# Patient Record
Sex: Female | Born: 1940
Health system: Southern US, Community
[De-identification: ages and names within clinical notes are randomized; demographics above are authoritative.]

## PROBLEM LIST (undated history)

## (undated) DIAGNOSIS — E785 Hyperlipidemia, unspecified: Secondary | ICD-10-CM

## (undated) DIAGNOSIS — K219 Gastro-esophageal reflux disease without esophagitis: Secondary | ICD-10-CM

## (undated) DIAGNOSIS — E669 Obesity, unspecified: Secondary | ICD-10-CM

## (undated) DIAGNOSIS — M1990 Unspecified osteoarthritis, unspecified site: Secondary | ICD-10-CM

## (undated) DIAGNOSIS — C801 Malignant (primary) neoplasm, unspecified: Secondary | ICD-10-CM

## (undated) DIAGNOSIS — I10 Essential (primary) hypertension: Secondary | ICD-10-CM

## (undated) DIAGNOSIS — H409 Unspecified glaucoma: Secondary | ICD-10-CM

## (undated) DIAGNOSIS — E039 Hypothyroidism, unspecified: Secondary | ICD-10-CM

## (undated) DIAGNOSIS — H269 Unspecified cataract: Secondary | ICD-10-CM

## (undated) HISTORY — DX: Obesity, unspecified: E66.9

## (undated) HISTORY — DX: Hyperlipidemia, unspecified: E78.5

## (undated) HISTORY — DX: Unspecified osteoarthritis, unspecified site: M19.90

## (undated) HISTORY — PX: THYROIDECTOMY, PARTIAL: SHX18

## (undated) HISTORY — DX: Gastro-esophageal reflux disease without esophagitis: K21.9

## (undated) HISTORY — DX: Unspecified cataract: H26.9

## (undated) HISTORY — DX: Hypothyroidism, unspecified: E03.9

## (undated) HISTORY — DX: Unspecified glaucoma: H40.9

## (undated) HISTORY — DX: Essential (primary) hypertension: I10

## (undated) HISTORY — PX: APPENDECTOMY: SHX54

## (undated) HISTORY — PX: CHOLECYSTECTOMY: SHX55

---

## 2001-06-16 ENCOUNTER — Ambulatory Visit (HOSPITAL_COMMUNITY): Admission: RE | Admit: 2001-06-16 | Discharge: 2001-06-16 | Payer: Self-pay | Admitting: Internal Medicine

## 2001-06-16 ENCOUNTER — Encounter: Payer: Self-pay | Admitting: Internal Medicine

## 2004-08-21 ENCOUNTER — Ambulatory Visit (HOSPITAL_COMMUNITY): Admission: RE | Admit: 2004-08-21 | Discharge: 2004-08-21 | Payer: Self-pay | Admitting: Internal Medicine

## 2007-10-10 ENCOUNTER — Ambulatory Visit (HOSPITAL_COMMUNITY): Admission: RE | Admit: 2007-10-10 | Discharge: 2007-10-10 | Payer: Self-pay | Admitting: Internal Medicine

## 2009-08-26 ENCOUNTER — Ambulatory Visit: Payer: Self-pay | Admitting: Orthopedic Surgery

## 2009-08-26 DIAGNOSIS — M7660 Achilles tendinitis, unspecified leg: Secondary | ICD-10-CM | POA: Insufficient documentation

## 2009-08-27 ENCOUNTER — Encounter: Payer: Self-pay | Admitting: Orthopedic Surgery

## 2009-09-10 ENCOUNTER — Encounter (HOSPITAL_COMMUNITY): Admission: RE | Admit: 2009-09-10 | Discharge: 2009-10-10 | Payer: Self-pay | Admitting: Orthopedic Surgery

## 2009-09-22 ENCOUNTER — Encounter: Payer: Self-pay | Admitting: Orthopedic Surgery

## 2009-11-26 ENCOUNTER — Encounter: Payer: Self-pay | Admitting: Orthopedic Surgery

## 2010-03-10 NOTE — Letter (Signed)
Summary: *Orthopedic Consult Note  [facility]  [address] [place] [address]  [place], [place] [address]   Phone: [phone]  Fax: [phone]    Re:    Kim Mills DOB:    [birth year]   Dear: DR Felecia Shelling   Thank you for requesting that we see the above patient for consultation.  A copy of the detailed office note will be sent under separate cover, for your review.  Evaluation today is consistent with:  1)  ACHILLES TENDINITIS, BILATERAL (ICD-726.71)     Our recommendation is for: Physical therapy        Thank you for this opportunity to look after your patient.  Sincerely,   Terrance Mass. MD.

## 2010-03-10 NOTE — Letter (Signed)
Summary: History form  History form   Imported By: Jacklynn Ganong 08/28/2009 10:19:06  _____________________________________________________________________  External Attachment:    Type:   Image     Comment:   External Document

## 2010-03-10 NOTE — Miscellaneous (Signed)
Summary: PT clinical evaluation  PT clinical evaluation   Imported By: Jacklynn Ganong 09/22/2009 10:16:53  _____________________________________________________________________  External Attachment:    Type:   Image     Comment:   External Document

## 2010-03-10 NOTE — Miscellaneous (Signed)
Summary: Physical therapy order  Physical therapy order   Imported By: Cammie Sickle 08/28/2009 08:43:28  _____________________________________________________________________  External Attachment:    Type:   Image     Comment:   External Document

## 2010-03-10 NOTE — Assessment & Plan Note (Signed)
Summary: EVAL/TREAT TENDINITIS RT LEG/NEEDS XRAY/REF [name]/MEDICARE,MU...   Visit Type:  new patient Referring Provider:  Dr. Felecia Shelling   History of Present Illness: I saw Kim Mills in the office today for an initial visit.  She is a 70 years old woman with the complaint of:  her swelling behind her ankles.  She says that she does not have any pain. Really she does get some tenderness in her Achilles tendon at times. She is wondering if her Achilles tendon issues contribute to her inability to stand, which is related to her lumbar spine disease. She's had difficulty for several months. She denies any trauma.    Medications: Synthroid, Metformin 500mg , Aspirin, Lipitor 10mg , Actos 30mg , Naproxen 500 mg as needed.  Allergies (verified): No Known Drug Allergies  Past History:  Past Medical History: Diabetes Arthritis Thyroid  Past Surgical History: Thyroid  Review of Systems Constitutional:  Complains of fatigue; denies weight loss, weight gain, fever, and chills. Cardiovascular:  Denies chest pain, palpitations, fainting, and murmurs. Respiratory:  Complains of snoring; denies short of breath, wheezing, couch, tightness, pain on inspiration, and snoring . Gastrointestinal:  Complains of constipation; denies heartburn, nausea, vomiting, diarrhea, and blood in your stools. Genitourinary:  Complains of frequency and urgency; denies difficulty urinating, painful urination, flank pain, and bleeding in urine. Neurologic:  Denies numbness, tingling, unsteady gait, dizziness, tremors, and seizure. Musculoskeletal:  Complains of joint pain; denies swelling, instability, stiffness, redness, heat, and muscle pain. Endocrine:  Denies excessive thirst, exessive urination, and heat or cold intolerance. Psychiatric:  Denies nervousness, depression, anxiety, and hallucinations. Skin:  Denies changes in the skin, poor healing, rash, itching, and redness. HEENT:  Denies blurred or double vision,  eye pain, redness, and watering. Immunology:  Denies seasonal allergies, sinus problems, and allergic to bee stings. Hemoatologic:  Denies easy bleeding and brusing.  Physical Exam  Msk:  Development grooming hygiene, M. Medium to large frame.  Height 53 weight 224 pounds.   Pulses:  The pulses and perfusion were normal with normal color, temperature  and no swelling  Extremities:  Bilateral ankle examination show fairly rigid pes planus. No tenderness over the posterior tibial tendon. Nodularity and swelling, but tenderness in both Achilles tendon, consistent with the watershed area with some mild insertional tenderness as well. She has decreased range of motion in both ankles as well. Both ankles are stable. Strength is normal. Neurologic:  The coordination and sensation were normal  The reflexes were normal   Skin:  intact without lesions or rashes Psych:  alert and cooperative; normal mood and affect; normal attention span and concentration   Impression & Recommendations:  Problem # 1:  ACHILLES TENDINITIS, BILATERAL (ICD-726.71) Assessment New  Orders: New Patient Level II (04540)  Patient Instructions: 1)  ACHILLES TENDONITIS  2)  ADVISE 6 WEEK S PT  3)  Please schedule a follow-up appointment as needed.

## 2010-03-10 NOTE — Miscellaneous (Signed)
Summary: Discharge from PT  Discharge from PT   Imported By: Jacklynn Ganong 11/26/2009 14:25:29  _____________________________________________________________________  External Attachment:    Type:   Image     Comment:   External Document

## 2010-09-08 ENCOUNTER — Encounter: Payer: Self-pay | Admitting: Cardiology

## 2010-09-08 ENCOUNTER — Ambulatory Visit: Payer: Self-pay | Admitting: Cardiology

## 2010-09-15 ENCOUNTER — Ambulatory Visit (INDEPENDENT_AMBULATORY_CARE_PROVIDER_SITE_OTHER): Payer: Medicare Other | Admitting: Cardiology

## 2010-09-15 ENCOUNTER — Encounter: Payer: Self-pay | Admitting: Cardiology

## 2010-09-15 DIAGNOSIS — I10 Essential (primary) hypertension: Secondary | ICD-10-CM

## 2010-09-15 DIAGNOSIS — E119 Type 2 diabetes mellitus without complications: Secondary | ICD-10-CM

## 2010-09-15 DIAGNOSIS — M1710 Unilateral primary osteoarthritis, unspecified knee: Secondary | ICD-10-CM | POA: Insufficient documentation

## 2010-09-15 DIAGNOSIS — M129 Arthropathy, unspecified: Secondary | ICD-10-CM

## 2010-09-15 DIAGNOSIS — R079 Chest pain, unspecified: Secondary | ICD-10-CM

## 2010-09-15 DIAGNOSIS — E782 Mixed hyperlipidemia: Secondary | ICD-10-CM

## 2010-09-15 DIAGNOSIS — E039 Hypothyroidism, unspecified: Secondary | ICD-10-CM

## 2010-09-15 DIAGNOSIS — E785 Hyperlipidemia, unspecified: Secondary | ICD-10-CM

## 2010-09-15 DIAGNOSIS — E669 Obesity, unspecified: Secondary | ICD-10-CM | POA: Insufficient documentation

## 2010-09-15 DIAGNOSIS — M1990 Unspecified osteoarthritis, unspecified site: Secondary | ICD-10-CM

## 2010-09-15 MED ORDER — OMEPRAZOLE 40 MG PO CPDR: 40.0000 mg | DELAYED_RELEASE_CAPSULE | Freq: Every day | ORAL | Status: DC

## 2010-09-15 MED ORDER — SIMVASTATIN 40 MG PO TABS: 40.0000 mg | ORAL_TABLET | Freq: Every day | ORAL | Status: DC

## 2010-09-15 NOTE — Patient Instructions (Signed)
**Note De-Identified  Obfuscation** Your physician has requested that you have en exercise stress myoview. For further information please visit [email or web address] Please follow instruction sheet, as given.  Your physician has recommended you make the following change in your medication: complete current bottle of Lipitor then swith to Simvastatin 40 mg at bedtime and statrt taking Omeprazole 40 mg daily  Your physician recommends that you gradually increase activity  Your physician recommends that you return for lab work in: 2 months  Your physician recommends that you schedule a follow-up appointment in: 3 weeks

## 2010-09-18 ENCOUNTER — Encounter: Payer: Self-pay | Admitting: Cardiology

## 2010-09-18 DIAGNOSIS — E785 Hyperlipidemia, unspecified: Secondary | ICD-10-CM | POA: Insufficient documentation

## 2010-09-18 NOTE — Progress Notes (Signed)
HPI:  Kim Mills is seen in consultation in the office today at the kind request of Dr. Felecia Shelling for evaluation of chest discomfort.   This nice woman has multiple cardiovascular risk factors but no known coronary disease.  She now presents with a turning sensation in the midsternal region unrelated to exertion.  Symptoms occur unpredictably with no clear relationship to body position, time of day, activity or anxiety.  There is associated dyspnea and weakness, but no diaphoresis nor nausea.  She has had no pedal edema, orthopnea, PND or substantial weight gain.  Current Outpatient Prescriptions on File Prior to Visit  Medication Sig Dispense Refill  . omeprazole (PRILOSEC) 40 MG capsule Take 1 capsule (40 mg total) by mouth daily.  30 capsule  3  . simvastatin (ZOCOR) 40 MG tablet Take 1 tablet (40 mg total) by mouth at bedtime.  30 tablet  3    No Known Allergies    Past Medical History  Diagnosis Date  . Diabetes mellitus     A1c of 7.4 in 08/2010  . Arthritis   . Hypothyroidism   . Chest pain     Associated with weakness and fatigue  . Obesity   . Hypertension     Lipid profile in 08/2010:190, 121, 48, 118; normal CBC and CMet     No past surgical history on file.   No family history on file.   History   Social History  . Marital Status: Widowed    Spouse Name: N/A    Number of Children: N/A  . Years of Education: N/A   Occupational History  . Not on file.   Social History Main Topics  . Smoking status: Never Smoker   . Smokeless tobacco: Never Used  . Alcohol Use: No  . Drug Use: No  . Sexually Active: Not on file   Other Topics Concern  . Not on file   Social History Narrative  . No narrative on file     ROS:  Occasional headaches; requires corrective lenses; has been informed of early cataract formation; modest hearing lowers; upper and lower dentures; remote history of peptic ulcer disease; current gastroesophageal reflux disease symptoms; urinary frequency;  diffuse arthritic discomfort; occasional pedal edema.   All other systems reviewed and are negative.  PHYSICAL EXAM: BP 156/90  Pulse 85  Ht 5\' 4"  (1.626 m)  Wt 97.523 kg (215 lb)  BMI 36.90 kg/m2  SpO2 97%; weight is 20 pounds decreased since last assessment.  General-Well-developed; no acute distress Body Habitus-Moderately overweight HEENT-[place]/AT; PERRL; EOM intact; conjunctiva and lids nl; nearly edentulous Neck-No JVD; no carotid bruits Endocrine-thyromegaly, more prominent on the right; surgical scar at the base of the neck Lungs-Clear lung fields; resonant percussion; normal I-to-E ratio Cardiovascular- normal PMI; normal S1 and S2; minimal systolic murmur Abdomen-BS normal; soft and non-tender without masses or organomegaly Musculoskeletal-No deformities, cyanosis or clubbing Neurologic-Nl cranial nerves; symmetric strength and tone Skin- Warm, no significant lesions Extremities-Nl distal pulses; no edema  EKG:  Normal sinus rhythm; delayed R-wave progression; borderline low voltage in the chest leads; nonspecific T-wave abnormality; no previous tracing for comparison.  ASSESSMENT AND PLAN:

## 2010-09-18 NOTE — Assessment & Plan Note (Signed)
Chest pain is atypical, but certainly could represent myocardial ischemia.  A stress nuclear study will be performed for further evaluation.

## 2010-09-18 NOTE — Assessment & Plan Note (Signed)
Control of her hyperlipidemia should be optimal in the setting of diabetes.  A lipid profile will be obtained on current therapy in 2 months.

## 2010-09-24 ENCOUNTER — Ambulatory Visit (INDEPENDENT_AMBULATORY_CARE_PROVIDER_SITE_OTHER): Payer: Medicare Other | Admitting: *Deleted

## 2010-09-24 ENCOUNTER — Encounter (HOSPITAL_COMMUNITY)
Admission: RE | Admit: 2010-09-24 | Discharge: 2010-09-24 | Disposition: A | Discharge status: Home / Self Care | Payer: Medicare Other | Source: Ambulatory Visit | Attending: Cardiology | Admitting: Cardiology

## 2010-09-24 ENCOUNTER — Encounter (HOSPITAL_COMMUNITY): Payer: Self-pay | Admitting: Cardiology

## 2010-09-24 DIAGNOSIS — I10 Essential (primary) hypertension: Secondary | ICD-10-CM | POA: Insufficient documentation

## 2010-09-24 DIAGNOSIS — E669 Obesity, unspecified: Secondary | ICD-10-CM | POA: Insufficient documentation

## 2010-09-24 DIAGNOSIS — E119 Type 2 diabetes mellitus without complications: Secondary | ICD-10-CM | POA: Insufficient documentation

## 2010-09-24 DIAGNOSIS — R079 Chest pain, unspecified: Secondary | ICD-10-CM

## 2010-09-24 DIAGNOSIS — R0789 Other chest pain: Secondary | ICD-10-CM | POA: Insufficient documentation

## 2010-09-24 MED ORDER — TECHNETIUM TC 99M TETROFOSMIN IV KIT
10.0000 | PACK | Freq: Once | INTRAVENOUS | Status: AC | PRN
  Administered 2010-09-24: 9 via INTRAVENOUS

## 2010-09-24 MED ORDER — TECHNETIUM TC 99M TETROFOSMIN IV KIT
30.0000 | PACK | Freq: Once | INTRAVENOUS | Status: AC | PRN
  Administered 2010-09-24: 31 via INTRAVENOUS

## 2010-09-24 NOTE — Progress Notes (Signed)
Stress Lab Nurses Notes - Kim Mills  Kim Mills 09/24/2010  Reason for doing test: Chest Pain  Type of test: Stress Myoview  Nurse performing test: Parke Poisson, RN  Nuclear Medicine Tech: Lou Cal  Echo Tech: Not Applicable  MD performing test: Ival Bible. MD & [name]. NP  Family MD: [name]  Test explained and consent signed: yes  IV started: 22g jelco, Saline lock flushed, No redness or edema and Saline lock started in radiology  Symptoms: SOB and Chest pain  Treatment/Intervention: None  Reason test stopped: chest pain and fatigue  After recovery IV was: Discontinued via X-ray tech and No redness or edema  Patient to return to Nuc. Med at :12:15pm Patient discharged: Home  Patient's Condition upon discharge was: stable  Comments: Recovery BP 138/68 & HR 88. Peak exercise BP 228/88 & HR 164 . Symptoms resolved in recovery.  Erskine Speed T

## 2010-10-01 ENCOUNTER — Telehealth: Payer: Self-pay | Admitting: *Deleted

## 2010-10-01 NOTE — Telephone Encounter (Signed)
Stress test and office note sent to [facility] for review Will call pt with further recommendations

## 2010-10-02 ENCOUNTER — Encounter: Payer: Self-pay | Admitting: *Deleted

## 2010-10-02 NOTE — Telephone Encounter (Signed)
Abnormal stress test No further testing per Zonia Kief Left message on pts machine

## 2010-10-08 ENCOUNTER — Encounter: Payer: Self-pay | Admitting: Cardiology

## 2010-10-09 ENCOUNTER — Ambulatory Visit: Payer: Medicare Other | Admitting: Cardiology

## 2010-10-09 ENCOUNTER — Encounter: Payer: Self-pay | Admitting: Cardiology

## 2010-10-09 ENCOUNTER — Ambulatory Visit (INDEPENDENT_AMBULATORY_CARE_PROVIDER_SITE_OTHER): Payer: Medicare Other | Admitting: Cardiology

## 2010-10-09 DIAGNOSIS — E785 Hyperlipidemia, unspecified: Secondary | ICD-10-CM

## 2010-10-09 DIAGNOSIS — I10 Essential (primary) hypertension: Secondary | ICD-10-CM

## 2010-10-09 DIAGNOSIS — R079 Chest pain, unspecified: Secondary | ICD-10-CM

## 2010-10-09 DIAGNOSIS — E119 Type 2 diabetes mellitus without complications: Secondary | ICD-10-CM

## 2010-10-09 NOTE — Patient Instructions (Addendum)
**Note De-Identified  Obfuscation** Your physician recommends that you return for lab work in: next week  Your physician encouraged you to lose weight for better health.  Your physician recommends that you continue on your current medications as directed. Please refer to the Current Medication list given to you today.   Your physician recommends that you schedule a follow-up appointment in: 6 month

## 2010-10-14 NOTE — Progress Notes (Signed)
HPI : Kim Mills returns to the office for further assessment and treatment of chest discomfort.  Since her last visit, she has done well without significant symptoms.  Current Outpatient Prescriptions on File Prior to Visit  Medication Sig Dispense Refill  . aspirin 81 MG tablet Take 81 mg by mouth daily.        Marland Kitchen glyBURIDE-metformin (GLUCOVANCE) 2.5-500 MG per tablet Take 1 tablet by mouth 2 (two) times daily with a meal.       . omeprazole (PRILOSEC) 40 MG capsule Take 1 capsule (40 mg total) by mouth daily.  30 capsule  3  . simvastatin (ZOCOR) 40 MG tablet Take 1 tablet (40 mg total) by mouth at bedtime.  30 tablet  3  . SYNTHROID 175 MCG tablet Take 175 mcg by mouth daily.          No Known Allergies    Past medical history, social history, and family history reviewed and updated.  ROS: Denies orthopnea, PND, chest discomfort, dyspnea, lightheadedness or syncope.  No cough nor sputum production.  PHYSICAL EXAM: BP 156/79  Pulse 89  Ht 5\' 3"  (1.6 m)  Wt 97.523 kg (215 lb)  BMI 38.09 kg/m2  SpO2 97%  General-Well developed; no acute distress HEENT: Multiple extractions Body habitus-obese Neck-No JVD; no carotid bruits Lungs-clear lung fields; resonant to percussion Cardiovascular-normal PMI; normal S1 and S2 Abdomen-normal bowel sounds; soft and non-tender without masses or organomegaly Musculoskeletal-No deformities, no cyanosis or clubbing Neurologic-Normal cranial nerves; symmetric strength and tone Skin-Warm, no significant lesions Extremities-distal pulses intact; no edema  ASSESSMENT AND PLAN:

## 2010-10-16 ENCOUNTER — Other Ambulatory Visit: Payer: Self-pay | Admitting: Cardiology

## 2010-10-16 ENCOUNTER — Encounter: Payer: Self-pay | Admitting: Cardiology

## 2010-10-16 NOTE — Assessment & Plan Note (Signed)
Stress nuclear study was negative in terms of perfusion imaging.  ST Segment depression was seen as well as a hypertensive blood pressure response.  This is reassuring in terms of the absence of significant myocardial ischemia.  We will continue treatment of hypertension and deferred further testing while we observe for the development of recurrent or additional symptoms.

## 2010-10-16 NOTE — Assessment & Plan Note (Signed)
Blood pressure control is elevated in the office today.  Patient will monitor at home, and adjustments in therapy will be made at her next visit if necessary.

## 2010-10-16 NOTE — Assessment & Plan Note (Signed)
Serum lipids will be reassessed and therapy adjusted as necessary.

## 2010-10-17 LAB — LIPID PANEL
Cholesterol: 184 mg/dL (ref 0–200)
HDL: 42 mg/dL (ref >39)
LDL Cholesterol: 116 mg/dL — ABNORMAL HIGH (ref 0–99)
Total CHOL/HDL Ratio: 4.4 Ratio
Triglycerides: 131 mg/dL (ref <150)
VLDL: 26 mg/dL (ref 0–40)

## 2010-10-17 LAB — TSH: TSH: 1.33 u[IU]/mL (ref 0.350–4.500)

## 2010-10-28 ENCOUNTER — Telehealth: Payer: Self-pay

## 2010-10-28 DIAGNOSIS — E785 Hyperlipidemia, unspecified: Secondary | ICD-10-CM

## 2010-10-28 NOTE — Telephone Encounter (Signed)
**Note De-Identified Sania Noy Obfuscation** Message copied by Demetrios Loll on Wed Oct 28, 2010 10:37 AM ------      Message from: Kathlen Brunswick      Created: Thu Oct 22, 2010 10:22 AM       Suboptimal control of hyperlipidemia.      DC simvastatin.      Atorvastatin 80 mg q.d.      Fasting lipid profile and CMet in one month.

## 2010-11-02 ENCOUNTER — Other Ambulatory Visit: Payer: Self-pay

## 2010-11-02 ENCOUNTER — Telehealth: Payer: Self-pay

## 2010-11-02 DIAGNOSIS — E785 Hyperlipidemia, unspecified: Secondary | ICD-10-CM

## 2010-11-02 MED ORDER — LIPITOR 80 MG PO TABS: 80.0000 mg | ORAL_TABLET | Freq: Every day | ORAL | Status: DC

## 2010-11-02 NOTE — Telephone Encounter (Signed)
Message from: Kathlen Brunswick  Created: Thu Oct 22, 2010 10:22 AM  Suboptimal control of hyperlipidemia.  DC simvastatin.  Atorvastatin 80 mg q.d.  Fasting lipid profile and CMet in one month.  Pt. advised and she states she understands instructions given. Pt. states that she can not afford to have labs repeated in 1 month due to she had them drawn earlier this month and insurance will only pay for twice yearly. Also, pt. states that she must have brand name Lipitor as she cant take generic brand. Lipitor ordered./LV

## 2010-11-02 NOTE — Telephone Encounter (Signed)
**Note De-Identified  Obfuscation** Pt. advised, she states she understands instructions given. RX faxed to [facility] pharmacy and lab orders mailed to pt's address./LV

## 2010-11-27 NOTE — Progress Notes (Signed)
**Note De-Identified  Obfuscation** Pt. states that her PCP, Dr Felecia Shelling, advised her that she did not need this lab work performed and also pt. states that Dr. Felecia Shelling will refill her cholesterol medication in the future.

## 2011-02-12 DIAGNOSIS — R7309 Other abnormal glucose: Secondary | ICD-10-CM | POA: Diagnosis not present

## 2011-02-12 DIAGNOSIS — E119 Type 2 diabetes mellitus without complications: Secondary | ICD-10-CM | POA: Diagnosis not present

## 2011-02-12 DIAGNOSIS — M1990 Unspecified osteoarthritis, unspecified site: Secondary | ICD-10-CM | POA: Diagnosis not present

## 2011-02-12 DIAGNOSIS — E039 Hypothyroidism, unspecified: Secondary | ICD-10-CM | POA: Diagnosis not present

## 2011-02-12 DIAGNOSIS — E1365 Other specified diabetes mellitus with hyperglycemia: Secondary | ICD-10-CM | POA: Diagnosis not present

## 2011-02-12 DIAGNOSIS — E7800 Pure hypercholesterolemia, unspecified: Secondary | ICD-10-CM | POA: Diagnosis not present

## 2011-02-12 DIAGNOSIS — I10 Essential (primary) hypertension: Secondary | ICD-10-CM | POA: Diagnosis not present

## 2011-02-12 DIAGNOSIS — Z0000 Encounter for general adult medical examination without abnormal findings: Secondary | ICD-10-CM | POA: Diagnosis not present

## 2011-04-09 DIAGNOSIS — H40059 Ocular hypertension, unspecified eye: Secondary | ICD-10-CM | POA: Diagnosis not present

## 2011-04-09 DIAGNOSIS — H2589 Other age-related cataract: Secondary | ICD-10-CM | POA: Diagnosis not present

## 2011-04-09 DIAGNOSIS — E119 Type 2 diabetes mellitus without complications: Secondary | ICD-10-CM | POA: Diagnosis not present

## 2011-05-14 DIAGNOSIS — J329 Chronic sinusitis, unspecified: Secondary | ICD-10-CM | POA: Diagnosis not present

## 2011-05-14 DIAGNOSIS — E7800 Pure hypercholesterolemia, unspecified: Secondary | ICD-10-CM | POA: Diagnosis not present

## 2011-05-14 DIAGNOSIS — E119 Type 2 diabetes mellitus without complications: Secondary | ICD-10-CM | POA: Diagnosis not present

## 2011-05-14 DIAGNOSIS — I10 Essential (primary) hypertension: Secondary | ICD-10-CM | POA: Diagnosis not present

## 2011-08-16 DIAGNOSIS — E119 Type 2 diabetes mellitus without complications: Secondary | ICD-10-CM | POA: Diagnosis not present

## 2011-08-16 DIAGNOSIS — R5383 Other fatigue: Secondary | ICD-10-CM | POA: Diagnosis not present

## 2011-08-16 DIAGNOSIS — E039 Hypothyroidism, unspecified: Secondary | ICD-10-CM | POA: Diagnosis not present

## 2011-08-16 DIAGNOSIS — I10 Essential (primary) hypertension: Secondary | ICD-10-CM | POA: Diagnosis not present

## 2011-08-16 DIAGNOSIS — R5381 Other malaise: Secondary | ICD-10-CM | POA: Diagnosis not present

## 2011-08-16 DIAGNOSIS — E559 Vitamin D deficiency, unspecified: Secondary | ICD-10-CM | POA: Diagnosis not present

## 2011-12-06 DIAGNOSIS — I10 Essential (primary) hypertension: Secondary | ICD-10-CM | POA: Diagnosis not present

## 2011-12-06 DIAGNOSIS — E119 Type 2 diabetes mellitus without complications: Secondary | ICD-10-CM | POA: Diagnosis not present

## 2011-12-06 DIAGNOSIS — E039 Hypothyroidism, unspecified: Secondary | ICD-10-CM | POA: Diagnosis not present

## 2011-12-06 DIAGNOSIS — E559 Vitamin D deficiency, unspecified: Secondary | ICD-10-CM | POA: Diagnosis not present

## 2011-12-09 DIAGNOSIS — E119 Type 2 diabetes mellitus without complications: Secondary | ICD-10-CM | POA: Diagnosis not present

## 2012-03-06 ENCOUNTER — Other Ambulatory Visit (HOSPITAL_COMMUNITY): Payer: Self-pay | Admitting: Internal Medicine

## 2012-03-06 ENCOUNTER — Ambulatory Visit (HOSPITAL_COMMUNITY)
Admission: RE | Admit: 2012-03-06 | Discharge: 2012-03-06 | Disposition: A | Discharge status: Home / Self Care | Payer: Medicare Other | Source: Ambulatory Visit | Attending: Internal Medicine | Admitting: Internal Medicine

## 2012-03-06 DIAGNOSIS — M25511 Pain in right shoulder: Secondary | ICD-10-CM

## 2012-03-06 DIAGNOSIS — E119 Type 2 diabetes mellitus without complications: Secondary | ICD-10-CM | POA: Diagnosis not present

## 2012-03-06 DIAGNOSIS — M25512 Pain in left shoulder: Secondary | ICD-10-CM

## 2012-03-06 DIAGNOSIS — M25519 Pain in unspecified shoulder: Secondary | ICD-10-CM | POA: Insufficient documentation

## 2012-03-06 DIAGNOSIS — M1990 Unspecified osteoarthritis, unspecified site: Secondary | ICD-10-CM | POA: Diagnosis not present

## 2012-03-06 DIAGNOSIS — I10 Essential (primary) hypertension: Secondary | ICD-10-CM | POA: Diagnosis not present

## 2012-03-06 DIAGNOSIS — M19019 Primary osteoarthritis, unspecified shoulder: Secondary | ICD-10-CM | POA: Diagnosis not present

## 2012-04-03 DIAGNOSIS — M67919 Unspecified disorder of synovium and tendon, unspecified shoulder: Secondary | ICD-10-CM | POA: Diagnosis not present

## 2012-04-03 DIAGNOSIS — R29898 Other symptoms and signs involving the musculoskeletal system: Secondary | ICD-10-CM | POA: Diagnosis not present

## 2012-04-03 DIAGNOSIS — M25519 Pain in unspecified shoulder: Secondary | ICD-10-CM | POA: Diagnosis not present

## 2012-07-24 DIAGNOSIS — I10 Essential (primary) hypertension: Secondary | ICD-10-CM | POA: Diagnosis not present

## 2012-07-24 DIAGNOSIS — E119 Type 2 diabetes mellitus without complications: Secondary | ICD-10-CM | POA: Diagnosis not present

## 2012-07-24 DIAGNOSIS — E7800 Pure hypercholesterolemia, unspecified: Secondary | ICD-10-CM | POA: Diagnosis not present

## 2012-07-24 DIAGNOSIS — J329 Chronic sinusitis, unspecified: Secondary | ICD-10-CM | POA: Diagnosis not present

## 2012-07-24 DIAGNOSIS — E039 Hypothyroidism, unspecified: Secondary | ICD-10-CM | POA: Diagnosis not present

## 2012-09-05 DIAGNOSIS — N390 Urinary tract infection, site not specified: Secondary | ICD-10-CM | POA: Diagnosis not present

## 2012-09-05 DIAGNOSIS — E119 Type 2 diabetes mellitus without complications: Secondary | ICD-10-CM | POA: Diagnosis not present

## 2013-01-01 DIAGNOSIS — IMO0001 Reserved for inherently not codable concepts without codable children: Secondary | ICD-10-CM | POA: Diagnosis not present

## 2013-01-01 DIAGNOSIS — I10 Essential (primary) hypertension: Secondary | ICD-10-CM | POA: Diagnosis not present

## 2013-01-01 DIAGNOSIS — E119 Type 2 diabetes mellitus without complications: Secondary | ICD-10-CM | POA: Diagnosis not present

## 2013-01-01 DIAGNOSIS — E559 Vitamin D deficiency, unspecified: Secondary | ICD-10-CM | POA: Diagnosis not present

## 2013-01-01 DIAGNOSIS — E039 Hypothyroidism, unspecified: Secondary | ICD-10-CM | POA: Diagnosis not present

## 2013-01-01 DIAGNOSIS — C9590 Leukemia, unspecified not having achieved remission: Secondary | ICD-10-CM | POA: Diagnosis not present

## 2013-04-19 DIAGNOSIS — E119 Type 2 diabetes mellitus without complications: Secondary | ICD-10-CM | POA: Diagnosis not present

## 2013-04-19 DIAGNOSIS — E7800 Pure hypercholesterolemia, unspecified: Secondary | ICD-10-CM | POA: Diagnosis not present

## 2013-04-19 DIAGNOSIS — I10 Essential (primary) hypertension: Secondary | ICD-10-CM | POA: Diagnosis not present

## 2013-07-10 DIAGNOSIS — E119 Type 2 diabetes mellitus without complications: Secondary | ICD-10-CM | POA: Diagnosis not present

## 2013-07-10 DIAGNOSIS — H524 Presbyopia: Secondary | ICD-10-CM | POA: Diagnosis not present

## 2013-07-10 DIAGNOSIS — H40029 Open angle with borderline findings, high risk, unspecified eye: Secondary | ICD-10-CM | POA: Diagnosis not present

## 2013-07-10 DIAGNOSIS — H35039 Hypertensive retinopathy, unspecified eye: Secondary | ICD-10-CM | POA: Diagnosis not present

## 2013-08-09 DIAGNOSIS — IMO0001 Reserved for inherently not codable concepts without codable children: Secondary | ICD-10-CM | POA: Diagnosis not present

## 2013-08-09 DIAGNOSIS — E669 Obesity, unspecified: Secondary | ICD-10-CM | POA: Diagnosis not present

## 2013-08-09 DIAGNOSIS — M1990 Unspecified osteoarthritis, unspecified site: Secondary | ICD-10-CM | POA: Diagnosis not present

## 2013-08-09 DIAGNOSIS — E65 Localized adiposity: Secondary | ICD-10-CM | POA: Diagnosis not present

## 2013-08-09 DIAGNOSIS — I10 Essential (primary) hypertension: Secondary | ICD-10-CM | POA: Diagnosis not present

## 2013-08-09 DIAGNOSIS — E039 Hypothyroidism, unspecified: Secondary | ICD-10-CM | POA: Diagnosis not present

## 2013-08-17 DIAGNOSIS — H04129 Dry eye syndrome of unspecified lacrimal gland: Secondary | ICD-10-CM | POA: Diagnosis not present

## 2013-08-17 DIAGNOSIS — H2510 Age-related nuclear cataract, unspecified eye: Secondary | ICD-10-CM | POA: Diagnosis not present

## 2013-08-17 DIAGNOSIS — E1139 Type 2 diabetes mellitus with other diabetic ophthalmic complication: Secondary | ICD-10-CM | POA: Diagnosis not present

## 2013-08-17 DIAGNOSIS — H4010X0 Unspecified open-angle glaucoma, stage unspecified: Secondary | ICD-10-CM | POA: Diagnosis not present

## 2013-08-17 DIAGNOSIS — H25019 Cortical age-related cataract, unspecified eye: Secondary | ICD-10-CM | POA: Diagnosis not present

## 2013-09-10 DIAGNOSIS — H8110 Benign paroxysmal vertigo, unspecified ear: Secondary | ICD-10-CM | POA: Diagnosis not present

## 2013-09-27 DIAGNOSIS — H8110 Benign paroxysmal vertigo, unspecified ear: Secondary | ICD-10-CM | POA: Diagnosis not present

## 2013-10-04 DIAGNOSIS — H4010X0 Unspecified open-angle glaucoma, stage unspecified: Secondary | ICD-10-CM | POA: Diagnosis not present

## 2013-11-08 ENCOUNTER — Other Ambulatory Visit (HOSPITAL_COMMUNITY): Payer: Self-pay | Admitting: Internal Medicine

## 2013-11-08 DIAGNOSIS — E119 Type 2 diabetes mellitus without complications: Secondary | ICD-10-CM | POA: Diagnosis not present

## 2013-11-08 DIAGNOSIS — Z1231 Encounter for screening mammogram for malignant neoplasm of breast: Secondary | ICD-10-CM

## 2013-11-08 DIAGNOSIS — E039 Hypothyroidism, unspecified: Secondary | ICD-10-CM | POA: Diagnosis not present

## 2013-11-15 ENCOUNTER — Ambulatory Visit (HOSPITAL_COMMUNITY)
Admission: RE | Admit: 2013-11-15 | Discharge: 2013-11-15 | Disposition: A | Discharge status: Home / Self Care | Payer: Medicare Other | Source: Ambulatory Visit | Attending: Internal Medicine | Admitting: Internal Medicine

## 2013-11-15 DIAGNOSIS — Z1231 Encounter for screening mammogram for malignant neoplasm of breast: Secondary | ICD-10-CM | POA: Diagnosis not present

## 2013-11-20 ENCOUNTER — Telehealth: Payer: Self-pay

## 2013-11-20 NOTE — Telephone Encounter (Signed)
PATIENT CALLED TO SCHEDULE COLONOSCOPY [phone] OR [phone]

## 2013-11-27 DIAGNOSIS — H4011X1 Primary open-angle glaucoma, mild stage: Secondary | ICD-10-CM | POA: Diagnosis not present

## 2013-12-19 NOTE — Telephone Encounter (Signed)
Per Eldon, pt to call back to schedule and maybe she will call in Jan.

## 2013-12-25 ENCOUNTER — Telehealth: Payer: Self-pay

## 2013-12-25 NOTE — Telephone Encounter (Signed)
Patient called today saying that she has a lot happening in her family right now and would rather wait until the first of the year before scheduling anything. She can be reached at [phone] or [phone]

## 2014-02-15 ENCOUNTER — Telehealth: Payer: Self-pay

## 2014-02-15 NOTE — Telephone Encounter (Signed)
Pt had received a letter awhile back to set up her colonoscopy and wanted to wait awhile before scheduling. She is ready to schedule it now. She can be reached at [phone] or [phone]

## 2014-02-18 NOTE — Telephone Encounter (Signed)
LMOM at [phone] and [phone] is not a working number.

## 2014-02-18 NOTE — Telephone Encounter (Signed)
Pt is scheduled for OV with Walden Field, NP on 03/13/2014 at 2:30 PM.  She said she has always had problems with constipation ( she calls it stubborn bowels).  This will be her first colonoscopy.

## 2014-03-13 ENCOUNTER — Other Ambulatory Visit: Payer: Self-pay

## 2014-03-13 ENCOUNTER — Encounter: Payer: Self-pay | Admitting: Nurse Practitioner

## 2014-03-13 ENCOUNTER — Ambulatory Visit (INDEPENDENT_AMBULATORY_CARE_PROVIDER_SITE_OTHER): Payer: Medicare Other | Admitting: Nurse Practitioner

## 2014-03-13 VITALS — BP 174/81 | HR 90 | Temp 98.0°F | Ht 64.0 in | Wt 202.0 lb

## 2014-03-13 DIAGNOSIS — K5900 Constipation, unspecified: Secondary | ICD-10-CM | POA: Diagnosis not present

## 2014-03-13 DIAGNOSIS — Z1211 Encounter for screening for malignant neoplasm of colon: Secondary | ICD-10-CM | POA: Diagnosis not present

## 2014-03-13 DIAGNOSIS — K219 Gastro-esophageal reflux disease without esophagitis: Secondary | ICD-10-CM

## 2014-03-13 MED ORDER — PEG 3350-KCL-NA BICARB-NACL 420 G PO SOLR: 4000.0000 mL | ORAL | Status: DC

## 2014-03-13 MED ORDER — OMEPRAZOLE 40 MG PO CPDR: 40.0000 mg | DELAYED_RELEASE_CAPSULE | Freq: Every day | ORAL | Status: DC

## 2014-03-13 NOTE — Assessment & Plan Note (Addendum)
Has never had a colonoscopy before, due for screening. Denies any overt GI symptoms other than chronic constipation which she's had a problem with for years. No GI bleed. Questionable weight loss, last comparision office visit is from 2012 which she was 215 lbs and today she is 202lbs. Will proceed with colonosocpy.  Proceed with TCS with Dr. Gala Romney in near future: the risks, benefits, and alternatives have been discussed with the patient in detail. The patient states understanding and desires to proceed.

## 2014-03-13 NOTE — Assessment & Plan Note (Signed)
History of GERD symptoms was well controlled on Prilosec 40 mg daily, ran out of medication and never called for refill. Now having intermittent GERD symptoms. Will refill Prilosec, follow-up in 3 months to re-evaluate.

## 2014-03-13 NOTE — Progress Notes (Signed)
Primary Care Physician:  Rosita Fire, MD Primary Gastroenterologist:  Dr. Gala Romney  Chief Complaint  Patient presents with  . set up TCS    HPI:   74 year old female presents to schedule first colonoscopy. States she also has problems with constipation. Patient was told several years ago that she has "a pocket that causes the feces to get stubborn." Does have issues with constipation, will take laxatives when it gets "too bad." Will use any of a number of agents with the last one being magnesium citrate a couple months ago. Has tried Miralax but states it made her nauseated. When she's constipated she has to strain and the stool is hard. Drinks minimal water in a day, eats minimal fruits and vegetables. Denies hematochezia and melena. Has GERD symptoms. Was on Prilosec but has not taken it in a while, her refill expired. Denies dysphagia. No other upper or lower GI symptoms.  Past Medical History  Diagnosis Date  . Diabetes mellitus     A1c of 7.4 in 08/2010  . Arthritis   . Hypothyroidism   . Chest pain     Associated with weakness and fatigue  . Obesity   . Hypertension     Lipid profile in 08/2010:190, 121, 48, 118; normal CBC and CMet  . Hyperlipidemia     Lipid profile in 08/2010:190, 121, 48, 118; normal CBC and CMet    Past Surgical History  Procedure Laterality Date  . Appendectomy    . Cholecystectomy    . Thyroidectomy, partial      Current Outpatient Prescriptions  Medication Sig Dispense Refill  . aspirin 81 MG tablet Take 81 mg by mouth daily.      Marland Kitchen glyBURIDE-metformin (GLUCOVANCE) 2.5-500 MG per tablet Take 1 tablet by mouth 2 (two) times daily with a meal.     . losartan-hydrochlorothiazide (HYZAAR) 100-12.5 MG per tablet Take 1 tablet by mouth daily.    . naproxen (NAPROSYN) 500 MG tablet Take 500 mg by mouth daily.    . Nutritional Supplements (COLON FORMULA PO) Take 1 capsule by mouth as needed.    Marland Kitchen SYNTHROID 175 MCG tablet Take 175 mcg by mouth daily.      Marland Kitchen LIPITOR 80 MG tablet Take 1 tablet (80 mg total) by mouth daily. 30 tablet 3  . omeprazole (PRILOSEC) 40 MG capsule Take 1 capsule (40 mg total) by mouth daily. 30 capsule 3   No current facility-administered medications for this visit.    Allergies as of 03/13/2014  . (No Known Allergies)    Family History  Problem Relation Age of Onset  . Heart block Mother   . Uterine cancer Sister   . Diabetes Sister     History   Social History  . Marital Status: Widowed    Spouse Name: N/A    Number of Children: N/A  . Years of Education: N/A   Occupational History  . Not on file.   Social History Main Topics  . Smoking status: Never Smoker   . Smokeless tobacco: Never Used  . Alcohol Use: No  . Drug Use: No  . Sexual Activity: Not on file   Other Topics Concern  . Not on file   Social History Narrative    Review of Systems: Gen: Denies any fever, chills, lack of appetite. Admits unintentional weight loss. CV: Denies chest pain, heart palpitations, syncope.  Resp: Denies shortness of breath at rest or with exertion. Denies wheezing.  GI: See HPI. Denies  jaundice, hematemesis, fecal incontinence. MS: Denies joint pain, muscle weakness, cramps, or limitation of movement.  Derm: Denies rash, itching, dry skin Psych: Denies depression, anxiety, memory loss, and confusion Heme: Denies bruising, bleeding, and enlarged lymph nodes.  Physical Exam: BP 174/81 mmHg  Pulse 90  Temp(Src) 98 F (36.7 C)  Ht 5\' 4"  (1.626 m)  Wt 202 lb (91.627 kg)  BMI 34.66 kg/m2 General:   Alert and oriented. Pleasant and cooperative. Well-nourished and well-developed.  Head:  Normocephalic and atraumatic. Eyes:  Without icterus, sclera clear and conjunctiva pink.  Ears:  Normal auditory acuity. Nose:  No deformity, discharge,  or lesions. Mouth:  No deformity or lesions, oral mucosa pink.  Neck:  Supple, without mass or thyromegaly. Lungs:  Clear to auscultation bilaterally. No wheezes,  rales, or rhonchi. No distress.  Heart:  S1, S2 present without murmurs appreciated.  Abdomen:  +BS, soft, non-tender and non-distended. No HSM noted. No guarding or rebound. No masses appreciated.  Rectal:  Deferred  Msk:  Symmetrical without gross deformities. Normal posture. Pulses:  Normal pulses noted. Extremities:  Without clubbing or edema. Neurologic:  Alert and  oriented x4;  grossly normal neurologically. Skin:  Intact without significant lesions or rashes. Cervical Nodes:  No significant cervical adenopathy. Psych:  Alert and cooperative. Normal mood and affect.     03/13/2014 2:52 PM

## 2014-03-13 NOTE — Patient Instructions (Addendum)
1. We will schedule your procedure for you 2. Further recommendations to follow based on the results of your procedure. 3. Try to increase the amount of water and fiber 74fruits and vegetables) in your diet to help with constipation 4. Can also try taking Benefiber to promote regularity. 5. Follow-up in 3 months.   Constipation Constipation is when a person:  Poops (has a bowel movement) less than 3 times a week.  Has a hard time pooping.  Has poop that is dry, hard, or bigger than normal. HOME CARE   Eat foods with a lot of fiber in them. This includes fruits, vegetables, beans, and whole grains such as brown rice.  Avoid fatty foods and foods with a lot of sugar. This includes french fries, hamburgers, cookies, candy, and soda.  If you are not getting enough fiber from food, take products with added fiber in them (supplements).  Drink enough fluid to keep your pee (urine) clear or pale yellow.  Exercise on a regular basis, or as told by your doctor.  Go to the restroom when you feel like you need to poop. Do not hold it.  Only take medicine as told by your doctor. Do not take medicines that help you poop (laxatives) without talking to your doctor first. GET HELP RIGHT AWAY IF:   You have bright red blood in your poop (stool).  Your constipation lasts more than 4 days or gets worse.  You have belly (abdominal) or butt (rectal) pain.  You have thin poop (as thin as a pencil).  You lose weight, and it cannot be explained. MAKE SURE YOU:   Understand these instructions.  Will watch your condition.  Will get help right away if you are not doing well or get worse. Document Released: 07/14/2007 Document Revised: 01/30/2013 Document Reviewed: 11/06/2012 [facility] Patient Information 2015 [place], [place]. This information is not intended to replace advice given to you by your health care provider. Make sure you discuss any questions you have with your health care provider.

## 2014-03-13 NOTE — Assessment & Plan Note (Signed)
Chronic constipation, will take laxatives when needed. Miralax has made her sick to her stomach previously. Minimal water and fiber intake. Recommend increase water and dietary fiber. Try benefiber for increased regularity.

## 2014-03-29 ENCOUNTER — Telehealth: Payer: Self-pay

## 2014-03-29 NOTE — Telephone Encounter (Signed)
Pt called today because her Metformin pill does not a line in it for her to cut it. She also said that she was told if it did not have a line on it she was able to cut the pill. I talked with EG and said that she could just hold her night dose of Metformin.

## 2014-03-30 NOTE — Progress Notes (Signed)
CC'ED TO PCP 

## 2014-04-01 ENCOUNTER — Ambulatory Visit (HOSPITAL_COMMUNITY)
Admission: RE | Admit: 2014-04-01 | Discharge: 2014-04-01 | Disposition: A | Discharge status: Home / Self Care | Payer: Medicare Other | Source: Ambulatory Visit | Attending: Internal Medicine | Admitting: Internal Medicine

## 2014-04-01 ENCOUNTER — Encounter (HOSPITAL_COMMUNITY)
Admission: RE | Disposition: A | Discharge status: Home / Self Care | Payer: Self-pay | Source: Ambulatory Visit | Attending: Internal Medicine

## 2014-04-01 DIAGNOSIS — E785 Hyperlipidemia, unspecified: Secondary | ICD-10-CM | POA: Insufficient documentation

## 2014-04-01 DIAGNOSIS — E669 Obesity, unspecified: Secondary | ICD-10-CM | POA: Diagnosis not present

## 2014-04-01 DIAGNOSIS — M1990 Unspecified osteoarthritis, unspecified site: Secondary | ICD-10-CM | POA: Diagnosis not present

## 2014-04-01 DIAGNOSIS — E039 Hypothyroidism, unspecified: Secondary | ICD-10-CM | POA: Diagnosis not present

## 2014-04-01 DIAGNOSIS — Z1211 Encounter for screening for malignant neoplasm of colon: Secondary | ICD-10-CM | POA: Diagnosis not present

## 2014-04-01 DIAGNOSIS — D122 Benign neoplasm of ascending colon: Secondary | ICD-10-CM | POA: Diagnosis not present

## 2014-04-01 DIAGNOSIS — Z7982 Long term (current) use of aspirin: Secondary | ICD-10-CM | POA: Insufficient documentation

## 2014-04-01 DIAGNOSIS — I10 Essential (primary) hypertension: Secondary | ICD-10-CM | POA: Insufficient documentation

## 2014-04-01 DIAGNOSIS — E119 Type 2 diabetes mellitus without complications: Secondary | ICD-10-CM | POA: Diagnosis not present

## 2014-04-01 DIAGNOSIS — Z860100 Personal history of colon polyps, unspecified: Secondary | ICD-10-CM | POA: Insufficient documentation

## 2014-04-01 DIAGNOSIS — Z79899 Other long term (current) drug therapy: Secondary | ICD-10-CM | POA: Diagnosis not present

## 2014-04-01 DIAGNOSIS — Z6834 Body mass index (BMI) 34.0-34.9, adult: Secondary | ICD-10-CM | POA: Insufficient documentation

## 2014-04-01 DIAGNOSIS — Z833 Family history of diabetes mellitus: Secondary | ICD-10-CM | POA: Insufficient documentation

## 2014-04-01 HISTORY — PX: COLONOSCOPY: SHX5424

## 2014-04-01 LAB — GLUCOSE, CAPILLARY: Glucose-Capillary: 176 mg/dL — ABNORMAL HIGH (ref 70–99)

## 2014-04-01 SURGERY — COLONOSCOPY
Anesthesia: Moderate Sedation

## 2014-04-01 MED ORDER — MIDAZOLAM HCL 5 MG/5ML IJ SOLN
INTRAMUSCULAR | Status: DC | PRN
  Administered 2014-04-01: 2 mg via INTRAVENOUS
  Administered 2014-04-01 (×2): 1 mg via INTRAVENOUS
  Administered 2014-04-01: 2 mg via INTRAVENOUS

## 2014-04-01 MED ORDER — MEPERIDINE HCL 100 MG/ML IJ SOLN
INTRAMUSCULAR | Status: DC | PRN
  Administered 2014-04-01: 50 mg via INTRAVENOUS
  Administered 2014-04-01: 25 mg via INTRAVENOUS

## 2014-04-01 MED ORDER — STERILE WATER FOR IRRIGATION IR SOLN
Status: DC | PRN
  Administered 2014-04-01: 12:00:00

## 2014-04-01 MED ORDER — MIDAZOLAM HCL 5 MG/5ML IJ SOLN
INTRAMUSCULAR | Status: AC
  Filled 2014-04-01: qty 10

## 2014-04-01 MED ORDER — SODIUM CHLORIDE 0.9 % IV SOLN
INTRAVENOUS | Status: DC
  Administered 2014-04-01: 11:00:00 via INTRAVENOUS

## 2014-04-01 MED ORDER — ONDANSETRON HCL 4 MG/2ML IJ SOLN
INTRAMUSCULAR | Status: DC | PRN
  Administered 2014-04-01: 4 mg via INTRAVENOUS

## 2014-04-01 MED ORDER — ONDANSETRON HCL 4 MG/2ML IJ SOLN
INTRAMUSCULAR | Status: AC
  Filled 2014-04-01: qty 2

## 2014-04-01 MED ORDER — MEPERIDINE HCL 100 MG/ML IJ SOLN
INTRAMUSCULAR | Status: AC
  Filled 2014-04-01: qty 2

## 2014-04-01 NOTE — Op Note (Signed)
[facility] [address] [place], [address]   COLONOSCOPY PROCEDURE REPORT  PATIENT: Kim, Mills  MR#: [id number] BIRTHDATE: 08-06-1940 , 73  yrs. old GENDER: female ENDOSCOPIST: R.  Garfield Cornea, MD FACP [facility] REFERRED BY: PROCEDURE DATE:  04/29/14 PROCEDURE: INDICATIONS: MEDICATIONS: Versed 3 mg IV Inderal 25 mg IV in divided doses. ASA CLASS:       Class II  CONSENT: The risks, benefits, alternatives and imponderables including but not limited to bleeding, perforation as well as the possibility of a missed lesion have been reviewed.  The potential for biopsy, lesion removal, etc. have also been discussed. Questions have been answered.  All parties agreeable.  Please see the history and physical in the medical record for more information.  DESCRIPTION OF PROCEDURE:   After the risks benefits and alternatives of the procedure were thoroughly explained, informed consent was obtained.  The digital rectal exam revealed no abnormalities of the rectum.   The EC-[id number] ([id number])  endoscope was introduced through the anus and advanced to the cecum, which was identified by the appendix. No adverse events experienced. The quality of the prep was adequate  The instrument was then slowly withdrawn as the colon was fully examined.      COLON FINDINGS: Diffusely pigmented rectal mucosa consistent with melanosis coli; otherwise normal mucosa.  Diffusely pigmented colonic mucosa; (2) diminutive polyps in the mid ascending segment; otherwise, the remainder of the colonic mucosa appeared normal.  The 2 above-mentioned polyps were cold biopsy/removed.  Retroflexion was performed. .  Withdrawal time=10 minutes 0 seconds.  The scope was withdrawn and the procedure completed. COMPLICATIONS: There were no immediate complications.  ENDOSCOPIC IMPRESSION: Melanosis coli. Colonic polyps?"removed as described above.  RECOMMENDATIONS: Follow up on pathology.  Begin Linzess 145  -  one capsule daily to every other day as needed. Office visit in 3 months.  eSigned:  R. Garfield Cornea, MD Rosalita Chessman [facility] 04-29-2014 12:48 PM   cc:  CPT CODES: ICD CODES:  The ICD and CPT codes recommended by this software are interpretations from the data that the clinical staff has captured with the software.  The verification of the translation of this report to the ICD and CPT codes and modifiers is the sole responsibility of the health care institution and practicing physician where this report was generated.  [employer] will not be held responsible for the validity of the ICD and CPT codes included on this report.  AMA assumes no liability for data contained or not contained herein. CPT is a Designer, television/film set of the [employer].

## 2014-04-01 NOTE — Discharge Instructions (Addendum)
Colonoscopy Discharge Instructions  Read the instructions outlined below and refer to this sheet in the next few weeks. These discharge instructions provide you with general information on caring for yourself after you leave the hospital. Your doctor may also give you specific instructions. While your treatment has been planned according to the most current medical practices available, unavoidable complications occasionally occur. If you have any problems or questions after discharge, call Dr. Gala Romney at [phone]. ACTIVITY  You may resume your regular activity, but move at a slower pace for the next 24 hours.   Take frequent rest periods for the next 24 hours.   Walking will help get rid of the air and reduce the bloated feeling in your belly (abdomen).   No driving for 24 hours (because of the medicine (anesthesia) used during the test).    Do not sign any important legal documents or operate any machinery for 24 hours (because of the anesthesia used during the test).  NUTRITION  Drink plenty of fluids.   You may resume your normal diet as instructed by your doctor.   Begin with a light meal and progress to your normal diet. Heavy or fried foods are harder to digest and may make you feel sick to your stomach (nauseated).   Avoid alcoholic beverages for 24 hours or as instructed.  MEDICATIONS  You may resume your normal medications unless your doctor tells you otherwise.  WHAT YOU CAN EXPECT TODAY  Some feelings of bloating in the abdomen.   Passage of more gas than usual.   Spotting of blood in your stool or on the toilet paper.  IF YOU HAD POLYPS REMOVED DURING THE COLONOSCOPY:  No aspirin products for 7 days or as instructed.   No alcohol for 7 days or as instructed.   Eat a soft diet for the next 24 hours.  FINDING OUT THE RESULTS OF YOUR TEST Not all test results are available during your visit. If your test results are not back during the visit, make an appointment  with your caregiver to find out the results. Do not assume everything is normal if you have not heard from your caregiver or the medical facility. It is important for you to follow up on all of your test results.  SEEK IMMEDIATE MEDICAL ATTENTION IF:  You have more than a spotting of blood in your stool.   Your belly is swollen (abdominal distention).   You are nauseated or vomiting.   You have a temperature over 101.   You have abdominal pain or discomfort that is severe or gets worse throughout the day.    Constipation polyp information provided  Further recommendations to follow pending review of pathology report  Begin Linzess 145 capsules - 1 daily every other day as needed for constipation  Office visit with [place] in 3 months.  Colon Polyps Polyps are lumps of extra tissue growing inside the body. Polyps can grow in the large intestine (colon). Most colon polyps are noncancerous (benign). However, some colon polyps can become cancerous over time. Polyps that are larger than a pea may be harmful. To be safe, caregivers remove and test all polyps. CAUSES  Polyps form when mutations in the genes cause your cells to grow and divide even though no more tissue is needed. RISK FACTORS There are a number of risk factors that can increase your chances of getting colon polyps. They include:  Being older than 50 years.  Family history of colon polyps or colon cancer.  Long-term colon diseases, such as colitis or Crohn disease.  Being overweight.  Smoking.  Being inactive.  Drinking too much alcohol. SYMPTOMS  Most small polyps do not cause symptoms. If symptoms are present, they may include:  Blood in the stool. The stool may look dark red or black.  Constipation or diarrhea that lasts longer than 1 week. DIAGNOSIS People often do not know they have polyps until their caregiver finds them during a regular checkup. Your caregiver can use 4 tests to check for polyps:  Digital  rectal exam. The caregiver wears gloves and feels inside the rectum. This test would find polyps only in the rectum.  Barium enema. The caregiver puts a liquid called barium into your rectum before taking X-rays of your colon. Barium makes your colon look white. Polyps are dark, so they are easy to see in the X-ray pictures.  Sigmoidoscopy. A thin, flexible tube (sigmoidoscope) is placed into your rectum. The sigmoidoscope has a light and tiny camera in it. The caregiver uses the sigmoidoscope to look at the last third of your colon.  Colonoscopy. This test is like sigmoidoscopy, but the caregiver looks at the entire colon. This is the most common method for finding and removing polyps. TREATMENT  Any polyps will be removed during a sigmoidoscopy or colonoscopy. The polyps are then tested for cancer. PREVENTION  To help lower your risk of getting more colon polyps:  Eat plenty of fruits and vegetables. Avoid eating fatty foods.  Do not smoke.  Avoid drinking alcohol.  Exercise every day.  Lose weight if recommended by your caregiver.  Eat plenty of calcium and folate. Foods that are rich in calcium include milk, cheese, and broccoli. Foods that are rich in folate include chickpeas, kidney beans, and spinach. HOME CARE INSTRUCTIONS Keep all follow-up appointments as directed by your caregiver. You may need periodic exams to check for polyps. SEEK MEDICAL CARE IF: You notice bleeding during a bowel movement. Document Released: 10/22/2003 Document Revised: 04/19/2011 Document Reviewed: 04/06/2011 [facility] Patient Information 2015 [place], [place]. This information is not intended to replace advice given to you by your health care provider. Make sure you discuss any questions you have with your health care provider.   Constipation Constipation is when a person has fewer than three bowel movements a week, has difficulty having a bowel movement, or has stools that are dry, hard, or larger  than normal. As people grow older, constipation is more common. If you try to fix constipation with medicines that make you have a bowel movement (laxatives), the problem may get worse. Long-term laxative use may cause the muscles of the colon to become weak. A low-fiber diet, not taking in enough fluids, and taking certain medicines may make constipation worse.  CAUSES   Certain medicines, such as antidepressants, pain medicine, iron supplements, antacids, and water pills.   Certain diseases, such as diabetes, irritable bowel syndrome (IBS), thyroid disease, or depression.   Not drinking enough water.   Not eating enough fiber-rich foods.   Stress or travel.   Lack of physical activity or exercise.   Ignoring the urge to have a bowel movement.   Using laxatives too much.  SIGNS AND SYMPTOMS   Having fewer than three bowel movements a week.   Straining to have a bowel movement.   Having stools that are hard, dry, or larger than normal.   Feeling full or bloated.   Pain in the lower abdomen.   Not feeling relief after having  a bowel movement.  DIAGNOSIS  Your health care provider will take a medical history and perform a physical exam. Further testing may be done for severe constipation. Some tests may include:  A barium enema X-ray to examine your rectum, colon, and, sometimes, your small intestine.   A sigmoidoscopy to examine your lower colon.   A colonoscopy to examine your entire colon. TREATMENT  Treatment will depend on the severity of your constipation and what is causing it. Some dietary treatments include drinking more fluids and eating more fiber-rich foods. Lifestyle treatments may include regular exercise. If these diet and lifestyle recommendations do not help, your health care provider may recommend taking over-the-counter laxative medicines to help you have bowel movements. Prescription medicines may be prescribed if over-the-counter medicines do  not work.  HOME CARE INSTRUCTIONS   Eat foods that have a lot of fiber, such as fruits, vegetables, whole grains, and beans.  Limit foods high in fat and processed sugars, such as french fries, hamburgers, cookies, candies, and soda.   A fiber supplement may be added to your diet if you cannot get enough fiber from foods.   Drink enough fluids to keep your urine clear or pale yellow.   Exercise regularly or as directed by your health care provider.   Go to the restroom when you have the urge to go. Do not hold it.   Only take over-the-counter or prescription medicines as directed by your health care provider. Do not take other medicines for constipation without talking to your health care provider first.  [place] IF:   You have bright red blood in your stool.   Your constipation lasts for more than 4 days or gets worse.   You have abdominal or rectal pain.   You have thin, pencil-like stools.   You have unexplained weight loss. MAKE SURE YOU:   Understand these instructions.  Will watch your condition.  Will get help right away if you are not doing well or get worse. Document Released: 10/24/2003 Document Revised: 01/30/2013 Document Reviewed: 11/06/2012 [facility] Patient Information 2015 [place], [place]. This information is not intended to replace advice given to you by your health care provider. Make sure you discuss any questions you have with your health care provider.

## 2014-04-01 NOTE — H&P (View-Only) (Signed)
Primary Care Physician:  Rosita Fire, MD Primary Gastroenterologist:  Dr. Gala Romney  Chief Complaint  Patient presents with  . set up TCS    HPI:   74 year old female presents to schedule first colonoscopy. States she also has problems with constipation. Patient was told several years ago that she has "a pocket that causes the feces to get stubborn." Does have issues with constipation, will take laxatives when it gets "too bad." Will use any of a number of agents with the last one being magnesium citrate a couple months ago. Has tried Miralax but states it made her nauseated. When she's constipated she has to strain and the stool is hard. Drinks minimal water in a day, eats minimal fruits and vegetables. Denies hematochezia and melena. Has GERD symptoms. Was on Prilosec but has not taken it in a while, her refill expired. Denies dysphagia. No other upper or lower GI symptoms.  Past Medical History  Diagnosis Date  . Diabetes mellitus     A1c of 7.4 in 08/2010  . Arthritis   . Hypothyroidism   . Chest pain     Associated with weakness and fatigue  . Obesity   . Hypertension     Lipid profile in 08/2010:190, 121, 48, 118; normal CBC and CMet  . Hyperlipidemia     Lipid profile in 08/2010:190, 121, 48, 118; normal CBC and CMet    Past Surgical History  Procedure Laterality Date  . Appendectomy    . Cholecystectomy    . Thyroidectomy, partial      Current Outpatient Prescriptions  Medication Sig Dispense Refill  . aspirin 81 MG tablet Take 81 mg by mouth daily.      Marland Kitchen glyBURIDE-metformin (GLUCOVANCE) 2.5-500 MG per tablet Take 1 tablet by mouth 2 (two) times daily with a meal.     . losartan-hydrochlorothiazide (HYZAAR) 100-12.5 MG per tablet Take 1 tablet by mouth daily.    . naproxen (NAPROSYN) 500 MG tablet Take 500 mg by mouth daily.    . Nutritional Supplements (COLON FORMULA PO) Take 1 capsule by mouth as needed.    Marland Kitchen SYNTHROID 175 MCG tablet Take 175 mcg by mouth daily.      Marland Kitchen LIPITOR 80 MG tablet Take 1 tablet (80 mg total) by mouth daily. 30 tablet 3  . omeprazole (PRILOSEC) 40 MG capsule Take 1 capsule (40 mg total) by mouth daily. 30 capsule 3   No current facility-administered medications for this visit.    Allergies as of 03/13/2014  . (No Known Allergies)    Family History  Problem Relation Age of Onset  . Heart block Mother   . Uterine cancer Sister   . Diabetes Sister     History   Social History  . Marital Status: Widowed    Spouse Name: N/A    Number of Children: N/A  . Years of Education: N/A   Occupational History  . Not on file.   Social History Main Topics  . Smoking status: Never Smoker   . Smokeless tobacco: Never Used  . Alcohol Use: No  . Drug Use: No  . Sexual Activity: Not on file   Other Topics Concern  . Not on file   Social History Narrative    Review of Systems: Gen: Denies any fever, chills, lack of appetite. Admits unintentional weight loss. CV: Denies chest pain, heart palpitations, syncope.  Resp: Denies shortness of breath at rest or with exertion. Denies wheezing.  GI: See HPI. Denies  jaundice, hematemesis, fecal incontinence. MS: Denies joint pain, muscle weakness, cramps, or limitation of movement.  Derm: Denies rash, itching, dry skin Psych: Denies depression, anxiety, memory loss, and confusion Heme: Denies bruising, bleeding, and enlarged lymph nodes.  Physical Exam: BP 174/81 mmHg  Pulse 90  Temp(Src) 98 F (36.7 C)  Ht 5\' 4"  (1.626 m)  Wt 202 lb (91.627 kg)  BMI 34.66 kg/m2 General:   Alert and oriented. Pleasant and cooperative. Well-nourished and well-developed.  Head:  Normocephalic and atraumatic. Eyes:  Without icterus, sclera clear and conjunctiva pink.  Ears:  Normal auditory acuity. Nose:  No deformity, discharge,  or lesions. Mouth:  No deformity or lesions, oral mucosa pink.  Neck:  Supple, without mass or thyromegaly. Lungs:  Clear to auscultation bilaterally. No wheezes,  rales, or rhonchi. No distress.  Heart:  S1, S2 present without murmurs appreciated.  Abdomen:  +BS, soft, non-tender and non-distended. No HSM noted. No guarding or rebound. No masses appreciated.  Rectal:  Deferred  Msk:  Symmetrical without gross deformities. Normal posture. Pulses:  Normal pulses noted. Extremities:  Without clubbing or edema. Neurologic:  Alert and  oriented x4;  grossly normal neurologically. Skin:  Intact without significant lesions or rashes. Cervical Nodes:  No significant cervical adenopathy. Psych:  Alert and cooperative. Normal mood and affect.     03/13/2014 2:52 PM

## 2014-04-01 NOTE — Interval H&P Note (Signed)
History and Physical Interval Note:  04/01/2014 11:47 AM  Kim Mills  has presented today for surgery, with the diagnosis of SCREENING  The various methods of treatment have been discussed with the patient and family. After consideration of risks, benefits and other options for treatment, the patient has consented to  Procedure(s) with comments: COLONOSCOPY (N/A) - 1200 as a surgical intervention .  The patient's history has been reviewed, patient examined, no change in status, stable for surgery.  I have reviewed the patient's chart and labs.  Questions were answered to the patient's satisfaction.     Kim Mills  No change. Colonoscopy per plan.The risks, benefits, limitations, alternatives and imponderables have been reviewed with the patient. Questions have been answered. All parties are agreeable.

## 2014-04-02 ENCOUNTER — Encounter (HOSPITAL_COMMUNITY): Payer: Self-pay | Admitting: Internal Medicine

## 2014-04-03 ENCOUNTER — Telehealth: Payer: Self-pay

## 2014-04-03 ENCOUNTER — Encounter: Payer: Self-pay | Admitting: Internal Medicine

## 2014-04-03 NOTE — Telephone Encounter (Signed)
Letter mailed to the pt. 

## 2014-04-03 NOTE — Telephone Encounter (Signed)
PATIENT SCHEDULED FOR APPOINTMENT  °

## 2014-04-03 NOTE — Telephone Encounter (Signed)
Per RMR- Send letter to patient.  Send copy of letter with path to referring provider and PCP.   Offer follow-up with extender in 3 months reference GERD and constipation

## 2014-04-08 NOTE — Telephone Encounter (Signed)
Spoke with Jonni Sanger at [facility]- he said linzess is not covered at all and we cannot do a prior authorization to get it covered. Pt has tried and failed miralax and mag citrate.   Do you want to try amitiza?

## 2014-04-08 NOTE — Telephone Encounter (Signed)
Pharmacy called office and states that linzess is not covered and it will cost pt 300.00.  Please advise

## 2014-04-09 DIAGNOSIS — R5383 Other fatigue: Secondary | ICD-10-CM | POA: Diagnosis not present

## 2014-04-09 DIAGNOSIS — E1165 Type 2 diabetes mellitus with hyperglycemia: Secondary | ICD-10-CM | POA: Diagnosis not present

## 2014-04-09 DIAGNOSIS — I10 Essential (primary) hypertension: Secondary | ICD-10-CM | POA: Diagnosis not present

## 2014-04-09 DIAGNOSIS — E559 Vitamin D deficiency, unspecified: Secondary | ICD-10-CM | POA: Diagnosis not present

## 2014-04-09 DIAGNOSIS — E669 Obesity, unspecified: Secondary | ICD-10-CM | POA: Diagnosis not present

## 2014-04-09 DIAGNOSIS — E039 Hypothyroidism, unspecified: Secondary | ICD-10-CM | POA: Diagnosis not present

## 2014-04-09 MED ORDER — LUBIPROSTONE 8 MCG PO CAPS: 8.0000 ug | ORAL_CAPSULE | Freq: Two times a day (BID) | ORAL | Status: DC

## 2014-04-09 NOTE — Telephone Encounter (Signed)
Tried to call pt- LMOM 

## 2014-04-09 NOTE — Telephone Encounter (Signed)
Can try Amitiza 8 mcg twice daily. Emphasize potential for nausea and importance of taking during meals

## 2014-04-11 NOTE — Telephone Encounter (Signed)
Pt is aware to take it with food and will call if she has any problems with it.

## 2014-04-11 NOTE — Telephone Encounter (Signed)
Tried to call pt- LMOM 

## 2014-06-11 ENCOUNTER — Ambulatory Visit: Payer: Medicare Other | Admitting: Nurse Practitioner

## 2014-06-20 ENCOUNTER — Encounter: Payer: Self-pay | Admitting: Nurse Practitioner

## 2014-06-20 ENCOUNTER — Ambulatory Visit (INDEPENDENT_AMBULATORY_CARE_PROVIDER_SITE_OTHER): Payer: Medicare Other | Admitting: Nurse Practitioner

## 2014-06-20 VITALS — BP 147/79 | HR 91 | Temp 98.6°F | Ht 62.0 in | Wt 202.4 lb

## 2014-06-20 DIAGNOSIS — K219 Gastro-esophageal reflux disease without esophagitis: Secondary | ICD-10-CM | POA: Diagnosis not present

## 2014-06-20 DIAGNOSIS — K5900 Constipation, unspecified: Secondary | ICD-10-CM | POA: Diagnosis not present

## 2014-06-20 NOTE — Progress Notes (Signed)
Referring Provider: Rosita Fire, MD Primary Care Physician:  Rosita Fire, MD Primary GI:  Dr. Gala Romney  Chief Complaint  Patient presents with  . Follow-up  . Constipation    HPI:   74 year old female presents for follow-up after procedure for constipation. Has previously failed MiraLAX. Colonoscopy completed to 2216 findings include melanosis coli, 2 diminutive polyps in the midascending segment, otherwise normal mucosa. Pathology showed both segments to be tubular adenoma and recommended repeat colonoscopy in 7 years (2023). After colonoscopy was recommended to begin Linzess 145 mg 1 capsule daily to every other day as needed. Phone call from patient stated that pharmacy informed her that her insurance would not cover Jackson. Subsequently was recommended to take Amitiza 8 g twice daily with meals.  The day she states her constipation symptoms are improved. Amitiza works well for her. She takes it prn and gets near instant relief. No nausea side effects. Denies hematochezia and melena. Denies abdominal pain. She has been taking her Prilosec and this is controlling her symptoms well. Denies chest pain, dyspnea, syncope, near syncope, dizziness, lightheadedness. Denies any other upper or lower GI symptoms.   Of note, patient stated that she was feeling like her blood sugar was low during the office visit. Offered a snack and patient declined this. No CG machine in office. Patient stated she wanted to wait to eat until she got home so she could check her CBG. Agreed to take a snack with her in case her symptoms became worse. No shaking or diaphoresis noted.  Past Medical History  Diagnosis Date  . Diabetes mellitus     A1c of 7.4 in 08/2010  . Arthritis   . Hypothyroidism   . Chest pain     Associated with weakness and fatigue  . Obesity   . Hypertension     Lipid profile in 08/2010:190, 121, 48, 118; normal CBC and CMet  . Hyperlipidemia     Lipid profile in 08/2010:190, 121, 48,  118; normal CBC and CMet    Past Surgical History  Procedure Laterality Date  . Appendectomy    . Cholecystectomy    . Thyroidectomy, partial    . Colonoscopy N/A 04/01/2014    RMR: Melanosis coli. colonic polyps removed as described above.     Current Outpatient Prescriptions  Medication Sig Dispense Refill  . aspirin 81 MG tablet Take 81 mg by mouth daily.      Marland Kitchen glipiZIDE (GLUCOTROL) 5 MG tablet Take 1 tablet by mouth 2 (two) times daily.    Marland Kitchen LIPITOR 80 MG tablet Take 1 tablet (80 mg total) by mouth daily. 30 tablet 3  . losartan-hydrochlorothiazide (HYZAAR) 100-12.5 MG per tablet Take 1 tablet by mouth daily.    Marland Kitchen lubiprostone (AMITIZA) 8 MCG capsule Take 1 capsule (8 mcg total) by mouth 2 (two) times daily with a meal. 60 capsule 3  . meclizine (ANTIVERT) 25 MG tablet as needed.     . metFORMIN (GLUCOPHAGE) 500 MG tablet Take 1 tablet by mouth 2 (two) times daily.    . naproxen (NAPROSYN) 500 MG tablet Take 500 mg by mouth daily as needed for mild pain.     . Nutritional Supplements (COLON FORMULA PO) Take 1 capsule by mouth as needed (constipation).     Marland Kitchen omeprazole (PRILOSEC) 40 MG capsule Take 1 capsule (40 mg total) by mouth daily. 90 capsule 3  . SYNTHROID 150 MCG tablet Take 1 tablet by mouth daily.    . TRAVATAN Z 0.004 %  SOLN ophthalmic solution Place 1 drop into both eyes at bedtime.    Marland Kitchen VITAMIN D, CHOLECALCIFEROL, PO Take 5,000 Units by mouth once a week.    . polyethylene glycol-electrolytes (TRILYTE) 420 G solution Take 4,000 mLs by mouth as directed. (Patient not taking: Reported on 06/20/2014) 4000 mL 0   No current facility-administered medications for this visit.    Allergies as of 06/20/2014  . (No Known Allergies)    Family History  Problem Relation Age of Onset  . Heart block Mother   . Uterine cancer Sister   . Diabetes Sister     History   Social History  . Marital Status: Widowed    Spouse Name: N/A  . Number of Children: N/A  . Years of  Education: N/A   Social History Main Topics  . Smoking status: Never Smoker   . Smokeless tobacco: Never Used  . Alcohol Use: No  . Drug Use: No  . Sexual Activity: Not on file   Other Topics Concern  . None   Social History Narrative    Review of Systems: General: Negative for anorexia, weight loss, fever, chills, fatigue, weakness. Eyes: Negative for vision changes.  ENT: Negative for hoarseness, difficulty swallowing , nasal congestion. CV: Negative for chest pain, angina, palpitations, dyspnea on exertion, peripheral edema.  Respiratory: Negative for dyspnea at rest, dyspnea on exertion, cough, sputum, wheezing.  GI: See history of present illness. GU:  Negative for dysuria, hematuria, urinary incontinence, urinary frequency, nocturnal urination.  MS: Negative for joint pain, low back pain.  Derm: Negative for rash or itching.  Neuro: Negative for weakness, abnormal sensation, seizure, frequent headaches, memory loss, confusion.  Psych: Negative for anxiety, depression, suicidal ideation, hallucinations.  Endo: Negative for unusual weight change.  Heme: Negative for bruising or bleeding. Allergy: Negative for rash or hives.   Physical Exam: BP 147/79 mmHg  Pulse 91  Temp(Src) 98.6 F (37 C)  Ht 5\' 2"  (1.575 m)  Wt 202 lb 6.4 oz (91.808 kg)  BMI 37.01 kg/m2 General:   Alert and oriented. No distress noted. Pleasant and cooperative.  Head:  Normocephalic and atraumatic. Eyes:  Conjuctiva clear without scleral icterus. Mouth:  Oral mucosa pink and moist. Good dentition. No lesions. Neck:  Supple, without mass or thyromegaly. Lungs:  Clear to auscultation bilaterally. No wheezes, rales, or rhonchi. No distress.  Heart:  S1, S2 present without murmurs, rubs, or gallops. Regular rate and rhythm. Abdomen:  +BS, soft, non-tender and non-distended. No rebound or guarding. No HSM or masses noted. Msk:  Symmetrical without gross deformities. Normal posture. Pulses:  2+ DP  noted bilaterally Extremities:  Without edema. Neurologic:  Alert and  oriented x4;  grossly normal neurologically. Skin:  Intact without significant lesions or rashes. Cervical Nodes:  No significant cervical adenopathy. Psych:  Alert and cooperative. Normal mood and affect.    06/20/2014 1:37 PM

## 2014-06-20 NOTE — Patient Instructions (Signed)
1. Continue taking the Prilosec and Amitiza as you have been taking it. 2. Return for reevaluation in 6 months. 3. If you have any worsening symptoms or changes feel free to call us before then and we can work you in sooner.

## 2014-06-21 NOTE — Assessment & Plan Note (Signed)
Symptoms improving current therapy. Continue Prilosec 40 mg daily and avoidance of trigger foods. Return for follow-up in 6 months to reevaluate progress.

## 2014-06-21 NOTE — Assessment & Plan Note (Signed)
Symptoms much improved on Amitiza. The patient taking it when necessary and gets quick relief. No additional complaints. Continue taking Amitiza as you have been. Return for follow-up in 6 months to reevaluate progress.

## 2014-06-26 NOTE — Progress Notes (Signed)
cc'ed to pcp °

## 2014-07-09 DIAGNOSIS — E1165 Type 2 diabetes mellitus with hyperglycemia: Secondary | ICD-10-CM | POA: Diagnosis not present

## 2014-07-09 DIAGNOSIS — E785 Hyperlipidemia, unspecified: Secondary | ICD-10-CM | POA: Diagnosis not present

## 2014-07-09 DIAGNOSIS — Z0000 Encounter for general adult medical examination without abnormal findings: Secondary | ICD-10-CM | POA: Diagnosis not present

## 2014-10-08 ENCOUNTER — Other Ambulatory Visit (HOSPITAL_COMMUNITY): Payer: Self-pay | Admitting: Internal Medicine

## 2014-10-08 DIAGNOSIS — M1990 Unspecified osteoarthritis, unspecified site: Secondary | ICD-10-CM

## 2014-10-08 DIAGNOSIS — E559 Vitamin D deficiency, unspecified: Secondary | ICD-10-CM | POA: Diagnosis not present

## 2014-10-08 DIAGNOSIS — E039 Hypothyroidism, unspecified: Secondary | ICD-10-CM | POA: Diagnosis not present

## 2014-10-08 DIAGNOSIS — E1165 Type 2 diabetes mellitus with hyperglycemia: Secondary | ICD-10-CM | POA: Diagnosis not present

## 2014-10-08 DIAGNOSIS — I10 Essential (primary) hypertension: Secondary | ICD-10-CM | POA: Diagnosis not present

## 2014-10-11 ENCOUNTER — Other Ambulatory Visit (HOSPITAL_COMMUNITY): Payer: Self-pay | Admitting: Internal Medicine

## 2014-10-11 DIAGNOSIS — Z780 Asymptomatic menopausal state: Secondary | ICD-10-CM

## 2014-10-11 DIAGNOSIS — M1990 Unspecified osteoarthritis, unspecified site: Secondary | ICD-10-CM

## 2014-10-15 ENCOUNTER — Other Ambulatory Visit (HOSPITAL_COMMUNITY): Payer: Medicare Other

## 2014-10-21 ENCOUNTER — Ambulatory Visit (HOSPITAL_COMMUNITY)
Admission: RE | Admit: 2014-10-21 | Discharge: 2014-10-21 | Disposition: A | Discharge status: Home / Self Care | Payer: Medicare Other | Source: Ambulatory Visit | Attending: Internal Medicine | Admitting: Internal Medicine

## 2014-10-21 DIAGNOSIS — M1990 Unspecified osteoarthritis, unspecified site: Secondary | ICD-10-CM | POA: Insufficient documentation

## 2014-10-21 DIAGNOSIS — M899 Disorder of bone, unspecified: Secondary | ICD-10-CM | POA: Diagnosis not present

## 2014-10-21 DIAGNOSIS — Z780 Asymptomatic menopausal state: Secondary | ICD-10-CM | POA: Diagnosis not present

## 2014-12-23 ENCOUNTER — Ambulatory Visit: Payer: Medicare Other | Admitting: Nurse Practitioner

## 2015-06-02 DIAGNOSIS — R32 Unspecified urinary incontinence: Secondary | ICD-10-CM | POA: Diagnosis not present

## 2015-06-02 DIAGNOSIS — M1990 Unspecified osteoarthritis, unspecified site: Secondary | ICD-10-CM | POA: Diagnosis not present

## 2015-06-02 DIAGNOSIS — E559 Vitamin D deficiency, unspecified: Secondary | ICD-10-CM | POA: Diagnosis not present

## 2015-06-02 DIAGNOSIS — E039 Hypothyroidism, unspecified: Secondary | ICD-10-CM | POA: Diagnosis not present

## 2015-06-02 DIAGNOSIS — E785 Hyperlipidemia, unspecified: Secondary | ICD-10-CM | POA: Diagnosis not present

## 2015-06-02 DIAGNOSIS — I10 Essential (primary) hypertension: Secondary | ICD-10-CM | POA: Diagnosis not present

## 2015-06-02 DIAGNOSIS — E1165 Type 2 diabetes mellitus with hyperglycemia: Secondary | ICD-10-CM | POA: Diagnosis not present

## 2015-09-08 DIAGNOSIS — E1142 Type 2 diabetes mellitus with diabetic polyneuropathy: Secondary | ICD-10-CM | POA: Diagnosis not present

## 2015-09-08 DIAGNOSIS — E039 Hypothyroidism, unspecified: Secondary | ICD-10-CM | POA: Diagnosis not present

## 2015-09-08 DIAGNOSIS — Z6834 Body mass index (BMI) 34.0-34.9, adult: Secondary | ICD-10-CM | POA: Diagnosis not present

## 2015-09-08 DIAGNOSIS — I10 Essential (primary) hypertension: Secondary | ICD-10-CM | POA: Diagnosis not present

## 2015-09-10 DIAGNOSIS — E1165 Type 2 diabetes mellitus with hyperglycemia: Secondary | ICD-10-CM | POA: Diagnosis not present

## 2015-09-10 DIAGNOSIS — I10 Essential (primary) hypertension: Secondary | ICD-10-CM | POA: Diagnosis not present

## 2015-10-06 ENCOUNTER — Other Ambulatory Visit: Payer: Self-pay

## 2015-10-15 DIAGNOSIS — H2513 Age-related nuclear cataract, bilateral: Secondary | ICD-10-CM | POA: Diagnosis not present

## 2015-10-15 DIAGNOSIS — E119 Type 2 diabetes mellitus without complications: Secondary | ICD-10-CM | POA: Diagnosis not present

## 2015-10-15 DIAGNOSIS — H401111 Primary open-angle glaucoma, right eye, mild stage: Secondary | ICD-10-CM | POA: Diagnosis not present

## 2015-10-15 DIAGNOSIS — H401121 Primary open-angle glaucoma, left eye, mild stage: Secondary | ICD-10-CM | POA: Diagnosis not present

## 2015-12-17 DIAGNOSIS — H401131 Primary open-angle glaucoma, bilateral, mild stage: Secondary | ICD-10-CM | POA: Diagnosis not present

## 2016-04-15 DIAGNOSIS — Z6833 Body mass index (BMI) 33.0-33.9, adult: Secondary | ICD-10-CM | POA: Diagnosis not present

## 2016-04-15 DIAGNOSIS — M1990 Unspecified osteoarthritis, unspecified site: Secondary | ICD-10-CM | POA: Diagnosis not present

## 2016-04-15 DIAGNOSIS — E785 Hyperlipidemia, unspecified: Secondary | ICD-10-CM | POA: Diagnosis not present

## 2016-04-15 DIAGNOSIS — E039 Hypothyroidism, unspecified: Secondary | ICD-10-CM | POA: Diagnosis not present

## 2016-04-15 DIAGNOSIS — E1165 Type 2 diabetes mellitus with hyperglycemia: Secondary | ICD-10-CM | POA: Diagnosis not present

## 2016-04-15 DIAGNOSIS — I10 Essential (primary) hypertension: Secondary | ICD-10-CM | POA: Diagnosis not present

## 2016-04-15 DIAGNOSIS — E559 Vitamin D deficiency, unspecified: Secondary | ICD-10-CM | POA: Diagnosis not present

## 2016-04-15 DIAGNOSIS — R32 Unspecified urinary incontinence: Secondary | ICD-10-CM | POA: Diagnosis not present

## 2016-07-15 DIAGNOSIS — I10 Essential (primary) hypertension: Secondary | ICD-10-CM | POA: Diagnosis not present

## 2016-07-15 DIAGNOSIS — E039 Hypothyroidism, unspecified: Secondary | ICD-10-CM | POA: Diagnosis not present

## 2016-07-15 DIAGNOSIS — E1165 Type 2 diabetes mellitus with hyperglycemia: Secondary | ICD-10-CM | POA: Diagnosis not present

## 2016-07-15 DIAGNOSIS — M1990 Unspecified osteoarthritis, unspecified site: Secondary | ICD-10-CM | POA: Diagnosis not present

## 2016-10-25 DIAGNOSIS — E1165 Type 2 diabetes mellitus with hyperglycemia: Secondary | ICD-10-CM | POA: Diagnosis not present

## 2016-10-25 DIAGNOSIS — I10 Essential (primary) hypertension: Secondary | ICD-10-CM | POA: Diagnosis not present

## 2016-10-25 DIAGNOSIS — E785 Hyperlipidemia, unspecified: Secondary | ICD-10-CM | POA: Diagnosis not present

## 2016-10-25 DIAGNOSIS — E039 Hypothyroidism, unspecified: Secondary | ICD-10-CM | POA: Diagnosis not present

## 2017-01-25 DIAGNOSIS — I10 Essential (primary) hypertension: Secondary | ICD-10-CM | POA: Diagnosis not present

## 2017-01-25 DIAGNOSIS — E661 Drug-induced obesity: Secondary | ICD-10-CM | POA: Diagnosis not present

## 2017-01-25 DIAGNOSIS — E559 Vitamin D deficiency, unspecified: Secondary | ICD-10-CM | POA: Diagnosis not present

## 2017-01-25 DIAGNOSIS — R32 Unspecified urinary incontinence: Secondary | ICD-10-CM | POA: Diagnosis not present

## 2017-01-25 DIAGNOSIS — Z1389 Encounter for screening for other disorder: Secondary | ICD-10-CM | POA: Diagnosis not present

## 2017-01-25 DIAGNOSIS — E669 Obesity, unspecified: Secondary | ICD-10-CM | POA: Diagnosis not present

## 2017-01-25 DIAGNOSIS — E039 Hypothyroidism, unspecified: Secondary | ICD-10-CM | POA: Diagnosis not present

## 2017-01-25 DIAGNOSIS — E785 Hyperlipidemia, unspecified: Secondary | ICD-10-CM | POA: Diagnosis not present

## 2017-01-25 DIAGNOSIS — E1165 Type 2 diabetes mellitus with hyperglycemia: Secondary | ICD-10-CM | POA: Diagnosis not present

## 2017-03-14 DIAGNOSIS — E119 Type 2 diabetes mellitus without complications: Secondary | ICD-10-CM | POA: Diagnosis not present

## 2017-03-14 DIAGNOSIS — H401131 Primary open-angle glaucoma, bilateral, mild stage: Secondary | ICD-10-CM | POA: Diagnosis not present

## 2017-03-14 DIAGNOSIS — H25013 Cortical age-related cataract, bilateral: Secondary | ICD-10-CM | POA: Diagnosis not present

## 2017-03-14 DIAGNOSIS — H2513 Age-related nuclear cataract, bilateral: Secondary | ICD-10-CM | POA: Diagnosis not present

## 2017-05-05 DIAGNOSIS — E039 Hypothyroidism, unspecified: Secondary | ICD-10-CM | POA: Diagnosis not present

## 2017-05-05 DIAGNOSIS — I10 Essential (primary) hypertension: Secondary | ICD-10-CM | POA: Diagnosis not present

## 2017-05-05 DIAGNOSIS — E785 Hyperlipidemia, unspecified: Secondary | ICD-10-CM | POA: Diagnosis not present

## 2017-06-16 ENCOUNTER — Encounter: Payer: Self-pay | Admitting: *Deleted

## 2017-09-28 DIAGNOSIS — R32 Unspecified urinary incontinence: Secondary | ICD-10-CM | POA: Diagnosis not present

## 2017-09-28 DIAGNOSIS — E785 Hyperlipidemia, unspecified: Secondary | ICD-10-CM | POA: Diagnosis not present

## 2017-09-28 DIAGNOSIS — E039 Hypothyroidism, unspecified: Secondary | ICD-10-CM | POA: Diagnosis not present

## 2017-09-28 DIAGNOSIS — E559 Vitamin D deficiency, unspecified: Secondary | ICD-10-CM | POA: Diagnosis not present

## 2017-09-28 DIAGNOSIS — E1165 Type 2 diabetes mellitus with hyperglycemia: Secondary | ICD-10-CM | POA: Diagnosis not present

## 2017-09-28 DIAGNOSIS — I10 Essential (primary) hypertension: Secondary | ICD-10-CM | POA: Diagnosis not present

## 2017-09-28 DIAGNOSIS — E669 Obesity, unspecified: Secondary | ICD-10-CM | POA: Diagnosis not present

## 2017-09-28 DIAGNOSIS — R2681 Unsteadiness on feet: Secondary | ICD-10-CM | POA: Diagnosis not present

## 2017-09-28 DIAGNOSIS — E661 Drug-induced obesity: Secondary | ICD-10-CM | POA: Diagnosis not present

## 2017-09-28 DIAGNOSIS — M1990 Unspecified osteoarthritis, unspecified site: Secondary | ICD-10-CM | POA: Diagnosis not present

## 2017-09-30 DIAGNOSIS — E785 Hyperlipidemia, unspecified: Secondary | ICD-10-CM | POA: Diagnosis not present

## 2017-09-30 DIAGNOSIS — M7661 Achilles tendinitis, right leg: Secondary | ICD-10-CM | POA: Diagnosis not present

## 2017-09-30 DIAGNOSIS — E669 Obesity, unspecified: Secondary | ICD-10-CM | POA: Diagnosis not present

## 2017-09-30 DIAGNOSIS — E1165 Type 2 diabetes mellitus with hyperglycemia: Secondary | ICD-10-CM | POA: Diagnosis not present

## 2017-09-30 DIAGNOSIS — Z6833 Body mass index (BMI) 33.0-33.9, adult: Secondary | ICD-10-CM | POA: Diagnosis not present

## 2017-09-30 DIAGNOSIS — M150 Primary generalized (osteo)arthritis: Secondary | ICD-10-CM | POA: Diagnosis not present

## 2017-09-30 DIAGNOSIS — Z7984 Long term (current) use of oral hypoglycemic drugs: Secondary | ICD-10-CM | POA: Diagnosis not present

## 2017-09-30 DIAGNOSIS — I471 Supraventricular tachycardia: Secondary | ICD-10-CM | POA: Diagnosis not present

## 2017-09-30 DIAGNOSIS — M7662 Achilles tendinitis, left leg: Secondary | ICD-10-CM | POA: Diagnosis not present

## 2017-09-30 DIAGNOSIS — E039 Hypothyroidism, unspecified: Secondary | ICD-10-CM | POA: Diagnosis not present

## 2017-09-30 DIAGNOSIS — I10 Essential (primary) hypertension: Secondary | ICD-10-CM | POA: Diagnosis not present

## 2017-10-05 ENCOUNTER — Emergency Department (HOSPITAL_COMMUNITY): Payer: Medicare Other

## 2017-10-05 ENCOUNTER — Inpatient Hospital Stay (HOSPITAL_COMMUNITY)
Admission: EM | Admit: 2017-10-05 | Discharge: 2017-10-08 | DRG: 287 | Disposition: A | Discharge status: Home Health | Payer: Medicare Other | Attending: Internal Medicine | Admitting: Internal Medicine

## 2017-10-05 ENCOUNTER — Encounter (HOSPITAL_COMMUNITY): Payer: Self-pay

## 2017-10-05 DIAGNOSIS — H409 Unspecified glaucoma: Secondary | ICD-10-CM | POA: Diagnosis present

## 2017-10-05 DIAGNOSIS — D649 Anemia, unspecified: Secondary | ICD-10-CM | POA: Diagnosis not present

## 2017-10-05 DIAGNOSIS — M1990 Unspecified osteoarthritis, unspecified site: Secondary | ICD-10-CM | POA: Diagnosis not present

## 2017-10-05 DIAGNOSIS — I471 Supraventricular tachycardia: Secondary | ICD-10-CM | POA: Diagnosis not present

## 2017-10-05 DIAGNOSIS — Z7984 Long term (current) use of oral hypoglycemic drugs: Secondary | ICD-10-CM

## 2017-10-05 DIAGNOSIS — R262 Difficulty in walking, not elsewhere classified: Secondary | ICD-10-CM | POA: Diagnosis present

## 2017-10-05 DIAGNOSIS — E785 Hyperlipidemia, unspecified: Secondary | ICD-10-CM | POA: Diagnosis present

## 2017-10-05 DIAGNOSIS — K219 Gastro-esophageal reflux disease without esophagitis: Secondary | ICD-10-CM | POA: Diagnosis present

## 2017-10-05 DIAGNOSIS — Z79899 Other long term (current) drug therapy: Secondary | ICD-10-CM | POA: Diagnosis not present

## 2017-10-05 DIAGNOSIS — Z833 Family history of diabetes mellitus: Secondary | ICD-10-CM

## 2017-10-05 DIAGNOSIS — Z79890 Hormone replacement therapy (postmenopausal): Secondary | ICD-10-CM | POA: Diagnosis not present

## 2017-10-05 DIAGNOSIS — Z86010 Personal history of colonic polyps: Secondary | ICD-10-CM | POA: Diagnosis not present

## 2017-10-05 DIAGNOSIS — K5900 Constipation, unspecified: Secondary | ICD-10-CM | POA: Diagnosis present

## 2017-10-05 DIAGNOSIS — R7989 Other specified abnormal findings of blood chemistry: Secondary | ICD-10-CM | POA: Diagnosis not present

## 2017-10-05 DIAGNOSIS — E1140 Type 2 diabetes mellitus with diabetic neuropathy, unspecified: Secondary | ICD-10-CM

## 2017-10-05 DIAGNOSIS — M7661 Achilles tendinitis, right leg: Secondary | ICD-10-CM | POA: Diagnosis not present

## 2017-10-05 DIAGNOSIS — E039 Hypothyroidism, unspecified: Secondary | ICD-10-CM | POA: Diagnosis not present

## 2017-10-05 DIAGNOSIS — E119 Type 2 diabetes mellitus without complications: Secondary | ICD-10-CM | POA: Diagnosis present

## 2017-10-05 DIAGNOSIS — E1165 Type 2 diabetes mellitus with hyperglycemia: Secondary | ICD-10-CM | POA: Diagnosis not present

## 2017-10-05 DIAGNOSIS — E669 Obesity, unspecified: Secondary | ICD-10-CM | POA: Diagnosis present

## 2017-10-05 DIAGNOSIS — R778 Other specified abnormalities of plasma proteins: Secondary | ICD-10-CM

## 2017-10-05 DIAGNOSIS — N289 Disorder of kidney and ureter, unspecified: Secondary | ICD-10-CM | POA: Diagnosis present

## 2017-10-05 DIAGNOSIS — R0602 Shortness of breath: Secondary | ICD-10-CM | POA: Diagnosis not present

## 2017-10-05 DIAGNOSIS — Z6834 Body mass index (BMI) 34.0-34.9, adult: Secondary | ICD-10-CM | POA: Diagnosis not present

## 2017-10-05 DIAGNOSIS — R531 Weakness: Secondary | ICD-10-CM | POA: Diagnosis present

## 2017-10-05 DIAGNOSIS — I2510 Atherosclerotic heart disease of native coronary artery without angina pectoris: Secondary | ICD-10-CM | POA: Diagnosis not present

## 2017-10-05 DIAGNOSIS — I10 Essential (primary) hypertension: Secondary | ICD-10-CM | POA: Diagnosis not present

## 2017-10-05 DIAGNOSIS — M150 Primary generalized (osteo)arthritis: Secondary | ICD-10-CM | POA: Diagnosis not present

## 2017-10-05 DIAGNOSIS — M7662 Achilles tendinitis, left leg: Secondary | ICD-10-CM | POA: Diagnosis not present

## 2017-10-05 LAB — COMPREHENSIVE METABOLIC PANEL
ALT: 20 U/L (ref 0–44)
AST: 26 U/L (ref 15–41)
Albumin: 4.1 g/dL (ref 3.5–5.0)
Alkaline Phosphatase: 67 U/L (ref 38–126)
Anion gap: 9 (ref 5–15)
BUN: 27 mg/dL — ABNORMAL HIGH (ref 8–23)
CO2: 24 mmol/L (ref 22–32)
Calcium: 9.2 mg/dL (ref 8.9–10.3)
Chloride: 106 mmol/L (ref 98–111)
Creatinine, Ser: 1.29 mg/dL — ABNORMAL HIGH (ref 0.44–1.00)
GFR calc Af Amer: 46 mL/min — ABNORMAL LOW (ref >60)
GFR calc non Af Amer: 40 mL/min — ABNORMAL LOW (ref >60)
Glucose, Bld: 155 mg/dL — ABNORMAL HIGH (ref 70–99)
Potassium: 4 mmol/L (ref 3.5–5.1)
Sodium: 139 mmol/L (ref 135–145)
Total Bilirubin: 0.5 mg/dL (ref 0.3–1.2)
Total Protein: 7.8 g/dL (ref 6.5–8.1)

## 2017-10-05 LAB — CBC WITH DIFFERENTIAL/PLATELET
Basophils Absolute: 0 10*3/uL (ref 0.0–0.1)
Basophils Relative: 0 %
Eosinophils Absolute: 0.4 10*3/uL (ref 0.0–0.7)
Eosinophils Relative: 3 %
HCT: 35.1 % — ABNORMAL LOW (ref 36.0–46.0)
Hemoglobin: 11.3 g/dL — ABNORMAL LOW (ref 12.0–15.0)
Lymphocytes Relative: 46 %
Lymphs Abs: 6.3 10*3/uL — ABNORMAL HIGH (ref 0.7–4.0)
MCH: 28.8 pg (ref 26.0–34.0)
MCHC: 32.2 g/dL (ref 30.0–36.0)
MCV: 89.5 fL (ref 78.0–100.0)
Monocytes Absolute: 0.9 10*3/uL (ref 0.1–1.0)
Monocytes Relative: 6 %
Neutro Abs: 6.1 10*3/uL (ref 1.7–7.7)
Neutrophils Relative %: 45 %
Platelets: 281 10*3/uL (ref 150–400)
RBC: 3.92 MIL/uL (ref 3.87–5.11)
RDW: 16.5 % — ABNORMAL HIGH (ref 11.5–15.5)
WBC: 13.6 10*3/uL — ABNORMAL HIGH (ref 4.0–10.5)

## 2017-10-05 LAB — BRAIN NATRIURETIC PEPTIDE: B Natriuretic Peptide: 138 pg/mL — ABNORMAL HIGH (ref 0.0–100.0)

## 2017-10-05 LAB — TSH: TSH: 1.479 u[IU]/mL (ref 0.350–4.500)

## 2017-10-05 LAB — MAGNESIUM: Magnesium: 1.8 mg/dL (ref 1.7–2.4)

## 2017-10-05 LAB — TROPONIN I
Troponin I: 0.77 ng/mL (ref <0.03)
Troponin I: 1.25 ng/mL (ref <0.03)

## 2017-10-05 LAB — D-DIMER, QUANTITATIVE: D-Dimer, Quant: 0.51 ug/mL-FEU — ABNORMAL HIGH (ref 0.00–0.50)

## 2017-10-05 MED ORDER — ADENOSINE 6 MG/2ML IV SOLN
INTRAVENOUS | Status: AC
  Administered 2017-10-05: 6 mg
  Filled 2017-10-05: qty 10

## 2017-10-05 MED ORDER — MAGNESIUM SULFATE 2 GM/50ML IV SOLN
2.0000 g | Freq: Once | INTRAVENOUS | Status: AC
  Administered 2017-10-05: 2 g via INTRAVENOUS
  Filled 2017-10-05: qty 50

## 2017-10-05 NOTE — ED Triage Notes (Signed)
Pt was going to be seen by therapy today ,but HR 200. Denies CP . REports SOB with ambulation

## 2017-10-05 NOTE — ED Notes (Signed)
Date and time results received: 10/05/17 2034 (use smartphrase ".now" to insert current time)  Test: troponin Critical Value: 0.77  Name of Provider Notified: Dr. Lita Mains At 2035  Orders Received? Or Actions Taken?: no/na

## 2017-10-05 NOTE — ED Notes (Signed)
EKG given to Dr. Yelverton 

## 2017-10-05 NOTE — ED Notes (Addendum)
Date and time results received: 10/05/17 10:48 PM   Test: troponin Critical Value: 1.25  Name of Provider Notified: Olevia Bowens  Orders Received? Or Actions Taken?: paging MD

## 2017-10-05 NOTE — H&P (Signed)
History and Physical    Kim Mills AOZ:[id number] DOB: 03/17/1940 DOA: 10/05/2017  PCP: Rosita Fire, MD   Patient coming from: Home.  I have personally briefly reviewed patient's old medical records in [place]  Chief Complaint: Tachycardia.  HPI: Kim Mills is a 77 y.o. female with medical history significant of osteoarthritis, cataracts, glaucoma, history of chest pain, hypothyroidism, obesity, hyperlipidemia, hypertension, type 2 diabetes who was referred by the physical therapy  after their initial evaluation showed a heart rate in the 200s.  When the patient arrived to the emergency department her heart rate was recorded at 188 bpm on her EKG. The patient stated that she has been feeling progressively more fatigued in the past 2 years, however she has been severely dyspneic in the past few weeks with minimal exertion.  She gets very fatigued and dyspneic, even when walking inside her home.  She has had a worsened beating edema of the lower extremities as well.  She denies PND, but complains of some orthopnea.  No chest pain, chest pressures or palpitations. She gets occasionally lightheaded, but thinks that this is her vertigo symptoms.  She had a normal Bruce protocol stress test in 2012.  She denies fever, chills, sore throat, rhinorrhea, productive cough, hemoptysis, abdominal pain, nausea, emesis, diarrhea, melena or hematochezia.  However she gets frequent constipation.  She denies dysuria or hematuria.  No heat or cold intolerance.  She denies polyuria, polydipsia, polyphagia or blurred vision.  ED Course: Initial vital signs temperature 98.2 F, pulse 184, respirations 20, blood pressure 117/67 mmHg and O2 sat 97% on room air.  Basal maneuvers were initially attempted, but the patient ended up receiving 6 mg of adenosine IVP which resulted on the patient converting to sinus rhythm.  Work-up analysis with moderate leukocyturia, but no other finding.  Showed white  count of 13.6 with 45% neutrophils, 46% lymphocytes and 6% monocytes.  Her d-dimer was 0.51 mcg/mL-FEU.  TSH was 1.479 mIU/mL.  Initial EKG showed SVT at 188 bpm.  BNP was 138.0 pg/mL.  Initial troponin 0 0.77 ng/mL and follow-up level was 1.25 ng/mL CMP showed a BUN of 27, creatinine 1.29 and glucose of 155 mg/dL.  All other values were normal.  A 2 view chest radiograph did not show any acute cardiopulmonary pathology.  Review of Systems: As per HPI otherwise 10 point review of systems negative.   Past Medical History:  Diagnosis Date  . Arthritis   . Cataract   . Chest pain    Associated with weakness and fatigue  . Diabetes mellitus    A1c of 7.4 in 08/2010  . Glaucoma   . Hyperlipidemia    Lipid profile in 08/2010:190, 121, 48, 118; normal CBC and CMet  . Hypertension    Lipid profile in 08/2010:190, 121, 48, 118; normal CBC and CMet  . Hypothyroidism   . Obesity     Past Surgical History:  Procedure Laterality Date  . APPENDECTOMY    . CHOLECYSTECTOMY    . COLONOSCOPY N/A 04/01/2014   RMR: Melanosis coli. colonic polyps removed as described above.   . THYROIDECTOMY, PARTIAL       reports that she has never smoked. She has never used smokeless tobacco. She reports that she does not drink alcohol or use drugs.  No Known Allergies  Family History  Problem Relation Age of Onset  . Heart block Mother   . Uterine cancer Sister   . Diabetes Sister  Prior to Admission medications   Medication Sig Start Date End Date Taking? Authorizing Provider  gabapentin (NEURONTIN) 300 MG capsule Take 300 mg by mouth at bedtime.    Yes [provider]  Garlic (GARLIQUE) 784 MG TBEC Take 1 tablet by mouth every evening.   Yes [provider]  glipiZIDE (GLUCOTROL) 5 MG tablet Take 1 tablet by mouth 2 (two) times daily. 02/09/14  Yes [provider]  meclizine (ANTIVERT) 25 MG tablet Take 25 mg by mouth 3 (three) times daily as needed for dizziness.   Yes [provider]  metFORMIN (GLUCOPHAGE) 500 MG tablet Take 1,000 mg by mouth 2 (two) times daily.  02/09/14  Yes [provider]  naproxen (NAPROSYN) 500 MG tablet Take 500 mg by mouth daily as needed for mild pain.    Yes [provider]  Omega-3 Fatty Acids (OMEGA-3 FISH OIL PO) Take 1 capsule by mouth every evening.   Yes [provider]  omeprazole (PRILOSEC) 40 MG capsule Take 1 capsule (40 mg total) by mouth daily. 03/13/14  Yes Carlis Stable, NP  SYNTHROID 150 MCG tablet Take 1 tablet by mouth daily. 02/21/14  Yes [provider]  tolterodine (DETROL LA) 4 MG 24 hr capsule Take 4 mg by mouth daily.   Yes [provider]  UNKNOWN TO PATIENT Take 1 tablet by mouth daily. Blood Pressure   Yes [provider]    Physical Exam: Vitals:   10/05/17 2200 10/05/17 2230 10/05/17 2300 10/05/17 2330  BP: (!) 128/57 128/63 (!) 135/58 (!) 119/49  Pulse: 81 76 76 76  Resp: 17 16 14 12   Temp:      TempSrc:      SpO2: 98% 99% 98% 98%  Weight:        Constitutional: NAD, calm, comfortable Eyes: PERRL, lids and conjunctivae normal ENMT: Mucous membranes are moist. Posterior pharynx clear of any exudate or lesions. Neck: normal, supple, no masses, no thyromegaly Respiratory: Decreased breath sounds on bases, otherwise clear to auscultation bilaterally, no wheezing, no crackles. Normal respiratory effort. No accessory muscle use.  Cardiovascular: Regular rate and rhythm, no murmurs / rubs / gallops.  1+ lower extremities edema.  Soft right carotid bruit.  .  Abdomen: Soft, no tenderness, no masses palpated. No hepatosplenomegaly. Bowel sounds positive.  Musculoskeletal: no clubbing / cyanosis. Good ROM, no contractures. Normal muscle tone.  Skin: no rashes, lesions, ulcers on limited dermatological examination. Neurologic: CN 2-12 grossly intact. Sensation intact, DTR normal. Strength 5/5 in all 4.  Psychiatric: Normal judgment and insight. Alert and  oriented x 4. Normal mood.   Labs on Admission: I have personally reviewed following labs and imaging studies  CBC: Recent Labs  Lab 10/05/17 1845  WBC 13.6*  NEUTROABS 6.1  HGB 11.3*  HCT 35.1*  MCV 89.5  PLT 696   Basic Metabolic Panel: Recent Labs  Lab 10/05/17 1845  NA 139  K 4.0  CL 106  CO2 24  GLUCOSE 155*  BUN 27*  CREATININE 1.29*  CALCIUM 9.2  MG 1.8   GFR: CrCl cannot be calculated (Unknown ideal weight.). Liver Function Tests: Recent Labs  Lab 10/05/17 1845  AST 26  ALT 20  ALKPHOS 67  BILITOT 0.5  PROT 7.8  ALBUMIN 4.1   No results for input(s): LIPASE, AMYLASE in the last 168 hours. No results for input(s): AMMONIA in the last 168 hours. Coagulation Profile: No results for input(s): INR, PROTIME in the last 168  hours. Cardiac Enzymes: Recent Labs  Lab 10/05/17 1845 10/05/17 2209  TROPONINI 0.77* 1.25*   BNP (last 3 results) No results for input(s): PROBNP in the last 8760 hours. HbA1C: No results for input(s): HGBA1C in the last 72 hours. CBG: No results for input(s): GLUCAP in the last 168 hours. Lipid Profile: No results for input(s): CHOL, HDL, LDLCALC, TRIG, CHOLHDL, LDLDIRECT in the last 72 hours. Thyroid Function Tests: Recent Labs    10/05/17 1845  TSH 1.479   Anemia Panel: No results for input(s): VITAMINB12, FOLATE, FERRITIN, TIBC, IRON, RETICCTPCT in the last 72 hours. Urine analysis: No results found for: COLORURINE, APPEARANCEUR, LABSPEC, PHURINE, GLUCOSEU, HGBUR, BILIRUBINUR, KETONESUR, PROTEINUR, UROBILINOGEN, NITRITE, LEUKOCYTESUR  Radiological Exams on Admission: Dg Chest 2 View  Result Date: 10/05/2017 CLINICAL DATA:  Acute onset of tachycardia with heart rate up to 200 that began earlier today. Shortness of breath with ambulation. Current history of diabetes and hypertension. EXAM: CHEST - 2 VIEW COMPARISON:  None. FINDINGS: External pacing pads are present. Cardiac silhouette normal in size. Thoracic aorta  mildly atherosclerotic. Hilar and mediastinal contours otherwise unremarkable. Lungs clear. Bronchovascular markings normal. Pulmonary vascularity normal. No visible pleural effusions. No pneumothorax. Degenerative changes and DISH involving the thoracic spine. IMPRESSION: No acute cardiopulmonary disease. Electronically Signed   By: Evangeline Dakin M.D.   On: 10/05/2017 19:53    EKG: Independently reviewed.  Vent. rate 100 BPM PR interval * ms QRS duration 69 ms QT/QTc 339/438 ms P-R-T axes 63 23 19 Sinus tachycardia Low voltage, precordial leads Abnormal R-wave progression, early transition Minimal ST depression, inferior leads  Assessment/Plan Principal Problem:   SVT (supraventricular tachycardia) (HCC) Admit to [facility]/stepdown/inpatient. Continue supplemental oxygen. Trend troponin level. Optimize electrolytes. Start low-dose metoprolol. Check echocardiogram in a.m. Cardiology to evaluate.  Active Problems:   Elevated troponin Continue level trending. Check echocardiogram. Given risk factors, suspect underlying CAD. Heparin per pharmacy protocol. Cardiology to evaluate.    Hypothyroidism TSH within normal limits. Continue levothyroxine 150 mcg p.o. daily. Follow-up TSH as needed.    Hypertension Start metoprolol 12.5 mg p.o. twice daily. Monitor blood pressure and heart rate.    Hyperlipidemia Currently not taking medication. On garlic and fish oil supplement at home.    GERD (gastroesophageal reflux disease) Protonix 40 mg p.o. daily.    Type 2 diabetes mellitus (HCC) Hold glipizide and metformin while n.p.o. CBG monitoring every 6 hours.    Constipation Start magnesium oxide 400 mg p.o. daily.   DVT prophylaxis: Lovenox SQ. Code Status: Full code. Family Communication: Also present in the ED her two sons and her daughter. Disposition Plan: Admit to [facility] for cardiology evaluation. Consults called: Cardiology on-call. Admission status:  Inpatient/stepdown.   Reubin Milan MD [facility] Pager [phone].  If 7PM-7AM, please contact night-coverage [email or web address] Password [facility]  10/05/2017, 11:46 PM

## 2017-10-05 NOTE — ED Provider Notes (Signed)
[facility] EMERGENCY DEPARTMENT Provider Note   CSN: [id number] Arrival date & time: 10/05/17  1825     History   Chief Complaint Chief Complaint  Patient presents with  . Tachycardia    HPI Kim Mills is a 77 y.o. female.  HPI Patient had physical therapist come out to her house today.  Was noted to be tachycardic.  Advised to come to the emergency department.  Patient denies any chest pain or palpitations.  No lightheadedness or dizziness.  No previous history of arrhythmia.  States she has had increasing dyspnea especially with exertion over the last few weeks.  Has not noticed any new lower extremity swelling.  No fever or chills. Past Medical History:  Diagnosis Date  . Arthritis   . Cataract   . Chest pain    Associated with weakness and fatigue  . Diabetes mellitus    A1c of 7.4 in 08/2010  . Glaucoma   . Hyperlipidemia    Lipid profile in 08/2010:190, 121, 48, 118; normal CBC and CMet  . Hypertension    Lipid profile in 08/2010:190, 121, 48, 118; normal CBC and CMet  . Hypothyroidism   . Obesity     Patient Active Problem List   Diagnosis Date Noted  . Elevated troponin   . SVT (supraventricular tachycardia) ([place]) 10/05/2017  . History of colonic polyps   . Encounter for screening colonoscopy 03/13/2014  . Constipation 03/13/2014  . GERD (gastroesophageal reflux disease) 03/13/2014  . Hyperlipidemia   . Type 2 diabetes mellitus ([place])   . Arthritis   . Hypothyroidism   . Chest pain   . Obesity   . Hypertension     Past Surgical History:  Procedure Laterality Date  . APPENDECTOMY    . CHOLECYSTECTOMY    . COLONOSCOPY N/A 04/01/2014   RMR: Melanosis coli. colonic polyps removed as described above.   . THYROIDECTOMY, PARTIAL       OB History   None      Home Medications    Prior to Admission medications   Medication Sig Start Date End Date Taking? Authorizing Provider  gabapentin (NEURONTIN) 300 MG capsule Take 300 mg by mouth at  bedtime.    Yes [provider]  Garlic (GARLIQUE) 509 MG TBEC Take 1 tablet by mouth every evening.   Yes [provider]  glipiZIDE (GLUCOTROL) 5 MG tablet Take 1 tablet by mouth 2 (two) times daily. 02/09/14  Yes [provider]  losartan-hydrochlorothiazide (HYZAAR) 100-12.5 MG tablet Take 1 tablet by mouth daily.   Yes [provider]  meclizine (ANTIVERT) 25 MG tablet Take 25 mg by mouth 3 (three) times daily as needed for dizziness.   Yes [provider]  naproxen (NAPROSYN) 500 MG tablet Take 500 mg by mouth daily as needed for mild pain.    Yes [provider]  Omega-3 Fatty Acids (OMEGA-3 FISH OIL PO) Take 1 capsule by mouth every evening.   Yes [provider]  omeprazole (PRILOSEC) 40 MG capsule Take 1 capsule (40 mg total) by mouth daily. 03/13/14  Yes Carlis Stable, NP  SYNTHROID 150 MCG tablet Take 1 tablet by mouth daily. 02/21/14  Yes [provider]  tolterodine (DETROL LA) 4 MG 24 hr capsule Take 4 mg by mouth daily.   Yes [provider]  UNKNOWN TO PATIENT Take 1 tablet by mouth daily. Blood Pressure   Yes [provider]  aspirin EC 81 MG EC tablet Take 1  tablet (81 mg total) by mouth daily. 10/09/17   Thurnell Lose, MD  atorvastatin (LIPITOR) 40 MG tablet Take 1 tablet (40 mg total) by mouth daily at 6 PM. 10/08/17   Thurnell Lose, MD  metFORMIN (GLUCOPHAGE) 500 MG tablet Take 2 tablets (1,000 mg total) by mouth 2 (two) times daily. 10/10/17   Thurnell Lose, MD  metoprolol tartrate (LOPRESSOR) 25 MG tablet Take 0.5 tablets (12.5 mg total) by mouth 2 (two) times daily. 10/08/17   Thurnell Lose, MD  vitamin B-12 (CYANOCOBALAMIN) 1000 MCG tablet Take 1 tablet (1,000 mcg total) by mouth daily. 10/08/17   Thurnell Lose, MD    Family History Family History  Problem Relation Age of Onset  . Heart block Mother   . Uterine cancer Sister   . Diabetes Sister     Social  History Social History   Tobacco Use  . Smoking status: Never Smoker  . Smokeless tobacco: Never Used  Substance Use Topics  . Alcohol use: Never    Frequency: Never  . Drug use: Never     Allergies   Patient has no known allergies.   Review of Systems Review of Systems  Constitutional: Negative for chills and fever.  HENT: Negative for sore throat and trouble swallowing.   Eyes: Negative for visual disturbance.  Respiratory: Positive for shortness of breath. Negative for cough.   Cardiovascular: Positive for leg swelling. Negative for chest pain and palpitations.  Gastrointestinal: Negative for abdominal pain, diarrhea, nausea and vomiting.  Genitourinary: Negative for dysuria, flank pain and frequency.  Musculoskeletal: Negative for back pain, myalgias and neck pain.  Skin: Negative for rash and wound.  Neurological: Negative for dizziness, weakness, light-headedness, numbness and headaches.  All other systems reviewed and are negative.    Physical Exam Updated Vital Signs BP 140/63   Pulse 61   Temp 97.8 F (36.6 C) (Oral)   Resp 14   Ht 5\' 2"  (1.575 m)   Wt 86.2 kg   SpO2 98%   BMI 34.75 kg/m   Physical Exam  Constitutional: She is oriented to person, place, and time. She appears well-developed and well-nourished. No distress.  HENT:  Head: Normocephalic and atraumatic.  Mouth/Throat: Oropharynx is clear and moist. No oropharyngeal exudate.  Eyes: Pupils are equal, round, and reactive to light. EOM are normal.  Neck: Normal range of motion. Neck supple. No JVD present.  Cardiovascular: Regular rhythm.  Tachycardia  Pulmonary/Chest: Effort normal and breath sounds normal.  Abdominal: Soft. Bowel sounds are normal. There is no tenderness. There is no rebound and no guarding.  Musculoskeletal: Normal range of motion. She exhibits edema. She exhibits no tenderness.  1+ bilateral lower extremity pitting edema.  Distal pulses intact.  Neurological: She is  alert and oriented to person, place, and time.  Moving all extremities without focal deficit.  Sensation intact.  Skin: Skin is warm and dry. Capillary refill takes less than 2 seconds. No rash noted. She is not diaphoretic. No erythema.  Psychiatric: She has a normal mood and affect. Her behavior is normal.  Nursing note and vitals reviewed.    ED Treatments / Results  Labs (all labs ordered are listed, but only abnormal results are displayed) Labs Reviewed  CBC WITH DIFFERENTIAL/PLATELET - Abnormal; Notable for the following components:      Result Value   WBC 13.6 (*)    Hemoglobin 11.3 (*)    HCT 35.1 (*)    RDW 16.5 (*)  Lymphs Abs 6.3 (*)    All other components within normal limits  COMPREHENSIVE METABOLIC PANEL - Abnormal; Notable for the following components:   Glucose, Bld 155 (*)    BUN 27 (*)    Creatinine, Ser 1.29 (*)    GFR calc non Af Amer 40 (*)    GFR calc Af Amer 46 (*)    All other components within normal limits  BRAIN NATRIURETIC PEPTIDE - Abnormal; Notable for the following components:   B Natriuretic Peptide 138.0 (*)    All other components within normal limits  TROPONIN I - Abnormal; Notable for the following components:   Troponin I 0.77 (*)    All other components within normal limits  D-DIMER, QUANTITATIVE (NOT AT [facility]) - Abnormal; Notable for the following components:   D-Dimer, Quant 0.51 (*)    All other components within normal limits  TROPONIN I - Abnormal; Notable for the following components:   Troponin I 1.25 (*)    All other components within normal limits  HEPARIN LEVEL (UNFRACTIONATED) - Abnormal; Notable for the following components:   Heparin Unfractionated 0.96 (*)    All other components within normal limits  CBC - Abnormal; Notable for the following components:   WBC 11.1 (*)    RBC 3.24 (*)    Hemoglobin 9.3 (*)    HCT 29.7 (*)    RDW 16.2 (*)    All other components within normal limits  URINALYSIS, ROUTINE W REFLEX  MICROSCOPIC - Abnormal; Notable for the following components:   APPearance HAZY (*)    Leukocytes, UA MODERATE (*)    All other components within normal limits  GLUCOSE, CAPILLARY - Abnormal; Notable for the following components:   Glucose-Capillary 158 (*)    All other components within normal limits  GLUCOSE, CAPILLARY - Abnormal; Notable for the following components:   Glucose-Capillary 144 (*)    All other components within normal limits  TROPONIN I - Abnormal; Notable for the following components:   Troponin I 0.84 (*)    All other components within normal limits  LIPID PANEL - Abnormal; Notable for the following components:   Cholesterol 264 (*)    LDL Cholesterol 192 (*)    All other components within normal limits  HEPARIN LEVEL (UNFRACTIONATED) - Abnormal; Notable for the following components:   Heparin Unfractionated 0.15 (*)    All other components within normal limits  CBC WITH DIFFERENTIAL/PLATELET - Abnormal; Notable for the following components:   RBC 3.42 (*)    Hemoglobin 9.5 (*)    HCT 30.9 (*)    RDW 16.3 (*)    Lymphs Abs 4.7 (*)    All other components within normal limits  HEPARIN LEVEL (UNFRACTIONATED) - Abnormal; Notable for the following components:   Heparin Unfractionated 0.23 (*)    All other components within normal limits  GLUCOSE, CAPILLARY - Abnormal; Notable for the following components:   Glucose-Capillary 156 (*)    All other components within normal limits  BASIC METABOLIC PANEL - Abnormal; Notable for the following components:   Glucose, Bld 150 (*)    Creatinine, Ser 1.14 (*)    GFR calc non Af Amer 46 (*)    GFR calc Af Amer 54 (*)    All other components within normal limits  GLUCOSE, CAPILLARY - Abnormal; Notable for the following components:   Glucose-Capillary 135 (*)    All other components within normal limits  GLUCOSE, CAPILLARY - Abnormal; Notable for the following components:  Glucose-Capillary 139 (*)    All other  components within normal limits  COMPREHENSIVE METABOLIC PANEL - Abnormal; Notable for the following components:   Creatinine, Ser 1.03 (*)    Total Protein 6.1 (*)    Albumin 3.1 (*)    GFR calc non Af Amer 52 (*)    All other components within normal limits  CBC - Abnormal; Notable for the following components:   RBC 3.45 (*)    Hemoglobin 10.0 (*)    HCT 31.5 (*)    RDW 16.4 (*)    All other components within normal limits  VITAMIN B12 - Abnormal; Notable for the following components:   Vitamin B-12 102 (*)    All other components within normal limits  RETICULOCYTES - Abnormal; Notable for the following components:   RBC. 3.45 (*)    All other components within normal limits  GLUCOSE, CAPILLARY - Abnormal; Notable for the following components:   Glucose-Capillary 154 (*)    All other components within normal limits  GLUCOSE, CAPILLARY - Abnormal; Notable for the following components:   Glucose-Capillary 106 (*)    All other components within normal limits  MRSA PCR SCREENING  TSH  MAGNESIUM  HEPARIN LEVEL (UNFRACTIONATED)  PROTIME-INR  FOLATE  IRON AND TIBC  FERRITIN  GLUCOSE, CAPILLARY    EKG EKG Interpretation  Date/Time:  Wednesday October 05 2017 18:53:58 EDT Ventricular Rate:  100 PR Interval:    QRS Duration: 69 QT Interval:  339 QTC Calculation: 438 R Axis:   23 Text Interpretation:  Sinus tachycardia Low voltage, precordial leads Abnormal R-wave progression, early transition Minimal ST depression, inferior leads Confirmed by Julianne Rice [phone]) on 10/05/2017 7:01:17 PM   Radiology No results found.  Procedures .Cardioversion Date/Time: 10/05/2017 7:04 PM Performed by: Julianne Rice, MD Authorized by: Julianne Rice, MD   Consent:    Consent obtained:  Emergent situation Pre-procedure details:    Cardioversion basis:  Emergent   Rhythm:  Supraventricular tachycardia   Electrode placement:  Anterior-posterior Patient sedated: No Attempt  one:    Shock outcome:  Conversion to normal sinus rhythm Post-procedure details:    Patient status:  Awake   Patient tolerance of procedure:  Tolerated well, no immediate complications Comments:     6 mg of adenosine given with conversion to normal sinus rhythm     (including critical care time)  Medications Ordered in ED Medications  ondansetron (ZOFRAN) injection 4 mg ( Intravenous MAR Unhold 10/07/17 1744)  pantoprazole (PROTONIX) EC tablet 40 mg (40 mg Oral Given 10/08/17 1004)  gabapentin (NEURONTIN) capsule 300 mg (300 mg Oral Given 10/07/17 2016)  meclizine (ANTIVERT) tablet 25 mg ( Oral MAR Unhold 10/07/17 1744)  levothyroxine (SYNTHROID, LEVOTHROID) tablet 150 mcg (150 mcg Oral Given 10/08/17 0759)  fesoterodine (TOVIAZ) tablet 8 mg (8 mg Oral Given 10/08/17 1004)  metoprolol tartrate (LOPRESSOR) tablet 12.5 mg (12.5 mg Oral Given 10/08/17 1004)  magnesium oxide (MAG-OX) tablet 400 mg (400 mg Oral Given 10/08/17 1004)  aspirin EC tablet 81 mg (81 mg Oral Given 10/08/17 1004)  atorvastatin (LIPITOR) tablet 40 mg (40 mg Oral Not Given 10/07/17 1802)  insulin aspart (novoLOG) injection 0-15 Units (0 Units Subcutaneous Not Given 10/08/17 1146)  heparin injection 5,000 Units (5,000 Units Subcutaneous Given 10/08/17 0620)  sodium chloride flush (NS) 0.9 % injection 3 mL (3 mLs Intravenous Given 10/08/17 1007)  diazepam (VALIUM) tablet 5 mg (has no administration in time range)  adenosine (ADENOCARD) 6 MG/2ML injection (6 mg  Given 10/05/17 1852)  magnesium sulfate IVPB 2 g 50 mL (0 g Intravenous Stopped 10/05/17 2335)  heparin bolus via infusion 4,000 Units (4,000 Units Intravenous Bolus from Bag 10/06/17 0259)  technetium TC 72M diethylenetriame-pentaacetic acid (DTPA) injection 30.1 millicurie (60.1 millicuries Inhalation Given 10/06/17 1100)  technetium albumin aggregated (MAA) injection solution 0.93 millicurie (2.35 millicuries Intravenous Contrast Given 10/06/17 1105)    CRITICAL  CARE Performed by: Julianne Rice Total critical care time: 35 minutes Critical care time was exclusive of separately billable procedures and treating other patients. Critical care was necessary to treat or prevent imminent or life-threatening deterioration. Critical care was time spent personally by me on the following activities: development of treatment plan with patient and/or surrogate as well as nursing, discussions with consultants, evaluation of patient's response to treatment, examination of patient, obtaining history from patient or surrogate, ordering and performing treatments and interventions, ordering and review of laboratory studies, ordering and review of radiographic studies, pulse oximetry and re-evaluation of patient's condition. Initial Impression / Assessment and Plan / ED Course  I have reviewed the triage vital signs and the nursing notes.  Pertinent labs & imaging results that were available during my care of the patient were reviewed by me and considered in my medical decision making (see chart for details).     Attempted modified Valsalva maneuver with no improvement.  Given 6 mg of adenosine with conversion back to normal sinus rhythm. Discussed with cardiology.  Recommend hospitalist admission and trending of her troponin. Final Clinical Impressions(s) / ED Diagnoses   Final diagnoses:  SVT (supraventricular tachycardia) ([facility])  Elevated troponin    ED Discharge Orders         Ordered    metFORMIN (GLUCOPHAGE) 500 MG tablet  2 times daily     10/08/17 1148    aspirin EC 81 MG EC tablet  Daily     10/08/17 1148    atorvastatin (LIPITOR) 40 MG tablet  Daily-1800     10/08/17 1148    metoprolol tartrate (LOPRESSOR) 25 MG tablet  2 times daily     10/08/17 1148    Increase activity slowly     10/08/17 1148    Discharge instructions    Comments:  Follow with Primary MD Rosita Fire, MD in 7 days , resume Metformin/Glucophage on September 2.  Get CBC, CMP,  checked  by Primary MD  in 5-7 days    Activity: As tolerated with Full fall precautions use walker/cane & assistance as needed  Disposition Home   Diet:  Heart Healthy  Low Carb  Do Accu-Cheks and monitor CBGs QA CHS, no other results to PCP in a week.  For Heart failure patients - Check your Weight same time everyday, if you gain over 2 pounds, or you develop in leg swelling, experience more shortness of breath or chest pain, call your Primary MD immediately. Follow Cardiac Low Salt Diet and 1.5 lit/day fluid restriction.  Special Instructions: If you have smoked or chewed Tobacco  in the last 2 yrs please stop smoking, stop any regular Alcohol  and or any Recreational drug use.  On your next visit with your primary care physician please Get Medicines reviewed and adjusted.  Please request your Prim.MD to go over all Hospital Tests and Procedure/Radiological results at the follow up, please get all Hospital records sent to your Prim MD by signing hospital release before you go home.  If you experience worsening of your admission symptoms, develop shortness of breath,  life threatening emergency, suicidal or homicidal thoughts you must seek medical attention immediately by calling 911 or calling your MD immediately  if symptoms less severe.  You Must read complete instructions/literature along with all the possible adverse reactions/side effects for all the Medicines you take and that have been prescribed to you. Take any new Medicines after you have completely understood and accpet all the possible adverse reactions/side effects.   10/08/17 1148    vitamin B-12 (CYANOCOBALAMIN) 1000 MCG tablet  Daily     10/08/17 1158           Julianne Rice, MD 10/08/17 1505

## 2017-10-06 ENCOUNTER — Encounter (HOSPITAL_COMMUNITY): Payer: Self-pay | Admitting: Cardiology

## 2017-10-06 ENCOUNTER — Inpatient Hospital Stay (HOSPITAL_COMMUNITY): Payer: Medicare Other

## 2017-10-06 ENCOUNTER — Other Ambulatory Visit: Payer: Self-pay

## 2017-10-06 DIAGNOSIS — I471 Supraventricular tachycardia: Principal | ICD-10-CM

## 2017-10-06 LAB — LIPID PANEL
Cholesterol: 264 mg/dL — ABNORMAL HIGH (ref 0–200)
HDL: 44 mg/dL (ref >40)
LDL Cholesterol: 192 mg/dL — ABNORMAL HIGH (ref 0–99)
Total CHOL/HDL Ratio: 6 RATIO
Triglycerides: 142 mg/dL (ref <150)
VLDL: 28 mg/dL (ref 0–40)

## 2017-10-06 LAB — URINALYSIS, ROUTINE W REFLEX MICROSCOPIC
Bacteria, UA: NONE SEEN
Bilirubin Urine: NEGATIVE
Glucose, UA: NEGATIVE mg/dL
Hgb urine dipstick: NEGATIVE
Ketones, ur: NEGATIVE mg/dL
Nitrite: NEGATIVE
Protein, ur: NEGATIVE mg/dL
Specific Gravity, Urine: 1.014 (ref 1.005–1.030)
pH: 6 (ref 5.0–8.0)

## 2017-10-06 LAB — CBC
HCT: 29.7 % — ABNORMAL LOW (ref 36.0–46.0)
Hemoglobin: 9.3 g/dL — ABNORMAL LOW (ref 12.0–15.0)
MCH: 28.7 pg (ref 26.0–34.0)
MCHC: 31.3 g/dL (ref 30.0–36.0)
MCV: 91.7 fL (ref 78.0–100.0)
Platelets: 215 10*3/uL (ref 150–400)
RBC: 3.24 MIL/uL — ABNORMAL LOW (ref 3.87–5.11)
RDW: 16.2 % — ABNORMAL HIGH (ref 11.5–15.5)
WBC: 11.1 10*3/uL — ABNORMAL HIGH (ref 4.0–10.5)

## 2017-10-06 LAB — GLUCOSE, CAPILLARY
Glucose-Capillary: 158 mg/dL — ABNORMAL HIGH (ref 70–99)
Glucose-Capillary: 144 mg/dL — ABNORMAL HIGH (ref 70–99)

## 2017-10-06 LAB — HEPARIN LEVEL (UNFRACTIONATED)
Heparin Unfractionated: 0.96 IU/mL — ABNORMAL HIGH (ref 0.30–0.70)
Heparin Unfractionated: 0.15 IU/mL — ABNORMAL LOW (ref 0.30–0.70)

## 2017-10-06 LAB — TROPONIN I: Troponin I: 0.84 ng/mL (ref <0.03)

## 2017-10-06 LAB — MRSA PCR SCREENING: MRSA by PCR: NEGATIVE

## 2017-10-06 MED ORDER — ACETAMINOPHEN 325 MG PO TABS
650.0000 mg | ORAL_TABLET | Freq: Four times a day (QID) | ORAL | Status: DC | PRN
  Administered 2017-10-06 (×2): 650 mg via ORAL
  Filled 2017-10-06 (×2): qty 2

## 2017-10-06 MED ORDER — ASPIRIN EC 81 MG PO TBEC
81.0000 mg | DELAYED_RELEASE_TABLET | Freq: Every day | ORAL | Status: DC
  Administered 2017-10-06 – 2017-10-08 (×4): 81 mg via ORAL
  Filled 2017-10-06 (×4): qty 1

## 2017-10-06 MED ORDER — METOPROLOL TARTRATE 12.5 MG HALF TABLET
12.5000 mg | ORAL_TABLET | Freq: Two times a day (BID) | ORAL | Status: DC
  Administered 2017-10-06 – 2017-10-08 (×5): 12.5 mg via ORAL
  Filled 2017-10-06 (×6): qty 1

## 2017-10-06 MED ORDER — MECLIZINE HCL 25 MG PO TABS: 25.0000 mg | ORAL_TABLET | Freq: Three times a day (TID) | ORAL | Status: DC | PRN

## 2017-10-06 MED ORDER — HEPARIN BOLUS VIA INFUSION
4000.0000 [IU] | Freq: Once | INTRAVENOUS | Status: AC
  Administered 2017-10-06: 4000 [IU] via INTRAVENOUS
  Filled 2017-10-06: qty 4000

## 2017-10-06 MED ORDER — ONDANSETRON HCL 4 MG PO TABS: 4.0000 mg | ORAL_TABLET | Freq: Four times a day (QID) | ORAL | Status: DC | PRN

## 2017-10-06 MED ORDER — TECHNETIUM TC 99M DIETHYLENETRIAME-PENTAACETIC ACID
31.9000 | Freq: Once | INTRAVENOUS | Status: AC | PRN
  Administered 2017-10-06: 31.9 via RESPIRATORY_TRACT

## 2017-10-06 MED ORDER — PANTOPRAZOLE SODIUM 40 MG PO TBEC
40.0000 mg | DELAYED_RELEASE_TABLET | Freq: Every day | ORAL | Status: DC
  Administered 2017-10-06 – 2017-10-08 (×3): 40 mg via ORAL
  Filled 2017-10-06 (×4): qty 1

## 2017-10-06 MED ORDER — HEPARIN (PORCINE) IN NACL 100-0.45 UNIT/ML-% IJ SOLN
1050.0000 [IU]/h | INTRAMUSCULAR | Status: DC
  Administered 2017-10-06 (×3): 950 [IU]/h via INTRAVENOUS
  Administered 2017-10-06: 800 [IU]/h via INTRAVENOUS
  Filled 2017-10-06 (×2): qty 250

## 2017-10-06 MED ORDER — ACETAMINOPHEN 650 MG RE SUPP: 650.0000 mg | Freq: Four times a day (QID) | RECTAL | Status: DC | PRN

## 2017-10-06 MED ORDER — TECHNETIUM TO 99M ALBUMIN AGGREGATED
4.2800 | Freq: Once | INTRAVENOUS | Status: AC | PRN
  Administered 2017-10-06: 4.28 via INTRAVENOUS

## 2017-10-06 MED ORDER — MAGNESIUM OXIDE 400 (241.3 MG) MG PO TABS
400.0000 mg | ORAL_TABLET | Freq: Every day | ORAL | Status: DC
  Administered 2017-10-06 – 2017-10-08 (×3): 400 mg via ORAL
  Filled 2017-10-06 (×3): qty 1

## 2017-10-06 MED ORDER — ONDANSETRON HCL 4 MG/2ML IJ SOLN: 4.0000 mg | Freq: Four times a day (QID) | INTRAMUSCULAR | Status: DC | PRN

## 2017-10-06 MED ORDER — FESOTERODINE FUMARATE ER 8 MG PO TB24
8.0000 mg | ORAL_TABLET | Freq: Every day | ORAL | Status: DC
  Administered 2017-10-06 – 2017-10-08 (×3): 8 mg via ORAL
  Filled 2017-10-06 (×3): qty 1

## 2017-10-06 MED ORDER — LEVOTHYROXINE SODIUM 75 MCG PO TABS
150.0000 ug | ORAL_TABLET | Freq: Every day | ORAL | Status: DC
  Administered 2017-10-06 – 2017-10-08 (×3): 150 ug via ORAL
  Filled 2017-10-06 (×3): qty 2

## 2017-10-06 MED ORDER — ATORVASTATIN CALCIUM 40 MG PO TABS
40.0000 mg | ORAL_TABLET | Freq: Every day | ORAL | Status: DC
  Filled 2017-10-06 (×2): qty 1

## 2017-10-06 MED ORDER — GABAPENTIN 300 MG PO CAPS
300.0000 mg | ORAL_CAPSULE | Freq: Every day | ORAL | Status: DC
  Administered 2017-10-06 – 2017-10-07 (×2): 300 mg via ORAL
  Filled 2017-10-06 (×2): qty 1

## 2017-10-06 NOTE — Consult Note (Signed)
[facility] Consult Note   Primary Physician:  Felipa Emory Primary Cardiologist:   Jacqulyn Ducking  Reason for Consultation:  Elevated troponin  HPI:    Kim Mills is a 77 year old female with a past medical history significant for hypertension, hyperlipidemia, type 2 diabetes mellitus, GERD, osteoarthritis, hypothyroidism and obesity who presents with complaints of fatigue and exertional shortness of breath. The patient complains off generalized weakness and easy fatigability for the past 2 years. She has been noticing that she has difficulty walking because of decreased strength in her legs. The patient herself feels that this might be due to her hypothyroidism. The last time her synthroid dose was adjusted was approximately 1 year ago. She denies any chest pain. She has also not had any palpitations, presyncope or syncope. She does not endorse any heart failure symptoms. She does have swelling on the dorsum of her feet. She is also struggling with off-and-on nausea, vomiting and heartburn. Her appetite however has been good and her weight is stable.  Recently on the advice of her primary care physician she was set up for home physical therapy. Yesterday the therapist noted that even though her blood pressure was normal her heart rate was approximately 200 bpm. She was therefore referred to the emergency department for further management.  In the ED the patient was noted to have narrow complex tachycardia. Initially vagal maneuvers were attempted without success. The patient was given 6 mg of adenosine that terminated the supraventricular tachycardia (SVT). Once the patient converted to sinus rhythm the ECG did not reveal any significant ischemic changes (such as ST elevations or depressions). The troponin has slightly trended up from 0.77 to 1.25. The patient denies any chest pain.   Previous cardiac testing  Myocardial perfusion imaging 09/24/2010 Perfusion imaging was overall  reassuring with no large reversible perfusion defects to indicate ischemia, and some evidence of mild breast tissue attenuation. Interestingly the patient developed an SVT during the stress test.   Home Medications Prior to Admission medications   Medication Sig Start Date End Date Taking? Authorizing Provider  gabapentin (NEURONTIN) 300 MG capsule Take 300 mg by mouth at bedtime.    Yes [provider]  Garlic (GARLIQUE) 259 MG TBEC Take 1 tablet by mouth every evening.   Yes [provider]  glipiZIDE (GLUCOTROL) 5 MG tablet Take 1 tablet by mouth 2 (two) times daily. 02/09/14  Yes [provider]  meclizine (ANTIVERT) 25 MG tablet Take 25 mg by mouth 3 (three) times daily as needed for dizziness.   Yes [provider]  metFORMIN (GLUCOPHAGE) 500 MG tablet Take 1,000 mg by mouth 2 (two) times daily.  02/09/14  Yes [provider]  naproxen (NAPROSYN) 500 MG tablet Take 500 mg by mouth daily as needed for mild pain.    Yes [provider]  Omega-3 Fatty Acids (OMEGA-3 FISH OIL PO) Take 1 capsule by mouth every evening.   Yes [provider]  omeprazole (PRILOSEC) 40 MG capsule Take 1 capsule (40 mg total) by mouth daily. 03/13/14  Yes Carlis Stable, NP  SYNTHROID 150 MCG tablet Take 1 tablet by mouth daily. 02/21/14  Yes [provider]  tolterodine (DETROL LA) 4 MG 24 hr capsule Take 4 mg by mouth daily.   Yes [provider]  UNKNOWN TO PATIENT Take 1 tablet by mouth daily. Blood Pressure   Yes [provider]    Past Medical History: Past Medical History:  Diagnosis Date  . Arthritis   . Cataract   . Chest pain    Associated with weakness and fatigue  . Diabetes mellitus    A1c of 7.4 in 08/2010  . Glaucoma   . Hyperlipidemia    Lipid profile in 08/2010:190, 121, 48, 118; normal CBC and CMet  . Hypertension    Lipid profile in 08/2010:190, 121, 48, 118; normal CBC and CMet  . Hypothyroidism   .  Obesity     Past Surgical History: Past Surgical History:  Procedure Laterality Date  . APPENDECTOMY    . CHOLECYSTECTOMY    . COLONOSCOPY N/A 04/01/2014   RMR: Melanosis coli. colonic polyps removed as described above.   . THYROIDECTOMY, PARTIAL      Family History: Family History  Problem Relation Age of Onset  . Heart block Mother   . Uterine cancer Sister   . Diabetes Sister     Social History: Social History   Socioeconomic History  . Marital status: Widowed    Spouse name: Not on file  . Number of children: Not on file  . Years of education: Not on file  . Highest education level: Not on file  Occupational History  . Not on file  Social Needs  . Financial resource strain: Not on file  . Food insecurity:    Worry: Not on file    Inability: Not on file  . Transportation needs:    Medical: Not on file    Non-medical: Not on file  Tobacco Use  . Smoking status: Never Smoker  . Smokeless tobacco: Never Used  Substance and Sexual Activity  . Alcohol use: Never    Frequency: Never  . Drug use: Never  . Sexual activity: Not Currently  Lifestyle  . Physical activity:    Days per week: Not on file    Minutes per session: Not on file  . Stress: Not on file  Relationships  . Social connections:    Talks on phone: Not on file    Gets together: Not on file    Attends religious service: Not on file    Active member of club or organization: Not on file    Attends meetings of clubs or organizations: Not on file    Relationship status: Not on file  Other Topics Concern  . Not on file  Social History Narrative  . Not on file    Allergies:  No Known Allergies   Review of Systems: [y] = yes, [ ]  = no   . General: Weight gain [ ] ; Weight loss [ ] ; Anorexia [ ] ; Fatigue [ y]; Fever [ ] ; Chills [ ] ; Weakness Blue.Reese ]  . Cardiac: Chest pain/pressure [ ] ; Resting SOB [ ] ; Exertional SOB [ ] ; Orthopnea [ ] ; Pedal Edema Blue.Reese ]; Palpitations [ ] ; Syncope [ ] ; Presyncope [  ]; Paroxysmal nocturnal dyspnea[ ]   . Pulmonary: Cough [ ] ; Wheezing[ ] ; Hemoptysis[ ] ; Sputum Blue.Reese ]; Snoring [ ]   . GI: Vomiting[y ]; Dysphagia[ ] ; Melena[ ] ; Hematochezia [ ] ; Heartburn[y ]; Abdominal pain [ ] ; Constipation Blue.Reese ]; Diarrhea [ ] ; BRBPR [ ]   . GU: Hematuria[ ] ; Dysuria [ ] ; Nocturia[ ]   . Vascular: Pain in legs with walking Blue.Reese ]; Pain in feet with lying flat [ ] ; Non-healing sores [ ] ; Stroke [ ] ; TIA [ ] ; Slurred speech [ ] ;  . Neuro: Headaches[ ] ; Vertigo[ ] ; Seizures[ ] ; Paresthesias[ ] ;Blurred vision [ ] ; Diplopia [ ] ; Vision changes [ ]   .  Ortho/Skin: Arthritis [ ] ; Joint pain [ ] ; Muscle pain Blue.Reese ]; Joint swelling [ ] ; Back Pain [ ] ; Rash [ ]   . Psych: Depression[ ] ; Anxiety[ ]   . Heme: Bleeding problems [ ] ; Clotting disorders [ ] ; Anemia [ ]   . Endocrine: Diabetes [ ] ; Thyroid dysfunction[y ]     Objective:    Vital Signs:   Temp:  [97.7 F (36.5 C)-98.2 F (36.8 C)] 97.7 F (36.5 C) (08/29 0221) Pulse Rate:  [75-184] 75 (08/29 0220) Resp:  [12-21] 14 (08/29 0115) BP: (91-149)/(49-77) 145/54 (08/29 0220) SpO2:  [97 %-100 %] 99 % (08/29 0115) Weight:  [88 kg-88.2 kg] 88.2 kg (08/29 0221)    Weight change: Filed Weights   10/05/17 1832 10/06/17 0221  Weight: 88 kg 88.2 kg    Intake/Output:   Intake/Output Summary (Last 24 hours) at 10/06/2017 0222 Last data filed at 10/05/2017 2335 Gross per 24 hour  Intake 100 ml  Output -  Net 100 ml      Physical Exam    General:  Well appearing. No resp difficulty HEENT: normal Neck: supple. JVP . Carotids 2+ bilat; no bruits. No lymphadenopathy or thyromegaly appreciated. Cor: PMI nondisplaced. Regular rate & rhythm. No rubs, gallops or murmurs. Lungs: clear Abdomen: soft, nontender, nondistended. No hepatosplenomegaly. No bruits or masses. Good bowel sounds. Extremities: no cyanosis, clubbing, rash, edema Neuro: alert & orientedx3, cranial nerves grossly intact. moves all 4 extremities w/o difficulty. Affect  pleasant    EKG    Sinus tachycardia, ventricular rate 100 bpm, minimal ST depression in lead III and aVF   Labs   Basic Metabolic Panel: Recent Labs  Lab 10/05/17 1845  NA 139  K 4.0  CL 106  CO2 24  GLUCOSE 155*  BUN 27*  CREATININE 1.29*  CALCIUM 9.2  MG 1.8    Liver Function Tests: Recent Labs  Lab 10/05/17 1845  AST 26  ALT 20  ALKPHOS 67  BILITOT 0.5  PROT 7.8  ALBUMIN 4.1   No results for input(s): LIPASE, AMYLASE in the last 168 hours. No results for input(s): AMMONIA in the last 168 hours.  CBC: Recent Labs  Lab 10/05/17 1845  WBC 13.6*  NEUTROABS 6.1  HGB 11.3*  HCT 35.1*  MCV 89.5  PLT 281    Cardiac Enzymes: Recent Labs  Lab 10/05/17 1845 10/05/17 2209  TROPONINI 0.77* 1.25*    BNP: BNP (last 3 results) Recent Labs    10/05/17 1845  BNP 138.0*    ProBNP (last 3 results) No results for input(s): PROBNP in the last 8760 hours.   CBG: No results for input(s): GLUCAP in the last 168 hours.  Coagulation Studies: No results for input(s): LABPROT, INR in the last 72 hours.   Imaging   Dg Chest 2 View - Result Date: 10/05/2017 FINDINGS: External pacing pads are present. Cardiac silhouette normal in size. Thoracic aorta mildly atherosclerotic. Hilar and mediastinal contours otherwise unremarkable. Lungs clear. Bronchovascular markings normal. Pulmonary vascularity normal. No visible pleural effusions. No pneumothorax. Degenerative changes and DISH involving the thoracic spine.  IMPRESSION: No acute cardiopulmonary disease.    Medications:     Current Medications: . fesoterodine  8 mg Oral Daily  . gabapentin  300 mg Oral QHS  . levothyroxine  150 mcg Oral QAC breakfast  . metoprolol tartrate  12.5 mg Oral BID  . pantoprazole  40 mg Oral Daily     Assessment/Plan   1. Supraventricular tachycardia The patient at presentation  was documented to have an SVT with a heart rate in the 170s. After the failure of initial  vagal maneuvers she was treated with 6 mg of IV adenosine that terminated the arrhythmia.  - Admit to telemetry for further observation - Consider beta blockers to suppress further episodes - If there is no evidence of systolic LV dysfunction one can also use a calcium channel blocker chronically to prevent recurrences - To assess her chronic burden off SVT (as an outpatient) consider placement of an event monitor or an implantable loop recorder - Check serum potassium and magnesium and replace as necessary   2. Elevated troponin  - Trend cardiac biomarkers and obtain serial ECGs - Start aspirin 81 mg daily - Start high dose statins (such as Atorvastatin 80 mg nightly). Check a lipid panel in the morning. - Consider intravenous unfractionated heparin per pharmacy protocol - Obtain a transthoracic echocardiogram to evaluate LV systolic and diastolic function.    Meade Maw, MD  10/06/2017, 2:22 AM  Cardiology Overnight Team Please contact [facility] for night-coverage after hours (4p -7a ) and weekends on [email or web address]

## 2017-10-06 NOTE — Progress Notes (Signed)
CRITICAL VALUE ALERT  Critical Value: Troponin 0.84  Date & Time Notied: 10/06/2017 1100  Provider Notified: Dr. Marthenia Rolling  Orders Received/Actions taken: Still awaiting orders/action

## 2017-10-06 NOTE — Progress Notes (Signed)
ANTICOAGULATION CONSULT NOTE - Initial Consult  Pharmacy Consult for heparin Indication: chest pain/ACS  No Known Allergies  Patient Measurements: Height: 5\' 2"  (157.5 cm) Weight: 194 lb 7.1 oz (88.2 kg)(Scale A) IBW/kg (Calculated) : 50.1 Heparin Dosing Weight: 71 kg   Vital Signs: Temp: 97.7 F (36.5 C) (08/29 0221) Temp Source: Oral (08/29 0221) BP: 145/54 (08/29 0221) Pulse Rate: 75 (08/29 0221)  Labs: Recent Labs    10/05/17 1845 10/05/17 2209  HGB 11.3*  --   HCT 35.1*  --   PLT 281  --   CREATININE 1.29*  --   TROPONINI 0.77* 1.25*    Estimated Creatinine Clearance: 39.4 mL/min (A) (by C-G formula based on SCr of 1.29 mg/dL (H)).   Medical History: Past Medical History:  Diagnosis Date  . Arthritis   . Cataract   . Chest pain    Associated with weakness and fatigue  . Diabetes mellitus    A1c of 7.4 in 08/2010  . Glaucoma   . Hyperlipidemia    Lipid profile in 08/2010:190, 121, 48, 118; normal CBC and CMet  . Hypertension    Lipid profile in 08/2010:190, 121, 48, 118; normal CBC and CMet  . Hypothyroidism   . Obesity     Medications:  Medications Prior to Admission  Medication Sig Dispense Refill Last Dose  . gabapentin (NEURONTIN) 300 MG capsule Take 300 mg by mouth at bedtime.    10/04/2017 at Unknown time  . Garlic (GARLIQUE) 628 MG TBEC Take 1 tablet by mouth every evening.   Past Week at Unknown time  . glipiZIDE (GLUCOTROL) 5 MG tablet Take 1 tablet by mouth 2 (two) times daily.   10/05/2017 at Unknown time  . meclizine (ANTIVERT) 25 MG tablet Take 25 mg by mouth 3 (three) times daily as needed for dizziness.   unknown  . metFORMIN (GLUCOPHAGE) 500 MG tablet Take 1,000 mg by mouth 2 (two) times daily.    10/05/2017 at Unknown time  . naproxen (NAPROSYN) 500 MG tablet Take 500 mg by mouth daily as needed for mild pain.    Past Week at Unknown time  . Omega-3 Fatty Acids (OMEGA-3 FISH OIL PO) Take 1 capsule by mouth every evening.   Past Week at  Unknown time  . omeprazole (PRILOSEC) 40 MG capsule Take 1 capsule (40 mg total) by mouth daily. 90 capsule 3 10/05/2017 at Unknown time  . SYNTHROID 150 MCG tablet Take 1 tablet by mouth daily.   10/05/2017 at Unknown time  . tolterodine (DETROL LA) 4 MG 24 hr capsule Take 4 mg by mouth daily.   10/05/2017 at Unknown time  . UNKNOWN TO PATIENT Take 1 tablet by mouth daily. Blood Pressure   10/05/2017 at Unknown time    Assessment: 69 YOF who presented as a transfer from [facility] with increasing dyspnea and exertion with SVT and heart rate in the 200s. Troponins are elevated. Pharmacy consulted to start IV heparin. CBC reviewed. She was not on anticoagulation at home.   Goal of Therapy:  Heparin level 0.3-0.7 units/ml Monitor platelets by anticoagulation protocol: Yes   Plan:  -Heparin 4000 units IV bolus, then start heparin infusion at 950 units/hr -F/u 8 hr HL -Monitor daily CBC, HL and s/s of bleeding   Albertina Parr, PharmD., BCPS Clinical Pharmacist Clinical phone for 10/06/17 until 8AM: [phone]

## 2017-10-06 NOTE — Progress Notes (Signed)
Progress Note  Patient Name: Kim Mills Date of Encounter: 10/06/2017  Primary Cardiologist: No primary care provider on file.   Subjective   Not in room  Inpatient Medications    Scheduled Meds: . aspirin EC  81 mg Oral Daily  . fesoterodine  8 mg Oral Daily  . gabapentin  300 mg Oral QHS  . levothyroxine  150 mcg Oral QAC breakfast  . magnesium oxide  400 mg Oral Daily  . metoprolol tartrate  12.5 mg Oral BID  . pantoprazole  40 mg Oral Daily   Continuous Infusions: . heparin 950 Units/hr (10/06/17 0259)   PRN Meds: acetaminophen **OR** acetaminophen, meclizine, ondansetron **OR** ondansetron (ZOFRAN) IV   Vital Signs    Vitals:   10/06/17 0500 10/06/17 0600 10/06/17 0700 10/06/17 0800  BP:   (!) 125/58 (!) 130/52  Pulse: 61 (!) 59 64   Resp: 17 17 11    Temp:    (!) 97.5 F (36.4 C)  TempSrc:    Oral  SpO2: 97% 98% 96% 98%  Weight:      Height:        Intake/Output Summary (Last 24 hours) at 10/06/2017 0935 Last data filed at 10/06/2017 0700 Gross per 24 hour  Intake 380 ml  Output 850 ml  Net -470 ml   Filed Weights   10/05/17 1832 10/06/17 0221  Weight: 88 kg 88.2 kg    Telemetry    SR - Personally Reviewed  ECG    No new - Personally Reviewed  Physical Exam   Per Dr. Harrington Challenger  Labs    Chemistry Recent Labs  Lab 10/05/17 1845  NA 139  K 4.0  CL 106  CO2 24  GLUCOSE 155*  BUN 27*  CREATININE 1.29*  CALCIUM 9.2  PROT 7.8  ALBUMIN 4.1  AST 26  ALT 20  ALKPHOS 67  BILITOT 0.5  GFRNONAA 40*  GFRAA 46*  ANIONGAP 9     Hematology Recent Labs  Lab 10/05/17 1845 10/06/17 0343  WBC 13.6* 11.1*  RBC 3.92 3.24*  HGB 11.3* 9.3*  HCT 35.1* 29.7*  MCV 89.5 91.7  MCH 28.8 28.7  MCHC 32.2 31.3  RDW 16.5* 16.2*  PLT 281 215    Cardiac Enzymes Recent Labs  Lab 10/05/17 1845 10/05/17 2209  TROPONINI 0.77* 1.25*   No results for input(s): TROPIPOC in the last 168 hours.   BNP Recent Labs  Lab 10/05/17 1845  BNP  138.0*     DDimer  Recent Labs  Lab 10/05/17 1845  DDIMER 0.51*     Radiology    Dg Chest 2 View  Result Date: 10/05/2017 CLINICAL DATA:  Acute onset of tachycardia with heart rate up to 200 that began earlier today. Shortness of breath with ambulation. Current history of diabetes and hypertension. EXAM: CHEST - 2 VIEW COMPARISON:  None. FINDINGS: External pacing pads are present. Cardiac silhouette normal in size. Thoracic aorta mildly atherosclerotic. Hilar and mediastinal contours otherwise unremarkable. Lungs clear. Bronchovascular markings normal. Pulmonary vascularity normal. No visible pleural effusions. No pneumothorax. Degenerative changes and DISH involving the thoracic spine. IMPRESSION: No acute cardiopulmonary disease. Electronically Signed   By: Evangeline Dakin M.D.   On: 10/05/2017 19:53    Cardiac Studies   Echo pending  Previous cardiac testing Myocardial perfusion imaging 09/24/2010 Perfusion imaging was overall reassuring with no large reversible perfusion defects to indicate ischemia, and some evidence of mild breast tissue attenuation. Interestingly the patient developed an SVT during  the stress test.   Patient Profile     77 y.o. female with hx of HTN, HLD, DM-2, GERD, osteoarthritis, hypothyroidism and admitted after PT working with her at home for therapy and HR was 200.  Sent to ER - pt complained of DOE.   + SVT.  Broke with 6 mg adenosine.  + elevated troponin of 1.25.  Assessment & Plan    SVT maintaining SR --now on lopressor 12.5 mg BID. --TSH 1.479,  --DDimer 0.51--VQ scan ordered  Elevated troponin   --0.77 and 1.25 - another is pending, no chest pain.  Could be from SVT demand ischemia, awaiting Echo results.  HLD add statin and check lipids   HTN controlled.    Anemia, Hgb 11.3 on admit and now 9.3 on IV heparin with elevated Troponin and possible PE.       For questions or updates, please contact [facility] Please consult  [email or web address] for contact info under Cardiology/STEMI.      Signed, Cecilie Kicks, NP  10/06/2017, 9:35 AM

## 2017-10-06 NOTE — Progress Notes (Signed)
ANTICOAGULATION CONSULT NOTE   Pharmacy Consult for heparin Indication: chest pain/ACS  No Known Allergies  Patient Measurements: Height: 5\' 2"  (157.5 cm) Weight: 194 lb 7.1 oz (88.2 kg)(Scale A) IBW/kg (Calculated) : 50.1 Heparin Dosing Weight: 71 kg   Vital Signs: Temp: 98.1 F (36.7 C) (08/29 2007) Temp Source: Oral (08/29 2007) BP: 142/54 (08/29 2007) Pulse Rate: 63 (08/29 2007)  Labs: Recent Labs    10/05/17 1845 10/05/17 2209 10/06/17 0343 10/06/17 0944 10/06/17 1213 10/06/17 2129  HGB 11.3*  --  9.3*  --   --   --   HCT 35.1*  --  29.7*  --   --   --   PLT 281  --  215  --   --   --   HEPARINUNFRC  --   --   --   --  0.96* 0.15*  CREATININE 1.29*  --   --   --   --   --   TROPONINI 0.77* 1.25*  --  0.84*  --   --     Estimated Creatinine Clearance: 39.4 mL/min (A) (by C-G formula based on SCr of 1.29 mg/dL (H)).   Medical History: Past Medical History:  Diagnosis Date  . Arthritis   . Cataract   . Chest pain    Associated with weakness and fatigue  . Diabetes mellitus    A1c of 7.4 in 08/2010  . Glaucoma   . Hyperlipidemia    Lipid profile in 08/2010:190, 121, 48, 118; normal CBC and CMet  . Hypertension    Lipid profile in 08/2010:190, 121, 48, 118; normal CBC and CMet  . Hypothyroidism   . Obesity     Medications:  Medications Prior to Admission  Medication Sig Dispense Refill Last Dose  . gabapentin (NEURONTIN) 300 MG capsule Take 300 mg by mouth at bedtime.    10/04/2017 at Unknown time  . Garlic (GARLIQUE) 161 MG TBEC Take 1 tablet by mouth every evening.   Past Week at Unknown time  . glipiZIDE (GLUCOTROL) 5 MG tablet Take 1 tablet by mouth 2 (two) times daily.   10/05/2017 at Unknown time  . losartan-hydrochlorothiazide (HYZAAR) 100-12.5 MG tablet Take 1 tablet by mouth daily.   10/05/2017 at Unknown time  . meclizine (ANTIVERT) 25 MG tablet Take 25 mg by mouth 3 (three) times daily as needed for dizziness.   unknown  . metFORMIN (GLUCOPHAGE)  500 MG tablet Take 1,000 mg by mouth 2 (two) times daily.    10/05/2017 at Unknown time  . naproxen (NAPROSYN) 500 MG tablet Take 500 mg by mouth daily as needed for mild pain.    Past Week at Unknown time  . Omega-3 Fatty Acids (OMEGA-3 FISH OIL PO) Take 1 capsule by mouth every evening.   Past Week at Unknown time  . omeprazole (PRILOSEC) 40 MG capsule Take 1 capsule (40 mg total) by mouth daily. 90 capsule 3 10/05/2017 at Unknown time  . SYNTHROID 150 MCG tablet Take 1 tablet by mouth daily.   10/05/2017 at Unknown time  . tolterodine (DETROL LA) 4 MG 24 hr capsule Take 4 mg by mouth daily.   10/05/2017 at Unknown time  . UNKNOWN TO PATIENT Take 1 tablet by mouth daily. Blood Pressure   10/05/2017 at Unknown time    Assessment: 24 YOF who presented as a transfer from [facility] with increasing dyspnea and exertion with SVT and heart rate in the 200s. Troponins are elevated. Pharmacy consulted to start IV heparin.  CBC reviewed. She was not on anticoagulation at home.   Repeat heparin level low: 0.15, No infusion issues or overt bleeding per RN  Goal of Therapy:  Heparin level 0.3-0.7 units/ml Monitor platelets by anticoagulation protocol: Yes   Plan:  -Increase heparin gtt to 950 units/hr -F/u 8 hr heparin level -Monitor daily CBC, heparin level and s/s of bleeding   Georga Bora, PharmD Clinical Pharmacist 10/06/2017 10:19 PM Please check AMION for all [place] numbers

## 2017-10-06 NOTE — Progress Notes (Signed)
Patient admitted from [facility]. Alert and oriented. Family at bedside. Vitals stable. Denies pain. Urine collected and sent to lab. Heparin drip started. Oriented to room and surroundings. On call paged and responded. Cardiology in the room assessing patient.

## 2017-10-06 NOTE — Progress Notes (Signed)
Progress Note  Patient Name: Kim Mills Date of Encounter: 10/06/2017  Primary Cardiologist: =[place] 2012    Subjective   Breathing is OK at rest   NO CP    Inpatient Medications    Scheduled Meds: . aspirin EC  81 mg Oral Daily  . fesoterodine  8 mg Oral Daily  . gabapentin  300 mg Oral QHS  . levothyroxine  150 mcg Oral QAC breakfast  . magnesium oxide  400 mg Oral Daily  . metoprolol tartrate  12.5 mg Oral BID  . pantoprazole  40 mg Oral Daily   Continuous Infusions: . heparin 950 Units/hr (10/06/17 0259)   PRN Meds: acetaminophen **OR** acetaminophen, meclizine, ondansetron **OR** ondansetron (ZOFRAN) IV   Vital Signs    Vitals:   10/06/17 0500 10/06/17 0600 10/06/17 0700 10/06/17 0800  BP:   (!) 125/58 (!) 130/52  Pulse: 61 (!) 59 64   Resp: 17 17 11    Temp:    (!) 97.5 F (36.4 C)  TempSrc:    Oral  SpO2: 97% 98% 96% 98%  Weight:      Height:        Intake/Output Summary (Last 24 hours) at 10/06/2017 1052 Last data filed at 10/06/2017 0700 Gross per 24 hour  Intake 380 ml  Output 850 ml  Net -470 ml   Filed Weights   10/05/17 1832 10/06/17 0221  Weight: 88 kg 88.2 kg    Telemetry    SVT  X 1   Now SR   - Personally Reviewed  ECG      Physical Exam   GEN:  Obese 77 yo  acute distress.   Neck: No JVD Cardiac: RRR, no murmurs, rubs, or gallops.  Respiratory: Clear to auscultation bilaterally. GI: Soft, nontender, non-distended  MS: No edema; No deformity. Neuro:  Nonfocal  Psych: Normal affect   Labs    Chemistry Recent Labs  Lab 10/05/17 1845  NA 139  K 4.0  CL 106  CO2 24  GLUCOSE 155*  BUN 27*  CREATININE 1.29*  CALCIUM 9.2  PROT 7.8  ALBUMIN 4.1  AST 26  ALT 20  ALKPHOS 67  BILITOT 0.5  GFRNONAA 40*  GFRAA 46*  ANIONGAP 9     Hematology Recent Labs  Lab 10/05/17 1845 10/06/17 0343  WBC 13.6* 11.1*  RBC 3.92 3.24*  HGB 11.3* 9.3*  HCT 35.1* 29.7*  MCV 89.5 91.7  MCH 28.8 28.7  MCHC 32.2 31.3    RDW 16.5* 16.2*  PLT 281 215    Cardiac Enzymes Recent Labs  Lab 10/05/17 1845 10/05/17 2209  TROPONINI 0.77* 1.25*   No results for input(s): TROPIPOC in the last 168 hours.   BNP Recent Labs  Lab 10/05/17 1845  BNP 138.0*     DDimer  Recent Labs  Lab 10/05/17 1845  DDIMER 0.51*     Radiology    Dg Chest 2 View  Result Date: 10/05/2017 CLINICAL DATA:  Acute onset of tachycardia with heart rate up to 200 that began earlier today. Shortness of breath with ambulation. Current history of diabetes and hypertension. EXAM: CHEST - 2 VIEW COMPARISON:  None. FINDINGS: External pacing pads are present. Cardiac silhouette normal in size. Thoracic aorta mildly atherosclerotic. Hilar and mediastinal contours otherwise unremarkable. Lungs clear. Bronchovascular markings normal. Pulmonary vascularity normal. No visible pleural effusions. No pneumothorax. Degenerative changes and DISH involving the thoracic spine. IMPRESSION: No acute cardiopulmonary disease. Electronically Signed   By: Evangeline Dakin  M.D.   On: 10/05/2017 19:53    Cardiac Studies     Patient Profile     77 y.o. female with history of CP in past  , HL   Presented with SVT   Converted with adenosine IV   Assessment & Plan    1  SVT  Pt did not sens palpitations   Says that the elevated HR was picked up by therapist  She denied dizziness   No CP   Does routinely get SOB with activity   Follow   Agree with b blocker   Titrate as BP and HR allow     2  Elev troponin  May reflect demand ischemia in setting of HTN and SVT Her history is concerning though   She has a long history of SOB with activity which she says is getting worse  For about 1 year   Says she has to walk very slow "like old [place] used to do"  Even then SOB That along with history of DM, HL (Now off of statin LDL 190s) and HTN makes above history highly suspicious for CAD   (Equiv of positive stress test) I have discussed this with family and patient    I would favor L heart cath to define anatomy   Risks / benefits described   Pt understands   She is reflecting.   Will keep NPO after MN and discuss in am Pt has echo pending but even if LVEF normal would still recomm L heart cath    For questions or updates, please contact [place] Please consult [email or web address] for contact info under Cardiology/STEMI.      Signed, Dorris Carnes, MD  10/06/2017, 10:52 AM

## 2017-10-06 NOTE — Plan of Care (Signed)
  Problem: Clinical Measurements: Goal: Respiratory complications will improve Outcome: Progressing   Problem: Activity: Goal: Risk for activity intolerance will decrease Outcome: Progressing   Problem: Coping: Goal: Level of anxiety will decrease Outcome: Progressing   Problem: Pain Managment: Goal: General experience of comfort will improve Outcome: Progressing   

## 2017-10-06 NOTE — Progress Notes (Signed)
PROGRESS NOTE    Kim Mills  YTK:[id number] DOB: Jun 28, 1940 DOA: 10/05/2017 PCP: Rosita Fire, MD  Outpatient Specialists:     Brief Narrative:  Kim Mills is a 77 y.o. female with past medical history significant for osteoarthritis, cataracts, glaucoma, history of chest pain, hypothyroidism, obesity, hyperlipidemia, hypertension, type 2 diabetes.  Patient was found to have a heart rate of 200 bpm while patient was undergoing preliminary evaluation for physical therapy.  Patient was advised to come to the hospital for further assessment and management. When the patient arrived to the emergency department her heart rate was recorded at 188 bpm on her EKG. The patient stated that she had been feeling progressively more fatigued in the past 2 years, however she has had severe dyspnea in the past few weeks with minimal exertion.  She gets very fatigued and dyspneic, even when walking inside her home.  She has had worsening edema of the lower extremities as well.  She denies PND, but complains of some orthopnea.  No chest pain, chest pressures or palpitations.  Patient is currently on beta-blocker and heart rate is currently controlled.  Audiology team is directing patient's management.  Echocardiogram is pending.  Patient may need a 30-day Holter monitoring on discharge or Linq loop device.  Assessment & Plan:   Principal Problem:   SVT (supraventricular tachycardia) (HCC) Active Problems:   Type 2 diabetes mellitus (HCC)   Hypothyroidism   Hypertension   Hyperlipidemia   Constipation   GERD (gastroesophageal reflux disease)   SVT (supraventricular tachycardia) (HCC) Admit to [facility]/stepdown/inpatient. Continue supplemental oxygen. Trend troponin level. Optimize electrolytes. Start low-dose metoprolol. Check echocardiogram in a.m. Cardiology to evaluate. 10/06/2017: Work-up is in progress.  Heart rate is currently controlled.  Cardiology input is appreciated.  Elevated  troponin Continue level trending. Check echocardiogram. Given risk factors, suspect underlying CAD. Heparin per pharmacy protocol. Cardiology to evaluate. 10/06/2017: Troponin is trending downwards.  Elevated troponin is likely secondary to SVT.  Hypothyroidism TSH within normal limits. Continue levothyroxine 150 mcg p.o. daily. Follow-up TSH as needed. 10/06/2017 TSH is 1.479.  Hypertension Start metoprolol 12.5 mg p.o. twice daily. Monitor blood pressure and heart rate.  Hyperlipidemia Currently not taking medication. On garlic and fish oil supplement at home. 10/06/2017: Pressure is reasonably controlled.  GERD (gastroesophageal reflux disease) Protonix 40 mg p.o. daily.  Type 2 diabetes mellitus (HCC) Hold glipizide and metformin while n.p.o. CBG monitoring every 6 hours.  Constipation Start magnesium oxide 400 mg p.o. daily.   DVT prophylaxis: Lovenox SQ. Code Status: Full code. Family Communication: Also present in the ED her two sons and her daughter. Disposition Plan: Admit to [facility] for cardiology evaluation. Consults called: Cardiology on-call.  Procedures:   Echo is pending  Antimicrobials:   None   Subjective: No shortness of breath No chest pain. No palpitations. No fever or chills.  Objective: Vitals:   10/06/17 0500 10/06/17 0600 10/06/17 0700 10/06/17 0800  BP:   (!) 125/58 (!) 130/52  Pulse: 61 (!) 59 64   Resp: 17 17 11    Temp:    (!) 97.5 F (36.4 C)  TempSrc:    Oral  SpO2: 97% 98% 96% 98%  Weight:      Height:        Intake/Output Summary (Last 24 hours) at 10/06/2017 1700 Last data filed at 10/06/2017 0700 Gross per 24 hour  Intake 380 ml  Output 850 ml  Net -470 ml   Filed Weights   10/05/17 1832  10/06/17 0221  Weight: 88 kg 88.2 kg    Examination:  General exam: Appears calm and comfortable  Respiratory system: Clear to auscultation. Respiratory effort normal. Cardiovascular system: S1 & S2  heard. Gastrointestinal system: Abdomen is nondistended, soft and nontender. No organomegaly or masses felt. Normal bowel sounds heard. Central nervous system: Alert and oriented. No focal neurological deficits. Extremities: No leg edema.  Psychiatry: Judgement and insight appear normal. Mood & affect appropriate.     Data Reviewed: I have personally reviewed following labs and imaging studies  CBC: Recent Labs  Lab 10/05/17 1845 10/06/17 0343  WBC 13.6* 11.1*  NEUTROABS 6.1  --   HGB 11.3* 9.3*  HCT 35.1* 29.7*  MCV 89.5 91.7  PLT 281 235   Basic Metabolic Panel: Recent Labs  Lab 10/05/17 1845  NA 139  K 4.0  CL 106  CO2 24  GLUCOSE 155*  BUN 27*  CREATININE 1.29*  CALCIUM 9.2  MG 1.8   GFR: Estimated Creatinine Clearance: 39.4 mL/min (A) (by C-G formula based on SCr of 1.29 mg/dL (H)). Liver Function Tests: Recent Labs  Lab 10/05/17 1845  AST 26  ALT 20  ALKPHOS 67  BILITOT 0.5  PROT 7.8  ALBUMIN 4.1   No results for input(s): LIPASE, AMYLASE in the last 168 hours. No results for input(s): AMMONIA in the last 168 hours. Coagulation Profile: No results for input(s): INR, PROTIME in the last 168 hours. Cardiac Enzymes: Recent Labs  Lab 10/05/17 1845 10/05/17 2209 10/06/17 0944  TROPONINI 0.77* 1.25* 0.84*   BNP (last 3 results) No results for input(s): PROBNP in the last 8760 hours. HbA1C: No results for input(s): HGBA1C in the last 72 hours. CBG: Recent Labs  Lab 10/06/17 0234 10/06/17 0617  GLUCAP 158* 144*   Lipid Profile: Recent Labs    10/06/17 1213  CHOL 264*  HDL 44  LDLCALC 192*  TRIG 142  CHOLHDL 6.0   Thyroid Function Tests: Recent Labs    10/05/17 1845  TSH 1.479   Anemia Panel: No results for input(s): VITAMINB12, FOLATE, FERRITIN, TIBC, IRON, RETICCTPCT in the last 72 hours. Urine analysis:    Component Value Date/Time   COLORURINE YELLOW 10/06/2017 0254   APPEARANCEUR HAZY (A) 10/06/2017 0254   LABSPEC 1.014  10/06/2017 0254   PHURINE 6.0 10/06/2017 0254   GLUCOSEU NEGATIVE 10/06/2017 0254   HGBUR NEGATIVE 10/06/2017 0254   BILIRUBINUR NEGATIVE 10/06/2017 0254   KETONESUR NEGATIVE 10/06/2017 0254   PROTEINUR NEGATIVE 10/06/2017 0254   NITRITE NEGATIVE 10/06/2017 0254   LEUKOCYTESUR MODERATE (A) 10/06/2017 0254   Sepsis Labs: @LABRCNTIP (procalcitonin:4,lacticidven:4)  ) Recent Results (from the past 240 hour(s))  MRSA PCR Screening     Status: None   Collection Time: 10/06/17  3:28 AM  Result Value Ref Range Status   MRSA by PCR NEGATIVE NEGATIVE Final    Comment:        The GeneXpert MRSA Assay (FDA approved for NASAL specimens only), is one component of a comprehensive MRSA colonization surveillance program. It is not intended to diagnose MRSA infection nor to guide or monitor treatment for MRSA infections. Performed at [facility], [place] [address] [place], [place] [address]          Radiology Studies: Dg Chest 2 View  Result Date: 10/05/2017 CLINICAL DATA:  Acute onset of tachycardia with heart rate up to 200 that began earlier today. Shortness of breath with ambulation. Current history of diabetes and hypertension. EXAM: CHEST - 2  VIEW COMPARISON:  None. FINDINGS: External pacing pads are present. Cardiac silhouette normal in size. Thoracic aorta mildly atherosclerotic. Hilar and mediastinal contours otherwise unremarkable. Lungs clear. Bronchovascular markings normal. Pulmonary vascularity normal. No visible pleural effusions. No pneumothorax. Degenerative changes and DISH involving the thoracic spine. IMPRESSION: No acute cardiopulmonary disease. Electronically Signed   By: Evangeline Dakin M.D.   On: 10/05/2017 19:53   Nm Pulmonary Perf And Vent  Result Date: 10/06/2017 CLINICAL DATA:  Tachycardia and shortness of breath EXAM: NUCLEAR MEDICINE VENTILATION - PERFUSION LUNG SCAN TECHNIQUE: Ventilation images were obtained in multiple projections using inhaled aerosol  Tc-63m DTPA. Perfusion images were obtained in multiple projections after intravenous injection of Tc-35m-MAA. RADIOPHARMACEUTICALS:  31.9 mCi of Tc-52m DTPA aerosol inhalation and 4.28 mCi Tc77m-MAA IV COMPARISON:  Chest x-ray from 10/05/2017 FINDINGS: Ventilation: No focal ventilation defect. Perfusion: No wedge shaped peripheral perfusion defects to suggest acute pulmonary embolism. IMPRESSION: No evidence of pulmonary embolism. Electronically Signed   By: Inez Catalina M.D.   On: 10/06/2017 13:59        Scheduled Meds: . aspirin EC  81 mg Oral Daily  . atorvastatin  40 mg Oral q1800  . fesoterodine  8 mg Oral Daily  . gabapentin  300 mg Oral QHS  . levothyroxine  150 mcg Oral QAC breakfast  . magnesium oxide  400 mg Oral Daily  . metoprolol tartrate  12.5 mg Oral BID  . pantoprazole  40 mg Oral Daily   Continuous Infusions: . heparin 800 Units/hr (10/06/17 1450)     LOS: 1 day    Time spent: 25 minutes    Dana Allan, MD  [facility] Pager #: [phone] 7PM-7AM contact night coverage as above

## 2017-10-07 ENCOUNTER — Encounter (HOSPITAL_COMMUNITY)
Admission: EM | Disposition: A | Discharge status: Home Health | Payer: Self-pay | Source: Home / Self Care | Attending: Internal Medicine

## 2017-10-07 ENCOUNTER — Inpatient Hospital Stay (HOSPITAL_COMMUNITY): Payer: Medicare Other

## 2017-10-07 DIAGNOSIS — R778 Other specified abnormalities of plasma proteins: Secondary | ICD-10-CM

## 2017-10-07 DIAGNOSIS — I2510 Atherosclerotic heart disease of native coronary artery without angina pectoris: Secondary | ICD-10-CM

## 2017-10-07 DIAGNOSIS — R7989 Other specified abnormal findings of blood chemistry: Secondary | ICD-10-CM

## 2017-10-07 DIAGNOSIS — I471 Supraventricular tachycardia: Secondary | ICD-10-CM

## 2017-10-07 HISTORY — PX: LEFT HEART CATH AND CORONARY ANGIOGRAPHY: CATH118249

## 2017-10-07 LAB — GLUCOSE, CAPILLARY
Glucose-Capillary: 135 mg/dL — ABNORMAL HIGH (ref 70–99)
Glucose-Capillary: 139 mg/dL — ABNORMAL HIGH (ref 70–99)
Glucose-Capillary: 156 mg/dL — ABNORMAL HIGH (ref 70–99)
Glucose-Capillary: 97 mg/dL (ref 70–99)

## 2017-10-07 LAB — PROTIME-INR
INR: 1
Prothrombin Time: 13.1 seconds (ref 11.4–15.2)

## 2017-10-07 LAB — BASIC METABOLIC PANEL
Anion gap: 6 (ref 5–15)
BUN: 21 mg/dL (ref 8–23)
CO2: 25 mmol/L (ref 22–32)
Calcium: 9.2 mg/dL (ref 8.9–10.3)
Chloride: 109 mmol/L (ref 98–111)
Creatinine, Ser: 1.14 mg/dL — ABNORMAL HIGH (ref 0.44–1.00)
GFR calc Af Amer: 54 mL/min — ABNORMAL LOW (ref >60)
GFR calc non Af Amer: 46 mL/min — ABNORMAL LOW (ref >60)
Glucose, Bld: 150 mg/dL — ABNORMAL HIGH (ref 70–99)
Potassium: 3.9 mmol/L (ref 3.5–5.1)
Sodium: 140 mmol/L (ref 135–145)

## 2017-10-07 LAB — ECHOCARDIOGRAM COMPLETE
Height: 62 in
Weight: 2988.8 oz

## 2017-10-07 LAB — CBC WITH DIFFERENTIAL/PLATELET
Abs Immature Granulocytes: 0.1 10*3/uL (ref 0.0–0.1)
Basophils Absolute: 0.1 10*3/uL (ref 0.0–0.1)
Basophils Relative: 1 %
Eosinophils Absolute: 0.3 10*3/uL (ref 0.0–0.7)
Eosinophils Relative: 3 %
HCT: 30.9 % — ABNORMAL LOW (ref 36.0–46.0)
Hemoglobin: 9.5 g/dL — ABNORMAL LOW (ref 12.0–15.0)
Immature Granulocytes: 1 %
Lymphocytes Relative: 46 %
Lymphs Abs: 4.7 10*3/uL — ABNORMAL HIGH (ref 0.7–4.0)
MCH: 27.8 pg (ref 26.0–34.0)
MCHC: 30.7 g/dL (ref 30.0–36.0)
MCV: 90.4 fL (ref 78.0–100.0)
Monocytes Absolute: 0.7 10*3/uL (ref 0.1–1.0)
Monocytes Relative: 6 %
Neutro Abs: 4.3 10*3/uL (ref 1.7–7.7)
Neutrophils Relative %: 43 %
Platelets: 226 10*3/uL (ref 150–400)
RBC: 3.42 MIL/uL — ABNORMAL LOW (ref 3.87–5.11)
RDW: 16.3 % — ABNORMAL HIGH (ref 11.5–15.5)
WBC: 10.1 10*3/uL (ref 4.0–10.5)

## 2017-10-07 LAB — HEPARIN LEVEL (UNFRACTIONATED)
Heparin Unfractionated: 0.23 IU/mL — ABNORMAL LOW (ref 0.30–0.70)
Heparin Unfractionated: 0.45 IU/mL (ref 0.30–0.70)

## 2017-10-07 SURGERY — LEFT HEART CATH AND CORONARY ANGIOGRAPHY
Anesthesia: LOCAL

## 2017-10-07 MED ORDER — SODIUM CHLORIDE 0.9 % IV SOLN: 250.0000 mL | INTRAVENOUS | Status: DC | PRN

## 2017-10-07 MED ORDER — SODIUM CHLORIDE 0.9 % IV SOLN: INTRAVENOUS | Status: DC

## 2017-10-07 MED ORDER — IOPAMIDOL (ISOVUE-370) INJECTION 76%
INTRAVENOUS | Status: DC | PRN
  Administered 2017-10-07: 70 mL

## 2017-10-07 MED ORDER — DIAZEPAM 5 MG PO TABS: 5.0000 mg | ORAL_TABLET | Freq: Four times a day (QID) | ORAL | Status: DC | PRN

## 2017-10-07 MED ORDER — INSULIN ASPART 100 UNIT/ML ~~LOC~~ SOLN
0.0000 [IU] | Freq: Four times a day (QID) | SUBCUTANEOUS | Status: DC
  Administered 2017-10-07: 3 [IU] via SUBCUTANEOUS
  Administered 2017-10-07 (×2): 2 [IU] via SUBCUTANEOUS
  Administered 2017-10-08: 3 [IU] via SUBCUTANEOUS

## 2017-10-07 MED ORDER — SODIUM CHLORIDE 0.9 % IV SOLN
INTRAVENOUS | Status: DC
  Administered 2017-10-07: 23:00:00 via INTRAVENOUS

## 2017-10-07 MED ORDER — ASPIRIN 81 MG PO CHEW: 81.0000 mg | CHEWABLE_TABLET | Freq: Every day | ORAL | Status: DC

## 2017-10-07 MED ORDER — SODIUM CHLORIDE 0.9% FLUSH
3.0000 mL | Freq: Two times a day (BID) | INTRAVENOUS | Status: DC
  Administered 2017-10-07: 3 mL via INTRAVENOUS

## 2017-10-07 MED ORDER — HEPARIN SODIUM (PORCINE) 5000 UNIT/ML IJ SOLN
5000.0000 [IU] | Freq: Three times a day (TID) | INTRAMUSCULAR | Status: DC
  Administered 2017-10-08: 5000 [IU] via SUBCUTANEOUS
  Filled 2017-10-07: qty 1

## 2017-10-07 MED ORDER — ASPIRIN 81 MG PO CHEW: 81.0000 mg | CHEWABLE_TABLET | ORAL | Status: DC

## 2017-10-07 MED ORDER — SODIUM CHLORIDE 0.9% FLUSH: 3.0000 mL | INTRAVENOUS | Status: DC | PRN

## 2017-10-07 MED ORDER — HEPARIN SODIUM (PORCINE) 1000 UNIT/ML IJ SOLN
INTRAMUSCULAR | Status: DC | PRN
  Administered 2017-10-07: 4500 [IU] via INTRAVENOUS

## 2017-10-07 MED ORDER — FENTANYL CITRATE (PF) 100 MCG/2ML IJ SOLN
INTRAMUSCULAR | Status: AC
  Filled 2017-10-07: qty 2

## 2017-10-07 MED ORDER — MIDAZOLAM HCL 2 MG/2ML IJ SOLN
INTRAMUSCULAR | Status: DC | PRN
  Administered 2017-10-07: 1 mg via INTRAVENOUS

## 2017-10-07 MED ORDER — LIDOCAINE HCL (PF) 1 % IJ SOLN
INTRAMUSCULAR | Status: DC | PRN
  Administered 2017-10-07: 2 mL via INTRADERMAL

## 2017-10-07 MED ORDER — HEPARIN (PORCINE) IN NACL 1000-0.9 UT/500ML-% IV SOLN
INTRAVENOUS | Status: DC | PRN
  Administered 2017-10-07 (×2): 500 mL

## 2017-10-07 MED ORDER — MIDAZOLAM HCL 2 MG/2ML IJ SOLN
INTRAMUSCULAR | Status: AC
  Filled 2017-10-07: qty 2

## 2017-10-07 MED ORDER — SODIUM CHLORIDE 0.9 % IV SOLN
INTRAVENOUS | Status: DC
  Administered 2017-10-07: 12:00:00 via INTRAVENOUS

## 2017-10-07 MED ORDER — LIDOCAINE HCL (PF) 1 % IJ SOLN
INTRAMUSCULAR | Status: AC
  Filled 2017-10-07: qty 30

## 2017-10-07 MED ORDER — HEPARIN (PORCINE) IN NACL 1000-0.9 UT/500ML-% IV SOLN
INTRAVENOUS | Status: AC
  Filled 2017-10-07: qty 1000

## 2017-10-07 MED ORDER — VERAPAMIL HCL 2.5 MG/ML IV SOLN
INTRAVENOUS | Status: AC
  Filled 2017-10-07: qty 2

## 2017-10-07 MED ORDER — FENTANYL CITRATE (PF) 100 MCG/2ML IJ SOLN
INTRAMUSCULAR | Status: DC | PRN
  Administered 2017-10-07: 25 ug via INTRAVENOUS

## 2017-10-07 MED ORDER — VERAPAMIL HCL 2.5 MG/ML IV SOLN
INTRAVENOUS | Status: DC | PRN
  Administered 2017-10-07: 10 mL via INTRA_ARTERIAL

## 2017-10-07 MED ORDER — ACETAMINOPHEN 325 MG PO TABS: 650.0000 mg | ORAL_TABLET | ORAL | Status: DC | PRN

## 2017-10-07 MED ORDER — HEPARIN SODIUM (PORCINE) 1000 UNIT/ML IJ SOLN
INTRAMUSCULAR | Status: AC
  Filled 2017-10-07: qty 1

## 2017-10-07 MED ORDER — PERFLUTREN LIPID MICROSPHERE
1.0000 mL | INTRAVENOUS | Status: DC | PRN
  Administered 2017-10-07 (×2): 2 mL via INTRAVENOUS
  Filled 2017-10-07: qty 10

## 2017-10-07 MED ORDER — SODIUM CHLORIDE 0.9% FLUSH
3.0000 mL | Freq: Two times a day (BID) | INTRAVENOUS | Status: DC
  Administered 2017-10-07 – 2017-10-08 (×2): 3 mL via INTRAVENOUS

## 2017-10-07 MED ORDER — ONDANSETRON HCL 4 MG/2ML IJ SOLN: 4.0000 mg | Freq: Four times a day (QID) | INTRAMUSCULAR | Status: DC | PRN

## 2017-10-07 MED ORDER — SODIUM CHLORIDE 0.9% FLUSH: 3.0000 mL | Freq: Two times a day (BID) | INTRAVENOUS | Status: DC

## 2017-10-07 SURGICAL SUPPLY — 12 items

## 2017-10-07 NOTE — Progress Notes (Signed)
TEAM 1 - Stepdown/ICU TEAM  Kim Mills  YBO:[id number] DOB: 11/04/1940 DOA: 10/05/2017 PCP: Rosita Fire, MD    Brief Narrative:  77 y.o.femalewith a hx of osteoarthritis, cataracts, glaucoma, hypothyroidism, obesity, hyperlipidemia, hypertension, and DM2 who was found to have a heart rate of 200 bpm while undergoing physical therapy.  When the patient arrived at the ED her heart rate was recorded at 188 bpm. The patient reported severe dyspnea for a few weeks with minimal exertion, as well as LE edema.   Subjective: Resting comfortably in bed.  Denies current chest pressure nausea vomiting abdominal pain or shortness of breath.  Assessment & Plan:  SVT  W/u per Cardiology - has not recurred since admission - Cards planning on event monitor   Elevatedtroponin To have cardiac cath today   Hypothyroidism TSH 1.479 - continue levothyroxine   Acute renal impairment crt improving - hydrate and follow trend  Recent Labs  Lab 10/05/17 1845 10/07/17 0552  CREATININE 1.29* 1.14*    Hypertension BP controlled presently   Hyperlipidemia LDL markedly elevated at 192 - now on lipitor   GERD Cont PPI   Type 2 diabetes mellitus CBG well controlled   Normocytic anemia  Check anemia panel   +UA Currently asymptomatic - follow w/o tx for now   DVT prophylaxis: lovenox  Code Status: FULL CODE Family Communication: spoke with 2 daughters at bedside Disposition Plan: for cardiac cath today   Consultants:  Cardiology   Antimicrobials:  none   Objective: Blood pressure 104/72, pulse 68, temperature 97.9 F (36.6 C), temperature source Oral, resp. rate (!) 8, height 5\' 2"  (1.575 m), weight 84.7 kg, SpO2 98 %.  Intake/Output Summary (Last 24 hours) at 10/07/2017 1606 Last data filed at 10/07/2017 1400 Gross per 24 hour  Intake 917.19 ml  Output 1400 ml  Net -482.81 ml   Filed Weights   10/05/17 1832 10/06/17 0221 10/07/17 0046  Weight: 88  kg 88.2 kg 84.7 kg    Examination: General: No acute respiratory distress Lungs: Clear to auscultation bilaterally without wheezes or crackles Cardiovascular: Regular rate and rhythm without murmur gallop or rub normal S1 and S2 Abdomen: Nontender, nondistended, soft, bowel sounds positive, no rebound, no ascites, no appreciable mass Extremities: No significant cyanosis, clubbing, or edema bilateral lower extremities  CBC: Recent Labs  Lab 10/05/17 1845 10/06/17 0343 10/07/17 0322  WBC 13.6* 11.1* 10.1  NEUTROABS 6.1  --  4.3  HGB 11.3* 9.3* 9.5*  HCT 35.1* 29.7* 30.9*  MCV 89.5 91.7 90.4  PLT 281 215 277   Basic Metabolic Panel: Recent Labs  Lab 10/05/17 1845 10/07/17 0552  NA 139 140  K 4.0 3.9  CL 106 109  CO2 24 25  GLUCOSE 155* 150*  BUN 27* 21  CREATININE 1.29* 1.14*  CALCIUM 9.2 9.2  MG 1.8  --    GFR: Estimated Creatinine Clearance: 43.7 mL/min (A) (by C-G formula based on SCr of 1.14 mg/dL (H)).  Liver Function Tests: Recent Labs  Lab 10/05/17 1845  AST 26  ALT 20  ALKPHOS 67  BILITOT 0.5  PROT 7.8  ALBUMIN 4.1    Coagulation Profile: Recent Labs  Lab 10/07/17 1203  INR 1.00    Cardiac Enzymes: Recent Labs  Lab 10/05/17 1845 10/05/17 2209 10/06/17 0944  TROPONINI 0.77* 1.25* 0.84*    CBG: Recent Labs  Lab 10/06/17 0234 10/06/17 0617 10/07/17 0036 10/07/17 0547 10/07/17 1131  GLUCAP 158* 144* 156* 135* 139*  Recent Results (from the past 240 hour(s))  MRSA PCR Screening     Status: None   Collection Time: 10/06/17  3:28 AM  Result Value Ref Range Status   MRSA by PCR NEGATIVE NEGATIVE Final    Comment:        The GeneXpert MRSA Assay (FDA approved for NASAL specimens only), is one component of a comprehensive MRSA colonization surveillance program. It is not intended to diagnose MRSA infection nor to guide or monitor treatment for MRSA infections. Performed at [facility], [place] [address], [place],  [place] [address]      Scheduled Meds: . [START ON 10/08/2017] aspirin  81 mg Oral Pre-Cath  . aspirin EC  81 mg Oral Daily  . atorvastatin  40 mg Oral q1800  . fesoterodine  8 mg Oral Daily  . gabapentin  300 mg Oral QHS  . insulin aspart  0-15 Units Subcutaneous Q6H  . levothyroxine  150 mcg Oral QAC breakfast  . magnesium oxide  400 mg Oral Daily  . metoprolol tartrate  12.5 mg Oral BID  . pantoprazole  40 mg Oral Daily  . sodium chloride flush  3 mL Intravenous Q12H  . sodium chloride flush  3 mL Intravenous Q12H     LOS: 2 days   Cherene Altes, MD [facility] Office  [phone] Pager - Text Page per Shea Evans  If 7PM-7AM, please contact night-coverage per Amion 10/07/2017, 4:06 PM

## 2017-10-07 NOTE — Progress Notes (Signed)
*  PRELIMINARY RESULTS* Echocardiogram 2D Echocardiogram has been performed with Definity.  Kim Mills 10/07/2017, 2:24 PM

## 2017-10-07 NOTE — Progress Notes (Signed)
ANTICOAGULATION CONSULT NOTE   Pharmacy Consult for Heparin Indication: chest pain/ACS  No Known Allergies  Patient Measurements: Height: 5\' 2"  (157.5 cm) Weight: 186 lb 12.8 oz (84.7 kg)(Scale B) IBW/kg (Calculated) : 50.1 Heparin Dosing Weight: 71 kg   Vital Signs: Temp: 98.7 F (37.1 C) (08/30 0312) Temp Source: Oral (08/30 0312) BP: 107/41 (08/30 0312) Pulse Rate: 58 (08/30 0400)  Labs: Recent Labs    10/05/17 1845 10/05/17 2209 10/06/17 0343 10/06/17 0944 10/06/17 1213 10/06/17 2129 10/07/17 0322  HGB 11.3*  --  9.3*  --   --   --  9.5*  HCT 35.1*  --  29.7*  --   --   --  30.9*  PLT 281  --  215  --   --   --  226  HEPARINUNFRC  --   --   --   --  0.96* 0.15* 0.23*  CREATININE 1.29*  --   --   --   --   --   --   TROPONINI 0.77* 1.25*  --  0.84*  --   --   --     Estimated Creatinine Clearance: 38.6 mL/min (A) (by C-G formula based on SCr of 1.29 mg/dL (H)).   Medical History: Past Medical History:  Diagnosis Date  . Arthritis   . Cataract   . Chest pain    Associated with weakness and fatigue  . Diabetes mellitus    A1c of 7.4 in 08/2010  . Glaucoma   . Hyperlipidemia    Lipid profile in 08/2010:190, 121, 48, 118; normal CBC and CMet  . Hypertension    Lipid profile in 08/2010:190, 121, 48, 118; normal CBC and CMet  . Hypothyroidism   . Obesity     Medications:  Medications Prior to Admission  Medication Sig Dispense Refill Last Dose  . gabapentin (NEURONTIN) 300 MG capsule Take 300 mg by mouth at bedtime.    10/04/2017 at Unknown time  . Garlic (GARLIQUE) 093 MG TBEC Take 1 tablet by mouth every evening.   Past Week at Unknown time  . glipiZIDE (GLUCOTROL) 5 MG tablet Take 1 tablet by mouth 2 (two) times daily.   10/05/2017 at Unknown time  . losartan-hydrochlorothiazide (HYZAAR) 100-12.5 MG tablet Take 1 tablet by mouth daily.   10/05/2017 at Unknown time  . meclizine (ANTIVERT) 25 MG tablet Take 25 mg by mouth 3 (three) times daily as needed for  dizziness.   unknown  . metFORMIN (GLUCOPHAGE) 500 MG tablet Take 1,000 mg by mouth 2 (two) times daily.    10/05/2017 at Unknown time  . naproxen (NAPROSYN) 500 MG tablet Take 500 mg by mouth daily as needed for mild pain.    Past Week at Unknown time  . Omega-3 Fatty Acids (OMEGA-3 FISH OIL PO) Take 1 capsule by mouth every evening.   Past Week at Unknown time  . omeprazole (PRILOSEC) 40 MG capsule Take 1 capsule (40 mg total) by mouth daily. 90 capsule 3 10/05/2017 at Unknown time  . SYNTHROID 150 MCG tablet Take 1 tablet by mouth daily.   10/05/2017 at Unknown time  . tolterodine (DETROL LA) 4 MG 24 hr capsule Take 4 mg by mouth daily.   10/05/2017 at Unknown time  . UNKNOWN TO PATIENT Take 1 tablet by mouth daily. Blood Pressure   10/05/2017 at Unknown time    Assessment: 49 YOF who presented as a transfer from [facility] with increasing dyspnea and exertion with SVT and heart rate  in the 200s. Troponins are elevated. Pharmacy consulted to start IV heparin. CBC reviewed. She was not on anticoagulation at home.   8/30 AM update: heparin level low despite rate increase, VQ scan is negative for PE, Hgb stable from yesterday   Goal of Therapy:  Heparin level 0.3-0.7 units/ml Monitor platelets by anticoagulation protocol: Yes   Plan:  Inc heparin to 1050 units/hr 1300 HL Possible cath today  Narda Bonds, PharmD, [place] Pharmacist Phone: [phone]

## 2017-10-07 NOTE — Progress Notes (Signed)
Progress Note  Patient Name: Kim Mills Date of Encounter: 10/07/2017  Primary Cardiologist: =[place] 2012    Subjective   Breathing is OK at rest   NO CP    Inpatient Medications    Scheduled Meds: . aspirin EC  81 mg Oral Daily  . atorvastatin  40 mg Oral q1800  . fesoterodine  8 mg Oral Daily  . gabapentin  300 mg Oral QHS  . insulin aspart  0-15 Units Subcutaneous Q6H  . levothyroxine  150 mcg Oral QAC breakfast  . magnesium oxide  400 mg Oral Daily  . metoprolol tartrate  12.5 mg Oral BID  . pantoprazole  40 mg Oral Daily   Continuous Infusions: . heparin 1,050 Units/hr (10/07/17 0413)   PRN Meds: acetaminophen **OR** acetaminophen, meclizine, ondansetron **OR** ondansetron (ZOFRAN) IV   Vital Signs    Vitals:   10/06/17 2337 10/07/17 0046 10/07/17 0312 10/07/17 0400  BP: (!) 142/54  (!) 107/41   Pulse:   63 (!) 58  Resp:   18 16  Temp: 98.8 F (37.1 C)  98.7 F (37.1 C)   TempSrc: Oral  Oral   SpO2:    98%  Weight:  84.7 kg    Height:        Intake/Output Summary (Last 24 hours) at 10/07/2017 0847 Last data filed at 10/07/2017 0400 Gross per 24 hour  Intake 714 ml  Output 1000 ml  Net -286 ml   Filed Weights   10/05/17 1832 10/06/17 0221 10/07/17 0046  Weight: 88 kg 88.2 kg 84.7 kg    Telemetry  SR   - Personally Reviewed  ECG      Physical Exam   GEN:  Obese 77 yo  acute distress.   Neck: JVP is normal   Cardiac: RRR, no murmurs, rubs, or gallops.  Respiratory: Clear to auscultation bilaterally. GI: Soft, nontender, non-distended  MS: No edema; No deformity. Neuro:  Nonfocal  Psych: Normal affect   Labs    Chemistry Recent Labs  Lab 10/05/17 1845 10/07/17 0552  NA 139 140  K 4.0 3.9  CL 106 109  CO2 24 25  GLUCOSE 155* 150*  BUN 27* 21  CREATININE 1.29* 1.14*  CALCIUM 9.2 9.2  PROT 7.8  --   ALBUMIN 4.1  --   AST 26  --   ALT 20  --   ALKPHOS 67  --   BILITOT 0.5  --   GFRNONAA 40* 46*  GFRAA 46* 54*    ANIONGAP 9 6     Hematology Recent Labs  Lab 10/05/17 1845 10/06/17 0343 10/07/17 0322  WBC 13.6* 11.1* 10.1  RBC 3.92 3.24* 3.42*  HGB 11.3* 9.3* 9.5*  HCT 35.1* 29.7* 30.9*  MCV 89.5 91.7 90.4  MCH 28.8 28.7 27.8  MCHC 32.2 31.3 30.7  RDW 16.5* 16.2* 16.3*  PLT 281 215 226    Cardiac Enzymes Recent Labs  Lab 10/05/17 1845 10/05/17 2209 10/06/17 0944  TROPONINI 0.77* 1.25* 0.84*   No results for input(s): TROPIPOC in the last 168 hours.   BNP Recent Labs  Lab 10/05/17 1845  BNP 138.0*     DDimer  Recent Labs  Lab 10/05/17 1845  DDIMER 0.51*     Radiology    Dg Chest 2 View  Result Date: 10/05/2017 CLINICAL DATA:  Acute onset of tachycardia with heart rate up to 200 that began earlier today. Shortness of breath with ambulation. Current history of diabetes and hypertension. EXAM:  CHEST - 2 VIEW COMPARISON:  None. FINDINGS: External pacing pads are present. Cardiac silhouette normal in size. Thoracic aorta mildly atherosclerotic. Hilar and mediastinal contours otherwise unremarkable. Lungs clear. Bronchovascular markings normal. Pulmonary vascularity normal. No visible pleural effusions. No pneumothorax. Degenerative changes and DISH involving the thoracic spine. IMPRESSION: No acute cardiopulmonary disease. Electronically Signed   By: Evangeline Dakin M.D.   On: 10/05/2017 19:53   Nm Pulmonary Perf And Vent  Result Date: 10/06/2017 CLINICAL DATA:  Tachycardia and shortness of breath EXAM: NUCLEAR MEDICINE VENTILATION - PERFUSION LUNG SCAN TECHNIQUE: Ventilation images were obtained in multiple projections using inhaled aerosol Tc-29m DTPA. Perfusion images were obtained in multiple projections after intravenous injection of Tc-64m-MAA. RADIOPHARMACEUTICALS:  31.9 mCi of Tc-71m DTPA aerosol inhalation and 4.28 mCi Tc24m-MAA IV COMPARISON:  Chest x-ray from 10/05/2017 FINDINGS: Ventilation: No focal ventilation defect. Perfusion: No wedge shaped peripheral perfusion  defects to suggest acute pulmonary embolism. IMPRESSION: No evidence of pulmonary embolism. Electronically Signed   By: Inez Catalina M.D.   On: 10/06/2017 13:59    Cardiac Studies     Patient Profile     77 y.o. female with history of CP in past  , HL   Presented with SVT   Converted with adenosine IV   Assessment & Plan    1  SVT  No recurrence in hosp   Pt did nto really sense SVT    Would probably send home with event moitor to documen for recurrence  2  Elev troponin  WOrrisome  Pt with multiple cardiac risk factors   (HTN, DM, HL)    Trop elev with high heart rates   Pt is SOB with minor acitvity   I would recomm L heart cath to define anatomy   Pt understands risks/benefits   Agrees to proceed.  3   HL   Profound elevation of LDL   Back on Lipitor    For questions or updates, please contact [facility] Please consult [email or web address] for contact info under Cardiology/STEMI.      Signed, Dorris Carnes, MD  10/07/2017, 8:47 AM

## 2017-10-07 NOTE — Plan of Care (Signed)
  Problem: Clinical Measurements: Goal: Ability to maintain clinical measurements within normal limits will improve Outcome: Progressing Goal: Respiratory complications will improve Outcome: Progressing   Problem: Nutrition: Goal: Adequate nutrition will be maintained Outcome: Progressing   Problem: Pain Managment: Goal: General experience of comfort will improve Outcome: Progressing

## 2017-10-07 NOTE — Progress Notes (Signed)
ANTICOAGULATION CONSULT NOTE   Pharmacy Consult for Heparin Indication: chest pain/ACS  No Known Allergies  Patient Measurements: Height: 5\' 2"  (157.5 cm) Weight: 186 lb 12.8 oz (84.7 kg)(Scale B) IBW/kg (Calculated) : 50.1 Heparin Dosing Weight: 71 kg   Vital Signs: Temp: 97.9 F (36.6 C) (08/30 1135) Temp Source: Oral (08/30 1135) BP: 137/62 (08/30 1135) Pulse Rate: 63 (08/30 1135)  Labs: Recent Labs    10/05/17 1845 10/05/17 2209 10/06/17 0343 10/06/17 0944  10/06/17 2129 10/07/17 0322 10/07/17 0552 10/07/17 1203  HGB 11.3*  --  9.3*  --   --   --  9.5*  --   --   HCT 35.1*  --  29.7*  --   --   --  30.9*  --   --   PLT 281  --  215  --   --   --  226  --   --   LABPROT  --   --   --   --   --   --   --   --  13.1  INR  --   --   --   --   --   --   --   --  1.00  HEPARINUNFRC  --   --   --   --    < > 0.15* 0.23*  --  0.45  CREATININE 1.29*  --   --   --   --   --   --  1.14*  --   TROPONINI 0.77* 1.25*  --  0.84*  --   --   --   --   --    < > = values in this interval not displayed.    Estimated Creatinine Clearance: 43.7 mL/min (A) (by C-G formula based on SCr of 1.14 mg/dL (H)).   Medical History: Past Medical History:  Diagnosis Date  . Arthritis   . Cataract   . Chest pain    Associated with weakness and fatigue  . Diabetes mellitus    A1c of 7.4 in 08/2010  . Glaucoma   . Hyperlipidemia    Lipid profile in 08/2010:190, 121, 48, 118; normal CBC and CMet  . Hypertension    Lipid profile in 08/2010:190, 121, 48, 118; normal CBC and CMet  . Hypothyroidism   . Obesity     Medications:  Medications Prior to Admission  Medication Sig Dispense Refill Last Dose  . gabapentin (NEURONTIN) 300 MG capsule Take 300 mg by mouth at bedtime.    10/04/2017 at Unknown time  . Garlic (GARLIQUE) 932 MG TBEC Take 1 tablet by mouth every evening.   Past Week at Unknown time  . glipiZIDE (GLUCOTROL) 5 MG tablet Take 1 tablet by mouth 2 (two) times daily.   10/05/2017  at Unknown time  . losartan-hydrochlorothiazide (HYZAAR) 100-12.5 MG tablet Take 1 tablet by mouth daily.   10/05/2017 at Unknown time  . meclizine (ANTIVERT) 25 MG tablet Take 25 mg by mouth 3 (three) times daily as needed for dizziness.   unknown  . metFORMIN (GLUCOPHAGE) 500 MG tablet Take 1,000 mg by mouth 2 (two) times daily.    10/05/2017 at Unknown time  . naproxen (NAPROSYN) 500 MG tablet Take 500 mg by mouth daily as needed for mild pain.    Past Week at Unknown time  . Omega-3 Fatty Acids (OMEGA-3 FISH OIL PO) Take 1 capsule by mouth every evening.   Past Week at Unknown time  .  omeprazole (PRILOSEC) 40 MG capsule Take 1 capsule (40 mg total) by mouth daily. 90 capsule 3 10/05/2017 at Unknown time  . SYNTHROID 150 MCG tablet Take 1 tablet by mouth daily.   10/05/2017 at Unknown time  . tolterodine (DETROL LA) 4 MG 24 hr capsule Take 4 mg by mouth daily.   10/05/2017 at Unknown time  . UNKNOWN TO PATIENT Take 1 tablet by mouth daily. Blood Pressure   10/05/2017 at Unknown time    Assessment: Kim Mills who presented as a transfer from [facility] with increasing dyspnea and exertion with SVT and heart rate in the 200s. Troponins are elevated. Pharmacy consulted to start IV heparin.   Heparin level therapeutic after rate adjustment this morning, Hgb low but stable. Cardiac cath planned for this afternoon tentatively.  Goal of Therapy:  Heparin level 0.3-0.7 units/ml Monitor platelets by anticoagulation protocol: Yes   Plan:  Continue IV heparin 1050 units/hr Daily heparin level Follow-up plans for cath  Arrie Senate, PharmD, BCPS Clinical Pharmacist [phone] Please check AMION for all [facility] numbers 10/07/2017

## 2017-10-08 LAB — VITAMIN B12: Vitamin B-12: 102 pg/mL — ABNORMAL LOW (ref 180–914)

## 2017-10-08 LAB — CBC
HCT: 31.5 % — ABNORMAL LOW (ref 36.0–46.0)
Hemoglobin: 10 g/dL — ABNORMAL LOW (ref 12.0–15.0)
MCH: 29 pg (ref 26.0–34.0)
MCHC: 31.7 g/dL (ref 30.0–36.0)
MCV: 91.3 fL (ref 78.0–100.0)
Platelets: 218 10*3/uL (ref 150–400)
RBC: 3.45 MIL/uL — ABNORMAL LOW (ref 3.87–5.11)
RDW: 16.4 % — ABNORMAL HIGH (ref 11.5–15.5)
WBC: 9.4 10*3/uL (ref 4.0–10.5)

## 2017-10-08 LAB — IRON AND TIBC
Iron: 59 ug/dL (ref 28–170)
Saturation Ratios: 21 % (ref 10.4–31.8)
TIBC: 280 ug/dL (ref 250–450)
UIBC: 221 ug/dL

## 2017-10-08 LAB — COMPREHENSIVE METABOLIC PANEL
ALT: 29 U/L (ref 0–44)
AST: 35 U/L (ref 15–41)
Albumin: 3.1 g/dL — ABNORMAL LOW (ref 3.5–5.0)
Alkaline Phosphatase: 53 U/L (ref 38–126)
Anion gap: 8 (ref 5–15)
BUN: 16 mg/dL (ref 8–23)
CO2: 23 mmol/L (ref 22–32)
Calcium: 8.9 mg/dL (ref 8.9–10.3)
Chloride: 110 mmol/L (ref 98–111)
Creatinine, Ser: 1.03 mg/dL — ABNORMAL HIGH (ref 0.44–1.00)
GFR calc Af Amer: >60 mL/min (ref >60)
GFR calc non Af Amer: 52 mL/min — ABNORMAL LOW (ref >60)
Glucose, Bld: 90 mg/dL (ref 70–99)
Potassium: 3.8 mmol/L (ref 3.5–5.1)
Sodium: 141 mmol/L (ref 135–145)
Total Bilirubin: 0.6 mg/dL (ref 0.3–1.2)
Total Protein: 6.1 g/dL — ABNORMAL LOW (ref 6.5–8.1)

## 2017-10-08 LAB — GLUCOSE, CAPILLARY
Glucose-Capillary: 154 mg/dL — ABNORMAL HIGH (ref 70–99)
Glucose-Capillary: 106 mg/dL — ABNORMAL HIGH (ref 70–99)

## 2017-10-08 LAB — FERRITIN: Ferritin: 85 ng/mL (ref 11–307)

## 2017-10-08 LAB — FOLATE: Folate: 11.8 ng/mL (ref >5.9)

## 2017-10-08 LAB — RETICULOCYTES
RBC.: 3.45 MIL/uL — ABNORMAL LOW (ref 3.87–5.11)
Retic Count, Absolute: 48.3 10*3/uL (ref 19.0–186.0)
Retic Ct Pct: 1.4 % (ref 0.4–3.1)

## 2017-10-08 MED ORDER — METFORMIN HCL 500 MG PO TABS: 1000.0000 mg | ORAL_TABLET | Freq: Two times a day (BID) | ORAL | Status: DC

## 2017-10-08 MED ORDER — METOPROLOL TARTRATE 25 MG PO TABS: 12.5000 mg | ORAL_TABLET | Freq: Two times a day (BID) | ORAL | 0 refills | Status: DC

## 2017-10-08 MED ORDER — ATORVASTATIN CALCIUM 40 MG PO TABS: 40.0000 mg | ORAL_TABLET | Freq: Every day | ORAL | 0 refills | Status: DC

## 2017-10-08 MED ORDER — VITAMIN B-12 1000 MCG PO TABS: 1000.0000 ug | ORAL_TABLET | Freq: Every day | ORAL | 0 refills | Status: DC

## 2017-10-08 MED ORDER — ASPIRIN 81 MG PO TBEC: 81.0000 mg | DELAYED_RELEASE_TABLET | Freq: Every day | ORAL | 0 refills | Status: DC

## 2017-10-08 NOTE — Discharge Summary (Signed)
Kim Mills WUX:[id number] DOB: March 21, 1940 DOA: 10/05/2017  PCP: Rosita Fire, MD  Admit date: 10/05/2017  Discharge date: 10/08/2017  Admitted From: Home  Disposition:  Home   Recommendations for Outpatient Follow-up:   Follow up with PCP in 1-2 weeks  PCP Please obtain BMP/CBC, 2 view CXR in 1week,  (see Discharge instructions)   PCP Please follow up on the following pending results: None   Home Health: PT,RN   Equipment/Devices: Rolling walker with a seat Consultations: Cardiology Discharge Condition: Stable   CODE STATUS: Full    Diet Recommendation: Heart Healthy Low Carb   Chief Complaint  Patient presents with  . Tachycardia     Brief history of present illness from the day of admission and additional interim summary    77 y.o.femalewitha hx of osteoarthritis, cataracts, glaucoma, hypothyroidism, obesity, hyperlipidemia, hypertension, and DM2 who was found to have a heart rate of 200 bpm while undergoing physical therapy. When the patient arrived at the ED her heart rate was recorded at 188 bpm. The patient reported severe dyspneafor a few weeks with minimal exertion, as well as LE edema.                                                                  Hospital Course    SVT  mild troponin rise due to rate related demand mismatch- seen by cardiology work-up done, she remained in sinus for most of her stay here, echocardiogram and TSH were unremarkable, left heart cath was stable, she did have a mild elevation in troponin which peaked around 1.5, remained symptom-free, she has been placed on aspirin and statin along with low-dose beta-blocker looking at her resting heart rate which is in low 50s to 60s.  She will be discharged home with outpatient follow-up with cardiology and with PCP.   Cardiology recommends event monitor after discharge.   Hypothyroidism  TSH 1.479 - continue levothyroxine   Acute renal impairment  Resolved after hydration, is on HCTZ request PCP to monitor.   Hypertension  BP controlled presently   Hyperlipidemia  LDL markedly elevated at 192 - now on lipitor   GERD  Cont PPI   Type 2 diabetes mellitus  CBG well controlled on home regimen upon discharge start Glucophage on September 2 due to recent dye exposure.  Requested to check CBGs QA CHS and present the results to PCP in a week next visit.  Normocytic anemia -  PCP to do outpatient age-appropriate work-up.  +UA  Asymptomatic  Mild chronic generalized weakness.  PT ordered, PCP to do outpatient age-appropriate work-up for chronic weakness.  Vernard Gambles if newly found low vitamin B12 could be responsible.  Low Vitamin B12.  Placed on oral supplementation.  PCP to monitor B12 levels.    Discharge diagnosis  Principal Problem:   SVT (supraventricular tachycardia) (HCC) Active Problems:   Type 2 diabetes mellitus (HCC)   Hypothyroidism   Hypertension   Hyperlipidemia   Constipation   GERD (gastroesophageal reflux disease)   Elevated troponin    Discharge instructions    Discharge Instructions    Discharge instructions   Complete by:  As directed    Follow with Primary MD Rosita Fire, MD in 7 days , resume Metformin/Glucophage on September 2.  Get CBC, CMP, checked  by Primary MD  in 5-7 days    Activity: As tolerated with Full fall precautions use walker/cane & assistance as needed  Disposition Home   Diet:  Heart Healthy  Low Carb  Do Accu-Cheks and monitor CBGs QA CHS, no other results to PCP in a week.  For Heart failure patients - Check your Weight same time everyday, if you gain over 2 pounds, or you develop in leg swelling, experience more shortness of breath or chest pain, call your Primary MD immediately. Follow Cardiac Low Salt Diet and 1.5 lit/day fluid  restriction.  Special Instructions: If you have smoked or chewed Tobacco  in the last 2 yrs please stop smoking, stop any regular Alcohol  and or any Recreational drug use.  On your next visit with your primary care physician please Get Medicines reviewed and adjusted.  Please request your Prim.MD to go over all Hospital Tests and Procedure/Radiological results at the follow up, please get all Hospital records sent to your Prim MD by signing hospital release before you go home.  If you experience worsening of your admission symptoms, develop shortness of breath, life threatening emergency, suicidal or homicidal thoughts you must seek medical attention immediately by calling 911 or calling your MD immediately  if symptoms less severe.  You Must read complete instructions/literature along with all the possible adverse reactions/side effects for all the Medicines you take and that have been prescribed to you. Take any new Medicines after you have completely understood and accpet all the possible adverse reactions/side effects.   Increase activity slowly   Complete by:  As directed       Discharge Medications   Allergies as of 10/08/2017   No Known Allergies     Medication List    STOP taking these medications   naproxen 500 MG tablet Commonly known as:  NAPROSYN     TAKE these medications   aspirin 81 MG EC tablet Take 1 tablet (81 mg total) by mouth daily. Start taking on:  10/09/2017   atorvastatin 40 MG tablet Commonly known as:  LIPITOR Take 1 tablet (40 mg total) by mouth daily at 6 PM.   gabapentin 300 MG capsule Commonly known as:  NEURONTIN Take 300 mg by mouth at bedtime.   GARLIQUE 400 MG Tbec Generic drug:  Garlic Take 1 tablet by mouth every evening.   glipiZIDE 5 MG tablet Commonly known as:  GLUCOTROL Take 1 tablet by mouth 2 (two) times daily.   losartan-hydrochlorothiazide 100-12.5 MG tablet Commonly known as:  HYZAAR Take 1 tablet by mouth daily.     meclizine 25 MG tablet Commonly known as:  ANTIVERT Take 25 mg by mouth 3 (three) times daily as needed for dizziness.   metFORMIN 500 MG tablet Commonly known as:  GLUCOPHAGE Take 2 tablets (1,000 mg total) by mouth 2 (two) times daily. Start taking on:  10/10/2017 What changed:  These instructions start on 10/10/2017. If you are unsure what to do until then, ask  your doctor or other care provider.   metoprolol tartrate 25 MG tablet Commonly known as:  LOPRESSOR Take 0.5 tablets (12.5 mg total) by mouth 2 (two) times daily.   OMEGA-3 FISH OIL PO Take 1 capsule by mouth every evening.   omeprazole 40 MG capsule Commonly known as:  PRILOSEC Take 1 capsule (40 mg total) by mouth daily.   SYNTHROID 150 MCG tablet Generic drug:  levothyroxine Take 1 tablet by mouth daily.   tolterodine 4 MG 24 hr capsule Commonly known as:  DETROL LA Take 4 mg by mouth daily.   UNKNOWN TO PATIENT Take 1 tablet by mouth daily. Blood Pressure   vitamin B-12 1000 MCG tablet Commonly known as:  CYANOCOBALAMIN Take 1 tablet (1,000 mcg total) by mouth daily.            Durable Medical Equipment  (From admission, onward)         Start     Ordered   10/08/17 1148  For home use only DME 4 wheeled rolling walker with seat  (Walkers)  Once    Question:  Patient needs a walker to treat with the following condition  Answer:  Weakness   10/08/17 1148          Follow-up Information    Rosita Fire, MD. Schedule an appointment as soon as possible for a visit in 1 week(s).   Specialty:  Internal Medicine Contact information: [place] [address] [phone]        Herminio Commons, MD. Schedule an appointment as soon as possible for a visit in 1 week(s).   Specialty:  Cardiology Why:  SVT, event monitor Contact information: [place] [address] [phone]           Major procedures and Radiology Reports - PLEASE review  detailed and final reports thoroughly  -      Echo -  Echo contrast used to better define wall motion. EF normal, no significant wall motion abnormalities.   L.Heart Cath   Prox RCA lesion is 20% stenosed.  Prox LAD lesion is 40% stenosed.  Prox Cx lesion is 20% stenosed.  Mid Cx lesion is 30% stenosed.  The left ventricular ejection fraction is 55-65% by visual estimate.  The left ventricular systolic function is normal.  LV end diastolic pressure is normal.   There is mild coronary calcification and mild coronary artery disease with 40% proximal focal stenosis in the LAD immediately prior to the first diagonal vessel, 20 and 30% circumflex stenoses and 20% proximal RCA stenosis in a nondominant RCA.  LV function without wall motion abnormalities.  Ejection fraction 55 to 65%.  RECOMMENDATION: Medical therapy for CAD and SVT.   High potency statin therapy in an attempt to induce plaque regression.  Optimal management of blood pressure and diabetes.  Weight loss.  Recommend Aspirin 81mg  daily for moderate CAD.    Dg Chest 2 View  Result Date: 10/05/2017 CLINICAL DATA:  Acute onset of tachycardia with heart rate up to 200 that began earlier today. Shortness of breath with ambulation. Current history of diabetes and hypertension. EXAM: CHEST - 2 VIEW COMPARISON:  None. FINDINGS: External pacing pads are present. Cardiac silhouette normal in size. Thoracic aorta mildly atherosclerotic. Hilar and mediastinal contours otherwise unremarkable. Lungs clear. Bronchovascular markings normal. Pulmonary vascularity normal. No visible pleural effusions. No pneumothorax. Degenerative changes and DISH involving the thoracic spine. IMPRESSION: No acute cardiopulmonary disease. Electronically Signed  By: Evangeline Dakin M.D.   On: 10/05/2017 19:53   Nm Pulmonary Perf And Vent  Result Date: 10/06/2017 CLINICAL DATA:  Tachycardia and shortness of breath EXAM: NUCLEAR MEDICINE VENTILATION -  PERFUSION LUNG SCAN TECHNIQUE: Ventilation images were obtained in multiple projections using inhaled aerosol Tc-75m DTPA. Perfusion images were obtained in multiple projections after intravenous injection of Tc-30m-MAA. RADIOPHARMACEUTICALS:  31.9 mCi of Tc-22m DTPA aerosol inhalation and 4.28 mCi Tc46m-MAA IV COMPARISON:  Chest x-ray from 10/05/2017 FINDINGS: Ventilation: No focal ventilation defect. Perfusion: No wedge shaped peripheral perfusion defects to suggest acute pulmonary embolism. IMPRESSION: No evidence of pulmonary embolism. Electronically Signed   By: Inez Catalina M.D.   On: 10/06/2017 13:59    Micro Results   Recent Results (from the past 240 hour(s))  MRSA PCR Screening     Status: None   Collection Time: 10/06/17  3:28 AM  Result Value Ref Range Status   MRSA by PCR NEGATIVE NEGATIVE Final    Comment:        The GeneXpert MRSA Assay (FDA approved for NASAL specimens only), is one component of a comprehensive MRSA colonization surveillance program. It is not intended to diagnose MRSA infection nor to guide or monitor treatment for MRSA infections. Performed at [facility], [place] [address], [place], [place] [address]     Today   Subjective    Zlaty Alexa today has no headache,no chest abdominal pain,no new weakness tingling or numbness, feels much better wants to go home today.    Objective   Blood pressure 140/63, pulse 61, temperature 97.8 F (36.6 C), temperature source Oral, resp. rate 14, height 5\' 2"  (1.575 m), weight 86.2 kg, SpO2 98 %.   Intake/Output Summary (Last 24 hours) at 10/08/2017 1158 Last data filed at 10/08/2017 1139 Gross per 24 hour  Intake 2204.01 ml  Output 1825 ml  Net 379.01 ml    Exam Awake Alert, Oriented x 3, No new F.N deficits, Normal affect [place].AT,PERRAL Supple Neck,No JVD, No cervical lymphadenopathy appriciated.  Symmetrical Chest wall movement, Good air movement bilaterally, CTAB RRR,No Gallops,Rubs or new  Murmurs, No Parasternal Heave +ve B.Sounds, Abd Soft, Non tender, No organomegaly appriciated, No rebound -guarding or rigidity. No Cyanosis, Clubbing or edema, No new Rash or bruise   Data Review   CBC w Diff:  Lab Results  Component Value Date   WBC 9.4 10/08/2017   HGB 10.0 (L) 10/08/2017   HCT 31.5 (L) 10/08/2017   PLT 218 10/08/2017   LYMPHOPCT 46 10/07/2017   MONOPCT 6 10/07/2017   EOSPCT 3 10/07/2017   BASOPCT 1 10/07/2017    CMP:  Lab Results  Component Value Date   NA 141 10/08/2017   K 3.8 10/08/2017   CL 110 10/08/2017   CO2 23 10/08/2017   BUN 16 10/08/2017   CREATININE 1.03 (H) 10/08/2017   PROT 6.1 (L) 10/08/2017   ALBUMIN 3.1 (L) 10/08/2017   BILITOT 0.6 10/08/2017   ALKPHOS 53 10/08/2017   AST 35 10/08/2017   ALT 29 10/08/2017  . No results found for: HGBA1C Lab Results  Component Value Date   CHOL 264 (H) 10/06/2017   HDL 44 10/06/2017   LDLCALC 192 (H) 10/06/2017   TRIG 142 10/06/2017   CHOLHDL 6.0 10/06/2017   Lab Results  Component Value Date   TSH 1.479 10/05/2017     Total Time in preparing paper work, data evaluation and todays exam - 51 minutes  Lala Lund M.D on  10/08/2017 at 11:58 AM  [facility]   Office  [phone]

## 2017-10-08 NOTE — Discharge Instructions (Signed)
Follow with Primary MD Rosita Fire, MD in 7 days , resume metformin/Glucophage on September 2  Get CBC, CMP, checked  by Primary MD  in 5-7 days    Activity: As tolerated with Full fall precautions use walker/cane & assistance as needed  Disposition Home   Diet:  Heart Healthy and low carbohydrate diet, do Accu-Cheks QA CHS and showed the results to PCP next visit  For Heart failure patients - Check your Weight same time everyday, if you gain over 2 pounds, or you develop in leg swelling, experience more shortness of breath or chest pain, call your Primary MD immediately. Follow Cardiac Low Salt Diet and 1.5 lit/day fluid restriction.  Special Instructions: If you have smoked or chewed Tobacco  in the last 2 yrs please stop smoking, stop any regular Alcohol  and or any Recreational drug use.  On your next visit with your primary care physician please Get Medicines reviewed and adjusted.  Please request your Prim.MD to go over all Hospital Tests and Procedure/Radiological results at the follow up, please get all Hospital records sent to your Prim MD by signing hospital release before you go home.  If you experience worsening of your admission symptoms, develop shortness of breath, life threatening emergency, suicidal or homicidal thoughts you must seek medical attention immediately by calling 911 or calling your MD immediately  if symptoms less severe.  You Must read complete instructions/literature along with all the possible adverse reactions/side effects for all the Medicines you take and that have been prescribed to you. Take any new Medicines after you have completely understood and accpet all the possible adverse reactions/side effects.

## 2017-10-08 NOTE — Progress Notes (Signed)
  Cath films reviewed. Non-obstructive CAD.  No further SVT on tele.   Agree with plan for d/c today.   Continue ASA 81 and atorva 40.   Will need outpatient event monitor.   Will sign off. F/u [facility].   Glori Bickers, MD  11:50 AM

## 2017-10-08 NOTE — Care Management Note (Signed)
Case Management Note  Patient Details  Name: Kim Mills MRN: [id number] Date of Birth: 05-07-1940  Subjective/Objective:         Pt presents from home with family for SVT.  Pt active with [facility] for [facility] PT.             Action/Plan: Sharmon Revere with [facility] notified to resume [facility] PT and add RN. Rollator ordered from [facility] to be delivered to room prior to d/c.    Expected Discharge Date:  10/08/17               Expected Discharge Plan:  [place]  In-House Referral:  NA  Discharge planning Services  CM Consult  Post Acute Care Choice:  Durable Medical Equipment, Home Health Choice offered to:  Patient  DME Arranged:  Walker rolling with seat DME Agency:  [place] Arranged:  PT, RN [facility] Agency:  [place]  Status of Service:  Completed, signed off  If discussed at [place] of Stay Meetings, dates discussed:    Additional Comments:  Claudie Leach, RN 10/08/2017, 12:40 PM

## 2017-10-10 ENCOUNTER — Encounter (HOSPITAL_COMMUNITY): Payer: Self-pay | Admitting: Cardiovascular Disease

## 2017-10-11 DIAGNOSIS — E1165 Type 2 diabetes mellitus with hyperglycemia: Secondary | ICD-10-CM | POA: Diagnosis not present

## 2017-10-11 DIAGNOSIS — I10 Essential (primary) hypertension: Secondary | ICD-10-CM | POA: Diagnosis not present

## 2017-10-11 DIAGNOSIS — M7662 Achilles tendinitis, left leg: Secondary | ICD-10-CM | POA: Diagnosis not present

## 2017-10-11 DIAGNOSIS — M7661 Achilles tendinitis, right leg: Secondary | ICD-10-CM | POA: Diagnosis not present

## 2017-10-11 DIAGNOSIS — M150 Primary generalized (osteo)arthritis: Secondary | ICD-10-CM | POA: Diagnosis not present

## 2017-10-11 DIAGNOSIS — E039 Hypothyroidism, unspecified: Secondary | ICD-10-CM | POA: Diagnosis not present

## 2017-10-11 LAB — GLUCOSE, CAPILLARY: Glucose-Capillary: 114 mg/dL — ABNORMAL HIGH (ref 70–99)

## 2017-10-12 DIAGNOSIS — M7661 Achilles tendinitis, right leg: Secondary | ICD-10-CM | POA: Diagnosis not present

## 2017-10-14 DIAGNOSIS — E1165 Type 2 diabetes mellitus with hyperglycemia: Secondary | ICD-10-CM | POA: Diagnosis not present

## 2017-10-14 DIAGNOSIS — M7662 Achilles tendinitis, left leg: Secondary | ICD-10-CM | POA: Diagnosis not present

## 2017-10-14 DIAGNOSIS — M7661 Achilles tendinitis, right leg: Secondary | ICD-10-CM | POA: Diagnosis not present

## 2017-10-14 DIAGNOSIS — I10 Essential (primary) hypertension: Secondary | ICD-10-CM | POA: Diagnosis not present

## 2017-10-14 DIAGNOSIS — E039 Hypothyroidism, unspecified: Secondary | ICD-10-CM | POA: Diagnosis not present

## 2017-10-14 DIAGNOSIS — M150 Primary generalized (osteo)arthritis: Secondary | ICD-10-CM | POA: Diagnosis not present

## 2017-10-17 DIAGNOSIS — E1165 Type 2 diabetes mellitus with hyperglycemia: Secondary | ICD-10-CM | POA: Diagnosis not present

## 2017-10-17 DIAGNOSIS — M7662 Achilles tendinitis, left leg: Secondary | ICD-10-CM | POA: Diagnosis not present

## 2017-10-17 DIAGNOSIS — M150 Primary generalized (osteo)arthritis: Secondary | ICD-10-CM | POA: Diagnosis not present

## 2017-10-17 DIAGNOSIS — I10 Essential (primary) hypertension: Secondary | ICD-10-CM | POA: Diagnosis not present

## 2017-10-17 DIAGNOSIS — M7661 Achilles tendinitis, right leg: Secondary | ICD-10-CM | POA: Diagnosis not present

## 2017-10-17 DIAGNOSIS — E039 Hypothyroidism, unspecified: Secondary | ICD-10-CM | POA: Diagnosis not present

## 2017-10-20 DIAGNOSIS — I471 Supraventricular tachycardia: Secondary | ICD-10-CM | POA: Diagnosis not present

## 2017-10-20 DIAGNOSIS — E1165 Type 2 diabetes mellitus with hyperglycemia: Secondary | ICD-10-CM | POA: Diagnosis not present

## 2017-10-20 DIAGNOSIS — E039 Hypothyroidism, unspecified: Secondary | ICD-10-CM | POA: Diagnosis not present

## 2017-10-20 DIAGNOSIS — M179 Osteoarthritis of knee, unspecified: Secondary | ICD-10-CM | POA: Diagnosis not present

## 2017-10-21 DIAGNOSIS — M150 Primary generalized (osteo)arthritis: Secondary | ICD-10-CM | POA: Diagnosis not present

## 2017-10-21 DIAGNOSIS — M7661 Achilles tendinitis, right leg: Secondary | ICD-10-CM | POA: Diagnosis not present

## 2017-10-21 DIAGNOSIS — E1165 Type 2 diabetes mellitus with hyperglycemia: Secondary | ICD-10-CM | POA: Diagnosis not present

## 2017-10-21 DIAGNOSIS — E039 Hypothyroidism, unspecified: Secondary | ICD-10-CM | POA: Diagnosis not present

## 2017-10-21 DIAGNOSIS — M7662 Achilles tendinitis, left leg: Secondary | ICD-10-CM | POA: Diagnosis not present

## 2017-10-21 DIAGNOSIS — I10 Essential (primary) hypertension: Secondary | ICD-10-CM | POA: Diagnosis not present

## 2017-10-27 DIAGNOSIS — M7662 Achilles tendinitis, left leg: Secondary | ICD-10-CM | POA: Diagnosis not present

## 2017-10-27 DIAGNOSIS — M150 Primary generalized (osteo)arthritis: Secondary | ICD-10-CM | POA: Diagnosis not present

## 2017-10-27 DIAGNOSIS — E1165 Type 2 diabetes mellitus with hyperglycemia: Secondary | ICD-10-CM | POA: Diagnosis not present

## 2017-10-27 DIAGNOSIS — I10 Essential (primary) hypertension: Secondary | ICD-10-CM | POA: Diagnosis not present

## 2017-10-27 DIAGNOSIS — M7661 Achilles tendinitis, right leg: Secondary | ICD-10-CM | POA: Diagnosis not present

## 2017-10-27 DIAGNOSIS — E039 Hypothyroidism, unspecified: Secondary | ICD-10-CM | POA: Diagnosis not present

## 2017-11-02 DIAGNOSIS — M7661 Achilles tendinitis, right leg: Secondary | ICD-10-CM | POA: Diagnosis not present

## 2017-11-02 DIAGNOSIS — I10 Essential (primary) hypertension: Secondary | ICD-10-CM | POA: Diagnosis not present

## 2017-11-02 DIAGNOSIS — M7662 Achilles tendinitis, left leg: Secondary | ICD-10-CM | POA: Diagnosis not present

## 2017-11-02 DIAGNOSIS — M150 Primary generalized (osteo)arthritis: Secondary | ICD-10-CM | POA: Diagnosis not present

## 2017-11-02 DIAGNOSIS — E039 Hypothyroidism, unspecified: Secondary | ICD-10-CM | POA: Diagnosis not present

## 2017-11-02 DIAGNOSIS — E1165 Type 2 diabetes mellitus with hyperglycemia: Secondary | ICD-10-CM | POA: Diagnosis not present

## 2017-11-03 DIAGNOSIS — M150 Primary generalized (osteo)arthritis: Secondary | ICD-10-CM | POA: Diagnosis not present

## 2017-11-03 DIAGNOSIS — I10 Essential (primary) hypertension: Secondary | ICD-10-CM | POA: Diagnosis not present

## 2017-11-03 DIAGNOSIS — M7662 Achilles tendinitis, left leg: Secondary | ICD-10-CM | POA: Diagnosis not present

## 2017-11-03 DIAGNOSIS — E1165 Type 2 diabetes mellitus with hyperglycemia: Secondary | ICD-10-CM | POA: Diagnosis not present

## 2017-11-03 DIAGNOSIS — E039 Hypothyroidism, unspecified: Secondary | ICD-10-CM | POA: Diagnosis not present

## 2017-11-03 DIAGNOSIS — M7661 Achilles tendinitis, right leg: Secondary | ICD-10-CM | POA: Diagnosis not present

## 2017-11-07 DIAGNOSIS — E1165 Type 2 diabetes mellitus with hyperglycemia: Secondary | ICD-10-CM | POA: Diagnosis not present

## 2017-11-07 DIAGNOSIS — I10 Essential (primary) hypertension: Secondary | ICD-10-CM | POA: Diagnosis not present

## 2017-11-07 DIAGNOSIS — M7661 Achilles tendinitis, right leg: Secondary | ICD-10-CM | POA: Diagnosis not present

## 2017-11-07 DIAGNOSIS — E039 Hypothyroidism, unspecified: Secondary | ICD-10-CM | POA: Diagnosis not present

## 2017-11-07 DIAGNOSIS — M7662 Achilles tendinitis, left leg: Secondary | ICD-10-CM | POA: Diagnosis not present

## 2017-11-07 DIAGNOSIS — M150 Primary generalized (osteo)arthritis: Secondary | ICD-10-CM | POA: Diagnosis not present

## 2017-11-09 DIAGNOSIS — M7662 Achilles tendinitis, left leg: Secondary | ICD-10-CM | POA: Diagnosis not present

## 2017-11-09 DIAGNOSIS — M150 Primary generalized (osteo)arthritis: Secondary | ICD-10-CM | POA: Diagnosis not present

## 2017-11-09 DIAGNOSIS — M7661 Achilles tendinitis, right leg: Secondary | ICD-10-CM | POA: Diagnosis not present

## 2017-11-09 DIAGNOSIS — E1165 Type 2 diabetes mellitus with hyperglycemia: Secondary | ICD-10-CM | POA: Diagnosis not present

## 2017-11-09 DIAGNOSIS — E039 Hypothyroidism, unspecified: Secondary | ICD-10-CM | POA: Diagnosis not present

## 2017-11-09 DIAGNOSIS — I10 Essential (primary) hypertension: Secondary | ICD-10-CM | POA: Diagnosis not present

## 2017-11-10 DIAGNOSIS — E1165 Type 2 diabetes mellitus with hyperglycemia: Secondary | ICD-10-CM | POA: Diagnosis not present

## 2017-11-10 DIAGNOSIS — I10 Essential (primary) hypertension: Secondary | ICD-10-CM | POA: Diagnosis not present

## 2017-11-18 ENCOUNTER — Ambulatory Visit: Payer: Medicare Other | Admitting: Physician Assistant

## 2017-11-18 ENCOUNTER — Encounter

## 2017-11-21 DIAGNOSIS — H401131 Primary open-angle glaucoma, bilateral, mild stage: Secondary | ICD-10-CM | POA: Diagnosis not present

## 2017-11-22 DIAGNOSIS — M150 Primary generalized (osteo)arthritis: Secondary | ICD-10-CM | POA: Diagnosis not present

## 2017-11-22 DIAGNOSIS — M7661 Achilles tendinitis, right leg: Secondary | ICD-10-CM | POA: Diagnosis not present

## 2017-11-22 DIAGNOSIS — I10 Essential (primary) hypertension: Secondary | ICD-10-CM | POA: Diagnosis not present

## 2017-11-22 DIAGNOSIS — E1165 Type 2 diabetes mellitus with hyperglycemia: Secondary | ICD-10-CM | POA: Diagnosis not present

## 2017-11-22 DIAGNOSIS — E039 Hypothyroidism, unspecified: Secondary | ICD-10-CM | POA: Diagnosis not present

## 2017-11-22 DIAGNOSIS — M7662 Achilles tendinitis, left leg: Secondary | ICD-10-CM | POA: Diagnosis not present

## 2017-11-23 DIAGNOSIS — I2510 Atherosclerotic heart disease of native coronary artery without angina pectoris: Secondary | ICD-10-CM | POA: Insufficient documentation

## 2017-11-23 NOTE — Progress Notes (Signed)
Cardiology Office Note    Date:  11/28/2017   ID:  Kaelee, Pfeffer 1940-12-21, MRN [id number]  PCP:  Rosita Fire, MD  Cardiologist: No primary care provider on file. previous Dr. Lattie Haw EPS: None  Chief Complaint  Patient presents with  . Follow-up    History of Present Illness:  Kim Mills is a 77 y.o. female history of hypertension, HLD, DM who presented to the hospital with SVT converted with adenosine.  Troponins elevated with high heart rates and she has dyspnea on exertion with minor activity.  Cardiac catheterization 10/07/2017 showed mild coronary calcification and mild CAD with 40% proximal focal stenosis in the LAD 20 and 30% circumflex and 20% RCA normal LV function.  Recommend high potency statin therapy, optimal blood pressure diabetes management and weight loss.  Also aspirin 81 mg once daily.  2D echo 10/07/2017 normal LV function with grade 1 DD.  Patient comes in today for f/u. Denies chest pain, shortness of breath or fast rhythms. Doesn't feel good on lipitor but can't give specifics. No muscle aches. Hasn't been taking lipitor.   Past Medical History:  Diagnosis Date  . Arthritis   . Cataract   . Chest pain    Associated with weakness and fatigue  . Diabetes mellitus    A1c of 7.4 in 08/2010  . Glaucoma   . Hyperlipidemia    Lipid profile in 08/2010:190, 121, 48, 118; normal CBC and CMet  . Hypertension    Lipid profile in 08/2010:190, 121, 48, 118; normal CBC and CMet  . Hypothyroidism   . Obesity     Past Surgical History:  Procedure Laterality Date  . APPENDECTOMY    . CHOLECYSTECTOMY    . COLONOSCOPY N/A 04/01/2014   RMR: Melanosis coli. colonic polyps removed as described above.   Marland Kitchen LEFT HEART CATH AND CORONARY ANGIOGRAPHY N/A 10/07/2017   Procedure: LEFT HEART CATH AND CORONARY ANGIOGRAPHY;  Surgeon: Troy Sine, MD;  Location: [facility];  Service: Cardiovascular;  Laterality: N/A;  . THYROIDECTOMY, PARTIAL       Current Medications: Current Meds  Medication Sig  . aspirin EC 81 MG EC tablet Take 1 tablet (81 mg total) by mouth daily.  Marland Kitchen atorvastatin (LIPITOR) 40 MG tablet Take 1 tablet (40 mg total) by mouth daily at 6 PM.  . gabapentin (NEURONTIN) 300 MG capsule Take 300 mg by mouth at bedtime.   . Garlic (GARLIQUE) 366 MG TBEC Take 1 tablet by mouth every evening.  Marland Kitchen glipiZIDE (GLUCOTROL) 5 MG tablet Take 1 tablet by mouth 2 (two) times daily.  Marland Kitchen losartan-hydrochlorothiazide (HYZAAR) 100-12.5 MG tablet Take 1 tablet by mouth daily.  . meclizine (ANTIVERT) 25 MG tablet Take 25 mg by mouth 3 (three) times daily as needed for dizziness.  . metFORMIN (GLUCOPHAGE) 500 MG tablet Take 2 tablets (1,000 mg total) by mouth 2 (two) times daily.  . metoprolol tartrate (LOPRESSOR) 25 MG tablet Take 0.5 tablets (12.5 mg total) by mouth 2 (two) times daily.  . Omega-3 Fatty Acids (OMEGA-3 FISH OIL PO) Take 1 capsule by mouth every evening.  Marland Kitchen omeprazole (PRILOSEC) 40 MG capsule Take 1 capsule (40 mg total) by mouth daily.  Marland Kitchen SYNTHROID 150 MCG tablet Take 1 tablet by mouth daily.  Marland Kitchen tolterodine (DETROL LA) 4 MG 24 hr capsule Take 4 mg by mouth daily.  Marland Kitchen UNKNOWN TO PATIENT Take 1 tablet by mouth daily. Blood Pressure  . vitamin B-12 (CYANOCOBALAMIN) 1000 MCG  tablet Take 1 tablet (1,000 mcg total) by mouth daily.     Allergies:   Patient has no known allergies.   Social History   Socioeconomic History  . Marital status: Widowed    Spouse name: Not on file  . Number of children: Not on file  . Years of education: Not on file  . Highest education level: Not on file  Occupational History  . Not on file  Social Needs  . Financial resource strain: Not on file  . Food insecurity:    Worry: Not on file    Inability: Not on file  . Transportation needs:    Medical: Not on file    Non-medical: Not on file  Tobacco Use  . Smoking status: Never Smoker  . Smokeless tobacco: Never Used  Substance and  Sexual Activity  . Alcohol use: Never    Frequency: Never  . Drug use: Never  . Sexual activity: Not Currently  Lifestyle  . Physical activity:    Days per week: Not on file    Minutes per session: Not on file  . Stress: Not on file  Relationships  . Social connections:    Talks on phone: Not on file    Gets together: Not on file    Attends religious service: Not on file    Active member of club or organization: Not on file    Attends meetings of clubs or organizations: Not on file    Relationship status: Not on file  Other Topics Concern  . Not on file  Social History Narrative  . Not on file     Family History:  The patient's family history includes Diabetes in her sister; Heart block in her mother; Uterine cancer in her sister.   ROS:   Please see the history of present illness.    Review of Systems  Musculoskeletal: Positive for arthritis.   All other systems reviewed and are negative.   PHYSICAL EXAM:   VS:  BP (!) 160/74   Pulse 70   Ht 5\' 2"  (1.575 m)   Wt 192 lb 3.2 oz (87.2 kg)   SpO2 98%   BMI 35.15 kg/m   Physical Exam  GEN: Well nourished, well developed, in no acute distress  Neck: no JVD, carotid bruits, or masses Cardiac:RRR; no murmurs, rubs, or gallops  Respiratory:  clear to auscultation bilaterally, normal work of breathing GI: soft, nontender, nondistended, + BS Ext: right arm without hematoma or hemorrhage at cath site. Good radial or brachial pulse. Lower extremities without cyanosis, clubbing, or edema, Good distal pulses bilaterally Neuro:  Alert and Oriented x 3 Psych: euthymic mood, full affect  Wt Readings from Last 3 Encounters:  11/28/17 192 lb 3.2 oz (87.2 kg)  10/08/17 190 lb (86.2 kg)  06/20/14 202 lb 6.4 oz (91.8 kg)      Studies/Labs Reviewed:   EKG:  EKG is not ordered today.   Recent Labs: 10/05/2017: B Natriuretic Peptide 138.0; Magnesium 1.8; TSH 1.479 10/08/2017: ALT 29; BUN 16; Creatinine, Ser 1.03; Hemoglobin 10.0;  Platelets 218; Potassium 3.8; Sodium 141   Lipid Panel    Component Value Date/Time   CHOL 264 (H) 10/06/2017 1213   TRIG 142 10/06/2017 1213   HDL 44 10/06/2017 1213   CHOLHDL 6.0 10/06/2017 1213   VLDL 28 10/06/2017 1213   LDLCALC 192 (H) 10/06/2017 1213    Additional studies/ records that were reviewed today include:  Cardiac catheterization 10/07/2017   Prox RCA lesion  is 20% stenosed.  Prox LAD lesion is 40% stenosed.  Prox Cx lesion is 20% stenosed.  Mid Cx lesion is 30% stenosed.  The left ventricular ejection fraction is 55-65% by visual estimate.  The left ventricular systolic function is normal.  LV end diastolic pressure is normal.   There is mild coronary calcification and mild coronary artery disease with 40% proximal focal stenosis in the LAD immediately prior to the first diagonal vessel, 20 and 30% circumflex stenoses and 20% proximal RCA stenosis in a nondominant RCA.   LV function without wall motion abnormalities.  Ejection fraction 55 to 65%.   RECOMMENDATION: Medical therapy for CAD and SVT.   High potency statin therapy in an attempt to induce plaque regression.  Optimal management of blood pressure and diabetes.  Weight loss.    2D echo 8/30/2019Study Conclusions   - Left ventricle: The cavity size was normal. Systolic function was   normal. The estimated ejection fraction was in the range of 55%   to 60%. Wall motion was normal; there were no regional wall   motion abnormalities. Doppler parameters are consistent with   abnormal left ventricular relaxation (grade 1 diastolic   dysfunction). - Aortic valve: There was no significant regurgitation. - Mitral valve: Calcified annulus. Mildly thickened leaflets .   There was no significant regurgitation. - Right ventricle: Systolic function was low normal. - Tricuspid valve: There was trivial regurgitation. - Pulmonic valve: There was no significant regurgitation. - Pericardium, extracardiac: A  trivial pericardial effusion was   identified.   Impressions:   - Echo contrast used to better define wall motion. EF normal, no   significant wall motion abnormalities.      ASSESSMENT:    1. SVT (supraventricular tachycardia) ([place])   2. Coronary artery disease involving native coronary artery of native heart without angina pectoris   3. Essential hypertension   4. Mixed hyperlipidemia   5. Bilateral carotid bruits      PLAN:  In order of problems listed above:  SVT converted to normal sinus rhythm with adenosine, now on low dose metoprolol without recurrence.  Follow-up with cardiologist in 2 months to be established  CAD nonobstructive on cardiac catheterization recommend high-dose statin and aspirin 81 mg once daily.  Well-controlled diabetes, hypertension and HLD.  Hypertension patient's blood pressure is up today but she did not take her medication and went to 2 different offices before finding her way here today.  She does have a cuff at home but just needs to get batteries to keep a close eye on it.  2 g sodium diet discussed.  Mixed hyperlipidemia-patient not taking Lipitor but has a full prescription so said she is willing to try it again and keep track of any symptoms she might have.  I told her we could switch medications if she has problems with Lipitor.  Check fasting lipid panel and LFTs in 2 months.  Bilateral carotid bruits will check carotid Dopplers.    Medication Adjustments/Labs and Tests Ordered: Current medicines are reviewed at length with the patient today.  Concerns regarding medicines are outlined above.  Medication changes, Labs and Tests ordered today are listed in the Patient Instructions below. There are no Patient Instructions on file for this visit.   Sumner Boast, PA-C  11/28/2017 12:12 PM    [facility] [place], [place], [place]  [address] Phone: [phone]; Fax: [phone]

## 2017-11-25 DIAGNOSIS — E039 Hypothyroidism, unspecified: Secondary | ICD-10-CM | POA: Diagnosis not present

## 2017-11-25 DIAGNOSIS — M150 Primary generalized (osteo)arthritis: Secondary | ICD-10-CM | POA: Diagnosis not present

## 2017-11-25 DIAGNOSIS — M7661 Achilles tendinitis, right leg: Secondary | ICD-10-CM | POA: Diagnosis not present

## 2017-11-25 DIAGNOSIS — E1165 Type 2 diabetes mellitus with hyperglycemia: Secondary | ICD-10-CM | POA: Diagnosis not present

## 2017-11-25 DIAGNOSIS — I10 Essential (primary) hypertension: Secondary | ICD-10-CM | POA: Diagnosis not present

## 2017-11-25 DIAGNOSIS — M7662 Achilles tendinitis, left leg: Secondary | ICD-10-CM | POA: Diagnosis not present

## 2017-11-28 ENCOUNTER — Ambulatory Visit (INDEPENDENT_AMBULATORY_CARE_PROVIDER_SITE_OTHER): Payer: Medicare Other | Admitting: Physician Assistant

## 2017-11-28 ENCOUNTER — Encounter: Payer: Self-pay | Admitting: Physician Assistant

## 2017-11-28 VITALS — BP 160/74 | HR 70 | Ht 62.0 in | Wt 192.2 lb

## 2017-11-28 DIAGNOSIS — I2510 Atherosclerotic heart disease of native coronary artery without angina pectoris: Secondary | ICD-10-CM | POA: Diagnosis not present

## 2017-11-28 DIAGNOSIS — I471 Supraventricular tachycardia, unspecified: Secondary | ICD-10-CM

## 2017-11-28 DIAGNOSIS — I10 Essential (primary) hypertension: Secondary | ICD-10-CM | POA: Diagnosis not present

## 2017-11-28 DIAGNOSIS — E782 Mixed hyperlipidemia: Secondary | ICD-10-CM | POA: Diagnosis not present

## 2017-11-28 DIAGNOSIS — R0989 Other specified symptoms and signs involving the circulatory and respiratory systems: Secondary | ICD-10-CM | POA: Diagnosis not present

## 2017-11-28 NOTE — Patient Instructions (Signed)
Medication Instructions:  Your physician recommends that you continue on your current medications as directed. Please refer to the Current Medication list given to you today.  Restart Lipitor  If you need a refill on your cardiac medications before your next appointment, please call your pharmacy.   Lab work: Your physician recommends that you return for lab work in: 2 Months   If you have labs (blood work) drawn today and your tests are completely normal, you will receive your results only by: Marland Kitchen MyChart Message (if you have MyChart) OR . A paper copy in the mail If you have any lab test that is abnormal or we need to change your treatment, we will call you to review the results.  Testing/Procedures: Your physician has requested that you have a carotid duplex. This test is an ultrasound of the carotid arteries in your neck. It looks at blood flow through these arteries that supply the brain with blood. Allow one hour for this exam. There are no restrictions or special instructions.    Follow-Up: At [facility], you and your health needs are our priority.  As part of our continuing mission to provide you with exceptional heart care, we have created designated Provider Care Teams.  These Care Teams include your primary Cardiologist (physician) and Advanced Practice Providers (APPs -  Physician Assistants and Nurse Practitioners) who all work together to provide you with the care you need, when you need it. You will need a follow up appointment in 2 - 3 months.  Please call our office 2 months in advance to schedule this appointment.  You may see No primary care provider on file. or one of the following Advanced Practice Providers on your designated Care Team:   [place], PA-C [facility]) . Ermalinda Barrios, PA-C ([place])  Any Other Special Instructions Will Be Listed Below (If Applicable). Thank you for choosing [facility]!

## 2017-11-29 DIAGNOSIS — Z7984 Long term (current) use of oral hypoglycemic drugs: Secondary | ICD-10-CM | POA: Diagnosis not present

## 2017-11-29 DIAGNOSIS — Z6833 Body mass index (BMI) 33.0-33.9, adult: Secondary | ICD-10-CM | POA: Diagnosis not present

## 2017-11-29 DIAGNOSIS — E1165 Type 2 diabetes mellitus with hyperglycemia: Secondary | ICD-10-CM | POA: Diagnosis not present

## 2017-11-29 DIAGNOSIS — M150 Primary generalized (osteo)arthritis: Secondary | ICD-10-CM | POA: Diagnosis not present

## 2017-11-29 DIAGNOSIS — E039 Hypothyroidism, unspecified: Secondary | ICD-10-CM | POA: Diagnosis not present

## 2017-11-29 DIAGNOSIS — M7662 Achilles tendinitis, left leg: Secondary | ICD-10-CM | POA: Diagnosis not present

## 2017-11-29 DIAGNOSIS — I471 Supraventricular tachycardia: Secondary | ICD-10-CM | POA: Diagnosis not present

## 2017-11-29 DIAGNOSIS — M7661 Achilles tendinitis, right leg: Secondary | ICD-10-CM | POA: Diagnosis not present

## 2017-11-29 DIAGNOSIS — E669 Obesity, unspecified: Secondary | ICD-10-CM | POA: Diagnosis not present

## 2017-11-29 DIAGNOSIS — I10 Essential (primary) hypertension: Secondary | ICD-10-CM | POA: Diagnosis not present

## 2017-11-29 DIAGNOSIS — E785 Hyperlipidemia, unspecified: Secondary | ICD-10-CM | POA: Diagnosis not present

## 2017-12-02 DIAGNOSIS — M7662 Achilles tendinitis, left leg: Secondary | ICD-10-CM | POA: Diagnosis not present

## 2017-12-02 DIAGNOSIS — M7661 Achilles tendinitis, right leg: Secondary | ICD-10-CM | POA: Diagnosis not present

## 2017-12-02 DIAGNOSIS — E1165 Type 2 diabetes mellitus with hyperglycemia: Secondary | ICD-10-CM | POA: Diagnosis not present

## 2017-12-02 DIAGNOSIS — I471 Supraventricular tachycardia: Secondary | ICD-10-CM | POA: Diagnosis not present

## 2017-12-02 DIAGNOSIS — I10 Essential (primary) hypertension: Secondary | ICD-10-CM | POA: Diagnosis not present

## 2017-12-02 DIAGNOSIS — M150 Primary generalized (osteo)arthritis: Secondary | ICD-10-CM | POA: Diagnosis not present

## 2017-12-06 DIAGNOSIS — M150 Primary generalized (osteo)arthritis: Secondary | ICD-10-CM | POA: Diagnosis not present

## 2017-12-06 DIAGNOSIS — I10 Essential (primary) hypertension: Secondary | ICD-10-CM | POA: Diagnosis not present

## 2017-12-06 DIAGNOSIS — I471 Supraventricular tachycardia: Secondary | ICD-10-CM | POA: Diagnosis not present

## 2017-12-06 DIAGNOSIS — E1165 Type 2 diabetes mellitus with hyperglycemia: Secondary | ICD-10-CM | POA: Diagnosis not present

## 2017-12-06 DIAGNOSIS — M7661 Achilles tendinitis, right leg: Secondary | ICD-10-CM | POA: Diagnosis not present

## 2017-12-06 DIAGNOSIS — M7662 Achilles tendinitis, left leg: Secondary | ICD-10-CM | POA: Diagnosis not present

## 2017-12-20 DIAGNOSIS — M7661 Achilles tendinitis, right leg: Secondary | ICD-10-CM | POA: Diagnosis not present

## 2017-12-20 DIAGNOSIS — I10 Essential (primary) hypertension: Secondary | ICD-10-CM | POA: Diagnosis not present

## 2017-12-20 DIAGNOSIS — M150 Primary generalized (osteo)arthritis: Secondary | ICD-10-CM | POA: Diagnosis not present

## 2017-12-20 DIAGNOSIS — M7662 Achilles tendinitis, left leg: Secondary | ICD-10-CM | POA: Diagnosis not present

## 2017-12-20 DIAGNOSIS — E1165 Type 2 diabetes mellitus with hyperglycemia: Secondary | ICD-10-CM | POA: Diagnosis not present

## 2017-12-20 DIAGNOSIS — I471 Supraventricular tachycardia: Secondary | ICD-10-CM | POA: Diagnosis not present

## 2017-12-21 DIAGNOSIS — M7662 Achilles tendinitis, left leg: Secondary | ICD-10-CM | POA: Diagnosis not present

## 2017-12-21 DIAGNOSIS — I10 Essential (primary) hypertension: Secondary | ICD-10-CM | POA: Diagnosis not present

## 2017-12-21 DIAGNOSIS — M150 Primary generalized (osteo)arthritis: Secondary | ICD-10-CM | POA: Diagnosis not present

## 2017-12-21 DIAGNOSIS — I471 Supraventricular tachycardia: Secondary | ICD-10-CM | POA: Diagnosis not present

## 2017-12-21 DIAGNOSIS — E1165 Type 2 diabetes mellitus with hyperglycemia: Secondary | ICD-10-CM | POA: Diagnosis not present

## 2017-12-21 DIAGNOSIS — M7661 Achilles tendinitis, right leg: Secondary | ICD-10-CM | POA: Diagnosis not present

## 2017-12-27 DIAGNOSIS — I471 Supraventricular tachycardia: Secondary | ICD-10-CM | POA: Diagnosis not present

## 2017-12-27 DIAGNOSIS — E1165 Type 2 diabetes mellitus with hyperglycemia: Secondary | ICD-10-CM | POA: Diagnosis not present

## 2017-12-27 DIAGNOSIS — M7662 Achilles tendinitis, left leg: Secondary | ICD-10-CM | POA: Diagnosis not present

## 2017-12-27 DIAGNOSIS — M150 Primary generalized (osteo)arthritis: Secondary | ICD-10-CM | POA: Diagnosis not present

## 2017-12-27 DIAGNOSIS — I10 Essential (primary) hypertension: Secondary | ICD-10-CM | POA: Diagnosis not present

## 2017-12-27 DIAGNOSIS — M7661 Achilles tendinitis, right leg: Secondary | ICD-10-CM | POA: Diagnosis not present

## 2017-12-29 DIAGNOSIS — M7661 Achilles tendinitis, right leg: Secondary | ICD-10-CM | POA: Diagnosis not present

## 2017-12-29 DIAGNOSIS — E1165 Type 2 diabetes mellitus with hyperglycemia: Secondary | ICD-10-CM | POA: Diagnosis not present

## 2017-12-29 DIAGNOSIS — I10 Essential (primary) hypertension: Secondary | ICD-10-CM | POA: Diagnosis not present

## 2017-12-29 DIAGNOSIS — M150 Primary generalized (osteo)arthritis: Secondary | ICD-10-CM | POA: Diagnosis not present

## 2017-12-29 DIAGNOSIS — I471 Supraventricular tachycardia: Secondary | ICD-10-CM | POA: Diagnosis not present

## 2017-12-29 DIAGNOSIS — M7662 Achilles tendinitis, left leg: Secondary | ICD-10-CM | POA: Diagnosis not present

## 2018-01-02 DIAGNOSIS — M7662 Achilles tendinitis, left leg: Secondary | ICD-10-CM | POA: Diagnosis not present

## 2018-01-02 DIAGNOSIS — I471 Supraventricular tachycardia: Secondary | ICD-10-CM | POA: Diagnosis not present

## 2018-01-02 DIAGNOSIS — M7661 Achilles tendinitis, right leg: Secondary | ICD-10-CM | POA: Diagnosis not present

## 2018-01-02 DIAGNOSIS — M150 Primary generalized (osteo)arthritis: Secondary | ICD-10-CM | POA: Diagnosis not present

## 2018-01-02 DIAGNOSIS — I10 Essential (primary) hypertension: Secondary | ICD-10-CM | POA: Diagnosis not present

## 2018-01-02 DIAGNOSIS — E1165 Type 2 diabetes mellitus with hyperglycemia: Secondary | ICD-10-CM | POA: Diagnosis not present

## 2018-01-06 DIAGNOSIS — E1165 Type 2 diabetes mellitus with hyperglycemia: Secondary | ICD-10-CM | POA: Diagnosis not present

## 2018-01-06 DIAGNOSIS — M7661 Achilles tendinitis, right leg: Secondary | ICD-10-CM | POA: Diagnosis not present

## 2018-01-06 DIAGNOSIS — M7662 Achilles tendinitis, left leg: Secondary | ICD-10-CM | POA: Diagnosis not present

## 2018-01-06 DIAGNOSIS — M150 Primary generalized (osteo)arthritis: Secondary | ICD-10-CM | POA: Diagnosis not present

## 2018-01-06 DIAGNOSIS — I471 Supraventricular tachycardia: Secondary | ICD-10-CM | POA: Diagnosis not present

## 2018-01-06 DIAGNOSIS — I10 Essential (primary) hypertension: Secondary | ICD-10-CM | POA: Diagnosis not present

## 2018-01-09 DIAGNOSIS — E1165 Type 2 diabetes mellitus with hyperglycemia: Secondary | ICD-10-CM | POA: Diagnosis not present

## 2018-01-09 DIAGNOSIS — M7661 Achilles tendinitis, right leg: Secondary | ICD-10-CM | POA: Diagnosis not present

## 2018-01-09 DIAGNOSIS — M7662 Achilles tendinitis, left leg: Secondary | ICD-10-CM | POA: Diagnosis not present

## 2018-01-09 DIAGNOSIS — I471 Supraventricular tachycardia: Secondary | ICD-10-CM | POA: Diagnosis not present

## 2018-01-09 DIAGNOSIS — M150 Primary generalized (osteo)arthritis: Secondary | ICD-10-CM | POA: Diagnosis not present

## 2018-01-09 DIAGNOSIS — I10 Essential (primary) hypertension: Secondary | ICD-10-CM | POA: Diagnosis not present

## 2018-01-10 ENCOUNTER — Ambulatory Visit (INDEPENDENT_AMBULATORY_CARE_PROVIDER_SITE_OTHER): Payer: Medicare Other | Admitting: Family Medicine

## 2018-01-10 ENCOUNTER — Other Ambulatory Visit: Payer: Self-pay

## 2018-01-10 ENCOUNTER — Encounter: Payer: Self-pay | Admitting: Family Medicine

## 2018-01-10 VITALS — BP 142/78 | HR 72 | Temp 98.1°F | Resp 16 | Ht 62.0 in | Wt 186.0 lb

## 2018-01-10 DIAGNOSIS — E1140 Type 2 diabetes mellitus with diabetic neuropathy, unspecified: Secondary | ICD-10-CM | POA: Insufficient documentation

## 2018-01-10 DIAGNOSIS — R269 Unspecified abnormalities of gait and mobility: Secondary | ICD-10-CM | POA: Diagnosis not present

## 2018-01-10 DIAGNOSIS — Z86010 Personal history of colonic polyps: Secondary | ICD-10-CM | POA: Diagnosis not present

## 2018-01-10 DIAGNOSIS — E1143 Type 2 diabetes mellitus with diabetic autonomic (poly)neuropathy: Secondary | ICD-10-CM

## 2018-01-10 DIAGNOSIS — E039 Hypothyroidism, unspecified: Secondary | ICD-10-CM | POA: Diagnosis not present

## 2018-01-10 DIAGNOSIS — K219 Gastro-esophageal reflux disease without esophagitis: Secondary | ICD-10-CM | POA: Diagnosis not present

## 2018-01-10 DIAGNOSIS — I2510 Atherosclerotic heart disease of native coronary artery without angina pectoris: Secondary | ICD-10-CM

## 2018-01-10 DIAGNOSIS — E782 Mixed hyperlipidemia: Secondary | ICD-10-CM

## 2018-01-10 DIAGNOSIS — I10 Essential (primary) hypertension: Secondary | ICD-10-CM

## 2018-01-10 MED ORDER — METOPROLOL TARTRATE 25 MG PO TABS: 12.5000 mg | ORAL_TABLET | Freq: Two times a day (BID) | ORAL | 3 refills | Status: DC

## 2018-01-10 MED ORDER — OMEPRAZOLE 40 MG PO CPDR: 40.0000 mg | DELAYED_RELEASE_CAPSULE | Freq: Every day | ORAL | 3 refills | Status: DC

## 2018-01-10 MED ORDER — METFORMIN HCL 1000 MG PO TABS: 1000.0000 mg | ORAL_TABLET | Freq: Two times a day (BID) | ORAL | Status: DC

## 2018-01-10 NOTE — Progress Notes (Signed)
Subjective:    Patient ID: Kim Mills, female    DOB: 1940/08/07, 77 y.o.   MRN: [id number]  Patient presents for Establish Care (is not fasting)  Pt here to establish care, previous PCP Dr. Legrand Rams  She is here today with her daughter in law    Last visit a few months ago     Medications and history reviewed   DM- Taking metformin 500mg  daily  and glipizide 5mg  ,   she does not check her blood sugars   Last A1C was 6.9%    She has diabetic neuropathy- takes gabapentin, states she has numbness in her toes   - [facility]- she has glaucoma        She often shuffles her feet, her walking has been worse over the past 1.5 years  she has lift chair to help her maneuver at home too HaS OA in joints, uses cane to ambulate   Hypothyroidism- had partial thyroidectomy years ago, no cancer that she is aware  OAB on detrol  Vertigo- has meclizine , has had off and on for many years, often spells last for days, to weeks, she thinks she had some physical therapy     HTN-    Hyperlipidemia/CAD- she had SVT seen by cardiology, bruit heard has carotid US pending            Review Of Systems:  GEN- denies fatigue, fever, weight loss,weakness, recent illness HEENT- denies eye drainage, change in vision, nasal discharge, CVS- denies chest pain, palpitations RESP- denies SOB, cough, wheeze ABD- denies N/V, change in stools, abd pain GU- denies dysuria, hematuria, dribbling, incontinence MSK- denies joint pain, muscle aches, injury Neuro- denies headache, dizziness, syncope, seizure activity       Objective:    BP (!) 142/78   Pulse 72   Temp 98.1 F (36.7 C) (Oral)   Resp 16   Ht 5\' 2"  (1.575 m)   Wt 186 lb (84.4 kg)   SpO2 98%   BMI 34.02 kg/m  GEN- NAD, alert and oriented x3,sitting in chair HEENT- PERRL, EOMI, non injected sclera, pink conjunctiva, MMM, oropharynx clear Neck- Supple, no thyromegaly CVS- RRR, no  murmur RESP-CTAB ABD-NABS,soft,NT,ND MSK- decreased ROM Upper and Lower ext, decreased strength  short shuffling steps EXT- No edema Pulses- Radial, DP- 2+        Assessment & Plan:      Problem List Items Addressed This Visit      Unprioritized   CAD (coronary artery disease) - Primary    Reviewed cardiology note Discussed ultrasound for carotids Check lipids renal function Obtain records from previous PCP      Relevant Medications   metoprolol tartrate (LOPRESSOR) 25 MG tablet   Other Relevant Orders   CBC with Differential/Platelet (Completed)   Comprehensive metabolic panel (Completed)   Diabetic neuropathy (HCC)    Gait disturbance could be MTF, from OA, age, neuropathy, probable CVA in past.No family history of parkinsons      Relevant Medications   metFORMIN (GLUCOPHAGE) 1000 MG tablet   Other Relevant Orders   Hemoglobin A1c (Completed)   Gait disorder    Currently getting home PT      GERD (gastroesophageal reflux disease)    Continue PPI, has GI      Relevant Medications   omeprazole (PRILOSEC) 40 MG capsule   History of colonic polyps   Hyperlipidemia   Relevant Medications   metoprolol tartrate (LOPRESSOR) 25 MG tablet   Other  Relevant Orders   Lipid panel (Completed)   Hypertension    Mildly elevated today will trend Need to prevent dizzy spells, hypotension      Relevant Medications   metoprolol tartrate (LOPRESSOR) 25 MG tablet   Hypothyroidism   Relevant Medications   metoprolol tartrate (LOPRESSOR) 25 MG tablet   Other Relevant Orders   TSH (Completed)   T3, free (Completed)   T4, free (Completed)   Type 2 diabetes mellitus ([place])    Goal A1C 7% unless she has hypoglycemia episodes Check today Continue metformin and glipizide      Relevant Medications   metFORMIN (GLUCOPHAGE) 1000 MG tablet      Note: This dictation was prepared with Dragon dictation along with smaller phrase technology. Any transcriptional errors that  result from this process are unintentional.

## 2018-01-10 NOTE — Patient Instructions (Addendum)
Release of records- Dr. Legrand Rams We will call with lab results  Carotid ultrasound to be done  F/U 3 months

## 2018-01-11 DIAGNOSIS — R269 Unspecified abnormalities of gait and mobility: Secondary | ICD-10-CM | POA: Insufficient documentation

## 2018-01-11 LAB — CBC WITH DIFFERENTIAL/PLATELET
Basophils Absolute: 54 cells/uL (ref 0–200)
Basophils Relative: 0.5 %
Eosinophils Absolute: 300 cells/uL (ref 15–500)
Eosinophils Relative: 2.8 %
HCT: 29.5 % — ABNORMAL LOW (ref 35.0–45.0)
Hemoglobin: 9.7 g/dL — ABNORMAL LOW (ref 11.7–15.5)
Lymphs Abs: 3927 cells/uL — ABNORMAL HIGH (ref 850–3900)
MCH: 28.7 pg (ref 27.0–33.0)
MCHC: 32.9 g/dL (ref 32.0–36.0)
MCV: 87.3 fL (ref 80.0–100.0)
MPV: 11.1 fL (ref 7.5–12.5)
Monocytes Relative: 7.1 %
Neutro Abs: 5660 cells/uL (ref 1500–7800)
Neutrophils Relative %: 52.9 %
Platelets: 278 10*3/uL (ref 140–400)
RBC: 3.38 10*6/uL — ABNORMAL LOW (ref 3.80–5.10)
RDW: 15.7 % — ABNORMAL HIGH (ref 11.0–15.0)
Total Lymphocyte: 36.7 %
WBC mixed population: 760 cells/uL (ref 200–950)
WBC: 10.7 10*3/uL (ref 3.8–10.8)

## 2018-01-11 LAB — COMPREHENSIVE METABOLIC PANEL
AG Ratio: 1.4 (calc) (ref 1.0–2.5)
ALT: 14 U/L (ref 6–29)
AST: 15 U/L (ref 10–35)
Albumin: 4.1 g/dL (ref 3.6–5.1)
Alkaline phosphatase (APISO): 63 U/L (ref 33–130)
BUN/Creatinine Ratio: 23 (calc) — ABNORMAL HIGH (ref 6–22)
BUN: 23 mg/dL (ref 7–25)
CO2: 26 mmol/L (ref 20–32)
Calcium: 9.6 mg/dL (ref 8.6–10.4)
Chloride: 106 mmol/L (ref 98–110)
Creat: 0.98 mg/dL — ABNORMAL HIGH (ref 0.60–0.93)
Globulin: 2.9 g/dL (calc) (ref 1.9–3.7)
Glucose, Bld: 118 mg/dL — ABNORMAL HIGH (ref 65–99)
Potassium: 4.1 mmol/L (ref 3.5–5.3)
Sodium: 143 mmol/L (ref 135–146)
Total Bilirubin: 0.7 mg/dL (ref 0.2–1.2)
Total Protein: 7 g/dL (ref 6.1–8.1)

## 2018-01-11 LAB — HEMOGLOBIN A1C
Hgb A1c MFr Bld: 7.2 % of total Hgb — ABNORMAL HIGH (ref <5.7)
Mean Plasma Glucose: 160 (calc)
eAG (mmol/L): 8.9 (calc)

## 2018-01-11 LAB — LIPID PANEL
Cholesterol: 219 mg/dL — ABNORMAL HIGH (ref <200)
HDL: 40 mg/dL — ABNORMAL LOW (ref >50)
LDL Cholesterol (Calc): 152 mg/dL (calc) — ABNORMAL HIGH
Non-HDL Cholesterol (Calc): 179 mg/dL (calc) — ABNORMAL HIGH (ref <130)
Total CHOL/HDL Ratio: 5.5 (calc) — ABNORMAL HIGH (ref <5.0)
Triglycerides: 144 mg/dL (ref <150)

## 2018-01-11 LAB — T3, FREE: T3, Free: 2.3 pg/mL (ref 2.3–4.2)

## 2018-01-11 LAB — T4, FREE: Free T4: 1.6 ng/dL (ref 0.8–1.8)

## 2018-01-11 LAB — TSH: TSH: 0.44 mIU/L (ref 0.40–4.50)

## 2018-01-11 NOTE — Assessment & Plan Note (Signed)
Continue PPI, has GI

## 2018-01-11 NOTE — Assessment & Plan Note (Signed)
Currently getting home PT

## 2018-01-11 NOTE — Assessment & Plan Note (Signed)
Reviewed cardiology note Discussed ultrasound for carotids Check lipids renal function Obtain records from previous PCP

## 2018-01-11 NOTE — Assessment & Plan Note (Signed)
Goal A1C 7% unless she has hypoglycemia episodes Check today Continue metformin and glipizide

## 2018-01-11 NOTE — Assessment & Plan Note (Addendum)
Gait disturbance could be MTF, from OA, age, neuropathy, probable CVA in past.No family history of parkinsons

## 2018-01-11 NOTE — Assessment & Plan Note (Signed)
Mildly elevated today will trend Need to prevent dizzy spells, hypotension

## 2018-01-12 DIAGNOSIS — I471 Supraventricular tachycardia: Secondary | ICD-10-CM | POA: Diagnosis not present

## 2018-01-12 DIAGNOSIS — M7662 Achilles tendinitis, left leg: Secondary | ICD-10-CM | POA: Diagnosis not present

## 2018-01-12 DIAGNOSIS — M150 Primary generalized (osteo)arthritis: Secondary | ICD-10-CM | POA: Diagnosis not present

## 2018-01-12 DIAGNOSIS — E1165 Type 2 diabetes mellitus with hyperglycemia: Secondary | ICD-10-CM | POA: Diagnosis not present

## 2018-01-12 DIAGNOSIS — I10 Essential (primary) hypertension: Secondary | ICD-10-CM | POA: Diagnosis not present

## 2018-01-12 DIAGNOSIS — M7661 Achilles tendinitis, right leg: Secondary | ICD-10-CM | POA: Diagnosis not present

## 2018-01-18 DIAGNOSIS — M7662 Achilles tendinitis, left leg: Secondary | ICD-10-CM | POA: Diagnosis not present

## 2018-01-18 DIAGNOSIS — I10 Essential (primary) hypertension: Secondary | ICD-10-CM | POA: Diagnosis not present

## 2018-01-18 DIAGNOSIS — M7661 Achilles tendinitis, right leg: Secondary | ICD-10-CM | POA: Diagnosis not present

## 2018-01-18 DIAGNOSIS — I471 Supraventricular tachycardia: Secondary | ICD-10-CM | POA: Diagnosis not present

## 2018-01-18 DIAGNOSIS — E1165 Type 2 diabetes mellitus with hyperglycemia: Secondary | ICD-10-CM | POA: Diagnosis not present

## 2018-01-18 DIAGNOSIS — M150 Primary generalized (osteo)arthritis: Secondary | ICD-10-CM | POA: Diagnosis not present

## 2018-01-25 ENCOUNTER — Encounter: Payer: Self-pay | Admitting: *Deleted

## 2018-01-25 MED ORDER — SIMVASTATIN 40 MG PO TABS: 40.0000 mg | ORAL_TABLET | Freq: Every day | ORAL | 3 refills | Status: DC

## 2018-01-27 ENCOUNTER — Ambulatory Visit (HOSPITAL_COMMUNITY)
Admission: RE | Admit: 2018-01-27 | Discharge: 2018-01-27 | Disposition: A | Discharge status: Home / Self Care | Payer: Medicare Other | Source: Ambulatory Visit | Attending: Physician Assistant | Admitting: Physician Assistant

## 2018-01-27 DIAGNOSIS — R0989 Other specified symptoms and signs involving the circulatory and respiratory systems: Secondary | ICD-10-CM | POA: Diagnosis not present

## 2018-01-27 DIAGNOSIS — I6523 Occlusion and stenosis of bilateral carotid arteries: Secondary | ICD-10-CM | POA: Insufficient documentation

## 2018-01-30 ENCOUNTER — Telehealth: Payer: Self-pay | Admitting: *Deleted

## 2018-01-30 DIAGNOSIS — I6521 Occlusion and stenosis of right carotid artery: Secondary | ICD-10-CM

## 2018-01-30 NOTE — Telephone Encounter (Signed)
Called patient with test results. No answer. Left message to call back.  

## 2018-01-30 NOTE — Telephone Encounter (Signed)
-----   Message from Imogene Burn, PA-C sent at 01/30/2018  8:55 AM EST ----- Right carotid moderately stenosed. Please refer to [facility]

## 2018-02-02 ENCOUNTER — Telehealth: Payer: Self-pay

## 2018-02-02 DIAGNOSIS — I6521 Occlusion and stenosis of right carotid artery: Secondary | ICD-10-CM

## 2018-02-02 NOTE — Telephone Encounter (Signed)
-----   Message from Imogene Burn, PA-C sent at 01/30/2018  8:55 AM EST ----- Right carotid moderately stenosed. Please refer to [facility]

## 2018-02-02 NOTE — Telephone Encounter (Signed)
Patient notified of results, ref placed to [facility], pt aware, copied pcp

## 2018-02-10 NOTE — Telephone Encounter (Signed)
02/10/2018  Pt called today to say she has transportation issues and her daughter is about to have major surgery. She does not want to go to [facility] now. They have not called her yet to schedule apt.She has their phone number to call when she wants to make an apt

## 2018-02-20 ENCOUNTER — Other Ambulatory Visit: Payer: Self-pay | Admitting: Family Medicine

## 2018-02-20 ENCOUNTER — Other Ambulatory Visit: Payer: Self-pay | Admitting: *Deleted

## 2018-02-20 MED ORDER — LOSARTAN POTASSIUM 100 MG PO TABS: 100.0000 mg | ORAL_TABLET | Freq: Every day | ORAL | 3 refills | Status: DC

## 2018-02-20 MED ORDER — HYDROCHLOROTHIAZIDE 12.5 MG PO CAPS: 12.5000 mg | ORAL_CAPSULE | Freq: Every day | ORAL | 3 refills | Status: DC

## 2018-02-21 ENCOUNTER — Other Ambulatory Visit: Payer: Self-pay | Admitting: Family Medicine

## 2018-03-13 ENCOUNTER — Ambulatory Visit: Payer: Medicare Other | Admitting: Cardiovascular Disease

## 2018-03-13 DIAGNOSIS — R0989 Other specified symptoms and signs involving the circulatory and respiratory systems: Secondary | ICD-10-CM

## 2018-03-15 ENCOUNTER — Encounter: Payer: Self-pay | Admitting: Cardiovascular Disease

## 2018-04-12 ENCOUNTER — Other Ambulatory Visit: Payer: Self-pay

## 2018-04-12 ENCOUNTER — Encounter: Payer: Self-pay | Admitting: Family Medicine

## 2018-04-12 ENCOUNTER — Ambulatory Visit (INDEPENDENT_AMBULATORY_CARE_PROVIDER_SITE_OTHER): Payer: Medicare Other | Admitting: Family Medicine

## 2018-04-12 VITALS — BP 144/80 | HR 80 | Temp 97.9°F | Resp 14 | Ht 62.0 in | Wt 192.0 lb

## 2018-04-12 DIAGNOSIS — I10 Essential (primary) hypertension: Secondary | ICD-10-CM

## 2018-04-12 DIAGNOSIS — E782 Mixed hyperlipidemia: Secondary | ICD-10-CM

## 2018-04-12 DIAGNOSIS — E559 Vitamin D deficiency, unspecified: Secondary | ICD-10-CM | POA: Diagnosis not present

## 2018-04-12 DIAGNOSIS — D649 Anemia, unspecified: Secondary | ICD-10-CM | POA: Diagnosis not present

## 2018-04-12 DIAGNOSIS — E538 Deficiency of other specified B group vitamins: Secondary | ICD-10-CM | POA: Diagnosis not present

## 2018-04-12 DIAGNOSIS — I779 Disorder of arteries and arterioles, unspecified: Secondary | ICD-10-CM

## 2018-04-12 DIAGNOSIS — I739 Peripheral vascular disease, unspecified: Secondary | ICD-10-CM

## 2018-04-12 DIAGNOSIS — E1143 Type 2 diabetes mellitus with diabetic autonomic (poly)neuropathy: Secondary | ICD-10-CM

## 2018-04-12 DIAGNOSIS — M170 Bilateral primary osteoarthritis of knee: Secondary | ICD-10-CM

## 2018-04-12 DIAGNOSIS — K219 Gastro-esophageal reflux disease without esophagitis: Secondary | ICD-10-CM | POA: Diagnosis not present

## 2018-04-12 MED ORDER — LANCETS MISC: 11 refills | Status: DC

## 2018-04-12 MED ORDER — BLOOD GLUCOSE SYSTEM PAK KIT: PACK | 1 refills | Status: DC

## 2018-04-12 MED ORDER — BLOOD GLUCOSE TEST VI STRP: ORAL_STRIP | 11 refills | Status: DC

## 2018-04-12 MED ORDER — ZOSTER VAC RECOMB ADJUVANTED 50 MCG/0.5ML IM SUSR: 0.5000 mL | Freq: Once | INTRAMUSCULAR | 1 refills | Status: AC

## 2018-04-12 NOTE — Assessment & Plan Note (Signed)
Given info for vascular, pt to call and set up appt On statin drug

## 2018-04-12 NOTE — Assessment & Plan Note (Signed)
Discussed checking CBG three times a week, she agrees to this Recheck A1C goal < 8% with no hypoglycemia New meter sent to pharmacy

## 2018-04-12 NOTE — Progress Notes (Signed)
Subjective:    Patient ID: Kim Mills, female    DOB: August 05, 1940, 78 y.o.   MRN: [id number]  Patient presents for Fatigue (feels tired all the time); Weakness (trouble walking); Insomnia (difficulty going to sleep, waking up at night); and Nausea (states that she feels like something is stuck in throat and is causing her nausea and vomiting)    Gait instability- completed PT but still has trouble with knees and walking    OA- has never had knees looked at before, has chronic knee pain, They give out on her , wants to talk to a specialist , daughter states she complains all the time     DM-  Not checking CBG, last A1C 7.2%, denies hypoglycemia symptoms     GERD- omeprazole 40mg  , she is not taking her medication every day,  gets some vomiting after eating acidic foods, feels the reflux in her throat, states only takes pill every 3-4 days after she has an episode     Chronic anemia -     B12 def, took b12 for only 1 month, and vitamin D      Request shingles shot   Review Of Systems:  GEN- + fatigue, fever, weight loss,weakness, recent illness HEENT- denies eye drainage, change in vision, nasal discharge, CVS- denies chest pain, palpitations RESP- denies SOB, cough, wheeze ABD- denies N/V, change in stools, abd pain GU- denies dysuria, hematuria, dribbling, incontinence MSK- + joint pain, muscle aches, injury Neuro- denies headache, dizziness, syncope, seizure activity       Objective:    BP (!) 144/80   Pulse 80   Temp 97.9 F (36.6 C) (Oral)   Resp 14   Ht 5\' 2"  (1.575 m)   Wt 192 lb (87.1 kg)   SpO2 97%   BMI 35.12 kg/m  GEN- NAD, alert and oriented x3 HEENT- PERRL, EOMI, non injected sclera, pink conjunctiva, MMM, oropharynx clear Neck- Supple, no thyromegaly CVS- RRR, no murmur RESP-CTAB ABD-NABS,soft,NT,ND MSK- decreased ROM Upper and Lower ext, decreased strength, crepitus bilat knee, no effusion, fair ROM knee  short shuffling steps EXT- No  edema Pulses- Radial 2+ DP- diminished        Assessment & Plan:     Will not add anything for sleep at this time, she states gabapentin makes her sleep better than before  Problem List Items Addressed This Visit      Unprioritized   Anemia    Check B12 level may need injections for her anemia      B12 deficiency   Relevant Orders   Vitamin B12   Carotid artery disease without cerebral infarction [facility])    Given info for vascular, pt to call and set up appt On statin drug       Diabetic neuropathy ([place])   GERD (gastroesophageal reflux disease)    Recommend she take the omeprazole daily to help symptoms      Hyperlipidemia    Recheck lipids on statin drug       Relevant Orders   Lipid panel   Hypertension - Primary    [place], has appt with cardiology no changes today Concern she may not be taking meds daily      Relevant Orders   CBC with Differential/Platelet   Comprehensive metabolic panel   Type 2 diabetes mellitus ([place])    Discussed checking CBG three times a week, she agrees to this Recheck A1C goal < 8% with no hypoglycemia New meter sent to  pharmacy       Relevant Orders   Hemoglobin A1c   Lipid panel    Other Visit Diagnoses    Primary osteoarthritis of both knees       Referral to orthopedics    Relevant Orders   Ambulatory referral to Orthopedic Surgery   Vitamin D deficiency       Relevant Orders   Vitamin D, 25-hydroxy      Note: This dictation was prepared with Dragon dictation along with smaller phrase technology. Any transcriptional errors that result from this process are unintentional.

## 2018-04-12 NOTE — Assessment & Plan Note (Signed)
[place], has appt with cardiology no changes today Concern she may not be taking meds daily

## 2018-04-12 NOTE — Assessment & Plan Note (Signed)
Recommend she take the omeprazole daily to help symptoms

## 2018-04-12 NOTE — Assessment & Plan Note (Signed)
Check B12 level may need injections for her anemia

## 2018-04-12 NOTE — Patient Instructions (Addendum)
Referral to orthopedist in [place] diabetes meter- CHECK Sugar three times a week Take omeprazole every day  Call Vascular about appointment for carotid arteries [phone] I will call with lab results F/U 4 months for physical

## 2018-04-12 NOTE — Assessment & Plan Note (Signed)
Recheck lipids on statin drug

## 2018-04-13 LAB — CBC WITH DIFFERENTIAL/PLATELET
Absolute Monocytes: 743 cells/uL (ref 200–950)
Basophils Absolute: 59 cells/uL (ref 0–200)
Basophils Relative: 0.5 %
Eosinophils Absolute: 366 cells/uL (ref 15–500)
Eosinophils Relative: 3.1 %
HCT: 28.6 % — ABNORMAL LOW (ref 35.0–45.0)
Hemoglobin: 9.3 g/dL — ABNORMAL LOW (ref 11.7–15.5)
Lymphs Abs: 4401 cells/uL — ABNORMAL HIGH (ref 850–3900)
MCH: 28.2 pg (ref 27.0–33.0)
MCHC: 32.5 g/dL (ref 32.0–36.0)
MCV: 86.7 fL (ref 80.0–100.0)
MPV: 10.8 fL (ref 7.5–12.5)
Monocytes Relative: 6.3 %
Neutro Abs: 6230 cells/uL (ref 1500–7800)
Neutrophils Relative %: 52.8 %
Platelets: 278 10*3/uL (ref 140–400)
RBC: 3.3 10*6/uL — ABNORMAL LOW (ref 3.80–5.10)
RDW: 16 % — ABNORMAL HIGH (ref 11.0–15.0)
Total Lymphocyte: 37.3 %
WBC: 11.8 10*3/uL — ABNORMAL HIGH (ref 3.8–10.8)

## 2018-04-13 LAB — COMPREHENSIVE METABOLIC PANEL
AG Ratio: 1.3 (calc) (ref 1.0–2.5)
ALT: 8 U/L (ref 6–29)
AST: 12 U/L (ref 10–35)
Albumin: 4 g/dL (ref 3.6–5.1)
Alkaline phosphatase (APISO): 62 U/L (ref 37–153)
BUN/Creatinine Ratio: 25 (calc) — ABNORMAL HIGH (ref 6–22)
BUN: 25 mg/dL (ref 7–25)
CO2: 26 mmol/L (ref 20–32)
Calcium: 9.3 mg/dL (ref 8.6–10.4)
Chloride: 110 mmol/L (ref 98–110)
Creat: 1 mg/dL — ABNORMAL HIGH (ref 0.60–0.93)
Globulin: 3 g/dL (calc) (ref 1.9–3.7)
Glucose, Bld: 116 mg/dL — ABNORMAL HIGH (ref 65–99)
Potassium: 4.2 mmol/L (ref 3.5–5.3)
Sodium: 143 mmol/L (ref 135–146)
Total Bilirubin: 0.5 mg/dL (ref 0.2–1.2)
Total Protein: 7 g/dL (ref 6.1–8.1)

## 2018-04-13 LAB — HEMOGLOBIN A1C
Hgb A1c MFr Bld: 7.2 % of total Hgb — ABNORMAL HIGH (ref <5.7)
Mean Plasma Glucose: 160 (calc)
eAG (mmol/L): 8.9 (calc)

## 2018-04-13 LAB — LIPID PANEL
Cholesterol: 148 mg/dL (ref <200)
HDL: 40 mg/dL — ABNORMAL LOW (ref >=50)
LDL Cholesterol (Calc): 83 mg/dL (calc)
Non-HDL Cholesterol (Calc): 108 mg/dL (calc) (ref <130)
Total CHOL/HDL Ratio: 3.7 (calc) (ref <5.0)
Triglycerides: 151 mg/dL — ABNORMAL HIGH (ref <150)

## 2018-04-13 LAB — VITAMIN D 25 HYDROXY (VIT D DEFICIENCY, FRACTURES): Vit D, 25-Hydroxy: 20 ng/mL — ABNORMAL LOW (ref 30–100)

## 2018-04-13 LAB — VITAMIN B12: Vitamin B-12: 288 pg/mL (ref 200–1100)

## 2018-04-19 ENCOUNTER — Other Ambulatory Visit: Payer: Self-pay | Admitting: Family Medicine

## 2018-04-20 ENCOUNTER — Other Ambulatory Visit: Payer: Self-pay | Admitting: *Deleted

## 2018-04-20 MED ORDER — CYANOCOBALAMIN 1000 MCG/ML IJ SOLN: 1000.0000 ug | INTRAMUSCULAR | 0 refills | Status: DC

## 2018-04-20 MED ORDER — VITAMIN D (ERGOCALCIFEROL) 1.25 MG (50000 UNIT) PO CAPS: 50000.0000 [IU] | ORAL_CAPSULE | ORAL | 0 refills | Status: DC

## 2018-04-27 ENCOUNTER — Ambulatory Visit: Payer: Medicare Other | Admitting: Cardiovascular Disease

## 2018-05-16 ENCOUNTER — Telehealth (INDEPENDENT_AMBULATORY_CARE_PROVIDER_SITE_OTHER): Payer: Self-pay | Admitting: *Deleted

## 2018-05-16 NOTE — Telephone Encounter (Signed)
Called pt and home# and mobile # mailbox was full, unable to lvm to ask pt to covid-19 pre screening questions.

## 2018-05-17 ENCOUNTER — Ambulatory Visit (INDEPENDENT_AMBULATORY_CARE_PROVIDER_SITE_OTHER): Payer: Medicare Other | Admitting: Orthopedic Surgery

## 2018-05-18 ENCOUNTER — Other Ambulatory Visit: Payer: Self-pay | Admitting: Family Medicine

## 2018-05-30 ENCOUNTER — Ambulatory Visit: Payer: Medicare Other | Admitting: Student

## 2018-07-04 ENCOUNTER — Other Ambulatory Visit: Payer: Self-pay | Admitting: Family Medicine

## 2018-07-04 NOTE — Telephone Encounter (Signed)
Requested Prescriptions   Pending Prescriptions Disp Refills  . gabapentin (NEURONTIN) 300 MG capsule [Pharmacy Med Name: GABAPENTIN 300 MG CAP] 30 capsule 3    Sig: TAKE ONE CAPSULE BY MOUTH EVERY NIGHT AT BEDTIME   Last OV 04/12/2018 Last written 02/21/2018

## 2018-07-05 ENCOUNTER — Encounter: Payer: Medicare Other | Admitting: Student

## 2018-07-05 ENCOUNTER — Encounter: Payer: Self-pay | Admitting: Student

## 2018-07-05 ENCOUNTER — Other Ambulatory Visit: Payer: Self-pay

## 2018-07-05 NOTE — Progress Notes (Signed)
Patient unable to be contacted for Virtual Visit. No answer and no VM.   This encounter was created in error - please disregard. This encounter was created in error - please disregard.

## 2018-07-19 ENCOUNTER — Ambulatory Visit (INDEPENDENT_AMBULATORY_CARE_PROVIDER_SITE_OTHER): Payer: Medicare Other | Admitting: Orthopedic Surgery

## 2018-07-19 ENCOUNTER — Other Ambulatory Visit: Payer: Self-pay

## 2018-07-19 ENCOUNTER — Encounter: Payer: Self-pay | Admitting: Orthopedic Surgery

## 2018-07-19 ENCOUNTER — Ambulatory Visit: Payer: Self-pay

## 2018-07-19 ENCOUNTER — Ambulatory Visit (INDEPENDENT_AMBULATORY_CARE_PROVIDER_SITE_OTHER): Payer: Medicare Other

## 2018-07-19 DIAGNOSIS — M25561 Pain in right knee: Secondary | ICD-10-CM

## 2018-07-19 DIAGNOSIS — E538 Deficiency of other specified B group vitamins: Secondary | ICD-10-CM

## 2018-07-19 DIAGNOSIS — M1711 Unilateral primary osteoarthritis, right knee: Secondary | ICD-10-CM

## 2018-07-19 DIAGNOSIS — M25562 Pain in left knee: Secondary | ICD-10-CM

## 2018-07-19 DIAGNOSIS — M1712 Unilateral primary osteoarthritis, left knee: Secondary | ICD-10-CM

## 2018-07-19 MED ORDER — BUPIVACAINE HCL 0.25 % IJ SOLN
4.0000 mL | INTRAMUSCULAR | Status: AC | PRN
  Administered 2018-07-19: 4 mL via INTRA_ARTICULAR

## 2018-07-19 MED ORDER — METHYLPREDNISOLONE ACETATE 40 MG/ML IJ SUSP
40.0000 mg | INTRAMUSCULAR | Status: AC | PRN
  Administered 2018-07-19: 40 mg via INTRA_ARTICULAR

## 2018-07-19 MED ORDER — CYANOCOBALAMIN 1000 MCG/ML IJ SOLN
1000.0000 ug | INTRAMUSCULAR | Status: DC
  Administered 2018-07-19 – 2019-08-17 (×3): 1000 ug via INTRAMUSCULAR

## 2018-07-19 MED ORDER — LIDOCAINE HCL 1 % IJ SOLN
5.0000 mL | INTRAMUSCULAR | Status: AC | PRN
  Administered 2018-07-19: 5 mL

## 2018-07-19 NOTE — Progress Notes (Signed)
Patient came in today to receive her vitamin B12 injection. B12 was given in her left deltoid. Patient tolerated well. Advised to return in 30 days for next injection.

## 2018-07-19 NOTE — Progress Notes (Signed)
Office Visit Note   Patient: Kim Mills           Date of Birth: 1941/02/08           MRN: [id number] Visit Date: 07/19/2018 Requested by: Alycia Rossetti, MD [address] [place], [place] [address] PCP: Alycia Rossetti, MD  Subjective: Chief Complaint  Patient presents with  . Right Knee - Pain  . Left Knee - Pain    HPI: Kim Mills is a patient with bilateral knee pain.  She is had pain for years.  No prior treatment.  Left knee hurts worse than right.  She does describe weakness and giving way with some stiffness locking and popping.  Patient describes decreased walking endurance.  Does not wake her from sleep at night.  She lives with her son.  She cannot take anti-inflammatories secondary to reflux.  She does have diabetes.              ROS: All systems reviewed are negative as they relate to the chief complaint within the history of present illness.  Patient denies  fevers or chills.   Assessment & Plan: Visit Diagnoses:  1. Pain in both knees, unspecified chronicity   2. Unilateral primary osteoarthritis, left knee   3. Unilateral primary osteoarthritis, right knee     Plan: Impression is bilateral knee arthritis.  We will try cortisone injection today and next week.  I would like to get her preapproved for gel injection.  She does not really want to consider total knee replacement.  I will see her back in a week and I do want her to monitor her to her her blood glucose after this injection.  This patient is diagnosed with osteoarthritis of the knee(s).    Radiographs show evidence of joint space narrowing, osteophytes, subchondral sclerosis and/or subchondral cysts.  This patient has knee pain which interferes with functional and activities of daily living.    This patient has experienced inadequate response, adverse effects and/or intolerance with conservative treatments such as acetaminophen, NSAIDS, topical creams, physical therapy or regular exercise, knee  bracing and/or weight loss.   This patient has experienced inadequate response or has a contraindication to intra articular steroid injections for at least 3 months.   This patient is not scheduled to have a total knee replacement within 6 months of starting treatment with viscosupplementation.   Follow-Up Instructions: Return in about 1 week (around 07/26/2018).   Orders:  Orders Placed This Encounter  Procedures  . XR Knee 1-2 Views Right  . XR KNEE 3 VIEW LEFT   No orders of the defined types were placed in this encounter.     Procedures: Large Joint Inj: L knee on 07/19/2018 5:08 PM Indications: diagnostic evaluation, joint swelling and pain Details: 18 G 1.5 in needle, superolateral approach  Arthrogram: No  Medications: 5 mL lidocaine 1 %; 40 mg methylPREDNISolone acetate 40 MG/ML; 4 mL bupivacaine 0.25 % Outcome: tolerated well, no immediate complications Procedure, treatment alternatives, risks and benefits explained, specific risks discussed. Consent was given by the patient. Immediately prior to procedure a time out was called to verify the correct patient, procedure, equipment, support staff and site/side marked as required. Patient was prepped and draped in the usual sterile fashion.       Clinical Data: No additional findings.  Objective: Vital Signs: There were no vitals taken for this visit.  Physical Exam:   Constitutional: Patient appears well-developed HEENT:  Head: Normocephalic  Eyes:EOM are normal Neck: Normal range of motion Cardiovascular: Normal rate Pulmonary/chest: Effort normal Neurologic: Patient is alert Skin: Skin is warm Psychiatric: Patient has normal mood and affect    Ortho Exam: Ortho exam demonstrates perfused feet.  Knee range of motion is about 5 degrees from full extension to past 90 to flexion.  Mild patellofemoral crepitus is present collateral crucial ligaments are stable extensor mechanism is intact no groin pain with  internal X rotation of the leg no other masses lymphadenopathy or skin changes noted in the bilateral knee region.  Specialty Comments:  No specialty comments available.  Imaging: Xr Knee 1-2 Views Right  Result Date: 07/19/2018 AP lateral merchant right knee reviewed.  Tricompartmental arthritis is present worse in the lateral compartment.  Bone slightly osteopenic.  No acute fracture.  Xr Knee 3 View Left  Result Date: 07/19/2018 AP lateral merchant left knee reviewed.  Slight valgus alignment present.  Tricompartmental osteoarthritic changes are present worse in the lateral and patellofemoral compartments.  Bones are osteopenic.  No acute fracture    PMFS History: Patient Active Problem List   Diagnosis Date Noted  . B12 deficiency 04/12/2018  . Anemia 04/12/2018  . Carotid artery disease without cerebral infarction ([place]) 04/12/2018  . Gait disorder 01/11/2018  . Diabetic neuropathy ([place]) 01/10/2018  . Bilateral carotid bruits 11/28/2017  . CAD (coronary artery disease) 11/23/2017  . Elevated troponin   . SVT (supraventricular tachycardia) ([facility]) 10/05/2017  . History of colonic polyps   . Encounter for screening colonoscopy 03/13/2014  . Constipation 03/13/2014  . GERD (gastroesophageal reflux disease) 03/13/2014  . Hyperlipidemia   . Type 2 diabetes mellitus ([facility])   . Arthritis   . Hypothyroidism   . Chest pain   . Obesity   . Hypertension    Past Medical History:  Diagnosis Date  . Arthritis   . Cataract   . Chest pain    Associated with weakness and fatigue  . Diabetes mellitus    A1c of 7.4 in 08/2010  . GERD (gastroesophageal reflux disease)   . Glaucoma   . Hyperlipidemia    Lipid profile in 08/2010:190, 121, 48, 118; normal CBC and CMet  . Hypertension    Lipid profile in 08/2010:190, 121, 48, 118; normal CBC and CMet  . Hypothyroidism   . Obesity     Family History  Problem Relation Age of Onset  . Heart block Mother   . Uterine cancer Sister   .  Diabetes Sister     Past Surgical History:  Procedure Laterality Date  . APPENDECTOMY    . CHOLECYSTECTOMY    . COLONOSCOPY N/A 04/01/2014   RMR: Melanosis coli. colonic polyps removed as described above.   Marland Kitchen LEFT HEART CATH AND CORONARY ANGIOGRAPHY N/A 10/07/2017   Procedure: LEFT HEART CATH AND CORONARY ANGIOGRAPHY;  Surgeon: Troy Sine, MD;  Location: [facility];  Service: Cardiovascular;  Laterality: N/A;  . THYROIDECTOMY, PARTIAL     Social History   Occupational History    Employer: RETIRED  Tobacco Use  . Smoking status: Never Smoker  . Smokeless tobacco: Never Used  Substance and Sexual Activity  . Alcohol use: Never    Frequency: Never  . Drug use: Never  . Sexual activity: Not Currently

## 2018-07-20 ENCOUNTER — Other Ambulatory Visit: Payer: Self-pay | Admitting: Family Medicine

## 2018-07-26 ENCOUNTER — Ambulatory Visit: Payer: Medicare Other | Admitting: Orthopedic Surgery

## 2018-08-02 ENCOUNTER — Ambulatory Visit (INDEPENDENT_AMBULATORY_CARE_PROVIDER_SITE_OTHER): Payer: Medicare Other | Admitting: Orthopedic Surgery

## 2018-08-02 ENCOUNTER — Other Ambulatory Visit: Payer: Self-pay

## 2018-08-02 ENCOUNTER — Encounter: Payer: Self-pay | Admitting: Orthopedic Surgery

## 2018-08-02 VITALS — Ht 62.0 in | Wt 192.0 lb

## 2018-08-02 DIAGNOSIS — M1711 Unilateral primary osteoarthritis, right knee: Secondary | ICD-10-CM

## 2018-08-02 DIAGNOSIS — M1712 Unilateral primary osteoarthritis, left knee: Secondary | ICD-10-CM

## 2018-08-03 ENCOUNTER — Encounter: Payer: Self-pay | Admitting: Orthopedic Surgery

## 2018-08-03 DIAGNOSIS — M1711 Unilateral primary osteoarthritis, right knee: Secondary | ICD-10-CM

## 2018-08-03 MED ORDER — LIDOCAINE HCL 1 % IJ SOLN
5.0000 mL | INTRAMUSCULAR | Status: AC | PRN
  Administered 2018-08-03: 5 mL

## 2018-08-03 MED ORDER — BUPIVACAINE HCL 0.25 % IJ SOLN
4.0000 mL | INTRAMUSCULAR | Status: AC | PRN
  Administered 2018-08-03: 4 mL via INTRA_ARTICULAR

## 2018-08-03 MED ORDER — METHYLPREDNISOLONE ACETATE 40 MG/ML IJ SUSP
40.0000 mg | INTRAMUSCULAR | Status: AC | PRN
  Administered 2018-08-03: 40 mg via INTRA_ARTICULAR

## 2018-08-03 NOTE — Progress Notes (Signed)
   Procedure Note  Patient: Kim Mills             Date of Birth: Jun 15, 1940           MRN: [id number]             Visit Date: 08/02/2018  Procedures: Visit Diagnoses:  1. Unilateral primary osteoarthritis, left knee   2. Unilateral primary osteoarthritis, right knee     Large Joint Inj: R knee on 08/03/2018 10:12 PM Indications: diagnostic evaluation, joint swelling and pain Details: 18 G 1.5 in needle, superolateral approach  Arthrogram: No  Medications: 5 mL lidocaine 1 %; 40 mg methylPREDNISolone acetate 40 MG/ML; 4 mL bupivacaine 0.25 % Outcome: tolerated well, no immediate complications Procedure, treatment alternatives, risks and benefits explained, specific risks discussed. Consent was given by the patient. Immediately prior to procedure a time out was called to verify the correct patient, procedure, equipment, support staff and site/side marked as required. Patient was prepped and draped in the usual sterile fashion.     Patient presents for right knee cortisone injection.  Left knee was injected a week ago and her blood glucose did not go up too much.  Here for scheduled injection into the right knee.  We may consider gel injections as the next up once these wear off.This patient is diagnosed with osteoarthritis of the knee(s).    Radiographs show evidence of joint space narrowing, osteophytes, subchondral sclerosis and/or subchondral cysts.  This patient has knee pain which interferes with functional and activities of daily living.    This patient has experienced inadequate response, adverse effects and/or intolerance with conservative treatments such as acetaminophen, NSAIDS, topical creams, physical therapy or regular exercise, knee bracing and/or weight loss.   This patient has experienced inadequate response or has a contraindication to intra articular steroid injections for at least 3 months.   This patient is not scheduled to have a total knee replacement within  6 months of starting treatment with viscosupplementation.

## 2018-08-16 ENCOUNTER — Other Ambulatory Visit: Payer: Self-pay

## 2018-08-16 ENCOUNTER — Encounter: Payer: Medicare Other | Admitting: Family Medicine

## 2018-08-16 ENCOUNTER — Ambulatory Visit (INDEPENDENT_AMBULATORY_CARE_PROVIDER_SITE_OTHER): Payer: Medicare Other | Admitting: *Deleted

## 2018-08-16 DIAGNOSIS — E539 Vitamin B deficiency, unspecified: Secondary | ICD-10-CM

## 2018-08-16 DIAGNOSIS — E538 Deficiency of other specified B group vitamins: Secondary | ICD-10-CM | POA: Diagnosis not present

## 2018-08-31 ENCOUNTER — Other Ambulatory Visit: Payer: Self-pay | Admitting: Family Medicine

## 2018-10-02 ENCOUNTER — Ambulatory Visit (INDEPENDENT_AMBULATORY_CARE_PROVIDER_SITE_OTHER): Payer: Medicare Other | Admitting: Orthopedic Surgery

## 2018-10-02 ENCOUNTER — Encounter: Payer: Self-pay | Admitting: Orthopedic Surgery

## 2018-10-02 ENCOUNTER — Telehealth: Payer: Self-pay

## 2018-10-02 DIAGNOSIS — M1711 Unilateral primary osteoarthritis, right knee: Secondary | ICD-10-CM | POA: Diagnosis not present

## 2018-10-02 DIAGNOSIS — M1712 Unilateral primary osteoarthritis, left knee: Secondary | ICD-10-CM | POA: Diagnosis not present

## 2018-10-02 NOTE — Telephone Encounter (Signed)
Can we get auth for bilat gel injections? 

## 2018-10-03 ENCOUNTER — Encounter: Payer: Medicare Other | Admitting: Family Medicine

## 2018-10-04 ENCOUNTER — Encounter: Payer: Self-pay | Admitting: Orthopedic Surgery

## 2018-10-04 DIAGNOSIS — M1712 Unilateral primary osteoarthritis, left knee: Secondary | ICD-10-CM

## 2018-10-04 MED ORDER — METHYLPREDNISOLONE ACETATE 40 MG/ML IJ SUSP
40.0000 mg | INTRAMUSCULAR | Status: AC | PRN
  Administered 2018-10-04: 40 mg via INTRA_ARTICULAR

## 2018-10-04 MED ORDER — LIDOCAINE HCL 1 % IJ SOLN
5.0000 mL | INTRAMUSCULAR | Status: AC | PRN
  Administered 2018-10-04: 5 mL

## 2018-10-04 MED ORDER — BUPIVACAINE HCL 0.25 % IJ SOLN
4.0000 mL | INTRAMUSCULAR | Status: AC | PRN
  Administered 2018-10-04: 4 mL via INTRA_ARTICULAR

## 2018-10-04 NOTE — Progress Notes (Signed)
Office Visit Note   Patient: Kim Mills           Date of Birth: 01/29/1941           MRN: [id number] Visit Date: 10/02/2018 Requested by: Alycia Rossetti, MD [address] [place],  [place] [address] PCP: Alycia Rossetti, MD  Subjective: Chief Complaint  Patient presents with  . Right Knee - Pain  . Left Knee - Pain    HPI: Kim Mills is a patient with bilateral knee pain.  She reports left knee pain as well as right knee pain.  The right knee actually did reasonably well with the injection but only for a few days.  Left knee injection helped for several months.  Blood glucose was not too elevated after the last injection.  The right knee was injected 08/03/2018 with only several days of relief.  She does not really want to undergo any type of surgical procedure.  She wants to exhaust all nonoperative measures.              ROS: All systems reviewed are negative as they relate to the chief complaint within the history of present illness.  Patient denies  fevers or chills.   Assessment & Plan: Visit Diagnoses:  1. Unilateral primary osteoarthritis, left knee   2. Unilateral primary osteoarthritis, right knee     Plan: Impression is bilateral knee pain with several month relief from cortisone injection on the left and only several day relief on the right plan is Visco injection into the right knee when available and left knee cortisone injection today with Visco injection to follow.  Follow-up with me as needed for those Visco injections.  Left knee injection performed today.  Follow-Up Instructions: No follow-ups on file.   Orders:  No orders of the defined types were placed in this encounter.  No orders of the defined types were placed in this encounter.     Procedures: Large Joint Inj: L knee on 10/04/2018 7:33 AM Indications: diagnostic evaluation, joint swelling and pain Details: 18 G 1.5 in needle, superolateral approach  Arthrogram: No  Medications: 5 mL  lidocaine 1 %; 40 mg methylPREDNISolone acetate 40 MG/ML; 4 mL bupivacaine 0.25 % Outcome: tolerated well, no immediate complications Procedure, treatment alternatives, risks and benefits explained, specific risks discussed. Consent was given by the patient. Immediately prior to procedure a time out was called to verify the correct patient, procedure, equipment, support staff and site/side marked as required. Patient was prepped and draped in the usual sterile fashion.       Clinical Data: No additional findings.  Objective: Vital Signs: There were no vitals taken for this visit.  Physical Exam:   Constitutional: Patient appears well-developed HEENT:  Head: Normocephalic Eyes:EOM are normal Neck: Normal range of motion Cardiovascular: Normal rate Pulmonary/chest: Effort normal Neurologic: Patient is alert Skin: Skin is warm Psychiatric: Patient has normal mood and affect    Ortho Exam: Ortho exam demonstrates slightly antalgic gait to the left.  Palpable pedal pulses.  No groin pain with internal X rotation leg.  Trace effusion bilateral knees.  No other masses lymphadenopathy or skin changes noted in that knee region.  Range of motion is full extension to past 90 of flexion bilaterally.  Specialty Comments:  No specialty comments available.  Imaging: No results found.   PMFS History: Patient Active Problem List   Diagnosis Date Noted  . B12 deficiency 04/12/2018  . Anemia 04/12/2018  .  Carotid artery disease without cerebral infarction ([facility]) 04/12/2018  . Gait disorder 01/11/2018  . Diabetic neuropathy ([facility]) 01/10/2018  . Bilateral carotid bruits 11/28/2017  . CAD (coronary artery disease) 11/23/2017  . Elevated troponin   . SVT (supraventricular tachycardia) ([facility]) 10/05/2017  . History of colonic polyps   . Encounter for screening colonoscopy 03/13/2014  . Constipation 03/13/2014  . GERD (gastroesophageal reflux disease) 03/13/2014  . Hyperlipidemia   . Type 2  diabetes mellitus ([facility])   . Arthritis   . Hypothyroidism   . Chest pain   . Obesity   . Hypertension    Past Medical History:  Diagnosis Date  . Arthritis   . Cataract   . Chest pain    Associated with weakness and fatigue  . Diabetes mellitus    A1c of 7.4 in 08/2010  . GERD (gastroesophageal reflux disease)   . Glaucoma   . Hyperlipidemia    Lipid profile in 08/2010:190, 121, 48, 118; normal CBC and CMet  . Hypertension    Lipid profile in 08/2010:190, 121, 48, 118; normal CBC and CMet  . Hypothyroidism   . Obesity     Family History  Problem Relation Age of Onset  . Heart block Mother   . Uterine cancer Sister   . Diabetes Sister     Past Surgical History:  Procedure Laterality Date  . APPENDECTOMY    . CHOLECYSTECTOMY    . COLONOSCOPY N/A 04/01/2014   RMR: Melanosis coli. colonic polyps removed as described above.   Marland Kitchen LEFT HEART CATH AND CORONARY ANGIOGRAPHY N/A 10/07/2017   Procedure: LEFT HEART CATH AND CORONARY ANGIOGRAPHY;  Surgeon: Troy Sine, MD;  Location: [facility];  Service: Cardiovascular;  Laterality: N/A;  . THYROIDECTOMY, PARTIAL     Social History   Occupational History    Employer: RETIRED  Tobacco Use  . Smoking status: Never Smoker  . Smokeless tobacco: Never Used  Substance and Sexual Activity  . Alcohol use: Never    Frequency: Never  . Drug use: Never  . Sexual activity: Not Currently

## 2018-10-06 NOTE — Telephone Encounter (Signed)
Noted  

## 2018-10-13 ENCOUNTER — Telehealth: Payer: Self-pay

## 2018-10-13 NOTE — Telephone Encounter (Signed)
Submitted VOB for Monovisc, bilateral knee. 

## 2018-10-17 ENCOUNTER — Other Ambulatory Visit: Payer: Self-pay | Admitting: Family Medicine

## 2018-10-20 ENCOUNTER — Telehealth: Payer: Self-pay

## 2018-10-20 NOTE — Telephone Encounter (Signed)
Approved for Monovisc, bilateral knee. [place] has been met Secondary insurance [facility]) will pick up remaining eligible expenses at 100% No Co-pay No PA required  Appt. 10/23/2018 with Dr. Marlou Sa

## 2018-10-23 ENCOUNTER — Ambulatory Visit: Payer: Medicare Other | Admitting: Orthopedic Surgery

## 2018-11-09 ENCOUNTER — Other Ambulatory Visit: Payer: Self-pay | Admitting: Family Medicine

## 2018-12-15 ENCOUNTER — Other Ambulatory Visit: Payer: Self-pay | Admitting: Family Medicine

## 2018-12-28 ENCOUNTER — Other Ambulatory Visit: Payer: Self-pay | Admitting: *Deleted

## 2018-12-28 MED ORDER — GLIPIZIDE 5 MG PO TABS: 5.0000 mg | ORAL_TABLET | Freq: Two times a day (BID) | ORAL | 3 refills | Status: DC

## 2019-01-29 ENCOUNTER — Other Ambulatory Visit: Payer: Self-pay | Admitting: Family Medicine

## 2019-02-15 ENCOUNTER — Other Ambulatory Visit: Payer: Self-pay | Admitting: Family Medicine

## 2019-02-26 ENCOUNTER — Other Ambulatory Visit: Payer: Self-pay | Admitting: Family Medicine

## 2019-03-20 ENCOUNTER — Other Ambulatory Visit: Payer: Self-pay | Admitting: Family Medicine

## 2019-04-13 ENCOUNTER — Encounter: Payer: Self-pay | Admitting: Family Medicine

## 2019-04-13 ENCOUNTER — Other Ambulatory Visit: Payer: Self-pay

## 2019-04-13 ENCOUNTER — Ambulatory Visit (INDEPENDENT_AMBULATORY_CARE_PROVIDER_SITE_OTHER): Payer: Medicare Other | Admitting: Family Medicine

## 2019-04-13 VITALS — BP 148/70 | HR 84 | Temp 96.8°F | Resp 14

## 2019-04-13 DIAGNOSIS — E1143 Type 2 diabetes mellitus with diabetic autonomic (poly)neuropathy: Secondary | ICD-10-CM

## 2019-04-13 DIAGNOSIS — R310 Gross hematuria: Secondary | ICD-10-CM

## 2019-04-13 LAB — URINALYSIS, ROUTINE W REFLEX MICROSCOPIC
Bilirubin Urine: NEGATIVE
Hyaline Cast: NONE SEEN /LPF
Nitrite: POSITIVE — AB
Specific Gravity, Urine: 1.01 (ref 1.001–1.03)
pH: 5 (ref 5.0–8.0)

## 2019-04-13 LAB — MICROSCOPIC MESSAGE

## 2019-04-13 MED ORDER — SULFAMETHOXAZOLE-TRIMETHOPRIM 800-160 MG PO TABS: 1.0000 | ORAL_TABLET | Freq: Two times a day (BID) | ORAL | 0 refills | Status: DC

## 2019-04-13 NOTE — Progress Notes (Signed)
Subjective:    Patient ID: Kim Mills, female    DOB: May 20, 1940, 79 y.o.   MRN: [id number]  HPI Patient has not been seen in over a year.  She has an appointment Tuesday to speak with her primary care physician.  She has a history of diabetes mellitus.  She has not had a hemoglobin A1c in almost a year.  She is not checking her blood sugar however she continues to take Metformin and glipizide.  She reports that over the last 2 months she has had 2 separate episodes of blood in her urine.  She will notice blood after she urinates outlooks on the toilet tissue and squeeze.  She will also see a pink tenderness to the urine and will call.  This will then resolve after a few days.  She will then be fine for a few weeks and have another episode the last few days.  However 2 weeks ago she developed increased urinary frequency, gross hematuria.  She also reports polyuria and urgency but denies any dysuria.  Urinalysis today is grossly abnormal however the patient is on Azo making interpretation difficult.  She denies dysuria but she does complain of frequency urgency and gross hematuria. Past Medical History:  Diagnosis Date  . Arthritis   . Cataract   . Chest pain    Associated with weakness and fatigue  . Diabetes mellitus    A1c of 7.4 in 08/2010  . GERD (gastroesophageal reflux disease)   . Glaucoma   . Hyperlipidemia    Lipid profile in 08/2010:190, 121, 48, 118; normal CBC and CMet  . Hypertension    Lipid profile in 08/2010:190, 121, 48, 118; normal CBC and CMet  . Hypothyroidism   . Obesity    Past Surgical History:  Procedure Laterality Date  . APPENDECTOMY    . CHOLECYSTECTOMY    . COLONOSCOPY N/A 04/01/2014   RMR: Melanosis coli. colonic polyps removed as described above.   Marland Kitchen LEFT HEART CATH AND CORONARY ANGIOGRAPHY N/A 10/07/2017   Procedure: LEFT HEART CATH AND CORONARY ANGIOGRAPHY;  Surgeon: Troy Sine, MD;  Location: [facility];  Service: Cardiovascular;   Laterality: N/A;  . THYROIDECTOMY, PARTIAL     Current Outpatient Medications on File Prior to Visit  Medication Sig Dispense Refill  . aspirin EC 81 MG EC tablet Take 1 tablet (81 mg total) by mouth daily. 30 tablet 0  . Blood Glucose Monitoring Suppl (BLOOD GLUCOSE SYSTEM PAK) KIT Please dispense based on patient and insurance preference. Use as directed to monitor FSBS 2x daily. Dx: E11.9. 1 each 1  . cyanocobalamin (,VITAMIN B-12,) 1000 MCG/ML injection Inject 1 mL (1,000 mcg total) into the muscle every 30 (thirty) days. 1 mL 0  . gabapentin (NEURONTIN) 300 MG capsule TAKE ONE CAPSULE BY MOUTH EVERY NIGHT AT BEDTIME 30 capsule 3  . Garlic (GARLIQUE) 308 MG TBEC Take 1 tablet by mouth every evening.    Marland Kitchen glipiZIDE (GLUCOTROL) 5 MG tablet Take 1 tablet (5 mg total) by mouth 2 (two) times daily. 180 tablet 3  . Glucose Blood (BLOOD GLUCOSE TEST STRIPS) STRP Please dispense based on patient and insurance preference. Use as directed to monitor FSBS 2x daily. Dx: E11.9. 100 each 11  . hydrochlorothiazide (MICROZIDE) 12.5 MG capsule Take 1 capsule (12.5 mg total) by mouth daily. 90 capsule 3  . Lancets MISC Please dispense based on patient and insurance preference. Use as directed to monitor FSBS 2x daily. Dx: E11.9.  100 each 11  . losartan (COZAAR) 100 MG tablet Take 1 tablet (100 mg total) by mouth daily. 90 tablet 3  . meclizine (ANTIVERT) 25 MG tablet Take 25 mg by mouth 3 (three) times daily as needed for dizziness.    . metFORMIN (GLUCOPHAGE) 1000 MG tablet TAKE ONE TABLET BY MOUTH TWICE A DAY 60 tablet 3  . metoprolol tartrate (LOPRESSOR) 25 MG tablet Take 0.5 tablets (12.5 mg total) by mouth 2 (two) times daily. 90 tablet 3  . naproxen (NAPROSYN) 500 MG tablet TAKE ONE TABLET BY MOUTH TWICE DAILY AS NEEDED 60 tablet 0  . Omega-3 Fatty Acids (OMEGA-3 FISH OIL PO) Take 1 capsule by mouth every evening.    Marland Kitchen omeprazole (PRILOSEC) 40 MG capsule Take 1 capsule (40 mg total) by mouth daily. 90  capsule 3  . simvastatin (ZOCOR) 40 MG tablet TAKE ONE TABLET (40MG TOTAL) BY MOUTH BEDTIME 90 tablet 3  . SYNTHROID 150 MCG tablet TAKE ONE (1) TABLET BY MOUTH EVERY DAY 30 tablet 3  . tolterodine (DETROL LA) 4 MG 24 hr capsule TAKE ONE CAPSULE BY MOUTH DAILY 30 capsule 3  . Vitamin D, Ergocalciferol, (DRISDOL) 1.25 MG (50000 UT) CAPS capsule Take 1 capsule (50,000 Units total) by mouth every 7 (seven) days. x12 weeks. 12 capsule 0   Current Facility-Administered Medications on File Prior to Visit  Medication Dose Route Frequency Provider Last Rate Last Admin  . cyanocobalamin ((VITAMIN B-12)) injection 1,000 mcg  1,000 mcg Intramuscular Q30 days Alycia Rossetti, MD   1,000 mcg at 08/16/18 1023   No Known Allergies Social History   Socioeconomic History  . Marital status: Widowed    Spouse name: Not on file  . Number of children: Not on file  . Years of education: Not on file  . Highest education level: Not on file  Occupational History    Employer: RETIRED  Tobacco Use  . Smoking status: Never Smoker  . Smokeless tobacco: Never Used  Substance and Sexual Activity  . Alcohol use: Never  . Drug use: Never  . Sexual activity: Not Currently  Other Topics Concern  . Not on file  Social History Narrative  . Not on file   Social Determinants of Health   Financial Resource Strain:   . Difficulty of Paying Living Expenses: Not on file  Food Insecurity:   . Worried About Charity fundraiser in the Last Year: Not on file  . Ran Out of Food in the Last Year: Not on file  Transportation Needs:   . Lack of Transportation (Medical): Not on file  . Lack of Transportation (Non-Medical): Not on file  Physical Activity:   . Days of Exercise per Week: Not on file  . Minutes of Exercise per Session: Not on file  Stress:   . Feeling of Stress : Not on file  Social Connections:   . Frequency of Communication with Friends and Family: Not on file  . Frequency of Social Gatherings with  Friends and Family: Not on file  . Attends Religious Services: Not on file  . Active Member of Clubs or Organizations: Not on file  . Attends Archivist Meetings: Not on file  . Marital Status: Not on file  Intimate Partner Violence:   . Fear of Current or Ex-Partner: Not on file  . Emotionally Abused: Not on file  . Physically Abused: Not on file  . Sexually Abused: Not on file     Review of  Systems     Objective:   Physical Exam Vitals reviewed.  Constitutional:      Appearance: She is normal weight.  Cardiovascular:     Rate and Rhythm: Normal rate and regular rhythm.     Heart sounds: Normal heart sounds.  Pulmonary:     Effort: Pulmonary effort is normal. No respiratory distress.     Breath sounds: Normal breath sounds. No wheezing, rhonchi or rales.  Abdominal:     General: Abdomen is flat. Bowel sounds are normal.     Palpations: Abdomen is soft.     Tenderness: There is no right CVA tenderness or left CVA tenderness.  Neurological:     Mental Status: She is alert.     Gait: Gait abnormal (Patient is confined to a wheelchair due to weakness).           Assessment & Plan:  Gross hematuria - Plan: sulfamethoxazole-trimethoprim (BACTRIM DS) 800-160 MG tablet, Urinalysis, Routine w reflex microscopic, Urine Culture  Type 2 diabetes mellitus with diabetic autonomic neuropathy, without long-term current use of insulin (HCC) - Plan: Hemoglobin A1c, CBC with Differential/Platelet, COMPLETE METABOLIC PANEL WITH GFR, Lipid panel  Patient appears to have a urinary tract infection however interpretation of urinalysis is difficult due to Azo.  Therefore I will send a urine culture and meanwhile treat the patient with Bactrim double strength tablets twice daily for 7 days.  Patient has appointment next week to see her PCP.  Would recommend rechecking urinalysis at that time.  If gross hematuria continues, would recommend CT scan renal stone protocol to evaluate for  other potential causes of gross hematuria.  Patient may also require cystoscopy.  Also will obtain fasting lab work today including an A1c, CBC, CMP, and fasting lipid panel so the patient will have this when she comes to her appointment with her PCP next week.

## 2019-04-14 LAB — HEMOGLOBIN A1C
Hgb A1c MFr Bld: 7.7 % of total Hgb — ABNORMAL HIGH (ref <5.7)
Mean Plasma Glucose: 174 (calc)
eAG (mmol/L): 9.7 (calc)

## 2019-04-14 LAB — COMPLETE METABOLIC PANEL WITH GFR
AG Ratio: 1.1 (calc) (ref 1.0–2.5)
ALT: 9 U/L (ref 6–29)
AST: 28 U/L (ref 10–35)
Albumin: 3.9 g/dL (ref 3.6–5.1)
Alkaline phosphatase (APISO): 61 U/L (ref 37–153)
BUN/Creatinine Ratio: 24 (calc) — ABNORMAL HIGH (ref 6–22)
BUN: 30 mg/dL — ABNORMAL HIGH (ref 7–25)
CO2: 19 mmol/L — ABNORMAL LOW (ref 20–32)
Calcium: 9.5 mg/dL (ref 8.6–10.4)
Chloride: 107 mmol/L (ref 98–110)
Creat: 1.26 mg/dL — ABNORMAL HIGH (ref 0.60–0.93)
GFR, Est African American: 48 mL/min/{1.73_m2} — ABNORMAL LOW (ref >=60)
GFR, Est Non African American: 41 mL/min/{1.73_m2} — ABNORMAL LOW (ref >=60)
Globulin: 3.7 g/dL (calc) (ref 1.9–3.7)
Glucose, Bld: 115 mg/dL — ABNORMAL HIGH (ref 65–99)
Potassium: 4.9 mmol/L (ref 3.5–5.3)
Sodium: 140 mmol/L (ref 135–146)
Total Bilirubin: 0.6 mg/dL (ref 0.2–1.2)
Total Protein: 7.6 g/dL (ref 6.1–8.1)

## 2019-04-14 LAB — CBC WITH DIFFERENTIAL/PLATELET
Absolute Monocytes: 726 cells/uL (ref 200–950)
Basophils Absolute: 61 cells/uL (ref 0–200)
Basophils Relative: 0.5 %
Eosinophils Absolute: 762 cells/uL — ABNORMAL HIGH (ref 15–500)
Eosinophils Relative: 6.3 %
HCT: 25.5 % — ABNORMAL LOW (ref 35.0–45.0)
Hemoglobin: 8 g/dL — ABNORMAL LOW (ref 11.7–15.5)
Lymphs Abs: 4489 cells/uL — ABNORMAL HIGH (ref 850–3900)
MCH: 27.8 pg (ref 27.0–33.0)
MCHC: 31.4 g/dL — ABNORMAL LOW (ref 32.0–36.0)
MCV: 88.5 fL (ref 80.0–100.0)
MPV: 10.7 fL (ref 7.5–12.5)
Monocytes Relative: 6 %
Neutro Abs: 6062 cells/uL (ref 1500–7800)
Neutrophils Relative %: 50.1 %
Platelets: 269 10*3/uL (ref 140–400)
RBC: 2.88 10*6/uL — ABNORMAL LOW (ref 3.80–5.10)
RDW: 18.1 % — ABNORMAL HIGH (ref 11.0–15.0)
Total Lymphocyte: 37.1 %
WBC: 12.1 10*3/uL — ABNORMAL HIGH (ref 3.8–10.8)

## 2019-04-14 LAB — LIPID PANEL
Cholesterol: 162 mg/dL (ref <200)
HDL: 40 mg/dL — ABNORMAL LOW (ref >=50)
LDL Cholesterol (Calc): 97 mg/dL (calc)
Non-HDL Cholesterol (Calc): 122 mg/dL (calc) (ref <130)
Total CHOL/HDL Ratio: 4.1 (calc) (ref <5.0)
Triglycerides: 157 mg/dL — ABNORMAL HIGH (ref <150)

## 2019-04-15 LAB — URINE CULTURE
MICRO NUMBER:: 10219706
SPECIMEN QUALITY:: ADEQUATE

## 2019-04-17 ENCOUNTER — Encounter: Payer: Self-pay | Admitting: Family Medicine

## 2019-04-17 ENCOUNTER — Ambulatory Visit (INDEPENDENT_AMBULATORY_CARE_PROVIDER_SITE_OTHER): Payer: Medicare Other | Admitting: Family Medicine

## 2019-04-17 ENCOUNTER — Other Ambulatory Visit: Payer: Self-pay

## 2019-04-17 ENCOUNTER — Other Ambulatory Visit: Payer: Self-pay | Admitting: Family Medicine

## 2019-04-17 VITALS — BP 144/78 | HR 88 | Temp 98.5°F | Resp 14

## 2019-04-17 DIAGNOSIS — N1832 Chronic kidney disease, stage 3b: Secondary | ICD-10-CM

## 2019-04-17 DIAGNOSIS — R310 Gross hematuria: Secondary | ICD-10-CM

## 2019-04-17 DIAGNOSIS — I10 Essential (primary) hypertension: Secondary | ICD-10-CM

## 2019-04-17 DIAGNOSIS — M170 Bilateral primary osteoarthritis of knee: Secondary | ICD-10-CM | POA: Diagnosis not present

## 2019-04-17 DIAGNOSIS — E559 Vitamin D deficiency, unspecified: Secondary | ICD-10-CM

## 2019-04-17 DIAGNOSIS — I2510 Atherosclerotic heart disease of native coronary artery without angina pectoris: Secondary | ICD-10-CM | POA: Diagnosis not present

## 2019-04-17 DIAGNOSIS — E538 Deficiency of other specified B group vitamins: Secondary | ICD-10-CM

## 2019-04-17 DIAGNOSIS — R269 Unspecified abnormalities of gait and mobility: Secondary | ICD-10-CM

## 2019-04-17 DIAGNOSIS — R2681 Unsteadiness on feet: Secondary | ICD-10-CM | POA: Diagnosis not present

## 2019-04-17 DIAGNOSIS — E039 Hypothyroidism, unspecified: Secondary | ICD-10-CM | POA: Diagnosis not present

## 2019-04-17 DIAGNOSIS — E1143 Type 2 diabetes mellitus with diabetic autonomic (poly)neuropathy: Secondary | ICD-10-CM

## 2019-04-17 MED ORDER — TRAMADOL HCL 50 MG PO TABS: 50.0000 mg | ORAL_TABLET | Freq: Two times a day (BID) | ORAL | 1 refills | Status: AC | PRN

## 2019-04-17 NOTE — Progress Notes (Signed)
Subjective:    Patient ID: Kim Mills, female    DOB: Feb 02, 1941, 79 y.o.   MRN: [id number]  Patient presents for Weakness (states that she is very weak and tired- can't hardly walk)  Patient here with her daughter.  She has had increasing problems with her gait she is unable to bear weight or walk long distances.  She has no significant osteoarthritis of the knees needs knee replacement with a set but has surgical intervention.  Her orthopedics is tried multiple modalities and injections with minimal improvement.  They would like to get her power scooter from a local realtor to help with mobility within the home as well as in the community.  Diabetes mellitus she has not been checking her blood sugars regularly.  She is taking her medication.  She is on Metformin at 1000 mg twice a day and glipizide 5 mg twice a day. She had recent labs done, her A1c returned at  7.7%. She admits to eating more sweets and carbs recently  She was seen by my partner last week, with blood in the urine, UC came back negative, she was started on bactrim, next step CT of abd/renal, which she agrees to today Urine still dark, but no gross blood, no abd pain or flank pain  Hypothyroidism- due for TSH  B12 def- has not hadi njection in 1 year due to COVID-19 pandemic    She has been taking NSAIDS  Review Of Systems:  GEN- denies fatigue, fever, weight loss,weakness, recent illness HEENT- denies eye drainage, change in vision, nasal discharge, CVS- denies chest pain, palpitations RESP- denies SOB, cough, wheeze ABD- denies N/V, change in stools, abd pain GU- denies dysuria, hematuria, dribbling, incontinence MSK- + joint pain, muscle aches, injury Neuro- denies headache, dizziness, syncope, seizure activity       Objective:    BP (!) 144/78   Pulse 88   Temp 98.5 F (36.9 C) (Temporal)   Resp 14   SpO2 96%  GEN- NAD, alert and oriented x3,sitting in wheelchair  HEENT- PERRL, EOMI, non  injected sclera, pink conjunctiva,  Neck- Supple, no thyromegaly CVS- RRR, no murmur RESP-CTAB ABD-NABS,soft,NT,ND MSK- decreased ROM Upper and Lower ext, decreased strength, crepitus bilat knee, no effusion, fair ROM knee Unable to stand  EXT- No edema Pulses- Radial 2+ DP- diminished      Assessment & Plan:      Problem List Items Addressed This Visit      Unprioritized   B12 deficiency   Relevant Orders   Vitamin B12 (Completed)   CAD (coronary artery disease)    On statin drug      CKD (chronic kidney disease) stage 3, GFR 30-59 ml/min    D/C NSAIDS       Diabetic neuropathy (HCC)    Continue gabapentin       Gait disorder   Relevant Orders   Ambulatory referral to Home Health   Hypertension    Bp at her baseline, no changes to meds       Hypothyroidism - Primary   Relevant Orders   TSH (Completed)   T4, free (Completed)   OA (osteoarthritis) of knee    Severe OA knees , not a surgical candidate Use of motorized scooter will allow her more mobility Will also schedule PT to work on gait and balance within her home D/C NSAIDS, ULTRAM given       Relevant Medications   traMADol (ULTRAM) 50 MG tablet   Other  Relevant Orders   Ambulatory referral to Home Health   Type 2 diabetes mellitus ([place])    Fair control, no change to meds Work on dietary changes       Relevant Orders   Basic metabolic panel (Completed)   Vitamin D deficiency   Relevant Orders   Vitamin D, 25-hydroxy (Completed)    Other Visit Diagnoses    Gross hematuria       Unclear cause, no infection, may have obstruction or stone, or malignancy, obtain CT, recheck labs, may need nephrology referral   Gait instability          Note: This dictation was prepared with Dragon dictation along with smaller phrase technology. Any transcriptional errors that result from this process are unintentional.

## 2019-04-17 NOTE — Patient Instructions (Addendum)
Power Scooter - [facility]  CT scan to be done  We will call with lab results  Ultram for pain as needed F/U 4 months

## 2019-04-18 ENCOUNTER — Encounter: Payer: Self-pay | Admitting: Family Medicine

## 2019-04-18 DIAGNOSIS — N1831 Chronic kidney disease, stage 3a: Secondary | ICD-10-CM | POA: Insufficient documentation

## 2019-04-18 DIAGNOSIS — N183 Chronic kidney disease, stage 3 unspecified: Secondary | ICD-10-CM | POA: Insufficient documentation

## 2019-04-18 LAB — VITAMIN D 25 HYDROXY (VIT D DEFICIENCY, FRACTURES): Vit D, 25-Hydroxy: 20 ng/mL — ABNORMAL LOW (ref 30–100)

## 2019-04-18 LAB — VITAMIN B12: Vitamin B-12: 263 pg/mL (ref 200–1100)

## 2019-04-18 LAB — BASIC METABOLIC PANEL
BUN/Creatinine Ratio: 15 (calc) (ref 6–22)
BUN: 29 mg/dL — ABNORMAL HIGH (ref 7–25)
CO2: 24 mmol/L (ref 20–32)
Calcium: 9.2 mg/dL (ref 8.6–10.4)
Chloride: 106 mmol/L (ref 98–110)
Creat: 1.94 mg/dL — ABNORMAL HIGH (ref 0.60–0.93)
Glucose, Bld: 92 mg/dL (ref 65–99)
Potassium: 4.5 mmol/L (ref 3.5–5.3)
Sodium: 139 mmol/L (ref 135–146)

## 2019-04-18 LAB — T4, FREE: Free T4: 1.4 ng/dL (ref 0.8–1.8)

## 2019-04-18 LAB — EXTRA LAV TOP TUBE

## 2019-04-18 LAB — TSH: TSH: 1.56 mIU/L (ref 0.40–4.50)

## 2019-04-18 NOTE — Assessment & Plan Note (Signed)
D/C NSAIDS

## 2019-04-18 NOTE — Assessment & Plan Note (Addendum)
Severe OA knees , not a surgical candidate Use of motorized scooter will allow her more mobility Will also schedule PT to work on gait and balance within her home D/C NSAIDS, ULTRAM given

## 2019-04-18 NOTE — Assessment & Plan Note (Signed)
Fair control, no change to meds Work on dietary changes

## 2019-04-18 NOTE — Assessment & Plan Note (Signed)
On statin drug 

## 2019-04-18 NOTE — Assessment & Plan Note (Signed)
Bp at her baseline, no changes to meds

## 2019-04-18 NOTE — Assessment & Plan Note (Signed)
Continue gabapentin.

## 2019-04-19 ENCOUNTER — Other Ambulatory Visit: Payer: Self-pay | Admitting: Family Medicine

## 2019-04-19 DIAGNOSIS — R310 Gross hematuria: Secondary | ICD-10-CM

## 2019-04-20 ENCOUNTER — Ambulatory Visit (HOSPITAL_COMMUNITY): Payer: Medicare Other

## 2019-04-20 ENCOUNTER — Other Ambulatory Visit: Payer: Self-pay | Admitting: *Deleted

## 2019-04-20 DIAGNOSIS — N179 Acute kidney failure, unspecified: Secondary | ICD-10-CM

## 2019-04-20 MED ORDER — AMLODIPINE BESYLATE 10 MG PO TABS: 10.0000 mg | ORAL_TABLET | Freq: Every day | ORAL | 3 refills | Status: DC

## 2019-04-23 ENCOUNTER — Telehealth: Payer: Self-pay | Admitting: *Deleted

## 2019-04-23 DIAGNOSIS — E1122 Type 2 diabetes mellitus with diabetic chronic kidney disease: Secondary | ICD-10-CM | POA: Diagnosis not present

## 2019-04-23 DIAGNOSIS — Z7984 Long term (current) use of oral hypoglycemic drugs: Secondary | ICD-10-CM | POA: Diagnosis not present

## 2019-04-23 DIAGNOSIS — E538 Deficiency of other specified B group vitamins: Secondary | ICD-10-CM | POA: Diagnosis not present

## 2019-04-23 DIAGNOSIS — E1140 Type 2 diabetes mellitus with diabetic neuropathy, unspecified: Secondary | ICD-10-CM | POA: Diagnosis not present

## 2019-04-23 DIAGNOSIS — E559 Vitamin D deficiency, unspecified: Secondary | ICD-10-CM | POA: Diagnosis not present

## 2019-04-23 DIAGNOSIS — I2510 Atherosclerotic heart disease of native coronary artery without angina pectoris: Secondary | ICD-10-CM | POA: Diagnosis not present

## 2019-04-23 DIAGNOSIS — E039 Hypothyroidism, unspecified: Secondary | ICD-10-CM | POA: Diagnosis not present

## 2019-04-23 DIAGNOSIS — N1832 Chronic kidney disease, stage 3b: Secondary | ICD-10-CM | POA: Diagnosis not present

## 2019-04-23 DIAGNOSIS — M170 Bilateral primary osteoarthritis of knee: Secondary | ICD-10-CM | POA: Diagnosis not present

## 2019-04-23 DIAGNOSIS — Z9181 History of falling: Secondary | ICD-10-CM | POA: Diagnosis not present

## 2019-04-23 DIAGNOSIS — I129 Hypertensive chronic kidney disease with stage 1 through stage 4 chronic kidney disease, or unspecified chronic kidney disease: Secondary | ICD-10-CM | POA: Diagnosis not present

## 2019-04-23 MED ORDER — METOPROLOL TARTRATE 25 MG PO TABS: 12.5000 mg | ORAL_TABLET | Freq: Two times a day (BID) | ORAL | 3 refills | Status: DC

## 2019-04-23 NOTE — Telephone Encounter (Signed)
Noted, no change to meds at this time Pt has been stable no myalgia's

## 2019-04-23 NOTE — Telephone Encounter (Signed)
Received call from [place], [facility] PT with [facility]. [phone]~ telephone.   Multiple issues left on VM.   1. Requested order for PT services 2x weekly x7 weeks, then 1x weekly x1 week. No plan of care left. Call placed to [facility] PT to inquire. [place].  2. Requires refill on metoprolol. Prescription sent to pharmacy.   3. Drug to drug interaction noted between simvastatin and amlodipine: Potential for serious interaction- amlodipine oral increases levels of Zocor oral. Limit simvastatin oral to 20mg  PO QD.  4.  Drug to drug interaction noted between glipizide and HCTZ: Interaction is unlikely, minor, or nonsignificant- Thiazide diuretics, in doses over 50mg /day, and similar drugs increase glucose levels, reducing the effect of drugs used for treating diabetes.

## 2019-04-24 ENCOUNTER — Telehealth: Payer: Self-pay | Admitting: *Deleted

## 2019-04-24 ENCOUNTER — Other Ambulatory Visit: Payer: Self-pay | Admitting: Family Medicine

## 2019-04-24 MED ORDER — GABAPENTIN 300 MG PO CAPS: 300.0000 mg | ORAL_CAPSULE | Freq: Every day | ORAL | 3 refills | Status: DC

## 2019-04-24 NOTE — Telephone Encounter (Signed)
Call placed to [place], [place].

## 2019-04-24 NOTE — Telephone Encounter (Signed)
Received return call from [place], [facility] PT.   Reports that plan of care included bed mobility, transfers and gait.   VO given.

## 2019-04-24 NOTE — Telephone Encounter (Signed)
Received call from patient daughter Iris.   Reports that patient is having some swelling to B ankles. Inquired as to if she should resume fluid pill. Advised that HCTZ is fluid pill. Advised to elevated feet. If swelling does not improve, contact office to let us know.   Also reports that patient is voicing C/O pain in B feet. On review of medications, she is not currently taking Gabapentin. Advised to resume Gabapentin and to inform office if B foot pain is not improved.

## 2019-04-25 ENCOUNTER — Other Ambulatory Visit: Payer: Self-pay

## 2019-04-25 ENCOUNTER — Emergency Department (HOSPITAL_COMMUNITY): Payer: Medicare Other

## 2019-04-25 ENCOUNTER — Encounter (HOSPITAL_COMMUNITY): Payer: Self-pay

## 2019-04-25 ENCOUNTER — Emergency Department (HOSPITAL_COMMUNITY)
Admission: EM | Admit: 2019-04-25 | Discharge: 2019-04-25 | Disposition: A | Discharge status: Home / Self Care | Payer: Medicare Other | Attending: Emergency Medicine | Admitting: Emergency Medicine

## 2019-04-25 DIAGNOSIS — R52 Pain, unspecified: Secondary | ICD-10-CM | POA: Diagnosis not present

## 2019-04-25 DIAGNOSIS — Z79899 Other long term (current) drug therapy: Secondary | ICD-10-CM | POA: Insufficient documentation

## 2019-04-25 DIAGNOSIS — E1140 Type 2 diabetes mellitus with diabetic neuropathy, unspecified: Secondary | ICD-10-CM | POA: Insufficient documentation

## 2019-04-25 DIAGNOSIS — I471 Supraventricular tachycardia: Secondary | ICD-10-CM | POA: Insufficient documentation

## 2019-04-25 DIAGNOSIS — E1122 Type 2 diabetes mellitus with diabetic chronic kidney disease: Secondary | ICD-10-CM | POA: Diagnosis not present

## 2019-04-25 DIAGNOSIS — I2510 Atherosclerotic heart disease of native coronary artery without angina pectoris: Secondary | ICD-10-CM | POA: Diagnosis not present

## 2019-04-25 DIAGNOSIS — G8929 Other chronic pain: Secondary | ICD-10-CM | POA: Insufficient documentation

## 2019-04-25 DIAGNOSIS — M25561 Pain in right knee: Secondary | ICD-10-CM

## 2019-04-25 DIAGNOSIS — M25562 Pain in left knee: Secondary | ICD-10-CM | POA: Diagnosis not present

## 2019-04-25 DIAGNOSIS — R079 Chest pain, unspecified: Secondary | ICD-10-CM | POA: Diagnosis not present

## 2019-04-25 DIAGNOSIS — S8991XA Unspecified injury of right lower leg, initial encounter: Secondary | ICD-10-CM | POA: Diagnosis not present

## 2019-04-25 DIAGNOSIS — R000 Tachycardia, unspecified: Secondary | ICD-10-CM | POA: Diagnosis not present

## 2019-04-25 DIAGNOSIS — Z7984 Long term (current) use of oral hypoglycemic drugs: Secondary | ICD-10-CM | POA: Diagnosis not present

## 2019-04-25 DIAGNOSIS — Z7401 Bed confinement status: Secondary | ICD-10-CM | POA: Diagnosis not present

## 2019-04-25 DIAGNOSIS — N183 Chronic kidney disease, stage 3 unspecified: Secondary | ICD-10-CM | POA: Insufficient documentation

## 2019-04-25 DIAGNOSIS — R531 Weakness: Secondary | ICD-10-CM | POA: Diagnosis not present

## 2019-04-25 DIAGNOSIS — S8992XA Unspecified injury of left lower leg, initial encounter: Secondary | ICD-10-CM | POA: Diagnosis not present

## 2019-04-25 DIAGNOSIS — I129 Hypertensive chronic kidney disease with stage 1 through stage 4 chronic kidney disease, or unspecified chronic kidney disease: Secondary | ICD-10-CM | POA: Insufficient documentation

## 2019-04-25 DIAGNOSIS — S79912A Unspecified injury of left hip, initial encounter: Secondary | ICD-10-CM | POA: Diagnosis not present

## 2019-04-25 DIAGNOSIS — E039 Hypothyroidism, unspecified: Secondary | ICD-10-CM | POA: Insufficient documentation

## 2019-04-25 DIAGNOSIS — S79911A Unspecified injury of right hip, initial encounter: Secondary | ICD-10-CM | POA: Diagnosis not present

## 2019-04-25 DIAGNOSIS — W19XXXA Unspecified fall, initial encounter: Secondary | ICD-10-CM | POA: Diagnosis not present

## 2019-04-25 DIAGNOSIS — S299XXA Unspecified injury of thorax, initial encounter: Secondary | ICD-10-CM | POA: Diagnosis not present

## 2019-04-25 DIAGNOSIS — I959 Hypotension, unspecified: Secondary | ICD-10-CM | POA: Diagnosis not present

## 2019-04-25 LAB — COMPREHENSIVE METABOLIC PANEL
ALT: 31 U/L (ref 0–44)
AST: 26 U/L (ref 15–41)
Albumin: 3.7 g/dL (ref 3.5–5.0)
Alkaline Phosphatase: 78 U/L (ref 38–126)
Anion gap: 9 (ref 5–15)
BUN: 39 mg/dL — ABNORMAL HIGH (ref 8–23)
CO2: 24 mmol/L (ref 22–32)
Calcium: 9.5 mg/dL (ref 8.9–10.3)
Chloride: 104 mmol/L (ref 98–111)
Creatinine, Ser: 1.29 mg/dL — ABNORMAL HIGH (ref 0.44–1.00)
GFR calc Af Amer: 47 mL/min — ABNORMAL LOW (ref >60)
GFR calc non Af Amer: 40 mL/min — ABNORMAL LOW (ref >60)
Glucose, Bld: 155 mg/dL — ABNORMAL HIGH (ref 70–99)
Potassium: 4.5 mmol/L (ref 3.5–5.1)
Sodium: 137 mmol/L (ref 135–145)
Total Bilirubin: 0.9 mg/dL (ref 0.3–1.2)
Total Protein: 8 g/dL (ref 6.5–8.1)

## 2019-04-25 LAB — CBC WITH DIFFERENTIAL/PLATELET
Abs Immature Granulocytes: 0.19 10*3/uL — ABNORMAL HIGH (ref 0.00–0.07)
Basophils Absolute: 0.1 10*3/uL (ref 0.0–0.1)
Basophils Relative: 1 %
Eosinophils Absolute: 0.5 10*3/uL (ref 0.0–0.5)
Eosinophils Relative: 5 %
HCT: 26 % — ABNORMAL LOW (ref 36.0–46.0)
Hemoglobin: 7.9 g/dL — ABNORMAL LOW (ref 12.0–15.0)
Immature Granulocytes: 2 %
Lymphocytes Relative: 39 %
Lymphs Abs: 4.2 10*3/uL — ABNORMAL HIGH (ref 0.7–4.0)
MCH: 28.2 pg (ref 26.0–34.0)
MCHC: 30.4 g/dL (ref 30.0–36.0)
MCV: 92.9 fL (ref 80.0–100.0)
Monocytes Absolute: 0.7 10*3/uL (ref 0.1–1.0)
Monocytes Relative: 6 %
Neutro Abs: 5.1 10*3/uL (ref 1.7–7.7)
Neutrophils Relative %: 47 %
Platelets: 203 10*3/uL (ref 150–400)
RBC: 2.8 MIL/uL — ABNORMAL LOW (ref 3.87–5.11)
RDW: 18.8 % — ABNORMAL HIGH (ref 11.5–15.5)
WBC: 10.7 10*3/uL — ABNORMAL HIGH (ref 4.0–10.5)
nRBC: 2.9 % — ABNORMAL HIGH (ref 0.0–0.2)

## 2019-04-25 MED ORDER — NOREPINEPHRINE 4 MG/250ML-% IV SOLN: 4.0000 ug/min | INTRAVENOUS | Status: DC

## 2019-04-25 NOTE — ED Notes (Addendum)
Patient transported to CT/xray 

## 2019-04-25 NOTE — ED Triage Notes (Addendum)
Pt states she fell on her knees and injured them, but cannot remember the date of the fall. Swelling noted bilaterally. Pt HR in triage 160's. EMS did not notify of abnormal vital signs.

## 2019-04-25 NOTE — ED Provider Notes (Signed)
[facility] EMERGENCY DEPARTMENT Provider Note   CSN: [id number] Arrival date & time: 04/25/19  1837     History Chief Complaint  Patient presents with  . Leg Pain    Kim Mills is a 79 y.o. female.  HPI   This patient is a 79 year old female, she has a known history of diabetes, she has acid reflux, hypertension, she has a history of supraventricular tachycardia and a history of diabetic neuropathy.  This patient has presented to the hospital tonight because of a fall, she states that she fell on Sunday when she bent over to pick up a wash rag, she fell to the ground and felt like she hit her knees.  She has had some difficulty ambulating since that time, she does not remember much more from the fall.  The patient denies chest pain coughing or shortness of breath and has had no fevers or chills.  She has felt very weak today and has had difficulty even getting out of bed to use the bedside commode.  She lives with her son but her son works full-time.  The symptoms have been persistent gradually worsening and are not associated with any open wounds.  Past Medical History:  Diagnosis Date  . Arthritis   . Cataract   . Chest pain    Associated with weakness and fatigue  . Diabetes mellitus    A1c of 7.4 in 08/2010  . GERD (gastroesophageal reflux disease)   . Glaucoma   . Hyperlipidemia    Lipid profile in 08/2010:190, 121, 48, 118; normal CBC and CMet  . Hypertension    Lipid profile in 08/2010:190, 121, 48, 118; normal CBC and CMet  . Hypothyroidism   . Obesity     Patient Active Problem List   Diagnosis Date Noted  . CKD (chronic kidney disease) stage 3, GFR 30-59 ml/min 04/18/2019  . Vitamin D deficiency 04/17/2019  . B12 deficiency 04/12/2018  . Anemia 04/12/2018  . Carotid artery disease without cerebral infarction ([place]) 04/12/2018  . Gait disorder 01/11/2018  . Diabetic neuropathy ([place]) 01/10/2018  . Bilateral carotid bruits 11/28/2017  . CAD (coronary artery  disease) 11/23/2017  . Elevated troponin   . SVT (supraventricular tachycardia) ([place]) 10/05/2017  . History of colonic polyps   . Encounter for screening colonoscopy 03/13/2014  . Constipation 03/13/2014  . GERD (gastroesophageal reflux disease) 03/13/2014  . Hyperlipidemia   . Type 2 diabetes mellitus ([place])   . OA (osteoarthritis) of knee   . Hypothyroidism   . Chest pain   . Obesity   . Hypertension     Past Surgical History:  Procedure Laterality Date  . APPENDECTOMY    . CHOLECYSTECTOMY    . COLONOSCOPY N/A 04/01/2014   RMR: Melanosis coli. colonic polyps removed as described above.   Marland Kitchen LEFT HEART CATH AND CORONARY ANGIOGRAPHY N/A 10/07/2017   Procedure: LEFT HEART CATH AND CORONARY ANGIOGRAPHY;  Surgeon: Troy Sine, MD;  Location: [place] [facility];  Service: Cardiovascular;  Laterality: N/A;  . THYROIDECTOMY, PARTIAL       OB History   No obstetric history on file.     Family History  Problem Relation Age of Onset  . Heart block Mother   . Uterine cancer Sister   . Diabetes Sister     Social History   Tobacco Use  . Smoking status: Never Smoker  . Smokeless tobacco: Never Used  Substance Use Topics  . Alcohol use: Never  . Drug use:  Never    Home Medications Prior to Admission medications   Medication Sig Start Date End Date Taking? Authorizing Provider  amLODipine (NORVASC) 10 MG tablet Take 1 tablet (10 mg total) by mouth daily. 04/20/19   Alycia Rossetti, MD  Blood Glucose Monitoring Suppl (BLOOD GLUCOSE SYSTEM PAK) KIT Please dispense based on patient and insurance preference. Use as directed to monitor FSBS 2x daily. Dx: E11.9. 04/12/18   Alycia Rossetti, MD  cyanocobalamin (,VITAMIN B-12,) 1000 MCG/ML injection Inject 1 mL (1,000 mcg total) into the muscle every 30 (thirty) days. 04/20/18   Alycia Rossetti, MD  gabapentin (NEURONTIN) 300 MG capsule Take 1 capsule (300 mg total) by mouth at bedtime. 04/24/19   [place], Modena Nunnery, MD  Garlic  (GARLIQUE) 419 MG TBEC Take 1 tablet by mouth every evening.    [provider]  glipiZIDE (GLUCOTROL) 5 MG tablet Take 1 tablet (5 mg total) by mouth 2 (two) times daily. 12/28/18   Alycia Rossetti, MD  Glucose Blood (BLOOD GLUCOSE TEST STRIPS) STRP Please dispense based on patient and insurance preference. Use as directed to monitor FSBS 2x daily. Dx: E11.9. 04/12/18   Alycia Rossetti, MD  hydrochlorothiazide (MICROZIDE) 12.5 MG capsule Take 1 capsule (12.5 mg total) by mouth daily. 02/20/18   Alycia Rossetti, MD  Lancets MISC Please dispense based on patient and insurance preference. Use as directed to monitor FSBS 2x daily. Dx: E11.9. 04/12/18   Alycia Rossetti, MD  meclizine (ANTIVERT) 25 MG tablet Take 25 mg by mouth 3 (three) times daily as needed for dizziness.    [provider]  metFORMIN (GLUCOPHAGE) 1000 MG tablet TAKE ONE TABLET BY MOUTH TWICE A DAY 12/15/18   [place], Modena Nunnery, MD  metoprolol tartrate (LOPRESSOR) 25 MG tablet Take 0.5 tablets (12.5 mg total) by mouth 2 (two) times daily. 04/23/19   [place], Modena Nunnery, MD  Omega-3 Fatty Acids (OMEGA-3 FISH OIL PO) Take 1 capsule by mouth every evening.    [provider]  omeprazole (PRILOSEC) 40 MG capsule Take 1 capsule (40 mg total) by mouth daily. 01/10/18   Alycia Rossetti, MD  simvastatin (ZOCOR) 40 MG tablet TAKE ONE TABLET (40MG TOTAL) BY MOUTH BEDTIME 02/27/19   [place], Modena Nunnery, MD  SYNTHROID 150 MCG tablet TAKE ONE (1) TABLET BY MOUTH EVERY DAY 01/29/19   Alycia Rossetti, MD  tolterodine (DETROL LA) 4 MG 24 hr capsule TAKE ONE CAPSULE BY MOUTH DAILY 04/24/19   Alycia Rossetti, MD    Allergies    Patient has no known allergies.  Review of Systems   Review of Systems  All other systems reviewed and are negative.   Physical Exam Updated Vital Signs BP 130/64   Pulse 82   Temp 98.2 F (36.8 C)   Resp 16   Wt 87 kg   SpO2 97%   BMI 35.08 kg/m   Physical Exam Vitals and nursing note  reviewed.  Constitutional:      General: She is not in acute distress.    Appearance: She is well-developed.  HENT:     Head: Normocephalic and atraumatic.     Mouth/Throat:     Pharynx: No oropharyngeal exudate.     Comments: Pale mucosa Eyes:     General: No scleral icterus.       Right eye: No discharge.        Left eye: No discharge.     Pupils: Pupils are equal,  round, and reactive to light.     Comments: Pale conjunctive a  Neck:     Thyroid: No thyromegaly.     Vascular: No JVD.  Cardiovascular:     Rate and Rhythm: Regular rhythm. Tachycardia present.     Heart sounds: Normal heart sounds. No murmur. No friction rub. No gallop.      Comments: Regular narrow complex tachycardia with a rate of 160 and very regular, normal pulses at the radial arteries Pulmonary:     Effort: Pulmonary effort is normal. No respiratory distress.     Breath sounds: Normal breath sounds. No wheezing or rales.  Abdominal:     General: Bowel sounds are normal. There is no distension.     Palpations: Abdomen is soft. There is no mass.     Tenderness: There is no abdominal tenderness.  Musculoskeletal:        General: No tenderness. Normal range of motion.     Cervical back: Normal range of motion and neck supple.     Comments: The patient is able to straight leg raise minimally symmetrically, she has no leg length discrepancies, she has bilateral mildly swollen knees, there is no palpable effusion but has mild bony tenderness with range of motion.  There is no redness, there is no asymmetry, there is no obvious asymmetrical deformity.  Lymphadenopathy:     Cervical: No cervical adenopathy.  Skin:    General: Skin is warm and dry.     Findings: No erythema or rash.  Neurological:     Mental Status: She is alert.     Coordination: Coordination normal.  Psychiatric:        Behavior: Behavior normal.     ED Results / Procedures / Treatments   Labs (all labs ordered are listed, but only  abnormal results are displayed) Labs Reviewed  CBC WITH DIFFERENTIAL/PLATELET - Abnormal; Notable for the following components:      Result Value   WBC 10.7 (*)    RBC 2.80 (*)    Hemoglobin 7.9 (*)    HCT 26.0 (*)    RDW 18.8 (*)    nRBC 2.9 (*)    Lymphs Abs 4.2 (*)    Abs Immature Granulocytes 0.19 (*)    All other components within normal limits  COMPREHENSIVE METABOLIC PANEL - Abnormal; Notable for the following components:   Glucose, Bld 155 (*)    BUN 39 (*)    Creatinine, Ser 1.29 (*)    GFR calc non Af Amer 40 (*)    GFR calc Af Amer 47 (*)    All other components within normal limits    EKG EKG Interpretation  Date/Time:  Wednesday April 25 2019 19:00:47 EDT Ventricular Rate:  167 PR Interval:    QRS Duration: 69 QT Interval:  239 QTC Calculation: 399 R Axis:   32 Text Interpretation: Supraventricular tachycardia Repolarization abnormality, prob rate related Baseline wander in lead(s) I aVL V1 Since last tracing Supraventricular tachycardia has developed Confirmed by Noemi Chapel [phone]) on 04/25/2019 7:06:38 PM   Radiology DG Chest Port 1 View  Result Date: 04/25/2019 CLINICAL DATA:  Pain status post fall EXAM: PORTABLE CHEST 1 VIEW COMPARISON:  October 05, 2017 FINDINGS: The heart size and mediastinal contours are within normal limits. Both lungs are clear. The visualized skeletal structures are unremarkable. IMPRESSION: No active disease. Electronically Signed   By: Constance Holster M.D.   On: 04/25/2019 20:15   DG Knee Complete 4 Views Left  Result Date: 04/25/2019 CLINICAL DATA:  Pain status post fall. EXAM: LEFT KNEE - COMPLETE 4+ VIEW COMPARISON:  None. FINDINGS: Moderate tricompartmental degenerative changes are noted of the knee, greatest within the lateral and patellofemoral compartments. There is a trace suprapatellar joint effusion. There is no acute displaced fracture or dislocation. IMPRESSION: 1. No acute displaced fracture or dislocation. 2.  Moderate tricompartmental degenerative changes. Electronically Signed   By: Constance Holster M.D.   On: 04/25/2019 20:14   DG Knee Complete 4 Views Right  Result Date: 04/25/2019 CLINICAL DATA:  Trauma, fall. EXAM: RIGHT KNEE - COMPLETE 4+ VIEW COMPARISON:  None. FINDINGS: No evidence of fracture or dislocations. Tricompartmental peripheral spurring. Tricompartmental joint space narrowing. Small knee joint effusion without lipohemarthrosis. Quadriceps and patellar tendon enthesophytes. Soft tissues are unremarkable. IMPRESSION: Tricompartmental osteoarthritis without acute fracture. Electronically Signed   By: Keith Rake M.D.   On: 04/25/2019 20:15   DG Hips Bilat W or Wo Pelvis 3-4 Views  Result Date: 04/25/2019 CLINICAL DATA:  79 year old female with fall. EXAM: DG HIP (WITH OR WITHOUT PELVIS) 3-4V BILAT COMPARISON:  None. FINDINGS: There is no acute fracture or dislocation. The bones are osteopenic. Mild bilateral hip osteoarthritic changes. Degenerative changes of the spine. Vascular calcification noted. Amorphous calcification over the pelvis may represent calcified fibroids. The soft tissues are unremarkable. IMPRESSION: No acute fracture or dislocation. Electronically Signed   By: Anner Crete M.D.   On: 04/25/2019 20:17    Procedures Procedures (including critical care time)  Medications Ordered in ED Medications  norepinephrine (LEVOPHED) 65m in 2537mpremix infusion (4 mcg/min Intravenous Not Given 04/25/19 2117)    ED Course  I have reviewed the triage vital signs and the nursing notes.  Pertinent labs & imaging results that were available during my care of the patient were reviewed by me and considered in my medical decision making (see chart for details).  Clinical Course as of Apr 25 2222  Wed Apr 25, 2019  2041 This patient's hemoglobin seems to be close to her baseline, she remains in a normal sinus rhythm, normotensive with a normal metabolic panel other than a  creatinine of 1.29 which is similar to prior   [BM]    Clinical Course User Index [BM] MiNoemi ChapelMD   MDM Rules/Calculators/A&P                      This patient presents in supraventricular tachycardia though she cannot tell me that that is what her symptoms relate to.  She was more concerned about the fall, the generalized weakness throughout the day which I have to believe is related to the SVT more than anything else.  That being said the patient does appear pale and her conjunctive a and will need to be evaluated for anemia.  At this time the patient has converted spontaneously while an IV was being placed, she is now in a sinus rhythm with a rate of 90.  Labs pending, will get imaging of the patient's knees and chest, check her hemoglobin  Attempted to call pt's daughter who is not answering  The patient's daughter is now at the bedside, she is agreeable to discharge with normal and unremarkable labs, chronic arthritis in the knees, I have ordered home health, they are appreciative and agreeable to close follow-up in the outpatient setting.   Final Clinical Impression(s) / ED Diagnoses Final diagnoses:  SVT (supraventricular tachycardia) (HCC)  Chronic pain of both knees  Rx / DC Orders ED Discharge Orders         [place]     04/25/19 2223    Face-to-face encounter (required for Medicare/Medicaid patients)    Comments: I Johnna Acosta certify that this patient is under my care and that I, or a nurse practitioner or physician's assistant working with me, had a face-to-face encounter that meets the physician face-to-face encounter requirements with this patient on 04/25/2019. The encounter with the patient was in whole, or in part for the following medical condition(s) which is the primary reason for home health care (List medical condition): Patient is too weak to even get out of bed, chronic pain in her knees   04/25/19 2223           Noemi Chapel,  MD 04/25/19 2225

## 2019-04-25 NOTE — Discharge Instructions (Signed)
Your testing today showed that you have reassuring blood work but you have severe arthritis in her knees which is causing chronic pain.  I have requested that home health services contact you for a home visit to establish what care you may need.  Please get up only with assistance, use a wheelchair to get around.  Seek medical exam for severe or worsening symptoms.  Additionally you did have what we call SVT which is why your heart is racing.  This fixed itself spontaneously without any medication.  If you feel like your heart begins racing again please seek medical exam immediately

## 2019-04-25 NOTE — ED Notes (Signed)
Pt back from CT/xray Patient is resting comfortably.Vital signs stable.

## 2019-04-27 ENCOUNTER — Other Ambulatory Visit (HOSPITAL_COMMUNITY): Payer: Medicare Other

## 2019-04-27 DIAGNOSIS — E1122 Type 2 diabetes mellitus with diabetic chronic kidney disease: Secondary | ICD-10-CM | POA: Diagnosis not present

## 2019-04-27 DIAGNOSIS — I2510 Atherosclerotic heart disease of native coronary artery without angina pectoris: Secondary | ICD-10-CM | POA: Diagnosis not present

## 2019-04-27 DIAGNOSIS — N1832 Chronic kidney disease, stage 3b: Secondary | ICD-10-CM | POA: Diagnosis not present

## 2019-04-27 DIAGNOSIS — E1140 Type 2 diabetes mellitus with diabetic neuropathy, unspecified: Secondary | ICD-10-CM | POA: Diagnosis not present

## 2019-04-27 DIAGNOSIS — M170 Bilateral primary osteoarthritis of knee: Secondary | ICD-10-CM | POA: Diagnosis not present

## 2019-04-27 DIAGNOSIS — I129 Hypertensive chronic kidney disease with stage 1 through stage 4 chronic kidney disease, or unspecified chronic kidney disease: Secondary | ICD-10-CM | POA: Diagnosis not present

## 2019-05-01 DIAGNOSIS — E1140 Type 2 diabetes mellitus with diabetic neuropathy, unspecified: Secondary | ICD-10-CM | POA: Diagnosis not present

## 2019-05-01 DIAGNOSIS — I129 Hypertensive chronic kidney disease with stage 1 through stage 4 chronic kidney disease, or unspecified chronic kidney disease: Secondary | ICD-10-CM | POA: Diagnosis not present

## 2019-05-01 DIAGNOSIS — I2510 Atherosclerotic heart disease of native coronary artery without angina pectoris: Secondary | ICD-10-CM | POA: Diagnosis not present

## 2019-05-01 DIAGNOSIS — M170 Bilateral primary osteoarthritis of knee: Secondary | ICD-10-CM | POA: Diagnosis not present

## 2019-05-01 DIAGNOSIS — N1832 Chronic kidney disease, stage 3b: Secondary | ICD-10-CM | POA: Diagnosis not present

## 2019-05-01 DIAGNOSIS — E1122 Type 2 diabetes mellitus with diabetic chronic kidney disease: Secondary | ICD-10-CM | POA: Diagnosis not present

## 2019-05-02 ENCOUNTER — Ambulatory Visit (HOSPITAL_COMMUNITY): Payer: Medicare Other

## 2019-05-04 DIAGNOSIS — I2510 Atherosclerotic heart disease of native coronary artery without angina pectoris: Secondary | ICD-10-CM | POA: Diagnosis not present

## 2019-05-04 DIAGNOSIS — M170 Bilateral primary osteoarthritis of knee: Secondary | ICD-10-CM | POA: Diagnosis not present

## 2019-05-04 DIAGNOSIS — I129 Hypertensive chronic kidney disease with stage 1 through stage 4 chronic kidney disease, or unspecified chronic kidney disease: Secondary | ICD-10-CM | POA: Diagnosis not present

## 2019-05-04 DIAGNOSIS — N1832 Chronic kidney disease, stage 3b: Secondary | ICD-10-CM | POA: Diagnosis not present

## 2019-05-04 DIAGNOSIS — E1140 Type 2 diabetes mellitus with diabetic neuropathy, unspecified: Secondary | ICD-10-CM | POA: Diagnosis not present

## 2019-05-04 DIAGNOSIS — E1122 Type 2 diabetes mellitus with diabetic chronic kidney disease: Secondary | ICD-10-CM | POA: Diagnosis not present

## 2019-05-08 ENCOUNTER — Telehealth: Payer: Self-pay | Admitting: *Deleted

## 2019-05-08 NOTE — Telephone Encounter (Signed)
Received call from patient daughter Iris.   Reports that patient has CT scheduled for Friday at [facility]. Inquired as to if she can have labs drawn at [facility].   Call placed to [facility]. Was advised that they should be able to see orders in Epic. Advised to have patient check in at front desk and labs can be drawn there.

## 2019-05-09 DIAGNOSIS — E1140 Type 2 diabetes mellitus with diabetic neuropathy, unspecified: Secondary | ICD-10-CM | POA: Diagnosis not present

## 2019-05-09 DIAGNOSIS — N1832 Chronic kidney disease, stage 3b: Secondary | ICD-10-CM | POA: Diagnosis not present

## 2019-05-09 DIAGNOSIS — M170 Bilateral primary osteoarthritis of knee: Secondary | ICD-10-CM | POA: Diagnosis not present

## 2019-05-09 DIAGNOSIS — E1122 Type 2 diabetes mellitus with diabetic chronic kidney disease: Secondary | ICD-10-CM | POA: Diagnosis not present

## 2019-05-09 DIAGNOSIS — I2510 Atherosclerotic heart disease of native coronary artery without angina pectoris: Secondary | ICD-10-CM | POA: Diagnosis not present

## 2019-05-09 DIAGNOSIS — I129 Hypertensive chronic kidney disease with stage 1 through stage 4 chronic kidney disease, or unspecified chronic kidney disease: Secondary | ICD-10-CM | POA: Diagnosis not present

## 2019-05-11 ENCOUNTER — Ambulatory Visit (HOSPITAL_COMMUNITY)
Admission: RE | Admit: 2019-05-11 | Discharge: 2019-05-11 | Disposition: A | Discharge status: Home / Self Care | Payer: Medicare Other | Source: Ambulatory Visit | Attending: Family Medicine | Admitting: Family Medicine

## 2019-05-11 ENCOUNTER — Other Ambulatory Visit: Payer: Self-pay

## 2019-05-11 DIAGNOSIS — R319 Hematuria, unspecified: Secondary | ICD-10-CM | POA: Diagnosis not present

## 2019-05-11 DIAGNOSIS — R310 Gross hematuria: Secondary | ICD-10-CM | POA: Insufficient documentation

## 2019-05-14 ENCOUNTER — Other Ambulatory Visit: Payer: Self-pay | Admitting: *Deleted

## 2019-05-14 DIAGNOSIS — N179 Acute kidney failure, unspecified: Secondary | ICD-10-CM

## 2019-05-14 DIAGNOSIS — R310 Gross hematuria: Secondary | ICD-10-CM

## 2019-05-15 ENCOUNTER — Other Ambulatory Visit (HOSPITAL_COMMUNITY)
Admission: RE | Admit: 2019-05-15 | Discharge: 2019-05-15 | Disposition: A | Discharge status: Home / Self Care | Payer: Medicare Other | Source: Ambulatory Visit | Attending: Family Medicine | Admitting: Family Medicine

## 2019-05-15 ENCOUNTER — Other Ambulatory Visit: Payer: Self-pay

## 2019-05-15 DIAGNOSIS — N179 Acute kidney failure, unspecified: Secondary | ICD-10-CM | POA: Diagnosis not present

## 2019-05-15 LAB — BASIC METABOLIC PANEL
Anion gap: 10 (ref 5–15)
BUN: 22 mg/dL (ref 8–23)
CO2: 26 mmol/L (ref 22–32)
Calcium: 9.4 mg/dL (ref 8.9–10.3)
Chloride: 103 mmol/L (ref 98–111)
Creatinine, Ser: 1.01 mg/dL — ABNORMAL HIGH (ref 0.44–1.00)
GFR calc Af Amer: >60 mL/min (ref >60)
GFR calc non Af Amer: 54 mL/min — ABNORMAL LOW (ref >60)
Glucose, Bld: 171 mg/dL — ABNORMAL HIGH (ref 70–99)
Potassium: 4 mmol/L (ref 3.5–5.1)
Sodium: 139 mmol/L (ref 135–145)

## 2019-05-22 DIAGNOSIS — N1832 Chronic kidney disease, stage 3b: Secondary | ICD-10-CM | POA: Diagnosis not present

## 2019-05-22 DIAGNOSIS — I129 Hypertensive chronic kidney disease with stage 1 through stage 4 chronic kidney disease, or unspecified chronic kidney disease: Secondary | ICD-10-CM | POA: Diagnosis not present

## 2019-05-22 DIAGNOSIS — E1140 Type 2 diabetes mellitus with diabetic neuropathy, unspecified: Secondary | ICD-10-CM | POA: Diagnosis not present

## 2019-05-22 DIAGNOSIS — I2510 Atherosclerotic heart disease of native coronary artery without angina pectoris: Secondary | ICD-10-CM | POA: Diagnosis not present

## 2019-05-22 DIAGNOSIS — M170 Bilateral primary osteoarthritis of knee: Secondary | ICD-10-CM | POA: Diagnosis not present

## 2019-05-22 DIAGNOSIS — E1122 Type 2 diabetes mellitus with diabetic chronic kidney disease: Secondary | ICD-10-CM | POA: Diagnosis not present

## 2019-05-23 DIAGNOSIS — M170 Bilateral primary osteoarthritis of knee: Secondary | ICD-10-CM | POA: Diagnosis not present

## 2019-05-23 DIAGNOSIS — E1122 Type 2 diabetes mellitus with diabetic chronic kidney disease: Secondary | ICD-10-CM | POA: Diagnosis not present

## 2019-05-23 DIAGNOSIS — E559 Vitamin D deficiency, unspecified: Secondary | ICD-10-CM | POA: Diagnosis not present

## 2019-05-23 DIAGNOSIS — E039 Hypothyroidism, unspecified: Secondary | ICD-10-CM | POA: Diagnosis not present

## 2019-05-23 DIAGNOSIS — N1832 Chronic kidney disease, stage 3b: Secondary | ICD-10-CM | POA: Diagnosis not present

## 2019-05-23 DIAGNOSIS — E1140 Type 2 diabetes mellitus with diabetic neuropathy, unspecified: Secondary | ICD-10-CM | POA: Diagnosis not present

## 2019-05-23 DIAGNOSIS — I2510 Atherosclerotic heart disease of native coronary artery without angina pectoris: Secondary | ICD-10-CM | POA: Diagnosis not present

## 2019-05-23 DIAGNOSIS — E538 Deficiency of other specified B group vitamins: Secondary | ICD-10-CM | POA: Diagnosis not present

## 2019-05-23 DIAGNOSIS — Z7984 Long term (current) use of oral hypoglycemic drugs: Secondary | ICD-10-CM | POA: Diagnosis not present

## 2019-05-23 DIAGNOSIS — Z9181 History of falling: Secondary | ICD-10-CM | POA: Diagnosis not present

## 2019-05-23 DIAGNOSIS — I129 Hypertensive chronic kidney disease with stage 1 through stage 4 chronic kidney disease, or unspecified chronic kidney disease: Secondary | ICD-10-CM | POA: Diagnosis not present

## 2019-05-24 ENCOUNTER — Telehealth: Payer: Self-pay | Admitting: *Deleted

## 2019-05-24 NOTE — Telephone Encounter (Signed)
Receive call from [place], [facility] PT with [facility]. [phone]~ telephone.   Reports that patient cancelled PT session today D/T fall. Reports no injuries. Stated that she did not want to be seen.   Requested VO to change PT frequency again due to missed visit.   VO given.

## 2019-05-25 NOTE — Telephone Encounter (Signed)
Noted  

## 2019-05-30 DIAGNOSIS — M170 Bilateral primary osteoarthritis of knee: Secondary | ICD-10-CM | POA: Diagnosis not present

## 2019-05-30 DIAGNOSIS — E1122 Type 2 diabetes mellitus with diabetic chronic kidney disease: Secondary | ICD-10-CM | POA: Diagnosis not present

## 2019-05-30 DIAGNOSIS — I129 Hypertensive chronic kidney disease with stage 1 through stage 4 chronic kidney disease, or unspecified chronic kidney disease: Secondary | ICD-10-CM | POA: Diagnosis not present

## 2019-05-30 DIAGNOSIS — E1140 Type 2 diabetes mellitus with diabetic neuropathy, unspecified: Secondary | ICD-10-CM | POA: Diagnosis not present

## 2019-05-30 DIAGNOSIS — N1832 Chronic kidney disease, stage 3b: Secondary | ICD-10-CM | POA: Diagnosis not present

## 2019-05-30 DIAGNOSIS — I2510 Atherosclerotic heart disease of native coronary artery without angina pectoris: Secondary | ICD-10-CM | POA: Diagnosis not present

## 2019-05-31 ENCOUNTER — Other Ambulatory Visit: Payer: Self-pay | Admitting: Family Medicine

## 2019-06-01 DIAGNOSIS — N1832 Chronic kidney disease, stage 3b: Secondary | ICD-10-CM | POA: Diagnosis not present

## 2019-06-01 DIAGNOSIS — E1140 Type 2 diabetes mellitus with diabetic neuropathy, unspecified: Secondary | ICD-10-CM | POA: Diagnosis not present

## 2019-06-01 DIAGNOSIS — E1122 Type 2 diabetes mellitus with diabetic chronic kidney disease: Secondary | ICD-10-CM | POA: Diagnosis not present

## 2019-06-01 DIAGNOSIS — M170 Bilateral primary osteoarthritis of knee: Secondary | ICD-10-CM | POA: Diagnosis not present

## 2019-06-01 DIAGNOSIS — I2510 Atherosclerotic heart disease of native coronary artery without angina pectoris: Secondary | ICD-10-CM | POA: Diagnosis not present

## 2019-06-01 DIAGNOSIS — I129 Hypertensive chronic kidney disease with stage 1 through stage 4 chronic kidney disease, or unspecified chronic kidney disease: Secondary | ICD-10-CM | POA: Diagnosis not present

## 2019-06-06 DIAGNOSIS — I129 Hypertensive chronic kidney disease with stage 1 through stage 4 chronic kidney disease, or unspecified chronic kidney disease: Secondary | ICD-10-CM | POA: Diagnosis not present

## 2019-06-06 DIAGNOSIS — E1122 Type 2 diabetes mellitus with diabetic chronic kidney disease: Secondary | ICD-10-CM | POA: Diagnosis not present

## 2019-06-06 DIAGNOSIS — E1140 Type 2 diabetes mellitus with diabetic neuropathy, unspecified: Secondary | ICD-10-CM | POA: Diagnosis not present

## 2019-06-06 DIAGNOSIS — M170 Bilateral primary osteoarthritis of knee: Secondary | ICD-10-CM | POA: Diagnosis not present

## 2019-06-06 DIAGNOSIS — N1832 Chronic kidney disease, stage 3b: Secondary | ICD-10-CM | POA: Diagnosis not present

## 2019-06-06 DIAGNOSIS — I2510 Atherosclerotic heart disease of native coronary artery without angina pectoris: Secondary | ICD-10-CM | POA: Diagnosis not present

## 2019-06-08 DIAGNOSIS — E1140 Type 2 diabetes mellitus with diabetic neuropathy, unspecified: Secondary | ICD-10-CM | POA: Diagnosis not present

## 2019-06-08 DIAGNOSIS — I129 Hypertensive chronic kidney disease with stage 1 through stage 4 chronic kidney disease, or unspecified chronic kidney disease: Secondary | ICD-10-CM | POA: Diagnosis not present

## 2019-06-08 DIAGNOSIS — E1122 Type 2 diabetes mellitus with diabetic chronic kidney disease: Secondary | ICD-10-CM | POA: Diagnosis not present

## 2019-06-08 DIAGNOSIS — I2510 Atherosclerotic heart disease of native coronary artery without angina pectoris: Secondary | ICD-10-CM | POA: Diagnosis not present

## 2019-06-08 DIAGNOSIS — M170 Bilateral primary osteoarthritis of knee: Secondary | ICD-10-CM | POA: Diagnosis not present

## 2019-06-08 DIAGNOSIS — N1832 Chronic kidney disease, stage 3b: Secondary | ICD-10-CM | POA: Diagnosis not present

## 2019-06-12 ENCOUNTER — Telehealth: Payer: Self-pay | Admitting: *Deleted

## 2019-06-12 NOTE — Telephone Encounter (Signed)
Received call from [place], [facility] PT with [facility]. [phone]~ telephone.   Requested OV t extend [facility] PT services 1x2 S/P fall on 05/24/2019 for bed mobility, transfers, and gait.   VO given.

## 2019-06-12 NOTE — Telephone Encounter (Signed)
Agree with above 

## 2019-06-13 DIAGNOSIS — N1832 Chronic kidney disease, stage 3b: Secondary | ICD-10-CM | POA: Diagnosis not present

## 2019-06-13 DIAGNOSIS — E1122 Type 2 diabetes mellitus with diabetic chronic kidney disease: Secondary | ICD-10-CM | POA: Diagnosis not present

## 2019-06-13 DIAGNOSIS — E1140 Type 2 diabetes mellitus with diabetic neuropathy, unspecified: Secondary | ICD-10-CM | POA: Diagnosis not present

## 2019-06-13 DIAGNOSIS — I129 Hypertensive chronic kidney disease with stage 1 through stage 4 chronic kidney disease, or unspecified chronic kidney disease: Secondary | ICD-10-CM | POA: Diagnosis not present

## 2019-06-13 DIAGNOSIS — I2510 Atherosclerotic heart disease of native coronary artery without angina pectoris: Secondary | ICD-10-CM | POA: Diagnosis not present

## 2019-06-13 DIAGNOSIS — M170 Bilateral primary osteoarthritis of knee: Secondary | ICD-10-CM | POA: Diagnosis not present

## 2019-06-15 DIAGNOSIS — N1832 Chronic kidney disease, stage 3b: Secondary | ICD-10-CM | POA: Diagnosis not present

## 2019-06-15 DIAGNOSIS — E1140 Type 2 diabetes mellitus with diabetic neuropathy, unspecified: Secondary | ICD-10-CM | POA: Diagnosis not present

## 2019-06-15 DIAGNOSIS — M170 Bilateral primary osteoarthritis of knee: Secondary | ICD-10-CM | POA: Diagnosis not present

## 2019-06-15 DIAGNOSIS — I129 Hypertensive chronic kidney disease with stage 1 through stage 4 chronic kidney disease, or unspecified chronic kidney disease: Secondary | ICD-10-CM | POA: Diagnosis not present

## 2019-06-15 DIAGNOSIS — I2510 Atherosclerotic heart disease of native coronary artery without angina pectoris: Secondary | ICD-10-CM | POA: Diagnosis not present

## 2019-06-15 DIAGNOSIS — E1122 Type 2 diabetes mellitus with diabetic chronic kidney disease: Secondary | ICD-10-CM | POA: Diagnosis not present

## 2019-06-18 DIAGNOSIS — E1122 Type 2 diabetes mellitus with diabetic chronic kidney disease: Secondary | ICD-10-CM | POA: Diagnosis not present

## 2019-06-18 DIAGNOSIS — E1140 Type 2 diabetes mellitus with diabetic neuropathy, unspecified: Secondary | ICD-10-CM | POA: Diagnosis not present

## 2019-06-18 DIAGNOSIS — M170 Bilateral primary osteoarthritis of knee: Secondary | ICD-10-CM | POA: Diagnosis not present

## 2019-06-18 DIAGNOSIS — N1832 Chronic kidney disease, stage 3b: Secondary | ICD-10-CM | POA: Diagnosis not present

## 2019-06-18 DIAGNOSIS — I129 Hypertensive chronic kidney disease with stage 1 through stage 4 chronic kidney disease, or unspecified chronic kidney disease: Secondary | ICD-10-CM | POA: Diagnosis not present

## 2019-06-18 DIAGNOSIS — I2510 Atherosclerotic heart disease of native coronary artery without angina pectoris: Secondary | ICD-10-CM | POA: Diagnosis not present

## 2019-06-19 ENCOUNTER — Encounter: Payer: Self-pay | Admitting: Family Medicine

## 2019-06-19 DIAGNOSIS — E113293 Type 2 diabetes mellitus with mild nonproliferative diabetic retinopathy without macular edema, bilateral: Secondary | ICD-10-CM | POA: Diagnosis not present

## 2019-06-19 DIAGNOSIS — H04123 Dry eye syndrome of bilateral lacrimal glands: Secondary | ICD-10-CM | POA: Diagnosis not present

## 2019-06-19 DIAGNOSIS — H401131 Primary open-angle glaucoma, bilateral, mild stage: Secondary | ICD-10-CM | POA: Diagnosis not present

## 2019-06-19 DIAGNOSIS — H524 Presbyopia: Secondary | ICD-10-CM | POA: Diagnosis not present

## 2019-06-20 ENCOUNTER — Telehealth: Payer: Self-pay | Admitting: *Deleted

## 2019-06-20 DIAGNOSIS — N1832 Chronic kidney disease, stage 3b: Secondary | ICD-10-CM | POA: Diagnosis not present

## 2019-06-20 DIAGNOSIS — M170 Bilateral primary osteoarthritis of knee: Secondary | ICD-10-CM | POA: Diagnosis not present

## 2019-06-20 DIAGNOSIS — I2510 Atherosclerotic heart disease of native coronary artery without angina pectoris: Secondary | ICD-10-CM | POA: Diagnosis not present

## 2019-06-20 DIAGNOSIS — E1122 Type 2 diabetes mellitus with diabetic chronic kidney disease: Secondary | ICD-10-CM | POA: Diagnosis not present

## 2019-06-20 DIAGNOSIS — E1140 Type 2 diabetes mellitus with diabetic neuropathy, unspecified: Secondary | ICD-10-CM | POA: Diagnosis not present

## 2019-06-20 DIAGNOSIS — I129 Hypertensive chronic kidney disease with stage 1 through stage 4 chronic kidney disease, or unspecified chronic kidney disease: Secondary | ICD-10-CM | POA: Diagnosis not present

## 2019-06-20 NOTE — Telephone Encounter (Signed)
Agree with below. Addendum:    I believe that she has limits with moving and including, toileting, bathing, feeding, dressing and grooming. I believe the power wheelchair is needed for pt to be able to perform ADL's in her home

## 2019-06-20 NOTE — Telephone Encounter (Signed)
Received call from [place], [facility] PT with [facility]. [phone]~ telephone.   Requested OV to extend [facility] PT services 2x weekly x4 weeks for bed mobility, transfers, gait, caregiver education and BLE strengthening.   VO given.

## 2019-06-22 DIAGNOSIS — I2510 Atherosclerotic heart disease of native coronary artery without angina pectoris: Secondary | ICD-10-CM | POA: Diagnosis not present

## 2019-06-22 DIAGNOSIS — E039 Hypothyroidism, unspecified: Secondary | ICD-10-CM | POA: Diagnosis not present

## 2019-06-22 DIAGNOSIS — N1832 Chronic kidney disease, stage 3b: Secondary | ICD-10-CM | POA: Diagnosis not present

## 2019-06-22 DIAGNOSIS — M170 Bilateral primary osteoarthritis of knee: Secondary | ICD-10-CM | POA: Diagnosis not present

## 2019-06-22 DIAGNOSIS — E559 Vitamin D deficiency, unspecified: Secondary | ICD-10-CM | POA: Diagnosis not present

## 2019-06-22 DIAGNOSIS — Z7984 Long term (current) use of oral hypoglycemic drugs: Secondary | ICD-10-CM | POA: Diagnosis not present

## 2019-06-22 DIAGNOSIS — Z9181 History of falling: Secondary | ICD-10-CM | POA: Diagnosis not present

## 2019-06-22 DIAGNOSIS — E538 Deficiency of other specified B group vitamins: Secondary | ICD-10-CM | POA: Diagnosis not present

## 2019-06-22 DIAGNOSIS — E1140 Type 2 diabetes mellitus with diabetic neuropathy, unspecified: Secondary | ICD-10-CM | POA: Diagnosis not present

## 2019-06-22 DIAGNOSIS — I129 Hypertensive chronic kidney disease with stage 1 through stage 4 chronic kidney disease, or unspecified chronic kidney disease: Secondary | ICD-10-CM | POA: Diagnosis not present

## 2019-06-22 DIAGNOSIS — E1122 Type 2 diabetes mellitus with diabetic chronic kidney disease: Secondary | ICD-10-CM | POA: Diagnosis not present

## 2019-06-25 DIAGNOSIS — I2510 Atherosclerotic heart disease of native coronary artery without angina pectoris: Secondary | ICD-10-CM | POA: Diagnosis not present

## 2019-06-25 DIAGNOSIS — I129 Hypertensive chronic kidney disease with stage 1 through stage 4 chronic kidney disease, or unspecified chronic kidney disease: Secondary | ICD-10-CM | POA: Diagnosis not present

## 2019-06-25 DIAGNOSIS — E1122 Type 2 diabetes mellitus with diabetic chronic kidney disease: Secondary | ICD-10-CM | POA: Diagnosis not present

## 2019-06-25 DIAGNOSIS — N1832 Chronic kidney disease, stage 3b: Secondary | ICD-10-CM | POA: Diagnosis not present

## 2019-06-25 DIAGNOSIS — E1140 Type 2 diabetes mellitus with diabetic neuropathy, unspecified: Secondary | ICD-10-CM | POA: Diagnosis not present

## 2019-06-25 DIAGNOSIS — M170 Bilateral primary osteoarthritis of knee: Secondary | ICD-10-CM | POA: Diagnosis not present

## 2019-06-28 ENCOUNTER — Other Ambulatory Visit: Payer: Self-pay | Admitting: Family Medicine

## 2019-06-29 DIAGNOSIS — I129 Hypertensive chronic kidney disease with stage 1 through stage 4 chronic kidney disease, or unspecified chronic kidney disease: Secondary | ICD-10-CM | POA: Diagnosis not present

## 2019-06-29 DIAGNOSIS — I2510 Atherosclerotic heart disease of native coronary artery without angina pectoris: Secondary | ICD-10-CM | POA: Diagnosis not present

## 2019-06-29 DIAGNOSIS — N1832 Chronic kidney disease, stage 3b: Secondary | ICD-10-CM | POA: Diagnosis not present

## 2019-06-29 DIAGNOSIS — E1140 Type 2 diabetes mellitus with diabetic neuropathy, unspecified: Secondary | ICD-10-CM | POA: Diagnosis not present

## 2019-06-29 DIAGNOSIS — E1122 Type 2 diabetes mellitus with diabetic chronic kidney disease: Secondary | ICD-10-CM | POA: Diagnosis not present

## 2019-06-29 DIAGNOSIS — M170 Bilateral primary osteoarthritis of knee: Secondary | ICD-10-CM | POA: Diagnosis not present

## 2019-07-02 ENCOUNTER — Encounter: Payer: Self-pay | Admitting: Urology

## 2019-07-02 ENCOUNTER — Other Ambulatory Visit: Payer: Self-pay

## 2019-07-02 ENCOUNTER — Ambulatory Visit (INDEPENDENT_AMBULATORY_CARE_PROVIDER_SITE_OTHER): Payer: Medicare Other | Admitting: Urology

## 2019-07-02 VITALS — BP 140/78 | HR 87 | Temp 97.3°F | Ht 66.0 in | Wt 235.0 lb

## 2019-07-02 DIAGNOSIS — I2510 Atherosclerotic heart disease of native coronary artery without angina pectoris: Secondary | ICD-10-CM | POA: Diagnosis not present

## 2019-07-02 DIAGNOSIS — R310 Gross hematuria: Secondary | ICD-10-CM | POA: Diagnosis not present

## 2019-07-02 NOTE — Patient Instructions (Signed)
Hematuria, Adult Hematuria is blood in the urine. Blood may be visible in the urine, or it may be identified with a test. This condition can be caused by infections of the bladder, urethra, kidney, or prostate. Other possible causes include:  Kidney stones.  Cancer of the urinary tract.  Too much calcium in the urine.  Conditions that are passed from parent to child (inherited conditions).  Exercise that requires a lot of energy. Infections can usually be treated with medicine, and a kidney stone usually will pass through your urine. If neither of these is the cause of your hematuria, more tests may be needed to identify the cause of your symptoms. It is very important to tell your health care provider about any blood in your urine, even if it is painless or the blood stops without treatment. Blood in the urine, when it happens and then stops and then happens again, can be a symptom of a very serious condition, including cancer. There is no pain in the initial stages of many urinary cancers. Follow these instructions at home: Medicines  Take over-the-counter and prescription medicines only as told by your health care provider.  If you were prescribed an antibiotic medicine, take it as told by your health care provider. Do not stop taking the antibiotic even if you start to feel better. Eating and drinking  Drink enough fluid to keep your urine clear or pale yellow. It is recommended that you drink 3-4 quarts (2.8-3.8 L) a day. If you have been diagnosed with an infection, it is recommended that you drink cranberry juice in addition to large amounts of water.  Avoid caffeine, tea, and carbonated beverages. These tend to irritate the bladder.  Avoid alcohol because it may irritate the prostate (men). General instructions  If you have been diagnosed with a kidney stone, follow your health care provider's instructions about straining your urine to catch the stone.  Empty your bladder  often. Avoid holding urine for long periods of time.  If you are female: ? After a bowel movement, wipe from front to back and use each piece of toilet paper only once. ? Empty your bladder before and after sex.  Pay attention to any changes in your symptoms. Tell your health care provider about any changes or any new symptoms.  It is your responsibility to get your test results. Ask your health care provider, or the department performing the test, when your results will be ready.  Keep all follow-up visits as told by your health care provider. This is important. Contact a health care provider if:  You develop back pain.  You have a fever.  You have nausea or vomiting.  Your symptoms do not improve after 3 days.  Your symptoms get worse. Get help right away if:  You develop severe vomiting and are unable take medicine without vomiting.  You develop severe pain in your back or abdomen even though you are taking medicine.  You pass a large amount of blood in your urine.  You pass blood clots in your urine.  You feel very weak or like you might faint.  You faint. Summary  Hematuria is blood in the urine. It has many possible causes.  It is very important that you tell your health care provider about any blood in your urine, even if it is painless or the blood stops without treatment.  Take over-the-counter and prescription medicines only as told by your health care provider.  Drink enough fluid to keep   your urine clear or pale yellow. This information is not intended to replace advice given to you by your health care provider. Make sure you discuss any questions you have with your health care provider. Document Revised: 06/21/2018 Document Reviewed: 02/28/2016 Elsevier Patient Education  2020 Elsevier Inc.  

## 2019-07-02 NOTE — Progress Notes (Signed)
Urological Symptom Review  Patient is experiencing the following symptoms: Frequent urination Hard to postpone urination Get up at night to urinate Stream starts and stops Trouble starting stream Have to strain to urinate Weak stream   Review of Systems  Gastrointestinal (upper)  : Negative for upper GI symptoms  Gastrointestinal (lower) : Negative for lower GI symptoms  Constitutional : Night Sweats Fatigue  Skin: Negative for skin symptoms  Eyes: Negative for eye symptoms  Ear/Nose/Throat : Negative for Ear/Nose/Throat symptoms  Hematologic/Lymphatic: Negative for Hematologic/Lymphatic symptoms  Cardiovascular : Leg swelling  Respiratory : Cough  Endocrine: Negative for endocrine symptoms  Musculoskeletal: Back pain Joint pain  Neurological: Dizziness  Psychologic: Negative for psychiatric symptoms

## 2019-07-02 NOTE — Progress Notes (Signed)
07/02/2019 4:27 PM   Corliss Skains Bari Mantis [birth year] [id number]  Referring provider: Alycia Rossetti, MD [address] [place],  [place] [address]  Gross hematuria  HPI: [name] is 76JO here for evaluation of gross hematuria. She noted 2 episodes in February and march of this year where she had gross painless hematuria. Urine culture from 04/13/2019 showed mixed growth. She underwent CT on 4/2 which showed gas in the bladder, possibly in the bladder lumen. Per patient prior to this event she has not had a UTI in several years. No dysuria, urinary frequency, urgency. She has nocturnal enuresis and uses 1 pad at night.  She has a hx of OAB and is on detrol LA. No urinary incontinence.    PMH: Past Medical History:  Diagnosis Date  . Arthritis   . Cataract   . Chest pain    Associated with weakness and fatigue  . Diabetes mellitus    A1c of 7.4 in 08/2010  . GERD (gastroesophageal reflux disease)   . Glaucoma   . Hyperlipidemia    Lipid profile in 08/2010:190, 121, 48, 118; normal CBC and CMet  . Hypertension    Lipid profile in 08/2010:190, 121, 48, 118; normal CBC and CMet  . Hypothyroidism   . Hypothyroidism   . Obesity     Surgical History: Past Surgical History:  Procedure Laterality Date  . APPENDECTOMY    . CHOLECYSTECTOMY    . COLONOSCOPY N/A 04/01/2014   RMR: Melanosis coli. colonic polyps removed as described above.   Marland Kitchen LEFT HEART CATH AND CORONARY ANGIOGRAPHY N/A 10/07/2017   Procedure: LEFT HEART CATH AND CORONARY ANGIOGRAPHY;  Surgeon: Troy Sine, MD;  Location: [facility];  Service: Cardiovascular;  Laterality: N/A;  . THYROIDECTOMY, PARTIAL      Home Medications:  Allergies as of 07/02/2019   No Known Allergies     Medication List       Accurate as of Jul 02, 2019  4:27 PM. If you have any questions, ask your nurse or doctor.        amLODipine 10 MG tablet Commonly known as: NORVASC Take 1 tablet (10 mg total) by mouth daily.     Blood Glucose System Pak Kit Please dispense based on patient and insurance preference. Use as directed to monitor FSBS 2x daily. Dx: E11.9.   BLOOD GLUCOSE TEST STRIPS Strp Please dispense based on patient and insurance preference. Use as directed to monitor FSBS 2x daily. Dx: E11.9.   cyanocobalamin 1000 MCG/ML injection Commonly known as: (VITAMIN B-12) Inject 1 mL (1,000 mcg total) into the muscle every 30 (thirty) days.   gabapentin 300 MG capsule Commonly known as: NEURONTIN Take 1 capsule (300 mg total) by mouth at bedtime.   Garlique 400 MG Tbec Generic drug: Garlic Take 1 tablet by mouth every evening.   glipiZIDE 5 MG tablet Commonly known as: GLUCOTROL Take 1 tablet (5 mg total) by mouth 2 (two) times daily.   hydrochlorothiazide 12.5 MG capsule Commonly known as: MICROZIDE TAKE ONE CAPSULE BY MOUTH DAILY   Lancets Misc Please dispense based on patient and insurance preference. Use as directed to monitor FSBS 2x daily. Dx: E11.9.   meclizine 25 MG tablet Commonly known as: ANTIVERT Take 25 mg by mouth 3 (three) times daily as needed for dizziness.   metFORMIN 1000 MG tablet Commonly known as: GLUCOPHAGE TAKE ONE TABLET BY MOUTH TWICE A DAY   metoprolol tartrate 25 MG tablet Commonly  known as: LOPRESSOR Take 0.5 tablets (12.5 mg total) by mouth 2 (two) times daily.   OMEGA-3 FISH OIL PO Take 1 capsule by mouth every evening.   omeprazole 40 MG capsule Commonly known as: PRILOSEC TAKE ONE CAPSULE BY MOUTH DAILY   simvastatin 40 MG tablet Commonly known as: ZOCOR TAKE ONE TABLET (40MG TOTAL) BY MOUTH BEDTIME   Synthroid 150 MCG tablet Generic drug: levothyroxine TAKE ONE (1) TABLET BY MOUTH EVERY DAY   tolterodine 4 MG 24 hr capsule Commonly known as: DETROL LA TAKE ONE CAPSULE BY MOUTH DAILY       Allergies: No Known Allergies  Family History: Family History  Problem Relation Age of Onset  . Heart block Mother   . Uterine cancer Sister    . Diabetes Sister     Social History:  reports that she has never smoked. She has never used smokeless tobacco. She reports that she does not drink alcohol or use drugs.  ROS: All other review of systems were reviewed and are negative except what is noted above in HPI  Physical Exam: BP 140/78   Pulse 87   Temp (!) 97.3 F (36.3 C)   Ht _0  (1.676 m)   Wt 235 lb (106.6 kg)   BMI 37.93 kg/m   Constitutional:  Alert and oriented, No acute distress. HEENT: [place] AT, moist mucus membranes.  Trachea midline, no masses. Cardiovascular: No clubbing, cyanosis, or edema. Respiratory: Normal respiratory effort, no increased work of breathing. GI: Abdomen is soft, nontender, nondistended, no abdominal masses GU: No CVA tenderness.  Lymph: No cervical or inguinal lymphadenopathy. Skin: No rashes, bruises or suspicious lesions. Neurologic: Grossly intact, no focal deficits, moving all 4 extremities. Psychiatric: Normal mood and affect.  Laboratory Data: Lab Results  Component Value Date   WBC 10.7 (H) 04/25/2019   HGB 7.9 (L) 04/25/2019   HCT 26.0 (L) 04/25/2019   MCV 92.9 04/25/2019   PLT 203 04/25/2019    Lab Results  Component Value Date   CREATININE 1.01 (H) 05/15/2019    No results found for: PSA  No results found for: TESTOSTERONE  Lab Results  Component Value Date   HGBA1C 7.7 (H) 04/13/2019    Urinalysis    Component Value Date/Time   COLORURINE ORANGE (A) 04/13/2019 1255   APPEARANCEUR CLOUDY (A) 04/13/2019 1255   LABSPEC 1.010 04/13/2019 1255   PHURINE 5.0 04/13/2019 1255   GLUCOSEU TRACE (A) 04/13/2019 1255   HGBUR 3+ (A) 04/13/2019 1255   BILIRUBINUR NEGATIVE 10/06/2017 0254   KETONESUR TRACE (A) 04/13/2019 1255   PROTEINUR 2+ (A) 04/13/2019 1255   NITRITE POSITIVE (A) 04/13/2019 1255   LEUKOCYTESUR 3+ (A) 04/13/2019 1255    Lab Results  Component Value Date   BACTERIA MANY (A) 04/13/2019    Pertinent Imaging: CT stone study 05/11/2019: Images  reviewed and discussed with the patient. No results found for this or any previous visit. No results found for this or any previous visit. No results found for this or any previous visit. No results found for this or any previous visit. No results found for this or any previous visit. No results found for this or any previous visit. No results found for this or any previous visit. Results for orders placed during the hospital encounter of 05/11/19  CT RENAL STONE STUDY   Narrative CLINICAL DATA:  Hematuria for 1 month  EXAM: CT ABDOMEN AND PELVIS WITHOUT CONTRAST  TECHNIQUE: Multidetector CT imaging of the abdomen and pelvis  was performed following the standard protocol without IV contrast.  COMPARISON:  None.  FINDINGS: Lower chest: No acute pleural or parenchymal lung disease. Minimal subpleural scarring and fibrosis at the lung bases.  Hepatobiliary: No focal liver abnormality is seen. Status post cholecystectomy. No biliary dilatation.  Pancreas: Unremarkable. No pancreatic ductal dilatation or surrounding inflammatory changes.  Spleen: Normal in size without focal abnormality.  Adrenals/Urinary Tract: No urinary tract calculi or obstructive uropathy within either kidney. Small cortical cyst medial aspect lower pole right kidney. Mild bilateral adrenal thickening consistent with hyperplasia.  There is gas in the bladder lumen, please correlate with any recent catheterization.  Stomach/Bowel: No bowel obstruction or ileus. Normal appendix right lower quadrant.  Vascular/Lymphatic: Aortic atherosclerosis. No enlarged abdominal or pelvic lymph nodes.  Reproductive: Calcified uterine fibroids are noted. No adnexal masses.  Other: Small fat containing umbilical hernia. No free fluid or free gas.  Musculoskeletal: No acute or destructive bony lesions. Reconstructed images demonstrate no additional findings.  IMPRESSION: 1. No urinary tract calculi or obstructive  uropathy. 2. Gas in the bladder lumen, please correlate with any recent catheterization. 3. Fat containing umbilical hernia. 4.  Aortic Atherosclerosis (ICD10-I70.0). 5. Calcified uterine fibroids.   Electronically Signed   By: Randa Ngo M.D.   On: 05/11/2019 18:35     Assessment & Plan:    1. Gross hematuria -BMP -CT hematuria -Office cystoscopy   No follow-ups on file.  [name], MD  [facility] [place]

## 2019-07-03 ENCOUNTER — Other Ambulatory Visit: Payer: Self-pay

## 2019-07-03 DIAGNOSIS — N1832 Chronic kidney disease, stage 3b: Secondary | ICD-10-CM | POA: Diagnosis not present

## 2019-07-03 DIAGNOSIS — R310 Gross hematuria: Secondary | ICD-10-CM

## 2019-07-03 DIAGNOSIS — E1140 Type 2 diabetes mellitus with diabetic neuropathy, unspecified: Secondary | ICD-10-CM | POA: Diagnosis not present

## 2019-07-03 DIAGNOSIS — M170 Bilateral primary osteoarthritis of knee: Secondary | ICD-10-CM | POA: Diagnosis not present

## 2019-07-03 DIAGNOSIS — I129 Hypertensive chronic kidney disease with stage 1 through stage 4 chronic kidney disease, or unspecified chronic kidney disease: Secondary | ICD-10-CM | POA: Diagnosis not present

## 2019-07-03 DIAGNOSIS — I2510 Atherosclerotic heart disease of native coronary artery without angina pectoris: Secondary | ICD-10-CM | POA: Diagnosis not present

## 2019-07-03 DIAGNOSIS — E1122 Type 2 diabetes mellitus with diabetic chronic kidney disease: Secondary | ICD-10-CM | POA: Diagnosis not present

## 2019-07-03 NOTE — Progress Notes (Signed)
Pt daughter call to say she was able to obtain urine sample from pt but will be after 5pm before she can get to our office. ua order is in chart and advise daughter to take sample to [facility].

## 2019-07-05 DIAGNOSIS — N1832 Chronic kidney disease, stage 3b: Secondary | ICD-10-CM | POA: Diagnosis not present

## 2019-07-05 DIAGNOSIS — E1140 Type 2 diabetes mellitus with diabetic neuropathy, unspecified: Secondary | ICD-10-CM | POA: Diagnosis not present

## 2019-07-05 DIAGNOSIS — I129 Hypertensive chronic kidney disease with stage 1 through stage 4 chronic kidney disease, or unspecified chronic kidney disease: Secondary | ICD-10-CM | POA: Diagnosis not present

## 2019-07-05 DIAGNOSIS — M170 Bilateral primary osteoarthritis of knee: Secondary | ICD-10-CM | POA: Diagnosis not present

## 2019-07-05 DIAGNOSIS — E1122 Type 2 diabetes mellitus with diabetic chronic kidney disease: Secondary | ICD-10-CM | POA: Diagnosis not present

## 2019-07-05 DIAGNOSIS — I2510 Atherosclerotic heart disease of native coronary artery without angina pectoris: Secondary | ICD-10-CM | POA: Diagnosis not present

## 2019-07-10 ENCOUNTER — Telehealth: Payer: Self-pay | Admitting: *Deleted

## 2019-07-10 DIAGNOSIS — N1832 Chronic kidney disease, stage 3b: Secondary | ICD-10-CM | POA: Diagnosis not present

## 2019-07-10 DIAGNOSIS — I129 Hypertensive chronic kidney disease with stage 1 through stage 4 chronic kidney disease, or unspecified chronic kidney disease: Secondary | ICD-10-CM | POA: Diagnosis not present

## 2019-07-10 DIAGNOSIS — E1122 Type 2 diabetes mellitus with diabetic chronic kidney disease: Secondary | ICD-10-CM | POA: Diagnosis not present

## 2019-07-10 DIAGNOSIS — M170 Bilateral primary osteoarthritis of knee: Secondary | ICD-10-CM | POA: Diagnosis not present

## 2019-07-10 DIAGNOSIS — I2510 Atherosclerotic heart disease of native coronary artery without angina pectoris: Secondary | ICD-10-CM | POA: Diagnosis not present

## 2019-07-10 DIAGNOSIS — E1140 Type 2 diabetes mellitus with diabetic neuropathy, unspecified: Secondary | ICD-10-CM | POA: Diagnosis not present

## 2019-07-10 MED ORDER — TRAMADOL HCL 50 MG PO TABS: 50.0000 mg | ORAL_TABLET | Freq: Two times a day (BID) | ORAL | 0 refills | Status: DC | PRN

## 2019-07-10 NOTE — Telephone Encounter (Signed)
Received call from [place], [facility] PT with [facility] [phone]~ telephone.   Reports that patient is voicing C/O increased pain in B knees with weight bearing.   Requested medication to assist with knee pain.   MD please advise.

## 2019-07-10 NOTE — Telephone Encounter (Signed)
Ultram refilled No NSAIDS

## 2019-07-11 NOTE — Telephone Encounter (Signed)
Call placed to [place], [facility] PT and made aware via VM.

## 2019-07-12 DIAGNOSIS — M170 Bilateral primary osteoarthritis of knee: Secondary | ICD-10-CM | POA: Diagnosis not present

## 2019-07-12 DIAGNOSIS — I2510 Atherosclerotic heart disease of native coronary artery without angina pectoris: Secondary | ICD-10-CM | POA: Diagnosis not present

## 2019-07-12 DIAGNOSIS — N1832 Chronic kidney disease, stage 3b: Secondary | ICD-10-CM | POA: Diagnosis not present

## 2019-07-12 DIAGNOSIS — E1122 Type 2 diabetes mellitus with diabetic chronic kidney disease: Secondary | ICD-10-CM | POA: Diagnosis not present

## 2019-07-12 DIAGNOSIS — I129 Hypertensive chronic kidney disease with stage 1 through stage 4 chronic kidney disease, or unspecified chronic kidney disease: Secondary | ICD-10-CM | POA: Diagnosis not present

## 2019-07-12 DIAGNOSIS — E1140 Type 2 diabetes mellitus with diabetic neuropathy, unspecified: Secondary | ICD-10-CM | POA: Diagnosis not present

## 2019-07-16 DIAGNOSIS — M170 Bilateral primary osteoarthritis of knee: Secondary | ICD-10-CM | POA: Diagnosis not present

## 2019-07-16 DIAGNOSIS — E1122 Type 2 diabetes mellitus with diabetic chronic kidney disease: Secondary | ICD-10-CM | POA: Diagnosis not present

## 2019-07-16 DIAGNOSIS — I2510 Atherosclerotic heart disease of native coronary artery without angina pectoris: Secondary | ICD-10-CM | POA: Diagnosis not present

## 2019-07-16 DIAGNOSIS — E1140 Type 2 diabetes mellitus with diabetic neuropathy, unspecified: Secondary | ICD-10-CM | POA: Diagnosis not present

## 2019-07-16 DIAGNOSIS — N1832 Chronic kidney disease, stage 3b: Secondary | ICD-10-CM | POA: Diagnosis not present

## 2019-07-16 DIAGNOSIS — I129 Hypertensive chronic kidney disease with stage 1 through stage 4 chronic kidney disease, or unspecified chronic kidney disease: Secondary | ICD-10-CM | POA: Diagnosis not present

## 2019-07-22 DIAGNOSIS — M170 Bilateral primary osteoarthritis of knee: Secondary | ICD-10-CM | POA: Diagnosis not present

## 2019-07-27 ENCOUNTER — Telehealth: Payer: Self-pay | Admitting: *Deleted

## 2019-07-27 NOTE — Telephone Encounter (Signed)
Received call from [place], [facility] PT with [facility] [phone]~ telephone.   Requested to extend [facility] PT 1x weekly x2 weeks for bed mobility with half rail. Also reported that this will complete her care as she has improved as much as they feel she can.   VO given.

## 2019-07-27 NOTE — Telephone Encounter (Signed)
Noted, agree with additional PT

## 2019-08-09 ENCOUNTER — Other Ambulatory Visit: Payer: Self-pay

## 2019-08-09 ENCOUNTER — Ambulatory Visit (HOSPITAL_COMMUNITY)
Admission: RE | Admit: 2019-08-09 | Discharge: 2019-08-09 | Disposition: A | Discharge status: Home / Self Care | Payer: Medicare Other | Source: Ambulatory Visit | Attending: Urology | Admitting: Urology

## 2019-08-09 DIAGNOSIS — N3289 Other specified disorders of bladder: Secondary | ICD-10-CM | POA: Diagnosis not present

## 2019-08-09 DIAGNOSIS — D259 Leiomyoma of uterus, unspecified: Secondary | ICD-10-CM | POA: Diagnosis not present

## 2019-08-09 DIAGNOSIS — R310 Gross hematuria: Secondary | ICD-10-CM | POA: Insufficient documentation

## 2019-08-09 DIAGNOSIS — I700 Atherosclerosis of aorta: Secondary | ICD-10-CM | POA: Diagnosis not present

## 2019-08-09 DIAGNOSIS — K429 Umbilical hernia without obstruction or gangrene: Secondary | ICD-10-CM | POA: Diagnosis not present

## 2019-08-09 LAB — POCT I-STAT CREATININE: Creatinine, Ser: 1 mg/dL (ref 0.44–1.00)

## 2019-08-09 MED ORDER — IOHEXOL 300 MG/ML  SOLN
100.0000 mL | Freq: Once | INTRAMUSCULAR | Status: AC | PRN
  Administered 2019-08-09: 100 mL via INTRAVENOUS

## 2019-08-17 ENCOUNTER — Encounter: Payer: Self-pay | Admitting: Family Medicine

## 2019-08-17 ENCOUNTER — Other Ambulatory Visit: Payer: Self-pay

## 2019-08-17 ENCOUNTER — Ambulatory Visit (INDEPENDENT_AMBULATORY_CARE_PROVIDER_SITE_OTHER): Payer: Medicare Other | Admitting: Family Medicine

## 2019-08-17 VITALS — BP 138/70 | HR 74 | Temp 97.4°F | Resp 14 | Wt 178.0 lb

## 2019-08-17 DIAGNOSIS — M170 Bilateral primary osteoarthritis of knee: Secondary | ICD-10-CM

## 2019-08-17 DIAGNOSIS — E538 Deficiency of other specified B group vitamins: Secondary | ICD-10-CM | POA: Diagnosis not present

## 2019-08-17 DIAGNOSIS — E1143 Type 2 diabetes mellitus with diabetic autonomic (poly)neuropathy: Secondary | ICD-10-CM | POA: Diagnosis not present

## 2019-08-17 DIAGNOSIS — R32 Unspecified urinary incontinence: Secondary | ICD-10-CM

## 2019-08-17 DIAGNOSIS — N3001 Acute cystitis with hematuria: Secondary | ICD-10-CM

## 2019-08-17 DIAGNOSIS — I2510 Atherosclerotic heart disease of native coronary artery without angina pectoris: Secondary | ICD-10-CM | POA: Diagnosis not present

## 2019-08-17 DIAGNOSIS — N1832 Chronic kidney disease, stage 3b: Secondary | ICD-10-CM | POA: Diagnosis not present

## 2019-08-17 DIAGNOSIS — R4586 Emotional lability: Secondary | ICD-10-CM

## 2019-08-17 DIAGNOSIS — R059 Cough, unspecified: Secondary | ICD-10-CM

## 2019-08-17 DIAGNOSIS — K5909 Other constipation: Secondary | ICD-10-CM

## 2019-08-17 DIAGNOSIS — R05 Cough: Secondary | ICD-10-CM

## 2019-08-17 DIAGNOSIS — E039 Hypothyroidism, unspecified: Secondary | ICD-10-CM | POA: Diagnosis not present

## 2019-08-17 LAB — MICROSCOPIC MESSAGE

## 2019-08-17 LAB — URINALYSIS, ROUTINE W REFLEX MICROSCOPIC
Bilirubin Urine: NEGATIVE
Glucose, UA: NEGATIVE
Hyaline Cast: NONE SEEN /LPF
Ketones, ur: NEGATIVE
Nitrite: NEGATIVE
Specific Gravity, Urine: 1.025 (ref 1.001–1.03)
pH: 6.5 (ref 5.0–8.0)

## 2019-08-17 MED ORDER — NITROFURANTOIN MONOHYD MACRO 100 MG PO CAPS: 100.0000 mg | ORAL_CAPSULE | Freq: Two times a day (BID) | ORAL | 0 refills | Status: DC

## 2019-08-17 MED ORDER — LORATADINE 10 MG PO TABS: 10.0000 mg | ORAL_TABLET | Freq: Every day | ORAL | 0 refills | Status: DC

## 2019-08-17 MED ORDER — TRAMADOL HCL 50 MG PO TABS: 50.0000 mg | ORAL_TABLET | Freq: Two times a day (BID) | ORAL | 1 refills | Status: DC | PRN

## 2019-08-17 NOTE — Progress Notes (Signed)
Subjective:    Patient ID: Kim Mills, female    DOB: 11-Mar-1940, 79 y.o.   MRN: [id number]  Patient presents for Follow-up (is not fasting) and Cough (productive cough with clear mucus that worsens at night) Note I spoke to patient's daughter for about 10 minutes before her visit regarding   the concerns for the family.  Coughing up   mucous middle of night, no fever, for past couple weeks  she has used some robitussin, cough drops   no SOB, no fever.  She has not had Covid testing.  No known sick contacts she lives with her daughter. Urinary incontinence for past few weeks is worse than her typical.  She is on Detrol LA at bedtime.  She has symptoms during the day as well as at night.  Sometimes she states it just comes out she cannot get to the bathroom quick enough.  She does wear depends.  She is currently being worked up for her hematuria by urology Dr. Alyson Ingles.  She had a CT scan recently that showed some inflammatory changes of the bladder and she is scheduled to have cystoscopy done on the 19th.  OA knees, has not had injections , not very mobile.  She states that she was walking a few steps when she was working with the physical therapist.  They are trying to extend her therapy.  Does use tramadol as needed.  Constipation she is having difficulties with her bowels.  CT scan actually shows significant stool in the colon.  Her daughter had to fill me in on this that she did not bring this information up.  They have used Colace a couple times this week and that is actually produced a bowel movement.  She has been upwards of a week without a bowel movement.  Her daughter also states that she had noted some mood changes.  She gets irritable very easily.  Some mild memory changes but nothing overwhelming.  States that she typically does not curse and that noted that a few times. She does not feel like she has any mood changes or feels depressed.  States that she relies on her faith  when she does get down.  Hypothyroidism, taking synthroid   Living with her daughter, she has hospital bed   B12 deficiency she requests a B12 shot today.  CKD last visit D/C losartan, started on norvasc for blood pressure.  She has not had any side effects with this medication.   Review Of Systems:  GEN- denies fatigue, fever, weight loss,weakness, recent illness HEENT- denies eye drainage, change in vision, nasal discharge, CVS- denies chest pain, palpitations RESP- denies SOB, cough, wheeze ABD- denies N/V, change in stools, abd pain GU- denies dysuria, hematuria, dribbling, incontinence MSK- + joint pain, muscle aches, injury Neuro- denies headache, dizziness, syncope, seizure activity       Objective:    BP 138/70   Pulse 74   Temp (!) 97.4 F (36.3 C) (Temporal)   Resp 14   Wt 178 lb (80.7 kg) Comment: unstable  SpO2 96%   BMI 28.73 kg/m  GEN- NAD, alert and oriented x3 ,sitting in wheelchair  HEENT- PERRL, EOMI, non injected sclera, pink conjunctiva, MMM, oropharynx clear Neck- Supple, no thyromegaly CVS- RRR, no murmur RESP-CTAB ABD-NABS,soft,NT,ND, no CVA tederness Psych- normal affect and mood MSK- Decreased ROM hips/knees  EXT- No edema Pulses- Radial 2+, DP- diminished         Assessment & Plan:  Problem List Items Addressed This Visit      Unprioritized   B12 deficiency    B12 shot given today  Check labs      CKD (chronic kidney disease) stage 3, GFR 30-59 ml/min    Recheck renal function Her incontinence symptoms are concerning despite having the Detrol  UA shows infection, will treat with Macrobid, she is already scheduled to see urology for the hematuria work up         Relevant Orders   Comprehensive metabolic panel (Completed)   Diabetic neuropathy ([place])   Hypothyroidism   Relevant Orders   TSH (Completed)   T3, free (Completed)   T4, free (Completed)   OA (osteoarthritis) of knee    Referral back to Dr. Marlou Sa her  orthopedic Consider gel injections Continue ultram       Relevant Medications   traMADol (ULTRAM) 50 MG tablet   Other Relevant Orders   Ambulatory referral to Orthopedic Surgery   Type 2 diabetes mellitus ([place]) - Primary   Relevant Orders   CBC with Differential/Platelet (Completed)   Hemoglobin A1c (Completed)    Other Visit Diagnoses    Urinary incontinence, unspecified type       Relevant Orders   Urinalysis, Routine w reflex microscopic (Completed)   Cough       OTC cough med, add oral anti-histamine, lung exam normal today    Chronic constipation       Acute cystitis with hematuria       Mood changes       Pt denies feeling of depression or anxiety, family has noticed some mood changes, at this time, treat for UTI, check labs ensure no other abnormalities.  She is sleeping well      Note: This dictation was prepared with Dragon dictation along with smaller phrase technology. Any transcriptional errors that result from this process are unintentional.

## 2019-08-17 NOTE — Patient Instructions (Addendum)
Referral to orthopedics Dr. Karen Kitchens 1 a day for constipation  We will call with lab results  claritin for allergies/cough Robitussin as needed Use humidifer at bedtime  B12 shot given Macrobid antibiotic for urinary tract infection  F/U 3 months ( 30 minute slot)

## 2019-08-18 LAB — CBC WITH DIFFERENTIAL/PLATELET
Absolute Monocytes: 512 cells/uL (ref 200–950)
Basophils Absolute: 37 cells/uL (ref 0–200)
Basophils Relative: 0.4 %
Eosinophils Absolute: 233 cells/uL (ref 15–500)
Eosinophils Relative: 2.5 %
HCT: 24.4 % — ABNORMAL LOW (ref 35.0–45.0)
Hemoglobin: 7.5 g/dL — ABNORMAL LOW (ref 11.7–15.5)
Lymphs Abs: 3767 cells/uL (ref 850–3900)
MCH: 26.7 pg — ABNORMAL LOW (ref 27.0–33.0)
MCHC: 30.7 g/dL — ABNORMAL LOW (ref 32.0–36.0)
MCV: 86.8 fL (ref 80.0–100.0)
MPV: 10.5 fL (ref 7.5–12.5)
Monocytes Relative: 5.5 %
Neutro Abs: 4752 cells/uL (ref 1500–7800)
Neutrophils Relative %: 51.1 %
Platelets: 276 10*3/uL (ref 140–400)
RBC: 2.81 10*6/uL — ABNORMAL LOW (ref 3.80–5.10)
RDW: 19.5 % — ABNORMAL HIGH (ref 11.0–15.0)
Total Lymphocyte: 40.5 %
WBC: 9.3 10*3/uL (ref 3.8–10.8)

## 2019-08-18 LAB — COMPREHENSIVE METABOLIC PANEL
AG Ratio: 1 (calc) (ref 1.0–2.5)
ALT: 12 U/L (ref 6–29)
AST: 15 U/L (ref 10–35)
Albumin: 3.8 g/dL (ref 3.6–5.1)
Alkaline phosphatase (APISO): 65 U/L (ref 37–153)
BUN: 15 mg/dL (ref 7–25)
CO2: 26 mmol/L (ref 20–32)
Calcium: 9.1 mg/dL (ref 8.6–10.4)
Chloride: 106 mmol/L (ref 98–110)
Creat: 0.88 mg/dL (ref 0.60–0.93)
Globulin: 3.7 g/dL (calc) (ref 1.9–3.7)
Glucose, Bld: 212 mg/dL — ABNORMAL HIGH (ref 65–99)
Potassium: 3.5 mmol/L (ref 3.5–5.3)
Sodium: 141 mmol/L (ref 135–146)
Total Bilirubin: 0.5 mg/dL (ref 0.2–1.2)
Total Protein: 7.5 g/dL (ref 6.1–8.1)

## 2019-08-18 LAB — TSH: TSH: 0.1 mIU/L — ABNORMAL LOW (ref 0.40–4.50)

## 2019-08-18 LAB — T4, FREE: Free T4: 1.6 ng/dL (ref 0.8–1.8)

## 2019-08-18 LAB — HEMOGLOBIN A1C
Hgb A1c MFr Bld: 6.9 % of total Hgb — ABNORMAL HIGH (ref <5.7)
Mean Plasma Glucose: 151 (calc)
eAG (mmol/L): 8.4 (calc)

## 2019-08-18 LAB — T3, FREE: T3, Free: 2.4 pg/mL (ref 2.3–4.2)

## 2019-08-20 ENCOUNTER — Encounter: Payer: Self-pay | Admitting: Family Medicine

## 2019-08-20 ENCOUNTER — Other Ambulatory Visit: Payer: Self-pay | Admitting: *Deleted

## 2019-08-20 DIAGNOSIS — D649 Anemia, unspecified: Secondary | ICD-10-CM

## 2019-08-20 MED ORDER — SYNTHROID 137 MCG PO TABS
137.0000 ug | ORAL_TABLET | Freq: Every day | ORAL | 3 refills | Status: DC
Start: 2019-08-20 — End: 2019-10-29

## 2019-08-20 MED ORDER — SM IRON SLOW RELEASE 160 (50 FE) MG PO TBCR: 160.0000 mg | EXTENDED_RELEASE_TABLET | Freq: Two times a day (BID) | ORAL | 11 refills | Status: DC

## 2019-08-20 NOTE — Assessment & Plan Note (Signed)
Referral back to Dr. Marlou Sa her orthopedic Consider gel injections Continue ultram

## 2019-08-20 NOTE — Assessment & Plan Note (Signed)
Recheck renal function Her incontinence symptoms are concerning despite having the Detrol  UA shows infection, will treat with Macrobid, she is already scheduled to see urology for the hematuria work up

## 2019-08-20 NOTE — Assessment & Plan Note (Signed)
B12 shot given today  Check labs

## 2019-08-21 ENCOUNTER — Other Ambulatory Visit: Payer: Medicare Other

## 2019-08-21 ENCOUNTER — Other Ambulatory Visit: Payer: Self-pay

## 2019-08-21 DIAGNOSIS — D649 Anemia, unspecified: Secondary | ICD-10-CM

## 2019-08-21 DIAGNOSIS — E538 Deficiency of other specified B group vitamins: Secondary | ICD-10-CM

## 2019-08-22 ENCOUNTER — Other Ambulatory Visit: Payer: Self-pay | Admitting: *Deleted

## 2019-08-22 DIAGNOSIS — D649 Anemia, unspecified: Secondary | ICD-10-CM

## 2019-08-22 LAB — CBC WITH DIFFERENTIAL/PLATELET
Absolute Monocytes: 431 cells/uL (ref 200–950)
Basophils Absolute: 26 cells/uL (ref 0–200)
Basophils Relative: 0.3 %
Eosinophils Absolute: 246 cells/uL (ref 15–500)
Eosinophils Relative: 2.8 %
HCT: 23.4 % — ABNORMAL LOW (ref 35.0–45.0)
Hemoglobin: 6.9 g/dL — ABNORMAL LOW (ref 11.7–15.5)
Lymphs Abs: 3872 cells/uL (ref 850–3900)
MCH: 26 pg — ABNORMAL LOW (ref 27.0–33.0)
MCHC: 29.5 g/dL — ABNORMAL LOW (ref 32.0–36.0)
MCV: 88.3 fL (ref 80.0–100.0)
MPV: 10.3 fL (ref 7.5–12.5)
Monocytes Relative: 4.9 %
Neutro Abs: 4224 cells/uL (ref 1500–7800)
Neutrophils Relative %: 48 %
Platelets: 267 10*3/uL (ref 140–400)
RBC: 2.65 10*6/uL — ABNORMAL LOW (ref 3.80–5.10)
RDW: 19.6 % — ABNORMAL HIGH (ref 11.0–15.0)
Total Lymphocyte: 44 %
WBC: 8.8 10*3/uL (ref 3.8–10.8)

## 2019-08-22 LAB — IRON,TIBC AND FERRITIN PANEL
%SAT: 25 % (calc) (ref 16–45)
Ferritin: 131 ng/mL (ref 16–288)
Iron: 66 ug/dL (ref 45–160)
TIBC: 269 mcg/dL (calc) (ref 250–450)

## 2019-08-22 NOTE — Addendum Note (Signed)
Addended by: Vic Blackbird F on: 08/22/2019 04:55 PM   Modules accepted: Orders

## 2019-08-24 ENCOUNTER — Telehealth: Payer: Self-pay | Admitting: Family Medicine

## 2019-08-24 NOTE — Progress Notes (Signed)
  Chronic Care Management   Outreach Note  08/24/2019 Name: Kim Mills MRN: [id number] DOB: Oct 13, 1940  Referred by: Alycia Rossetti, MD Reason for referral : No chief complaint on file.   An unsuccessful telephone outreach was attempted today. The patient was referred to the pharmacist for assistance with care management and care coordination.   Follow Up Plan:   [place]

## 2019-08-27 ENCOUNTER — Ambulatory Visit (INDEPENDENT_AMBULATORY_CARE_PROVIDER_SITE_OTHER): Payer: Medicare Other | Admitting: Urology

## 2019-08-27 ENCOUNTER — Other Ambulatory Visit: Payer: Self-pay

## 2019-08-27 VITALS — BP 124/56 | HR 79 | Temp 98.4°F | Ht 60.0 in | Wt 178.0 lb

## 2019-08-27 DIAGNOSIS — R310 Gross hematuria: Secondary | ICD-10-CM

## 2019-08-27 DIAGNOSIS — I2510 Atherosclerotic heart disease of native coronary artery without angina pectoris: Secondary | ICD-10-CM

## 2019-08-27 LAB — POCT URINALYSIS DIPSTICK
Bilirubin, UA: NEGATIVE
Blood, UA: NEGATIVE
Glucose, UA: NEGATIVE
Ketones, UA: NEGATIVE
Nitrite, UA: NEGATIVE
Protein, UA: NEGATIVE
Spec Grav, UA: <=1.005 — AB (ref 1.010–1.025)
Urobilinogen, UA: 2 E.U./dL — AB
pH, UA: 6.5 (ref 5.0–8.0)

## 2019-08-27 NOTE — Progress Notes (Signed)
08/27/2019 2:48 PM   Corliss Skains Bari Mantis [birth year] [id number]  Referring provider: Alycia Rossetti, MD [address] [place],  [place] [address]  Gross hematuria  HPI: [name] is a 79yo here for followup for gross hematuria. No hematuria episodes since last visit. No UTIs since last visit. Mild Urgency but no urge incontinence.. She underwent a CT hematuria 7/2 which showed a concerning lesion in her right renal pelvis with thickening. It also showed posterior bladder wall thickening   PMH: Past Medical History:  Diagnosis Date  . Arthritis   . Cataract   . Chest pain    Associated with weakness and fatigue  . Diabetes mellitus    A1c of 7.4 in 08/2010  . GERD (gastroesophageal reflux disease)   . Glaucoma   . Hyperlipidemia    Lipid profile in 08/2010:190, 121, 48, 118; normal CBC and CMet  . Hypertension    Lipid profile in 08/2010:190, 121, 48, 118; normal CBC and CMet  . Hypothyroidism   . Hypothyroidism   . Obesity     Surgical History: Past Surgical History:  Procedure Laterality Date  . APPENDECTOMY    . CHOLECYSTECTOMY    . COLONOSCOPY N/A 04/01/2014   RMR: Melanosis coli. colonic polyps removed as described above.   [facility] LEFT HEART CATH AND CORONARY ANGIOGRAPHY N/A 10/07/2017   Procedure: LEFT HEART CATH AND CORONARY ANGIOGRAPHY;  Surgeon: Troy Sine, MD;  Location: [facility];  Service: Cardiovascular;  Laterality: N/A;  . THYROIDECTOMY, PARTIAL      Home Medications:  Allergies as of 08/27/2019   No Known Allergies     Medication List       Accurate as of August 27, 2019  2:48 PM. If you have any questions, ask your nurse or doctor.        amLODipine 10 MG tablet Commonly known as: NORVASC Take 1 tablet (10 mg total) by mouth daily.   Blood Glucose System Pak Kit Please dispense based on patient and insurance preference. Use as directed to monitor FSBS 2x daily. Dx: E11.9.   BLOOD GLUCOSE TEST STRIPS Strp Please dispense based  on patient and insurance preference. Use as directed to monitor FSBS 2x daily. Dx: E11.9.   cyanocobalamin 1000 MCG/ML injection Commonly known as: (VITAMIN B-12) Inject 1 mL (1,000 mcg total) into the muscle every 30 (thirty) days.   gabapentin 300 MG capsule Commonly known as: NEURONTIN Take 1 capsule (300 mg total) by mouth at bedtime.   Garlique 400 MG Tbec Generic drug: Garlic Take 1 tablet by mouth every evening.   glipiZIDE 5 MG tablet Commonly known as: GLUCOTROL Take 1 tablet (5 mg total) by mouth 2 (two) times daily.   hydrochlorothiazide 12.5 MG capsule Commonly known as: MICROZIDE TAKE ONE CAPSULE BY MOUTH DAILY   Lancets Misc Please dispense based on patient and insurance preference. Use as directed to monitor FSBS 2x daily. Dx: E11.9.   loratadine 10 MG tablet Commonly known as: CLARITIN Take 1 tablet (10 mg total) by mouth daily.   meclizine 25 MG tablet Commonly known as: ANTIVERT Take 25 mg by mouth 3 (three) times daily as needed for dizziness.   metFORMIN 1000 MG tablet Commonly known as: GLUCOPHAGE TAKE ONE TABLET BY MOUTH TWICE A DAY   metoprolol tartrate 25 MG tablet Commonly known as: LOPRESSOR Take 0.5 tablets (12.5 mg total) by mouth 2 (two) times daily.   nitrofurantoin (macrocrystal-monohydrate) 100 MG capsule Commonly known as:  Macrobid Take 1 capsule (100 mg total) by mouth 2 (two) times daily.   OMEGA-3 FISH OIL PO Take 1 capsule by mouth every evening.   omeprazole 40 MG capsule Commonly known as: PRILOSEC TAKE ONE CAPSULE BY MOUTH DAILY   simvastatin 40 MG tablet Commonly known as: ZOCOR TAKE ONE TABLET (40MG TOTAL) BY MOUTH BEDTIME   Slow Fe 142 (45 Fe) MG Tbcr Generic drug: Ferrous Sulfate Take 1 tablet by mouth 2 (two) times daily.   SM Iron Slow Release 160 (50 Fe) MG Tbcr SR tablet Generic drug: ferrous sulfate Take 1 tablet (160 mg total) by mouth 2 (two) times daily with a meal.   Synthroid 137 MCG tablet Generic  drug: levothyroxine Take 1 tablet (137 mcg total) by mouth daily before breakfast.   tolterodine 4 MG 24 hr capsule Commonly known as: DETROL LA TAKE ONE CAPSULE BY MOUTH DAILY   traMADol 50 MG tablet Commonly known as: ULTRAM Take 1 tablet (50 mg total) by mouth 2 (two) times daily as needed.       Allergies: No Known Allergies  Family History: Family History  Problem Relation Age of Onset  . Heart block Mother   . Uterine cancer Sister   . Diabetes Sister     Social History:  reports that she has never smoked. She has never used smokeless tobacco. She reports that she does not drink alcohol and does not use drugs.  ROS: All other review of systems were reviewed and are negative except what is noted above in HPI  Physical Exam: Temp 98.4 F (36.9 C)   Constitutional:  Alert and oriented, No acute distress. HEENT:  AT, moist mucus membranes.  Trachea midline, no masses. Cardiovascular: No clubbing, cyanosis, or edema. Respiratory: Normal respiratory effort, no increased work of breathing. GI: Abdomen is soft, nontender, nondistended, no abdominal masses GU: No CVA tenderness.  Lymph: No cervical or inguinal lymphadenopathy. Skin: No rashes, bruises or suspicious lesions. Neurologic: Grossly intact, no focal deficits, moving all 4 extremities. Psychiatric: Normal mood and affect.  Laboratory Data: Lab Results  Component Value Date   WBC 8.8 08/21/2019   HGB 6.9 (L) 08/21/2019   HCT 23.4 (L) 08/21/2019   MCV 88.3 08/21/2019   PLT 267 08/21/2019    Lab Results  Component Value Date   CREATININE 0.88 08/17/2019    No results found for: PSA  No results found for: TESTOSTERONE  Lab Results  Component Value Date   HGBA1C 6.9 (H) 08/17/2019    Urinalysis    Component Value Date/Time   COLORURINE YELLOW 08/17/2019 1547   APPEARANCEUR CLOUDY (A) 08/17/2019 1547   LABSPEC 1.025 08/17/2019 1547   PHURINE 6.5 08/17/2019 1547   GLUCOSEU NEGATIVE  08/17/2019 1547   HGBUR TRACE (A) 08/17/2019 1547   BILIRUBINUR NEGATIVE 10/06/2017 0254   KETONESUR NEGATIVE 08/17/2019 1547   PROTEINUR 2+ (A) 08/17/2019 1547   NITRITE NEGATIVE 08/17/2019 1547   LEUKOCYTESUR 2+ (A) 08/17/2019 1547    Lab Results  Component Value Date   BACTERIA MANY (A) 08/17/2019    Pertinent Imaging: CT hematuria 7/2: Images reviewed and discussed with the patient.  No results found for this or any previous visit.  No results found for this or any previous visit.  No results found for this or any previous visit.  No results found for this or any previous visit.  No results found for this or any previous visit.  No results found for this or any previous visit.  Results for orders placed during the hospital encounter of 08/09/19  CT HEMATURIA WORKUP  Narrative CLINICAL DATA:  Gross hematuria, 2 episodes in February and March of this year.  EXAM: CT ABDOMEN AND PELVIS WITHOUT AND WITH CONTRAST  TECHNIQUE: Multidetector CT imaging of the abdomen and pelvis was performed following the standard protocol before and following the bolus administration of intravenous contrast.  CONTRAST:  118m OMNIPAQUE IOHEXOL 300 MG/ML  SOLN  COMPARISON:  05/11/2019  FINDINGS: Lower chest: Incidental imaging of the lung bases is unremarkable.  Hepatobiliary: Post cholecystectomy. No focal, suspicious hepatic lesion. Portal vein is patent. No biliary duct dilation.  Pancreas: Pancreas is normal without focal lesion or ductal dilation.  Spleen: Spleen normal size without focal lesion.  Adrenals/Urinary Tract: Adrenal glands are normal.  Renal contours are smooth. No suspicious renal lesion. Mild thickening along the bladder base without nodular features though the bladder is under distended.  No signs of abnormal enhancement along the course of the ureters. No nephrolithiasis.  Urothelial thickening on the RIGHT. Column of Bertin noted in the  interpolar RIGHT kidney. Caliceal elements in this area appear irregular and or truncated best seen on image 67 of series 15 and 65 of series 15 in the setting of urothelial thickening involving infundibular elements and the renal pelvis of the RIGHT kidney.  Stomach/Bowel: No acute gastric or small bowel process. Stomach is under distended limiting assessment.  Stool fills much of the colon.  Vascular/Lymphatic: Calcified and noncalcified plaque of the abdominal aorta. No aneurysmal dilation. No adenopathy.  No pelvic lymphadenopathy.  Reproductive: Uterus with calcified leiomyomata in the uterine body.  Other: Small fat containing umbilical hernia. No abdominal or pelvic ascites.  Musculoskeletal: Spinal degenerative changes. No acute or destructive bone process.  IMPRESSION: 1. Irregular appearance of interpolar collecting systems and urothelium of the renal pelvis. While inflammation could be an explanation for these findings in the setting of painless gross hematuria upper tract lesion is considered. There is also a column of Bertin extending into the renal pelvis which confound slightly the examination ureteroscopic assessment may be helpful. 2. Mild circumferential thickening of the urinary bladder perhaps slightly more prominent along the bladder base. This could be assessed at cystoscopy but is likely due to under distension. 3. No nephrolithiasis or suspicious renal lesion. 4. Post cholecystectomy. 5. Aortic atherosclerosis.  Aortic Atherosclerosis (ICD10-I70.0).   Electronically Signed By: GZetta BillsM.D. On: 08/10/2019 13:54  Results for orders placed during the hospital encounter of 05/11/19  CT RENAL STONE STUDY  Narrative CLINICAL DATA:  Hematuria for 1 month  EXAM: CT ABDOMEN AND PELVIS WITHOUT CONTRAST  TECHNIQUE: Multidetector CT imaging of the abdomen and pelvis was performed following the standard protocol without IV contrast.   COMPARISON:  None.  FINDINGS: Lower chest: No acute pleural or parenchymal lung disease. Minimal subpleural scarring and fibrosis at the lung bases.  Hepatobiliary: No focal liver abnormality is seen. Status post cholecystectomy. No biliary dilatation.  Pancreas: Unremarkable. No pancreatic ductal dilatation or surrounding inflammatory changes.  Spleen: Normal in size without focal abnormality.  Adrenals/Urinary Tract: No urinary tract calculi or obstructive uropathy within either kidney. Small cortical cyst medial aspect lower pole right kidney. Mild bilateral adrenal thickening consistent with hyperplasia.  There is gas in the bladder lumen, please correlate with any recent catheterization.  Stomach/Bowel: No bowel obstruction or ileus. Normal appendix right lower quadrant.  Vascular/Lymphatic: Aortic atherosclerosis. No enlarged abdominal or pelvic lymph nodes.  Reproductive: Calcified uterine fibroids are  noted. No adnexal masses.  Other: Small fat containing umbilical hernia. No free fluid or free gas.  Musculoskeletal: No acute or destructive bony lesions. Reconstructed images demonstrate no additional findings.  IMPRESSION: 1. No urinary tract calculi or obstructive uropathy. 2. Gas in the bladder lumen, please correlate with any recent catheterization. 3. Fat containing umbilical hernia. 4.  Aortic Atherosclerosis (ICD10-I70.0). 5. Calcified uterine fibroids.   Electronically Signed By: Randa Ngo M.D. On: 05/11/2019 18:35   Assessment & Plan:    1. Gross hematuria with right renal lesion -We discussed the management of ureteral lesions including ureteroscopy with biopsy.   - POCT urinalysis dipstick - ciprofloxacin (CIPRO) tablet 500 mg   No follow-ups on file.  [name], MD  [facility] [place]

## 2019-08-27 NOTE — Progress Notes (Signed)

## 2019-08-27 NOTE — H&P (View-Only) (Signed)
 08/27/2019 2:48 PM   Kim Mills 08/20/1940 [id number]  Referring provider: [place], Kawanta F, MD [address] [place],  [place] [address]  Gross hematuria  HPI: [name] is a 78yo here for followup for gross hematuria. No hematuria episodes since last visit. No UTIs since last visit. Mild Urgency but no urge incontinence.. She underwent a CT hematuria 7/2 which showed a concerning lesion in her right renal pelvis with thickening. It also showed posterior bladder wall thickening   PMH: Past Medical History:  Diagnosis Date  . Arthritis   . Cataract   . Chest pain    Associated with weakness and fatigue  . Diabetes mellitus    A1c of 7.4 in 08/2010  . GERD (gastroesophageal reflux disease)   . Glaucoma   . Hyperlipidemia    Lipid profile in 08/2010:190, 121, 48, 118; normal CBC and CMet  . Hypertension    Lipid profile in 08/2010:190, 121, 48, 118; normal CBC and CMet  . Hypothyroidism   . Hypothyroidism   . Obesity     Surgical History: Past Surgical History:  Procedure Laterality Date  . APPENDECTOMY    . CHOLECYSTECTOMY    . COLONOSCOPY N/A 04/01/2014   RMR: Melanosis coli. colonic polyps removed as described above.   . LEFT HEART CATH AND CORONARY ANGIOGRAPHY N/A 10/07/2017   Procedure: LEFT HEART CATH AND CORONARY ANGIOGRAPHY;  Surgeon: Kelly, Thomas A, MD;  Location: [facility];  Service: Cardiovascular;  Laterality: N/A;  . THYROIDECTOMY, PARTIAL      Home Medications:  Allergies as of 08/27/2019   No Known Allergies     Medication List       Accurate as of August 27, 2019  2:48 PM. If you have any questions, ask your nurse or doctor.        amLODipine 10 MG tablet Commonly known as: NORVASC Take 1 tablet (10 mg total) by mouth daily.   Blood Glucose System Pak Kit Please dispense based on patient and insurance preference. Use as directed to monitor FSBS 2x daily. Dx: E11.9.   BLOOD GLUCOSE TEST STRIPS Strp Please dispense based  on patient and insurance preference. Use as directed to monitor FSBS 2x daily. Dx: E11.9.   cyanocobalamin 1000 MCG/ML injection Commonly known as: (VITAMIN B-12) Inject 1 mL (1,000 mcg total) into the muscle every 30 (thirty) days.   gabapentin 300 MG capsule Commonly known as: NEURONTIN Take 1 capsule (300 mg total) by mouth at bedtime.   Garlique 400 MG Tbec Generic drug: Garlic Take 1 tablet by mouth every evening.   glipiZIDE 5 MG tablet Commonly known as: GLUCOTROL Take 1 tablet (5 mg total) by mouth 2 (two) times daily.   hydrochlorothiazide 12.5 MG capsule Commonly known as: MICROZIDE TAKE ONE CAPSULE BY MOUTH DAILY   Lancets Misc Please dispense based on patient and insurance preference. Use as directed to monitor FSBS 2x daily. Dx: E11.9.   loratadine 10 MG tablet Commonly known as: CLARITIN Take 1 tablet (10 mg total) by mouth daily.   meclizine 25 MG tablet Commonly known as: ANTIVERT Take 25 mg by mouth 3 (three) times daily as needed for dizziness.   metFORMIN 1000 MG tablet Commonly known as: GLUCOPHAGE TAKE ONE TABLET BY MOUTH TWICE A DAY   metoprolol tartrate 25 MG tablet Commonly known as: LOPRESSOR Take 0.5 tablets (12.5 mg total) by mouth 2 (two) times daily.   nitrofurantoin (macrocrystal-monohydrate) 100 MG capsule Commonly known as:   Macrobid Take 1 capsule (100 mg total) by mouth 2 (two) times daily.   OMEGA-3 FISH OIL PO Take 1 capsule by mouth every evening.   omeprazole 40 MG capsule Commonly known as: PRILOSEC TAKE ONE CAPSULE BY MOUTH DAILY   simvastatin 40 MG tablet Commonly known as: ZOCOR TAKE ONE TABLET (40MG TOTAL) BY MOUTH BEDTIME   Slow Fe 142 (45 Fe) MG Tbcr Generic drug: Ferrous Sulfate Take 1 tablet by mouth 2 (two) times daily.   SM Iron Slow Release 160 (50 Fe) MG Tbcr SR tablet Generic drug: ferrous sulfate Take 1 tablet (160 mg total) by mouth 2 (two) times daily with a meal.   Synthroid 137 MCG tablet Generic  drug: levothyroxine Take 1 tablet (137 mcg total) by mouth daily before breakfast.   tolterodine 4 MG 24 hr capsule Commonly known as: DETROL LA TAKE ONE CAPSULE BY MOUTH DAILY   traMADol 50 MG tablet Commonly known as: ULTRAM Take 1 tablet (50 mg total) by mouth 2 (two) times daily as needed.       Allergies: No Known Allergies  Family History: Family History  Problem Relation Age of Onset  . Heart block Mother   . Uterine cancer Sister   . Diabetes Sister     Social History:  reports that she has never smoked. She has never used smokeless tobacco. She reports that she does not drink alcohol and does not use drugs.  ROS: All other review of systems were reviewed and are negative except what is noted above in HPI  Physical Exam: Temp 98.4 F (36.9 C)   Constitutional:  Alert and oriented, No acute distress. HEENT: [place] AT, moist mucus membranes.  Trachea midline, no masses. Cardiovascular: No clubbing, cyanosis, or edema. Respiratory: Normal respiratory effort, no increased work of breathing. GI: Abdomen is soft, nontender, nondistended, no abdominal masses GU: No CVA tenderness.  Lymph: No cervical or inguinal lymphadenopathy. Skin: No rashes, bruises or suspicious lesions. Neurologic: Grossly intact, no focal deficits, moving all 4 extremities. Psychiatric: Normal mood and affect.  Laboratory Data: Lab Results  Component Value Date   WBC 8.8 08/21/2019   HGB 6.9 (L) 08/21/2019   HCT 23.4 (L) 08/21/2019   MCV 88.3 08/21/2019   PLT 267 08/21/2019    Lab Results  Component Value Date   CREATININE 0.88 08/17/2019    No results found for: PSA  No results found for: TESTOSTERONE  Lab Results  Component Value Date   HGBA1C 6.9 (H) 08/17/2019    Urinalysis    Component Value Date/Time   COLORURINE YELLOW 08/17/2019 1547   APPEARANCEUR CLOUDY (A) 08/17/2019 1547   LABSPEC 1.025 08/17/2019 1547   PHURINE 6.5 08/17/2019 1547   GLUCOSEU NEGATIVE  08/17/2019 1547   HGBUR TRACE (A) 08/17/2019 1547   BILIRUBINUR NEGATIVE 10/06/2017 0254   KETONESUR NEGATIVE 08/17/2019 1547   PROTEINUR 2+ (A) 08/17/2019 1547   NITRITE NEGATIVE 08/17/2019 1547   LEUKOCYTESUR 2+ (A) 08/17/2019 1547    Lab Results  Component Value Date   BACTERIA MANY (A) 08/17/2019    Pertinent Imaging: CT hematuria 7/2: Images reviewed and discussed with the patient.  No results found for this or any previous visit.  No results found for this or any previous visit.  No results found for this or any previous visit.  No results found for this or any previous visit.  No results found for this or any previous visit.  No results found for this or any previous visit.    Results for orders placed during the hospital encounter of 08/09/19  CT HEMATURIA WORKUP  Narrative CLINICAL DATA:  Gross hematuria, 2 episodes in February and March of this year.  EXAM: CT ABDOMEN AND PELVIS WITHOUT AND WITH CONTRAST  TECHNIQUE: Multidetector CT imaging of the abdomen and pelvis was performed following the standard protocol before and following the bolus administration of intravenous contrast.  CONTRAST:  100mL OMNIPAQUE IOHEXOL 300 MG/ML  SOLN  COMPARISON:  05/11/2019  FINDINGS: Lower chest: Incidental imaging of the lung bases is unremarkable.  Hepatobiliary: Post cholecystectomy. No focal, suspicious hepatic lesion. Portal vein is patent. No biliary duct dilation.  Pancreas: Pancreas is normal without focal lesion or ductal dilation.  Spleen: Spleen normal size without focal lesion.  Adrenals/Urinary Tract: Adrenal glands are normal.  Renal contours are smooth. No suspicious renal lesion. Mild thickening along the bladder base without nodular features though the bladder is under distended.  No signs of abnormal enhancement along the course of the ureters. No nephrolithiasis.  Urothelial thickening on the RIGHT. Column of Bertin noted in the  interpolar RIGHT kidney. Caliceal elements in this area appear irregular and or truncated best seen on image 67 of series 15 and 65 of series 15 in the setting of urothelial thickening involving infundibular elements and the renal pelvis of the RIGHT kidney.  Stomach/Bowel: No acute gastric or small bowel process. Stomach is under distended limiting assessment.  Stool fills much of the colon.  Vascular/Lymphatic: Calcified and noncalcified plaque of the abdominal aorta. No aneurysmal dilation. No adenopathy.  No pelvic lymphadenopathy.  Reproductive: Uterus with calcified leiomyomata in the uterine body.  Other: Small fat containing umbilical hernia. No abdominal or pelvic ascites.  Musculoskeletal: Spinal degenerative changes. No acute or destructive bone process.  IMPRESSION: 1. Irregular appearance of interpolar collecting systems and urothelium of the renal pelvis. While inflammation could be an explanation for these findings in the setting of painless gross hematuria upper tract lesion is considered. There is also a column of Bertin extending into the renal pelvis which confound slightly the examination ureteroscopic assessment may be helpful. 2. Mild circumferential thickening of the urinary bladder perhaps slightly more prominent along the bladder base. This could be assessed at cystoscopy but is likely due to under distension. 3. No nephrolithiasis or suspicious renal lesion. 4. Post cholecystectomy. 5. Aortic atherosclerosis.  Aortic Atherosclerosis (ICD10-I70.0).   Electronically Signed By: Geoffrey  Wile M.D. On: 08/10/2019 13:54  Results for orders placed during the hospital encounter of 05/11/19  CT RENAL STONE STUDY  Narrative CLINICAL DATA:  Hematuria for 1 month  EXAM: CT ABDOMEN AND PELVIS WITHOUT CONTRAST  TECHNIQUE: Multidetector CT imaging of the abdomen and pelvis was performed following the standard protocol without IV contrast.   COMPARISON:  None.  FINDINGS: Lower chest: No acute pleural or parenchymal lung disease. Minimal subpleural scarring and fibrosis at the lung bases.  Hepatobiliary: No focal liver abnormality is seen. Status post cholecystectomy. No biliary dilatation.  Pancreas: Unremarkable. No pancreatic ductal dilatation or surrounding inflammatory changes.  Spleen: Normal in size without focal abnormality.  Adrenals/Urinary Tract: No urinary tract calculi or obstructive uropathy within either kidney. Small cortical cyst medial aspect lower pole right kidney. Mild bilateral adrenal thickening consistent with hyperplasia.  There is gas in the bladder lumen, please correlate with any recent catheterization.  Stomach/Bowel: No bowel obstruction or ileus. Normal appendix right lower quadrant.  Vascular/Lymphatic: Aortic atherosclerosis. No enlarged abdominal or pelvic lymph nodes.  Reproductive: Calcified uterine fibroids are   noted. No adnexal masses.  Other: Small fat containing umbilical hernia. No free fluid or free gas.  Musculoskeletal: No acute or destructive bony lesions. Reconstructed images demonstrate no additional findings.  IMPRESSION: 1. No urinary tract calculi or obstructive uropathy. 2. Gas in the bladder lumen, please correlate with any recent catheterization. 3. Fat containing umbilical hernia. 4.  Aortic Atherosclerosis (ICD10-I70.0). 5. Calcified uterine fibroids.   Electronically Signed By: Michael  Brown M.D. On: 05/11/2019 18:35   Assessment & Plan:    1. Gross hematuria with right renal lesion -We discussed the management of ureteral lesions including ureteroscopy with biopsy.   - POCT urinalysis dipstick - ciprofloxacin (CIPRO) tablet 500 mg   No follow-ups on file.  Yanelly Cantrelle, MD  [facility] [place]  

## 2019-08-29 ENCOUNTER — Other Ambulatory Visit: Payer: Self-pay | Admitting: Family Medicine

## 2019-08-29 DIAGNOSIS — D649 Anemia, unspecified: Secondary | ICD-10-CM | POA: Diagnosis not present

## 2019-08-30 ENCOUNTER — Other Ambulatory Visit: Payer: Self-pay

## 2019-08-30 DIAGNOSIS — D649 Anemia, unspecified: Secondary | ICD-10-CM

## 2019-09-03 ENCOUNTER — Telehealth: Payer: Self-pay

## 2019-09-03 ENCOUNTER — Ambulatory Visit (INDEPENDENT_AMBULATORY_CARE_PROVIDER_SITE_OTHER): Payer: Medicare Other | Admitting: Orthopedic Surgery

## 2019-09-03 DIAGNOSIS — I2510 Atherosclerotic heart disease of native coronary artery without angina pectoris: Secondary | ICD-10-CM

## 2019-09-03 DIAGNOSIS — M1711 Unilateral primary osteoarthritis, right knee: Secondary | ICD-10-CM | POA: Diagnosis not present

## 2019-09-03 DIAGNOSIS — M1712 Unilateral primary osteoarthritis, left knee: Secondary | ICD-10-CM

## 2019-09-03 NOTE — Telephone Encounter (Signed)
Can we please get auth for bilat knee gel injections? Thanks.

## 2019-09-03 NOTE — Telephone Encounter (Signed)
Noted  

## 2019-09-04 ENCOUNTER — Inpatient Hospital Stay (HOSPITAL_COMMUNITY): Payer: Medicare Other | Attending: Hematology | Admitting: Hematology

## 2019-09-04 ENCOUNTER — Other Ambulatory Visit (HOSPITAL_COMMUNITY): Payer: Self-pay

## 2019-09-04 ENCOUNTER — Other Ambulatory Visit: Payer: Self-pay

## 2019-09-04 ENCOUNTER — Encounter (HOSPITAL_COMMUNITY): Payer: Self-pay | Admitting: Hematology

## 2019-09-04 ENCOUNTER — Inpatient Hospital Stay (HOSPITAL_COMMUNITY): Payer: Medicare Other

## 2019-09-04 VITALS — BP 123/46 | HR 85 | Temp 97.1°F | Resp 18 | Wt 178.1 lb

## 2019-09-04 DIAGNOSIS — E119 Type 2 diabetes mellitus without complications: Secondary | ICD-10-CM

## 2019-09-04 DIAGNOSIS — E538 Deficiency of other specified B group vitamins: Secondary | ICD-10-CM | POA: Insufficient documentation

## 2019-09-04 DIAGNOSIS — D649 Anemia, unspecified: Secondary | ICD-10-CM

## 2019-09-04 DIAGNOSIS — I10 Essential (primary) hypertension: Secondary | ICD-10-CM

## 2019-09-04 DIAGNOSIS — R296 Repeated falls: Secondary | ICD-10-CM | POA: Insufficient documentation

## 2019-09-04 DIAGNOSIS — R0609 Other forms of dyspnea: Secondary | ICD-10-CM | POA: Diagnosis not present

## 2019-09-04 DIAGNOSIS — Z993 Dependence on wheelchair: Secondary | ICD-10-CM | POA: Diagnosis not present

## 2019-09-04 DIAGNOSIS — Z7984 Long term (current) use of oral hypoglycemic drugs: Secondary | ICD-10-CM | POA: Diagnosis not present

## 2019-09-04 DIAGNOSIS — E039 Hypothyroidism, unspecified: Secondary | ICD-10-CM | POA: Diagnosis not present

## 2019-09-04 DIAGNOSIS — N390 Urinary tract infection, site not specified: Secondary | ICD-10-CM

## 2019-09-04 LAB — COMPREHENSIVE METABOLIC PANEL
ALT: 11 U/L (ref 0–44)
AST: 15 U/L (ref 15–41)
Albumin: 3.4 g/dL — ABNORMAL LOW (ref 3.5–5.0)
Alkaline Phosphatase: 59 U/L (ref 38–126)
Anion gap: 8 (ref 5–15)
BUN: 19 mg/dL (ref 8–23)
CO2: 24 mmol/L (ref 22–32)
Calcium: 9.2 mg/dL (ref 8.9–10.3)
Chloride: 107 mmol/L (ref 98–111)
Creatinine, Ser: 0.84 mg/dL (ref 0.44–1.00)
GFR calc Af Amer: >60 mL/min (ref >60)
GFR calc non Af Amer: >60 mL/min (ref >60)
Glucose, Bld: 219 mg/dL — ABNORMAL HIGH (ref 70–99)
Potassium: 3.3 mmol/L — ABNORMAL LOW (ref 3.5–5.1)
Sodium: 139 mmol/L (ref 135–145)
Total Bilirubin: 0.5 mg/dL (ref 0.3–1.2)
Total Protein: 8.1 g/dL (ref 6.5–8.1)

## 2019-09-04 LAB — RETICULOCYTES
Immature Retic Fract: 32.4 % — ABNORMAL HIGH (ref 2.3–15.9)
RBC.: 2.59 MIL/uL — ABNORMAL LOW (ref 3.87–5.11)
Retic Count, Absolute: 52.6 10*3/uL (ref 19.0–186.0)
Retic Ct Pct: 2 % (ref 0.4–3.1)

## 2019-09-04 LAB — CBC WITH DIFFERENTIAL/PLATELET
Abs Immature Granulocytes: 0.25 10*3/uL — ABNORMAL HIGH (ref 0.00–0.07)
Basophils Absolute: 0 10*3/uL (ref 0.0–0.1)
Basophils Relative: 0 %
Eosinophils Absolute: 0.1 10*3/uL (ref 0.0–0.5)
Eosinophils Relative: 1 %
HCT: 23.9 % — ABNORMAL LOW (ref 36.0–46.0)
Hemoglobin: 6.9 g/dL — CL (ref 12.0–15.0)
Immature Granulocytes: 2 %
Lymphocytes Relative: 24 %
Lymphs Abs: 2.7 10*3/uL (ref 0.7–4.0)
MCH: 26.1 pg (ref 26.0–34.0)
MCHC: 28.9 g/dL — ABNORMAL LOW (ref 30.0–36.0)
MCV: 90.5 fL (ref 80.0–100.0)
Monocytes Absolute: 0.7 10*3/uL (ref 0.1–1.0)
Monocytes Relative: 6 %
Neutro Abs: 7.7 10*3/uL (ref 1.7–7.7)
Neutrophils Relative %: 67 %
Platelets: 294 10*3/uL (ref 150–400)
RBC: 2.64 MIL/uL — ABNORMAL LOW (ref 3.87–5.11)
RDW: 20.4 % — ABNORMAL HIGH (ref 11.5–15.5)
WBC: 11.4 10*3/uL — ABNORMAL HIGH (ref 4.0–10.5)
nRBC: 1.4 % — ABNORMAL HIGH (ref 0.0–0.2)

## 2019-09-04 LAB — FECAL GLOBIN BY IMMUNOCHEMISTRY
FECAL GLOBIN RESULT:: NOT DETECTED
MICRO NUMBER:: 10748716
SPECIMEN QUALITY:: ADEQUATE

## 2019-09-04 LAB — FOLATE: Folate: 12.6 ng/mL (ref >5.9)

## 2019-09-04 LAB — VITAMIN B12: Vitamin B-12: 190 pg/mL (ref 180–914)

## 2019-09-04 LAB — LACTATE DEHYDROGENASE: LDH: 135 U/L (ref 98–192)

## 2019-09-04 LAB — FERRITIN: Ferritin: 128 ng/mL (ref 11–307)

## 2019-09-04 LAB — IRON AND TIBC
Iron: 66 ug/dL (ref 28–170)
Saturation Ratios: 24 % (ref 10.4–31.8)
TIBC: 275 ug/dL (ref 250–450)
UIBC: 209 ug/dL

## 2019-09-04 NOTE — Progress Notes (Signed)
Orders placed for 1 unit PRBCs per Dr. Tomie China verbal order. Patient called and left a VM with tomorrow's appointment.

## 2019-09-04 NOTE — Progress Notes (Unsigned)
Critical value alert:   hgb 6.9  Dr. Delton Coombes aware, orders received for 1 unit of PRBC. We will call patient to schedule.

## 2019-09-04 NOTE — Progress Notes (Signed)
[facility] [address] [place] [address]   CLINIC:  Medical Oncology/Hematology  Patient Care Team: Pooler, Modena Nunnery, MD as PCP - General (Family Medicine) Herminio Commons, MD (Inactive) as PCP - Cardiology (Cardiology) Gala Romney Cristopher Estimable, MD as Consulting Physician (Gastroenterology)  CHIEF COMPLAINTS/PURPOSE OF CONSULTATION:  Anemia  HISTORY OF PRESENTING ILLNESS:  Kim Mills 79 y.o. female is here because of anemia, at the request of Dr. Buelah Manis. She is here today with her daughter.  Denies any bleeding per rectum or melena.  CBC on 09/07/2019 showed hemoglobin 6.9 with MCV of 88.3.  Reportedly refused blood transfusion.  She is mostly confined to wheelchair.  She had multiple falls in the past.  She currently lives with her daughter.  Denies any major shortness of breath on exertion or presyncopal episodes or dizziness.  Last colonoscopy was on 04/01/2014.  Denies any ice pica.  No fevers, night sweats or weight loss in the last 6 months.  She did have a history of occasional hematuria and is currently being treated for UTI.  She is also doing physical therapy 3 times a week.  Denies any prior history of blood transfusion.  Currently receiving B12 injections from her primary doctor.   MEDICAL HISTORY:  Past Medical History:  Diagnosis Date  . Arthritis   . Cataract   . Chest pain    Associated with weakness and fatigue  . Diabetes mellitus    A1c of 7.4 in 08/2010  . GERD (gastroesophageal reflux disease)   . Glaucoma   . Hyperlipidemia    Lipid profile in 08/2010:190, 121, 48, 118; normal CBC and CMet  . Hypertension    Lipid profile in 08/2010:190, 121, 48, 118; normal CBC and CMet  . Hypothyroidism   . Hypothyroidism   . Obesity     SURGICAL HISTORY: Past Surgical History:  Procedure Laterality Date  . APPENDECTOMY    . CHOLECYSTECTOMY    . COLONOSCOPY N/A 04/01/2014   RMR: Melanosis coli. colonic polyps removed as described above.     Marland Kitchen LEFT HEART CATH AND CORONARY ANGIOGRAPHY N/A 10/07/2017   Procedure: LEFT HEART CATH AND CORONARY ANGIOGRAPHY;  Surgeon: Troy Sine, MD;  Location: [facility];  Service: Cardiovascular;  Laterality: N/A;  . THYROIDECTOMY, PARTIAL      SOCIAL HISTORY: Social History   Socioeconomic History  . Marital status: Widowed    Spouse name: Not on file  . Number of children: 7  . Years of education: Not on file  . Highest education level: Not on file  Occupational History    Employer: RETIRED  Tobacco Use  . Smoking status: Never Smoker  . Smokeless tobacco: Never Used  Vaping Use  . Vaping Use: Never used  Substance and Sexual Activity  . Alcohol use: Never  . Drug use: Never  . Sexual activity: Not Currently  Other Topics Concern  . Not on file  Social History Narrative  . Not on file   Social Determinants of Health   Financial Resource Strain:   . Difficulty of Paying Living Expenses:   Food Insecurity:   . Worried About Charity fundraiser in the Last Year:   . Arboriculturist in the Last Year:   Transportation Needs:   . Film/video editor (Medical):   Marland Kitchen Lack of Transportation (Non-Medical):   Physical Activity:   . Days of Exercise per Week:   . Minutes of  Exercise per Session:   Stress:   . Feeling of Stress :   Social Connections:   . Frequency of Communication with Friends and Family:   . Frequency of Social Gatherings with Friends and Family:   . Attends Religious Services:   . Active Member of Clubs or Organizations:   . Attends Archivist Meetings:   Marland Kitchen Marital Status:   Intimate Partner Violence:   . Fear of Current or Ex-Partner:   . Emotionally Abused:   Marland Kitchen Physically Abused:   . Sexually Abused:     FAMILY HISTORY: Family History  Problem Relation Age of Onset  . Heart block Mother   . Uterine cancer Sister   . Diabetes Sister     ALLERGIES:  has No Known Allergies.  MEDICATIONS:  Current Outpatient Medications   Medication Sig Dispense Refill  . amLODipine (NORVASC) 10 MG tablet Take 1 tablet (10 mg total) by mouth daily. 90 tablet 3  . Blood Glucose Monitoring Suppl (BLOOD GLUCOSE SYSTEM PAK) KIT Please dispense based on patient and insurance preference. Use as directed to monitor FSBS 2x daily. Dx: E11.9. 1 each 1  . cyanocobalamin (,VITAMIN B-12,) 1000 MCG/ML injection Inject 1 mL (1,000 mcg total) into the muscle every 30 (thirty) days. 1 mL 0  . gabapentin (NEURONTIN) 300 MG capsule Take 1 capsule (300 mg total) by mouth at bedtime. 90 capsule 3  . Garlic (GARLIQUE) 659 MG TBEC Take 1 tablet by mouth every evening.     Marland Kitchen glipiZIDE (GLUCOTROL) 5 MG tablet Take 1 tablet (5 mg total) by mouth 2 (two) times daily. 180 tablet 3  . Glucose Blood (BLOOD GLUCOSE TEST STRIPS) STRP Please dispense based on patient and insurance preference. Use as directed to monitor FSBS 2x daily. Dx: E11.9. 100 each 11  . hydrochlorothiazide (MICROZIDE) 12.5 MG capsule TAKE ONE CAPSULE BY MOUTH DAILY 90 capsule 3  . Lancets MISC Please dispense based on patient and insurance preference. Use as directed to monitor FSBS 2x daily. Dx: E11.9. 100 each 11  . loratadine (CLARITIN) 10 MG tablet Take 1 tablet (10 mg total) by mouth daily. 30 tablet 0  . metFORMIN (GLUCOPHAGE) 1000 MG tablet TAKE ONE TABLET BY MOUTH TWICE A DAY 60 tablet 3  . metoprolol tartrate (LOPRESSOR) 25 MG tablet Take 0.5 tablets (12.5 mg total) by mouth 2 (two) times daily. 90 tablet 3  . Omega-3 Fatty Acids (OMEGA-3 FISH OIL PO) Take 1 capsule by mouth every evening.     Marland Kitchen omeprazole (PRILOSEC) 40 MG capsule TAKE ONE CAPSULE BY MOUTH DAILY 90 capsule 3  . simvastatin (ZOCOR) 40 MG tablet TAKE ONE TABLET ('40MG'$  TOTAL) BY MOUTH BEDTIME 90 tablet 3  . SYNTHROID 137 MCG tablet Take 1 tablet (137 mcg total) by mouth daily before breakfast. 30 tablet 3  . tolterodine (DETROL LA) 4 MG 24 hr capsule TAKE ONE CAPSULE BY MOUTH DAILY 90 capsule 3  . traMADol (ULTRAM) 50  MG tablet Take 1 tablet (50 mg total) by mouth 2 (two) times daily as needed. 45 tablet 1  . ferrous sulfate (SM IRON SLOW RELEASE) 160 (50 Fe) MG TBCR SR tablet Take 1 tablet (160 mg total) by mouth 2 (two) times daily with a meal. (Patient not taking: Reported on 09/04/2019) 60 tablet 11  . meclizine (ANTIVERT) 25 MG tablet Take 25 mg by mouth 3 (three) times daily as needed for dizziness. (Patient not taking: Reported on 09/04/2019)     Current Facility-Administered Medications  Medication Dose Route Frequency Provider Last Rate Last Admin  . cyanocobalamin ((VITAMIN B-12)) injection 1,000 mcg  1,000 mcg Intramuscular Q30 days Alycia Rossetti, MD   1,000 mcg at 08/17/19 1635    REVIEW OF SYSTEMS:   Review of Systems  Constitutional: Positive for diaphoresis (last dew nights).  HENT:  Negative.   Eyes: Negative.   Respiratory: Positive for shortness of breath (with exertion).   Cardiovascular: Negative.   Gastrointestinal: Positive for constipation.  Endocrine: Negative.   Genitourinary: Positive for hematuria (Recent UTI).   Musculoskeletal: Negative.   Skin: Negative.   Neurological: Positive for numbness (feet).  Hematological: Negative.   Psychiatric/Behavioral: Negative.   All other systems reviewed and are negative.    PHYSICAL EXAMINATION: ECOG PERFORMANCE STATUS: 2 - Symptomatic, <50% confined to bed  Vitals:   09/04/19 1333  BP: (!) 123/46  Pulse: 85  Resp: 18  Temp: (!) 97.1 F (36.2 C)  SpO2: 99%   Filed Weights   09/04/19 1333  Weight: 178 lb 1.6 oz (80.8 kg)   Physical Exam Vitals and nursing note reviewed.  Constitutional:      Appearance: Normal appearance.  HENT:     Mouth/Throat:     Mouth: Mucous membranes are moist.  Eyes:     Pupils: Pupils are equal, round, and reactive to light.  Cardiovascular:     Rate and Rhythm: Normal rate and regular rhythm.     Pulses: Normal pulses.     Heart sounds: Normal heart sounds.  Pulmonary:     Effort:  Pulmonary effort is normal.     Breath sounds: Normal breath sounds.  Abdominal:     Palpations: Abdomen is soft. There is no mass.     Tenderness: There is no abdominal tenderness.  Musculoskeletal:     Cervical back: Neck supple.     Right lower leg: Edema (trace) present.     Left lower leg: Edema (trace) present.  Neurological:     Mental Status: She is alert and oriented to person, place, and time.  Psychiatric:        Mood and Affect: Mood normal.        Behavior: Behavior normal.      LABORATORY DATA:  I have reviewed the data as listed Recent Results (from the past 2160 hour(s))  I-STAT creatinine     Status: None   Collection Time: 08/09/19  5:35 PM  Result Value Ref Range   Creatinine, Ser 1.00 0.44 - 1.00 mg/dL  Urinalysis, Routine w reflex microscopic     Status: Abnormal   Collection Time: 08/17/19  3:47 PM  Result Value Ref Range   Color, Urine YELLOW YELLOW   APPearance CLOUDY (A) CLEAR   Specific Gravity, Urine 1.025 1.001 - 1.03   pH 6.5 5.0 - 8.0   Glucose, UA NEGATIVE NEGATIVE   Bilirubin Urine NEGATIVE NEGATIVE   Ketones, ur NEGATIVE NEGATIVE   Hgb urine dipstick TRACE (A) NEGATIVE   Protein, ur 2+ (A) NEGATIVE   Nitrite NEGATIVE NEGATIVE   Leukocytes,Ua 2+ (A) NEGATIVE   WBC, UA 20-40 (A) 0 - 5 /HPF   RBC / HPF 0-2 0 - 2 /HPF   Squamous Epithelial / LPF 0-5 < OR = 5 /HPF   Bacteria, UA MANY (A) NONE SEEN /HPF   Hyaline Cast NONE SEEN NONE SEEN /LPF  TSH     Status: Abnormal   Collection Time: 08/17/19  3:47 PM  Result Value Ref Range  TSH 0.10 (L) 0.40 - 4.50 mIU/L  T3, free     Status: None   Collection Time: 08/17/19  3:47 PM  Result Value Ref Range   T3, Free 2.4 2.3 - 4.2 pg/mL  T4, free     Status: None   Collection Time: 08/17/19  3:47 PM  Result Value Ref Range   Free T4 1.6 0.8 - 1.8 ng/dL  CBC with Differential/Platelet     Status: Abnormal   Collection Time: 08/17/19  3:47 PM  Result Value Ref Range   WBC 9.3 3.8 - 10.8  Thousand/uL   RBC 2.81 (L) 3.80 - 5.10 Million/uL   Hemoglobin 7.5 (L) 11.7 - 15.5 g/dL   HCT 24.4 (L) 35 - 45 %   MCV 86.8 80.0 - 100.0 fL   MCH 26.7 (L) 27.0 - 33.0 pg   MCHC 30.7 (L) 32.0 - 36.0 g/dL   RDW 19.5 (H) 11.0 - 15.0 %   Platelets 276 140 - 400 Thousand/uL   MPV 10.5 7.5 - 12.5 fL   Neutro Abs 4,752 1,500 - 7,800 cells/uL   Lymphs Abs 3,767 850 - 3,900 cells/uL   Absolute Monocytes 512 200 - 950 cells/uL   Eosinophils Absolute 233 15 - 500 cells/uL   Basophils Absolute 37 0 - 200 cells/uL   Neutrophils Relative % 51.1 %   Total Lymphocyte 40.5 %   Monocytes Relative 5.5 %   Eosinophils Relative 2.5 %   Basophils Relative 0.4 %   Smear Review      Comment: Review of peripheral smear confirms automated results.   Comprehensive metabolic panel     Status: Abnormal   Collection Time: 08/17/19  3:47 PM  Result Value Ref Range   Glucose, Bld 212 (H) 65 - 99 mg/dL    Comment: .            Fasting reference interval . For someone without known diabetes, a glucose value >125 mg/dL indicates that they may have diabetes and this should be confirmed with a follow-up test. .    BUN 15 7 - 25 mg/dL   Creat 0.88 0.60 - 0.93 mg/dL    Comment: For patients >67 years of age, the reference limit for Creatinine is approximately 13% higher for people identified as African-American. .    BUN/Creatinine Ratio NOT APPLICABLE 6 - 22 (calc)   Sodium 141 135 - 146 mmol/L   Potassium 3.5 3.5 - 5.3 mmol/L   Chloride 106 98 - 110 mmol/L   CO2 26 20 - 32 mmol/L   Calcium 9.1 8.6 - 10.4 mg/dL   Total Protein 7.5 6.1 - 8.1 g/dL   Albumin 3.8 3.6 - 5.1 g/dL   Globulin 3.7 1.9 - 3.7 g/dL (calc)   AG Ratio 1.0 1.0 - 2.5 (calc)   Total Bilirubin 0.5 0.2 - 1.2 mg/dL   Alkaline phosphatase (APISO) 65 37 - 153 U/L   AST 15 10 - 35 U/L   ALT 12 6 - 29 U/L  Hemoglobin A1c     Status: Abnormal   Collection Time: 08/17/19  3:47 PM  Result Value Ref Range   Hgb A1c MFr Bld 6.9 (H) <5.7 %  of total Hgb    Comment: For someone without known diabetes, a hemoglobin A1c value of 6.5% or greater indicates that they may have  diabetes and this should be confirmed with a follow-up  test. . For someone with known diabetes, a value <7% indicates  that their diabetes is  well controlled and a value  greater than or equal to 7% indicates suboptimal  control. A1c targets should be individualized based on  duration of diabetes, age, comorbid conditions, and  other considerations. . Currently, no consensus exists regarding use of hemoglobin A1c for diagnosis of diabetes for children. .    Mean Plasma Glucose 151 (calc)   eAG (mmol/L) 8.4 (calc)  Microscopic Message     Status: None   Collection Time: 08/17/19  3:47 PM  Result Value Ref Range   Note      Comment: This urine was analyzed for the presence of WBC,  RBC, bacteria, casts, and other formed elements.  Only those elements seen were reported. . .   CBC with Differential/Platelet     Status: Abnormal   Collection Time: 08/21/19  2:57 PM  Result Value Ref Range   WBC 8.8 3.8 - 10.8 Thousand/uL   RBC 2.65 (L) 3.80 - 5.10 Million/uL   Hemoglobin 6.9 (L) 11.7 - 15.5 g/dL    Comment: Verified by repeat analysis. Marland Kitchen    HCT 23.4 (L) 35 - 45 %   MCV 88.3 80.0 - 100.0 fL   MCH 26.0 (L) 27.0 - 33.0 pg   MCHC 29.5 (L) 32.0 - 36.0 g/dL   RDW 19.6 (H) 11.0 - 15.0 %   Platelets 267 140 - 400 Thousand/uL   MPV 10.3 7.5 - 12.5 fL   Neutro Abs 4,224 1,500 - 7,800 cells/uL   Lymphs Abs 3,872 850 - 3,900 cells/uL   Absolute Monocytes 431 200 - 950 cells/uL   Eosinophils Absolute 246 15 - 500 cells/uL   Basophils Absolute 26 0 - 200 cells/uL   Neutrophils Relative % 48 %   Total Lymphocyte 44.0 %   Monocytes Relative 4.9 %   Eosinophils Relative 2.8 %   Basophils Relative 0.3 %  Iron, TIBC and Ferritin Panel     Status: None   Collection Time: 08/21/19  2:57 PM  Result Value Ref Range   Iron 66 45 - 160 mcg/dL   TIBC 269  250 - 450 mcg/dL (calc)   %SAT 25 16 - 45 % (calc)   Ferritin 131 16 - 288 ng/mL  POCT urinalysis dipstick     Status: Abnormal   Collection Time: 08/27/19  2:55 PM  Result Value Ref Range   Color, UA yellow    Clarity, UA clear    Glucose, UA Negative Negative   Bilirubin, UA neg    Ketones, UA neg    Spec Grav, UA <=1.005 (A) 1.010 - 1.025   Blood, UA neg    pH, UA 6.5 5.0 - 8.0   Protein, UA Negative Negative   Urobilinogen, UA 2.0 (A) 0.2 or 1.0 E.U./dL   Nitrite, UA neg    Leukocytes, UA Small (1+) (A) Negative   Appearance     Odor      RADIOGRAPHIC STUDIES: I have personally reviewed the radiological images as listed and agreed with the findings in the report. CT HEMATURIA WORKUP  Result Date: 08/10/2019 CLINICAL DATA:  Gross hematuria, 2 episodes in February and March of this year. EXAM: CT ABDOMEN AND PELVIS WITHOUT AND WITH CONTRAST TECHNIQUE: Multidetector CT imaging of the abdomen and pelvis was performed following the standard protocol before and following the bolus administration of intravenous contrast. CONTRAST:  16m OMNIPAQUE IOHEXOL 300 MG/ML  SOLN COMPARISON:  05/11/2019 FINDINGS: Lower chest: Incidental imaging of the lung bases is unremarkable. Hepatobiliary: Post cholecystectomy. No focal, suspicious  hepatic lesion. Portal vein is patent. No biliary duct dilation. Pancreas: Pancreas is normal without focal lesion or ductal dilation. Spleen: Spleen normal size without focal lesion. Adrenals/Urinary Tract: Adrenal glands are normal. Renal contours are smooth. No suspicious renal lesion. Mild thickening along the bladder base without nodular features though the bladder is under distended. No signs of abnormal enhancement along the course of the ureters. No nephrolithiasis. Urothelial thickening on the RIGHT. Column of Bertin noted in the interpolar RIGHT kidney. Caliceal elements in this area appear irregular and or truncated best seen on image 67 of series 15 and 65 of  series 15 in the setting of urothelial thickening involving infundibular elements and the renal pelvis of the RIGHT kidney. Stomach/Bowel: No acute gastric or small bowel process. Stomach is under distended limiting assessment. Stool fills much of the colon. Vascular/Lymphatic: Calcified and noncalcified plaque of the abdominal aorta. No aneurysmal dilation. No adenopathy. No pelvic lymphadenopathy. Reproductive: Uterus with calcified leiomyomata in the uterine body. Other: Small fat containing umbilical hernia. No abdominal or pelvic ascites. Musculoskeletal: Spinal degenerative changes. No acute or destructive bone process. IMPRESSION: 1. Irregular appearance of interpolar collecting systems and urothelium of the renal pelvis. While inflammation could be an explanation for these findings in the setting of painless gross hematuria upper tract lesion is considered. There is also a column of Bertin extending into the renal pelvis which confound slightly the examination ureteroscopic assessment may be helpful. 2. Mild circumferential thickening of the urinary bladder perhaps slightly more prominent along the bladder base. This could be assessed at cystoscopy but is likely due to under distension. 3. No nephrolithiasis or suspicious renal lesion. 4. Post cholecystectomy. 5. Aortic atherosclerosis. Aortic Atherosclerosis (ICD10-I70.0). Electronically Signed   By: Zetta Bills M.D.   On: 08/10/2019 13:54    ASSESSMENT:  1.  Normocytic anemia: -Patient seen at the request of Dr. Buelah Manis for further work-up and management of normocytic anemia. -Recent CBC on 08/21/2019 shows hemoglobin 6.9 with MCV of 88.3.  White count and platelets are normal.  No history of CKD.  Denies any bleeding per rectum or melena. Last colonoscopy on 04/01/2014 shows diffusely pigmented rectal mucosa consistent with melanosis [name]  Diffusely pigmented colonic mucosa, 2 diminutive polyps in the mid ascending  segment, otherwise the remainder of the colon mucosa was normal.  Biopsy consistent with tubular adenoma. -CT renal study on 05/11/2019 shows normal-sized spleen with normal liver.  No other abnormal adenopathy. -Denies any prior history of blood transfusion.  She is currently taking iron tablet twice daily.   PLAN:  1.  Normocytic anemia: -Will evaluate for nutritional deficiencies including ferritin, iron panel, Q19 and folic acid.  We will check for methylmalonic acid and copper levels.  We will rule out hemolysis by checking reticulocyte count, LDH, direct Coombs test.  We will also check stool for occult blood.  Will evaluate for plasma cell disorder by checking SPEP. -A CBC today shows a hemoglobin of 6.9 with MCV of 90.  I have recommended 1 unit of blood transfusion.  Patient was initially reluctant but later agreed.  We will plan for transfusion tomorrow.  2.  Diabetes: -Continue glipizide 5 mg twice daily, Metformin 1000 mg twice daily.  3.  Hypertension: -Continue amlodipine 10 mg daily, hydrochlorothiazide 12.5 mg daily.  4.  Hypothyroidism: -Continue Synthroid grams daily.  5.  B12 deficiency: -She is receiving B12 1000 mcg every 30 days by injection.   All questions were answered. The patient  knows to call the clinic with any problems, questions or concerns.   Derek Jack, MD 09/04/19 1:56 PM  [place] [phone]   I, Jacqualyn Posey, am acting as a scribe for Dr. Sanda Linger.  I, Derek Jack MD, have reviewed the above documentation for accuracy and completeness, and I agree with the above.

## 2019-09-04 NOTE — Patient Instructions (Signed)
[place] at [facility] Discharge Instructions  You were seen today by Dr. Delton Coombes. He went over your recent results. Dr. Delton Coombes will see you back in 1 week for follow up.   Thank you for choosing [place] at [facility] to provide your oncology and hematology care.  To afford each patient quality time with our provider, please arrive at least 15 minutes before your scheduled appointment time.   If you have a lab appointment with the [facility] please come in thru the Main Entrance and check in at the main information desk  You need to re-schedule your appointment should you arrive 10 or more minutes late.  We strive to give you quality time with our providers, and arriving late affects you and other patients whose appointments are after yours.  Also, if you no show three or more times for appointments you may be dismissed from the clinic at the providers discretion.     Again, thank you for choosing [facility].  Our hope is that these requests will decrease the amount of time that you wait before being seen by our physicians.       _____________________________________________________________  Should you have questions after your visit to [facility], please contact our office at [phone] between the hours of 8:00 a.m. and 4:30 p.m.  Voicemails left after 4:00 p.m. will not be returned until the following business day.  For prescription refill requests, have your pharmacy contact our office and allow 72 hours.    Cancer Center Support Programs:   > Cancer Support Group  2nd Tuesday of the month 1pm-2pm, Journey Room

## 2019-09-05 ENCOUNTER — Other Ambulatory Visit (HOSPITAL_COMMUNITY): Payer: Medicare Other

## 2019-09-05 ENCOUNTER — Other Ambulatory Visit (HOSPITAL_COMMUNITY): Payer: Self-pay

## 2019-09-05 ENCOUNTER — Telehealth: Payer: Self-pay

## 2019-09-05 ENCOUNTER — Inpatient Hospital Stay (HOSPITAL_COMMUNITY): Payer: Medicare Other

## 2019-09-05 ENCOUNTER — Encounter (HOSPITAL_COMMUNITY): Payer: Self-pay

## 2019-09-05 ENCOUNTER — Ambulatory Visit (HOSPITAL_COMMUNITY): Payer: Medicare Other

## 2019-09-05 DIAGNOSIS — R0609 Other forms of dyspnea: Secondary | ICD-10-CM | POA: Diagnosis not present

## 2019-09-05 DIAGNOSIS — I10 Essential (primary) hypertension: Secondary | ICD-10-CM | POA: Diagnosis not present

## 2019-09-05 DIAGNOSIS — E119 Type 2 diabetes mellitus without complications: Secondary | ICD-10-CM | POA: Diagnosis not present

## 2019-09-05 DIAGNOSIS — D649 Anemia, unspecified: Secondary | ICD-10-CM | POA: Diagnosis not present

## 2019-09-05 DIAGNOSIS — E538 Deficiency of other specified B group vitamins: Secondary | ICD-10-CM | POA: Diagnosis not present

## 2019-09-05 DIAGNOSIS — E039 Hypothyroidism, unspecified: Secondary | ICD-10-CM | POA: Diagnosis not present

## 2019-09-05 LAB — PROTEIN ELECTROPHORESIS, SERUM
A/G Ratio: 0.7 (ref 0.7–1.7)
Albumin ELP: 3.2 g/dL (ref 2.9–4.4)
Alpha-1-Globulin: 0.3 g/dL (ref 0.0–0.4)
Alpha-2-Globulin: 0.8 g/dL (ref 0.4–1.0)
Beta Globulin: 1 g/dL (ref 0.7–1.3)
Gamma Globulin: 2.4 g/dL — ABNORMAL HIGH (ref 0.4–1.8)
Globulin, Total: 4.4 g/dL — ABNORMAL HIGH (ref 2.2–3.9)
M-Spike, %: 2.1 g/dL — ABNORMAL HIGH
Total Protein ELP: 7.6 g/dL (ref 6.0–8.5)

## 2019-09-05 LAB — PREPARE RBC (CROSSMATCH)

## 2019-09-05 LAB — ABO/RH: ABO/RH(D): B POS

## 2019-09-05 NOTE — Telephone Encounter (Signed)
Submitted VOB, Monovisc, bilateral knee. 

## 2019-09-05 NOTE — Progress Notes (Signed)
Patient returned my phone call stating that she does not want blood at this time. I spoke with patient's daughter, Kim Mills, who states that the patient is willing to receive blood but will be unable to make the appt today. Daughter connected to our schedulers to reschedule.

## 2019-09-06 ENCOUNTER — Encounter: Payer: Self-pay | Admitting: Orthopedic Surgery

## 2019-09-06 ENCOUNTER — Encounter (HOSPITAL_COMMUNITY): Payer: Self-pay

## 2019-09-06 ENCOUNTER — Inpatient Hospital Stay (HOSPITAL_COMMUNITY): Payer: Medicare Other

## 2019-09-06 VITALS — BP 121/73 | HR 70 | Temp 97.3°F | Resp 18

## 2019-09-06 DIAGNOSIS — E119 Type 2 diabetes mellitus without complications: Secondary | ICD-10-CM | POA: Diagnosis not present

## 2019-09-06 DIAGNOSIS — D649 Anemia, unspecified: Secondary | ICD-10-CM | POA: Diagnosis not present

## 2019-09-06 DIAGNOSIS — E538 Deficiency of other specified B group vitamins: Secondary | ICD-10-CM | POA: Diagnosis not present

## 2019-09-06 DIAGNOSIS — R0609 Other forms of dyspnea: Secondary | ICD-10-CM | POA: Diagnosis not present

## 2019-09-06 DIAGNOSIS — I10 Essential (primary) hypertension: Secondary | ICD-10-CM | POA: Diagnosis not present

## 2019-09-06 DIAGNOSIS — D500 Iron deficiency anemia secondary to blood loss (chronic): Secondary | ICD-10-CM

## 2019-09-06 DIAGNOSIS — E039 Hypothyroidism, unspecified: Secondary | ICD-10-CM | POA: Diagnosis not present

## 2019-09-06 LAB — METHYLMALONIC ACID, SERUM: Methylmalonic Acid, Quantitative: 143 nmol/L (ref 0–378)

## 2019-09-06 LAB — COPPER, SERUM: Copper: 121 ug/dL (ref 80–158)

## 2019-09-06 MED ORDER — SODIUM CHLORIDE 0.9% IV SOLUTION
250.0000 mL | Freq: Once | INTRAVENOUS | Status: AC
  Administered 2019-09-06: 250 mL via INTRAVENOUS

## 2019-09-06 MED ORDER — ACETAMINOPHEN 325 MG PO TABS
650.0000 mg | ORAL_TABLET | Freq: Once | ORAL | Status: AC
  Administered 2019-09-06: 650 mg via ORAL
  Filled 2019-09-06: qty 2

## 2019-09-06 MED ORDER — DIPHENHYDRAMINE HCL 25 MG PO CAPS
25.0000 mg | ORAL_CAPSULE | Freq: Once | ORAL | Status: AC
  Administered 2019-09-06: 25 mg via ORAL
  Filled 2019-09-06: qty 1

## 2019-09-06 MED ORDER — ONDANSETRON HCL 4 MG PO TABS
4.0000 mg | ORAL_TABLET | Freq: Once | ORAL | Status: AC
  Administered 2019-09-06: 4 mg via ORAL

## 2019-09-06 MED ORDER — ONDANSETRON HCL 4 MG PO TABS
ORAL_TABLET | ORAL | Status: AC
  Filled 2019-09-06: qty 1

## 2019-09-06 MED ORDER — SODIUM CHLORIDE 0.9% FLUSH: 10.0000 mL | INTRAVENOUS | Status: DC | PRN

## 2019-09-06 NOTE — Patient Instructions (Addendum)
Stool for Occult Blood Test Why am I having this test? Stool for occult blood, or fecal occult blood test (FOBT), is a test that is used to check for gastrointestinal (GI) bleeding, which may be a sign of colon cancer. This test can also detect small amounts of blood in your stool (feces) from other causes, such as ulcers, bleeding blood vessels, or hemorrhoids. This test may be done as part of an annual routine exam to screen for colorectal cancer. Screening is recommended for all adults starting at age 83 and continuing until age 84. Your health care provider may recommend screening at age 55. What is being tested? This test checks for blood in your stool. What kind of sample is taken?   A stool sample is required for this test. Your health care provider may collect the sample with a swab of the rectum. Or, you may be instructed to collect the sample in a container at home. If you are instructed to collect the sample at home, your health care provider will give you the instructions and supplies that you will need. How do I collect samples at home? You may be asked to collect stool samples at home. Follow instructions from your health care provider about how to collect the samples. When collecting a stool sample at home, make sure you:  Use supplies and instructions that you received from the lab.  Have a bowel movement directly into a clean, dry container. Do not collect stool from the water in the toilet.  Transfer the sample into the germ-free (sterile) cup that you received from the lab.  Do not let any toilet paper or urine get into the cup.  Wash your hands with soap and water after collecting the sample.  Return the samples to the lab as instructed. How do I prepare for this test?  Do not eat any red meat during the 3 days before your test.  Follow instructions from your health care provider about eating and drinking before the test. Your health care provider may instruct you to  avoid other foods or substances. Tell a health care provider about:  All medicines you are taking, including vitamins, herbs, eye drops, creams, and over-the-counter medicines. You may be instructed to avoid certain medicines that are known to interfere with this test.  Any recent dental procedures you have had. How are the results reported? Your test results will be reported as either positive or negative for blood in your stool. What do the results mean? A negative test result means that there is no blood within the stool. A negative result is considered normal. A positive test result may mean that there is blood in the stool. Causes of blood in the stool include:  GI tumors.  Certain GI diseases.  GI trauma or recent surgery.  Hemorrhoids. If your test result is positive, additional tests may be needed to find the source of the bleeding. Talk with your health care provider about what your results mean. Questions to ask your health care provider Ask your health care provider, or the department that is doing the test:  When will my results be ready?  How will I get my results?  What are my treatment options?  What other tests do I need?  What are my next steps? Summary  Stool for occult blood, or fecal occult blood test (FOBT), is a test that is used to check for gastrointestinal (GI) bleeding, which may be a sign of colon cancer.  This  test can also detect small amounts of blood in your stool (feces) from other causes, such as ulcers, bleeding blood vessels, or hemorrhoids.  Your health care provider may collect the sample with a swab of the rectum. Or, you may be instructed to collect the sample in a container at home.  A positive test result may mean that there is blood in the stool. This information is not intended to replace advice given to you by your health care provider. Make sure you discuss any questions you have with your health care provider. Document Revised:  05/18/2018 Document Reviewed: 09/21/2016 Elsevier Patient Education  2020 [place] [facility]  Discharge Instructions:   _______________________________________________________________  Thank you for choosing [place] [facility] to provide your oncology and hematology care.  To afford each patient quality time with our providers, please arrive at least 15 minutes before your scheduled appointment.  You need to re-schedule your appointment if you arrive 10 or more minutes late.  We strive to give you quality time with our providers, and arriving late affects you and other patients whose appointments are after yours.  Also, if you no show three or more times for appointments you may be dismissed from the clinic.  Again, thank you for choosing [place] [facility] [place] hope is that these requests will allow you access to exceptional care and in a timely manner. _______________________________________________________________  If you have questions after your visit, please contact our office at [phone] between the hours of 8:30 a.m. and 5:00 p.m. Voicemails left after 4:30 p.m. will not be returned until the following business day. _______________________________________________________________  For prescription refill requests, have your pharmacy contact our office. _______________________________________________________________  Recommendations made by the consultant and any test results will be sent to your referring physician. _______________________________________________________________

## 2019-09-06 NOTE — Progress Notes (Signed)
Patient presents today for 1 Unit of PRBC's. Vital signs stable. Patient has no complaints of any changes since her last visit. Blood administration consent obtained. MAR reviewed and updated.   Verbal order received from RLockamy NP to give patient 4mg  Zofran PO x 1 dose now.   Feraheme given today per MD orders. Tolerated infusion without adverse affects. Vital signs stable. No complaints at this time. Discharged from clinic via wheel chair. F/U with [facility] as scheduled.

## 2019-09-06 NOTE — Progress Notes (Signed)
Office Visit Note   Patient: Kim Mills           Date of Birth: 18-May-1940           MRN: [id number] Visit Date: 09/03/2019 Requested by: Alycia Rossetti, MD [address] [place],  [place] [address] PCP: Alycia Rossetti, MD  Subjective: Chief Complaint  Patient presents with  . Left Knee - Pain  . Right Knee - Pain    HPI: Kim Mills is a 79 y.o. female who presents to the office complaining of bilateral knee pain.  She reports that her left knee bothers her more than her right knee.  Pain comes and goes.  She is only able to stand for short periods of time due to knee pain.  Pain is waking her up at night.  She uses a walker to ambulate.  She has no history of knee surgery or any recent injury.  She does have a history of diabetes but cannot recall her last A1c.  Previous cortisone injections have given her about 3 to 4 weeks of relief.  She denies any groin pain or radicular pain..                ROS: All systems reviewed are negative as they relate to the chief complaint within the history of present illness.  Patient denies fevers or chills.  Assessment & Plan: Visit Diagnoses:  1. Unilateral primary osteoarthritis, left knee   2. Unilateral primary osteoarthritis, right knee     Plan: Patient is a 79 year old female presents complaint of bilateral knee pain.  She has history of bilateral knee osteoarthritis.  Left knee is bothering her more today.  She wants to try gel injections but we will preapproved her for those and cannot do them today.  The meantime we will proceed with left knee cortisone injection.  We cannot do both knees due to her history of diabetes.  Patient agreed with this plan.  She tolerated procedure well.  Follow-up after approval of gel injections.  This patient is diagnosed with osteoarthritis of the knee(s).    Radiographs show evidence of joint space narrowing, osteophytes, subchondral sclerosis and/or subchondral cysts.  This patient  has knee pain which interferes with functional and activities of daily living.    This patient has experienced inadequate response, adverse effects and/or intolerance with conservative treatments such as acetaminophen, NSAIDS, topical creams, physical therapy or regular exercise, knee bracing and/or weight loss.   This patient has experienced inadequate response or has a contraindication to intra articular steroid injections for at least 3 months.   This patient is not scheduled to have a total knee replacement within 6 months of starting treatment with viscosupplementation.   Follow-Up Instructions: No follow-ups on file.   Orders:  No orders of the defined types were placed in this encounter.  No orders of the defined types were placed in this encounter.     Procedures: Large Joint Inj: L knee on 09/09/2019 8:02 PM Indications: diagnostic evaluation, joint swelling and pain Details: 18 G 1.5 in needle, superolateral approach  Arthrogram: No  Medications: 5 mL lidocaine 1 %; 40 mg methylPREDNISolone acetate 40 MG/ML; 4 mL bupivacaine 0.25 % Outcome: tolerated well, no immediate complications Procedure, treatment alternatives, risks and benefits explained, specific risks discussed. Consent was given by the patient. Immediately prior to procedure a time out was called to verify the correct patient, procedure, equipment, support staff and site/side marked as  required. Patient was prepped and draped in the usual sterile fashion.       Clinical Data: No additional findings.  Objective: Vital Signs: There were no vitals taken for this visit.  Physical Exam:  Constitutional: Patient appears well-developed HEENT:  Head: Normocephalic Eyes:EOM are normal Neck: Normal range of motion Cardiovascular: Normal rate Pulmonary/chest: Effort normal Neurologic: Patient is alert Skin: Skin is warm Psychiatric: Patient has normal mood and affect  Ortho Exam: Ortho exam demonstrates  intact extensor mechanism.  No pain with hip range of motion.  Tenderness to palpation over the medial lateral joint lines of the left and right knees.  No effusion of either knee.  No significant flexion contracture noted.  Flexes to greater than 90 degrees.  Specialty Comments:  No specialty comments available.  Imaging: No results found.   PMFS History: Patient Active Problem List   Diagnosis Date Noted  . Gross hematuria 07/02/2019  . CKD (chronic kidney disease) stage 3, GFR 30-59 ml/min 04/18/2019  . Vitamin D deficiency 04/17/2019  . B12 deficiency 04/12/2018  . Normocytic anemia 04/12/2018  . Carotid artery disease without cerebral infarction ([place]) 04/12/2018  . Gait disorder 01/11/2018  . Diabetic neuropathy ([place]) 01/10/2018  . Bilateral carotid bruits 11/28/2017  . CAD (coronary artery disease) 11/23/2017  . Elevated troponin   . SVT (supraventricular tachycardia) ([place]) 10/05/2017  . History of colonic polyps   . Encounter for screening colonoscopy 03/13/2014  . Constipation 03/13/2014  . GERD (gastroesophageal reflux disease) 03/13/2014  . Hyperlipidemia   . Type 2 diabetes mellitus ([place])   . OA (osteoarthritis) of knee   . Hypothyroidism   . Chest pain   . Obesity   . Hypertension    Past Medical History:  Diagnosis Date  . Arthritis   . Cataract   . Chest pain    Associated with weakness and fatigue  . Diabetes mellitus    A1c of 7.4 in 08/2010  . GERD (gastroesophageal reflux disease)   . Glaucoma   . Hyperlipidemia    Lipid profile in 08/2010:190, 121, 48, 118; normal CBC and CMet  . Hypertension    Lipid profile in 08/2010:190, 121, 48, 118; normal CBC and CMet  . Hypothyroidism   . Hypothyroidism   . Obesity     Family History  Problem Relation Age of Onset  . Anemia Father   . Arthritis Mother   . Stroke Brother   . Cancer Sister        unknown kind  . Diabetes Sister   . Dementia Sister   . Cancer Niece        Breast  . Cancer Brother     . Healthy Son   . Healthy Son   . Healthy Son   . Healthy Daughter   . Healthy Daughter   . Healthy Daughter   . Sarcoidosis Daughter     Past Surgical History:  Procedure Laterality Date  . CHOLECYSTECTOMY    . COLONOSCOPY N/A 04/01/2014   RMR: Melanosis coli. colonic polyps removed as described above.   Marland Kitchen LEFT HEART CATH AND CORONARY ANGIOGRAPHY N/A 10/07/2017   Procedure: LEFT HEART CATH AND CORONARY ANGIOGRAPHY;  Surgeon: Troy Sine, MD;  Location: [facility];  Service: Cardiovascular;  Laterality: N/A;  . THYROIDECTOMY, PARTIAL     Social History   Occupational History    Employer: RETIRED  Tobacco Use  . Smoking status: Passive Smoke Exposure - Never Smoker  . Smokeless tobacco: Never  Used  Vaping Use  . Vaping Use: Never used  Substance and Sexual Activity  . Alcohol use: Never  . Drug use: Never  . Sexual activity: Not Currently

## 2019-09-07 ENCOUNTER — Telehealth: Payer: Self-pay

## 2019-09-07 LAB — TYPE AND SCREEN
ABO/RH(D): B POS
Antibody Screen: NEGATIVE
Unit division: 0

## 2019-09-07 LAB — BPAM RBC
Blood Product Expiration Date: 202109022359
ISSUE DATE / TIME: 202107291100
Unit Type and Rh: 1700

## 2019-09-07 NOTE — Telephone Encounter (Signed)
Approved, [place], bilateral knee. [place] deductible has been met Patient will be responsible for 20% OOP. No Co-pay No PA required

## 2019-09-09 DIAGNOSIS — M1712 Unilateral primary osteoarthritis, left knee: Secondary | ICD-10-CM

## 2019-09-09 MED ORDER — METHYLPREDNISOLONE ACETATE 40 MG/ML IJ SUSP
40.0000 mg | INTRAMUSCULAR | Status: AC | PRN
  Administered 2019-09-09: 40 mg via INTRA_ARTICULAR

## 2019-09-09 MED ORDER — BUPIVACAINE HCL 0.25 % IJ SOLN
4.0000 mL | INTRAMUSCULAR | Status: AC | PRN
  Administered 2019-09-09: 4 mL via INTRA_ARTICULAR

## 2019-09-09 MED ORDER — LIDOCAINE HCL 1 % IJ SOLN
5.0000 mL | INTRAMUSCULAR | Status: AC | PRN
  Administered 2019-09-09: 5 mL

## 2019-09-10 ENCOUNTER — Other Ambulatory Visit (HOSPITAL_COMMUNITY): Payer: Self-pay | Admitting: *Deleted

## 2019-09-10 DIAGNOSIS — D500 Iron deficiency anemia secondary to blood loss (chronic): Secondary | ICD-10-CM

## 2019-09-10 DIAGNOSIS — D649 Anemia, unspecified: Secondary | ICD-10-CM

## 2019-09-10 LAB — OCCULT BLOOD X 1 CARD TO LAB, STOOL
Fecal Occult Bld: NEGATIVE
Fecal Occult Bld: NEGATIVE
Fecal Occult Bld: NEGATIVE

## 2019-09-10 NOTE — Patient Instructions (Signed)
[place]  09/10/2019     @PREFPERIOPPHARMACY @   Your procedure is scheduled on  09/13/2019.  Report to [facility] at  1230  P.M.  Call this number if you have problems the morning of surgery:  [phone]   Remember:  Do not eat or drink after midnight.                        Take these medicines the morning of surgery with A SIP OF WATER  Amlodipine, gabapentin, antivert(if needed), metoprolol, prilosec, synthroid(if needed), tramadol(if needed).    Do not wear jewelry, make-up or nail polish.  Do not wear lotions, powders, or perfumes. Please wear deodorant and brush your teeth.  Do not shave 48 hours prior to surgery.  Men may shave face and neck.  Do not bring valuables to the hospital.  [facility] is not responsible for any belongings or valuables.  Contacts, dentures or bridgework may not be worn into surgery.  Leave your suitcase in the car.  After surgery it may be brought to your room.  For patients admitted to the hospital, discharge time will be determined by your treatment team.  Patients discharged the day of surgery will not be allowed to drive home.   Name and phone number of your driver:   family Special instructions:  DO NOT smoke the morning of your procedure.  Please read over the following fact sheets that you were given. Anesthesia Post-op Instructions and Care and Recovery After Surgery       Cystoscopy Cystoscopy is a procedure that is used to help diagnose and sometimes treat conditions that affect the lower urinary tract. The lower urinary tract includes the bladder and the urethra. The urethra is the tube that drains urine from the bladder. Cystoscopy is done using a thin, tube-shaped instrument with a light and camera at the end (cystoscope). The cystoscope may be hard or flexible, depending on the goal of the procedure. The cystoscope is inserted through the urethra, into the bladder. Cystoscopy may be recommended if you  have:  Urinary tract infections that keep coming back.  Blood in the urine (hematuria).  An inability to control when you urinate (urinary incontinence) or an overactive bladder.  Unusual cells found in a urine sample.  A blockage in the urethra, such as a urinary stone.  Painful urination.  An abnormality in the bladder found during an intravenous pyelogram (IVP) or CT scan. Cystoscopy may also be done to remove a sample of tissue to be examined under a microscope (biopsy). Tell a health care provider about:  Any allergies you have.  All medicines you are taking, including vitamins, herbs, eye drops, creams, and over-the-counter medicines.  Any problems you or family members have had with anesthetic medicines.  Any blood disorders you have.  Any surgeries you have had.  Any medical conditions you have.  Whether you are pregnant or may be pregnant. What are the risks? Generally, this is a safe procedure. However, problems may occur, including:  Infection.  Bleeding.  Allergic reactions to medicines.  Damage to other structures or organs. What happens before the procedure?  Ask your health care provider about: ? Changing or stopping your regular medicines. This is especially important if you are taking diabetes medicines or blood thinners. ? Taking medicines such as aspirin and ibuprofen. These medicines can thin your blood. Do not take these medicines unless your  health care provider tells you to take them. ? Taking over-the-counter medicines, vitamins, herbs, and supplements.  Follow instructions from your health care provider about eating or drinking restrictions.  Ask your health care provider what steps will be taken to help prevent infection. These may include: ? Washing skin with a germ-killing soap. ? Taking antibiotic medicine.  You may have an exam or testing, such as: ? X-rays of the bladder, urethra, or kidneys. ? Urine tests to check for signs of  infection.  Plan to have someone take you home from the hospital or clinic. What happens during the procedure?   You will be given one or more of the following: ? A medicine to help you relax (sedative). ? A medicine to numb the area (local anesthetic).  The area around the opening of your urethra will be cleaned.  The cystoscope will be passed through your urethra into your bladder.  Germ-free (sterile) fluid will flow through the cystoscope to fill your bladder. The fluid will stretch your bladder so that your health care provider can clearly examine your bladder walls.  Your doctor will look at the urethra and bladder. Your doctor may take a biopsy or remove stones.  The cystoscope will be removed, and your bladder will be emptied. The procedure may vary among health care providers and hospitals. What can I expect after the procedure? After the procedure, it is common to have:  Some soreness or pain in your abdomen and urethra.  Urinary symptoms. These include: ? Mild pain or burning when you urinate. Pain should stop within a few minutes after you urinate. This may last for up to 1 week. ? A small amount of blood in your urine for several days. ? Feeling like you need to urinate but producing only a small amount of urine. Follow these instructions at home: Medicines  Take over-the-counter and prescription medicines only as told by your health care provider.  If you were prescribed an antibiotic medicine, take it as told by your health care provider. Do not stop taking the antibiotic even if you start to feel better. General instructions  Return to your normal activities as told by your health care provider. Ask your health care provider what activities are safe for you.  Do not drive for 24 hours if you were given a sedative during your procedure.  Watch for any blood in your urine. If the amount of blood in your urine increases, call your health care provider.  Follow  instructions from your health care provider about eating or drinking restrictions.  If a tissue sample was removed for testing (biopsy) during your procedure, it is up to you to get your test results. Ask your health care provider, or the department that is doing the test, when your results will be ready.  Drink enough fluid to keep your urine pale yellow.  Keep all follow-up visits as told by your health care provider. This is important. Contact a health care provider if you:  Have pain that gets worse or does not get better with medicine, especially pain when you urinate.  Have trouble urinating.  Have more blood in your urine. Get help right away if you:  Have blood clots in your urine.  Have abdominal pain.  Have a fever or chills.  Are unable to urinate. Summary  Cystoscopy is a procedure that is used to help diagnose and sometimes treat conditions that affect the lower urinary tract.  Cystoscopy is done using a thin,  tube-shaped instrument with a light and camera at the end.  After the procedure, it is common to have some soreness or pain in your abdomen and urethra.  Watch for any blood in your urine. If the amount of blood in your urine increases, call your health care provider.  If you were prescribed an antibiotic medicine, take it as told by your health care provider. Do not stop taking the antibiotic even if you start to feel better. This information is not intended to replace advice given to you by your health care provider. Make sure you discuss any questions you have with your health care provider. Document Revised: 01/17/2018 Document Reviewed: 01/17/2018 Elsevier Patient Education  [place].  Ureteral Stent Implantation, Care After This sheet gives you information about how to care for yourself after your procedure. Your health care provider may also give you more specific instructions. If you have problems or questions, contact your health care  provider. What can I expect after the procedure? After the procedure, it is common to have:  Nausea.  Mild pain when you urinate. You may feel this pain in your lower back or lower abdomen. The pain should stop within a few minutes after you urinate. This may last for up to 1 week.  A small amount of blood in your urine for several days. Follow these instructions at home: Medicines  Take over-the-counter and prescription medicines only as told by your health care provider.  If you were prescribed an antibiotic medicine, take it as told by your health care provider. Do not stop taking the antibiotic even if you start to feel better.  Do not drive for 24 hours if you were given a sedative during your procedure.  Ask your health care provider if the medicine prescribed to you requires you to avoid driving or using heavy machinery. Activity  Rest as told by your health care provider.  Avoid sitting for a long time without moving. Get up to take short walks every 1-2 hours. This is important to improve blood flow and breathing. Ask for help if you feel weak or unsteady.  Return to your normal activities as told by your health care provider. Ask your health care provider what activities are safe for you. General instructions   Watch for any blood in your urine. Call your health care provider if the amount of blood in your urine increases.  If you have a catheter: ? Follow instructions from your health care provider about taking care of your catheter and collection bag. ? Do not take baths, swim, or use a hot tub until your health care provider approves. Ask your health care provider if you may take showers. You may only be allowed to take sponge baths.  Drink enough fluid to keep your urine pale yellow.  Do not use any products that contain nicotine or tobacco, such as cigarettes, e-cigarettes, and chewing tobacco. These can delay healing after surgery. If you need help quitting, ask  your health care provider.  Keep all follow-up visits as told by your health care provider. This is important. Contact a health care provider if:  You have pain that gets worse or does not get better with medicine, especially pain when you urinate.  You have difficulty urinating.  You feel nauseous or you vomit repeatedly during a period of more than 2 days after the procedure. Get help right away if:  Your urine is dark red or has blood clots in it.  You  are leaking urine (have incontinence).  The end of the stent comes out of your urethra.  You cannot urinate.  You have sudden, sharp, or severe pain in your abdomen or lower back.  You have a fever.  You have swelling or pain in your legs.  You have difficulty breathing. Summary  After the procedure, it is common to have mild pain when you urinate that goes away within a few minutes after you urinate. This may last for up to 1 week.  Watch for any blood in your urine. Call your health care provider if the amount of blood in your urine increases.  Take over-the-counter and prescription medicines only as told by your health care provider.  Drink enough fluid to keep your urine pale yellow. This information is not intended to replace advice given to you by your health care provider. Make sure you discuss any questions you have with your health care provider. Document Revised: 11/01/2017 Document Reviewed: 11/02/2017 Elsevier Patient Education  2020 [place] Anesthesia, Adult, Care After This sheet gives you information about how to care for yourself after your procedure. Your health care provider may also give you more specific instructions. If you have problems or questions, contact your health care provider. What can I expect after the procedure? After the procedure, the following side effects are common:  Pain or discomfort at the IV site.  Nausea.  Vomiting.  Sore throat.  Trouble  concentrating.  Feeling cold or chills.  Weak or tired.  Sleepiness and fatigue.  Soreness and body aches. These side effects can affect parts of the body that were not involved in surgery. Follow these instructions at home:  For at least 24 hours after the procedure:  Have a responsible adult stay with you. It is important to have someone help care for you until you are awake and alert.  Rest as needed.  Do not: ? Participate in activities in which you could fall or become injured. ? Drive. ? Use heavy machinery. ? Drink alcohol. ? Take sleeping pills or medicines that cause drowsiness. ? Make important decisions or sign legal documents. ? Take care of children on your own. Eating and drinking  Follow any instructions from your health care provider about eating or drinking restrictions.  When you feel hungry, start by eating small amounts of foods that are soft and easy to digest (bland), such as toast. Gradually return to your regular diet.  Drink enough fluid to keep your urine pale yellow.  If you vomit, rehydrate by drinking water, juice, or clear broth. General instructions  If you have sleep apnea, surgery and certain medicines can increase your risk for breathing problems. Follow instructions from your health care provider about wearing your sleep device: ? Anytime you are sleeping, including during daytime naps. ? While taking prescription pain medicines, sleeping medicines, or medicines that make you drowsy.  Return to your normal activities as told by your health care provider. Ask your health care provider what activities are safe for you.  Take over-the-counter and prescription medicines only as told by your health care provider.  If you smoke, do not smoke without supervision.  Keep all follow-up visits as told by your health care provider. This is important. Contact a health care provider if:  You have nausea or vomiting that does not get better with  medicine.  You cannot eat or drink without vomiting.  You have pain that does not get better with medicine.  You  are unable to pass urine.  You develop a skin rash.  You have a fever.  You have redness around your IV site that gets worse. Get help right away if:  You have difficulty breathing.  You have chest pain.  You have blood in your urine or stool, or you vomit blood. Summary  After the procedure, it is common to have a sore throat or nausea. It is also common to feel tired.  Have a responsible adult stay with you for the first 24 hours after general anesthesia. It is important to have someone help care for you until you are awake and alert.  When you feel hungry, start by eating small amounts of foods that are soft and easy to digest (bland), such as toast. Gradually return to your regular diet.  Drink enough fluid to keep your urine pale yellow.  Return to your normal activities as told by your health care provider. Ask your health care provider what activities are safe for you. This information is not intended to replace advice given to you by your health care provider. Make sure you discuss any questions you have with your health care provider. Document Revised: 01/28/2017 Document Reviewed: 09/10/2016 Elsevier Patient Education  [place]. How to Use Chlorhexidine for Bathing Chlorhexidine gluconate (CHG) is a germ-killing (antiseptic) solution that is used to clean the skin. It can get rid of the bacteria that normally live on the skin and can keep them away for about 24 hours. To clean your skin with CHG, you may be given:  A CHG solution to use in the shower or as part of a sponge bath.  A prepackaged cloth that contains CHG. Cleaning your skin with CHG may help lower the risk for infection:  While you are staying in the intensive care unit of the hospital.  If you have a vascular access, such as a central line, to provide short-term or long-term  access to your veins.  If you have a catheter to drain urine from your bladder.  If you are on a ventilator. A ventilator is a machine that helps you breathe by moving air in and out of your lungs.  After surgery. What are the risks? Risks of using CHG include:  A skin reaction.  Hearing loss, if CHG gets in your ears.  Eye injury, if CHG gets in your eyes and is not rinsed out.  The CHG product catching fire. Make sure that you avoid smoking and flames after applying CHG to your skin. Do not use CHG:  If you have a chlorhexidine allergy or have previously reacted to chlorhexidine.  On babies younger than 51 months of age. How to use CHG solution  Use CHG only as told by your health care provider, and follow the instructions on the label.  Use the full amount of CHG as directed. Usually, this is one bottle. During a shower Follow these steps when using CHG solution during a shower (unless your health care provider gives you different instructions): 1. Start the shower. 2. Use your normal soap and shampoo to wash your face and hair. 3. Turn off the shower or move out of the shower stream. 4. Pour the CHG onto a clean washcloth. Do not use any type of brush or rough-edged sponge. 5. Starting at your neck, lather your body down to your toes. Make sure you follow these instructions: ? If you will be having surgery, pay special attention to the part of your body where  you will be having surgery. Scrub this area for at least 1 minute. ? Do not use CHG on your head or face. If the solution gets into your ears or eyes, rinse them well with water. ? Avoid your genital area. ? Avoid any areas of skin that have broken skin, cuts, or scrapes. ? Scrub your back and under your arms. Make sure to wash skin folds. 6. Let the lather sit on your skin for 1-2 minutes or as long as told by your health care provider. 7. Thoroughly rinse your entire body in the shower. Make sure that all body  creases and crevices are rinsed well. 8. Dry off with a clean towel. Do not put any substances on your body afterward--such as powder, lotion, or perfume--unless you are told to do so by your health care provider. Only use lotions that are recommended by the manufacturer. 9. Put on clean clothes or pajamas. 10. If it is the night before your surgery, sleep in clean sheets.  During a sponge bath Follow these steps when using CHG solution during a sponge bath (unless your health care provider gives you different instructions): 1. Use your normal soap and shampoo to wash your face and hair. 2. Pour the CHG onto a clean washcloth. 3. Starting at your neck, lather your body down to your toes. Make sure you follow these instructions: ? If you will be having surgery, pay special attention to the part of your body where you will be having surgery. Scrub this area for at least 1 minute. ? Do not use CHG on your head or face. If the solution gets into your ears or eyes, rinse them well with water. ? Avoid your genital area. ? Avoid any areas of skin that have broken skin, cuts, or scrapes. ? Scrub your back and under your arms. Make sure to wash skin folds. 4. Let the lather sit on your skin for 1-2 minutes or as long as told by your health care provider. 5. Using a different clean, wet washcloth, thoroughly rinse your entire body. Make sure that all body creases and crevices are rinsed well. 6. Dry off with a clean towel. Do not put any substances on your body afterward--such as powder, lotion, or perfume--unless you are told to do so by your health care provider. Only use lotions that are recommended by the manufacturer. 7. Put on clean clothes or pajamas. 8. If it is the night before your surgery, sleep in clean sheets. How to use CHG prepackaged cloths  Only use CHG cloths as told by your health care provider, and follow the instructions on the label.  Use the CHG cloth on clean, dry skin.  Do  not use the CHG cloth on your head or face unless your health care provider tells you to.  When washing with the CHG cloth: ? Avoid your genital area. ? Avoid any areas of skin that have broken skin, cuts, or scrapes. Before surgery Follow these steps when using a CHG cloth to clean before surgery (unless your health care provider gives you different instructions): 1. Using the CHG cloth, vigorously scrub the part of your body where you will be having surgery. Scrub using a back-and-forth motion for 3 minutes. The area on your body should be completely wet with CHG when you are done scrubbing. 2. Do not rinse. Discard the cloth and let the area air-dry. Do not put any substances on the area afterward, such as powder, lotion, or perfume. 3.  Put on clean clothes or pajamas. 4. If it is the night before your surgery, sleep in clean sheets.  For general bathing Follow these steps when using CHG cloths for general bathing (unless your health care provider gives you different instructions). 1. Use a separate CHG cloth for each area of your body. Make sure you wash between any folds of skin and between your fingers and toes. Wash your body in the following order, switching to a new cloth after each step: ? The front of your neck, shoulders, and chest. ? Both of your arms, under your arms, and your hands. ? Your stomach and groin area, avoiding the genitals. ? Your right leg and foot. ? Your left leg and foot. ? The back of your neck, your back, and your buttocks. 2. Do not rinse. Discard the cloth and let the area air-dry. Do not put any substances on your body afterward--such as powder, lotion, or perfume--unless you are told to do so by your health care provider. Only use lotions that are recommended by the manufacturer. 3. Put on clean clothes or pajamas. Contact a health care provider if:  Your skin gets irritated after scrubbing.  You have questions about using your solution or cloth. Get  help right away if:  Your eyes become very red or swollen.  Your eyes itch badly.  Your skin itches badly and is red or swollen.  Your hearing changes.  You have trouble seeing.  You have swelling or tingling in your mouth or throat.  You have trouble breathing.  You swallow any chlorhexidine. Summary  Chlorhexidine gluconate (CHG) is a germ-killing (antiseptic) solution that is used to clean the skin. Cleaning your skin with CHG may help to lower your risk for infection.  You may be given CHG to use for bathing. It may be in a bottle or in a prepackaged cloth to use on your skin. Carefully follow your health care provider's instructions and the instructions on the product label.  Do not use CHG if you have a chlorhexidine allergy.  Contact your health care provider if your skin gets irritated after scrubbing. This information is not intended to replace advice given to you by your health care provider. Make sure you discuss any questions you have with your health care provider. Document Revised: 04/13/2018 Document Reviewed: 12/23/2016 Elsevier Patient Education  [place].

## 2019-09-11 ENCOUNTER — Other Ambulatory Visit: Payer: Self-pay

## 2019-09-11 ENCOUNTER — Encounter (HOSPITAL_COMMUNITY)
Admission: RE | Admit: 2019-09-11 | Discharge: 2019-09-11 | Disposition: A | Discharge status: Home / Self Care | Payer: Medicare Other | Source: Ambulatory Visit | Attending: Urology | Admitting: Urology

## 2019-09-11 ENCOUNTER — Other Ambulatory Visit (HOSPITAL_COMMUNITY)
Admission: RE | Admit: 2019-09-11 | Discharge: 2019-09-11 | Disposition: A | Discharge status: Home / Self Care | Payer: Medicare Other | Source: Ambulatory Visit | Attending: Urology | Admitting: Urology

## 2019-09-11 ENCOUNTER — Encounter (HOSPITAL_COMMUNITY): Payer: Self-pay

## 2019-09-11 DIAGNOSIS — Z01812 Encounter for preprocedural laboratory examination: Secondary | ICD-10-CM | POA: Insufficient documentation

## 2019-09-11 DIAGNOSIS — Z20822 Contact with and (suspected) exposure to covid-19: Secondary | ICD-10-CM | POA: Insufficient documentation

## 2019-09-11 LAB — CBC WITH DIFFERENTIAL/PLATELET
Abs Immature Granulocytes: 0.21 10*3/uL — ABNORMAL HIGH (ref 0.00–0.07)
Basophils Absolute: 0 10*3/uL (ref 0.0–0.1)
Basophils Relative: 0 %
Eosinophils Absolute: 0.2 10*3/uL (ref 0.0–0.5)
Eosinophils Relative: 2 %
HCT: 29.4 % — ABNORMAL LOW (ref 36.0–46.0)
Hemoglobin: 9 g/dL — ABNORMAL LOW (ref 12.0–15.0)
Immature Granulocytes: 2 %
Lymphocytes Relative: 38 %
Lymphs Abs: 4.3 10*3/uL — ABNORMAL HIGH (ref 0.7–4.0)
MCH: 27.6 pg (ref 26.0–34.0)
MCHC: 30.6 g/dL (ref 30.0–36.0)
MCV: 90.2 fL (ref 80.0–100.0)
Monocytes Absolute: 0.7 10*3/uL (ref 0.1–1.0)
Monocytes Relative: 6 %
Neutro Abs: 5.9 10*3/uL (ref 1.7–7.7)
Neutrophils Relative %: 52 %
Platelets: 275 10*3/uL (ref 150–400)
RBC: 3.26 MIL/uL — ABNORMAL LOW (ref 3.87–5.11)
RDW: 19.3 % — ABNORMAL HIGH (ref 11.5–15.5)
WBC: 11.3 10*3/uL — ABNORMAL HIGH (ref 4.0–10.5)
nRBC: 0.9 % — ABNORMAL HIGH (ref 0.0–0.2)

## 2019-09-11 LAB — SARS CORONAVIRUS 2 (TAT 6-24 HRS): SARS Coronavirus 2: NEGATIVE

## 2019-09-12 ENCOUNTER — Inpatient Hospital Stay (HOSPITAL_COMMUNITY): Payer: Medicare Other | Attending: Hematology | Admitting: Hematology

## 2019-09-12 ENCOUNTER — Inpatient Hospital Stay (HOSPITAL_COMMUNITY): Payer: Medicare Other

## 2019-09-12 ENCOUNTER — Ambulatory Visit (HOSPITAL_COMMUNITY)
Admission: RE | Admit: 2019-09-12 | Discharge: 2019-09-12 | Disposition: A | Discharge status: Home / Self Care | Payer: Medicare Other | Source: Ambulatory Visit | Attending: Hematology | Admitting: Hematology

## 2019-09-12 VITALS — BP 142/64 | HR 75 | Temp 97.5°F | Resp 18 | Wt 162.5 lb

## 2019-09-12 DIAGNOSIS — D472 Monoclonal gammopathy: Secondary | ICD-10-CM

## 2019-09-12 DIAGNOSIS — D649 Anemia, unspecified: Secondary | ICD-10-CM | POA: Diagnosis not present

## 2019-09-12 DIAGNOSIS — E039 Hypothyroidism, unspecified: Secondary | ICD-10-CM | POA: Diagnosis not present

## 2019-09-12 DIAGNOSIS — E119 Type 2 diabetes mellitus without complications: Secondary | ICD-10-CM | POA: Insufficient documentation

## 2019-09-12 DIAGNOSIS — Z7984 Long term (current) use of oral hypoglycemic drugs: Secondary | ICD-10-CM | POA: Diagnosis not present

## 2019-09-12 DIAGNOSIS — E538 Deficiency of other specified B group vitamins: Secondary | ICD-10-CM | POA: Diagnosis not present

## 2019-09-12 LAB — LACTATE DEHYDROGENASE: LDH: 151 U/L (ref 98–192)

## 2019-09-12 NOTE — Patient Instructions (Signed)
[place] at [facility] Discharge Instructions  You were seen today by Dr. Delton Coombes. He went over your recent results. You will have blood drawn for further analysis. You will be scheduled for a scan of your bones and a bone marrow biopsy. Dr. Delton Coombes will see you back in 2 weeks for follow up.   Thank you for choosing [place] at [facility] to provide your oncology and hematology care.  To afford each patient quality time with our provider, please arrive at least 15 minutes before your scheduled appointment time.   If you have a lab appointment with the [place] please come in thru the Main Entrance and check in at the main information desk  You need to re-schedule your appointment should you arrive 10 or more minutes late.  We strive to give you quality time with our providers, and arriving late affects you and other patients whose appointments are after yours.  Also, if you no show three or more times for appointments you may be dismissed from the clinic at the providers discretion.     Again, thank you for choosing [facility].  Our hope is that these requests will decrease the amount of time that you wait before being seen by our physicians.       _____________________________________________________________  Should you have questions after your visit to [facility], please contact our office at [phone] between the hours of 8:00 a.m. and 4:30 p.m.  Voicemails left after 4:00 p.m. will not be returned until the following business day.  For prescription refill requests, have your pharmacy contact our office and allow 72 hours.    Cancer Center Support Programs:   > Cancer Support Group  2nd Tuesday of the month 1pm-2pm, Journey Room

## 2019-09-12 NOTE — Progress Notes (Signed)
Kim Mills, [place] [address]   CLINIC:  Medical Oncology/Hematology  PCP:  Alycia Rossetti, MD [address] [place] [address]  [phone]  REASON FOR VISIT:  Follow-up for normocytic anemia  PRIOR THERAPY: None  CURRENT THERAPY: Iron tablets BID  INTERVAL HISTORY:  Kim Mills, a 79 y.o. female, returns for routine follow-up for her normocytic anemia. Kim Mills was last seen on 09/04/2019.  Today Kim Mills is accompanied by her daughter. Kim Mills reports feeling somewhat better after receiving her blood transfusion on 7/29. Kim Mills is still taking multiple naps throughout the day.  Kim Mills is scheduled for a cystoscopy with bilateral pyelogram and ureteral stent placement and ureteral biopsy on 8/5 with Dr. [name].   REVIEW OF SYSTEMS:  Review of Systems  Constitutional: Positive for appetite change (mildly decreased) and fatigue (moderate).  Respiratory: Positive for cough.   Gastrointestinal: Positive for constipation.  Neurological: Positive for headaches and numbness (feet).  All other systems reviewed and are negative.   PAST MEDICAL/SURGICAL HISTORY:  Past Medical History:  Diagnosis Date  . Arthritis   . Cataract   . Chest pain    Associated with weakness and fatigue  . Diabetes mellitus    A1c of 7.4 in 08/2010  . GERD (gastroesophageal reflux disease)   . Glaucoma   . Hyperlipidemia    Lipid profile in 08/2010:190, 121, 48, 118; normal CBC and CMet  . Hypertension    Lipid profile in 08/2010:190, 121, 48, 118; normal CBC and CMet  . Hypothyroidism   . Hypothyroidism   . Obesity    Past Surgical History:  Procedure Laterality Date  . CHOLECYSTECTOMY    . COLONOSCOPY N/A 04/01/2014   RMR: Melanosis coli. colonic polyps removed as described above.   Marland Kitchen LEFT HEART CATH AND CORONARY ANGIOGRAPHY N/A 10/07/2017   Procedure: LEFT HEART CATH AND CORONARY ANGIOGRAPHY;  Surgeon: Troy Sine, MD;  Location: [facility];  Service: Cardiovascular;  Laterality: N/A;  . THYROIDECTOMY, PARTIAL      SOCIAL HISTORY:  Social History   Socioeconomic History  . Marital status: Widowed    Spouse name: Not on file  . Number of children: 7  . Years of education: Not on file  . Highest education level: Not on file  Occupational History    Employer: RETIRED  Tobacco Use  . Smoking status: Passive Smoke Exposure - Never Smoker  . Smokeless tobacco: Never Used  Vaping Use  . Vaping Use: Never used  Substance and Sexual Activity  . Alcohol use: Never  . Drug use: Never  . Sexual activity: Not Currently  Other Topics Concern  . Not on file  Social History Narrative  . Not on file   Social Determinants of Health   Financial Resource Strain:   . Difficulty of Paying Living Expenses:   Food Insecurity:   . Worried About Charity fundraiser in the Last Year:   . Arboriculturist in the Last Year:   Transportation Needs:   . Film/video editor (Medical):   Marland Kitchen Lack of Transportation (Non-Medical):   Physical Activity:   . Days of Exercise per Week:   . Minutes of Exercise per Session:   Stress:   . Feeling of Stress :   Social Connections:   . Frequency of Communication with Friends and Family:   . Frequency of Social Gatherings with Friends and Family:   .  Attends Religious Services:   . Active Member of Clubs or Organizations:   . Attends Archivist Meetings:   Marland Kitchen Marital Status:   Intimate Partner Violence:   . Fear of Current or Ex-Partner:   . Emotionally Abused:   Marland Kitchen Physically Abused:   . Sexually Abused:     FAMILY HISTORY:  Family History  Problem Relation Age of Onset  . Anemia Father   . Arthritis Mother   . Stroke Brother   . Cancer Sister        unknown kind  . Diabetes Sister   . Dementia Sister   . Cancer Niece        Breast  . Cancer Brother   . Healthy Son   . Healthy Son   . Healthy Son   . Healthy Daughter   . Healthy Daughter   .  Healthy Daughter   . Sarcoidosis Daughter     CURRENT MEDICATIONS:  Current Outpatient Medications  Medication Sig Dispense Refill  . amLODipine (NORVASC) 10 MG tablet Take 1 tablet (10 mg total) by mouth daily. 90 tablet 3  . Blood Glucose Monitoring Suppl (BLOOD GLUCOSE SYSTEM PAK) KIT Please dispense based on patient and insurance preference. Use as directed to monitor FSBS 2x daily. Dx: E11.9. 1 each 1  . cyanocobalamin (,VITAMIN B-12,) 1000 MCG/ML injection Inject 1 mL (1,000 mcg total) into the muscle every 30 (thirty) days. 1 mL 0  . ferrous sulfate (SM IRON SLOW RELEASE) 160 (50 Fe) MG TBCR SR tablet Take 1 tablet (160 mg total) by mouth 2 (two) times daily with a meal. 60 tablet 11  . gabapentin (NEURONTIN) 300 MG capsule Take 1 capsule (300 mg total) by mouth at bedtime. 90 capsule 3  . Garlic (GARLIQUE) 786 MG TBEC Take 400 mg by mouth every evening.     Marland Kitchen glipiZIDE (GLUCOTROL) 5 MG tablet Take 1 tablet (5 mg total) by mouth 2 (two) times daily. 180 tablet 3  . Glucose Blood (BLOOD GLUCOSE TEST STRIPS) STRP Please dispense based on patient and insurance preference. Use as directed to monitor FSBS 2x daily. Dx: E11.9. 100 each 11  . hydrochlorothiazide (MICROZIDE) 12.5 MG capsule TAKE ONE CAPSULE BY MOUTH DAILY (Patient taking differently: Take 12.5 mg by mouth daily. ) 90 capsule 3  . Lancets MISC Please dispense based on patient and insurance preference. Use as directed to monitor FSBS 2x daily. Dx: E11.9. 100 each 11  . loratadine (CLARITIN) 10 MG tablet Take 1 tablet (10 mg total) by mouth daily. 30 tablet 0  . meclizine (ANTIVERT) 25 MG tablet Take 25 mg by mouth 3 (three) times daily as needed for dizziness.     . metFORMIN (GLUCOPHAGE) 1000 MG tablet TAKE ONE TABLET BY MOUTH TWICE A DAY (Patient taking differently: Take 1,000 mg by mouth 2 (two) times daily with a meal. ) 60 tablet 3  . metoprolol tartrate (LOPRESSOR) 25 MG tablet Take 0.5 tablets (12.5 mg total) by mouth 2 (two)  times daily. 90 tablet 3  . Omega-3 Fatty Acids (OMEGA-3 FISH OIL PO) Take 1 capsule by mouth every evening.     Marland Kitchen omeprazole (PRILOSEC) 40 MG capsule TAKE ONE CAPSULE BY MOUTH DAILY (Patient taking differently: Take 40 mg by mouth daily. ) 90 capsule 3  . simvastatin (ZOCOR) 40 MG tablet TAKE ONE TABLET (40MG TOTAL) BY MOUTH BEDTIME (Patient taking differently: Take 40 mg by mouth at bedtime. ) 90 tablet 3  . SYNTHROID 137 MCG  tablet Take 1 tablet (137 mcg total) by mouth daily before breakfast. 30 tablet 3  . tolterodine (DETROL LA) 4 MG 24 hr capsule TAKE ONE CAPSULE BY MOUTH DAILY (Patient taking differently: Take 4 mg by mouth daily. ) 90 capsule 3  . traMADol (ULTRAM) 50 MG tablet Take 1 tablet (50 mg total) by mouth 2 (two) times daily as needed. 45 tablet 1   Current Facility-Administered Medications  Medication Dose Route Frequency Provider Last Rate Last Admin  . cyanocobalamin ((VITAMIN B-12)) injection 1,000 mcg  1,000 mcg Intramuscular Q30 days Vic Blackbird F, MD   1,000 mcg at 08/17/19 1635    ALLERGIES:  No Known Allergies  PHYSICAL EXAM:  Performance status (ECOG): 2 - Symptomatic, <50% confined to bed  Vitals:   09/12/19 1216  BP: (!) 142/64  Pulse: 75  Resp: 18  Temp: (!) 97.5 F (36.4 C)  SpO2: 95%   Wt Readings from Last 3 Encounters:  09/12/19 162 lb 8 oz (73.7 kg)  09/11/19 178 lb (80.7 kg)  09/04/19 178 lb 1.6 oz (80.8 kg)   Physical Exam Vitals reviewed.  Constitutional:      Appearance: Normal appearance. Kim Mills is obese.  Cardiovascular:     Rate and Rhythm: Normal rate and regular rhythm.     Pulses: Normal pulses.     Heart sounds: Normal heart sounds.  Pulmonary:     Effort: Pulmonary effort is normal.     Breath sounds: Normal breath sounds.  Musculoskeletal:     Right lower leg: No edema.     Left lower leg: No edema.  Neurological:     General: No focal deficit present.     Mental Status: Kim Mills is alert and oriented to person, place, and  time. Mental status is at baseline.  Psychiatric:        Mood and Affect: Mood normal.        Behavior: Behavior normal.     LABORATORY DATA:  I have reviewed the labs as listed.  CBC Latest Ref Rng & Units 09/11/2019 09/04/2019 08/21/2019  WBC 4.0 - 10.5 K/uL 11.3(H) 11.4(H) 8.8  Hemoglobin 12.0 - 15.0 g/dL 9.0(L) 6.9(LL) 6.9(L)  Hematocrit 36 - 46 % 29.4(L) 23.9(L) 23.4(L)  Platelets 150 - 400 K/uL 275 294 267   CMP Latest Ref Rng & Units 09/04/2019 08/17/2019 08/09/2019  Glucose 70 - 99 mg/dL 219(H) 212(H) -  BUN 8 - 23 mg/dL 19 15 -  Creatinine 0.44 - 1.00 mg/dL 0.84 0.88 1.00  Sodium 135 - 145 mmol/L 139 141 -  Potassium 3.5 - 5.1 mmol/L 3.3(L) 3.5 -  Chloride 98 - 111 mmol/L 107 106 -  CO2 22 - 32 mmol/L 24 26 -  Calcium 8.9 - 10.3 mg/dL 9.2 9.1 -  Total Protein 6.5 - 8.1 g/dL 8.1 7.5 -  Total Bilirubin 0.3 - 1.2 mg/dL 0.5 0.5 -  Alkaline Phos 38 - 126 U/L 59 - -  AST 15 - 41 U/L 15 15 -  ALT 0 - 44 U/L 11 12 -      Component Value Date/Time   RBC 3.26 (L) 09/11/2019 1451   MCV 90.2 09/11/2019 1451   MCH 27.6 09/11/2019 1451   MCHC 30.6 09/11/2019 1451   RDW 19.3 (H) 09/11/2019 1451   LYMPHSABS 4.3 (H) 09/11/2019 1451   MONOABS 0.7 09/11/2019 1451   EOSABS 0.2 09/11/2019 1451   BASOSABS 0.0 09/11/2019 1451   Lab Results  Component Value Date   LDH 135  09/04/2019    DIAGNOSTIC IMAGING:  I have independently reviewed the scans and discussed with the patient. No results found.   ASSESSMENT:  1.  Normocytic anemia: -Patient seen at the request of Dr. Buelah Manis for further work-up and management of normocytic anemia. -Recent CBC on 08/21/2019 shows hemoglobin 6.9 with MCV of 88.3.  White count and platelets are normal.  No history of CKD.  Denies any bleeding per rectum or melena. Last colonoscopy on 04/01/2014 shows diffusely pigmented rectal mucosa consistent with melanosis [name]  Diffusely pigmented colonic mucosa, 2 diminutive polyps in the mid  ascending segment, otherwise the remainder of the colon mucosa was normal.  Biopsy consistent with tubular adenoma. -CT renal study on 05/11/2019 shows normal-sized spleen with normal liver.  No other abnormal adenopathy. -Denies any prior history of blood transfusion.  Kim Mills is currently taking iron tablet twice daily.   PLAN:  1.  Normocytic anemia: -We reviewed labs from 09/04/2019.  Hemoglobin was 6.9 with MCV of 90.5. -Kim Mills received 1 unit PRBC on 09/05/2019.  J17, folic acid, copper levels were normal.  Ferritin was 128 and percent saturation was 24.  LDH was 135.  Stool for occult blood was negative.  Creatinine was 0.84. -Normocytic anemia likely from monoclonal gammopathy which was incidentally found.  Further work-up is needed.  2.  Diabetes: -Continue glipizide and Metformin.  3.  Hypertension: -Continue amlodipine and HCTZ.  4.  Hypothyroidism: -Continue Synthroid daily.  5.  B12 deficiency: -Continue B12 monthly injection.  6.  Monoclonal gammopathy: -SPEP on 09/04/2019 shows M spike of 2.1 g. -We will check serum immunofixation and free light chains.  Check LDH and beta-2 microglobulin. -Check skeletal survey.  I have also recommended bone marrow biopsy. -RTC 2 weeks to discuss results.  We will also consider PET scan based on the findings.  Orders placed this encounter:  No orders of the defined types were placed in this encounter.  Total time spent is 30 minutes with more than 50% of the time spent face-to-face discussing lab results, further work-up, counseling and coordination of care.  Derek Jack, MD [place] [phone]   I, Milinda Antis, am acting as a scribe for Dr. Sanda Linger.  I, Derek Jack MD, have reviewed the above documentation for accuracy and completeness, and I agree with the above.

## 2019-09-13 ENCOUNTER — Ambulatory Visit (HOSPITAL_COMMUNITY)
Admission: RE | Admit: 2019-09-13 | Discharge: 2019-09-13 | Disposition: A | Discharge status: Home / Self Care | Payer: Medicare Other | Attending: Urology | Admitting: Urology

## 2019-09-13 ENCOUNTER — Other Ambulatory Visit: Payer: Self-pay

## 2019-09-13 ENCOUNTER — Ambulatory Visit (HOSPITAL_COMMUNITY): Payer: Medicare Other

## 2019-09-13 ENCOUNTER — Ambulatory Visit (HOSPITAL_COMMUNITY): Payer: Medicare Other | Admitting: Anesthesiology

## 2019-09-13 ENCOUNTER — Encounter (HOSPITAL_COMMUNITY)
Admission: RE | Disposition: A | Discharge status: Home / Self Care | Payer: Self-pay | Source: Home / Self Care | Attending: Urology

## 2019-09-13 DIAGNOSIS — Z6831 Body mass index (BMI) 31.0-31.9, adult: Secondary | ICD-10-CM | POA: Diagnosis not present

## 2019-09-13 DIAGNOSIS — E785 Hyperlipidemia, unspecified: Secondary | ICD-10-CM | POA: Insufficient documentation

## 2019-09-13 DIAGNOSIS — N3289 Other specified disorders of bladder: Secondary | ICD-10-CM | POA: Diagnosis not present

## 2019-09-13 DIAGNOSIS — E669 Obesity, unspecified: Secondary | ICD-10-CM | POA: Diagnosis not present

## 2019-09-13 DIAGNOSIS — N3031 Trigonitis with hematuria: Secondary | ICD-10-CM | POA: Diagnosis not present

## 2019-09-13 DIAGNOSIS — I2510 Atherosclerotic heart disease of native coronary artery without angina pectoris: Secondary | ICD-10-CM | POA: Diagnosis not present

## 2019-09-13 DIAGNOSIS — Z79899 Other long term (current) drug therapy: Secondary | ICD-10-CM | POA: Diagnosis not present

## 2019-09-13 DIAGNOSIS — N289 Disorder of kidney and ureter, unspecified: Secondary | ICD-10-CM

## 2019-09-13 DIAGNOSIS — K219 Gastro-esophageal reflux disease without esophagitis: Secondary | ICD-10-CM | POA: Diagnosis not present

## 2019-09-13 DIAGNOSIS — Z7984 Long term (current) use of oral hypoglycemic drugs: Secondary | ICD-10-CM | POA: Insufficient documentation

## 2019-09-13 DIAGNOSIS — D649 Anemia, unspecified: Secondary | ICD-10-CM | POA: Insufficient documentation

## 2019-09-13 DIAGNOSIS — E890 Postprocedural hypothyroidism: Secondary | ICD-10-CM | POA: Diagnosis not present

## 2019-09-13 DIAGNOSIS — N3020 Other chronic cystitis without hematuria: Secondary | ICD-10-CM | POA: Diagnosis not present

## 2019-09-13 DIAGNOSIS — Z79890 Hormone replacement therapy (postmenopausal): Secondary | ICD-10-CM | POA: Diagnosis not present

## 2019-09-13 DIAGNOSIS — E119 Type 2 diabetes mellitus without complications: Secondary | ICD-10-CM | POA: Insufficient documentation

## 2019-09-13 DIAGNOSIS — I10 Essential (primary) hypertension: Secondary | ICD-10-CM | POA: Diagnosis not present

## 2019-09-13 DIAGNOSIS — N3030 Trigonitis without hematuria: Secondary | ICD-10-CM | POA: Diagnosis not present

## 2019-09-13 DIAGNOSIS — N329 Bladder disorder, unspecified: Secondary | ICD-10-CM | POA: Diagnosis present

## 2019-09-13 HISTORY — PX: CYSTOSCOPY W/ URETERAL STENT PLACEMENT: SHX1429

## 2019-09-13 LAB — KAPPA/LAMBDA LIGHT CHAINS
Kappa free light chain: 19.7 mg/L — ABNORMAL HIGH (ref 3.3–19.4)
Kappa, lambda light chain ratio: 1.38 (ref 0.26–1.65)
Lambda free light chains: 14.3 mg/L (ref 5.7–26.3)

## 2019-09-13 LAB — GLUCOSE, CAPILLARY
Glucose-Capillary: 127 mg/dL — ABNORMAL HIGH (ref 70–99)
Glucose-Capillary: 81 mg/dL (ref 70–99)
Glucose-Capillary: 91 mg/dL (ref 70–99)

## 2019-09-13 SURGERY — CYSTOSCOPY, WITH RETROGRADE PYELOGRAM AND URETERAL STENT INSERTION
Anesthesia: General | Site: Ureter | Laterality: Right

## 2019-09-13 MED ORDER — FENTANYL CITRATE (PF) 100 MCG/2ML IJ SOLN
INTRAMUSCULAR | Status: AC
  Filled 2019-09-13: qty 2

## 2019-09-13 MED ORDER — SODIUM CHLORIDE 0.9 % IR SOLN
Status: DC | PRN
  Administered 2019-09-13 (×2): 3000 mL

## 2019-09-13 MED ORDER — CEFAZOLIN SODIUM-DEXTROSE 2-4 GM/100ML-% IV SOLN
2.0000 g | INTRAVENOUS | Status: AC
  Administered 2019-09-13: 2 g via INTRAVENOUS

## 2019-09-13 MED ORDER — CEFAZOLIN SODIUM-DEXTROSE 2-4 GM/100ML-% IV SOLN
INTRAVENOUS | Status: AC
  Filled 2019-09-13: qty 100

## 2019-09-13 MED ORDER — LACTATED RINGERS IV SOLN
INTRAVENOUS | Status: DC | PRN
Start: 2019-09-13 — End: 2019-09-13

## 2019-09-13 MED ORDER — DIATRIZOATE MEGLUMINE 30 % UR SOLN
URETHRAL | Status: DC | PRN
  Administered 2019-09-13: 15 mL via URETHRAL

## 2019-09-13 MED ORDER — LACTATED RINGERS IV SOLN: Freq: Once | INTRAVENOUS | Status: AC

## 2019-09-13 MED ORDER — ONDANSETRON HCL 4 MG/2ML IJ SOLN
INTRAMUSCULAR | Status: DC | PRN
  Administered 2019-09-13: 4 mg via INTRAVENOUS

## 2019-09-13 MED ORDER — EPHEDRINE 5 MG/ML INJ
INTRAVENOUS | Status: AC
  Filled 2019-09-13: qty 10

## 2019-09-13 MED ORDER — FENTANYL CITRATE (PF) 100 MCG/2ML IJ SOLN
INTRAMUSCULAR | Status: DC | PRN
  Administered 2019-09-13 (×2): 50 ug via INTRAVENOUS

## 2019-09-13 MED ORDER — HYDROMORPHONE HCL 1 MG/ML IJ SOLN: 0.2500 mg | INTRAMUSCULAR | Status: DC | PRN

## 2019-09-13 MED ORDER — STERILE WATER FOR IRRIGATION IR SOLN
Status: DC | PRN
  Administered 2019-09-13: 1000 mL
  Administered 2019-09-13: 3000 mL

## 2019-09-13 MED ORDER — ORAL CARE MOUTH RINSE: 15.0000 mL | Freq: Once | OROMUCOSAL | Status: AC

## 2019-09-13 MED ORDER — PROPOFOL 10 MG/ML IV BOLUS
INTRAVENOUS | Status: DC | PRN
  Administered 2019-09-13: 150 mg via INTRAVENOUS

## 2019-09-13 MED ORDER — TRAMADOL HCL 50 MG PO TABS: 50.0000 mg | ORAL_TABLET | Freq: Four times a day (QID) | ORAL | 0 refills | Status: DC | PRN

## 2019-09-13 MED ORDER — CHLORHEXIDINE GLUCONATE 0.12 % MT SOLN
OROMUCOSAL | Status: AC
  Filled 2019-09-13: qty 15

## 2019-09-13 MED ORDER — DIATRIZOATE MEGLUMINE 30 % UR SOLN
URETHRAL | Status: AC
  Filled 2019-09-13: qty 100

## 2019-09-13 MED ORDER — ONDANSETRON HCL 4 MG/2ML IJ SOLN: 4.0000 mg | Freq: Once | INTRAMUSCULAR | Status: DC | PRN

## 2019-09-13 MED ORDER — EPHEDRINE SULFATE 50 MG/ML IJ SOLN
INTRAMUSCULAR | Status: DC | PRN
Start: 2019-09-13 — End: 2019-09-13
  Administered 2019-09-13 (×2): 10 mg via INTRAVENOUS

## 2019-09-13 MED ORDER — PROPOFOL 10 MG/ML IV BOLUS
INTRAVENOUS | Status: AC
  Filled 2019-09-13: qty 20

## 2019-09-13 MED ORDER — CHLORHEXIDINE GLUCONATE 0.12 % MT SOLN
15.0000 mL | Freq: Once | OROMUCOSAL | Status: AC
  Administered 2019-09-13: 15 mL via OROMUCOSAL

## 2019-09-13 SURGICAL SUPPLY — 27 items
BAG DRAIN URO TABLE W/ADPT NS (BAG) ×3 IMPLANT
BAG DRN 8 ADPR NS SKTRN CSTL (BAG) ×2
BAG DRN RND TRDRP ANRFLXCHMBR (UROLOGICAL SUPPLIES) ×2
BAG HAMPER (MISCELLANEOUS) ×3 IMPLANT
BAG URINE DRAIN 2000ML AR STRL (UROLOGICAL SUPPLIES) ×3 IMPLANT
CATH FOLEY 2WAY SLVR  5CC 18FR (CATHETERS) ×3
CATH FOLEY 2WAY SLVR 5CC 18FR (CATHETERS) ×2 IMPLANT
CATH INTERMIT  6FR 70CM (CATHETERS) ×3 IMPLANT
CLOTH BEACON ORANGE TIMEOUT ST (SAFETY) ×3 IMPLANT
DECANTER SPIKE VIAL GLASS SM (MISCELLANEOUS) ×3 IMPLANT
GLOVE BIO SURGEON STRL SZ8 (GLOVE) ×3 IMPLANT
GLOVE BIOGEL PI IND STRL 7.0 (GLOVE) ×4 IMPLANT
GLOVE BIOGEL PI INDICATOR 7.0 (GLOVE) ×2
GOWN STRL REUS W/TWL LRG LVL3 (GOWN DISPOSABLE) ×3 IMPLANT
GOWN STRL REUS W/TWL XL LVL3 (GOWN DISPOSABLE) ×3 IMPLANT
GUIDEWIRE STR DUAL SENSOR (WIRE) ×3 IMPLANT
GUIDEWIRE STR ZIPWIRE 035X150 (MISCELLANEOUS) ×3 IMPLANT
IV NS IRRIG 3000ML ARTHROMATIC (IV SOLUTION) ×3 IMPLANT
KIT TURNOVER CYSTO (KITS) ×3 IMPLANT
MANIFOLD NEPTUNE II (INSTRUMENTS) ×3 IMPLANT
PACK CYSTO (CUSTOM PROCEDURE TRAY) ×3 IMPLANT
PAD ARMBOARD 7.5X6 YLW CONV (MISCELLANEOUS) ×3 IMPLANT
SHEATH URETERAL 12FRX35CM (MISCELLANEOUS) ×3 IMPLANT
STENT URET 6FRX26 CONTOUR (STENTS) IMPLANT
SYR 10ML LL (SYRINGE) ×3 IMPLANT
TOWEL OR 17X26 4PK STRL BLUE (TOWEL DISPOSABLE) ×3 IMPLANT
WATER STERILE IRR 500ML POUR (IV SOLUTION) ×3 IMPLANT

## 2019-09-13 NOTE — Discharge Instructions (Signed)
Indwelling Urinary Catheter Care, Adult °An indwelling urinary catheter is a thin tube that is put into your bladder. The tube helps to drain pee (urine) out of your body. The tube goes in through your urethra. Your urethra is where pee comes out of your body. Your pee will come out through the catheter, then it will go into a bag (drainage bag). °Take good care of your catheter so it will work well. °How to wear your catheter and bag °Supplies needed °· Sticky tape (adhesive tape) or a leg strap. °· Alcohol wipe or soap and water (if you use tape). °· A clean towel (if you use tape). °· Large overnight bag. °· Smaller bag (leg bag). °Wearing your catheter °Attach your catheter to your leg with tape or a leg strap. °· Make sure the catheter is not pulled tight. °· If a leg strap gets wet, take it off and put on a dry strap. °· If you use tape to hold the bag on your leg: °1. Use an alcohol wipe or soap and water to wash your skin where the tape made it sticky before. °2. Use a clean towel to pat-dry that skin. °3. Use new tape to make the bag stay on your leg. °Wearing your bags °You should have been given a large overnight bag. °· You may wear the overnight bag in the day or night. °· Always have the overnight bag lower than your bladder.  Do not let the bag touch the floor. °· Before you go to sleep, put a clean plastic bag in a wastebasket. Then hang the overnight bag inside the wastebasket. °You should also have a smaller leg bag that fits under your clothes. °· Always wear the leg bag below your knee. °· Do not wear your leg bag at night. °How to care for your skin and catheter °Supplies needed °· A clean washcloth. °· Water and mild soap. °· A clean towel. °Caring for your skin and catheter ° °  ° °· Clean the skin around your catheter every day: °1. Wash your hands with soap and water. °2. Wet a clean washcloth in warm water and mild soap. °3. Clean the skin around your urethra. °§ If you are  female: °§ Gently spread the folds of skin around your vagina (labia). °§ With the washcloth in your other hand, wipe the inner side of your labia on each side. Wipe from front to back. °§ If you are female: °§ Pull back any skin that covers the end of your penis (foreskin). °§ With the washcloth in your other hand, wipe your penis in small circles. Start wiping at the tip of your penis, then move away from the catheter. °§ Move the foreskin back in place, if needed. °4. With your free hand, hold the catheter close to where it goes into your body. °§ Keep holding the catheter during cleaning so it does not get pulled out. °5. With the washcloth in your other hand, clean the catheter. °§ Only wipe downward on the catheter. °§ Do not wipe upward toward your body. Doing this may push germs into your urethra and cause infection. °6. Use a clean towel to pat-dry the catheter and the skin around it. Make sure to wipe off all soap. °7. Wash your hands with soap and water. °· Shower every day. Do not take baths. °· Do not use cream, ointment, or lotion on the area where the catheter goes into your body, unless your doctor tells you   to. °· Do not use powders, sprays, or lotions on your genital area. °· Check your skin around the catheter every day for signs of infection. Check for: °? Redness, swelling, or pain. °? Fluid or blood. °? Warmth. °? Pus or a bad smell. °How to empty the bag °Supplies needed °· Rubbing alcohol. °· Gauze pad or cotton ball. °· Tape or a leg strap. °Emptying the bag °Pour the pee out of your bag when it is ?-½ full, or at least 2-3 times a day. Do this for your overnight bag and your leg bag. °1. Wash your hands with soap and water. °2. Separate (detach) the bag from your leg. °3. Hold the bag over the toilet or a clean pail. Keep the bag lower than your hips and bladder. This is so the pee (urine) does not go back into the tube. °4. Open the pour spout. It is at the bottom of the bag. °5. Empty the  pee into the toilet or pail. Do not let the pour spout touch any surface. °6. Put rubbing alcohol on a gauze pad or cotton ball. °7. Use the gauze pad or cotton ball to clean the pour spout. °8. Close the pour spout. °9. Attach the bag to your leg with tape or a leg strap. °10. Wash your hands with soap and water. °Follow instructions for cleaning the drainage bag: °· From the product maker. °· As told by your doctor. °How to change the bag °Supplies needed °· Alcohol wipes. °· A clean bag. °· Tape or a leg strap. °Changing the bag °Replace your bag when it starts to leak, smell bad, or look dirty. °1. Wash your hands with soap and water. °2. Separate the dirty bag from your leg. °3. Pinch the catheter with your fingers so that pee does not spill out. °4. Separate the catheter tube from the bag tube where these tubes connect (at the connection valve). Do not let the tubes touch any surface. °5. Clean the end of the catheter tube with an alcohol wipe. Use a different alcohol wipe to clean the end of the bag tube. °6. Connect the catheter tube to the tube of the clean bag. °7. Attach the clean bag to your leg with tape or a leg strap. Do not make the bag tight on your leg. °8. Wash your hands with soap and water. °General rules ° °· Never pull on your catheter. Never try to take it out. Doing that can hurt you. °· Always wash your hands before and after you touch your catheter or bag. Use a mild, fragrance-free soap. If you do not have soap and water, use hand sanitizer. °· Always make sure there are no twists or bends (kinks) in the catheter tube. °· Always make sure there are no leaks in the catheter or bag. °· Drink enough fluid to keep your pee pale yellow. °· Do not take baths, swim, or use a hot tub. °· If you are female, wipe from front to back after you poop (have a bowel movement). °Contact a doctor if: °· Your pee is cloudy. °· Your pee smells worse than usual. °· Your catheter gets clogged. °· Your catheter  leaks. °· Your bladder feels full. °Get help right away if: °· You have redness, swelling, or pain where the catheter goes into your body. °· You have fluid, blood, pus, or a bad smell coming from the area where the catheter goes into your body. °· Your skin feels warm where   the catheter goes into your body. °· You have a fever. °· You have pain in your: °? Belly (abdomen). °? Legs. °? Lower back. °? Bladder. °· You see blood in the catheter. °· Your pee is pink or red. °· You feel sick to your stomach (nauseous). °· You throw up (vomit). °· You have chills. °· Your pee is not draining into the bag. °· Your catheter gets pulled out. °Summary °· An indwelling urinary catheter is a thin tube that is placed into the bladder to help drain pee (urine) out of the body. °· The catheter is placed into the part of the body that drains pee from the bladder (urethra). °· Taking good care of your catheter will keep it working properly and help prevent problems. °· Always wash your hands before and after touching your catheter or bag. °· Never pull on your catheter or try to take it out. °This information is not intended to replace advice given to you by your health care provider. Make sure you discuss any questions you have with your health care provider. °Document Revised: 05/19/2018 Document Reviewed: 09/10/2016 °Elsevier Patient Education © 2020 [employer] ° ° ° ° °General Anesthesia, Adult, Care After °This sheet gives you information about how to care for yourself after your procedure. Your health care provider may also give you more specific instructions. If you have problems or questions, contact your health care provider. °What can I expect after the procedure? °After the procedure, the following side effects are common: °· Pain or discomfort at the IV site. °· Nausea. °· Vomiting. °· Sore throat. °· Trouble concentrating. °· Feeling cold or chills. °· Weak or tired. °· Sleepiness and fatigue. °· Soreness and body  aches. These side effects can affect parts of the body that were not involved in surgery. °Follow these instructions at home: ° °For at least 24 hours after the procedure: °· Have a responsible adult stay with you. It is important to have someone help care for you until you are awake and alert. °· Rest as needed. °· Do not: °? Participate in activities in which you could fall or become injured. °? Drive. °? Use heavy machinery. °? Drink alcohol. °? Take sleeping pills or medicines that cause drowsiness. °? Make important decisions or sign legal documents. °? Take care of children on your own. °Eating and drinking °· Follow any instructions from your health care provider about eating or drinking restrictions. °· When you feel hungry, start by eating small amounts of foods that are soft and easy to digest (bland), such as toast. Gradually return to your regular diet. °· Drink enough fluid to keep your urine pale yellow. °· If you vomit, rehydrate by drinking water, juice, or clear broth. °General instructions °· If you have sleep apnea, surgery and certain medicines can increase your risk for breathing problems. Follow instructions from your health care provider about wearing your sleep device: °? Anytime you are sleeping, including during daytime naps. °? While taking prescription pain medicines, sleeping medicines, or medicines that make you drowsy. °· Return to your normal activities as told by your health care provider. Ask your health care provider what activities are safe for you. °· Take over-the-counter and prescription medicines only as told by your health care provider. °· If you smoke, do not smoke without supervision. °· Keep all follow-up visits as told by your health care provider. This is important. °Contact a health care provider if: °· You have nausea or vomiting that   does not get better with medicine. °· You cannot eat or drink without vomiting. °· You have pain that does not get better with  medicine. °· You are unable to pass urine. °· You develop a skin rash. °· You have a fever. °· You have redness around your IV site that gets worse. °Get help right away if: °· You have difficulty breathing. °· You have chest pain. °· You have blood in your urine or stool, or you vomit blood. °Summary °· After the procedure, it is common to have a sore throat or nausea. It is also common to feel tired. °· Have a responsible adult stay with you for the first 24 hours after general anesthesia. It is important to have someone help care for you until you are awake and alert. °· When you feel hungry, start by eating small amounts of foods that are soft and easy to digest (bland), such as toast. Gradually return to your regular diet. °· Drink enough fluid to keep your urine pale yellow. °· Return to your normal activities as told by your health care provider. Ask your health care provider what activities are safe for you. °This information is not intended to replace advice given to you by your health care provider. Make sure you discuss any questions you have with your health care provider. °Document Revised: 01/28/2017 Document Reviewed: 09/10/2016 °Elsevier Patient Education © 2020 [employer] ° °

## 2019-09-13 NOTE — Op Note (Signed)
Preoperative diagnosis: Right ureteral mass  Postoperative diagnosis: posterior wall and right lateral wall bladder lesion  Procedure: 1 cystoscopy 2.  Right retrograde pyelography 3.  Intraoperative fluoroscopy, under one hour, with interpretation 4.  Right diagnostic ureteroscopy 5.  Bladder biopsy and fulgeration  Attending: Rosie Fate  Anesthesia: General  Estimated blood loss: minimal  Drains: 18 french foley  Specimens: posterior wall and right lateral wall tumor   Antibiotics: ancef  Findings: No hydronephrosis in the right collecting system. No masses/lesions in the right ureter, renal pelvis or calyces. 0.73mm sessile posterior wall bladder lesion. Diffuse 5cm erythematous lesion right lateral wall.  Indications: Patient is a 79 year old female with a history of right ureteral lesion and bladder wall thickening.  After discussing treatment options, she decided proceed with right diagnostic ureteroscopy and bladder biopsy  Procedure her in detail: The patient was brought to the operating room and a brief timeout was done to ensure correct patient, correct procedure, correct site.  General anesthesia was administered patient was placed in dorsal lithotomy position.  Her genitalia was then prepped and draped in usual sterile fashion.  A rigid 36 French cystoscope was passed in the urethra and the bladder.  Bladder was inspected and we noted a posterior wall lesion and a diffuse 6cm right lateral wall lesion. We obtained biopsies from the posterior wall and right lateral wall. Hemostasis was obtained with electrocautery.  the ureteral orifices were in the normal orthotopic locations.  a 6 french ureteral catheter was then instilled into the right ureter orifice.  a gentle retrograde was obtained and findings noted above.  we then placed a zip wire through the ureteral catheter and advanced up to the renal pelvis.  we then removed the cystoscope and cannulated the right ureteral  orifice with a semirigid ureteroscope.  we then performed ureteroscopy up to the level of the UPJ. No stone or tumor was encountered. We then advanced a sensor wire into the renal pelvis. We removed the scope and advanced a flexible ureteroscope over the sensor wire. We performed nephroscopy and not no lesions int he right kidney.   A 18 french foley was placed, the bladder was then drained and this concluded the procedure which was well tolerated by patient.  Complications: None  Condition: Stable, extubated, transferred to PACU  Plan: Pt is to followup in 1week for a voiding trial

## 2019-09-13 NOTE — Anesthesia Preprocedure Evaluation (Signed)
Anesthesia Evaluation  Patient identified by MRN, date of birth, ID band Patient awake    Reviewed: Allergy & Precautions, NPO status , Patient's Chart, lab work & pertinent test results  History of Anesthesia Complications Negative for: history of anesthetic complications  Airway Mallampati: II  TM Distance: >3 FB Neck ROM: Full    Dental  (+) Missing, Dental Advisory Given, Upper Dentures   Pulmonary neg pulmonary ROS,    Pulmonary exam normal breath sounds clear to auscultation       Cardiovascular Exercise Tolerance: Poor hypertension, Pt. on medications + CAD  Normal cardiovascular exam+ dysrhythmias Supra Ventricular Tachycardia  Rhythm:Regular Rate:Normal     Neuro/Psych  Neuromuscular disease negative psych ROS   GI/Hepatic Neg liver ROS, GERD  Medicated,  Endo/Other  diabetes, Well Controlled, Type 2, Oral Hypoglycemic AgentsHypothyroidism   Renal/GU Renal InsufficiencyRenal disease     Musculoskeletal  (+) Arthritis ,   Abdominal   Peds  Hematology  (+) anemia ,   Anesthesia Other Findings   Reproductive/Obstetrics                             Anesthesia Physical Anesthesia Plan  ASA: III  Anesthesia Plan: General   Post-op Pain Management:    Induction: Intravenous  PONV Risk Score and Plan: 4 or greater and Ondansetron  Airway Management Planned: LMA  Additional Equipment:   Intra-op Plan:   Post-operative Plan: Extubation in OR  Informed Consent: I have reviewed the patients History and Physical, chart, labs and discussed the procedure including the risks, benefits and alternatives for the proposed anesthesia with the patient or authorized representative who has indicated his/her understanding and acceptance.     Dental advisory given  Plan Discussed with: CRNA and Surgeon  Anesthesia Plan Comments:         Anesthesia Quick Evaluation

## 2019-09-13 NOTE — Anesthesia Postprocedure Evaluation (Signed)
Anesthesia Post Note  Patient: Kim Mills  Procedure(s) Performed: CYSTOSCOPY WITH RIGHT  RETROGRADE PYELOGRAM; BLADDER BIOPSY (Right Ureter)  Patient location during evaluation: PACU Anesthesia Type: General Level of consciousness: awake, oriented, awake and alert and patient cooperative Pain management: pain level controlled Vital Signs Assessment: post-procedure vital signs reviewed and stable Respiratory status: spontaneous breathing, respiratory function stable and nonlabored ventilation Cardiovascular status: blood pressure returned to baseline and stable Postop Assessment: no headache and no backache Anesthetic complications: no   No complications documented.   Last Vitals:  Vitals:   09/13/19 1304 09/13/19 1430  BP: (!) 136/53 (!) 151/57  Pulse: 64 73  Resp: 14 16  Temp: 36.8 C   SpO2: 100% 99%    Last Pain:  Vitals:   09/13/19 1304  TempSrc: Oral  PainSc: 0-No pain                 Tacy Learn

## 2019-09-13 NOTE — Anesthesia Procedure Notes (Signed)
Procedure Name: LMA Insertion Date/Time: 09/13/2019 2:46 PM Performed by: Jonna Munro, CRNA Pre-anesthesia Checklist: Patient identified, Emergency Drugs available, Suction available, Patient being monitored and Timeout performed Patient Re-evaluated:Patient Re-evaluated prior to induction Oxygen Delivery Method: Circle system utilized Preoxygenation: Pre-oxygenation with 100% oxygen Induction Type: IV induction LMA: LMA inserted LMA Size: 4.0 Number of attempts: 1 Placement Confirmation: positive ETCO2 and breath sounds checked- equal and bilateral Tube secured with: Tape Dental Injury: Teeth and Oropharynx as per pre-operative assessment

## 2019-09-13 NOTE — Transfer of Care (Signed)
Immediate Anesthesia Transfer of Care Note  Patient: Kim Mills  Procedure(s) Performed: CYSTOSCOPY WITH RIGHT  RETROGRADE PYELOGRAM; BLADDER BIOPSY (Right Ureter)  Patient Location: PACU  Anesthesia Type:General  Level of Consciousness: awake, alert , oriented and patient cooperative  Airway & Oxygen Therapy: Patient Spontanous Breathing  Post-op Assessment: Report given to RN, Post -op Vital signs reviewed and stable and Patient moving all extremities  Post vital signs: Reviewed and stable  Last Vitals:  Vitals Value Taken Time  BP    Temp    Pulse 77 09/13/19 1533  Resp 15 09/13/19 1533  SpO2 100 % 09/13/19 1533  Vitals shown include unvalidated device data.  Last Pain:  Vitals:   09/13/19 1304  TempSrc: Oral  PainSc: 0-No pain      Patients Stated Pain Goal: 5 (72/18/28 8337)  Complications: No complications documented.

## 2019-09-13 NOTE — Interval H&P Note (Signed)
History and Physical Interval Note:  09/13/2019 2:10 PM  [name]  has presented today for surgery, with the diagnosis of right ureteral lesion.  The various methods of treatment have been discussed with the patient and family. After consideration of risks, benefits and other options for treatment, the patient has consented to  Procedure(s): CYSTOSCOPY WITH Bilateral RETROGRADE PYELOGRAM/URETERAL STENT PLACEMENT (Right) URETERAL BIOPSY (Right) as a surgical intervention.  The patient's history has been reviewed, patient examined, no change in status, stable for surgery.  I have reviewed the patient's chart and labs.  Questions were answered to the patient's satisfaction.     [name]

## 2019-09-14 ENCOUNTER — Encounter (HOSPITAL_COMMUNITY): Payer: Self-pay | Admitting: Urology

## 2019-09-14 LAB — IMMUNOFIXATION ELECTROPHORESIS
IgA: 92 mg/dL (ref 64–422)
IgG (Immunoglobin G), Serum: 2679 mg/dL — ABNORMAL HIGH (ref 586–1602)
IgM (Immunoglobulin M), Srm: 14 mg/dL — ABNORMAL LOW (ref 26–217)
Total Protein ELP: 8 g/dL (ref 6.0–8.5)

## 2019-09-14 LAB — BETA 2 MICROGLOBULIN, SERUM: Beta-2 Microglobulin: 2.9 mg/L — ABNORMAL HIGH (ref 0.6–2.4)

## 2019-09-14 NOTE — Addendum Note (Signed)
Addendum  created 09/14/19 1130 by Jonna Munro, CRNA   Charge Capture section accepted

## 2019-09-17 ENCOUNTER — Ambulatory Visit: Payer: Medicare Other | Admitting: Orthopedic Surgery

## 2019-09-18 LAB — SURGICAL PATHOLOGY

## 2019-09-19 ENCOUNTER — Other Ambulatory Visit (HOSPITAL_COMMUNITY): Payer: Self-pay | Admitting: *Deleted

## 2019-09-19 DIAGNOSIS — E119 Type 2 diabetes mellitus without complications: Secondary | ICD-10-CM | POA: Diagnosis not present

## 2019-09-19 DIAGNOSIS — E538 Deficiency of other specified B group vitamins: Secondary | ICD-10-CM | POA: Diagnosis not present

## 2019-09-19 DIAGNOSIS — E039 Hypothyroidism, unspecified: Secondary | ICD-10-CM | POA: Diagnosis not present

## 2019-09-19 DIAGNOSIS — D472 Monoclonal gammopathy: Secondary | ICD-10-CM

## 2019-09-19 DIAGNOSIS — Z7984 Long term (current) use of oral hypoglycemic drugs: Secondary | ICD-10-CM | POA: Diagnosis not present

## 2019-09-19 DIAGNOSIS — D649 Anemia, unspecified: Secondary | ICD-10-CM | POA: Diagnosis not present

## 2019-09-20 ENCOUNTER — Encounter: Payer: Self-pay | Admitting: Urology

## 2019-09-20 ENCOUNTER — Other Ambulatory Visit: Payer: Self-pay

## 2019-09-20 ENCOUNTER — Ambulatory Visit (INDEPENDENT_AMBULATORY_CARE_PROVIDER_SITE_OTHER): Payer: Medicare Other | Admitting: Urology

## 2019-09-20 VITALS — BP 129/71 | HR 85 | Temp 98.2°F | Ht 62.0 in | Wt 162.5 lb

## 2019-09-20 DIAGNOSIS — R310 Gross hematuria: Secondary | ICD-10-CM | POA: Diagnosis not present

## 2019-09-20 DIAGNOSIS — I2510 Atherosclerotic heart disease of native coronary artery without angina pectoris: Secondary | ICD-10-CM | POA: Diagnosis not present

## 2019-09-20 NOTE — Patient Instructions (Signed)
Hematuria, Adult Hematuria is blood in the urine. Blood may be visible in the urine, or it may be identified with a test. This condition can be caused by infections of the bladder, urethra, kidney, or prostate. Other possible causes include:  Kidney stones.  Cancer of the urinary tract.  Too much calcium in the urine.  Conditions that are passed from parent to child (inherited conditions).  Exercise that requires a lot of energy. Infections can usually be treated with medicine, and a kidney stone usually will pass through your urine. If neither of these is the cause of your hematuria, more tests may be needed to identify the cause of your symptoms. It is very important to tell your health care provider about any blood in your urine, even if it is painless or the blood stops without treatment. Blood in the urine, when it happens and then stops and then happens again, can be a symptom of a very serious condition, including cancer. There is no pain in the initial stages of many urinary cancers. Follow these instructions at home: Medicines  Take over-the-counter and prescription medicines only as told by your health care provider.  If you were prescribed an antibiotic medicine, take it as told by your health care provider. Do not stop taking the antibiotic even if you start to feel better. Eating and drinking  Drink enough fluid to keep your urine clear or pale yellow. It is recommended that you drink 3-4 quarts (2.8-3.8 L) a day. If you have been diagnosed with an infection, it is recommended that you drink cranberry juice in addition to large amounts of water.  Avoid caffeine, tea, and carbonated beverages. These tend to irritate the bladder.  Avoid alcohol because it may irritate the prostate (men). General instructions  If you have been diagnosed with a kidney stone, follow your health care provider's instructions about straining your urine to catch the stone.  Empty your bladder  often. Avoid holding urine for long periods of time.  If you are female: ? After a bowel movement, wipe from front to back and use each piece of toilet paper only once. ? Empty your bladder before and after sex.  Pay attention to any changes in your symptoms. Tell your health care provider about any changes or any new symptoms.  It is your responsibility to get your test results. Ask your health care provider, or the department performing the test, when your results will be ready.  Keep all follow-up visits as told by your health care provider. This is important. Contact a health care provider if:  You develop back pain.  You have a fever.  You have nausea or vomiting.  Your symptoms do not improve after 3 days.  Your symptoms get worse. Get help right away if:  You develop severe vomiting and are unable take medicine without vomiting.  You develop severe pain in your back or abdomen even though you are taking medicine.  You pass a large amount of blood in your urine.  You pass blood clots in your urine.  You feel very weak or like you might faint.  You faint. Summary  Hematuria is blood in the urine. It has many possible causes.  It is very important that you tell your health care provider about any blood in your urine, even if it is painless or the blood stops without treatment.  Take over-the-counter and prescription medicines only as told by your health care provider.  Drink enough fluid to keep   your urine clear or pale yellow. This information is not intended to replace advice given to you by your health care provider. Make sure you discuss any questions you have with your health care provider. Document Revised: 06/21/2018 Document Reviewed: 02/28/2016 Elsevier Patient Education  2020 Elsevier Inc.  

## 2019-09-20 NOTE — Progress Notes (Signed)
Urological Symptom Review  Patient is experiencing the following symptoms: Frequent urination Hard to postpone urination Burning/pain with urination Blood in urine   Review of Systems  Gastrointestinal (upper)  : Negative for upper GI symptoms  Gastrointestinal (lower) : Constipation  Constitutional : Negative for symptoms  Skin: Negative for skin symptoms  Eyes: Negative for eye symptoms  Ear/Nose/Throat : Negative for Ear/Nose/Throat symptoms  Hematologic/Lymphatic: Negative for Hematologic/Lymphatic symptoms  Cardiovascular : Negative for cardiovascular symptoms  Respiratory : Cough  Endocrine: Negative for endocrine symptoms  Musculoskeletal: Negative for musculoskeletal symptoms  Neurological: Dizziness  Psychologic: Negative for psychiatric symptoms    Fill and Pull Catheter Removal  Patient is present today for a catheter removal.  Patient was cleaned and prepped in a sterile fashion 49ml of sterile water/ saline was instilled into the bladder when the patient felt the urge to urinate.  Patient voided around catheter. Orders given to pull catheter per. Dr. Alyson Ingles.62ml of water was then drained from the balloon. 47 F catheter was removed without difficulty.  Patient tolerated well.  Performed by: Mikhayla Phillis,LPN  Follow up/ Additional notes: Per MD note

## 2019-09-20 NOTE — Progress Notes (Signed)
09/20/2019 12:32 PM   Corliss Skains Bari Mantis 1940-12-21 [id number]  Referring provider: Alycia Rossetti, MD [address] [place],  [place] [address]  F/u bladder biopsy  HPI: [name] is a 79yo with a hx of UTI and gross hematuria who is here for voiding trial and followup after bladder biopsy. Pathology reviewed and was benign. She was able to urinate today. No issues with recurrent UTI   PMH: Past Medical History:  Diagnosis Date  . Arthritis   . Cataract   . Chest pain    Associated with weakness and fatigue  . Diabetes mellitus    A1c of 7.4 in 08/2010  . GERD (gastroesophageal reflux disease)   . Glaucoma   . Hyperlipidemia    Lipid profile in 08/2010:190, 121, 48, 118; normal CBC and CMet  . Hypertension    Lipid profile in 08/2010:190, 121, 48, 118; normal CBC and CMet  . Hypothyroidism   . Hypothyroidism   . Obesity     Surgical History: Past Surgical History:  Procedure Laterality Date  . CHOLECYSTECTOMY    . COLONOSCOPY N/A 04/01/2014   RMR: Melanosis coli. colonic polyps removed as described above.   . CYSTOSCOPY W/ URETERAL STENT PLACEMENT Right 09/13/2019   Procedure: CYSTOSCOPY WITH RIGHT  RETROGRADE PYELOGRAM; BLADDER BIOPSY;  Surgeon: Cleon Gustin, MD;  Location: [facility];  Service: Urology;  Laterality: Right;  . LEFT HEART CATH AND CORONARY ANGIOGRAPHY N/A 10/07/2017   Procedure: LEFT HEART CATH AND CORONARY ANGIOGRAPHY;  Surgeon: Troy Sine, MD;  Location: [facility];  Service: Cardiovascular;  Laterality: N/A;  . THYROIDECTOMY, PARTIAL      Home Medications:  Allergies as of 09/20/2019   No Known Allergies     Medication List       Accurate as of September 20, 2019 12:32 PM. If you have any questions, ask your nurse or doctor.        amLODipine 10 MG tablet Commonly known as: NORVASC Take 1 tablet (10 mg total) by mouth daily.   Blood Glucose System Pak Kit Please dispense based on patient and insurance preference. Use  as directed to monitor FSBS 2x daily. Dx: E11.9.   BLOOD GLUCOSE TEST STRIPS Strp Please dispense based on patient and insurance preference. Use as directed to monitor FSBS 2x daily. Dx: E11.9.   cyanocobalamin 1000 MCG/ML injection Commonly known as: (VITAMIN B-12) Inject 1 mL (1,000 mcg total) into the muscle every 30 (thirty) days.   gabapentin 300 MG capsule Commonly known as: NEURONTIN Take 1 capsule (300 mg total) by mouth at bedtime.   Garlique 400 MG Tbec Generic drug: Garlic Take 440 mg by mouth every evening.   glipiZIDE 5 MG tablet Commonly known as: GLUCOTROL Take 1 tablet (5 mg total) by mouth 2 (two) times daily.   hydrochlorothiazide 12.5 MG capsule Commonly known as: MICROZIDE TAKE ONE CAPSULE BY MOUTH DAILY   Lancets Misc Please dispense based on patient and insurance preference. Use as directed to monitor FSBS 2x daily. Dx: E11.9.   loratadine 10 MG tablet Commonly known as: CLARITIN Take 1 tablet (10 mg total) by mouth daily.   metFORMIN 1000 MG tablet Commonly known as: GLUCOPHAGE TAKE ONE TABLET BY MOUTH TWICE A DAY What changed: when to take this   metoprolol tartrate 25 MG tablet Commonly known as: LOPRESSOR Take 0.5 tablets (12.5 mg total) by mouth 2 (two) times daily.   OMEGA-3 FISH OIL PO Take 1 capsule  by mouth every evening.   omeprazole 40 MG capsule Commonly known as: PRILOSEC TAKE ONE CAPSULE BY MOUTH DAILY   simvastatin 40 MG tablet Commonly known as: ZOCOR TAKE ONE TABLET (40MG TOTAL) BY MOUTH BEDTIME What changed: See the new instructions.   Synthroid 137 MCG tablet Generic drug: levothyroxine Take 1 tablet (137 mcg total) by mouth daily before breakfast.   Synthroid 150 MCG tablet Generic drug: levothyroxine Take 150 mcg by mouth daily.   tolterodine 4 MG 24 hr capsule Commonly known as: DETROL LA TAKE ONE CAPSULE BY MOUTH DAILY   traMADol 50 MG tablet Commonly known as: ULTRAM Take 1 tablet (50 mg total) by mouth 2  (two) times daily as needed.   traMADol 50 MG tablet Commonly known as: Ultram Take 1 tablet (50 mg total) by mouth every 6 (six) hours as needed.       Allergies: No Known Allergies  Family History: Family History  Problem Relation Age of Onset  . Anemia Father   . Arthritis Mother   . Stroke Brother   . Cancer Sister        unknown kind  . Diabetes Sister   . Dementia Sister   . Cancer Niece        Breast  . Cancer Brother   . Healthy Son   . Healthy Son   . Healthy Son   . Healthy Daughter   . Healthy Daughter   . Healthy Daughter   . Sarcoidosis Daughter     Social History:  reports that she is a non-smoker but has been exposed to tobacco smoke. She has never used smokeless tobacco. She reports that she does not drink alcohol and does not use drugs.  ROS: All other review of systems were reviewed and are negative except what is noted above in HPI  Physical Exam: BP 129/71   Pulse 85   Temp 98.2 F (36.8 C)   Ht 5' 2"  (1.575 m)   Wt 162 lb 8 oz (73.7 kg)   BMI 29.72 kg/m   Constitutional:  Alert and oriented, No acute distress. HEENT: [place] AT, moist mucus membranes.  Trachea midline, no masses. Cardiovascular: No clubbing, cyanosis, or edema. Respiratory: Normal respiratory effort, no increased work of breathing. GI: Abdomen is soft, nontender, nondistended, no abdominal masses GU: No CVA tenderness.  Lymph: No cervical or inguinal lymphadenopathy. Skin: No rashes, bruises or suspicious lesions. Neurologic: Grossly intact, no focal deficits, moving all 4 extremities. Psychiatric: Normal mood and affect.  Laboratory Data: Lab Results  Component Value Date   WBC 11.3 (H) 09/11/2019   HGB 9.0 (L) 09/11/2019   HCT 29.4 (L) 09/11/2019   MCV 90.2 09/11/2019   PLT 275 09/11/2019    Lab Results  Component Value Date   CREATININE 0.84 09/04/2019    No results found for: PSA  No results found for: TESTOSTERONE  Lab Results  Component Value Date    HGBA1C 6.9 (H) 08/17/2019    Urinalysis    Component Value Date/Time   COLORURINE YELLOW 08/17/2019 1547   APPEARANCEUR CLOUDY (A) 08/17/2019 1547   LABSPEC 1.025 08/17/2019 1547   PHURINE 6.5 08/17/2019 1547   GLUCOSEU NEGATIVE 08/17/2019 1547   HGBUR TRACE (A) 08/17/2019 1547   BILIRUBINUR neg 08/27/2019 1455   KETONESUR NEGATIVE 08/17/2019 1547   PROTEINUR Negative 08/27/2019 1455   PROTEINUR 2+ (A) 08/17/2019 1547   UROBILINOGEN 2.0 (A) 08/27/2019 1455   NITRITE neg 08/27/2019 1455   NITRITE NEGATIVE 08/17/2019  Thor (1+) (A) 08/27/2019 1455   LEUKOCYTESUR 2+ (A) 08/17/2019 1547    Lab Results  Component Value Date   BACTERIA MANY (A) 08/17/2019    Pertinent Imaging:  No results found for this or any previous visit.  No results found for this or any previous visit.  No results found for this or any previous visit.  No results found for this or any previous visit.  No results found for this or any previous visit.  No results found for this or any previous visit.  Results for orders placed during the hospital encounter of 08/09/19  CT HEMATURIA WORKUP  Narrative CLINICAL DATA:  Gross hematuria, 2 episodes in February and March of this year.  EXAM: CT ABDOMEN AND PELVIS WITHOUT AND WITH CONTRAST  TECHNIQUE: Multidetector CT imaging of the abdomen and pelvis was performed following the standard protocol before and following the bolus administration of intravenous contrast.  CONTRAST:  133m OMNIPAQUE IOHEXOL 300 MG/ML  SOLN  COMPARISON:  05/11/2019  FINDINGS: Lower chest: Incidental imaging of the lung bases is unremarkable.  Hepatobiliary: Post cholecystectomy. No focal, suspicious hepatic lesion. Portal vein is patent. No biliary duct dilation.  Pancreas: Pancreas is normal without focal lesion or ductal dilation.  Spleen: Spleen normal size without focal lesion.  Adrenals/Urinary Tract: Adrenal glands are normal.  Renal  contours are smooth. No suspicious renal lesion. Mild thickening along the bladder base without nodular features though the bladder is under distended.  No signs of abnormal enhancement along the course of the ureters. No nephrolithiasis.  Urothelial thickening on the RIGHT. Column of Bertin noted in the interpolar RIGHT kidney. Caliceal elements in this area appear irregular and or truncated best seen on image 67 of series 15 and 65 of series 15 in the setting of urothelial thickening involving infundibular elements and the renal pelvis of the RIGHT kidney.  Stomach/Bowel: No acute gastric or small bowel process. Stomach is under distended limiting assessment.  Stool fills much of the colon.  Vascular/Lymphatic: Calcified and noncalcified plaque of the abdominal aorta. No aneurysmal dilation. No adenopathy.  No pelvic lymphadenopathy.  Reproductive: Uterus with calcified leiomyomata in the uterine body.  Other: Small fat containing umbilical hernia. No abdominal or pelvic ascites.  Musculoskeletal: Spinal degenerative changes. No acute or destructive bone process.  IMPRESSION: 1. Irregular appearance of interpolar collecting systems and urothelium of the renal pelvis. While inflammation could be an explanation for these findings in the setting of painless gross hematuria upper tract lesion is considered. There is also a column of Bertin extending into the renal pelvis which confound slightly the examination ureteroscopic assessment may be helpful. 2. Mild circumferential thickening of the urinary bladder perhaps slightly more prominent along the bladder base. This could be assessed at cystoscopy but is likely due to under distension. 3. No nephrolithiasis or suspicious renal lesion. 4. Post cholecystectomy. 5. Aortic atherosclerosis.  Aortic Atherosclerosis (ICD10-I70.0).   Electronically Signed By: GZetta BillsM.D. On: 08/10/2019 13:54  Results for orders  placed during the hospital encounter of 05/11/19  CT RENAL STONE STUDY  Narrative CLINICAL DATA:  Hematuria for 1 month  EXAM: CT ABDOMEN AND PELVIS WITHOUT CONTRAST  TECHNIQUE: Multidetector CT imaging of the abdomen and pelvis was performed following the standard protocol without IV contrast.  COMPARISON:  None.  FINDINGS: Lower chest: No acute pleural or parenchymal lung disease. Minimal subpleural scarring and fibrosis at the lung bases.  Hepatobiliary: No focal liver abnormality is seen.  Status post cholecystectomy. No biliary dilatation.  Pancreas: Unremarkable. No pancreatic ductal dilatation or surrounding inflammatory changes.  Spleen: Normal in size without focal abnormality.  Adrenals/Urinary Tract: No urinary tract calculi or obstructive uropathy within either kidney. Small cortical cyst medial aspect lower pole right kidney. Mild bilateral adrenal thickening consistent with hyperplasia.  There is gas in the bladder lumen, please correlate with any recent catheterization.  Stomach/Bowel: No bowel obstruction or ileus. Normal appendix right lower quadrant.  Vascular/Lymphatic: Aortic atherosclerosis. No enlarged abdominal or pelvic lymph nodes.  Reproductive: Calcified uterine fibroids are noted. No adnexal masses.  Other: Small fat containing umbilical hernia. No free fluid or free gas.  Musculoskeletal: No acute or destructive bony lesions. Reconstructed images demonstrate no additional findings.  IMPRESSION: 1. No urinary tract calculi or obstructive uropathy. 2. Gas in the bladder lumen, please correlate with any recent catheterization. 3. Fat containing umbilical hernia. 4.  Aortic Atherosclerosis (ICD10-I70.0). 5. Calcified uterine fibroids.   Electronically Signed By: Randa Ngo M.D. On: 05/11/2019 18:35   Assessment & Plan:    1. Gross hematuria -Gross hematuria was related to UTI and bladder biopsy pathology was benign. I  will see her back in 6 months with UA. She is instructed to return sooner if she develops UTI symptoms.    Return in about 6 months (around 03/22/2020) for UA.  [name], MD  [facility] [place]

## 2019-09-21 LAB — UIFE/LIGHT CHAINS/TP QN, 24-HR UR
FR KAPPA LT CH,24HR: 40.29 mg/24 hr
FR LAMBDA LT CH,24HR: 4.82 mg/24 hr
Free Kappa Lt Chains,Ur: 25.18 mg/L (ref 0.63–113.79)
Free Kappa/Lambda Ratio: 8.37 (ref 1.03–31.76)
Free Lambda Lt Chains,Ur: 3.01 mg/L (ref 0.47–11.77)
Total Protein, Urine-Ur/day: 339 mg/24 hr — ABNORMAL HIGH (ref 30–150)
Total Protein, Urine: 21.2 mg/dL
Total Volume: 1600

## 2019-09-26 ENCOUNTER — Other Ambulatory Visit: Payer: Self-pay | Admitting: Radiology

## 2019-09-27 ENCOUNTER — Other Ambulatory Visit: Payer: Self-pay

## 2019-09-27 ENCOUNTER — Ambulatory Visit (HOSPITAL_COMMUNITY)
Admission: RE | Admit: 2019-09-27 | Discharge: 2019-09-27 | Disposition: A | Discharge status: Home / Self Care | Payer: Medicare Other | Source: Ambulatory Visit | Attending: Hematology | Admitting: Hematology

## 2019-09-27 ENCOUNTER — Ambulatory Visit (HOSPITAL_COMMUNITY)
Admission: RE | Admit: 2019-09-27 | Discharge: 2019-09-27 | Disposition: A | Discharge status: Home / Self Care | Payer: Medicare Other | Source: Ambulatory Visit | Attending: Urology | Admitting: Urology

## 2019-09-27 ENCOUNTER — Encounter (HOSPITAL_COMMUNITY): Payer: Self-pay

## 2019-09-27 DIAGNOSIS — C9000 Multiple myeloma not having achieved remission: Secondary | ICD-10-CM | POA: Insufficient documentation

## 2019-09-27 DIAGNOSIS — Z79899 Other long term (current) drug therapy: Secondary | ICD-10-CM | POA: Diagnosis not present

## 2019-09-27 DIAGNOSIS — M8580 Other specified disorders of bone density and structure, unspecified site: Secondary | ICD-10-CM | POA: Insufficient documentation

## 2019-09-27 DIAGNOSIS — E785 Hyperlipidemia, unspecified: Secondary | ICD-10-CM | POA: Diagnosis not present

## 2019-09-27 DIAGNOSIS — I10 Essential (primary) hypertension: Secondary | ICD-10-CM | POA: Diagnosis not present

## 2019-09-27 DIAGNOSIS — M899 Disorder of bone, unspecified: Secondary | ICD-10-CM | POA: Diagnosis not present

## 2019-09-27 DIAGNOSIS — K219 Gastro-esophageal reflux disease without esophagitis: Secondary | ICD-10-CM | POA: Diagnosis not present

## 2019-09-27 DIAGNOSIS — D649 Anemia, unspecified: Secondary | ICD-10-CM | POA: Insufficient documentation

## 2019-09-27 DIAGNOSIS — Z79890 Hormone replacement therapy (postmenopausal): Secondary | ICD-10-CM | POA: Insufficient documentation

## 2019-09-27 DIAGNOSIS — E119 Type 2 diabetes mellitus without complications: Secondary | ICD-10-CM | POA: Insufficient documentation

## 2019-09-27 DIAGNOSIS — E039 Hypothyroidism, unspecified: Secondary | ICD-10-CM | POA: Diagnosis not present

## 2019-09-27 DIAGNOSIS — E669 Obesity, unspecified: Secondary | ICD-10-CM | POA: Insufficient documentation

## 2019-09-27 DIAGNOSIS — D472 Monoclonal gammopathy: Secondary | ICD-10-CM

## 2019-09-27 DIAGNOSIS — Z7984 Long term (current) use of oral hypoglycemic drugs: Secondary | ICD-10-CM | POA: Diagnosis not present

## 2019-09-27 LAB — CBC WITH DIFFERENTIAL/PLATELET
Abs Immature Granulocytes: 0.14 10*3/uL — ABNORMAL HIGH (ref 0.00–0.07)
Basophils Absolute: 0 10*3/uL (ref 0.0–0.1)
Basophils Relative: 0 %
Eosinophils Absolute: 0.2 10*3/uL (ref 0.0–0.5)
Eosinophils Relative: 2 %
HCT: 27.3 % — ABNORMAL LOW (ref 36.0–46.0)
Hemoglobin: 8.3 g/dL — ABNORMAL LOW (ref 12.0–15.0)
Immature Granulocytes: 2 %
Lymphocytes Relative: 42 %
Lymphs Abs: 3.9 10*3/uL (ref 0.7–4.0)
MCH: 26.9 pg (ref 26.0–34.0)
MCHC: 30.4 g/dL (ref 30.0–36.0)
MCV: 88.6 fL (ref 80.0–100.0)
Monocytes Absolute: 0.5 10*3/uL (ref 0.1–1.0)
Monocytes Relative: 6 %
Neutro Abs: 4.4 10*3/uL (ref 1.7–7.7)
Neutrophils Relative %: 48 %
Platelets: 239 10*3/uL (ref 150–400)
RBC: 3.08 MIL/uL — ABNORMAL LOW (ref 3.87–5.11)
RDW: 19.3 % — ABNORMAL HIGH (ref 11.5–15.5)
WBC: 9.2 10*3/uL (ref 4.0–10.5)
nRBC: 1.3 % — ABNORMAL HIGH (ref 0.0–0.2)

## 2019-09-27 LAB — GLUCOSE, CAPILLARY: Glucose-Capillary: 154 mg/dL — ABNORMAL HIGH (ref 70–99)

## 2019-09-27 LAB — APTT: aPTT: 23 seconds — ABNORMAL LOW (ref 24–36)

## 2019-09-27 LAB — PROTIME-INR
INR: 1 (ref 0.8–1.2)
Prothrombin Time: 13.2 seconds (ref 11.4–15.2)

## 2019-09-27 MED ORDER — FENTANYL CITRATE (PF) 100 MCG/2ML IJ SOLN
INTRAMUSCULAR | Status: AC | PRN
  Administered 2019-09-27 (×2): 25 ug via INTRAVENOUS
  Administered 2019-09-27: 50 ug via INTRAVENOUS

## 2019-09-27 MED ORDER — FLUMAZENIL 0.5 MG/5ML IV SOLN
INTRAVENOUS | Status: AC
  Filled 2019-09-27: qty 5

## 2019-09-27 MED ORDER — MIDAZOLAM HCL 2 MG/2ML IJ SOLN
INTRAMUSCULAR | Status: AC | PRN
  Administered 2019-09-27 (×2): 1 mg via INTRAVENOUS

## 2019-09-27 MED ORDER — NALOXONE HCL 0.4 MG/ML IJ SOLN
INTRAMUSCULAR | Status: AC
  Filled 2019-09-27: qty 1

## 2019-09-27 MED ORDER — LIDOCAINE HCL (PF) 1 % IJ SOLN
INTRAMUSCULAR | Status: AC | PRN
  Administered 2019-09-27: 10 mL via INTRADERMAL

## 2019-09-27 MED ORDER — FENTANYL CITRATE (PF) 100 MCG/2ML IJ SOLN
INTRAMUSCULAR | Status: AC
  Filled 2019-09-27: qty 2

## 2019-09-27 MED ORDER — MIDAZOLAM HCL 2 MG/2ML IJ SOLN
INTRAMUSCULAR | Status: AC
  Filled 2019-09-27: qty 2

## 2019-09-27 MED ORDER — SODIUM CHLORIDE 0.9 % IV SOLN: INTRAVENOUS | Status: DC

## 2019-09-27 NOTE — H&P (Signed)
Chief Complaint: Patient was seen in consultation today for monoclonal gammopathy/bone marrow biopsy and aspiration.  Referring Physician(s): Katragadda,Sreedhar  Supervising Physician: Corrie Mckusick  Patient Status: [facility] - Out-pt  History of Present Illness: Kim Mills is a 79 y.o. female with a past medical history of hypertension, hyperlipidemia, GERD, diabetes mellitus, hypothyroidism, glaucoma, cataracts, arthritis, and obesity. She was referred to hematology/oncology by her PCP for management of normocytic anemia. Further lab work-up revealed monoclonal gammopathy, thought to be the cause of normocytic anemia. At this time, it is recommended that patient undergo bone marrow biopsy/aspiration for further work-up for monoclonal gammopathy.  IR requested by Dr. Delton Coombes for possible image-guided bone marrow biopsy/aspiration. Patient awake and alert sitting in bed watching TV with no complaints at this time. Denies fever, chills, chest pain, dyspnea, abdominal pain, or headache.   Past Medical History:  Diagnosis Date  . Arthritis   . Cataract   . Chest pain    Associated with weakness and fatigue  . Diabetes mellitus    A1c of 7.4 in 08/2010  . GERD (gastroesophageal reflux disease)   . Glaucoma   . Hyperlipidemia    Lipid profile in 08/2010:190, 121, 48, 118; normal CBC and CMet  . Hypertension    Lipid profile in 08/2010:190, 121, 48, 118; normal CBC and CMet  . Hypothyroidism   . Hypothyroidism   . Obesity     Past Surgical History:  Procedure Laterality Date  . CHOLECYSTECTOMY    . COLONOSCOPY N/A 04/01/2014   RMR: Melanosis coli. colonic polyps removed as described above.   . CYSTOSCOPY W/ URETERAL STENT PLACEMENT Right 09/13/2019   Procedure: CYSTOSCOPY WITH RIGHT  RETROGRADE PYELOGRAM; BLADDER BIOPSY;  Surgeon: Cleon Gustin, MD;  Location: [facility];  Service: Urology;  Laterality: Right;  . LEFT HEART CATH AND CORONARY ANGIOGRAPHY N/A 10/07/2017    Procedure: LEFT HEART CATH AND CORONARY ANGIOGRAPHY;  Surgeon: Troy Sine, MD;  Location: [facility];  Service: Cardiovascular;  Laterality: N/A;  . THYROIDECTOMY, PARTIAL      Allergies: Patient has no known allergies.  Medications: Prior to Admission medications   Medication Sig Start Date End Date Taking? Authorizing Provider  amLODipine (NORVASC) 10 MG tablet Take 1 tablet (10 mg total) by mouth daily. 04/20/19   Alycia Rossetti, MD  Blood Glucose Monitoring Suppl (BLOOD GLUCOSE SYSTEM PAK) KIT Please dispense based on patient and insurance preference. Use as directed to monitor FSBS 2x daily. Dx: E11.9. 04/12/18   Alycia Rossetti, MD  cyanocobalamin (,VITAMIN B-12,) 1000 MCG/ML injection Inject 1 mL (1,000 mcg total) into the muscle every 30 (thirty) days. 04/20/18   Alycia Rossetti, MD  gabapentin (NEURONTIN) 300 MG capsule Take 1 capsule (300 mg total) by mouth at bedtime. 04/24/19   [place], Modena Nunnery, MD  Garlic (GARLIQUE) 883 MG TBEC Take 400 mg by mouth every evening.     [provider]  glipiZIDE (GLUCOTROL) 5 MG tablet Take 1 tablet (5 mg total) by mouth 2 (two) times daily. 12/28/18   Alycia Rossetti, MD  Glucose Blood (BLOOD GLUCOSE TEST STRIPS) STRP Please dispense based on patient and insurance preference. Use as directed to monitor FSBS 2x daily. Dx: E11.9. 04/12/18   Alycia Rossetti, MD  hydrochlorothiazide (MICROZIDE) 12.5 MG capsule TAKE ONE CAPSULE BY MOUTH DAILY Patient taking differently: Take 12.5 mg by mouth daily.  06/28/19   Alycia Rossetti, MD  Lancets MISC Please dispense based on patient  and insurance preference. Use as directed to monitor FSBS 2x daily. Dx: E11.9. 04/12/18   Alycia Rossetti, MD  loratadine (CLARITIN) 10 MG tablet Take 1 tablet (10 mg total) by mouth daily. 08/17/19   [place], Modena Nunnery, MD  metFORMIN (GLUCOPHAGE) 1000 MG tablet TAKE ONE TABLET BY MOUTH TWICE A DAY Patient taking differently: Take 1,000 mg by mouth 2 (two)  times daily with a meal.  12/15/18   [place], Modena Nunnery, MD  metoprolol tartrate (LOPRESSOR) 25 MG tablet Take 0.5 tablets (12.5 mg total) by mouth 2 (two) times daily. 04/23/19   [place], Modena Nunnery, MD  Omega-3 Fatty Acids (OMEGA-3 FISH OIL PO) Take 1 capsule by mouth every evening.     [provider]  omeprazole (PRILOSEC) 40 MG capsule TAKE ONE CAPSULE BY MOUTH DAILY Patient taking differently: Take 40 mg by mouth daily.  05/31/19   Alycia Rossetti, MD  simvastatin (ZOCOR) 40 MG tablet TAKE ONE TABLET ($RemoveBef'40MG'NAIfWzvhRX$  TOTAL) BY MOUTH BEDTIME Patient taking differently: Take 40 mg by mouth at bedtime.  02/27/19   Alycia Rossetti, MD  SYNTHROID 137 MCG tablet Take 1 tablet (137 mcg total) by mouth daily before breakfast. 08/20/19   Alycia Rossetti, MD  SYNTHROID 150 MCG tablet Take 150 mcg by mouth daily. 09/14/19   [provider]  tolterodine (DETROL LA) 4 MG 24 hr capsule TAKE ONE CAPSULE BY MOUTH DAILY Patient taking differently: Take 4 mg by mouth daily.  04/24/19   [place], Modena Nunnery, MD  traMADol (ULTRAM) 50 MG tablet Take 1 tablet (50 mg total) by mouth 2 (two) times daily as needed. 08/17/19   , Modena Nunnery, MD  traMADol (ULTRAM) 50 MG tablet Take 1 tablet (50 mg total) by mouth every 6 (six) hours as needed. 09/13/19 09/12/20  Cleon Gustin, MD     Family History  Problem Relation Age of Onset  . Anemia Father   . Arthritis Mother   . Stroke Brother   . Cancer Sister        unknown kind  . Diabetes Sister   . Dementia Sister   . Cancer Niece        Breast  . Cancer Brother   . Healthy Son   . Healthy Son   . Healthy Son   . Healthy Daughter   . Healthy Daughter   . Healthy Daughter   . Sarcoidosis Daughter     Social History   Socioeconomic History  . Marital status: Widowed    Spouse name: Not on file  . Number of children: 7  . Years of education: Not on file  . Highest education level: Not on file  Occupational History    Employer: RETIRED  Tobacco  Use  . Smoking status: Passive Smoke Exposure - Never Smoker  . Smokeless tobacco: Never Used  Vaping Use  . Vaping Use: Never used  Substance and Sexual Activity  . Alcohol use: Never  . Drug use: Never  . Sexual activity: Not Currently  Other Topics Concern  . Not on file  Social History Narrative  . Not on file   Social Determinants of Health   Financial Resource Strain:   . Difficulty of Paying Living Expenses: Not on file  Food Insecurity:   . Worried About Charity fundraiser in the Last Year: Not on file  . Ran Out of Food in the Last Year: Not on file  Transportation Needs:   . Lack of Transportation (  Medical): Not on file  . Lack of Transportation (Non-Medical): Not on file  Physical Activity:   . Days of Exercise per Week: Not on file  . Minutes of Exercise per Session: Not on file  Stress:   . Feeling of Stress : Not on file  Social Connections:   . Frequency of Communication with Friends and Family: Not on file  . Frequency of Social Gatherings with Friends and Family: Not on file  . Attends Religious Services: Not on file  . Active Member of Clubs or Organizations: Not on file  . Attends Archivist Meetings: Not on file  . Marital Status: Not on file     Review of Systems: A 12 point ROS discussed and pertinent positives are indicated in the HPI above.  All other systems are negative.  Review of Systems  Constitutional: Negative for chills and fever.  Respiratory: Negative for shortness of breath and wheezing.   Cardiovascular: Negative for chest pain and palpitations.  Gastrointestinal: Negative for abdominal pain.  Neurological: Negative for headaches.  Psychiatric/Behavioral: Negative for behavioral problems and confusion.    Vital Signs: BP (!) 144/48 (BP Location: Right Arm)   Pulse 73   Temp 98.5 F (36.9 C) (Oral)   Resp 15   SpO2 100%   Physical Exam Vitals and nursing note reviewed.  Constitutional:      General: She is not  in acute distress.    Appearance: Normal appearance.  Cardiovascular:     Rate and Rhythm: Normal rate and regular rhythm.     Heart sounds: Normal heart sounds. No murmur heard.   Pulmonary:     Effort: Pulmonary effort is normal. No respiratory distress.     Breath sounds: Normal breath sounds. No wheezing.  Skin:    General: Skin is warm and dry.  Neurological:     Mental Status: She is alert and oriented to person, place, and time.      MD Evaluation Airway: WNL Heart: WNL Abdomen: WNL Chest/ Lungs: WNL ASA  Classification: 2 Mallampati/Airway Score: Two   Imaging: DG Bone Survey Met  Result Date: 09/13/2019 CLINICAL DATA:  Monoclonal gammopathy.  Bony pain. EXAM: METASTATIC BONE SURVEY COMPARISON:  CT 08/09/2019. FINDINGS: Standard imaging of the axial and appendicular skeleton performed. Diffuse osteopenia. Very subtle lucencies noted in the skull, right clavicle, distal left humerus, right and left femurs. Diffuse degenerative change. IMPRESSION: 1. Diffuse osteopenia. Very subtle lucencies noted the skull, right clavicle, distal left humerus, right and left femurs. Findings suggest the possibility of multiple myeloma and or metastatic disease. 2.  Diffuse degenerative change. Electronically Signed   By: Marcello Moores  Register   On: 09/13/2019 05:41   DG C-Arm 1-60 Min-No Report  Result Date: 09/13/2019 Fluoroscopy was utilized by the requesting physician.  No radiographic interpretation.    Labs:  CBC: Recent Labs    08/21/19 1457 09/04/19 1438 09/11/19 1451 09/27/19 0909  WBC 8.8 11.4* 11.3* 9.2  HGB 6.9* 6.9* 9.0* 8.3*  HCT 23.4* 23.9* 29.4* 27.3*  PLT 267 294 275 239    COAGS: Recent Labs    09/27/19 0909  INR 1.0  APTT 23*    BMP: Recent Labs    04/13/19 1241 04/17/19 1610 04/25/19 1930 04/25/19 1930 05/15/19 1422 08/09/19 1735 08/17/19 1547 09/04/19 1438  NA 140   < > 137  --  139  --  141 139  K 4.9   < > 4.5  --  4.0  --  3.5 3.3*  CL 107    < > 104  --  103  --  106 107  CO2 19*   < > 24  --  26  --  26 24  GLUCOSE 115*   < > 155*  --  171*  --  212* 219*  BUN 30*   < > 39*  --  22  --  15 19  CALCIUM 9.5   < > 9.5  --  9.4  --  9.1 9.2  CREATININE 1.26*   < > 1.29*   < > 1.01* 1.00 0.88 0.84  GFRNONAA 41*  --  40*  --  54*  --   --  >60  GFRAA 48*  --  47*  --  >60  --   --  >60   < > = values in this interval not displayed.    LIVER FUNCTION TESTS: Recent Labs    04/13/19 1241 04/25/19 1930 08/17/19 1547 09/04/19 1438  BILITOT 0.6 0.9 0.5 0.5  AST 28 26 15 15   ALT 9 31 12 11   ALKPHOS  --  78  --  59  PROT 7.6 8.0 7.5 8.1  ALBUMIN  --  3.7  --  3.4*     Assessment and Plan:  Normocytic anemia presumed secondary to monoclonal gammopathy. Plan for image-guided bone marrow biopsy/aspiration today in IR. Patient is NPO. Afebrile.  Risks and benefits discussed with the patient including, but not limited to bleeding, infection, damage to adjacent structures or low yield requiring additional tests. All of the patient's questions were answered, patient is agreeable to proceed. Consent signed and in chart.   Thank you for this interesting consult.  I greatly enjoyed meeting MURLE HELLSTROM and look forward to participating in their care.  A copy of this report was sent to the requesting provider on this date.  Electronically Signed: Earley Abide, PA-C 09/27/2019, 10:00 AM   I spent a total of 30 Minutes in face to face in clinical consultation, greater than 50% of which was counseling/coordinating care for monoclonal gammopathy/bone marrow biopsy and aspiration.

## 2019-09-27 NOTE — Discharge Instructions (Signed)
Please call Interventional Radiology clinic [phone] with any questions or concerns.  You may remove your dressing and shower tomorrow.   Moderate Conscious Sedation, Adult, Care After These instructions provide you with information about caring for yourself after your procedure. Your health care provider may also give you more specific instructions. Your treatment has been planned according to current medical practices, but problems sometimes occur. Call your health care provider if you have any problems or questions after your procedure. What can I expect after the procedure? After your procedure, it is common:  To feel sleepy for several hours.  To feel clumsy and have poor balance for several hours.  To have poor judgment for several hours.  To vomit if you eat too soon. Follow these instructions at home: For at least 24 hours after the procedure:   Do not: ? Participate in activities where you could fall or become injured. ? Drive. ? Use heavy machinery. ? Drink alcohol. ? Take sleeping pills or medicines that cause drowsiness. ? Make important decisions or sign legal documents. ? Take care of children on your own.  Rest. Eating and drinking  Follow the diet recommended by your health care provider.  If you vomit: ? Drink water, juice, or soup when you can drink without vomiting. ? Make sure you have little or no nausea before eating solid foods. General instructions  Have a responsible adult stay with you until you are awake and alert.  Take over-the-counter and prescription medicines only as told by your health care provider.  If you smoke, do not smoke without supervision.  Keep all follow-up visits as told by your health care provider. This is important. Contact a health care provider if:  You keep feeling nauseous or you keep vomiting.  You feel light-headed.  You develop a rash.  You have a fever. Get help right away if:  You have trouble  breathing. This information is not intended to replace advice given to you by your health care provider. Make sure you discuss any questions you have with your health care provider. Document Revised: 01/07/2017 Document Reviewed: 05/17/2015 Elsevier Patient Education  2020 [place].    Bone Marrow Aspiration and Bone Marrow Biopsy, Adult, Care After This sheet gives you information about how to care for yourself after your procedure. Your health care provider may also give you more specific instructions. If you have problems or questions, contact your health care provider. What can I expect after the procedure? After the procedure, it is common to have:  Mild pain and tenderness.  Swelling.  Bruising. Follow these instructions at home: Puncture site care   Follow instructions from your health care provider about how to take care of the puncture site. Make sure you: ? Wash your hands with soap and water before and after you change your bandage (dressing). If soap and water are not available, use hand sanitizer. ? Change your dressing as told by your health care provider.  Check your puncture site every day for signs of infection. Check for: ? More redness, swelling, or pain. ? Fluid or blood. ? Warmth. ? Pus or a bad smell. Activity  Return to your normal activities as told by your health care provider. Ask your health care provider what activities are safe for you.  Do not lift anything that is heavier than 10 lb (4.5 kg), or the limit that you are told, until your health care provider says that it is safe.  Do not drive for  24 hours if you were given a sedative during your procedure. General instructions   Take over-the-counter and prescription medicines only as told by your health care provider.  Do not take baths, swim, or use a hot tub until your health care provider approves. Ask your health care provider if you may take showers. You may only be allowed to take  sponge baths.  If directed, put ice on the affected area. To do this: ? Put ice in a plastic bag. ? Place a towel between your skin and the bag. ? Leave the ice on for 20 minutes, 2-3 times a day.  Keep all follow-up visits as told by your health care provider. This is important. Contact a health care provider if:  Your pain is not controlled with medicine.  You have a fever.  You have more redness, swelling, or pain around the puncture site.  You have fluid or blood coming from the puncture site.  Your puncture site feels warm to the touch.  You have pus or a bad smell coming from the puncture site. Summary  After the procedure, it is common to have mild pain, tenderness, swelling, and bruising.  Follow instructions from your health care provider about how to take care of the puncture site and what activities are safe for you.  Take over-the-counter and prescription medicines only as told by your health care provider.  Contact a health care provider if you have any signs of infection, such as fluid or blood coming from the puncture site. This information is not intended to replace advice given to you by your health care provider. Make sure you discuss any questions you have with your health care provider. Document Revised: 06/13/2018 Document Reviewed: 06/13/2018 Elsevier Patient Education  [place].

## 2019-09-27 NOTE — Procedures (Signed)
Interventional Radiology Procedure Note  Procedure: CT guided aspirate and core biopsy of right posterior iliac bone Complications: None Recommendations: - Bedrest supine x 1 hrs - OTC's PRN  Pain - Follow biopsy results  Signed,  Madysyn Hanken S. Bertel Venard, DO    

## 2019-10-03 ENCOUNTER — Ambulatory Visit (HOSPITAL_COMMUNITY): Payer: Medicare Other | Admitting: Hematology

## 2019-10-05 ENCOUNTER — Encounter (HOSPITAL_COMMUNITY): Payer: Self-pay | Admitting: Hematology

## 2019-10-05 ENCOUNTER — Other Ambulatory Visit: Payer: Self-pay | Admitting: Family Medicine

## 2019-10-08 ENCOUNTER — Other Ambulatory Visit: Payer: Self-pay | Admitting: Urology

## 2019-10-10 ENCOUNTER — Ambulatory Visit: Payer: Medicare Other | Admitting: Orthopedic Surgery

## 2019-10-11 ENCOUNTER — Telehealth: Payer: Self-pay | Admitting: Family Medicine

## 2019-10-11 ENCOUNTER — Other Ambulatory Visit: Payer: Self-pay

## 2019-10-11 ENCOUNTER — Inpatient Hospital Stay (HOSPITAL_COMMUNITY): Payer: Medicare Other | Attending: Hematology | Admitting: Hematology

## 2019-10-11 ENCOUNTER — Other Ambulatory Visit (HOSPITAL_COMMUNITY): Payer: Self-pay | Admitting: *Deleted

## 2019-10-11 VITALS — BP 126/44 | HR 68 | Temp 96.9°F | Resp 18

## 2019-10-11 DIAGNOSIS — I10 Essential (primary) hypertension: Secondary | ICD-10-CM | POA: Insufficient documentation

## 2019-10-11 DIAGNOSIS — E538 Deficiency of other specified B group vitamins: Secondary | ICD-10-CM | POA: Diagnosis not present

## 2019-10-11 DIAGNOSIS — E119 Type 2 diabetes mellitus without complications: Secondary | ICD-10-CM | POA: Insufficient documentation

## 2019-10-11 DIAGNOSIS — M25551 Pain in right hip: Secondary | ICD-10-CM | POA: Insufficient documentation

## 2019-10-11 DIAGNOSIS — E039 Hypothyroidism, unspecified: Secondary | ICD-10-CM | POA: Insufficient documentation

## 2019-10-11 DIAGNOSIS — R296 Repeated falls: Secondary | ICD-10-CM | POA: Insufficient documentation

## 2019-10-11 DIAGNOSIS — D649 Anemia, unspecified: Secondary | ICD-10-CM | POA: Diagnosis not present

## 2019-10-11 DIAGNOSIS — Z5112 Encounter for antineoplastic immunotherapy: Secondary | ICD-10-CM | POA: Diagnosis not present

## 2019-10-11 DIAGNOSIS — Z993 Dependence on wheelchair: Secondary | ICD-10-CM | POA: Insufficient documentation

## 2019-10-11 DIAGNOSIS — Z7722 Contact with and (suspected) exposure to environmental tobacco smoke (acute) (chronic): Secondary | ICD-10-CM | POA: Diagnosis not present

## 2019-10-11 DIAGNOSIS — C9000 Multiple myeloma not having achieved remission: Secondary | ICD-10-CM | POA: Diagnosis not present

## 2019-10-11 DIAGNOSIS — Z7189 Other specified counseling: Secondary | ICD-10-CM | POA: Diagnosis not present

## 2019-10-11 DIAGNOSIS — Z7984 Long term (current) use of oral hypoglycemic drugs: Secondary | ICD-10-CM | POA: Insufficient documentation

## 2019-10-11 MED ORDER — HYDROCODONE-ACETAMINOPHEN 5-325 MG PO TABS
1.0000 | ORAL_TABLET | Freq: Four times a day (QID) | ORAL | 0 refills | Status: DC | PRN
Start: 2019-10-11 — End: 2019-12-11

## 2019-10-11 NOTE — Patient Instructions (Signed)
[facility] at [facility] Discharge Instructions  You were seen today by Dr. Delton Coombes. He went over your recent results. You will be started on a treatment regimen for your multiple myeloma, consisting of Revlimid, dexamethasone and Velcade. You will be prescribed Norco to take for the pain; purchase stool softeners over the counter and take daily to prevent constipation. Purchase aspirin 81 mg over the counter take 1 tablet daily. You will also be prescribed acyclovir to take daily. Dr. Delton Coombes will see you back in 2 weeks for labs and follow up.   Thank you for choosing [facility] at [facility] to provide your oncology and hematology care.  To afford each patient quality time with our provider, please arrive at least 15 minutes before your scheduled appointment time.   If you have a lab appointment with the [facility] please come in thru the Main Entrance and check in at the main information desk  You need to re-schedule your appointment should you arrive 10 or more minutes late.  We strive to give you quality time with our providers, and arriving late affects you and other patients whose appointments are after yours.  Also, if you no show three or more times for appointments you may be dismissed from the clinic at the providers discretion.     Again, thank you for choosing [facility].  Our hope is that these requests will decrease the amount of time that you wait before being seen by our physicians.       _____________________________________________________________  Should you have questions after your visit to [facility], please contact our office at [phone] between the hours of 8:00 a.m. and 4:30 p.m.  Voicemails left after 4:00 p.m. will not be returned until the following business day.  For prescription refill requests, have your pharmacy contact our office and allow 72 hours.    Cancer Center Support  Programs:   > Cancer Support Group  2nd Tuesday of the month 1pm-2pm, Journey Room

## 2019-10-11 NOTE — Progress Notes (Signed)
  Chronic Care Management   Note  10/11/2019 Name: Kim Mills MRN: [id number] DOB: May 12, 1940  Kim Mills is a 79 y.o. year old female who is a primary care patient of [place], Modena Nunnery, MD. I reached out to Kim Mills by phone today in response to a referral sent by Ms. Dorna Mai Ashkar's PCP, Buelah Manis, Modena Nunnery, MD.   Ms. Agramonte was given information about Chronic Care Management services today including:  1. CCM service includes personalized support from designated clinical staff supervised by her physician, including individualized plan of care and coordination with other care providers 2. 24/7 contact phone numbers for assistance for urgent and routine care needs. 3. Service will only be billed when office clinical staff spend 20 minutes or more in a month to coordinate care. 4. Only one practitioner may furnish and bill the service in a calendar month. 5. The patient may stop CCM services at any time (effective at the end of the month) by phone call to the office staff.   Patient agreed to services and verbal consent obtained.   Follow up plan:   Carley Perdue [employer] Scheduler

## 2019-10-11 NOTE — Progress Notes (Signed)
[place], [place] [address]   CLINIC:  Medical Oncology/Hematology  PCP:  Alycia Rossetti, MD [address] [place] [address]  [phone]  REASON FOR VISIT:  Follow-up for normocytic anemia and multiple myeloma  PRIOR THERAPY: None  CURRENT THERAPY: Iron tablets BID  INTERVAL HISTORY:  [name], a 79 y.o. female, returns for routine follow-up for her normocytic anemia and multiple myeloma. Kim [place] was last seen on 09/12/2019.  Today she is accompanied by her daughter. She reports that the knee pain is chronic, but the right hip pain is worsening since the biopsy; the tramadol is not helping alleviate the pain. She has not tried taking Norco before. She spends most of her time in the wheelchair after having multiple falls.  She lives with her daughter who works as a Wellsite geologist. Her maternal cousin deceased from some kind of cancer; her sister had some kind of cancer.   REVIEW OF SYSTEMS:  Review of Systems  Constitutional: Positive for appetite change (moderately decreased) and fatigue (severe).  Respiratory: Positive for cough.   Gastrointestinal: Positive for constipation and nausea.  Musculoskeletal: Positive for arthralgias (9/10 R hip pain and bilat knees pain).  Neurological: Positive for dizziness and numbness (feet).  All other systems reviewed and are negative.   PAST MEDICAL/SURGICAL HISTORY:  Past Medical History:  Diagnosis Date  . Arthritis   . Cataract   . Chest pain    Associated with weakness and fatigue  . Diabetes mellitus    A1c of 7.4 in 08/2010  . GERD (gastroesophageal reflux disease)   . Glaucoma   . Hyperlipidemia    Lipid profile in 08/2010:190, 121, 48, 118; normal CBC and CMet  . Hypertension    Lipid profile in 08/2010:190, 121, 48, 118; normal CBC and CMet  . Hypothyroidism   . Hypothyroidism   . Obesity    Past Surgical History:  Procedure  Laterality Date  . CHOLECYSTECTOMY    . COLONOSCOPY N/A 04/01/2014   RMR: Melanosis coli. colonic polyps removed as described above.   . CYSTOSCOPY W/ URETERAL STENT PLACEMENT Right 09/13/2019   Procedure: CYSTOSCOPY WITH RIGHT  RETROGRADE PYELOGRAM; BLADDER BIOPSY;  Surgeon: Cleon Gustin, MD;  Location: [facility];  Service: Urology;  Laterality: Right;  . LEFT HEART CATH AND CORONARY ANGIOGRAPHY N/A 10/07/2017   Procedure: LEFT HEART CATH AND CORONARY ANGIOGRAPHY;  Surgeon: Troy Sine, MD;  Location: [facility];  Service: Cardiovascular;  Laterality: N/A;  . THYROIDECTOMY, PARTIAL      SOCIAL HISTORY:  Social History   Socioeconomic History  . Marital status: Widowed    Spouse name: Not on file  . Number of children: 7  . Years of education: Not on file  . Highest education level: Not on file  Occupational History    Employer: RETIRED  Tobacco Use  . Smoking status: Passive Smoke Exposure - Never Smoker  . Smokeless tobacco: Never Used  Vaping Use  . Vaping Use: Never used  Substance and Sexual Activity  . Alcohol use: Never  . Drug use: Never  . Sexual activity: Not Currently  Other Topics Concern  . Not on file  Social History Narrative  . Not on file   Social Determinants of Health   Financial Resource Strain:   . Difficulty of Paying Living Expenses: Not on file  Food Insecurity:   . Worried About Running  Out of Food in the Last Year: Not on file  . Ran Out of Food in the Last Year: Not on file  Transportation Needs:   . Lack of Transportation (Medical): Not on file  . Lack of Transportation (Non-Medical): Not on file  Physical Activity:   . Days of Exercise per Week: Not on file  . Minutes of Exercise per Session: Not on file  Stress:   . Feeling of Stress : Not on file  Social Connections:   . Frequency of Communication with Friends and Family: Not on file  . Frequency of Social Gatherings with Friends and Family: Not on file  . Attends  Religious Services: Not on file  . Active Member of Clubs or Organizations: Not on file  . Attends Archivist Meetings: Not on file  . Marital Status: Not on file  Intimate Partner Violence:   . Fear of Current or Ex-Partner: Not on file  . Emotionally Abused: Not on file  . Physically Abused: Not on file  . Sexually Abused: Not on file    FAMILY HISTORY:  Family History  Problem Relation Age of Onset  . Anemia Father   . Arthritis Mother   . Stroke Brother   . Cancer Sister        unknown kind  . Diabetes Sister   . Dementia Sister   . Cancer Niece        Breast  . Cancer Brother   . Healthy Son   . Healthy Son   . Healthy Son   . Healthy Daughter   . Healthy Daughter   . Healthy Daughter   . Sarcoidosis Daughter     CURRENT MEDICATIONS:  Current Outpatient Medications  Medication Sig Dispense Refill  . amLODipine (NORVASC) 10 MG tablet Take 1 tablet (10 mg total) by mouth daily. 90 tablet 3  . Blood Glucose Monitoring Suppl (BLOOD GLUCOSE SYSTEM PAK) KIT Please dispense based on patient and insurance preference. Use as directed to monitor FSBS 2x daily. Dx: E11.9. 1 each 1  . cyanocobalamin (,VITAMIN B-12,) 1000 MCG/ML injection Inject 1 mL (1,000 mcg total) into the muscle every 30 (thirty) days. 1 mL 0  . gabapentin (NEURONTIN) 300 MG capsule Take 1 capsule (300 mg total) by mouth at bedtime. 90 capsule 3  . Garlic (GARLIQUE) 681 MG TBEC Take 400 mg by mouth every evening.     Marland Kitchen glipiZIDE (GLUCOTROL) 5 MG tablet Take 1 tablet (5 mg total) by mouth 2 (two) times daily. 180 tablet 3  . Glucose Blood (BLOOD GLUCOSE TEST STRIPS) STRP Please dispense based on patient and insurance preference. Use as directed to monitor FSBS 2x daily. Dx: E11.9. 100 each 11  . HM LORATADINE 10 MG tablet TAKE ONE TABLET (10MG TOTAL) BY MOUTH DAILY 30 tablet 0  . hydrochlorothiazide (MICROZIDE) 12.5 MG capsule TAKE ONE CAPSULE BY MOUTH DAILY (Patient taking differently: Take 12.5 mg  by mouth daily. ) 90 capsule 3  . Lancets MISC Please dispense based on patient and insurance preference. Use as directed to monitor FSBS 2x daily. Dx: E11.9. 100 each 11  . metFORMIN (GLUCOPHAGE) 1000 MG tablet TAKE ONE TABLET BY MOUTH TWICE A DAY 60 tablet 3  . metoprolol tartrate (LOPRESSOR) 25 MG tablet Take 0.5 tablets (12.5 mg total) by mouth 2 (two) times daily. 90 tablet 3  . Omega-3 Fatty Acids (OMEGA-3 FISH OIL PO) Take 1 capsule by mouth every evening.     Marland Kitchen omeprazole (PRILOSEC) 40  MG capsule TAKE ONE CAPSULE BY MOUTH DAILY (Patient taking differently: Take 40 mg by mouth daily. ) 90 capsule 3  . simvastatin (ZOCOR) 40 MG tablet TAKE ONE TABLET (40MG TOTAL) BY MOUTH BEDTIME (Patient taking differently: Take 40 mg by mouth at bedtime. ) 90 tablet 3  . SYNTHROID 150 MCG tablet Take 150 mcg by mouth daily.    Marland Kitchen tolterodine (DETROL LA) 4 MG 24 hr capsule TAKE ONE CAPSULE BY MOUTH DAILY (Patient taking differently: Take 4 mg by mouth daily. ) 90 capsule 3  . traMADol (ULTRAM) 50 MG tablet Take 1 tablet (50 mg total) by mouth every 6 (six) hours as needed. 30 tablet 0  . SYNTHROID 137 MCG tablet Take 1 tablet (137 mcg total) by mouth daily before breakfast. (Patient not taking: Reported on 10/11/2019) 30 tablet 3   No current facility-administered medications for this visit.    ALLERGIES:  No Known Allergies  PHYSICAL EXAM:  Performance status (ECOG): 2 - Symptomatic, <50% confined to bed  Vitals:   10/11/19 1527  BP: (!) 126/44  Pulse: 68  Resp: 18  Temp: (!) 96.9 F (36.1 C)  SpO2: 96%   Wt Readings from Last 3 Encounters:  09/20/19 162 lb 8 oz (73.7 kg)  09/12/19 162 lb 8 oz (73.7 kg)  09/11/19 178 lb (80.7 kg)   Physical Exam Vitals reviewed.  Constitutional:      Appearance: Normal appearance.  Neurological:     General: No focal deficit present.     Mental Status: She is alert and oriented to person, place, and time.  Psychiatric:        Mood and Affect: Mood  normal.        Behavior: Behavior normal.     LABORATORY DATA:  I have reviewed the labs as listed.  CBC Latest Ref Rng & Units 09/27/2019 09/11/2019 09/04/2019  WBC 4.0 - 10.5 K/uL 9.2 11.3(H) 11.4(H)  Hemoglobin 12.0 - 15.0 g/dL 8.3(L) 9.0(L) 6.9(LL)  Hematocrit 36 - 46 % 27.3(L) 29.4(L) 23.9(L)  Platelets 150 - 400 K/uL 239 275 294   CMP Latest Ref Rng & Units 09/04/2019 08/17/2019 08/09/2019  Glucose 70 - 99 mg/dL 219(H) 212(H) -  BUN 8 - 23 mg/dL 19 15 -  Creatinine 0.44 - 1.00 mg/dL 0.84 0.88 1.00  Sodium 135 - 145 mmol/L 139 141 -  Potassium 3.5 - 5.1 mmol/L 3.3(L) 3.5 -  Chloride 98 - 111 mmol/L 107 106 -  CO2 22 - 32 mmol/L 24 26 -  Calcium 8.9 - 10.3 mg/dL 9.2 9.1 -  Total Protein 6.5 - 8.1 g/dL 8.1 7.5 -  Total Bilirubin 0.3 - 1.2 mg/dL 0.5 0.5 -  Alkaline Phos 38 - 126 U/L 59 - -  AST 15 - 41 U/L 15 15 -  ALT 0 - 44 U/L 11 12 -      Component Value Date/Time   RBC 3.08 (L) 09/27/2019 0909   MCV 88.6 09/27/2019 0909   MCH 26.9 09/27/2019 0909   MCHC 30.4 09/27/2019 0909   RDW 19.3 (H) 09/27/2019 0909   LYMPHSABS 3.9 09/27/2019 0909   MONOABS 0.5 09/27/2019 0909   EOSABS 0.2 09/27/2019 0909   BASOSABS 0.0 09/27/2019 0909   Surgical pathology ([id number]) on 09/27/2019: Bone marrow aspirate: hypercellular marrow with 70% plasma cell neoplasm.  DIAGNOSTIC IMAGING:  I have independently reviewed the scans and discussed with the patient. CT Biopsy  Result Date: 09/27/2019 INDICATION: 79 year old female with a history monoclonal gammopathy EXAM:  CT BIOPSY; CT ASPIRATION BONE MARROW ONLY MEDICATIONS: None. ANESTHESIA/SEDATION: Moderate (conscious) sedation was employed during this procedure. A total of Versed 2.0 mg and Fentanyl 100 mcg was administered intravenously. Moderate Sedation Time: 14 minutes. The patient's level of consciousness and vital signs were monitored continuously by radiology nursing throughout the procedure under my direct supervision. FLUOROSCOPY  TIME:  CT COMPLICATIONS: None PROCEDURE: The procedure risks, benefits, and alternatives were explained to the patient. Questions regarding the procedure were encouraged and answered. The patient understands and consents to the procedure. Scout CT of the pelvis was performed for surgical planning purposes. The right posterior pelvis was prepped with Chlorhexidine in a sterile fashion, and a sterile drape was applied covering the operative field. A sterile gown and sterile gloves were used for the procedure. Local anesthesia was provided with 1% Lidocaine. Posterior iliac bone was targeted for biopsy. The skin and subcutaneous tissues were infiltrated with 1% lidocaine without epinephrine. A small stab incision was made with an 11 blade scalpel, and an 11 gauge Murphy needle was advanced with CT guidance to the posterior cortex. Manual forced was used to advance the needle through the posterior cortex and the stylet was removed. A bone marrow aspirate was retrieved and passed to a cytotechnologist in the room. The Murphy needle was then advanced without the stylet for a core biopsy. The core biopsy was retrieved and also passed to a cytotechnologist. A second core biopsy was retrieved and passed to cytotechnologist. Manual pressure was used for hemostasis and a sterile dressing was placed. No complications were encountered no significant blood loss was encountered. Patient tolerated the procedure well and remained hemodynamically stable throughout. IMPRESSION: Status post CT-guided bone marrow biopsy, with tissue specimen sent to pathology for complete histopathologic analysis Signed, Dulcy Fanny. Earleen Newport, DO [facility] Electronically Signed   By: Corrie Mckusick D.O.   On: 09/27/2019 11:52   DG Bone Survey Met  Result Date: 09/13/2019 CLINICAL DATA:  Monoclonal gammopathy.  Bony pain. EXAM: METASTATIC BONE SURVEY COMPARISON:  CT 08/09/2019. FINDINGS: Standard  imaging of the axial and appendicular skeleton performed. Diffuse osteopenia. Very subtle lucencies noted in the skull, right clavicle, distal left humerus, right and left femurs. Diffuse degenerative change. IMPRESSION: 1. Diffuse osteopenia. Very subtle lucencies noted the skull, right clavicle, distal left humerus, right and left femurs. Findings suggest the possibility of multiple myeloma and or metastatic disease. 2.  Diffuse degenerative change. Electronically Signed   By: Marcello Moores  Register   On: 09/13/2019 05:41   CT BONE MARROW ASPIRATION  Result Date: 09/27/2019 INDICATION: 79 year old female with a history monoclonal gammopathy EXAM: CT BIOPSY; CT ASPIRATION BONE MARROW ONLY MEDICATIONS: None. ANESTHESIA/SEDATION: Moderate (conscious) sedation was employed during this procedure. A total of Versed 2.0 mg and Fentanyl 100 mcg was administered intravenously. Moderate Sedation Time: 14 minutes. The patient's level of consciousness and vital signs were monitored continuously by radiology nursing throughout the procedure under my direct supervision. FLUOROSCOPY TIME:  CT COMPLICATIONS: None PROCEDURE: The procedure risks, benefits, and alternatives were explained to the patient. Questions regarding the procedure were encouraged and answered. The patient understands and consents to the procedure. Scout CT of the pelvis was performed for surgical planning purposes. The right posterior pelvis was prepped with Chlorhexidine in a sterile fashion, and a sterile drape was applied covering the operative field. A sterile gown and sterile gloves were used for the procedure. Local anesthesia was provided with 1% Lidocaine. Posterior iliac bone was targeted  for biopsy. The skin and subcutaneous tissues were infiltrated with 1% lidocaine without epinephrine. A small stab incision was made with an 11 blade scalpel, and an 11 gauge Murphy needle was advanced with CT guidance to the posterior cortex. Manual forced was used  to advance the needle through the posterior cortex and the stylet was removed. A bone marrow aspirate was retrieved and passed to a cytotechnologist in the room. The Murphy needle was then advanced without the stylet for a core biopsy. The core biopsy was retrieved and also passed to a cytotechnologist. A second core biopsy was retrieved and passed to cytotechnologist. Manual pressure was used for hemostasis and a sterile dressing was placed. No complications were encountered no significant blood loss was encountered. Patient tolerated the procedure well and remained hemodynamically stable throughout. IMPRESSION: Status post CT-guided bone marrow biopsy, with tissue specimen sent to pathology for complete histopathologic analysis Signed, Dulcy Fanny. Earleen Newport, DO [facility] Electronically Signed   By: Corrie Mckusick D.O.   On: 09/27/2019 11:52   DG C-Arm 1-60 Min-No Report  Result Date: 09/13/2019 Fluoroscopy was utilized by the requesting physician.  No radiographic interpretation.     ASSESSMENT:  1. Normocytic anemia: -Patient seen at the request of Dr. Buelah Manis for further work-up and management of normocytic anemia. -Recent CBC on 08/21/2019 shows hemoglobin 6.9 with MCV of 88.3. White count and platelets are normal. No history of CKD. Denies any bleeding per rectum or melena. Last colonoscopy on 04/01/2014 shows diffusely pigmented rectal mucosa consistent with melanosis [name] Diffusely pigmented colonic mucosa, 2 diminutive polyps in the mid ascending segment, otherwise the remainder of the colon mucosa was normal. Biopsy consistent with tubular adenoma. -CT renal study on 05/11/2019 shows normal-sized spleen with normal liver. No other abnormal adenopathy. -Denies any prior history of blood transfusion. She is currently taking iron tablet twice daily.  2.  IgG lambda plasma cell myeloma: -Skeletal survey on  09/12/2019 shows diffuse faint lucencies in the skull, bilateral femurs. -Bone marrow biopsy on 09/27/2019 shows 70% plasma cells in hypercellular marrow.  Chromosome analysis was 43, XX. -LDH normal, beta-2 microglobulin 2.9.  M spike was 2.1 g.  Kappa light chains 19.7, lambda light chains 14.3 with ratio of 1.38. -24-hour urine shows nonnephrotic range proteinuria.  Urine immunofixation was positive.   PLAN:  1.  IgG lambda plasma cell myeloma: -Reviewed the bone marrow biopsy results which showed 70% plasma cells.  Chromosome analysis was normal.  FISH panel is pending. -We talked about the normal prognosis and treatment of multiple myeloma.  She is restricted to wheelchair because of frequent falls and knee arthritis.  She will be a poor candidate for bone marrow transplant. -We talked about initiating therapy for her myeloma with RVD regimen.  We will start with Revlimid 15 mg 2 weeks on 1 week off.  Dexamethasone will be 20 mg weekly.  We will choose Velcade weekly regimen. -We talked about the side effects in detail.  We will start her as soon as next week. -We will send her acyclovir 400 mg twice daily.  I have also asked her to start taking aspirin 81 mg daily.  2. Diabetes: -Continue glipizide and Metformin.  3. Hypertension: -Continue amlodipine and HCTZ.  4. Hypothyroidism: -Continue Synthroid daily.  5. B12 deficiency: -Continue B12 monthly injections.  6.  Right posterior hip pain: -She is currently taking tramadol 50 mg every 6 hours which is not helping. -We will  send her hydrocodone 5/325 mg to be taken every 6 hours as needed.   Orders placed this encounter:  No orders of the defined types were placed in this encounter.  Total time spent is 40 minutes with more than 50% of the time spent face-to-face discussing new diagnosis, treatment plan, counseling and coordination of care.  Derek Jack, MD [facility] [phone]   I, Milinda Antis, am acting as a scribe for Dr. Sanda Linger.  I, Derek Jack MD, have reviewed the above documentation for accuracy and completeness, and I agree with the above.

## 2019-10-12 ENCOUNTER — Other Ambulatory Visit (HOSPITAL_COMMUNITY): Payer: Self-pay

## 2019-10-12 MED ORDER — LENALIDOMIDE 15 MG PO CAPS: 15.0000 mg | ORAL_CAPSULE | Freq: Every day | ORAL | 0 refills | Status: DC

## 2019-10-12 NOTE — Telephone Encounter (Signed)
Prescription for 15mg  Revlimid for a 28 day cycle ordered per Dr. Tomie China verbal order. Prescription faxed to [facility].

## 2019-10-13 DIAGNOSIS — C9000 Multiple myeloma not having achieved remission: Secondary | ICD-10-CM | POA: Insufficient documentation

## 2019-10-13 DIAGNOSIS — Z7189 Other specified counseling: Secondary | ICD-10-CM | POA: Insufficient documentation

## 2019-10-13 MED ORDER — ACYCLOVIR 400 MG PO TABS: 400.0000 mg | ORAL_TABLET | Freq: Two times a day (BID) | ORAL | 6 refills | Status: DC

## 2019-10-13 NOTE — Progress Notes (Signed)
START ON PATHWAY REGIMEN - Multiple Myeloma and Other Plasma Cell Dyscrasias     A cycle is every 21 days:     Bortezomib      Lenalidomide      Dexamethasone   **Always confirm dose/schedule in your pharmacy ordering system**  Patient Characteristics: Multiple Myeloma, Newly Diagnosed, Transplant Ineligible or Refused, Unknown or Awaiting Test Results Disease Classification: Multiple Myeloma R-ISS Staging: Unknown Therapeutic Status: Newly Diagnosed Is Patient Eligible for Transplant<= Transplant Ineligible or Refused Risk Status: Awaiting Test Results Intent of Therapy: Non-Curative / Palliative Intent, Discussed with Patient 

## 2019-10-19 ENCOUNTER — Encounter (HOSPITAL_COMMUNITY): Payer: Self-pay

## 2019-10-22 DIAGNOSIS — H401131 Primary open-angle glaucoma, bilateral, mild stage: Secondary | ICD-10-CM | POA: Diagnosis not present

## 2019-10-22 DIAGNOSIS — H04123 Dry eye syndrome of bilateral lacrimal glands: Secondary | ICD-10-CM | POA: Diagnosis not present

## 2019-10-23 ENCOUNTER — Other Ambulatory Visit (HOSPITAL_COMMUNITY): Payer: Self-pay

## 2019-10-23 DIAGNOSIS — C9000 Multiple myeloma not having achieved remission: Secondary | ICD-10-CM

## 2019-10-23 LAB — SURGICAL PATHOLOGY

## 2019-10-26 ENCOUNTER — Encounter (HOSPITAL_COMMUNITY): Payer: Self-pay

## 2019-10-26 ENCOUNTER — Other Ambulatory Visit (HOSPITAL_COMMUNITY): Payer: Self-pay

## 2019-10-26 DIAGNOSIS — C9000 Multiple myeloma not having achieved remission: Secondary | ICD-10-CM

## 2019-10-26 MED ORDER — PROCHLORPERAZINE MALEATE 10 MG PO TABS: 10.0000 mg | ORAL_TABLET | Freq: Four times a day (QID) | ORAL | 1 refills | Status: DC | PRN

## 2019-10-26 MED ORDER — ACYCLOVIR 400 MG PO TABS: 400.0000 mg | ORAL_TABLET | Freq: Two times a day (BID) | ORAL | 6 refills | Status: DC

## 2019-10-26 NOTE — Patient Instructions (Signed)
[place] are diagnosed with multiple myeloma.  You will be treated weekly in the clinic with an injection of bortezomib (Velcade).  You will take a steroid, dexamethasone, weekly at home, or we will administer in the clinic prior to giving you the Velcade injection.  You will also have a chemotherapy pill that you will take at home - lenalidomide (Revlimid).  You will take this drug for three weeks, have a week off from taking the chemo pill, then start taking again the following week for three weeks.  You will repeat that process each cycle.  The intent of treatment is to control your cancer, prevent it from spreading further, and to help alleviate any symptoms you may be having related to your disease.  You will see the doctor regularly throughout treatment.  We will obtain blood work from you prior to every treatment and monitor your results to make sure it is safe to give your treatment. The doctor monitors your response to treatment by the way you are feeling, your blood work, and by obtaining scans periodically.  There will be wait times while you are here for treatment.  It will take about 30 minutes to 1 hour for your lab work to result.  Then there will be wait times while pharmacy mixes your medications.    Bortezomib (Velcade)  About This Drug  Bortezomib is used to treat cancer. It is given in the vein (IV) or by a shot under the skin (subcutaneously).  You will receive this injection under your skin.  Possible Side Effects  . Bone marrow suppression. Decrease in the number of white blood cells, red blood cells, and platelets. This may raise your risk of infection, make you tired and weak (fatigue), and raise your risk of bleeding.  . Nausea and vomiting (throwing up)  . Constipation (not able to move bowels)  . Diarrhea (loose bowel movements)  . Fever  . Tiredness  . Decreased appetite (decreased hunger)  . Effects on the nerves  are called peripheral neuropathy. You may feel numbness, tingling, or pain in your hands and feet. It may be hard for you to button your clothes, open jars, or walk as usual. The effect on the nerves may get worse with more doses of the drug. These effects get better in some people after the drug is stopped but it does not get better in all people.  . Rash  Note: Each of the side effects above was reported in 20% or greater of patients treated with bortezomib. Not all possible side effects are included above.  Warnings and Precautions  . Severe peripheral neuropathy  . Low blood pressure  . Congestive heart failure - your heart has less ability to pump blood properly.  . Trouble breathing because of fluid build-up and/or inflammation in your lungs  . Nausea, vomiting, diarrhea and constipation which sometimes requires treatment to help lessen these side effects. There is also an increased risk of developing a partial or complete blockage of your small and/or large intestine.  . Changes in your central nervous system can happen. The central nervous system is made up of your brain and spinal cord. You could feel extreme tiredness, agitation, confusion, have hallucinations (see or hear things that are not there), trouble understanding or speaking, loss of control of your bowels or bladder, eyesight changes, numbness or lack of strength to your arms, legs, face, or body, seizures or coma. If you start to  have any of these symptoms let your doctor know right away.   . Tumor lysis syndrome: This drug may act on the cancer cells very quickly. This may affect how your kidneys work.  . Changes in your liver function  Increased risk of a syndrome that affects your red blood cells, platelets and blood vessels in your kidneys, which can cause kidney failure and be life-threatening.  Important Information  . This drug may be present in the saliva, tears, sweat, urine, stool, vomit, semen, and vaginal  secretions. Talk to your doctor and/or your nurse about the necessary precautions to take during this time.  . This drug may impair your ability to drive or use machinery. Use caution and tell your nurse or doctor if you feel dizzy, very sleepy, and/or experience low blood pressure.  Treating Side Effects  . Manage tiredness by pacing your activities for the day.  . Be sure to include periods of rest between energy-draining activities.  . To decrease the risk of infection, wash your hands regularly.  . Avoid close contact with people who have a cold, the flu, or other infections.  . Take your temperature as your doctor or nurse tells you, and whenever you feel like you may have a fever.  . To help decrease the risk of bleeding, use a soft toothbrush. Check with your nurse before using dental floss.  . Be very careful when using knives or tools.  . Use an electric shaver instead of a razor.  . Ask your doctor or nurse about medicines that are available to help stop or lessen constipation.  . If you are not able to move your bowels, check with your doctor or nurse before you use enemas, laxatives, or suppositories.  . Drink plenty of fluids (a minimum of eight glasses per day is recommended).  . If you throw up or have loose bowel movements, you should drink more fluids so that you do not become dehydrated (lack of water in the body from losing too much fluid).  . If you have diarrhea, eat low-fiber foods that are high in protein and calories and avoid foods that can irritate your digestive tracts or lead to cramping.  . Ask your nurse or doctor about medicine that can lessen or stop your diarrhea.  . To help with nausea and vomiting, eat small, frequent meals instead of three large meals a day. Choose foods and drinks that are at room temperature. Ask your nurse or doctor about other helpful tips and medicine that is available to help stop or lessen these symptoms.  . To help with  decreased appetite, eat foods high in calories and protein, such as meat, poultry, fish, dry beans, tofu, eggs, nuts, milk, yogurt, cheese, ice cream, pudding, and nutritional supplements.  . Consider using sauces and spices to increase taste. Daily exercise, with your doctor's approval, may increase your appetite.  . If you have numbness and tingling in your hands and feet, be careful when cooking, walking, and handling sharp objects and hot liquids.  . If you get a rash do not put anything on it unless your doctor or nurse says you may. Keep the area around the rash clean and dry. Ask your doctor for medicine if your rash bothers you.  Food and Drug Interactions  . This drug may interact with grapefruit and grapefruit juice. Talk to your doctor as this could make side effects worse.  . Check with your doctor or pharmacist about all other prescription  medicines and over-the-counter medicines and dietary supplements (vitamins, minerals, herbs and others) you are taking before starting this medicine as there are known drug interactions with bortezomib. Also, check with your doctor or pharmacist before starting any new prescription or over-the-counter medicines, or dietary supplements to make sure that there are no interactions.  . Avoid the use of St. John's Wort with bortezomib as this may lower the levels of the drug in your body, which can make it less effective.  When to Call the Doctor  Call your doctor or nurse if you have any of these symptoms and/or any new or unusual symptoms:  . Fever of 100.4 F (38 C) or higher  . Chills  . Tiredness that interferes with your daily activities  . Feeling dizzy or lightheaded  . Feeling that your heart is beating in a fast or not normal way (palpitations)  . Cough  . Wheezing or trouble breathing  . Easy bleeding or bruising  . Confusion and/or agitation  . Hallucinations  . Trouble understanding or speaking  . Blurry vision or  changes in your eyesight  . Numbness or lack of strength to your arms, legs, face, or body  . Symptoms of a seizure such as confusion, blacking out, passing out, loss of hearing or vision, blurred vision, unusual smells or tastes (such as burning rubber), trouble talking, tremors or shaking in parts or all of the body, repeated body movements, tense muscles that do not relax, and loss of control of urine and bowels. If you or your family member suspects you are having a seizure, call 911 right away.  . Nausea that stops you from eating or drinking and/or is not relieved by prescribed medicines  . Throwing up  . Lasting loss of appetite or rapid weight loss of five pounds in a week  . No bowel movement in 3 days or when you feel uncomfortable.  . Abdominal pain that does not go away  . Diarrhea, 4 times in one day or diarrhea with lack of strength or a feeling of being dizzy  . Numbness, tingling, or pain your hands and feet  . Swelling of legs, ankles, and/or feet  . Weight gain of 5 pounds in one week (fluid retention)  . Decreased urine, or very dark urine  . New rash and/or itching  . Rash that is not relieved by prescribed medicines  . Signs of tumor lysis: Confusion or agitation, decreased urine, nausea/vomiting, diarrhea, muscle cramping, numbness and/or tingling, seizures.  . Signs of possible liver problems: dark urine, pale bowel movements, bad stomach pain, feeling very tired and weak, unusual itching, or yellowing of the eyes or skin  . If you think you are pregnant or may have impregnated your partner  Reproduction Warnings  . Pregnancy warning: This drug can have harmful effects on the unborn baby. Women of child bearing potential should use effective methods of birth control during your cancer treatment and for at least 7 months after treatment. Men with female partners of childbearing potential should use effective methods of birth control during your cancer  treatment and for at least 4 months after your cancer treatment. Let your doctor know right away if you think you may be pregnant or may have impregnated your partner.  . Breastfeeding warning: Women should not breastfeed during treatment and for 2 months month after treatment because this drug could enter the breast milk and cause harm to a breastfeeding baby.  . Fertility warning: In men and  women both, this drug may affect your ability to have children in the future. Talk with your doctor or nurse if you plan to have children. Ask for information on sperm or egg banking.   Lenalidomide (Revlimid)  About This Drug Lenalidomide is used to treat cancer. It is given orally (by mouth).  Possible Side Effects . Bone marrow suppression. This is a decrease in the number of white blood cells, red blood cells, and platelets. This may raise your risk of infection, make you tired and weak (fatigue), and raise your risk of bleeding.  . Nausea  . Diarrhea (loose bowel movements)  . Constipation (unable to move bowels)  . Inflammation of your stomach and/or intestines  . Pain in your abdomen or back pain  . Fever  . Tiredness and weakness  . Swelling of your legs, ankles and/or feet  . Decreased appetite (decreased hunger)  . Muscle cramps/spasms  . Pain in your joints  . Headache  . Feeling dizzy  . Tremor  . Trouble sleeping  . Nosebleed  . Upper respiratory infection, bronchitis  . Inflammation of the nasal passages and throat  . Trouble breathing  . Cough  . Rash and itching  Note: Each of the side effects above was reported in 15% or greater of patients treated with lenalidomide. Not all possible side effects are included above.  Warnings and Precautions . Blood clots and events such as stroke and heart attack. A blood clot in your leg may cause your leg to swell, appear red and warm, and/or cause pain. A blood clot in your lungs may cause trouble breathing, pain  when breathing, and/or chest pain.  . Severe bone marrow suppression  . Changes in your liver function, which may cause liver failure and be life-threatening.  . Tumor lysis syndrome: This drug may act on the cancer cells very quickly. This may affect how your kidneys work and can be life-threatening.  . Changes in your thyroid function  . Severe allergic skin reaction which may be life-threatening. You may develop blisters on your skin that are filled with fluid or a severe red rash all over your body that may be painful.  . This drug may raise your risk of getting a second cancer.  . You may develop a syndrome called tumor flare reaction. You may have painful lymph nodes, enlarged spleen, fever, and a rash.  . This drug may make it more difficult to collect your stem cells if a stem cell transplant is part of your treatment plan.  . There is a rare increased risk of death in patients with chronic lymphocytic leukemia and a risk of early death (dying sooner) in patient with mantle cell lymphoma.  . Allergic reactions, including anaphylaxis are rare but may happen in some patients. Signs of allergic reaction to this drug may be swelling of the face, feeling like your tongue or throat are swelling, trouble breathing, rash, itching, fever, chills, feeling dizzy, and/or feeling that your heart is beating in a fast or not normal way. If this happens, do not take another dose of this drug. You should get urgent medical treatment.  Note: Some of the side effects above are very rare. If you have concerns and/or questions, please discuss them with your medical team.  Important Information . You will need to sign up for a special program called Revlimid REMS when you start taking this drug. Your nurse will help you get started.  . Two negative pregnancy tests are  required in women of childbearing potential prior to starting treatment. Routine pregnancy tests are required during treatment.  . Do  not donate blood during your treatment and for 4 weeks after your treatment.  . Men should not donate sperm during your treatment and for 4 weeks after your treatment because this drug is present in semen and may badly harm a baby.  How to Take Your Medication . Swallow the medicine whole with water, with or without food. Do not chew, break, or open it.  . Take this medicine at about the same time each day  . Missed dose: If you miss a dose, take it as soon as you think about it ONLY if it has been less than 12 hours since you normally take the missed dose. If it has been more than 12 hours, skip the missed dose and contact your physician. Take your next dose at the regular time. Do not take 2 doses at the same time and do not double up on the next dose.  . If you vomit a dose, take your next dose at the regular time.  . Handling: Wash your hands after handling your medicine, your caretakers should not handle your medicine with bare hands and should wear latex gloves.  . If you get any of the content of a broken capsules on your skin, you should wash the area of the skin well with soap and water right away. Call your doctor if you get a skin reaction.  . This drug may be present in the saliva, tears, sweat, urine, stool, vomit, semen, and vaginal secretions. Talk to your doctor and/or your nurse about the necessary precautions to take during this time.  . Storage: Store this medicine in the original container at room temperature.  . Disposal of unused medicine: Do not flush any expired and/or unused medicine down the toilet or drain unless you are specifically instructed to do so on the medication label. Some facilities have take-back programs and/or other options. If you do not have a take-back program in your area, then please discuss with your nurse or your doctor how to dispose of unused medicine.  Treating Side Effects . Manage tiredness by pacing your activities for the day.  . Be  sure to include periods of rest between energy-draining activities.  . If you are dizzy, get up slowly after sitting or lying.  . To decrease the risk of infection, wash your hands regularly.  . Avoid close contact with people who have a cold, the flu, or other infections.  . Take your temperature as your doctor or nurse tells you, and whenever you feel like you may have a fever.  . To help decrease the risk of bleeding, use a soft toothbrush. Check with your nurse before using dental floss.  . Be very careful when using knives or tools.  . Use an electric shaver instead of a razor.  . Ask your doctor or nurse about medicines that are available to help stop or lessen constipation and/or diarrhea.  . If you are not able to move your bowels, check with your doctor or nurse before you use enemas, laxatives, or suppositories.  . Drink plenty of fluids (a minimum of eight glasses per day is recommended).  . Drink fluids that contribute calories (whole milk, juice, soft drinks, sweetened beverages, milkshakes, and nutritional supplements) instead of water.  . If you throw up or have loose bowel movements, you should drink more fluids so that you do  not become dehydrated (lack of water in the body from losing too much fluid).  . If you have diarrhea, eat low-fiber foods that are high in protein and calories and avoid foods that can irritate your digestive tracts or lead to cramping.  . To help with nausea and vomiting, eat small, frequent meals instead of three large meals a day. Choose foods and drinks that are at room temperature. Ask your nurse or doctor about other helpful tips and medicine that is available to help stop or lessen these symptoms.  . To help with decreased appetite, eat small, frequent meals. Eat foods high in calories and protein, such as meat, poultry, fish, dry beans, tofu, eggs, nuts, milk, yogurt, cheese, ice cream, pudding, and nutritional supplements.  . Consider  using sauces and spices to increase taste. Daily exercise, with your doctor's approval, may increase your appetite.  . If you get a rash, do not put anything on it unless your doctor or nurse says you may. Keep the area around the rash clean and dry. Ask your doctor for medicine if your rash bothers you.  Marland Kitchen Keeping your pain under control is important to your well-being. Please tell your doctor or nurse if you are experiencing pain.  . If you are having trouble sleeping, talk to your nurse or doctor on tips to help you sleep better.  . If you have a nosebleed, sit with your head tipped slightly forward. Apply pressure by lightly pinching the bridge of your nose between your thumb and forefinger. Call your doctor if you feel dizzy or faint or if the bleeding does not stop after 10 to 15 minutes.  . Moisturize your skin several times a day.  . Avoid sun exposure and apply sunscreen routinely when outdoors.  Food and Drug Interactions  . There are no known interactions of lenalidomide with food.  . Check with your doctor or pharmacist about all other prescription medicines and over-the-counter medicines and dietary supplements (vitamins, minerals, herbs, and others) you are taking before starting this medicine as there are known drug interactions with lenalidomide. Also, check with your doctor or pharmacist before starting any new prescription or over-the-counter medicines, or dietary supplements to make sure that there are no interactions.  . There are known interactions of lenalidomide with blood-thinning medicine such as warfarin. Ask your doctor what precautions you should take.  When to Call the Doctor Call your doctor or nurse if you have any of these symptoms and/or any new or unusual symptoms:  . Fever of 100.4 F (38 C) or higher  . Chills  . Tiredness that interferes with your daily activities  . Feeling dizzy or lightheaded  . Easy bleeding or bruising  . Your leg or arm  is swollen, red, warm, and/or painful  . Headache that does not go away  . Nosebleed that does not stop bleeding after 10-15 minutes  . Painful lymph nodes  . Wheezing and/or trouble breathing  . Chest pain or symptoms of a heart attack. Most heart attacks involve pain in the center of the chest that lasts more than a few minutes. The pain may go away and come back. It can feel like pressure, squeezing, fullness, or pain. Sometimes pain is felt in one or both arms, the back, neck, jaw, or stomach. If any of these symptoms last 2 minutes, call 911.  Marland Kitchen Symptoms of a stroke such as sudden numbness or weakness of your face, arm, or leg, mostly on one side  of your body; sudden confusion, trouble speaking or understanding; sudden trouble seeing in one or both eyes; sudden trouble walking, feeling dizzy, loss of balance or coordination; or sudden, bad headache with no known cause. If you have any of these symptoms for 2 minutes, call 911.  . Signs of allergic reaction: swelling of the face, feeling like your tongue or throat are swelling, trouble breathing, rash, itching, fever, chills, feeling dizzy, and/or feeling that your heart is beating in a fast or not normal way. If this happens, call 911 for emergency care.  . Coughing up yellow, green, or bloody mucus  . Feeling that your heart is beating in a fast or not normal way (palpitations)  . Nausea that stops you from eating or drinking and/or is not relieved by prescribed medicines  . Throwing up  . Loose bowel movements (diarrhea) 4 times a day or loose bowel movements with lack of strength or a feeling of being dizzy  . No bowel movement in 3 days or when you feel uncomfortable  . Trouble falling or staying asleep  . Pain in your abdomen that does not go away  . Weight gain of 5 pounds in one week (fluid retention)  . Swelling of your legs, ankles and/or feet  . Unexplained weight gain  . Lasting loss of appetite or rapid weight  loss of five pounds in a week  . Pain that does not go away, or is not relieved by prescribed medicines  . Flu-like symptoms: fever, headache, muscle and joint aches, and fatigue (low energy, feeling weak)  . A new rash or itching that is not relieved by prescribed medicines  . Signs of possible liver problems: dark urine, pale bowel movements, bad stomach pain, feeling very tired and weak, unusual itching, or yellowing of the eyes or skin  . Signs of tumor lysis: confusion or agitation, decreased urine, nausea/vomiting, diarrhea, muscle cramping, numbness and/or tingling, seizures  . If you think you may be pregnant or may have impregnated your partner  Reproduction Warnings  . Pregnancy warning: This drug can have harmful effects on the unborn baby. Women of childbearing potential must commit to abstain from heterosexual intercourse or use 2 effective methods of birth control, one of which, must be a highly effective method of birth control, beginning at least 4 weeks before treatment starts, during your cancer treatment, including dose interruptions, and for at least 4 weeks after treatment. A highly effective method of birth control includes tubal ligation, intrauterine device (IUD), hormonal (birth control pills, injections, patch and/or implants) or a partner's vasectomy. Stop taking lenalidomide immediately and let your doctor know right away if you think you may be pregnant, miss your menstrual period, or experience unusual menstrual bleeding.  . Men with female partners of childbearing potential should use effective methods of birth control during your cancer treatment and for at least 4 weeks after your cancer treatment. You should always wear a condom even if you have undergone a successful vasectomy. Let your doctor know right away if you think you may have impregnated your partner.  . Breastfeeding warning: Women should not breastfeed during treatment because this drug could enter  the breast milk and cause harm to a breastfeeding baby.  . Fertility warning: Human fertility studies have not been done with this drug. Talk with your doctor or nurse if you plan to have children. Ask for information on sperm or egg banking.    Dexamethasone (Decadron)  About This Drug  Dexamethasone  is used to treat cancer, to decrease inflammation and sometimes used before and after chemotherapy to prevent or treat nausea and/or vomiting. It is given in the vein (IV) or orally (by mouth).  Possible Side Effects  . Headache  . High blood pressure  . Abnormal heart beat  . Tiredness and weakness  . Changes in mood, which may include depression or a feeling of extreme well-being  . Trouble sleeping  . Increased sweating  . Increased appetite (increased hunger)  . Weight gain  . Increase risk of infections  . Pain in your abdomen  . Nausea  . Skin changes such as rash, dryness, redness  . Blood sugar levels may change  . Electrolyte changes  . Swelling of your legs, ankles and/or feet  . Changes in your liver function  . You may be at risk for cataracts, glaucoma or infections of the eye  . Muscle loss and / or weakness (lack of muscle strength)  . Increased risk of developing osteoporosis- your bones may become weak and brittle  Note: Not all possible side effects are included above.  Warnings and Precautions  . This drug may cause you to feel irritable, nervous or restless.  . Allergic reactions, including anaphylaxis are rare but may happen in some patients. Signs of allergic reaction to this drug may be swelling of the face, feeling like your tongue or throat are swelling, trouble breathing, rash, itching, fever, chills, feeling dizzy, and/or feeling that your heart is beating in a fast or not normal way. If this happens, do not take another dose of this drug. You should get urgent medical treatment.  . High blood pressure and changes in electrolytes,  which can cause fluid build-up around your heart, lungs or elsewhere.  . Increased risk of developing a hole in your stomach, small, and/or large intestine if you have ulcers in the lining of your stomach and/or intestine, or have diverticulitis, ulcerative colitis and/or other diseases that affect the gastrointestinal tract.  . Effects on the endocrine glands including the pituitary, adrenals or thyroid during or after use of this medication.  . Changes in the tissue of the heart, that can cause your heart to have less ability to pump blood. You may be short of breath or our arms, hands, legs and feet may swell.  . Increased risk of heart attack.  . Severe depression and other psychiatric disorders such as mood changes.  . Burning, pain and itching around your anus may happen when this drug is given in the vein too rapidly (IV). It usually happens suddenly and resolves in less than 1 minute.  Important Information  . Talk to your doctor or your nurse before stopping this medication, it should be stopped gradually. Depending on the dose and length of treatment, you could experience serious side effects if stopped abruptly (suddenly).  . Talk to your doctor before receiving any vaccinations during your treatment. Some vaccinations are not recommended while receiving dexamethasone.  How to Take Your Medication  . For Oral (by mouth): You can take the medicine with or without food. If you have nausea or upset stomach, take it with food.  . Missed dose: If you miss a dose, do not take 2 doses at the same time or extra doses. . If you vomit a dose, take your next dose at the regular time. Do not take 2 doses at the same time  . Handling: Wash your hands after handling your medicine, your caretakers should not  handle your medicine with bare hands and should wear latex gloves.  . Storage: Store this medicine in the original container at room temperature. Protect from moisture and light. Discuss  with your nurse or your doctor how to dispose of unused medicine.  Treating Side Effects  . Drink plenty of fluids (a minimum of eight glasses per day is recommended).  . To help with nausea and vomiting, eat small, frequent meals instead of three large meals a day. Choose foods and drinks that are at room temperature. Ask your nurse or doctor about other helpful tips and medicine that is available to help stop or lessen these symptoms.  . If you throw up, you should drink more fluids so that you do not become dehydrated (lack of water in the body from losing too much fluid).  . Manage tiredness by pacing your activities for the day.  . Be sure to include periods of rest between energy-draining activities.  . To help with muscle weakness, get regular exercise. If you feel too tired to exercise vigorously, try taking a short walk.  . If you are having trouble sleeping, talk to your nurse or doctor on tips to help you sleep better.  . If you are feeling depressed, talk to your nurse or doctor about it.  Marland Kitchen Keeping your pain under control is important to your well-being. Please tell your doctor or nurse if you are experiencing pain.  . If you have diabetes, keep good control of your blood sugar level. Tell your nurse or your doctor if your glucose levels are higher or lower than normal.  . To decrease the risk of infection, wash your hands regularly.  . Avoid close contact with people who have a cold, the flu, or other infections.  . Take your temperature as your doctor or nurse tells you, and whenever you feel like you may have a fever.  . If you get a rash do not put anything on it unless your doctor or nurse says you may. Keep the area around the rash clean and dry. Ask your doctor for medicine if your rash bothers you.  . Moisturize your skin several times day.  . Avoid sun exposure and apply sunscreen routinely when outdoors.  Food and Drug Interactions  . There are no known  interactions of dexamethasone with food.  . Check with your doctor or pharmacist about all other prescription medicines and over-the-counter medicines and dietary supplements (vitamins, minerals, herbs and others) you are taking before starting this medicine as there are known drug interactions with dexamethasone. Also, check with your doctor or pharmacist before starting any new prescription or over-the-counter medicines, or dietary supplement to make sure that there are no interactions.  . There are known interactions of dexamethasone with other medicines and products like acetaminophen, aspirin, and ibuprofen. Ask your doctor what over-the-counter (OTC) medicines you can take.  When to Call the Doctor  Call your doctor or nurse if you have any of these symptoms and/or any new or unusual symptoms:  . Fever of 100.4 F (38 C) or higher  . Chills  . A headache that does not go away  . Trouble breathing  . Blurry vision or other changes in eyesight  . Feel irritable, nervous or restless  . Trouble falling or staying asleep  . Severe mood changes such as depression or unusual thoughts and/or behaviors  . Thoughts of hurting yourself or others, and suicide  . Tiredness that interferes with your daily activities  .  Feeling that your heart is beating in a fast, slow or not normal way  . Feeling dizzy or lightheaded  . Chest pain or symptoms of a heart attack. Most heart attacks involve pain in the center of the chest that lasts more than a few minutes. The pain may go away and come back, or it can be constant. It can feel like pressure, squeezing, fullness, or pain. Sometimes pain is felt in one or both arms, the back, neck, jaw, or stomach. If any of these symptoms last 2 minutes, call 911.   Marland Kitchen Heartburn or indigestion  . Nausea that stops you from eating or drinking and/or is not relieved by prescribed medicines  . Throwing up  . Pain in your abdomen that does not go away  .  Abnormal blood sugar  . Unusual thirst, passing urine often, headache, sweating, shakiness, irritability  . Swelling of legs, ankles, or feet  . Weight gain of 5 pounds in one week (fluid retention)  . Signs of possible liver problems: dark urine, pale bowel movements, bad stomach pain, feeling very tired and weak, unusual itching, or yellowing of the eyes or skin  . Severe muscle weakness  . A new rash or a rash that is not relieved by prescribed medicines  . Signs of allergic reaction: swelling of the face, feeling like your tongue or throat are swelling, trouble breathing, rash, itching, fever, chills, feeling dizzy, and/or feeling that your heart is beating in a fast or not normal way. If this happens, call 911 for emergency care.  . If you think you may be pregnant  Reproduction Warnings  . Pregnancy warning: It is not known if this drug may harm an unborn child. For this reason, be sure to talk with your doctor if you are pregnant or planning to become pregnant while receiving this drug. Let your doctor know right away if you think you may be pregnant or may have impregnated your partner.  . Breastfeeding warning: It is not known if this drug passes into breast milk. For this reason, women should talk to their doctor about the risks and benefits of breastfeeding during treatment with this drug because this drug may enter the breast milk and cause harm to a breastfeeding baby.  . Fertility warning: Human fertility studies have not been done with this drug. Talk with your doctor or nurse if you plan to have children. Ask for information on sperm banking.   SELF CARE ACTIVITIES WHILE RECEIVING CHEMOTHERAPY:  Hydration Increase your fluid intake 48 hours prior to treatment and drink at least 8 to 12 cups (64 ounces) of water/decaffeinated beverages per day after treatment. You can still have your cup of coffee or soda but these beverages do not count as part of your 8 to 12 cups that  you need to drink daily. No alcohol intake.  Medications Continue taking your normal prescription medication as prescribed.  If you start any new herbal or new supplements please let us know first to make sure it is safe.  Mouth Care Have teeth cleaned professionally before starting treatment. Keep dentures and partial plates clean. Use soft toothbrush and do not use mouthwashes that contain alcohol. Biotene is a good mouthwash that is available at most pharmacies or may be ordered by calling [phone]. Use warm salt water gargles (1 teaspoon salt per 1 quart warm water) before and after meals and at bedtime. If you need dental work, please let the doctor know before you go  for your appointment so that we can coordinate the best possible time for you in regards to your chemo regimen. You need to also let your dentist know that you are actively taking chemo. We may need to do labs prior to your dental appointment.  Skin Care Always use sunscreen that has not expired and with SPF (Sun Protection Factor) of 50 or higher. Wear hats to protect your head from the sun. Remember to use sunscreen on your hands, ears, face, & feet.  Use good moisturizing lotions such as udder cream, eucerin, or even Vaseline. Some chemotherapies can cause dry skin, color changes in your skin and nails.    . Avoid long, hot showers or baths. . Use gentle, fragrance-free soaps and laundry detergent. . Use moisturizers, preferably creams or ointments rather than lotions because the thicker consistency is better at preventing skin dehydration. Apply the cream or ointment within 15 minutes of showering. Reapply moisturizer at night, and moisturize your hands every time after you wash them.  Hair Loss (if your doctor says your hair will fall out)  . If your doctor says that your hair is likely to fall out, decide before you begin chemo whether you want to wear a wig. You may want to shop before treatment to match your hair  color. . Hats, turbans, and scarves can also camouflage hair loss, although some people prefer to leave their heads uncovered. If you go bare-headed outdoors, be sure to use sunscreen on your scalp. . Cut your hair short. It eases the inconvenience of shedding lots of hair, but it also can reduce the emotional impact of watching your hair fall out. . Don't perm or color your hair during chemotherapy. Those chemical treatments are already damaging to hair and can enhance hair loss. Once your chemo treatments are done and your hair has grown back, it's OK to resume dyeing or perming hair.  With chemotherapy, hair loss is almost always temporary. But when it grows back, it may be a different color or texture. In older adults who still had hair color before chemotherapy, the new growth may be completely gray.  Often, new hair is very fine and soft.  Infection Prevention Please wash your hands for at least 30 seconds using warm soapy water. Handwashing is the #1 way to prevent the spread of germs. Stay away from sick people or people who are getting over a cold. If you develop respiratory systems such as green/yellow mucus production or productive cough or persistent cough let us know and we will see if you need an antibiotic. It is a good idea to keep a pair of gloves on when going into grocery stores/[employer] to decrease your risk of coming into contact with germs on the carts, etc. Carry alcohol hand gel with you at all times and use it frequently if out in public. If your temperature reaches 100.5 or higher please call the clinic and let us know.  If it is after hours or on the weekend please go to the ER if your temperature is over 100.5.  Please have your own personal thermometer at home to use.    Sex and bodily fluids If you are going to have sex, a condom must be used to protect the person that isn't taking chemotherapy. Chemo can decrease your libido (sex drive). For a few days after chemotherapy,  chemotherapy can be excreted through your bodily fluids.  When using the toilet please close the lid and flush the toilet twice.  Do this for a few day after you have had chemotherapy.   Effects of chemotherapy on your sex life Some changes are simple and won't last long. They won't affect your sex life permanently.  Sometimes you may feel: . too tired . not strong enough to be very active . sick or sore  . not in the mood . anxious or low Your anxiety might not seem related to sex. For example, you may be worried about the cancer and how your treatment is going. Or you may be worried about money, or about how you family are coping with your illness.  These things can cause stress, which can affect your interest in sex. It's important to talk to your partner about how you feel.  Remember - the changes to your sex life don't usually last long. There's usually no medical reason to stop having sex during chemo. The drugs won't have any long term physical effects on your performance or enjoyment of sex. Cancer can't be passed on to your partner during sex  Contraception It's important to use reliable contraception during treatment. Avoid getting pregnant while you or your partner are having chemotherapy. This is because the drugs may harm the baby. Sometimes chemotherapy drugs can leave a man or woman infertile.  This means you would not be able to have children in the future. You might want to talk to someone about permanent infertility. It can be very difficult to learn that you may no longer be able to have children. Some people find counselling helpful. There might be ways to preserve your fertility, although this is easier for men than for women. You may want to speak to a fertility expert. You can talk about sperm banking or harvesting your eggs. You can also ask about other fertility options, such as donor eggs. If you have or have had breast cancer, your doctor might advise you not to take the  contraceptive pill. This is because the hormones in it might affect the cancer. It is not known for sure whether or not chemotherapy drugs can be passed on through semen or secretions from the vagina. Because of this some doctors advise people to use a barrier method if you have sex during treatment. This applies to vaginal, anal or oral sex. Generally, doctors advise a barrier method only for the time you are actually having the treatment and for about a week after your treatment. Advice like this can be worrying, but this does not mean that you have to avoid being intimate with your partner. You can still have close contact with your partner and continue to enjoy sex.  Animals If you have cats or birds we just ask that you not change the litter or change the cage.  Please have someone else do this for you while you are on chemotherapy.   Food Safety During and After Cancer Treatment Food safety is important for people both during and after cancer treatment. Cancer and cancer treatments, such as chemotherapy, radiation therapy, and stem cell/bone marrow transplantation, often weaken the immune system. This makes it harder for your body to protect itself from foodborne illness, also called food poisoning. Foodborne illness is caused by eating food that contains harmful bacteria, parasites, or viruses.  Foods to avoid Some foods have a higher risk of becoming tainted with bacteria. These include: Marland Kitchen Unwashed fresh fruit and vegetables, especially leafy vegetables that can hide dirt and other contaminants . Raw sprouts, such as alfalfa sprouts . Raw or  undercooked beef, especially ground beef, or other raw or undercooked meat and poultry . Fatty, fried, or spicy foods immediately before or after treatment.  These can sit heavy on your stomach and make you feel nauseous. . Raw or undercooked shellfish, such as oysters. . Sushi and sashimi, which often contain raw fish.  . Unpasteurized beverages, such as  unpasteurized fruit juices, raw milk, raw yogurt, or cider . Undercooked eggs, such as soft boiled, over easy, and poached; raw, unpasteurized eggs; or foods made with raw egg, such as homemade raw cookie dough and homemade mayonnaise  Simple steps for food safety  Shop smart. . Do not buy food stored or displayed in an unclean area. . Do not buy bruised or damaged fruits or vegetables. . Do not buy cans that have cracks, dents, or bulges. . Pick up foods that can spoil at the end of your shopping trip and store them in a cooler on the way home.  Prepare and clean up foods carefully. . Rinse all fresh fruits and vegetables under running water, and dry them with a clean towel or paper towel. . Clean the top of cans before opening them. . After preparing food, wash your hands for 20 seconds with hot water and soap. Pay special attention to areas between fingers and under nails. . Clean your utensils and dishes with hot water and soap. Marland Kitchen Disinfect your kitchen and cutting boards using 1 teaspoon of liquid, unscented bleach mixed into 1 quart of water.    Dispose of old food. . Eat canned and packaged food before its expiration date (the "use by" or "best before" date). . Consume refrigerated leftovers within 3 to 4 days. After that time, throw out the food. Even if the food does not smell or look spoiled, it still may be unsafe. Some bacteria, such as Listeria, can grow even on foods stored in the refrigerator if they are kept for too long.  Take precautions when eating out. . At restaurants, avoid buffets and salad bars where food sits out for a long time and comes in contact with many people. Food can become contaminated when someone with a virus, often a norovirus, or another "bug" handles it. . Put any leftover food in a "to-go" container yourself, rather than having the server do it. And, refrigerate leftovers as soon as you get home. . Choose restaurants that are clean and that are  willing to prepare your food as you order it cooked.   AT HOME MEDICATIONS:                                                                                                                                                                Compazine/Prochlorperazine 86m tablet. Take 1 tablet every 6 hours as needed  for nausea/vomiting. (This can make you sleepy)   EMLA cream. Apply a quarter size amount to port site 1 hour prior to chemo. Do not rub in. Cover with plastic wrap.    Diarrhea Sheet   If you are having loose stools/diarrhea, please purchase Imodium and begin taking as outlined:  At the first sign of poorly formed or loose stools you should begin taking Imodium (loperamide) 2 mg capsules.  Take two tablets (73m) followed by one tablet (266m every 2 hours - DO NOT EXCEED 8 tablets in 24 hours.  If it is bedtime and you are having loose stools, take 2 tablets at bedtime, then 2 tablets every 4 hours until morning.   Always call the [facility] you are having loose stools/diarrhea that you can't get under control.  Loose stools/diarrhea leads to dehydration (loss of water) in your body.  We have other options of trying to get the loose stools/diarrhea to stop but you must let us[place]ow!   Constipation Sheet  Colace - 100 mg capsules - take 2 capsules daily.  If this doesn't help then you can increase to 2 capsules twice daily.  Please call if the above does not work for you. Do not go more than 2 days without a bowel movement.  It is very important that you do not become constipated.  It will make you feel sick to your stomach (nausea) and can cause abdominal pain and vomiting.  Nausea Sheet   Compazine/Prochlorperazine 1032mablet. Take 1 tablet every 6 hours as needed for nausea/vomiting (This can make you drowsy).  If you are having persistent nausea (nausea that does not stop) please call the [facility] let us [place] the amount of nausea that you are experiencing.  If you  begin to vomit, you need to call the [facility] if it is the weekend and you have vomited more than one time and can't get it to stop-go to the Emergency Room.  Persistent nausea/vomiting can lead to dehydration (loss of fluid in your body) and will make you feel very weak and unwell. Ice chips, sips of clear liquids, foods that are at room temperature, crackers, and toast tend to be better tolerated.   SYMPTOMS TO REPORT AS SOON AS POSSIBLE AFTER TREATMENT:  FEVER GREATER THAN 100.4 F  CHILLS WITH OR WITHOUT FEVER  NAUSEA AND VOMITING THAT IS NOT CONTROLLED WITH YOUR NAUSEA MEDICATION  UNUSUAL SHORTNESS OF BREATH  UNUSUAL BRUISING OR BLEEDING  TENDERNESS IN MOUTH AND THROAT WITH OR WITHOUT   PRESENCE OF ULCERS  URINARY PROBLEMS  BOWEL PROBLEMS  UNUSUAL RASH      Wear comfortable clothing and clothing appropriate for easy access to any Portacath or PICC line. Let us [place] if there is anything that we can do to make your therapy better!    What to do if you need assistance after hours or on the weekends: CALL [phone] on the line, do not hang up.  You will hear multiple messages but at the end you will be connected with a nurse triage line.  They will contact the doctor if necessary.  Most of the time they will be able to assist you.  Do not call the hospital operator.      I have been informed and understand all of the instructions given to me and have received a copy. I have been instructed to call the clinic ([phone] my family physician as soon as possible  for continued medical care, if indicated. I do not have any more questions at this time but understand that I may call the [facility] or the Patient Navigator at [phone] during office hours should I have questions or need assistance in obtaining follow-up care.

## 2019-10-26 NOTE — Progress Notes (Signed)
Patient's Revlimid to be delivered tomorrow per [employer]. Patient's daughter aware and verbalized understanding of medication regimen.

## 2019-10-26 NOTE — Progress Notes (Signed)
I have spoken with [facility] today regarding patient's Revlimid prescription. Diagnosis code provided to the pharmacy. Pharmacy is awaiting grant application and states they will call me back today with an ETA for the medication to be delivered to the patient.

## 2019-10-29 ENCOUNTER — Inpatient Hospital Stay (HOSPITAL_COMMUNITY): Payer: Medicare Other

## 2019-10-29 ENCOUNTER — Inpatient Hospital Stay (HOSPITAL_BASED_OUTPATIENT_CLINIC_OR_DEPARTMENT_OTHER): Payer: Medicare Other | Admitting: Hematology

## 2019-10-29 ENCOUNTER — Other Ambulatory Visit: Payer: Self-pay

## 2019-10-29 VITALS — BP 131/47 | HR 74 | Temp 96.8°F | Resp 18 | Wt 176.7 lb

## 2019-10-29 DIAGNOSIS — C9000 Multiple myeloma not having achieved remission: Secondary | ICD-10-CM | POA: Diagnosis not present

## 2019-10-29 DIAGNOSIS — Z5112 Encounter for antineoplastic immunotherapy: Secondary | ICD-10-CM | POA: Diagnosis not present

## 2019-10-29 DIAGNOSIS — D649 Anemia, unspecified: Secondary | ICD-10-CM

## 2019-10-29 DIAGNOSIS — I10 Essential (primary) hypertension: Secondary | ICD-10-CM | POA: Diagnosis not present

## 2019-10-29 DIAGNOSIS — E119 Type 2 diabetes mellitus without complications: Secondary | ICD-10-CM | POA: Diagnosis not present

## 2019-10-29 DIAGNOSIS — M25551 Pain in right hip: Secondary | ICD-10-CM | POA: Diagnosis not present

## 2019-10-29 LAB — CBC WITH DIFFERENTIAL/PLATELET
Abs Immature Granulocytes: 0.25 10*3/uL — ABNORMAL HIGH (ref 0.00–0.07)
Basophils Absolute: 0 10*3/uL (ref 0.0–0.1)
Basophils Relative: 0 %
Eosinophils Absolute: 0.4 10*3/uL (ref 0.0–0.5)
Eosinophils Relative: 4 %
HCT: 24.3 % — ABNORMAL LOW (ref 36.0–46.0)
Hemoglobin: 7.2 g/dL — ABNORMAL LOW (ref 12.0–15.0)
Immature Granulocytes: 2 %
Lymphocytes Relative: 45 %
Lymphs Abs: 4.8 10*3/uL — ABNORMAL HIGH (ref 0.7–4.0)
MCH: 26.6 pg (ref 26.0–34.0)
MCHC: 29.6 g/dL — ABNORMAL LOW (ref 30.0–36.0)
MCV: 89.7 fL (ref 80.0–100.0)
Monocytes Absolute: 0.7 10*3/uL (ref 0.1–1.0)
Monocytes Relative: 6 %
Neutro Abs: 4.6 10*3/uL (ref 1.7–7.7)
Neutrophils Relative %: 43 %
Platelets: 244 10*3/uL (ref 150–400)
RBC: 2.71 MIL/uL — ABNORMAL LOW (ref 3.87–5.11)
RDW: 20.8 % — ABNORMAL HIGH (ref 11.5–15.5)
WBC: 10.7 10*3/uL — ABNORMAL HIGH (ref 4.0–10.5)
nRBC: 2.3 % — ABNORMAL HIGH (ref 0.0–0.2)

## 2019-10-29 LAB — COMPREHENSIVE METABOLIC PANEL
ALT: 14 U/L (ref 0–44)
AST: 20 U/L (ref 15–41)
Albumin: 3.4 g/dL — ABNORMAL LOW (ref 3.5–5.0)
Alkaline Phosphatase: 63 U/L (ref 38–126)
Anion gap: 7 (ref 5–15)
BUN: 17 mg/dL (ref 8–23)
CO2: 26 mmol/L (ref 22–32)
Calcium: 9.3 mg/dL (ref 8.9–10.3)
Chloride: 103 mmol/L (ref 98–111)
Creatinine, Ser: 0.95 mg/dL (ref 0.44–1.00)
GFR calc Af Amer: >60 mL/min (ref >60)
GFR calc non Af Amer: 58 mL/min — ABNORMAL LOW (ref >60)
Glucose, Bld: 159 mg/dL — ABNORMAL HIGH (ref 70–99)
Potassium: 4.1 mmol/L (ref 3.5–5.1)
Sodium: 136 mmol/L (ref 135–145)
Total Bilirubin: 0.8 mg/dL (ref 0.3–1.2)
Total Protein: 8.4 g/dL — ABNORMAL HIGH (ref 6.5–8.1)

## 2019-10-29 LAB — LACTATE DEHYDROGENASE: LDH: 134 U/L (ref 98–192)

## 2019-10-29 MED ORDER — BORTEZOMIB CHEMO SQ INJECTION 3.5 MG (2.5MG/ML)
1.3000 mg/m2 | Freq: Once | INTRAMUSCULAR | Status: AC
  Administered 2019-10-29: 2.25 mg via SUBCUTANEOUS
  Filled 2019-10-29: qty 0.9

## 2019-10-29 MED ORDER — DEXAMETHASONE 4 MG PO TABS
20.0000 mg | ORAL_TABLET | Freq: Once | ORAL | Status: AC
  Administered 2019-10-29: 20 mg via ORAL
  Filled 2019-10-29: qty 5

## 2019-10-29 NOTE — Patient Instructions (Signed)
[facility] at [facility] Discharge Instructions  You were seen today by Dr. Katragadda. He went over your recent results. You received your treatment today; continue receiving your weekly treatments. Dr. Katragadda will see you back in 2 weeks for labs and follow up.   Thank you for choosing [facility] at [facility] to provide your oncology and hematology care.  To afford each patient quality time with our provider, please arrive at least 15 minutes before your scheduled appointment time.   If you have a lab appointment with the Cancer Center please come in thru the Main Entrance and check in at the main information desk  You need to re-schedule your appointment should you arrive 10 or more minutes late.  We strive to give you quality time with our providers, and arriving late affects you and other patients whose appointments are after yours.  Also, if you no show three or more times for appointments you may be dismissed from the clinic at the providers discretion.     Again, thank you for choosing [facility].  Our hope is that these requests will decrease the amount of time that you wait before being seen by our physicians.       _____________________________________________________________  Should you have questions after your visit to  Cancer Center, please contact our office at [phone] between the hours of 8:00 a.m. and 4:30 p.m.  Voicemails left after 4:00 p.m. will not be returned until the following business day.  For prescription refill requests, have your pharmacy contact our office and allow 72 hours.    Cancer Center Support Programs:   > Cancer Support Group  2nd Tuesday of the month 1pm-2pm, Journey Room    

## 2019-10-29 NOTE — Progress Notes (Signed)
Patient tolerated Velcade injection with no complaints voiced.  Lab work reviewed.  See MAR for details.  Injection site clean and dry with no bruising or swelling noted.  Band aid applied.  VSS.  Patient left in satisfactory condition with no s/s of distress noted.  

## 2019-10-29 NOTE — Progress Notes (Signed)
[facility] [address], [place] [address]   CLINIC:  Medical Oncology/Hematology  PCP:  Kim Rossetti, MD [address] [place] [place] [address] [phone]   REASON FOR VISIT:  Follow-up for normocytic anemia and multiple myeloma  PRIOR THERAPY: None  NGS Results: Not done  CURRENT THERAPY: Bortezomib weekly  BRIEF ONCOLOGIC HISTORY:  Oncology History  Multiple myeloma not having achieved remission ([place])  10/13/2019 Initial Diagnosis   Multiple myeloma not having achieved remission ([place])   10/29/2019 -  Chemotherapy   The patient had dexamethasone (DECADRON) tablet 20 mg, 20 mg (original dose ), Oral,  Once, 0 of 8 cycles Dose modification: 20 mg (Cycle 1) bortezomib SQ (VELCADE) chemo injection 2.25 mg, 1.3 mg/m2, Subcutaneous,  Once, 0 of 8 cycles  for chemotherapy treatment.      CANCER STAGING: Cancer Staging No matching staging information was found for the patient.  INTERVAL HISTORY:  [name], a 79 y.o. female, returns for routine follow-up and consideration for first cycle of chemotherapy. Kim Mills was last seen on 10/11/2019.  Due for initiating cycle #1 of bortezomib today.   Today she is accompanied by her daughter. She has received the Revlimid and took it this morning; she did not take Decadron today since she reports she will receive it here.     She received 1 dose of Shingrix right before COVID started.  Overall, she feels ready for first cycle of chemo today.    REVIEW OF SYSTEMS:  Review of Systems  Constitutional: Positive for appetite change (mildly decreased) and fatigue (moderate).  HENT:   Positive for trouble swallowing (occasional).   Respiratory: Positive for cough.   Gastrointestinal: Positive for constipation and vomiting.  Musculoskeletal: Positive for arthralgias (8/10 knee and legs pain).  Neurological: Positive for dizziness and numbness (feet).  Psychiatric/Behavioral: Positive for  sleep disturbance (d/t pain).  All other systems reviewed and are negative.   PAST MEDICAL/SURGICAL HISTORY:  Past Medical History:  Diagnosis Date  . Arthritis   . Cataract   . Chest pain    Associated with weakness and fatigue  . Diabetes mellitus    A1c of 7.4 in 08/2010  . GERD (gastroesophageal reflux disease)   . Glaucoma   . Hyperlipidemia    Lipid profile in 08/2010:190, 121, 48, 118; normal CBC and CMet  . Hypertension    Lipid profile in 08/2010:190, 121, 48, 118; normal CBC and CMet  . Hypothyroidism   . Hypothyroidism   . Obesity    Past Surgical History:  Procedure Laterality Date  . CHOLECYSTECTOMY    . COLONOSCOPY N/A 04/01/2014   RMR: Melanosis coli. colonic polyps removed as described above.   . CYSTOSCOPY W/ URETERAL STENT PLACEMENT Right 09/13/2019   Procedure: CYSTOSCOPY WITH RIGHT  RETROGRADE PYELOGRAM; BLADDER BIOPSY;  Surgeon: Kim Gustin, MD;  Location: [facility];  Service: Urology;  Laterality: Right;  . LEFT HEART CATH AND CORONARY ANGIOGRAPHY N/A 10/07/2017   Procedure: LEFT HEART CATH AND CORONARY ANGIOGRAPHY;  Surgeon: Kim Sine, MD;  Location: [facility];  Service: Cardiovascular;  Laterality: N/A;  . THYROIDECTOMY, PARTIAL      SOCIAL HISTORY:  Social History   Socioeconomic History  . Marital status: Widowed    Spouse name: Not on file  . Number of children: 7  . Years of education: Not on file  . Highest education level: Not on file  Occupational History  Employer: RETIRED  Tobacco Use  . Smoking status: Passive Smoke Exposure - Never Smoker  . Smokeless tobacco: Never Used  Vaping Use  . Vaping Use: Never used  Substance and Sexual Activity  . Alcohol use: Never  . Drug use: Never  . Sexual activity: Not Currently  Other Topics Concern  . Not on file  Social History Narrative  . Not on file   Social Determinants of Health   Financial Resource Strain:   . Difficulty of Paying Living Expenses: Not on file    Food Insecurity:   . Worried About Charity fundraiser in the Last Year: Not on file  . Ran Out of Food in the Last Year: Not on file  Transportation Needs:   . Lack of Transportation (Medical): Not on file  . Lack of Transportation (Non-Medical): Not on file  Physical Activity:   . Days of Exercise per Week: Not on file  . Minutes of Exercise per Session: Not on file  Stress:   . Feeling of Stress : Not on file  Social Connections:   . Frequency of Communication with Friends and Family: Not on file  . Frequency of Social Gatherings with Friends and Family: Not on file  . Attends Religious Services: Not on file  . Active Member of Clubs or Organizations: Not on file  . Attends Archivist Meetings: Not on file  . Marital Status: Not on file  Intimate Partner Violence:   . Fear of Current or Ex-Partner: Not on file  . Emotionally Abused: Not on file  . Physically Abused: Not on file  . Sexually Abused: Not on file    FAMILY HISTORY:  Family History  Problem Relation Age of Onset  . Anemia Father   . Arthritis Mother   . Stroke Brother   . Cancer Sister        unknown kind  . Diabetes Sister   . Dementia Sister   . Cancer Niece        Breast  . Cancer Brother   . Healthy Son   . Healthy Son   . Healthy Son   . Healthy Daughter   . Healthy Daughter   . Healthy Daughter   . Sarcoidosis Daughter     CURRENT MEDICATIONS:  Current Outpatient Medications  Medication Sig Dispense Refill  . acyclovir (ZOVIRAX) 400 MG tablet Take 1 tablet (400 mg total) by mouth 2 (two) times daily. 60 tablet 6  . amLODipine (NORVASC) 10 MG tablet Take 1 tablet (10 mg total) by mouth daily. 90 tablet 3  . Blood Glucose Monitoring Suppl (BLOOD GLUCOSE SYSTEM PAK) KIT Please dispense based on patient and insurance preference. Use as directed to monitor FSBS 2x daily. Dx: E11.9. 1 each 1  . cyanocobalamin (,VITAMIN B-12,) 1000 MCG/ML injection Inject 1 mL (1,000 mcg total) into the  muscle every 30 (thirty) days. 1 mL 0  . gabapentin (NEURONTIN) 300 MG capsule Take 1 capsule (300 mg total) by mouth at bedtime. 90 capsule 3  . Garlic (GARLIQUE) 505 MG TBEC Take 400 mg by mouth every evening.     Marland Kitchen glipiZIDE (GLUCOTROL) 5 MG tablet Take 1 tablet (5 mg total) by mouth 2 (two) times daily. 180 tablet 3  . Glucose Blood (BLOOD GLUCOSE TEST STRIPS) STRP Please dispense based on patient and insurance preference. Use as directed to monitor FSBS 2x daily. Dx: E11.9. 100 each 11  . HM LORATADINE 10 MG tablet TAKE ONE TABLET (10MG  TOTAL) BY MOUTH DAILY 30 tablet 0  . hydrochlorothiazide (MICROZIDE) 12.5 MG capsule TAKE ONE CAPSULE BY MOUTH DAILY (Patient taking differently: Take 12.5 mg by mouth daily. ) 90 capsule 3  . HYDROcodone-acetaminophen (NORCO/VICODIN) 5-325 MG tablet Take 1 tablet by mouth every 6 (six) hours as needed for moderate pain. 60 tablet 0  . Lancets MISC Please dispense based on patient and insurance preference. Use as directed to monitor FSBS 2x daily. Dx: E11.9. 100 each 11  . lenalidomide (REVLIMID) 15 MG capsule Take 1 capsule (15 mg total) by mouth daily. 21 capsule 0  . metFORMIN (GLUCOPHAGE) 1000 MG tablet TAKE ONE TABLET BY MOUTH TWICE A DAY 60 tablet 3  . metoprolol tartrate (LOPRESSOR) 25 MG tablet Take 0.5 tablets (12.5 mg total) by mouth 2 (two) times daily. 90 tablet 3  . Omega-3 Fatty Acids (OMEGA-3 FISH OIL PO) Take 1 capsule by mouth every evening.     Marland Kitchen omeprazole (PRILOSEC) 40 MG capsule TAKE ONE CAPSULE BY MOUTH DAILY (Patient taking differently: Take 40 mg by mouth daily. ) 90 capsule 3  . prochlorperazine (COMPAZINE) 10 MG tablet Take 1 tablet (10 mg total) by mouth every 6 (six) hours as needed (Nausea or vomiting). 30 tablet 1  . simvastatin (ZOCOR) 40 MG tablet TAKE ONE TABLET (40MG TOTAL) BY MOUTH BEDTIME (Patient taking differently: Take 40 mg by mouth at bedtime. ) 90 tablet 3  . SYNTHROID 150 MCG tablet Take 150 mcg by mouth daily.    Marland Kitchen  tolterodine (DETROL LA) 4 MG 24 hr capsule TAKE ONE CAPSULE BY MOUTH DAILY (Patient taking differently: Take 4 mg by mouth daily. ) 90 capsule 3  . traMADol (ULTRAM) 50 MG tablet Take 1 tablet (50 mg total) by mouth every 6 (six) hours as needed. 30 tablet 0  . meclizine (ANTIVERT) 25 MG tablet Take 25 mg by mouth every 6 (six) hours as needed for dizziness. (Patient not taking: Reported on 10/29/2019)     No current facility-administered medications for this visit.    ALLERGIES:  No Known Allergies  PHYSICAL EXAM:  Performance status (ECOG): 2 - Symptomatic, <50% confined to bed  Vitals:   10/29/19 0923  BP: (!) 131/47  Pulse: 74  Resp: 18  Temp: (!) 96.8 F (36 C)  SpO2: 97%   Wt Readings from Last 3 Encounters:  10/29/19 176 lb 11.2 oz (80.2 kg)  09/20/19 162 lb 8 oz (73.7 kg)  09/12/19 162 lb 8 oz (73.7 kg)   Physical Exam Vitals reviewed.  Constitutional:      Appearance: Normal appearance. She is obese.  Cardiovascular:     Rate and Rhythm: Normal rate and regular rhythm.     Pulses: Normal pulses.     Heart sounds: Normal heart sounds.  Pulmonary:     Effort: Pulmonary effort is normal.     Breath sounds: Normal breath sounds.  Skin:    Findings: No lesion or rash.  Neurological:     General: No focal deficit present.     Mental Status: She is alert and oriented to person, place, and time.  Psychiatric:        Mood and Affect: Mood normal.        Behavior: Behavior normal.     LABORATORY DATA:  I have reviewed the labs as listed.  CBC Latest Ref Rng & Units 10/29/2019 09/27/2019 09/11/2019  WBC 4.0 - 10.5 K/uL 10.7(H) 9.2 11.3(H)  Hemoglobin 12.0 - 15.0 g/dL 7.2(L) 8.3(L) 9.0(L)  Hematocrit 36 - 46 % 24.3(L) 27.3(L) 29.4(L)  Platelets 150 - 400 K/uL 244 239 275   CMP Latest Ref Rng & Units 10/29/2019 09/04/2019 08/17/2019  Glucose 70 - 99 mg/dL 159(H) 219(H) 212(H)  BUN 8 - 23 mg/dL 17 19 15   Creatinine 0.44 - 1.00 mg/dL 0.95 0.84 0.88  Sodium 135 - 145  mmol/L 136 139 141  Potassium 3.5 - 5.1 mmol/L 4.1 3.3(L) 3.5  Chloride 98 - 111 mmol/L 103 107 106  CO2 22 - 32 mmol/L 26 24 26   Calcium 8.9 - 10.3 mg/dL 9.3 9.2 9.1  Total Protein 6.5 - 8.1 g/dL 8.4(H) 8.1 7.5  Total Bilirubin 0.3 - 1.2 mg/dL 0.8 0.5 0.5  Alkaline Phos 38 - 126 U/L 63 59 -  AST 15 - 41 U/L 20 15 15   ALT 0 - 44 U/L 14 11 12     DIAGNOSTIC IMAGING:  I have independently reviewed the scans and discussed with the patient. No results found.   ASSESSMENT:  1. Normocytic anemia: -Patient seen at the request of Dr. Buelah Manis for further work-up and management of normocytic anemia. -Recent CBC on 08/21/2019 shows hemoglobin 6.9 with MCV of 88.3. White count and platelets are normal. No history of CKD. Denies any bleeding per rectum or melena. Last colonoscopy on 04/01/2014 shows diffusely pigmented rectal mucosa consistent with melanosis Kim Mills otherwise normal mucosa. Diffusely pigmented colonic mucosa, 2 diminutive polyps in the mid ascending segment, otherwise the remainder of the colon mucosa was normal. Biopsy consistent with tubular adenoma. -CT renal study on 05/11/2019 shows normal-sized spleen with normal liver. No other abnormal adenopathy. -Denies any prior history of blood transfusion. She is currently taking iron tablet twice daily.  2.  IgG lambda plasma cell myeloma: -Skeletal survey on 09/12/2019 shows diffuse faint lucencies in the skull, bilateral femurs. -Bone marrow biopsy on 09/27/2019 shows 70% plasma cells in hypercellular marrow.  Chromosome analysis was 31, XX. FISH panel was normal.  However quality of specimen is compromised. -LDH normal, beta-2 microglobulin 2.9.  M spike was 2.1 g.  Kappa light chains 19.7, lambda light chains 14.3 with ratio of 1.38. -24-hour urine shows nonnephrotic range proteinuria.  Urine immunofixation was positive.   PLAN:  1.  IgG lambda plasma cell myeloma: -We reviewed her labs today.  LFTs are grossly normal.  Creatinine  is 0.95 and calcium 9.3.  Hemoglobin dropped to 7.2, likely from bone marrow infiltration by myeloma cells. -We talked about RVD regimen and side effects in detail. -She started Revlimid 15 mg 2 weeks on 1 week off today.  She will receive dexamethasone 20 mg prior to Velcade injection today. -She will be seen back in 2 weeks with labs.  2. Diabetes: -Continue glipizide and Metformin.  3. Hypertension: -Continue amlodipine and HCTZ.  4. Hypothyroidism: -Continue Synthroid daily.  5. B12 deficiency: -Continue B12 monthly injections.  6.  Right posterior hip pain: -Continue hydrocodone 5/325 every 6 hours as needed.  7.  Infection prophylaxis: -Start acyclovir 400 mg twice daily.   Orders placed this encounter:  No orders of the defined types were placed in this encounter.    Derek Jack, MD [place] [phone]   I, Milinda Antis, am acting as a scribe for Dr. Sanda Linger.  I, Derek Jack MD, have reviewed the above documentation for accuracy and completeness, and I agree with the above.

## 2019-10-29 NOTE — Progress Notes (Signed)
Patient was assessed by Dr. Delton Coombes and labs have been reviewed.  Hemoglobin 7.2, no orders received. Patient is okay to proceed with treatment today. Primary RN and pharmacy aware.

## 2019-10-30 LAB — PROTEIN ELECTROPHORESIS, SERUM
A/G Ratio: 0.7 (ref 0.7–1.7)
Albumin ELP: 3.4 g/dL (ref 2.9–4.4)
Alpha-1-Globulin: 0.3 g/dL (ref 0.0–0.4)
Alpha-2-Globulin: 0.8 g/dL (ref 0.4–1.0)
Beta Globulin: 1 g/dL (ref 0.7–1.3)
Gamma Globulin: 2.6 g/dL — ABNORMAL HIGH (ref 0.4–1.8)
Globulin, Total: 4.7 g/dL — ABNORMAL HIGH (ref 2.2–3.9)
M-Spike, %: 2.4 g/dL — ABNORMAL HIGH
Total Protein ELP: 8.1 g/dL (ref 6.0–8.5)

## 2019-10-30 LAB — KAPPA/LAMBDA LIGHT CHAINS
Kappa free light chain: 22.3 mg/L — ABNORMAL HIGH (ref 3.3–19.4)
Kappa, lambda light chain ratio: 1.07 (ref 0.26–1.65)
Lambda free light chains: 20.9 mg/L (ref 5.7–26.3)

## 2019-10-31 LAB — IMMUNOFIXATION ELECTROPHORESIS
IgA: 85 mg/dL (ref 64–422)
IgG (Immunoglobin G), Serum: 3150 mg/dL — ABNORMAL HIGH (ref 586–1602)
IgM (Immunoglobulin M), Srm: 7 mg/dL — ABNORMAL LOW (ref 26–217)
Total Protein ELP: 8 g/dL (ref 6.0–8.5)

## 2019-11-05 ENCOUNTER — Encounter (HOSPITAL_COMMUNITY): Payer: Self-pay

## 2019-11-05 ENCOUNTER — Other Ambulatory Visit: Payer: Self-pay

## 2019-11-05 ENCOUNTER — Inpatient Hospital Stay (HOSPITAL_COMMUNITY): Payer: Medicare Other

## 2019-11-05 VITALS — BP 141/50 | HR 83 | Temp 96.9°F | Resp 17

## 2019-11-05 DIAGNOSIS — C9000 Multiple myeloma not having achieved remission: Secondary | ICD-10-CM

## 2019-11-05 DIAGNOSIS — E119 Type 2 diabetes mellitus without complications: Secondary | ICD-10-CM | POA: Diagnosis not present

## 2019-11-05 DIAGNOSIS — I10 Essential (primary) hypertension: Secondary | ICD-10-CM | POA: Diagnosis not present

## 2019-11-05 DIAGNOSIS — D649 Anemia, unspecified: Secondary | ICD-10-CM | POA: Diagnosis not present

## 2019-11-05 DIAGNOSIS — Z5112 Encounter for antineoplastic immunotherapy: Secondary | ICD-10-CM | POA: Diagnosis not present

## 2019-11-05 DIAGNOSIS — M25551 Pain in right hip: Secondary | ICD-10-CM | POA: Diagnosis not present

## 2019-11-05 LAB — CBC WITH DIFFERENTIAL/PLATELET
Abs Immature Granulocytes: 0.2 10*3/uL — ABNORMAL HIGH (ref 0.00–0.07)
Basophils Absolute: 0 10*3/uL (ref 0.0–0.1)
Basophils Relative: 0 %
Eosinophils Absolute: 0.4 10*3/uL (ref 0.0–0.5)
Eosinophils Relative: 4 %
HCT: 24.6 % — ABNORMAL LOW (ref 36.0–46.0)
Hemoglobin: 7.3 g/dL — ABNORMAL LOW (ref 12.0–15.0)
Immature Granulocytes: 2 %
Lymphocytes Relative: 52 %
Lymphs Abs: 4.6 10*3/uL — ABNORMAL HIGH (ref 0.7–4.0)
MCH: 27.1 pg (ref 26.0–34.0)
MCHC: 29.7 g/dL — ABNORMAL LOW (ref 30.0–36.0)
MCV: 91.4 fL (ref 80.0–100.0)
Monocytes Absolute: 0.4 10*3/uL (ref 0.1–1.0)
Monocytes Relative: 5 %
Neutro Abs: 3.3 10*3/uL (ref 1.7–7.7)
Neutrophils Relative %: 37 %
Platelets: 212 10*3/uL (ref 150–400)
RBC: 2.69 MIL/uL — ABNORMAL LOW (ref 3.87–5.11)
RDW: 22.1 % — ABNORMAL HIGH (ref 11.5–15.5)
WBC: 9 10*3/uL (ref 4.0–10.5)
nRBC: 3.1 % — ABNORMAL HIGH (ref 0.0–0.2)

## 2019-11-05 LAB — COMPREHENSIVE METABOLIC PANEL WITH GFR
ALT: 12 U/L (ref 0–44)
AST: 15 U/L (ref 15–41)
Albumin: 3.5 g/dL (ref 3.5–5.0)
Alkaline Phosphatase: 56 U/L (ref 38–126)
Anion gap: 10 (ref 5–15)
BUN: 15 mg/dL (ref 8–23)
CO2: 24 mmol/L (ref 22–32)
Calcium: 8.9 mg/dL (ref 8.9–10.3)
Chloride: 104 mmol/L (ref 98–111)
Creatinine, Ser: 0.93 mg/dL (ref 0.44–1.00)
GFR calc Af Amer: >60 mL/min (ref >60)
GFR calc non Af Amer: 60 mL/min — ABNORMAL LOW (ref >60)
Glucose, Bld: 213 mg/dL — ABNORMAL HIGH (ref 70–99)
Potassium: 4 mmol/L (ref 3.5–5.1)
Sodium: 138 mmol/L (ref 135–145)
Total Bilirubin: 0.8 mg/dL (ref 0.3–1.2)
Total Protein: 7.5 g/dL (ref 6.5–8.1)

## 2019-11-05 MED ORDER — BORTEZOMIB CHEMO SQ INJECTION 3.5 MG (2.5MG/ML)
1.3000 mg/m2 | Freq: Once | INTRAMUSCULAR | Status: AC
  Administered 2019-11-05: 2.25 mg via SUBCUTANEOUS
  Filled 2019-11-05: qty 0.9

## 2019-11-05 MED ORDER — DEXAMETHASONE 4 MG PO TABS
20.0000 mg | ORAL_TABLET | Freq: Once | ORAL | Status: AC
  Administered 2019-11-05: 20 mg via ORAL
  Filled 2019-11-05: qty 5

## 2019-11-05 NOTE — Patient Instructions (Signed)
[facility] Discharge Instructions for Patients Receiving Chemotherapy   Beginning January 23rd 2017 lab work for the Cancer Center will be done in the  Main lab at [facility] on 1st floor. If you have a lab appointment with the Cancer Center please come in thru the  Main Entrance and check in at the main information desk   Today you received the following chemotherapy agents Velcade injection. Follow-up as scheduled  To help prevent nausea and vomiting after your treatment, we encourage you to take your nausea medication   If you develop nausea and vomiting, or diarrhea that is not controlled by your medication, call the clinic.  The clinic phone number is [phone]. Office hours are Monday-Friday 8:30am-5:00pm.  BELOW ARE SYMPTOMS THAT SHOULD BE REPORTED IMMEDIATELY:  *FEVER GREATER THAN 101.0 F  *CHILLS WITH OR WITHOUT FEVER  NAUSEA AND VOMITING THAT IS NOT CONTROLLED WITH YOUR NAUSEA MEDICATION  *UNUSUAL SHORTNESS OF BREATH  *UNUSUAL BRUISING OR BLEEDING  TENDERNESS IN MOUTH AND THROAT WITH OR WITHOUT PRESENCE OF ULCERS  *URINARY PROBLEMS  *BOWEL PROBLEMS  UNUSUAL RASH Items with * indicate a potential emergency and should be followed up as soon as possible. If you have an emergency after office hours please contact your primary care physician or go to the nearest emergency department.  Please call the clinic during office hours if you have any questions or concerns.   You may also contact the Patient Navigator at [phone] should you have any questions or need assistance in obtaining follow up care.      Resources For Cancer Patients and their Caregivers ? American Cancer Society: Can assist with transportation, wigs, general needs, runs Look Good Feel Better.        1-888-227-6333 ? Cancer Care: Provides financial assistance, online support groups, medication/co-pay assistance.  1-800-813-HOPE (4673) ? [facility] Assists [place] cancer patients and their families through emotional , educational and financial support.  336-427-4357 ? [place] DSS Where to apply for food stamps, Medicaid and utility assistance. 336-342-1394 ? [facility]: Transportation to medical appointments. 336-347-2287 ? Social Security Administration: May apply for disability if have a Stage IV cancer. 336-342-7796 1-800-772-1213 ? [place] Aging, Disability and Transit Services: Assists with nutrition, care and transit needs. 336-349-2343         

## 2019-11-05 NOTE — Progress Notes (Signed)
[place] reviewed with Dr. Delton Coombes and pt approved for Velcade injection today per MD                                                       Kim Mills tolerated Velcade injection well without complaints or incident. VSS Pt discharged via wheelchair in satisfactory condition accompanied by family member

## 2019-11-12 ENCOUNTER — Inpatient Hospital Stay (HOSPITAL_COMMUNITY): Payer: Medicare Other

## 2019-11-12 ENCOUNTER — Other Ambulatory Visit: Payer: Self-pay

## 2019-11-12 ENCOUNTER — Inpatient Hospital Stay (HOSPITAL_COMMUNITY): Payer: Medicare Other | Attending: Hematology

## 2019-11-12 ENCOUNTER — Inpatient Hospital Stay (HOSPITAL_BASED_OUTPATIENT_CLINIC_OR_DEPARTMENT_OTHER): Payer: Medicare Other | Admitting: Hematology

## 2019-11-12 ENCOUNTER — Encounter (HOSPITAL_COMMUNITY): Payer: Self-pay | Admitting: Hematology

## 2019-11-12 VITALS — BP 152/52 | HR 80 | Temp 97.2°F | Resp 17 | Wt 181.0 lb

## 2019-11-12 DIAGNOSIS — C9000 Multiple myeloma not having achieved remission: Secondary | ICD-10-CM

## 2019-11-12 DIAGNOSIS — Z23 Encounter for immunization: Secondary | ICD-10-CM | POA: Diagnosis not present

## 2019-11-12 DIAGNOSIS — Z5112 Encounter for antineoplastic immunotherapy: Secondary | ICD-10-CM | POA: Insufficient documentation

## 2019-11-12 DIAGNOSIS — D649 Anemia, unspecified: Secondary | ICD-10-CM | POA: Diagnosis not present

## 2019-11-12 LAB — CBC WITH DIFFERENTIAL/PLATELET
Abs Immature Granulocytes: 0.07 10*3/uL (ref 0.00–0.07)
Basophils Absolute: 0 10*3/uL (ref 0.0–0.1)
Basophils Relative: 0 %
Eosinophils Absolute: 0.3 10*3/uL (ref 0.0–0.5)
Eosinophils Relative: 4 %
HCT: 23.6 % — ABNORMAL LOW (ref 36.0–46.0)
Hemoglobin: 7.2 g/dL — ABNORMAL LOW (ref 12.0–15.0)
Immature Granulocytes: 1 %
Lymphocytes Relative: 44 %
Lymphs Abs: 3 10*3/uL (ref 0.7–4.0)
MCH: 27.8 pg (ref 26.0–34.0)
MCHC: 30.5 g/dL (ref 30.0–36.0)
MCV: 91.1 fL (ref 80.0–100.0)
Monocytes Absolute: 0.5 10*3/uL (ref 0.1–1.0)
Monocytes Relative: 8 %
Neutro Abs: 2.9 10*3/uL (ref 1.7–7.7)
Neutrophils Relative %: 43 %
Platelets: 181 10*3/uL (ref 150–400)
RBC: 2.59 MIL/uL — ABNORMAL LOW (ref 3.87–5.11)
RDW: 23 % — ABNORMAL HIGH (ref 11.5–15.5)
WBC: 6.9 10*3/uL (ref 4.0–10.5)
nRBC: 3.4 % — ABNORMAL HIGH (ref 0.0–0.2)

## 2019-11-12 LAB — FERRITIN: Ferritin: 256 ng/mL (ref 11–307)

## 2019-11-12 LAB — COMPREHENSIVE METABOLIC PANEL
ALT: 36 U/L (ref 0–44)
AST: 29 U/L (ref 15–41)
Albumin: 3.3 g/dL — ABNORMAL LOW (ref 3.5–5.0)
Alkaline Phosphatase: 76 U/L (ref 38–126)
Anion gap: 11 (ref 5–15)
BUN: 19 mg/dL (ref 8–23)
CO2: 26 mmol/L (ref 22–32)
Calcium: 8.6 mg/dL — ABNORMAL LOW (ref 8.9–10.3)
Chloride: 99 mmol/L (ref 98–111)
Creatinine, Ser: 1.03 mg/dL — ABNORMAL HIGH (ref 0.44–1.00)
GFR calc Af Amer: >60 mL/min (ref >60)
GFR calc non Af Amer: 53 mL/min — ABNORMAL LOW (ref >60)
Glucose, Bld: 203 mg/dL — ABNORMAL HIGH (ref 70–99)
Potassium: 3.7 mmol/L (ref 3.5–5.1)
Sodium: 136 mmol/L (ref 135–145)
Total Bilirubin: 0.9 mg/dL (ref 0.3–1.2)
Total Protein: 6.6 g/dL (ref 6.5–8.1)

## 2019-11-12 LAB — FOLATE: Folate: 8.4 ng/mL (ref >5.9)

## 2019-11-12 LAB — IRON AND TIBC
Iron: 69 ug/dL (ref 28–170)
Saturation Ratios: 23 % (ref 10.4–31.8)
TIBC: 297 ug/dL (ref 250–450)
UIBC: 228 ug/dL

## 2019-11-12 LAB — VITAMIN B12: Vitamin B-12: 216 pg/mL (ref 180–914)

## 2019-11-12 LAB — LACTATE DEHYDROGENASE: LDH: 146 U/L (ref 98–192)

## 2019-11-12 MED ORDER — BORTEZOMIB CHEMO SQ INJECTION 3.5 MG (2.5MG/ML)
1.3000 mg/m2 | Freq: Once | INTRAMUSCULAR | Status: AC
  Administered 2019-11-12: 2.25 mg via SUBCUTANEOUS
  Filled 2019-11-12: qty 0.9

## 2019-11-12 MED ORDER — DEXAMETHASONE 4 MG PO TABS
20.0000 mg | ORAL_TABLET | Freq: Once | ORAL | Status: AC
  Administered 2019-11-12: 20 mg via ORAL
  Filled 2019-11-12: qty 5

## 2019-11-12 NOTE — Patient Instructions (Signed)
[facility] Discharge Instructions for Patients Receiving Chemotherapy   Beginning January 23rd 2017 lab work for the Cancer Center will be done in the  Main lab at [facility] on 1st floor. If you have a lab appointment with the Cancer Center please come in thru the  Main Entrance and check in at the main information desk   Today you received the following chemotherapy agents Velcade injection. Follow-up as scheduled  To help prevent nausea and vomiting after your treatment, we encourage you to take your nausea medication   If you develop nausea and vomiting, or diarrhea that is not controlled by your medication, call the clinic.  The clinic phone number is [phone]. Office hours are Monday-Friday 8:30am-5:00pm.  BELOW ARE SYMPTOMS THAT SHOULD BE REPORTED IMMEDIATELY:  *FEVER GREATER THAN 101.0 F  *CHILLS WITH OR WITHOUT FEVER  NAUSEA AND VOMITING THAT IS NOT CONTROLLED WITH YOUR NAUSEA MEDICATION  *UNUSUAL SHORTNESS OF BREATH  *UNUSUAL BRUISING OR BLEEDING  TENDERNESS IN MOUTH AND THROAT WITH OR WITHOUT PRESENCE OF ULCERS  *URINARY PROBLEMS  *BOWEL PROBLEMS  UNUSUAL RASH Items with * indicate a potential emergency and should be followed up as soon as possible. If you have an emergency after office hours please contact your primary care physician or go to the nearest emergency department.  Please call the clinic during office hours if you have any questions or concerns.   You may also contact the Patient Navigator at [phone] should you have any questions or need assistance in obtaining follow up care.      Resources For Cancer Patients and their Caregivers ? American Cancer Society: Can assist with transportation, wigs, general needs, runs Look Good Feel Better.        1-888-227-6333 ? Cancer Care: Provides financial assistance, online support groups, medication/co-pay assistance.  1-800-813-HOPE (4673) ? [facility] Assists [place] cancer patients and their families through emotional , educational and financial support.  336-427-4357 ? [place] DSS Where to apply for food stamps, Medicaid and utility assistance. 336-342-1394 ? [facility]: Transportation to medical appointments. 336-347-2287 ? Social Security Administration: May apply for disability if have a Stage IV cancer. 336-342-7796 1-800-772-1213 ? [place] Aging, Disability and Transit Services: Assists with nutrition, care and transit needs. 336-349-2343         

## 2019-11-12 NOTE — Progress Notes (Signed)
Patient was assessed by Dr. Katragadda and labs have been reviewed.  Patient is okay to proceed with treatment today. Primary RN and pharmacy aware.   

## 2019-11-12 NOTE — Patient Instructions (Signed)
[place] at [facility] Discharge Instructions  You were seen today by Dr. Delton Coombes. He went over your recent results. You received your treatment today; continue receiving your weekly treatments. Stop taking the Revlimid today; take it only for 2 weeks, then take 1 week off. Dr. Delton Coombes will see you back in 2 weeks for labs and follow up.   Thank you for choosing [place] at [facility] to provide your oncology and hematology care.  To afford each patient quality time with our provider, please arrive at least 15 minutes before your scheduled appointment time.   If you have a lab appointment with the [facility] please come in thru the Main Entrance and check in at the main information desk  You need to re-schedule your appointment should you arrive 10 or more minutes late.  We strive to give you quality time with our providers, and arriving late affects you and other patients whose appointments are after yours.  Also, if you no show three or more times for appointments you may be dismissed from the clinic at the providers discretion.     Again, thank you for choosing [facility].  Our hope is that these requests will decrease the amount of time that you wait before being seen by our physicians.       _____________________________________________________________  Should you have questions after your visit to [facility], please contact our office at [phone] between the hours of 8:00 a.m. and 4:30 p.m.  Voicemails left after 4:00 p.m. will not be returned until the following business day.  For prescription refill requests, have your pharmacy contact our office and allow 72 hours.    Cancer Center Support Programs:   > Cancer Support Group  2nd Tuesday of the month 1pm-2pm, Journey Room

## 2019-11-12 NOTE — Progress Notes (Signed)
Kim Mills, [place] [id number]   CLINIC:  Medical Oncology/Hematology  PCP:  Alycia Rossetti, MD [address] [place] [address] [phone]   REASON FOR VISIT:  Follow-up for normocytic anemia and multiple myeloma  PRIOR THERAPY: None  NGS Results: Not done  CURRENT THERAPY: Bortezomib weekly; Revlimid 2 weeks on, 1 week off  BRIEF ONCOLOGIC HISTORY:  Oncology History  Multiple myeloma not having achieved remission ([place])  10/13/2019 Initial Diagnosis   Multiple myeloma not having achieved remission ([facility])   10/29/2019 -  Chemotherapy   The patient had dexamethasone (DECADRON) tablet 20 mg, 20 mg (100 % of original dose 20 mg), Oral,  Once, 1 of 8 cycles Dose modification: 20 mg (original dose 20 mg, Cycle 1) Administration: 20 mg (10/29/2019), 20 mg (11/05/2019) bortezomib SQ (VELCADE) chemo injection 2.25 mg, 1.3 mg/m2 = 2.25 mg, Subcutaneous,  Once, 1 of 8 cycles Administration: 2.25 mg (10/29/2019), 2.25 mg (11/05/2019)  for chemotherapy treatment.      CANCER STAGING: Cancer Staging No matching staging information was found for the patient.  INTERVAL HISTORY:  Kim Mills, a 79 y.o. female, returns for routine follow-up and consideration for next cycle of chemotherapy. Kim Mills was last seen on 10/29/2019.  Due for day #15 of cycle #1 of bortezomib today.   Today she is accompanied by her daughter. Overall, she tells me she has been feeling good. She has 1 week of her Revlimid left and she is tolerating the treatment well. She gets mild occasional nausea and takes Compazine, though she does not take it every time. She only gets bruising in her abdomen when she gets the subcutaneous injection. She denies having severe fatigue and her appetite is good, eating every meal she gets. She is currently trying to get up to 8 glasses of water daily.  Overall, she feels ready for next cycle of chemo today.    REVIEW OF  SYSTEMS:  Review of Systems  Constitutional: Positive for appetite change (75%) and fatigue (75%).  HENT:   Positive for trouble swallowing (w/ solids).   Cardiovascular: Positive for chest pain (intermittent) and leg swelling.  Gastrointestinal: Positive for constipation and nausea.  Neurological: Positive for headaches and numbness (toes & feet).  All other systems reviewed and are negative.   PAST MEDICAL/SURGICAL HISTORY:  Past Medical History:  Diagnosis Date  . Arthritis   . Cataract   . Chest pain    Associated with weakness and fatigue  . Diabetes mellitus    A1c of 7.4 in 08/2010  . GERD (gastroesophageal reflux disease)   . Glaucoma   . Hyperlipidemia    Lipid profile in 08/2010:190, 121, 48, 118; normal CBC and CMet  . Hypertension    Lipid profile in 08/2010:190, 121, 48, 118; normal CBC and CMet  . Hypothyroidism   . Hypothyroidism   . Obesity    Past Surgical History:  Procedure Laterality Date  . CHOLECYSTECTOMY    . COLONOSCOPY N/A 04/01/2014   RMR: Melanosis coli. colonic polyps removed as described above.   . CYSTOSCOPY W/ URETERAL STENT PLACEMENT Right 09/13/2019   Procedure: CYSTOSCOPY WITH RIGHT  RETROGRADE PYELOGRAM; BLADDER BIOPSY;  Surgeon: Cleon Gustin, MD;  Location: [facility];  Service: Urology;  Laterality: Right;  . LEFT HEART CATH AND CORONARY ANGIOGRAPHY N/A 10/07/2017   Procedure: LEFT HEART CATH AND CORONARY ANGIOGRAPHY;  Surgeon: Troy Sine, MD;  Location: [facility];  Service: Cardiovascular;  Laterality: N/A;  . THYROIDECTOMY, PARTIAL      SOCIAL HISTORY:  Social History   Socioeconomic History  . Marital status: Widowed    Spouse name: Not on file  . Number of children: 7  . Years of education: Not on file  . Highest education level: Not on file  Occupational History    Employer: RETIRED  Tobacco Use  . Smoking status: Passive Smoke Exposure - Never Smoker  . Smokeless tobacco: Never Used  Vaping Use  . Vaping Use:  Never used  Substance and Sexual Activity  . Alcohol use: Never  . Drug use: Never  . Sexual activity: Not Currently  Other Topics Concern  . Not on file  Social History Narrative  . Not on file   Social Determinants of Health   Financial Resource Strain:   . Difficulty of Paying Living Expenses: Not on file  Food Insecurity:   . Worried About Charity fundraiser in the Last Year: Not on file  . Ran Out of Food in the Last Year: Not on file  Transportation Needs:   . Lack of Transportation (Medical): Not on file  . Lack of Transportation (Non-Medical): Not on file  Physical Activity:   . Days of Exercise per Week: Not on file  . Minutes of Exercise per Session: Not on file  Stress:   . Feeling of Stress : Not on file  Social Connections:   . Frequency of Communication with Friends and Family: Not on file  . Frequency of Social Gatherings with Friends and Family: Not on file  . Attends Religious Services: Not on file  . Active Member of Clubs or Organizations: Not on file  . Attends Archivist Meetings: Not on file  . Marital Status: Not on file  Intimate Partner Violence:   . Fear of Current or Ex-Partner: Not on file  . Emotionally Abused: Not on file  . Physically Abused: Not on file  . Sexually Abused: Not on file    FAMILY HISTORY:  Family History  Problem Relation Age of Onset  . Anemia Father   . Arthritis Mother   . Stroke Brother   . Cancer Sister        unknown kind  . Diabetes Sister   . Dementia Sister   . Cancer Niece        Breast  . Cancer Brother   . Healthy Son   . Healthy Son   . Healthy Son   . Healthy Daughter   . Healthy Daughter   . Healthy Daughter   . Sarcoidosis Daughter     CURRENT MEDICATIONS:  Current Outpatient Medications  Medication Sig Dispense Refill  . acyclovir (ZOVIRAX) 400 MG tablet Take 1 tablet (400 mg total) by mouth 2 (two) times daily. 60 tablet 6  . amLODipine (NORVASC) 10 MG tablet Take 1 tablet (10  mg total) by mouth daily. 90 tablet 3  . ASPIRIN 81 PO Take 1 tablet by mouth daily.    . Blood Glucose Monitoring Suppl (BLOOD GLUCOSE SYSTEM PAK) KIT Please dispense based on patient and insurance preference. Use as directed to monitor FSBS 2x daily. Dx: E11.9. 1 each 1  . cyanocobalamin (,VITAMIN B-12,) 1000 MCG/ML injection Inject 1 mL (1,000 mcg total) into the muscle every 30 (thirty) days. 1 mL 0  . gabapentin (NEURONTIN) 300 MG capsule Take 1 capsule (300 mg total) by mouth at bedtime. 90 capsule 3  .  Garlic (GARLIQUE) 258 MG TBEC Take 400 mg by mouth every evening.     Marland Kitchen glipiZIDE (GLUCOTROL) 5 MG tablet Take 1 tablet (5 mg total) by mouth 2 (two) times daily. 180 tablet 3  . Glucose Blood (BLOOD GLUCOSE TEST STRIPS) STRP Please dispense based on patient and insurance preference. Use as directed to monitor FSBS 2x daily. Dx: E11.9. 100 each 11  . HM LORATADINE 10 MG tablet TAKE ONE TABLET (10MG TOTAL) BY MOUTH DAILY 30 tablet 0  . hydrochlorothiazide (MICROZIDE) 12.5 MG capsule TAKE ONE CAPSULE BY MOUTH DAILY (Patient taking differently: Take 12.5 mg by mouth daily. ) 90 capsule 3  . HYDROcodone-acetaminophen (NORCO/VICODIN) 5-325 MG tablet Take 1 tablet by mouth every 6 (six) hours as needed for moderate pain. 60 tablet 0  . Lancets MISC Please dispense based on patient and insurance preference. Use as directed to monitor FSBS 2x daily. Dx: E11.9. 100 each 11  . lenalidomide (REVLIMID) 15 MG capsule Take 1 capsule (15 mg total) by mouth daily. 21 capsule 0  . meclizine (ANTIVERT) 25 MG tablet Take 25 mg by mouth every 6 (six) hours as needed for dizziness. (Patient not taking: Reported on 10/29/2019)    . metFORMIN (GLUCOPHAGE) 1000 MG tablet TAKE ONE TABLET BY MOUTH TWICE A DAY 60 tablet 3  . metoprolol tartrate (LOPRESSOR) 25 MG tablet Take 0.5 tablets (12.5 mg total) by mouth 2 (two) times daily. 90 tablet 3  . Omega-3 Fatty Acids (OMEGA-3 FISH OIL PO) Take 1 capsule by mouth every  evening.     Marland Kitchen omeprazole (PRILOSEC) 40 MG capsule TAKE ONE CAPSULE BY MOUTH DAILY (Patient taking differently: Take 40 mg by mouth daily. ) 90 capsule 3  . prochlorperazine (COMPAZINE) 10 MG tablet Take 1 tablet (10 mg total) by mouth every 6 (six) hours as needed (Nausea or vomiting). 30 tablet 1  . simvastatin (ZOCOR) 40 MG tablet TAKE ONE TABLET (40MG TOTAL) BY MOUTH BEDTIME (Patient taking differently: Take 40 mg by mouth at bedtime. ) 90 tablet 3  . SYNTHROID 150 MCG tablet Take 150 mcg by mouth daily.    Marland Kitchen tolterodine (DETROL LA) 4 MG 24 hr capsule TAKE ONE CAPSULE BY MOUTH DAILY (Patient taking differently: Take 4 mg by mouth daily. ) 90 capsule 3  . traMADol (ULTRAM) 50 MG tablet Take 1 tablet (50 mg total) by mouth every 6 (six) hours as needed. 30 tablet 0   No current facility-administered medications for this visit.    ALLERGIES:  No Known Allergies  PHYSICAL EXAM:  Performance status (ECOG): 2 - Symptomatic, <50% confined to bed  Vitals:   11/12/19 1017  BP: (!) 152/52  Pulse: 80  Resp: 17  Temp: (!) 97.2 F (36.2 C)  SpO2: 100%   Wt Readings from Last 3 Encounters:  11/12/19 181 lb (82.1 kg)  10/29/19 176 lb 11.2 oz (80.2 kg)  09/20/19 162 lb 8 oz (73.7 kg)   Physical Exam Vitals reviewed.  Constitutional:      Appearance: Normal appearance. She is obese.  Cardiovascular:     Rate and Rhythm: Normal rate and regular rhythm.     Pulses: Normal pulses.     Heart sounds: Normal heart sounds.  Pulmonary:     Effort: Pulmonary effort is normal.     Breath sounds: Normal breath sounds.  Neurological:     General: No focal deficit present.     Mental Status: She is alert and oriented to person, place, and time.  Psychiatric:        Mood and Affect: Mood normal.        Behavior: Behavior normal.     LABORATORY DATA:  I have reviewed the labs as listed.  CBC Latest Ref Rng & Units 11/12/2019 11/05/2019 10/29/2019  WBC 4.0 - 10.5 K/uL 6.9 9.0 10.7(H)    Hemoglobin 12.0 - 15.0 g/dL 7.2(L) 7.3(L) 7.2(L)  Hematocrit 36 - 46 % 23.6(L) 24.6(L) 24.3(L)  Platelets 150 - 400 K/uL 181 212 244   CMP Latest Ref Rng & Units 11/12/2019 11/05/2019 10/29/2019  Glucose 70 - 99 mg/dL 203(H) 213(H) 159(H)  BUN 8 - 23 mg/dL 19 15 17   Creatinine 0.44 - 1.00 mg/dL 1.03(H) 0.93 0.95  Sodium 135 - 145 mmol/L 136 138 136  Potassium 3.5 - 5.1 mmol/L 3.7 4.0 4.1  Chloride 98 - 111 mmol/L 99 104 103  CO2 22 - 32 mmol/L 26 24 26   Calcium 8.9 - 10.3 mg/dL 8.6(L) 8.9 9.3  Total Protein 6.5 - 8.1 g/dL 6.6 7.5 8.4(H)  Total Bilirubin 0.3 - 1.2 mg/dL 0.9 0.8 0.8  Alkaline Phos 38 - 126 U/L 76 56 63  AST 15 - 41 U/L 29 15 20   ALT 0 - 44 U/L 36 12 14   Lab Results  Component Value Date   LDH 146 11/12/2019   LDH 134 10/29/2019   LDH 151 09/12/2019   Lab Results  Component Value Date   TOTALPROTELP 8.0 10/29/2019   ALBUMINELP 3.4 10/29/2019   A1GS 0.3 10/29/2019   A2GS 0.8 10/29/2019   BETS 1.0 10/29/2019   GAMS 2.6 (H) 10/29/2019   MSPIKE 2.4 (H) 10/29/2019   SPEI Comment 10/29/2019    Lab Results  Component Value Date   KPAFRELGTCHN 22.3 (H) 10/29/2019   LAMBDASER 20.9 10/29/2019   KAPLAMBRATIO 1.07 10/29/2019    DIAGNOSTIC IMAGING:  I have independently reviewed the scans and discussed with the patient. No results found.   ASSESSMENT:  1. Normocytic anemia: -Patient seen at the request of Dr. Buelah Manis for further work-up and management of normocytic anemia. -Recent CBC on 08/21/2019 shows hemoglobin 6.9 with MCV of 88.3. White count and platelets are normal. No history of CKD. Denies any bleeding per rectum or melena. Last colonoscopy on 04/01/2014 shows diffusely pigmented rectal mucosa consistent with melanosis [name] Diffusely pigmented colonic mucosa, 2 diminutive polyps in the mid ascending segment, otherwise the remainder of the colon mucosa was normal. Biopsy consistent with tubular adenoma. -CT renal study on  05/11/2019 shows normal-sized spleen with normal liver. No other abnormal adenopathy. -Denies any prior history of blood transfusion. She is currently taking iron tablet twice daily.  2. IgG lambda plasma cell myeloma: -Skeletal survey on 09/12/2019 shows diffuse faint lucencies in the skull, bilateral femurs. -Bone marrow biopsy on 09/27/2019 shows 70% plasma cells in hypercellular marrow. Chromosome analysis was 46, XX. FISH panel was normal.  However quality of specimen is compromised. -LDH normal, beta-2 microglobulin 2.9. M spike was 2.1 g. Kappa light chains 19.7, lambda light chains 14.3 with ratio of 1.38. -24-hour urine shows nonnephrotic range proteinuria. Urine immunofixation was positive.   PLAN:  1.IgG lambda plasma cell myeloma: -She is tolerating Revlimid very well.  She is taking 15 mg 2 weeks on 1 week off. -She was told to stop Revlimid today. -She will receive Velcade injection today.  No neuropathy symptoms yet. -Recommend follow-up in 2 weeks after myeloma panel next week.  2. Diabetes: -Continue glipizide and Metformin.  3. Hypertension: -Continue amlodipine and HCTZ.  4. Hypothyroidism: -Continue Synthroid daily.  5. B12 deficiency: -Continue B12 monthly injections.  6. Right posterior hip pain: -Continue hydrocodone 5/325 every 6 hours as needed.  7.  Infection prophylaxis: -Continue acyclovir 400 mg twice daily.  Continue aspirin 81 mg daily.  8.  Normocytic anemia: -Hemoglobin is 7.2 with MCV of 91.  Likely from infiltration by myeloma cells. -Ferritin is 256, percent saturation is 23, folic acid and Z49 was normal.   Orders placed this encounter:  Orders Placed This Encounter  Procedures  . Ferritin  . Iron and TIBC  . Folate  . Vitamin B12     Derek Jack, MD [place] [phone]   I, Milinda Antis, am acting as a scribe for Dr. Sanda Linger.  I, Derek Jack MD, have reviewed  the above documentation for accuracy and completeness, and I agree with the above.

## 2019-11-12 NOTE — Progress Notes (Signed)
1102 Labs reviewed with and pt seen by Dr. Delton Coombes and pt approved for Velcade injection today per MD              [name] tolerated Velcade injection well without complaints or incident. Pt discharged via wheelchair in satisfactory condition accompanied by family member

## 2019-11-13 LAB — PROTEIN ELECTROPHORESIS, SERUM
A/G Ratio: 1 (ref 0.7–1.7)
Albumin ELP: 3.2 g/dL (ref 2.9–4.4)
Alpha-1-Globulin: 0.3 g/dL (ref 0.0–0.4)
Alpha-2-Globulin: 0.6 g/dL (ref 0.4–1.0)
Beta Globulin: 0.9 g/dL (ref 0.7–1.3)
Gamma Globulin: 1.5 g/dL (ref 0.4–1.8)
Globulin, Total: 3.3 g/dL (ref 2.2–3.9)
M-Spike, %: 1.3 g/dL — ABNORMAL HIGH
Total Protein ELP: 6.5 g/dL (ref 6.0–8.5)

## 2019-11-13 LAB — KAPPA/LAMBDA LIGHT CHAINS
Kappa free light chain: 40.2 mg/L — ABNORMAL HIGH (ref 3.3–19.4)
Kappa, lambda light chain ratio: 1.81 — ABNORMAL HIGH (ref 0.26–1.65)
Lambda free light chains: 22.2 mg/L (ref 5.7–26.3)

## 2019-11-14 LAB — IMMUNOFIXATION ELECTROPHORESIS
IgA: 133 mg/dL (ref 64–422)
IgG (Immunoglobin G), Serum: 1633 mg/dL — ABNORMAL HIGH (ref 586–1602)
IgM (Immunoglobulin M), Srm: 12 mg/dL — ABNORMAL LOW (ref 26–217)
Total Protein ELP: 6.8 g/dL (ref 6.0–8.5)

## 2019-11-16 ENCOUNTER — Other Ambulatory Visit (HOSPITAL_COMMUNITY): Payer: Self-pay

## 2019-11-16 MED ORDER — LENALIDOMIDE 15 MG PO CAPS: 15.0000 mg | ORAL_CAPSULE | Freq: Every day | ORAL | 0 refills | Status: DC

## 2019-11-16 NOTE — Telephone Encounter (Signed)
Chart reviewed. Revlimid refilled per Dr. Delton Coombes

## 2019-11-19 ENCOUNTER — Inpatient Hospital Stay (HOSPITAL_COMMUNITY): Payer: Medicare Other

## 2019-11-19 ENCOUNTER — Encounter (HOSPITAL_COMMUNITY): Payer: Self-pay

## 2019-11-19 ENCOUNTER — Other Ambulatory Visit: Payer: Self-pay

## 2019-11-19 VITALS — BP 143/66 | HR 74 | Temp 97.1°F | Resp 18

## 2019-11-19 DIAGNOSIS — Z5112 Encounter for antineoplastic immunotherapy: Secondary | ICD-10-CM | POA: Diagnosis not present

## 2019-11-19 DIAGNOSIS — C9000 Multiple myeloma not having achieved remission: Secondary | ICD-10-CM | POA: Diagnosis not present

## 2019-11-19 LAB — COMPREHENSIVE METABOLIC PANEL
ALT: 17 U/L (ref 0–44)
AST: 16 U/L (ref 15–41)
Albumin: 3.6 g/dL (ref 3.5–5.0)
Alkaline Phosphatase: 70 U/L (ref 38–126)
Anion gap: 8 (ref 5–15)
BUN: 17 mg/dL (ref 8–23)
CO2: 25 mmol/L (ref 22–32)
Calcium: 9 mg/dL (ref 8.9–10.3)
Chloride: 106 mmol/L (ref 98–111)
Creatinine, Ser: 0.88 mg/dL (ref 0.44–1.00)
GFR, Estimated: >60 mL/min (ref >60)
Glucose, Bld: 204 mg/dL — ABNORMAL HIGH (ref 70–99)
Potassium: 3.4 mmol/L — ABNORMAL LOW (ref 3.5–5.1)
Sodium: 139 mmol/L (ref 135–145)
Total Bilirubin: 1 mg/dL (ref 0.3–1.2)
Total Protein: 7.2 g/dL (ref 6.5–8.1)

## 2019-11-19 LAB — CBC WITH DIFFERENTIAL/PLATELET
Abs Immature Granulocytes: 0.07 10*3/uL (ref 0.00–0.07)
Basophils Absolute: 0.1 10*3/uL (ref 0.0–0.1)
Basophils Relative: 1 %
Eosinophils Absolute: 0.4 10*3/uL (ref 0.0–0.5)
Eosinophils Relative: 5 %
HCT: 27.1 % — ABNORMAL LOW (ref 36.0–46.0)
Hemoglobin: 8.1 g/dL — ABNORMAL LOW (ref 12.0–15.0)
Immature Granulocytes: 1 %
Lymphocytes Relative: 41 %
Lymphs Abs: 3.3 10*3/uL (ref 0.7–4.0)
MCH: 27.6 pg (ref 26.0–34.0)
MCHC: 29.9 g/dL — ABNORMAL LOW (ref 30.0–36.0)
MCV: 92.5 fL (ref 80.0–100.0)
Monocytes Absolute: 0.9 10*3/uL (ref 0.1–1.0)
Monocytes Relative: 12 %
Neutro Abs: 3.1 10*3/uL (ref 1.7–7.7)
Neutrophils Relative %: 40 %
Platelets: 272 10*3/uL (ref 150–400)
RBC: 2.93 MIL/uL — ABNORMAL LOW (ref 3.87–5.11)
RDW: 23.7 % — ABNORMAL HIGH (ref 11.5–15.5)
WBC: 7.9 10*3/uL (ref 4.0–10.5)
nRBC: 1.3 % — ABNORMAL HIGH (ref 0.0–0.2)

## 2019-11-19 LAB — LACTATE DEHYDROGENASE: LDH: 164 U/L (ref 98–192)

## 2019-11-19 MED ORDER — DEXAMETHASONE 4 MG PO TABS
ORAL_TABLET | ORAL | Status: AC
  Filled 2019-11-19: qty 5

## 2019-11-19 MED ORDER — BORTEZOMIB CHEMO SQ INJECTION 3.5 MG (2.5MG/ML)
1.3000 mg/m2 | Freq: Once | INTRAMUSCULAR | Status: AC
  Administered 2019-11-19: 2.25 mg via SUBCUTANEOUS
  Filled 2019-11-19: qty 0.9

## 2019-11-19 MED ORDER — DEXAMETHASONE 4 MG PO TABS
20.0000 mg | ORAL_TABLET | Freq: Once | ORAL | Status: AC
  Administered 2019-11-19: 20 mg via ORAL

## 2019-11-19 NOTE — Progress Notes (Signed)
Kim Mills tolerated Velcade injection well without complaints or incident.Labs reviewed prior to administering this medication. VSS Pt continues to take Revlimid as prescribed without issues. Pt discharged via wheelchair in satisfactory condition accompanied by family member

## 2019-11-19 NOTE — Patient Instructions (Signed)
[facility] Discharge Instructions for Patients Receiving Chemotherapy   Beginning January 23rd 2017 lab work for the Cancer Center will be done in the  Main lab at [facility] on 1st floor. If you have a lab appointment with the Cancer Center please come in thru the  Main Entrance and check in at the main information desk   Today you received the following chemotherapy agents Velcade injection. Follow-up as scheduled  To help prevent nausea and vomiting after your treatment, we encourage you to take your nausea medication   If you develop nausea and vomiting, or diarrhea that is not controlled by your medication, call the clinic.  The clinic phone number is [phone]. Office hours are Monday-Friday 8:30am-5:00pm.  BELOW ARE SYMPTOMS THAT SHOULD BE REPORTED IMMEDIATELY:  *FEVER GREATER THAN 101.0 F  *CHILLS WITH OR WITHOUT FEVER  NAUSEA AND VOMITING THAT IS NOT CONTROLLED WITH YOUR NAUSEA MEDICATION  *UNUSUAL SHORTNESS OF BREATH  *UNUSUAL BRUISING OR BLEEDING  TENDERNESS IN MOUTH AND THROAT WITH OR WITHOUT PRESENCE OF ULCERS  *URINARY PROBLEMS  *BOWEL PROBLEMS  UNUSUAL RASH Items with * indicate a potential emergency and should be followed up as soon as possible. If you have an emergency after office hours please contact your primary care physician or go to the nearest emergency department.  Please call the clinic during office hours if you have any questions or concerns.   You may also contact the Patient Navigator at [phone] should you have any questions or need assistance in obtaining follow up care.      Resources For Cancer Patients and their Caregivers ? American Cancer Society: Can assist with transportation, wigs, general needs, runs Look Good Feel Better.        1-888-227-6333 ? Cancer Care: Provides financial assistance, online support groups, medication/co-pay assistance.  1-800-813-HOPE (4673) ? [facility] Assists [place] cancer patients and their families through emotional , educational and financial support.  336-427-4357 ? [place] DSS Where to apply for food stamps, Medicaid and utility assistance. 336-342-1394 ? [facility]: Transportation to medical appointments. 336-347-2287 ? Social Security Administration: May apply for disability if have a Stage IV cancer. 336-342-7796 1-800-772-1213 ? [place] Aging, Disability and Transit Services: Assists with nutrition, care and transit needs. 336-349-2343         

## 2019-11-20 LAB — PROTEIN ELECTROPHORESIS, SERUM
A/G Ratio: 1 (ref 0.7–1.7)
Albumin ELP: 3.4 g/dL (ref 2.9–4.4)
Alpha-1-Globulin: 0.3 g/dL (ref 0.0–0.4)
Alpha-2-Globulin: 0.7 g/dL (ref 0.4–1.0)
Beta Globulin: 1 g/dL (ref 0.7–1.3)
Gamma Globulin: 1.3 g/dL (ref 0.4–1.8)
Globulin, Total: 3.3 g/dL (ref 2.2–3.9)
M-Spike, %: 1.1 g/dL — ABNORMAL HIGH
Total Protein ELP: 6.7 g/dL (ref 6.0–8.5)

## 2019-11-20 LAB — KAPPA/LAMBDA LIGHT CHAINS
Kappa free light chain: 32.5 mg/L — ABNORMAL HIGH (ref 3.3–19.4)
Kappa, lambda light chain ratio: 1.56 (ref 0.26–1.65)
Lambda free light chains: 20.9 mg/L (ref 5.7–26.3)

## 2019-11-22 LAB — IMMUNOFIXATION ELECTROPHORESIS
IgA: 151 mg/dL (ref 64–422)
IgG (Immunoglobin G), Serum: 1314 mg/dL (ref 586–1602)
IgM (Immunoglobulin M), Srm: 13 mg/dL — ABNORMAL LOW (ref 26–217)
Total Protein ELP: 6.6 g/dL (ref 6.0–8.5)

## 2019-11-23 ENCOUNTER — Telehealth: Payer: Self-pay | Admitting: Pharmacist

## 2019-11-23 NOTE — Progress Notes (Signed)
Chronic Care Management Pharmacy Assistant   Name: Kim Mills  MRN: [id number] DOB: 09/21/1940  Reason for Encounter: Initial Questions   PCP : Alycia Rossetti, MD  Allergies:  No Known Allergies  Medications: Outpatient Encounter Medications as of 11/23/2019  Medication Sig  . acyclovir (ZOVIRAX) 400 MG tablet Take 1 tablet (400 mg total) by mouth 2 (two) times daily.  Marland Kitchen amLODipine (NORVASC) 10 MG tablet Take 1 tablet (10 mg total) by mouth daily.  . ASPIRIN 81 PO Take 1 tablet by mouth daily.  . Blood Glucose Monitoring Suppl (BLOOD GLUCOSE SYSTEM PAK) KIT Please dispense based on patient and insurance preference. Use as directed to monitor FSBS 2x daily. Dx: E11.9.  Marland Kitchen cyanocobalamin (,VITAMIN B-12,) 1000 MCG/ML injection Inject 1 mL (1,000 mcg total) into the muscle every 30 (thirty) days.  Marland Kitchen gabapentin (NEURONTIN) 300 MG capsule Take 1 capsule (300 mg total) by mouth at bedtime.  . Garlic (GARLIQUE) 938 MG TBEC Take 400 mg by mouth every evening.   Marland Kitchen glipiZIDE (GLUCOTROL) 5 MG tablet Take 1 tablet (5 mg total) by mouth 2 (two) times daily.  . Glucose Blood (BLOOD GLUCOSE TEST STRIPS) STRP Please dispense based on patient and insurance preference. Use as directed to monitor FSBS 2x daily. Dx: E11.9.  . HM LORATADINE 10 MG tablet TAKE ONE TABLET (10MG TOTAL) BY MOUTH DAILY  . hydrochlorothiazide (MICROZIDE) 12.5 MG capsule TAKE ONE CAPSULE BY MOUTH DAILY (Patient taking differently: Take 12.5 mg by mouth daily. )  . HYDROcodone-acetaminophen (NORCO/VICODIN) 5-325 MG tablet Take 1 tablet by mouth every 6 (six) hours as needed for moderate pain.  . Lancets MISC Please dispense based on patient and insurance preference. Use as directed to monitor FSBS 2x daily. Dx: E11.9.  . lenalidomide (REVLIMID) 15 MG capsule Take 1 capsule (15 mg total) by mouth daily.  . meclizine (ANTIVERT) 25 MG tablet Take 25 mg by mouth every 6 (six) hours as needed for dizziness. (Patient not  taking: Reported on 10/29/2019)  . metFORMIN (GLUCOPHAGE) 1000 MG tablet TAKE ONE TABLET BY MOUTH TWICE A DAY  . metoprolol tartrate (LOPRESSOR) 25 MG tablet Take 0.5 tablets (12.5 mg total) by mouth 2 (two) times daily.  . Omega-3 Fatty Acids (OMEGA-3 FISH OIL PO) Take 1 capsule by mouth every evening.   Marland Kitchen omeprazole (PRILOSEC) 40 MG capsule TAKE ONE CAPSULE BY MOUTH DAILY (Patient taking differently: Take 40 mg by mouth daily. )  . prochlorperazine (COMPAZINE) 10 MG tablet Take 1 tablet (10 mg total) by mouth every 6 (six) hours as needed (Nausea or vomiting).  . simvastatin (ZOCOR) 40 MG tablet TAKE ONE TABLET (40MG TOTAL) BY MOUTH BEDTIME (Patient taking differently: Take 40 mg by mouth at bedtime. )  . SYNTHROID 150 MCG tablet Take 150 mcg by mouth daily.  Marland Kitchen tolterodine (DETROL LA) 4 MG 24 hr capsule TAKE ONE CAPSULE BY MOUTH DAILY (Patient taking differently: Take 4 mg by mouth daily. )  . traMADol (ULTRAM) 50 MG tablet Take 1 tablet (50 mg total) by mouth every 6 (six) hours as needed.   No facility-administered encounter medications on file as of 11/23/2019.    Current Diagnosis: Patient Active Problem List   Diagnosis Date Noted  . Multiple myeloma ([place]) 10/13/2019  . Goals of care, counseling/discussion 10/13/2019  . Multiple myeloma not having achieved remission ([place]) 10/13/2019  . Monoclonal gammopathy 09/12/2019  . Gross hematuria 07/02/2019  . CKD (chronic kidney disease) stage 3, GFR 30-59 ml/min (  [place]) 04/18/2019  . Vitamin D deficiency 04/17/2019  . B12 deficiency 04/12/2018  . Normocytic anemia 04/12/2018  . Carotid artery disease without cerebral infarction ([place]) 04/12/2018  . Gait disorder 01/11/2018  . Diabetic neuropathy ([place]) 01/10/2018  . Bilateral carotid bruits 11/28/2017  . CAD (coronary artery disease) 11/23/2017  . Elevated troponin   . SVT (supraventricular tachycardia) ([place]) 10/05/2017  . History of colonic polyps   . Encounter for screening colonoscopy  03/13/2014  . Constipation 03/13/2014  . GERD (gastroesophageal reflux disease) 03/13/2014  . Hyperlipidemia   . Type 2 diabetes mellitus ([place])   . OA (osteoarthritis) of knee   . Hypothyroidism   . Chest pain   . Obesity   . Hypertension     Goals Addressed   None     Chronic Care Management   Outreach Note  11/26/2019 Name: Kim Mills MRN: [id number] DOB: 1940-02-09  Referred by: Alycia Rossetti, MD Reason for referral : Chronic Care Management   Third unsuccessful telephone outreach was attempted today. The patient was referred to the pharmacist for assistance with care management and care coordination.  Unable to make contact with the patient prior to the initial visit with the clinical pharmacist on Monday October 18th at 3:30 pm   Follow-Up:  Pharmacist [place], [place] Pharmacist Assistant [phone]

## 2019-11-26 ENCOUNTER — Inpatient Hospital Stay (HOSPITAL_COMMUNITY): Payer: Medicare Other

## 2019-11-26 ENCOUNTER — Ambulatory Visit: Payer: Medicare Other | Admitting: Pharmacist

## 2019-11-26 ENCOUNTER — Other Ambulatory Visit: Payer: Self-pay

## 2019-11-26 ENCOUNTER — Inpatient Hospital Stay (HOSPITAL_COMMUNITY): Payer: Medicare Other | Attending: Hematology | Admitting: Oncology

## 2019-11-26 VITALS — BP 140/48 | HR 71 | Resp 16 | Wt 181.4 lb

## 2019-11-26 DIAGNOSIS — C9000 Multiple myeloma not having achieved remission: Secondary | ICD-10-CM | POA: Diagnosis not present

## 2019-11-26 DIAGNOSIS — I10 Essential (primary) hypertension: Secondary | ICD-10-CM

## 2019-11-26 DIAGNOSIS — R1310 Dysphagia, unspecified: Secondary | ICD-10-CM | POA: Diagnosis not present

## 2019-11-26 DIAGNOSIS — E039 Hypothyroidism, unspecified: Secondary | ICD-10-CM

## 2019-11-26 DIAGNOSIS — E1143 Type 2 diabetes mellitus with diabetic autonomic (poly)neuropathy: Secondary | ICD-10-CM

## 2019-11-26 DIAGNOSIS — Z5112 Encounter for antineoplastic immunotherapy: Secondary | ICD-10-CM | POA: Diagnosis not present

## 2019-11-26 DIAGNOSIS — E782 Mixed hyperlipidemia: Secondary | ICD-10-CM

## 2019-11-26 LAB — CBC WITH DIFFERENTIAL/PLATELET
Abs Immature Granulocytes: 0.16 10*3/uL — ABNORMAL HIGH (ref 0.00–0.07)
Band Neutrophils: 0 %
Basophils Absolute: 0 10*3/uL (ref 0.0–0.1)
Basophils Relative: 0 %
Blasts: 3 %
Eosinophils Absolute: 0.5 10*3/uL (ref 0.0–0.5)
Eosinophils Relative: 5 %
HCT: 27.4 % — ABNORMAL LOW (ref 36.0–46.0)
Hemoglobin: 8.5 g/dL — ABNORMAL LOW (ref 12.0–15.0)
Immature Granulocytes: 2 %
Lymphocytes Relative: 23 %
Lymphs Abs: 2.3 10*3/uL (ref 0.7–4.0)
MCH: 28.6 pg (ref 26.0–34.0)
MCHC: 31 g/dL (ref 30.0–36.0)
MCV: 92.3 fL (ref 80.0–100.0)
Metamyelocytes Relative: 2 %
Monocytes Absolute: 0.4 10*3/uL (ref 0.1–1.0)
Monocytes Relative: 4 %
Myelocytes: 0 %
Neutro Abs: 6.4 10*3/uL (ref 1.7–7.7)
Neutrophils Relative %: 61 %
Platelets: 243 10*3/uL (ref 150–400)
Promyelocytes Relative: 2 %
RBC: 2.97 MIL/uL — ABNORMAL LOW (ref 3.87–5.11)
RDW: 23.4 % — ABNORMAL HIGH (ref 11.5–15.5)
WBC: 9.9 10*3/uL (ref 4.0–10.5)
nRBC: 0 /100 WBC
nRBC: 1.8 % — ABNORMAL HIGH (ref 0.0–0.2)

## 2019-11-26 LAB — COMPREHENSIVE METABOLIC PANEL
ALT: 10 U/L (ref 0–44)
AST: 12 U/L — ABNORMAL LOW (ref 15–41)
Albumin: 3.5 g/dL (ref 3.5–5.0)
Alkaline Phosphatase: 72 U/L (ref 38–126)
Anion gap: 11 (ref 5–15)
BUN: 17 mg/dL (ref 8–23)
CO2: 27 mmol/L (ref 22–32)
Calcium: 8.8 mg/dL — ABNORMAL LOW (ref 8.9–10.3)
Chloride: 100 mmol/L (ref 98–111)
Creatinine, Ser: 0.95 mg/dL (ref 0.44–1.00)
GFR, Estimated: 58 mL/min — ABNORMAL LOW (ref >60)
Glucose, Bld: 233 mg/dL — ABNORMAL HIGH (ref 70–99)
Potassium: 3.7 mmol/L (ref 3.5–5.1)
Sodium: 138 mmol/L (ref 135–145)
Total Bilirubin: 0.7 mg/dL (ref 0.3–1.2)
Total Protein: 6.6 g/dL (ref 6.5–8.1)

## 2019-11-26 MED ORDER — DEXAMETHASONE 4 MG PO TABS
20.0000 mg | ORAL_TABLET | Freq: Once | ORAL | Status: AC
  Administered 2019-11-26: 20 mg via ORAL
  Filled 2019-11-26: qty 5

## 2019-11-26 MED ORDER — BORTEZOMIB CHEMO SQ INJECTION 3.5 MG (2.5MG/ML)
1.3000 mg/m2 | Freq: Once | INTRAMUSCULAR | Status: AC
  Administered 2019-11-26: 2.25 mg via SUBCUTANEOUS
  Filled 2019-11-26: qty 0.9

## 2019-11-26 NOTE — Patient Instructions (Addendum)
Visit Information Thank you for meeting with me today!  I look forward to working with you to help you meet all of your healthcare goals and answer any questions you may have.  Feel free to contact me anytime!  Goals Addressed            This Visit's Progress   . Pharmacy care Plan:       CARE PLAN ENTRY (see longitudinal plan of care for additional care plan information)  Current Barriers:  . Chronic Disease Management support, education, and care coordination needs related to Hypertension, Hyperlipidemia, Diabetes, and Hypothyroidism   Hypertension BP Readings from Last 3 Encounters:  11/26/19 (!) 140/48  11/19/19 (!) 143/66  11/12/19 (!) 152/52   . Pharmacist Clinical Goal(s): o Over the next 180 days, patient will work with PharmD and providers to achieve BP goal <140/90 . Current regimen:  . Amlodipine 10mg  . HCTZ 12.5mg  daily . Metoprolol tartrate 25mg  . Interventions: o Reviewed home blood pressure monitoring o Discussed last few office BP readings . Patient self care activities - Over the next 180 days, patient will: o Check BP at appointments, document, and provide at future appointments o Ensure daily salt intake < 2300 mg/day  Hyperlipidemia Lab Results  Component Value Date/Time   LDLCALC 97 04/13/2019 12:41 PM   . Pharmacist Clinical Goal(s): o Over the next 180 days, patient will work with PharmD and providers to maintain LDL goal < 100 . Current regimen:  o Simvastatin 40mg  . Interventions: o Reviewed most recent lipid panel o Discussed medication adherence . Patient self care activities - Over the next 180 days, patient will: o Continue to focus on medication adherence by pill box  Diabetes Lab Results  Component Value Date/Time   HGBA1C 6.9 (H) 08/17/2019 03:47 PM   HGBA1C 7.7 (H) 04/13/2019 12:41 PM   . Pharmacist Clinical Goal(s): o Over the next 180 days, patient will work with PharmD and providers to maintain A1c goal <7% . Current  regimen:  Marland Kitchen Glipizide 5mg  twice daily . Metformin 1000mg  twice daily . Interventions: o Reviewed home sugar monitoring o Discussed importance of regular meals to avoid hypoglycemia . Patient self care activities - Over the next 180 days, patient will: o Check blood sugar once daily, document, and provide at future appointments o Contact provider with any episodes of hypoglycemia  Hypothyroidism . Pharmacist Clinical Goal(s) o Over the next 180 days, patient will work with PharmD and providers to optimize medication and minimize symptoms of hypothyroidism. . Current regimen:  o Synthroid 169mcg . Interventions: o Reviewed most recent TSH o Discussed signs of hypothyroidism . Patient self care activities - Over the next 180 days, patient will: o Continue to focus on medication adherence by using her pill box  Initial goal documentation        Kim Mills was given information about Chronic Care Management services today including:  1. CCM service includes personalized support from designated clinical staff supervised by her physician, including individualized plan of care and coordination with other care providers 2. 24/7 contact phone numbers for assistance for urgent and routine care needs. 3. Standard insurance, coinsurance, copays and deductibles apply for chronic care management only during months in which we provide at least 20 minutes of these services. Most insurances cover these services at 100%, however patients may be responsible for any copay, coinsurance and/or deductible if applicable. This service may help you avoid the need for more expensive face-to-face services. 4. Only one practitioner  may furnish and bill the service in a calendar month. 5. The patient may stop CCM services at any time (effective at the end of the month) by phone call to the office staff.  Patient agreed to services and verbal consent obtained.   The patient verbalized understanding of  instructions provided today and agreed to receive a mailed copy of patient instruction and/or educational materials. Telephone follow up appointment with pharmacy team member scheduled for: 6 months  Beverly Milch, PharmD Clinical Pharmacist [facility] [phone]   Hypertension, Adult High blood pressure (hypertension) is when the force of blood pumping through the arteries is too strong. The arteries are the blood vessels that carry blood from the heart throughout the body. Hypertension forces the heart to work harder to pump blood and may cause arteries to become narrow or stiff. Untreated or uncontrolled hypertension can cause a heart attack, heart failure, a stroke, kidney disease, and other problems. A blood pressure reading consists of a higher number over a lower number. Ideally, your blood pressure should be below 120/80. The first ("top") number is called the systolic pressure. It is a measure of the pressure in your arteries as your heart beats. The second ("bottom") number is called the diastolic pressure. It is a measure of the pressure in your arteries as the heart relaxes. What are the causes? The exact cause of this condition is not known. There are some conditions that result in or are related to high blood pressure. What increases the risk? Some risk factors for high blood pressure are under your control. The following factors may make you more likely to develop this condition:  Smoking.  Having type 2 diabetes mellitus, high cholesterol, or both.  Not getting enough exercise or physical activity.  Being overweight.  Having too much fat, sugar, calories, or salt (sodium) in your diet.  Drinking too much alcohol. Some risk factors for high blood pressure may be difficult or impossible to change. Some of these factors include:  Having chronic kidney disease.  Having a family history of high blood pressure.  Age. Risk increases with  age.  Race. You may be at higher risk if you are African American.  Gender. Men are at higher risk than women before age 15. After age 43, women are at higher risk than men.  Having obstructive sleep apnea.  Stress. What are the signs or symptoms? High blood pressure may not cause symptoms. Very high blood pressure (hypertensive crisis) may cause:  Headache.  Anxiety.  Shortness of breath.  Nosebleed.  Nausea and vomiting.  Vision changes.  Severe chest pain.  Seizures. How is this diagnosed? This condition is diagnosed by measuring your blood pressure while you are seated, with your arm resting on a flat surface, your legs uncrossed, and your feet flat on the floor. The cuff of the blood pressure monitor will be placed directly against the skin of your upper arm at the level of your heart. It should be measured at least twice using the same arm. Certain conditions can cause a difference in blood pressure between your right and left arms. Certain factors can cause blood pressure readings to be lower or higher than normal for a short period of time:  When your blood pressure is higher when you are in a health care provider's office than when you are at home, this is called white coat hypertension. Most people with this condition do not need medicines.  When your blood pressure is  higher at home than when you are in a health care provider's office, this is called masked hypertension. Most people with this condition may need medicines to control blood pressure. If you have a high blood pressure reading during one visit or you have normal blood pressure with other risk factors, you may be asked to:  Return on a different day to have your blood pressure checked again.  Monitor your blood pressure at home for 1 week or longer. If you are diagnosed with hypertension, you may have other blood or imaging tests to help your health care provider understand your overall risk for other  conditions. How is this treated? This condition is treated by making healthy lifestyle changes, such as eating healthy foods, exercising more, and reducing your alcohol intake. Your health care provider may prescribe medicine if lifestyle changes are not enough to get your blood pressure under control, and if:  Your systolic blood pressure is above 130.  Your diastolic blood pressure is above 80. Your personal target blood pressure may vary depending on your medical conditions, your age, and other factors. Follow these instructions at home: Eating and drinking   Eat a diet that is high in fiber and potassium, and low in sodium, added sugar, and fat. An example eating plan is called the DASH (Dietary Approaches to Stop Hypertension) diet. To eat this way: ? Eat plenty of fresh fruits and vegetables. Try to fill one half of your plate at each meal with fruits and vegetables. ? Eat whole grains, such as whole-wheat pasta, brown rice, or whole-grain bread. Fill about one fourth of your plate with whole grains. ? Eat or drink low-fat dairy products, such as skim milk or low-fat yogurt. ? Avoid fatty cuts of meat, processed or cured meats, and poultry with skin. Fill about one fourth of your plate with lean proteins, such as fish, chicken without skin, beans, eggs, or tofu. ? Avoid pre-made and processed foods. These tend to be higher in sodium, added sugar, and fat.  Reduce your daily sodium intake. Most people with hypertension should eat less than 1,500 mg of sodium a day.  Do not drink alcohol if: ? Your health care provider tells you not to drink. ? You are pregnant, may be pregnant, or are planning to become pregnant.  If you drink alcohol: ? Limit how much you use to:  0-1 drink a day for women.  0-2 drinks a day for men. ? Be aware of how much alcohol is in your drink. In the [place] one drink equals one 12 oz bottle of beer (355 mL), one 5 oz glass of wine (148 mL), or one 1 oz glass  of hard liquor (44 mL). Lifestyle   Work with your health care provider to maintain a healthy body weight or to lose weight. Ask what an ideal weight is for you.  Get at least 30 minutes of exercise most days of the week. Activities may include walking, swimming, or biking.  Include exercise to strengthen your muscles (resistance exercise), such as Pilates or lifting weights, as part of your weekly exercise routine. Try to do these types of exercises for 30 minutes at least 3 days a week.  Do not use any products that contain nicotine or tobacco, such as cigarettes, e-cigarettes, and chewing tobacco. If you need help quitting, ask your health care provider.  Monitor your blood pressure at home as told by your health care provider.  Keep all follow-up visits as told by  your health care provider. This is important. Medicines  Take over-the-counter and prescription medicines only as told by your health care provider. Follow directions carefully. Blood pressure medicines must be taken as prescribed.  Do not skip doses of blood pressure medicine. Doing this puts you at risk for problems and can make the medicine less effective.  Ask your health care provider about side effects or reactions to medicines that you should watch for. Contact a health care provider if you:  Think you are having a reaction to a medicine you are taking.  Have headaches that keep coming back (recurring).  Feel dizzy.  Have swelling in your ankles.  Have trouble with your vision. Get help right away if you:  Develop a severe headache or confusion.  Have unusual weakness or numbness.  Feel faint.  Have severe pain in your chest or abdomen.  Vomit repeatedly.  Have trouble breathing. Summary  Hypertension is when the force of blood pumping through your arteries is too strong. If this condition is not controlled, it may put you at risk for serious complications.  Your personal target blood pressure  may vary depending on your medical conditions, your age, and other factors. For most people, a normal blood pressure is less than 120/80.  Hypertension is treated with lifestyle changes, medicines, or a combination of both. Lifestyle changes include losing weight, eating a healthy, low-sodium diet, exercising more, and limiting alcohol. This information is not intended to replace advice given to you by your health care provider. Make sure you discuss any questions you have with your health care provider. Document Revised: 10/05/2017 Document Reviewed: 10/05/2017 Elsevier Patient Education  2020 [employer].

## 2019-11-26 NOTE — Patient Instructions (Signed)
[facility] Discharge Instructions for Patients Receiving Chemotherapy   Beginning January 23rd 2017 lab work for the [facility] will be done in the  Main lab at [facility] on 1st floor. If you have a lab appointment with the [facility] please come in thru the  Main Entrance and check in at the main information desk   Today you received the following chemotherapy agents Velcade  To help prevent nausea and vomiting after your treatment, we encourage you to take your nausea medication   If you develop nausea and vomiting, or diarrhea that is not controlled by your medication, call the clinic.  The clinic phone number is [phone]. Office hours are Monday-Friday 8:30am-5:00pm.  BELOW ARE SYMPTOMS THAT SHOULD BE REPORTED IMMEDIATELY:  *FEVER GREATER THAN 101.0 F  *CHILLS WITH OR WITHOUT FEVER  NAUSEA AND VOMITING THAT IS NOT CONTROLLED WITH YOUR NAUSEA MEDICATION  *UNUSUAL SHORTNESS OF BREATH  *UNUSUAL BRUISING OR BLEEDING  TENDERNESS IN MOUTH AND THROAT WITH OR WITHOUT PRESENCE OF ULCERS  *URINARY PROBLEMS  *BOWEL PROBLEMS  UNUSUAL RASH Items with * indicate a potential emergency and should be followed up as soon as possible. If you have an emergency after office hours please contact your primary care physician or go to the nearest emergency department.  Please call the clinic during office hours if you have any questions or concerns.   You may also contact the Patient Navigator at [phone] should you have any questions or need assistance in obtaining follow up care.      Resources For Cancer Patients and their Caregivers ? American Cancer Society: Can assist with transportation, wigs, general needs, runs Look Good Feel Better.        860-239-2392 ? Cancer Care: Provides financial assistance, online support groups, medication/co-pay assistance.  1-800-813-HOPE 424-849-0958) ? [facility] Assists [place] cancer  patients and their families through emotional , educational and financial support.  515-030-9571 ? [place] DSS Where to apply for food stamps, Medicaid and utility assistance. 818-507-7621 ? [facility]: Transportation to medical appointments. 380-768-5242 ? Social Security Administration: May apply for disability if have a Stage IV cancer. 660-076-2228 (401)792-2990 ? [employer], Disability and Transit Services: Assists with nutrition, care and transit needs. [phone]

## 2019-11-26 NOTE — Progress Notes (Signed)
Kim Mills presents today for D8C2 Velcade. Pt denies any new changes or symptoms since last treatment. She is starting to develop some discoloration where she received her last Velcade injection into her LLQ. She states it is tender put not painful. I told her that this is normal and that it may even begin to peel and/or itch and to use lotion or a preferred moisturizer to help. Lab results and vitals have been reviewed and are stable and within parameters for treatment. Patient has been assessed by Faythe Casa, NP, who has approved proceeding with treatment today as planned.  Injection tolerated without incident or complaint. Site clean and dry, bandaid applied. Discharged in satisfactory condition with follow up instructions.

## 2019-11-26 NOTE — Chronic Care Management (AMB) (Signed)
Chronic Care Management Pharmacy  Name: Kim Mills  MRN: [id number] DOB: 02/16/1940   Chief Complaint/ HPI  Kim Mills,  79 y.o. , female presents for their Initial CCM visit with the clinical pharmacist via telephone.  PCP : Alycia Rossetti, MD  Their chronic conditions include: HTN, CAD, GERD, Type II DM w/ neuropathy, Hypothyroidism, HLD.  Office Visits: 08/17/2019 [facility]) -   She is coughing up mucous in the middle of the night, has not had COVID testing yet  She has OA in her knees and not very mobile, on tramadol prn  Mentions trouble with her bowel, significant stool in colon on CT  They have used colace a few times this week, has been upwards of a week with no bowel movement  She has had mood changes and gets irritable very easy, relies on her faith when she does get depressed  Last visit d/c losartan and started amlodipine for BP due to CKD  Consult Visit: none noted  Medications: Outpatient Encounter Medications as of 11/26/2019  Medication Sig  . acyclovir (ZOVIRAX) 400 MG tablet Take 1 tablet (400 mg total) by mouth 2 (two) times daily.  Marland Kitchen amLODipine (NORVASC) 10 MG tablet Take 1 tablet (10 mg total) by mouth daily.  . ASPIRIN 81 PO Take 1 tablet by mouth daily.  Marland Kitchen gabapentin (NEURONTIN) 300 MG capsule Take 1 capsule (300 mg total) by mouth at bedtime.  . Garlic (GARLIQUE) 782 MG TBEC Take 400 mg by mouth every evening.   Marland Kitchen glipiZIDE (GLUCOTROL) 5 MG tablet Take 1 tablet (5 mg total) by mouth 2 (two) times daily.  Marland Kitchen HM LORATADINE 10 MG tablet TAKE ONE TABLET (10MG TOTAL) BY MOUTH DAILY  . hydrochlorothiazide (MICROZIDE) 12.5 MG capsule TAKE ONE CAPSULE BY MOUTH DAILY (Patient taking differently: Take 12.5 mg by mouth daily. )  . HYDROcodone-acetaminophen (NORCO/VICODIN) 5-325 MG tablet Take 1 tablet by mouth every 6 (six) hours as needed for moderate pain.  . meclizine (ANTIVERT) 25 MG tablet Take 25 mg by mouth every 6 (six) hours as needed  for dizziness.   . metFORMIN (GLUCOPHAGE) 1000 MG tablet TAKE ONE TABLET BY MOUTH TWICE A DAY  . metoprolol tartrate (LOPRESSOR) 25 MG tablet Take 0.5 tablets (12.5 mg total) by mouth 2 (two) times daily.  . Omega-3 Fatty Acids (OMEGA-3 FISH OIL PO) Take 1 capsule by mouth every evening.   Marland Kitchen omeprazole (PRILOSEC) 40 MG capsule TAKE ONE CAPSULE BY MOUTH DAILY (Patient taking differently: Take 40 mg by mouth daily. )  . simvastatin (ZOCOR) 40 MG tablet TAKE ONE TABLET (40MG TOTAL) BY MOUTH BEDTIME (Patient taking differently: Take 40 mg by mouth at bedtime. )  . SYNTHROID 150 MCG tablet Take 150 mcg by mouth daily.  Marland Kitchen tolterodine (DETROL LA) 4 MG 24 hr capsule TAKE ONE CAPSULE BY MOUTH DAILY (Patient taking differently: Take 4 mg by mouth daily. )  . traMADol (ULTRAM) 50 MG tablet Take 1 tablet (50 mg total) by mouth every 6 (six) hours as needed.  . Blood Glucose Monitoring Suppl (BLOOD GLUCOSE SYSTEM PAK) KIT Please dispense based on patient and insurance preference. Use as directed to monitor FSBS 2x daily. Dx: E11.9.  Marland Kitchen cyanocobalamin (,VITAMIN B-12,) 1000 MCG/ML injection Inject 1 mL (1,000 mcg total) into the muscle every 30 (thirty) days. (Patient not taking: Reported on 11/26/2019)  . Glucose Blood (BLOOD GLUCOSE TEST STRIPS) STRP Please dispense based on patient and insurance preference. Use as directed to monitor FSBS  2x daily. Dx: E11.9.  . Lancets MISC Please dispense based on patient and insurance preference. Use as directed to monitor FSBS 2x daily. Dx: E11.9.  . lenalidomide (REVLIMID) 15 MG capsule Take 1 capsule (15 mg total) by mouth daily.  . prochlorperazine (COMPAZINE) 10 MG tablet Take 1 tablet (10 mg total) by mouth every 6 (six) hours as needed (Nausea or vomiting).   No facility-administered encounter medications on file as of 11/26/2019.     Current Diagnosis/Assessment:  Goals Addressed            This Visit's Progress   . Pharmacy care Plan:       CARE PLAN  ENTRY (see longitudinal plan of care for additional care plan information)  Current Barriers:  . Chronic Disease Management support, education, and care coordination needs related to Hypertension, Hyperlipidemia, Diabetes, and Hypothyroidism   Hypertension BP Readings from Last 3 Encounters:  11/26/19 (!) 140/48  11/19/19 (!) 143/66  11/12/19 (!) 152/52   . Pharmacist Clinical Goal(s): o Over the next 180 days, patient will work with PharmD and providers to achieve BP goal <140/90 . Current regimen:  . Amlodipine 42m . HCTZ 12.568mdaily . Metoprolol tartrate 2547m Interventions: o Reviewed home blood pressure monitoring o Discussed last few office BP readings . Patient self care activities - Over the next 180 days, patient will: o Check BP at appointments, document, and provide at future appointments o Ensure daily salt intake < 2300 mg/day  Hyperlipidemia Lab Results  Component Value Date/Time   LDLCALC 97 04/13/2019 12:41 PM   . Pharmacist Clinical Goal(s): o Over the next 180 days, patient will work with PharmD and providers to maintain LDL goal < 100 . Current regimen:  o Simvastatin 7m6mInterventions: o Reviewed most recent lipid panel o Discussed medication adherence . Patient self care activities - Over the next 180 days, patient will: o Continue to focus on medication adherence by pill box  Diabetes Lab Results  Component Value Date/Time   HGBA1C 6.9 (H) 08/17/2019 03:47 PM   HGBA1C 7.7 (H) 04/13/2019 12:41 PM   . Pharmacist Clinical Goal(s): o Over the next 180 days, patient will work with PharmD and providers to maintain A1c goal <7% . Current regimen:  . GlMarland Kitchenpizide 5mg 45mce daily . Metformin 1000mg 44me daily . Interventions: o Reviewed home sugar monitoring o Discussed importance of regular meals to avoid hypoglycemia . Patient self care activities - Over the next 180 days, patient will: o Check blood sugar once daily, document, and provide at  future appointments o Contact provider with any episodes of hypoglycemia  Hypothyroidism . Pharmacist Clinical Goal(s) o Over the next 180 days, patient will work with PharmD and providers to optimize medication and minimize symptoms of hypothyroidism. . Current regimen:  o Synthroid 150mcg 73mterventions: o Reviewed most recent TSH o Discussed signs of hypothyroidism . Patient self care activities - Over the next 180 days, patient will: o Continue to focus on medication adherence by using her pill box  Initial goal documentation        Diabetes   A1c goal <7%  Recent Relevant Labs: Lab Results  Component Value Date/Time   HGBA1C 6.9 (H) 08/17/2019 03:47 PM   HGBA1C 7.7 (H) 04/13/2019 12:41 PM    Last diabetic Eye exam: No results found for: HMDIABEYEEXA  Last diabetic Foot exam: No results found for: HMDIABFOOTEX   Checking BG: Daily  Recent FBG Readings: 139 this am only reading she had  on hand  Patient has failed these meds in past: none noted Patient is currently controlled on the following medications: Marland Kitchen Glipizide 69m twice daily . Metformin 10039mtwice daily  We discussed:   She denies any signs, symptoms of hypoglycemia, no blood sugar readings < 70  Counseled on importance of regular meals and eating with doses of medications, especially glipizide  A1c WNL  Plan  Continue current medications Hypertension   BP goal is:  <140/90  Office blood pressures are  BP Readings from Last 3 Encounters:  11/26/19 (!) 140/48  11/19/19 (!) 143/66  11/12/19 (!) 152/52   Patient checks BP at home infrequently Patient home BP readings are ranging: none noted  Patient has failed these meds in the past: none noted Patient is currently uncontrolled on the following medications:  . Amlodipine 1051m HCTZ 12.5mg30mily . Metoprolol tartrate 25mg31m discussed   Has BP checked at chemo, slightly elevated the past few visits  She denies dizziness,  headaches  Borderline, but acceptable, no med changes warranted at this time  Plan  Continue current medications     Hyperlipidemia   LDL goal < 100  Lipid Panel     Component Value Date/Time   CHOL 162 04/13/2019 1241   TRIG 157 (H) 04/13/2019 1241   HDL 40 (L) 04/13/2019 1241   LDLCALC 97 04/13/2019 1241    Hepatic Function Latest Ref Rng & Units 11/26/2019 11/19/2019 11/12/2019  Total Protein 6.5 - 8.1 g/dL 6.6 7.2 6.6  Albumin 3.5 - 5.0 g/dL 3.5 3.6 3.3(L)  AST 15 - 41 U/L 12(L) 16 29  ALT 0 - 44 U/L 10 17 36  Alk Phosphatase 38 - 126 U/L 72 70 76  Total Bilirubin 0.3 - 1.2 mg/dL 0.7 1.0 0.9     The 10-year ASCVD risk score (GoffMikey Bussingr., et al., 2013) is: 29.2%   Values used to calculate the score:     Age: 84 ye76s     Sex: Female     Is Non-Hispanic African American: Yes     Diabetic: Yes     Tobacco smoker: No     Systolic Blood Pressure: 140 m656     Is BP treated: Yes     HDL Cholesterol: 40 mg/dL     Total Cholesterol: 162 mg/dL   Patient has failed these meds in past: none noted Patient is currently controlled on the following medications:  . Simvastatin 40mg 49mdiscussed:    LDL is WNL  She denies myalgias  Takes medication at appropriate time and reports adherence  Plan  Continue current medications Hypothyroidism   Lab Results  Component Value Date/Time   TSH 0.10 (L) 08/17/2019 03:47 PM   TSH 1.56 04/17/2019 04:10 PM   FREET4 1.6 08/17/2019 03:47 PM   FREET4 1.4 04/17/2019 04:10 PM    Patient has failed these meds in past: none noted Patient is currently uncontrolled based on last TSH on the following medications:  . Synthroid 150mcg 80m discussed:    She does not remember ever switching to the 137 mcg dose and after reviewing her fill history it does not look like it was ever filled.  Based on TSH, she was taking too much and Dr. Chappell Buelah Manissed it to 137  Recommend patient to recheck her TSH to make determination on what  is the proper dose she should be on at the time.  Plan  Continue current medications, recheck TSH and adjust  accordingly  GERD   Patient has failed these meds in past: none noted Patient is currently controlled on the following medications:   Omeprazole 73m We discussed:    Symptoms controlled, takes appropriately  She is working on trigger foods  Plan  Continue current medications CAD   Patient has failed these meds in past: none noted Patient is currently controlled on the following medications:  . ASA 852m We discussed:    Controlling risk factors  Denies abnormal bruising or bleeding  Plan  Continue current medications  Vaccines   Reviewed and discussed patient's vaccination history.    Immunization History  Administered Date(s) Administered  . Zoster Recombinat (Shingrix) 04/12/2018    Plan  Recommended patient receive flu vaccine in office.  Medication Management   . Miscellaneous medications:  o Detrol LA 11m31m Revlimid 65m9mOTC's:  o ASA 81mg10moratadine 10mg 71mtient currently uses ReidsvPrincipal FinancialPatient reports using pill box method to organize medications and promote adherence. . Patient denies missed doses of medication.   ChristBeverly MilchmD Clinical Pharmacist [place] [phone]

## 2019-11-26 NOTE — Progress Notes (Signed)
[place], [place] [address]   CLINIC:  Medical Oncology/Hematology  PCP:  Alycia Rossetti, MD [address] [place] [address] [phone]   REASON FOR VISIT:  Follow-up for normocytic anemia and multiple myeloma  PRIOR THERAPY: None  NGS Results: Not done  CURRENT THERAPY: Bortezomib weekly; Revlimid 2 weeks on, 1 week off  BRIEF ONCOLOGIC HISTORY:  Oncology History  Multiple myeloma not having achieved remission ([place])  10/13/2019 Initial Diagnosis   Multiple myeloma not having achieved remission ([place])   10/29/2019 -  Chemotherapy   The patient had dexamethasone (DECADRON) tablet 20 mg, 20 mg (100 % of original dose 20 mg), Oral,  Once, 2 of 8 cycles Dose modification: 20 mg (original dose 20 mg, Cycle 1) Administration: 20 mg (10/29/2019), 20 mg (11/05/2019), 20 mg (11/12/2019), 20 mg (11/19/2019) bortezomib SQ (VELCADE) chemo injection 2.25 mg, 1.3 mg/m2 = 2.25 mg, Subcutaneous,  Once, 2 of 8 cycles Administration: 2.25 mg (10/29/2019), 2.25 mg (11/05/2019), 2.25 mg (11/12/2019), 2.25 mg (11/19/2019)  for chemotherapy treatment.      CANCER STAGING: Cancer Staging No matching staging information was found for the patient.  INTERVAL HISTORY:  [name], a 79 y.o. female, returns for routine follow-up and consideration for next cycle of chemotherapy. Katiya was last seen on 11/12/2019.   Due for day # 8 of cycle 2 of bortezomib today.   Today she is accompanied by her daughter.  Overall she states she has been doing good.  She has 1 week left of her Revlimid.  She continues to tolerate treatment well.  She has persistent dysphagia and coughing spells with liquids and foods.  Has mild nausea for which she takes Compazine.  Energy levels are about 60%.  Her appetite is 75%.   Overall, she feels ready for velcade today.    REVIEW OF SYSTEMS:  Review of Systems  Constitutional: Positive for appetite change and  fatigue. Negative for fever and unexpected weight change.  HENT:   Positive for trouble swallowing. Negative for nosebleeds and sore throat.   Eyes: Negative.   Respiratory: Positive for shortness of breath. Negative for cough and wheezing.   Cardiovascular: Negative.  Negative for chest pain and leg swelling.  Gastrointestinal: Positive for nausea. Negative for abdominal pain, blood in stool, constipation, diarrhea and vomiting.  Endocrine: Negative.   Genitourinary: Negative.  Negative for bladder incontinence, hematuria and nocturia.   Musculoskeletal: Negative.  Negative for back pain and flank pain.  Skin: Negative.   Neurological: Positive for headaches. Negative for dizziness, light-headedness and numbness.  Hematological: Negative.   Psychiatric/Behavioral: Negative.  Negative for confusion. The patient is not nervous/anxious.     PAST MEDICAL/SURGICAL HISTORY:  Past Medical History:  Diagnosis Date  . Arthritis   . Cataract   . Chest pain    Associated with weakness and fatigue  . Diabetes mellitus    A1c of 7.4 in 08/2010  . GERD (gastroesophageal reflux disease)   . Glaucoma   . Hyperlipidemia    Lipid profile in 08/2010:190, 121, 48, 118; normal CBC and CMet  . Hypertension    Lipid profile in 08/2010:190, 121, 48, 118; normal CBC and CMet  . Hypothyroidism   . Hypothyroidism   . Obesity    Past Surgical History:  Procedure Laterality Date  . CHOLECYSTECTOMY    . COLONOSCOPY N/A 04/01/2014   RMR: Melanosis coli. colonic polyps removed as described above.   Marland Kitchen  CYSTOSCOPY W/ URETERAL STENT PLACEMENT Right 09/13/2019   Procedure: CYSTOSCOPY WITH RIGHT  RETROGRADE PYELOGRAM; BLADDER BIOPSY;  Surgeon: Cleon Gustin, MD;  Location: [facility];  Service: Urology;  Laterality: Right;  . LEFT HEART CATH AND CORONARY ANGIOGRAPHY N/A 10/07/2017   Procedure: LEFT HEART CATH AND CORONARY ANGIOGRAPHY;  Surgeon: Troy Sine, MD;  Location: [facility];  Service:  Cardiovascular;  Laterality: N/A;  . THYROIDECTOMY, PARTIAL      SOCIAL HISTORY:  Social History   Socioeconomic History  . Marital status: Widowed    Spouse name: Not on file  . Number of children: 7  . Years of education: Not on file  . Highest education level: Not on file  Occupational History    Employer: RETIRED  Tobacco Use  . Smoking status: Passive Smoke Exposure - Never Smoker  . Smokeless tobacco: Never Used  Vaping Use  . Vaping Use: Never used  Substance and Sexual Activity  . Alcohol use: Never  . Drug use: Never  . Sexual activity: Not Currently  Other Topics Concern  . Not on file  Social History Narrative  . Not on file   Social Determinants of Health   Financial Resource Strain:   . Difficulty of Paying Living Expenses: Not on file  Food Insecurity:   . Worried About Charity fundraiser in the Last Year: Not on file  . Ran Out of Food in the Last Year: Not on file  Transportation Needs:   . Lack of Transportation (Medical): Not on file  . Lack of Transportation (Non-Medical): Not on file  Physical Activity:   . Days of Exercise per Week: Not on file  . Minutes of Exercise per Session: Not on file  Stress:   . Feeling of Stress : Not on file  Social Connections:   . Frequency of Communication with Friends and Family: Not on file  . Frequency of Social Gatherings with Friends and Family: Not on file  . Attends Religious Services: Not on file  . Active Member of Clubs or Organizations: Not on file  . Attends Archivist Meetings: Not on file  . Marital Status: Not on file  Intimate Partner Violence:   . Fear of Current or Ex-Partner: Not on file  . Emotionally Abused: Not on file  . Physically Abused: Not on file  . Sexually Abused: Not on file    FAMILY HISTORY:  Family History  Problem Relation Age of Onset  . Anemia Father   . Arthritis Mother   . Stroke Brother   . Cancer Sister        unknown kind  . Diabetes Sister   .  Dementia Sister   . Cancer Niece        Breast  . Cancer Brother   . Healthy Son   . Healthy Son   . Healthy Son   . Healthy Daughter   . Healthy Daughter   . Healthy Daughter   . Sarcoidosis Daughter     CURRENT MEDICATIONS:  Current Outpatient Medications  Medication Sig Dispense Refill  . acyclovir (ZOVIRAX) 400 MG tablet Take 1 tablet (400 mg total) by mouth 2 (two) times daily. 60 tablet 6  . amLODipine (NORVASC) 10 MG tablet Take 1 tablet (10 mg total) by mouth daily. 90 tablet 3  . ASPIRIN 81 PO Take 1 tablet by mouth daily.    . Blood Glucose Monitoring Suppl (BLOOD GLUCOSE SYSTEM PAK) KIT Please dispense based  on patient and insurance preference. Use as directed to monitor FSBS 2x daily. Dx: E11.9. 1 each 1  . cyanocobalamin (,VITAMIN B-12,) 1000 MCG/ML injection Inject 1 mL (1,000 mcg total) into the muscle every 30 (thirty) days. 1 mL 0  . gabapentin (NEURONTIN) 300 MG capsule Take 1 capsule (300 mg total) by mouth at bedtime. 90 capsule 3  . Garlic (GARLIQUE) 947 MG TBEC Take 400 mg by mouth every evening.     Marland Kitchen glipiZIDE (GLUCOTROL) 5 MG tablet Take 1 tablet (5 mg total) by mouth 2 (two) times daily. 180 tablet 3  . Glucose Blood (BLOOD GLUCOSE TEST STRIPS) STRP Please dispense based on patient and insurance preference. Use as directed to monitor FSBS 2x daily. Dx: E11.9. 100 each 11  . HM LORATADINE 10 MG tablet TAKE ONE TABLET (10MG TOTAL) BY MOUTH DAILY 30 tablet 0  . hydrochlorothiazide (MICROZIDE) 12.5 MG capsule TAKE ONE CAPSULE BY MOUTH DAILY (Patient taking differently: Take 12.5 mg by mouth daily. ) 90 capsule 3  . HYDROcodone-acetaminophen (NORCO/VICODIN) 5-325 MG tablet Take 1 tablet by mouth every 6 (six) hours as needed for moderate pain. 60 tablet 0  . Lancets MISC Please dispense based on patient and insurance preference. Use as directed to monitor FSBS 2x daily. Dx: E11.9. 100 each 11  . lenalidomide (REVLIMID) 15 MG capsule Take 1 capsule (15 mg total) by  mouth daily. 14 capsule 0  . meclizine (ANTIVERT) 25 MG tablet Take 25 mg by mouth every 6 (six) hours as needed for dizziness.     . metFORMIN (GLUCOPHAGE) 1000 MG tablet TAKE ONE TABLET BY MOUTH TWICE A DAY 60 tablet 3  . metoprolol tartrate (LOPRESSOR) 25 MG tablet Take 0.5 tablets (12.5 mg total) by mouth 2 (two) times daily. 90 tablet 3  . Omega-3 Fatty Acids (OMEGA-3 FISH OIL PO) Take 1 capsule by mouth every evening.     Marland Kitchen omeprazole (PRILOSEC) 40 MG capsule TAKE ONE CAPSULE BY MOUTH DAILY (Patient taking differently: Take 40 mg by mouth daily. ) 90 capsule 3  . prochlorperazine (COMPAZINE) 10 MG tablet Take 1 tablet (10 mg total) by mouth every 6 (six) hours as needed (Nausea or vomiting). 30 tablet 1  . simvastatin (ZOCOR) 40 MG tablet TAKE ONE TABLET (40MG TOTAL) BY MOUTH BEDTIME (Patient taking differently: Take 40 mg by mouth at bedtime. ) 90 tablet 3  . SYNTHROID 150 MCG tablet Take 150 mcg by mouth daily.    Marland Kitchen tolterodine (DETROL LA) 4 MG 24 hr capsule TAKE ONE CAPSULE BY MOUTH DAILY (Patient taking differently: Take 4 mg by mouth daily. ) 90 capsule 3  . traMADol (ULTRAM) 50 MG tablet Take 1 tablet (50 mg total) by mouth every 6 (six) hours as needed. 30 tablet 0   No current facility-administered medications for this visit.    ALLERGIES:  No Known Allergies  PHYSICAL EXAM:  Performance status (ECOG): 2 - Symptomatic, <50% confined to bed  Vitals:   11/26/19 1004  BP: (!) 140/48  Pulse: 71  Resp: 16  SpO2: 99%   Wt Readings from Last 3 Encounters:  11/26/19 181 lb 7 oz (82.3 kg)  11/12/19 181 lb (82.1 kg)  10/29/19 176 lb 11.2 oz (80.2 kg)   Physical Exam Constitutional:      Appearance: Normal appearance.  HENT:     Head: Normocephalic and atraumatic.  Eyes:     Pupils: Pupils are equal, round, and reactive to light.  Cardiovascular:  Rate and Rhythm: Normal rate and regular rhythm.     Heart sounds: Normal heart sounds. No murmur heard.   Pulmonary:      Effort: Pulmonary effort is normal.     Breath sounds: Normal breath sounds. No wheezing.  Abdominal:     General: Bowel sounds are normal. There is no distension.     Palpations: Abdomen is soft.     Tenderness: There is no abdominal tenderness.  Musculoskeletal:        General: Normal range of motion.     Cervical back: Normal range of motion.  Skin:    General: Skin is warm and dry.     Findings: No rash.  Neurological:     Mental Status: She is alert and oriented to person, place, and time.  Psychiatric:        Judgment: Judgment normal.     LABORATORY DATA:  I have reviewed the labs as listed.  CBC Latest Ref Rng & Units 11/26/2019 11/19/2019 11/12/2019  WBC 4.0 - 10.5 K/uL 9.9 7.9 6.9  Hemoglobin 12.0 - 15.0 g/dL 8.5(L) 8.1(L) 7.2(L)  Hematocrit 36 - 46 % 27.4(L) 27.1(L) 23.6(L)  Platelets 150 - 400 K/uL 243 272 181   CMP Latest Ref Rng & Units 11/26/2019 11/19/2019 11/12/2019  Glucose 70 - 99 mg/dL 233(H) 204(H) 203(H)  BUN 8 - 23 mg/dL 17 17 19   Creatinine 0.44 - 1.00 mg/dL 0.95 0.88 1.03(H)  Sodium 135 - 145 mmol/L 138 139 136  Potassium 3.5 - 5.1 mmol/L 3.7 3.4(L) 3.7  Chloride 98 - 111 mmol/L 100 106 99  CO2 22 - 32 mmol/L 27 25 26   Calcium 8.9 - 10.3 mg/dL 8.8(L) 9.0 8.6(L)  Total Protein 6.5 - 8.1 g/dL 6.6 7.2 6.6  Total Bilirubin 0.3 - 1.2 mg/dL 0.7 1.0 0.9  Alkaline Phos 38 - 126 U/L 72 70 76  AST 15 - 41 U/L 12(L) 16 29  ALT 0 - 44 U/L 10 17 36   Lab Results  Component Value Date   LDH 164 11/19/2019   LDH 146 11/12/2019   LDH 134 10/29/2019   Lab Results  Component Value Date   TOTALPROTELP 6.6 11/19/2019   ALBUMINELP 3.4 11/19/2019   A1GS 0.3 11/19/2019   A2GS 0.7 11/19/2019   BETS 1.0 11/19/2019   GAMS 1.3 11/19/2019   MSPIKE 1.1 (H) 11/19/2019   SPEI Comment 11/19/2019    Lab Results  Component Value Date   KPAFRELGTCHN 32.5 (H) 11/19/2019   LAMBDASER 20.9 11/19/2019   KAPLAMBRATIO 1.56 11/19/2019    DIAGNOSTIC IMAGING:  I have  independently reviewed the scans and discussed with the patient. No results found.   ASSESSMENT:  1. Normocytic anemia: -Patient seen at the request of Dr. Buelah Manis for further work-up and management of normocytic anemia. -Initial CBC showed hemoglobin 6.9 with MCV of 88.3. White count and platelets are normal. No history of CKD. Denies any bleeding per rectum or melena. Last colonoscopy on 04/01/2014 shows diffusely pigmented rectal mucosa consistent with melanosis [name] Diffusely pigmented colonic mucosa, 2 diminutive polyps in the mid ascending segment, otherwise the remainder of the colon mucosa was normal. Biopsy consistent with tubular adenoma. -CT renal study on 05/11/2019 shows normal-sized spleen with normal liver. No other abnormal adenopathy. -Denies any prior history of blood transfusion. She is currently taking iron tablet twice daily.  2. IgG lambda plasma cell myeloma: -Skeletal survey on 09/12/2019 shows diffuse faint lucencies in the skull, bilateral femurs. -Bone  marrow biopsy on 09/27/2019 shows 70% plasma cells in hypercellular marrow. Chromosome analysis was 45, XX. FISH panel was normal.  However quality of specimen is compromised. -LDH normal, beta-2 microglobulin 2.9. M spike was 2.1 g. Kappa light chains 19.7, lambda light chains 14.3 with ratio of 1.38. -24-hour urine shows nonnephrotic range proteinuria. Urine immunofixation was positive.   PLAN:  1.IgG lambda plasma cell myeloma: -She is tolerating Revlimid very well.  She is taking 15 mg 2 weeks on 1 week off. -She has one more week of revlamid and will then take a week off. -She will receive Velcade injection today. Mild intermittent neuropathy in hands and feet. -Recommend follow-up in 2 weeks after myeloma panel next week.  2. Diabetes: -Continue glipizide and Metformin.  3. Hypertension: -Continue amlodipine and HCTZ.  4. Hypothyroidism: -Continue Synthroid  daily.  5. B12 deficiency: -Continue B12 monthly injections.  6. Right posterior hip pain: -Continue hydrocodone 5/325 every 6 hours as needed.  7.  Infection prophylaxis: -Continue acyclovir 400 mg twice daily.  Continue aspirin 81 mg daily.  8.  Normocytic anemia: -Hemoglobin was 7.2 with MCV of 91 on 11/12/2019.  Likely from infiltration by myeloma cells. -Ferritin is 256, percent saturation is 23, folic acid and W96 was normal. -Hemoglobin improving is 8.5 today.  9.  Dysphagia: -Unclear etiology. -We will place referral for swallow eval given it's persistent.    Greater than 50% was spent in counseling and coordination of care with this patient including but not limited to discussion of the relevant topics above (See A&P) including, but not limited to diagnosis and management of acute and chronic medical conditions.    Orders placed this encounter:  No orders of the defined types were placed in this encounter.  Faythe Casa, NP 11/26/2019 3:17 PM [place] [phone]

## 2019-11-27 LAB — PATHOLOGIST SMEAR REVIEW

## 2019-11-28 ENCOUNTER — Other Ambulatory Visit: Payer: Self-pay | Admitting: Oncology

## 2019-11-28 DIAGNOSIS — R1310 Dysphagia, unspecified: Secondary | ICD-10-CM

## 2019-12-01 ENCOUNTER — Other Ambulatory Visit: Payer: Self-pay | Admitting: Family Medicine

## 2019-12-03 ENCOUNTER — Inpatient Hospital Stay (HOSPITAL_COMMUNITY): Payer: Medicare Other

## 2019-12-03 ENCOUNTER — Other Ambulatory Visit: Payer: Self-pay

## 2019-12-03 ENCOUNTER — Encounter (HOSPITAL_COMMUNITY): Payer: Self-pay

## 2019-12-03 ENCOUNTER — Telehealth (HOSPITAL_COMMUNITY): Payer: Self-pay | Admitting: Oncology

## 2019-12-03 VITALS — BP 147/51 | HR 66 | Temp 96.8°F | Resp 18

## 2019-12-03 DIAGNOSIS — C9000 Multiple myeloma not having achieved remission: Secondary | ICD-10-CM | POA: Diagnosis not present

## 2019-12-03 DIAGNOSIS — L750 Bromhidrosis: Secondary | ICD-10-CM

## 2019-12-03 DIAGNOSIS — Z5112 Encounter for antineoplastic immunotherapy: Secondary | ICD-10-CM | POA: Diagnosis not present

## 2019-12-03 LAB — COMPREHENSIVE METABOLIC PANEL
ALT: 11 U/L (ref 0–44)
AST: 12 U/L — ABNORMAL LOW (ref 15–41)
Albumin: 3.5 g/dL (ref 3.5–5.0)
Alkaline Phosphatase: 73 U/L (ref 38–126)
Anion gap: 11 (ref 5–15)
BUN: 12 mg/dL (ref 8–23)
CO2: 25 mmol/L (ref 22–32)
Calcium: 8.6 mg/dL — ABNORMAL LOW (ref 8.9–10.3)
Chloride: 103 mmol/L (ref 98–111)
Creatinine, Ser: 0.87 mg/dL (ref 0.44–1.00)
GFR, Estimated: >60 mL/min (ref >60)
Glucose, Bld: 168 mg/dL — ABNORMAL HIGH (ref 70–99)
Potassium: 3.3 mmol/L — ABNORMAL LOW (ref 3.5–5.1)
Sodium: 139 mmol/L (ref 135–145)
Total Bilirubin: 0.7 mg/dL (ref 0.3–1.2)
Total Protein: 6.6 g/dL (ref 6.5–8.1)

## 2019-12-03 LAB — URINALYSIS, ROUTINE W REFLEX MICROSCOPIC
Bilirubin Urine: NEGATIVE
Glucose, UA: NEGATIVE mg/dL
Hgb urine dipstick: NEGATIVE
Ketones, ur: NEGATIVE mg/dL
Nitrite: NEGATIVE
Protein, ur: NEGATIVE mg/dL
Specific Gravity, Urine: 1.01 (ref 1.005–1.030)
WBC, UA: >50 WBC/hpf — ABNORMAL HIGH (ref 0–5)
pH: 5 (ref 5.0–8.0)

## 2019-12-03 LAB — CBC WITH DIFFERENTIAL/PLATELET
Abs Immature Granulocytes: 0.13 10*3/uL — ABNORMAL HIGH (ref 0.00–0.07)
Basophils Absolute: 0.1 10*3/uL (ref 0.0–0.1)
Basophils Relative: 1 %
Eosinophils Absolute: 1.2 10*3/uL — ABNORMAL HIGH (ref 0.0–0.5)
Eosinophils Relative: 13 %
HCT: 29.9 % — ABNORMAL LOW (ref 36.0–46.0)
Hemoglobin: 8.7 g/dL — ABNORMAL LOW (ref 12.0–15.0)
Immature Granulocytes: 1 %
Lymphocytes Relative: 38 %
Lymphs Abs: 3.6 10*3/uL (ref 0.7–4.0)
MCH: 27.8 pg (ref 26.0–34.0)
MCHC: 29.1 g/dL — ABNORMAL LOW (ref 30.0–36.0)
MCV: 95.5 fL (ref 80.0–100.0)
Monocytes Absolute: 0.8 10*3/uL (ref 0.1–1.0)
Monocytes Relative: 9 %
Neutro Abs: 3.6 10*3/uL (ref 1.7–7.7)
Neutrophils Relative %: 38 %
Platelets: 209 10*3/uL (ref 150–400)
RBC: 3.13 MIL/uL — ABNORMAL LOW (ref 3.87–5.11)
RDW: 23.2 % — ABNORMAL HIGH (ref 11.5–15.5)
WBC: 9.4 10*3/uL (ref 4.0–10.5)
nRBC: 0.6 % — ABNORMAL HIGH (ref 0.0–0.2)

## 2019-12-03 MED ORDER — SULFAMETHOXAZOLE-TRIMETHOPRIM 800-160 MG PO TABS
1.0000 | ORAL_TABLET | Freq: Two times a day (BID) | ORAL | 0 refills | Status: DC
Start: 2019-12-03 — End: 2019-12-18

## 2019-12-03 MED ORDER — BORTEZOMIB CHEMO SQ INJECTION 3.5 MG (2.5MG/ML)
1.3000 mg/m2 | Freq: Once | INTRAMUSCULAR | Status: AC
  Administered 2019-12-03: 2.25 mg via SUBCUTANEOUS
  Filled 2019-12-03: qty 0.9

## 2019-12-03 MED ORDER — DEXAMETHASONE 4 MG PO TABS
20.0000 mg | ORAL_TABLET | Freq: Once | ORAL | Status: AC
  Administered 2019-12-03: 20 mg via ORAL
  Filled 2019-12-03: qty 5

## 2019-12-03 NOTE — Progress Notes (Signed)
I called her in a prescription for Bactrim.  Would you mind calling her and letting her know?  Faythe Casa, NP 12/03/2019 4:19 PM

## 2019-12-03 NOTE — Telephone Encounter (Signed)
Re: urinary tract infection  Patient presented today for her Velcade injection complaining of increased urinary frequency and dysuria.  Urinalysis shows a large amount of leukocytes with some bacteria.  Urine culture sent.  Given she is symptomatic, we will go ahead and start Bactrim 800-160 mg twice daily x5 days.  Mrs. Nesby has no drug allergies.  No dose reduction needed.  Faythe Casa, NP 12/03/2019 4:18 PM

## 2019-12-03 NOTE — Progress Notes (Signed)
Kim Mills tolerated Velcade injection well without complaints or incident. Labs reviewed prior to administering this medication. Pt did have complaints on burning with urination and strong odor noted to urine. This information was discussed with JBurns NP and order obtained for Urinalysis with microscopic per NP. VSS Pt discharged via wheelchair in satisfactory condition accompanied by caregiver

## 2019-12-03 NOTE — Patient Instructions (Signed)
[facility] Discharge Instructions for Patients Receiving Chemotherapy   Beginning January 23rd 2017 lab work for the [facility] will be done in the  Main lab at [facility] on 1st floor. If you have a lab appointment with the [facility] please come in thru the  Main Entrance and check in at the main information desk   Today you received the following chemotherapy agents Velcade injection. Follow-up as scheduled  To help prevent nausea and vomiting after your treatment, we encourage you to take your nausea medication   If you develop nausea and vomiting, or diarrhea that is not controlled by your medication, call the clinic.  The clinic phone number is [phone]. Office hours are Monday-Friday 8:30am-5:00pm.  BELOW ARE SYMPTOMS THAT SHOULD BE REPORTED IMMEDIATELY:  *FEVER GREATER THAN 101.0 F  *CHILLS WITH OR WITHOUT FEVER  NAUSEA AND VOMITING THAT IS NOT CONTROLLED WITH YOUR NAUSEA MEDICATION  *UNUSUAL SHORTNESS OF BREATH  *UNUSUAL BRUISING OR BLEEDING  TENDERNESS IN MOUTH AND THROAT WITH OR WITHOUT PRESENCE OF ULCERS  *URINARY PROBLEMS  *BOWEL PROBLEMS  UNUSUAL RASH Items with * indicate a potential emergency and should be followed up as soon as possible. If you have an emergency after office hours please contact your primary care physician or go to the nearest emergency department.  Please call the clinic during office hours if you have any questions or concerns.   You may also contact the Patient Navigator at [phone] should you have any questions or need assistance in obtaining follow up care.      Resources For Cancer Patients and their Caregivers ? American Cancer Society: Can assist with transportation, wigs, general needs, runs Look Good Feel Better.        586-055-7051 ? Cancer Care: Provides financial assistance, online support groups, medication/co-pay assistance.  1-800-813-HOPE 6126024419) ? [facility] Assists [place] cancer patients and their families through emotional , educational and financial support.  579-410-9516 ? [place] DSS Where to apply for food stamps, Medicaid and utility assistance. 603-226-8404 ? [facility]: Transportation to medical appointments. 4323203427 ? Social Security Administration: May apply for disability if have a Stage IV cancer. (905)518-4942 [phone] [phone] ? [employer], Disability and Transit Services: Assists with nutrition, care and transit needs. [phone]

## 2019-12-05 ENCOUNTER — Other Ambulatory Visit (HOSPITAL_COMMUNITY): Payer: Self-pay

## 2019-12-05 ENCOUNTER — Encounter (HOSPITAL_COMMUNITY): Payer: Self-pay

## 2019-12-05 MED ORDER — LENALIDOMIDE 15 MG PO CAPS
15.0000 mg | ORAL_CAPSULE | Freq: Every day | ORAL | 0 refills | Status: DC
Start: 2019-12-05 — End: 2019-12-21

## 2019-12-05 NOTE — Telephone Encounter (Signed)
Chart reviewed. Revlimid refilled per Faythe Casa, NP last office visit.

## 2019-12-06 ENCOUNTER — Other Ambulatory Visit (HOSPITAL_COMMUNITY): Payer: Self-pay

## 2019-12-07 ENCOUNTER — Ambulatory Visit: Payer: Self-pay | Admitting: Pharmacist

## 2019-12-07 NOTE — Chronic Care Management (AMB) (Signed)
Chronic Care Management   Follow Up Note   12/07/2019 Name: Kim Mills MRN: [id number] DOB: [birth year]  Referred by: Alycia Rossetti, MD Reason for referral : No chief complaint on file.   Kim Mills is a 79 y.o. year old female who is a primary care patient of Kinder, Modena Nunnery, MD. The CCM team was consulted for assistance with chronic disease management and care coordination needs.    Review of patient status, including review of consultants reports, relevant laboratory and other test results, and collaboration with appropriate care team members and the patient's provider was performed as part of comprehensive patient evaluation and provision of chronic care management services.    SDOH (Social Determinants of Health) assessments performed: No See Care Plan activities for detailed interventions related to [facility])     Outpatient Encounter Medications as of 12/07/2019  Medication Sig  . acyclovir (ZOVIRAX) 400 MG tablet Take 1 tablet (400 mg total) by mouth 2 (two) times daily.  Marland Kitchen amLODipine (NORVASC) 10 MG tablet Take 1 tablet (10 mg total) by mouth daily.  . ASPIRIN 81 PO Take 1 tablet by mouth daily.  . Blood Glucose Monitoring Suppl (BLOOD GLUCOSE SYSTEM PAK) KIT Please dispense based on patient and insurance preference. Use as directed to monitor FSBS 2x daily. Dx: E11.9.  Marland Kitchen cyanocobalamin (,VITAMIN B-12,) 1000 MCG/ML injection Inject 1 mL (1,000 mcg total) into the muscle every 30 (thirty) days. (Patient not taking: Reported on 11/26/2019)  . gabapentin (NEURONTIN) 300 MG capsule Take 1 capsule (300 mg total) by mouth at bedtime.  . Garlic (GARLIQUE) 409 MG TBEC Take 400 mg by mouth every evening.   Marland Kitchen glipiZIDE (GLUCOTROL) 5 MG tablet Take 1 tablet (5 mg total) by mouth 2 (two) times daily.  . Glucose Blood (BLOOD GLUCOSE TEST STRIPS) STRP Please dispense based on patient and insurance preference. Use as directed to monitor FSBS 2x daily. Dx: E11.9.  . HM LORATADINE  10 MG tablet TAKE ONE TABLET ($RemoveBef'10MG'PzKPFeIZmQ$  TOTAL) BY MOUTH DAILY  . hydrochlorothiazide (MICROZIDE) 12.5 MG capsule TAKE ONE CAPSULE BY MOUTH DAILY (Patient taking differently: Take 12.5 mg by mouth daily. )  . HYDROcodone-acetaminophen (NORCO/VICODIN) 5-325 MG tablet Take 1 tablet by mouth every 6 (six) hours as needed for moderate pain.  . Lancets MISC Please dispense based on patient and insurance preference. Use as directed to monitor FSBS 2x daily. Dx: E11.9.  . lenalidomide (REVLIMID) 15 MG capsule Take 1 capsule (15 mg total) by mouth daily.  . meclizine (ANTIVERT) 25 MG tablet Take 25 mg by mouth every 6 (six) hours as needed for dizziness.   . metFORMIN (GLUCOPHAGE) 1000 MG tablet TAKE ONE TABLET BY MOUTH TWICE A DAY  . metoprolol tartrate (LOPRESSOR) 25 MG tablet Take 0.5 tablets (12.5 mg total) by mouth 2 (two) times daily.  . Omega-3 Fatty Acids (OMEGA-3 FISH OIL PO) Take 1 capsule by mouth every evening.   Marland Kitchen omeprazole (PRILOSEC) 40 MG capsule TAKE ONE CAPSULE BY MOUTH DAILY (Patient taking differently: Take 40 mg by mouth daily. )  . prochlorperazine (COMPAZINE) 10 MG tablet Take 1 tablet (10 mg total) by mouth every 6 (six) hours as needed (Nausea or vomiting).  . simvastatin (ZOCOR) 40 MG tablet TAKE ONE TABLET ($RemoveBef'40MG'VjkvlMmQqm$  TOTAL) BY MOUTH BEDTIME (Patient taking differently: Take 40 mg by mouth at bedtime. )  . sulfamethoxazole-trimethoprim (BACTRIM DS) 800-160 MG tablet Take 1 tablet by mouth 2 (two) times daily.  Marland Kitchen SYNTHROID 150 MCG tablet TAKE ONE (  1) TABLET BY MOUTH EVERY DAY  . tolterodine (DETROL LA) 4 MG 24 hr capsule TAKE ONE CAPSULE BY MOUTH DAILY (Patient taking differently: Take 4 mg by mouth daily. )  . traMADol (ULTRAM) 50 MG tablet Take 1 tablet (50 mg total) by mouth every 6 (six) hours as needed.   No facility-administered encounter medications on file as of 12/07/2019.     Objective:  Utilized medicare date to assess care gaps.  Adherence unable to be assessed due to lack of  Rx benefit card on file.  Goals Addressed   None    Care gaps - (2)  AWV not performed  BP > 140 - currently working with patient on BP  Plan:   The patient has been provided with contact information for the care management team and has been advised to call with any health related questions or concerns.    Beverly Milch, PharmD Clinical Pharmacist [place] [phone]

## 2019-12-10 ENCOUNTER — Inpatient Hospital Stay (HOSPITAL_COMMUNITY): Payer: Medicare Other

## 2019-12-10 ENCOUNTER — Other Ambulatory Visit (HOSPITAL_COMMUNITY): Payer: Self-pay | Admitting: Oncology

## 2019-12-10 ENCOUNTER — Other Ambulatory Visit: Payer: Self-pay

## 2019-12-10 ENCOUNTER — Inpatient Hospital Stay (HOSPITAL_COMMUNITY): Payer: Medicare Other | Attending: Hematology

## 2019-12-10 ENCOUNTER — Inpatient Hospital Stay (HOSPITAL_BASED_OUTPATIENT_CLINIC_OR_DEPARTMENT_OTHER): Payer: Medicare Other | Admitting: Oncology

## 2019-12-10 VITALS — BP 123/39 | HR 70 | Temp 96.8°F | Resp 17 | Wt 186.7 lb

## 2019-12-10 DIAGNOSIS — Z5112 Encounter for antineoplastic immunotherapy: Secondary | ICD-10-CM | POA: Insufficient documentation

## 2019-12-10 DIAGNOSIS — C9000 Multiple myeloma not having achieved remission: Secondary | ICD-10-CM

## 2019-12-10 DIAGNOSIS — Z23 Encounter for immunization: Secondary | ICD-10-CM | POA: Diagnosis not present

## 2019-12-10 LAB — CBC WITH DIFFERENTIAL/PLATELET
Abs Immature Granulocytes: 0.05 10*3/uL (ref 0.00–0.07)
Basophils Absolute: 0.2 10*3/uL — ABNORMAL HIGH (ref 0.0–0.1)
Basophils Relative: 2 %
Eosinophils Absolute: 1.3 10*3/uL — ABNORMAL HIGH (ref 0.0–0.5)
Eosinophils Relative: 13 %
HCT: 28.8 % — ABNORMAL LOW (ref 36.0–46.0)
Hemoglobin: 8.5 g/dL — ABNORMAL LOW (ref 12.0–15.0)
Immature Granulocytes: 1 %
Lymphocytes Relative: 35 %
Lymphs Abs: 3.5 10*3/uL (ref 0.7–4.0)
MCH: 28.2 pg (ref 26.0–34.0)
MCHC: 29.5 g/dL — ABNORMAL LOW (ref 30.0–36.0)
MCV: 95.7 fL (ref 80.0–100.0)
Monocytes Absolute: 0.7 10*3/uL (ref 0.1–1.0)
Monocytes Relative: 7 %
Neutro Abs: 4.3 10*3/uL (ref 1.7–7.7)
Neutrophils Relative %: 42 %
Platelets: 234 10*3/uL (ref 150–400)
RBC: 3.01 MIL/uL — ABNORMAL LOW (ref 3.87–5.11)
RDW: 23.6 % — ABNORMAL HIGH (ref 11.5–15.5)
WBC: 10 10*3/uL (ref 4.0–10.5)
nRBC: 0.5 % — ABNORMAL HIGH (ref 0.0–0.2)

## 2019-12-10 LAB — COMPREHENSIVE METABOLIC PANEL
ALT: 10 U/L (ref 0–44)
AST: 10 U/L — ABNORMAL LOW (ref 15–41)
Albumin: 3.6 g/dL (ref 3.5–5.0)
Alkaline Phosphatase: 72 U/L (ref 38–126)
Anion gap: 9 (ref 5–15)
BUN: 23 mg/dL (ref 8–23)
CO2: 23 mmol/L (ref 22–32)
Calcium: 9 mg/dL (ref 8.9–10.3)
Chloride: 104 mmol/L (ref 98–111)
Creatinine, Ser: 1.17 mg/dL — ABNORMAL HIGH (ref 0.44–1.00)
GFR, Estimated: 48 mL/min — ABNORMAL LOW (ref >60)
Glucose, Bld: 142 mg/dL — ABNORMAL HIGH (ref 70–99)
Potassium: 3.9 mmol/L (ref 3.5–5.1)
Sodium: 136 mmol/L (ref 135–145)
Total Bilirubin: 0.8 mg/dL (ref 0.3–1.2)
Total Protein: 6.7 g/dL (ref 6.5–8.1)

## 2019-12-10 LAB — LACTATE DEHYDROGENASE: LDH: 148 U/L (ref 98–192)

## 2019-12-10 MED ORDER — BORTEZOMIB CHEMO SQ INJECTION 3.5 MG (2.5MG/ML)
1.3000 mg/m2 | Freq: Once | INTRAMUSCULAR | Status: AC
  Administered 2019-12-10: 2.25 mg via SUBCUTANEOUS
  Filled 2019-12-10: qty 0.9

## 2019-12-10 MED ORDER — DEXAMETHASONE 4 MG PO TABS
20.0000 mg | ORAL_TABLET | Freq: Once | ORAL | Status: AC
  Administered 2019-12-10: 20 mg via ORAL
  Filled 2019-12-10: qty 5

## 2019-12-10 NOTE — Progress Notes (Signed)
[place] reviewed with and pt seen by JBurns NP and pt approved for Velcade injection today per NP                                  [name] tolerated Velcade injection well without complaints or incident.Pt discharged via wheelchair in satisfactory condition accompanied by family member

## 2019-12-10 NOTE — Progress Notes (Signed)
Kim Mills, [place] [address]   CLINIC:  Medical Oncology/Hematology  PCP:  Alycia Rossetti, MD [id number] [place] [address] [phone]   REASON FOR VISIT:  Follow-up for normocytic anemia and multiple myeloma  PRIOR THERAPY: None  NGS Results: Not done  CURRENT THERAPY: Bortezomib weekly; Revlimid 2 weeks on, 1 week off  BRIEF ONCOLOGIC HISTORY:  Oncology History  Multiple myeloma not having achieved remission ([place])  10/13/2019 Initial Diagnosis   Multiple myeloma not having achieved remission ([place])   10/29/2019 -  Chemotherapy   The patient had dexamethasone (DECADRON) tablet 20 mg, 20 mg (100 % of original dose 20 mg), Oral,  Once, 3 of 8 cycles Dose modification: 20 mg (original dose 20 mg, Cycle 1) Administration: 20 mg (10/29/2019), 20 mg (11/05/2019), 20 mg (11/12/2019), 20 mg (11/19/2019), 20 mg (11/26/2019), 20 mg (12/03/2019) bortezomib SQ (VELCADE) chemo injection 2.25 mg, 1.3 mg/m2 = 2.25 mg, Subcutaneous,  Once, 3 of 8 cycles Administration: 2.25 mg (10/29/2019), 2.25 mg (11/05/2019), 2.25 mg (11/12/2019), 2.25 mg (11/19/2019), 2.25 mg (11/26/2019), 2.25 mg (12/03/2019)  for chemotherapy treatment.      CANCER STAGING: Cancer Staging No matching staging information was found for the patient.  INTERVAL HISTORY:  Ms. HANALEI Mills, a 79 y.o. female, returns for routine follow-up and consideration for next cycle of chemotherapy. Nattalie was last seen on 12/03/2019.   Due for day # 1 of cycle 3 of bortezomib today.   Today she is accompanied by her daughter.  Overall she states she has been doing good.  She will begin her Revlimid today X 2 weeks.  She just completed her 1 week off.  She feels she is tolerating treatments well.  Recently treated for urinary tract infection.  Daughter noticed a strong odor to her urine and asked that her urine be checked at her last visit.  She was given a prescription for Bactrim  for 5 days.  She has persistent dysphagia and coughing spells with liquids and some of her foods and medications.  A referral was placed for a swallow evaluation at her last visit.  This has not been set up yet.  Energy and appetite is stable.   Overall, she feels ready to begin cycle 3 of Velcade and Revlimid.   REVIEW OF SYSTEMS:  Review of Systems  HENT:   Positive for trouble swallowing.   Respiratory: Positive for shortness of breath.   Gastrointestinal: Positive for constipation.  Genitourinary:        Recently treated for UTI.  Neurological: Positive for numbness.    PAST MEDICAL/SURGICAL HISTORY:  Past Medical History:  Diagnosis Date  . Arthritis   . Cataract   . Chest pain    Associated with weakness and fatigue  . Diabetes mellitus    A1c of 7.4 in 08/2010  . GERD (gastroesophageal reflux disease)   . Glaucoma   . Hyperlipidemia    Lipid profile in 08/2010:190, 121, 48, 118; normal CBC and CMet  . Hypertension    Lipid profile in 08/2010:190, 121, 48, 118; normal CBC and CMet  . Hypothyroidism   . Hypothyroidism   . Obesity    Past Surgical History:  Procedure Laterality Date  . CHOLECYSTECTOMY    . COLONOSCOPY N/A 04/01/2014   RMR: Melanosis coli. colonic polyps removed as described above.   . CYSTOSCOPY W/ URETERAL STENT PLACEMENT Right 09/13/2019   Procedure: CYSTOSCOPY WITH RIGHT  RETROGRADE PYELOGRAM; BLADDER BIOPSY;  Surgeon: Cleon Gustin, MD;  Location: [facility];  Service: Urology;  Laterality: Right;  . LEFT HEART CATH AND CORONARY ANGIOGRAPHY N/A 10/07/2017   Procedure: LEFT HEART CATH AND CORONARY ANGIOGRAPHY;  Surgeon: Troy Sine, MD;  Location: [facility];  Service: Cardiovascular;  Laterality: N/A;  . THYROIDECTOMY, PARTIAL      SOCIAL HISTORY:  Social History   Socioeconomic History  . Marital status: Widowed    Spouse name: Not on file  . Number of children: 7  . Years of education: Not on file  . Highest education level: Not  on file  Occupational History    Employer: RETIRED  Tobacco Use  . Smoking status: Passive Smoke Exposure - Never Smoker  . Smokeless tobacco: Never Used  Vaping Use  . Vaping Use: Never used  Substance and Sexual Activity  . Alcohol use: Never  . Drug use: Never  . Sexual activity: Not Currently  Other Topics Concern  . Not on file  Social History Narrative  . Not on file   Social Determinants of Health   Financial Resource Strain:   . Difficulty of Paying Living Expenses: Not on file  Food Insecurity:   . Worried About Charity fundraiser in the Last Year: Not on file  . Ran Out of Food in the Last Year: Not on file  Transportation Needs:   . Lack of Transportation (Medical): Not on file  . Lack of Transportation (Non-Medical): Not on file  Physical Activity:   . Days of Exercise per Week: Not on file  . Minutes of Exercise per Session: Not on file  Stress:   . Feeling of Stress : Not on file  Social Connections:   . Frequency of Communication with Friends and Family: Not on file  . Frequency of Social Gatherings with Friends and Family: Not on file  . Attends Religious Services: Not on file  . Active Member of Clubs or Organizations: Not on file  . Attends Archivist Meetings: Not on file  . Marital Status: Not on file  Intimate Partner Violence:   . Fear of Current or Ex-Partner: Not on file  . Emotionally Abused: Not on file  . Physically Abused: Not on file  . Sexually Abused: Not on file    FAMILY HISTORY:  Family History  Problem Relation Age of Onset  . Anemia Father   . Arthritis Mother   . Stroke Brother   . Cancer Sister        unknown kind  . Diabetes Sister   . Dementia Sister   . Cancer Niece        Breast  . Cancer Brother   . Healthy Son   . Healthy Son   . Healthy Son   . Healthy Daughter   . Healthy Daughter   . Healthy Daughter   . Sarcoidosis Daughter     CURRENT MEDICATIONS:  Current Outpatient Medications    Medication Sig Dispense Refill  . acyclovir (ZOVIRAX) 400 MG tablet Take 1 tablet (400 mg total) by mouth 2 (two) times daily. 60 tablet 6  . amLODipine (NORVASC) 10 MG tablet Take 1 tablet (10 mg total) by mouth daily. 90 tablet 3  . ASPIRIN 81 PO Take 1 tablet by mouth daily.    . Blood Glucose Monitoring Suppl (BLOOD GLUCOSE SYSTEM PAK) KIT Please dispense based on patient and insurance preference. Use as directed to monitor FSBS 2x daily.  Dx: E11.9. 1 each 1  . cyanocobalamin (,VITAMIN B-12,) 1000 MCG/ML injection Inject 1 mL (1,000 mcg total) into the muscle every 30 (thirty) days. 1 mL 0  . gabapentin (NEURONTIN) 300 MG capsule Take 1 capsule (300 mg total) by mouth at bedtime. 90 capsule 3  . Garlic (GARLIQUE) 347 MG TBEC Take 400 mg by mouth every evening.     Marland Kitchen glipiZIDE (GLUCOTROL) 5 MG tablet Take 1 tablet (5 mg total) by mouth 2 (two) times daily. 180 tablet 3  . Glucose Blood (BLOOD GLUCOSE TEST STRIPS) STRP Please dispense based on patient and insurance preference. Use as directed to monitor FSBS 2x daily. Dx: E11.9. 100 each 11  . HM LORATADINE 10 MG tablet TAKE ONE TABLET (10MG  TOTAL) BY MOUTH DAILY 30 tablet 0  . hydrochlorothiazide (MICROZIDE) 12.5 MG capsule TAKE ONE CAPSULE BY MOUTH DAILY (Patient taking differently: Take 12.5 mg by mouth daily. ) 90 capsule 3  . HYDROcodone-acetaminophen (NORCO/VICODIN) 5-325 MG tablet Take 1 tablet by mouth every 6 (six) hours as needed for moderate pain. 60 tablet 0  . Lancets MISC Please dispense based on patient and insurance preference. Use as directed to monitor FSBS 2x daily. Dx: E11.9. 100 each 11  . lenalidomide (REVLIMID) 15 MG capsule Take 1 capsule (15 mg total) by mouth daily. 14 capsule 0  . meclizine (ANTIVERT) 25 MG tablet Take 25 mg by mouth every 6 (six) hours as needed for dizziness.     . metFORMIN (GLUCOPHAGE) 1000 MG tablet TAKE ONE TABLET BY MOUTH TWICE A DAY 60 tablet 3  . metoprolol tartrate (LOPRESSOR) 25 MG tablet  Take 0.5 tablets (12.5 mg total) by mouth 2 (two) times daily. 90 tablet 3  . Omega-3 Fatty Acids (OMEGA-3 FISH OIL PO) Take 1 capsule by mouth every evening.     Marland Kitchen omeprazole (PRILOSEC) 40 MG capsule TAKE ONE CAPSULE BY MOUTH DAILY (Patient taking differently: Take 40 mg by mouth daily. ) 90 capsule 3  . simvastatin (ZOCOR) 40 MG tablet TAKE ONE TABLET (40MG  TOTAL) BY MOUTH BEDTIME (Patient taking differently: Take 40 mg by mouth at bedtime. ) 90 tablet 3  . sulfamethoxazole-trimethoprim (BACTRIM DS) 800-160 MG tablet Take 1 tablet by mouth 2 (two) times daily. 10 tablet 0  . SYNTHROID 137 MCG tablet Take 137 mcg by mouth every morning.    . tolterodine (DETROL LA) 4 MG 24 hr capsule TAKE ONE CAPSULE BY MOUTH DAILY (Patient taking differently: Take 4 mg by mouth daily. ) 90 capsule 3  . traMADol (ULTRAM) 50 MG tablet Take 1 tablet (50 mg total) by mouth every 6 (six) hours as needed. 30 tablet 0  . prochlorperazine (COMPAZINE) 10 MG tablet Take 1 tablet (10 mg total) by mouth every 6 (six) hours as needed (Nausea or vomiting). (Patient not taking: Reported on 12/10/2019) 30 tablet 1  . SYNTHROID 150 MCG tablet TAKE ONE (1) TABLET BY MOUTH EVERY DAY (Patient not taking: Reported on 12/10/2019) 30 tablet 3   No current facility-administered medications for this visit.   Facility-Administered Medications Ordered in Other Visits  Medication Dose Route Frequency Provider Last Rate Last Admin  . bortezomib SQ (VELCADE) chemo injection (2.5mg /mL concentration) 2.25 mg  1.3 mg/m2 (Order-Specific) Subcutaneous Once Derek Jack, MD        ALLERGIES:  No Known Allergies  PHYSICAL EXAM:  Performance status (ECOG): 2 - Symptomatic, <50% confined to bed  Vitals:   12/10/19 1039  BP: (!) 123/39  Pulse: 70  Resp: 17  Temp: (!) 96.8 F (36 C)  SpO2: 98%   Wt Readings from Last 3 Encounters:  12/10/19 186 lb 11.2 oz (84.7 kg)  11/26/19 181 lb 7 oz (82.3 kg)  11/12/19 181 lb (82.1 kg)    Physical Exam Constitutional:      Appearance: Normal appearance.  HENT:     Head: Normocephalic and atraumatic.  Eyes:     Pupils: Pupils are equal, round, and reactive to light.  Cardiovascular:     Rate and Rhythm: Normal rate and regular rhythm.     Heart sounds: Normal heart sounds. No murmur heard.   Pulmonary:     Effort: Pulmonary effort is normal.     Breath sounds: Normal breath sounds. No wheezing.  Abdominal:     General: Bowel sounds are normal. There is no distension.     Palpations: Abdomen is soft.     Tenderness: There is no abdominal tenderness.  Musculoskeletal:        General: Normal range of motion.     Cervical back: Normal range of motion.  Skin:    General: Skin is warm and dry.     Findings: No rash.  Neurological:     Mental Status: She is alert and oriented to person, place, and time.  Psychiatric:        Judgment: Judgment normal.     LABORATORY DATA:  I have reviewed the labs as listed.  CBC Latest Ref Rng & Units 12/10/2019 12/03/2019 11/26/2019  WBC 4.0 - 10.5 K/uL 10.0 9.4 9.9  Hemoglobin 12.0 - 15.0 g/dL 8.5(L) 8.7(L) 8.5(L)  Hematocrit 36 - 46 % 28.8(L) 29.9(L) 27.4(L)  Platelets 150 - 400 K/uL 234 209 243   CMP Latest Ref Rng & Units 12/10/2019 12/03/2019 11/26/2019  Glucose 70 - 99 mg/dL 142(H) 168(H) 233(H)  BUN 8 - 23 mg/dL _0 Creatinine 0.44 - 1.00 mg/dL 1.17(H) 0.87 0.95  Sodium 135 - 145 mmol/L 136 139 138  Potassium 3.5 - 5.1 mmol/L 3.9 3.3(L) 3.7  Chloride 98 - 111 mmol/L 104 103 100  CO2 22 - 32 mmol/L _1 Calcium 8.9 - 10.3 mg/dL 9.0 8.6(L) 8.8(L)  Total Protein 6.5 - 8.1 g/dL 6.7 6.6 6.6  Total Bilirubin 0.3 - 1.2 mg/dL 0.8 0.7 0.7  Alkaline Phos 38 - 126 U/L 72 73 72  AST 15 - 41 U/L 10(L) 12(L) 12(L)  ALT 0 - 44 U/L _2 Lab Results  Component Value Date   LDH 148 12/10/2019   LDH 164 11/19/2019   LDH 146 11/12/2019   Lab Results  Component Value Date   TOTALPROTELP 6.6 11/19/2019    ALBUMINELP 3.4 11/19/2019   A1GS 0.3 11/19/2019   A2GS 0.7 11/19/2019   BETS 1.0 11/19/2019   GAMS 1.3 11/19/2019   MSPIKE 1.1 (H) 11/19/2019   SPEI Comment 11/19/2019    Lab Results  Component Value Date   KPAFRELGTCHN 32.5 (H) 11/19/2019   LAMBDASER 20.9 11/19/2019   KAPLAMBRATIO 1.56 11/19/2019    DIAGNOSTIC IMAGING:  I have independently reviewed the scans and discussed with the patient. No results found.   ASSESSMENT:  1. Normocytic anemia: -Patient seen at the request of Dr. Buelah Manis for further work-up and management of normocytic anemia. -Initial CBC showed hemoglobin 6.9 with MCV of 88.3. White count and platelets are normal. No history of CKD. Denies any bleeding per rectum or melena. Last colonoscopy on 04/01/2014 shows diffusely pigmented  rectal mucosa consistent with melanosis [name] Diffusely pigmented colonic mucosa, 2 diminutive polyps in the mid ascending segment, otherwise the remainder of the colon mucosa was normal. Biopsy consistent with tubular adenoma. -CT renal study on 05/11/2019 shows normal-sized spleen with normal liver. No other abnormal adenopathy. -Denies any prior history of blood transfusion. She is currently taking iron tablet twice daily.  2. IgG lambda plasma cell myeloma: -Skeletal survey on 09/12/2019 shows diffuse faint lucencies in the skull, bilateral femurs. -Bone marrow biopsy on 09/27/2019 shows 70% plasma cells in hypercellular marrow. Chromosome analysis was 28, XX. FISH panel was normal.  However quality of specimen is compromised. -LDH normal, beta-2 microglobulin 2.9. M spike was 2.1 g. Kappa light chains 19.7, lambda light chains 14.3 with ratio of 1.38. -24-hour urine shows nonnephrotic range proteinuria. Urine immunofixation was positive.   PLAN:  1.IgG lambda plasma cell myeloma: -She is tolerating Revlimid very well.  She is taking 15 mg 2 weeks on 1 week off. -She is scheduled to start  Revlimid today.  -She will receive Velcade injection today. Mild intermittent neuropathy in hands and feet. -Recommend follow-up in 2 weeks after myeloma panel next week.  2. Diabetes: -Continue glipizide and Metformin.  3. Hypertension: -Continue amlodipine and HCTZ.  4. Hypothyroidism: -Continue Synthroid daily.  5. B12 deficiency: -Continue B12 monthly injections.  6. Right posterior hip pain: -Continue hydrocodone 5/325 every 6 hours as needed.  7.  Infection prophylaxis: -Continue acyclovir 400 mg twice daily.  Continue aspirin 81 mg daily.  8.  Normocytic anemia: -Hemoglobin was 7.2 with MCV of 91 on 11/12/2019.  Likely from infiltration by myeloma cells. -Ferritin is 256, percent saturation is 23, folic acid and Y17 was normal. -Hemoglobin improving is 8.5 today.  9.  Dysphagia: -Unclear etiology. -Appears they have tried to call to get her scheduled for a swallow evaluation. A letter has been sent to her address for her to contact them.  10. Elevated creatinine: -Encouraged hydration. -We will recheck kidney function next week.   11.  Urinary tract infection: -Started on Bactrim last week.  Has 1 day of antibiotics to left to take.  Disposition: -Return to clinic in 1 week for labs and Velcade injection. -Start Revlimid 19m daily  today for 2 weeks. -Velcade injection today. -Return to clinic in 2 weeks for lab, Velcade and assessment   Greater than 50% was spent in counseling and coordination of care with this patient including but not limited to discussion of the relevant topics above (See A&P) including, but not limited to diagnosis and management of acute and chronic medical conditions.    Orders placed this encounter:  Orders Placed This Encounter  Procedures  . CBC with Differential/Platelet  . Basic metabolic panel   JFaythe Casa NP 12/10/2019 12:01 PM [phone]

## 2019-12-10 NOTE — Patient Instructions (Signed)
[facility] Discharge Instructions for Patients Receiving Chemotherapy   Beginning January 23rd 2017 lab work for the [facility] will be done in the  Main lab at [facility] on 1st floor. If you have a lab appointment with the [facility] please come in thru the  Main Entrance and check in at the main information desk   Today you received the following chemotherapy agents Velcade injection. Follow-up as scheduled  To help prevent nausea and vomiting after your treatment, we encourage you to take your nausea medication   If you develop nausea and vomiting, or diarrhea that is not controlled by your medication, call the clinic.  The clinic phone number is [phone]. Office hours are Monday-Friday 8:30am-5:00pm.  BELOW ARE SYMPTOMS THAT SHOULD BE REPORTED IMMEDIATELY:  *FEVER GREATER THAN 101.0 F  *CHILLS WITH OR WITHOUT FEVER  NAUSEA AND VOMITING THAT IS NOT CONTROLLED WITH YOUR NAUSEA MEDICATION  *UNUSUAL SHORTNESS OF BREATH  *UNUSUAL BRUISING OR BLEEDING  TENDERNESS IN MOUTH AND THROAT WITH OR WITHOUT PRESENCE OF ULCERS  *URINARY PROBLEMS  *BOWEL PROBLEMS  UNUSUAL RASH Items with * indicate a potential emergency and should be followed up as soon as possible. If you have an emergency after office hours please contact your primary care physician or go to the nearest emergency department.  Please call the clinic during office hours if you have any questions or concerns.   You may also contact the Patient Navigator at [phone] should you have any questions or need assistance in obtaining follow up care.      Resources For Cancer Patients and their Caregivers ? American Cancer Society: Can assist with transportation, wigs, general needs, runs Look Good Feel Better.        ([phone] ? Cancer Care: Provides financial assistance, online support groups, medication/co-pay assistance.  1-800-813-HOPE 819-442-9512) ? [place] Assists [place] cancer patients and their families through emotional , educational and financial support.  226-760-5399 ? [place] DSS Where to apply for food stamps, Medicaid and utility assistance. (505)582-5523 ? [facility]: Transportation to medical appointments. (703)744-5688 ? Social Security Administration: May apply for disability if have a Stage IV cancer. [phone] ? [employer], Disability and Transit Services: Assists with nutrition, care and transit needs. [phone]

## 2019-12-11 ENCOUNTER — Other Ambulatory Visit: Payer: Self-pay | Admitting: Oncology

## 2019-12-11 ENCOUNTER — Other Ambulatory Visit: Payer: Self-pay | Admitting: *Deleted

## 2019-12-11 ENCOUNTER — Other Ambulatory Visit (HOSPITAL_COMMUNITY): Payer: Self-pay

## 2019-12-11 DIAGNOSIS — E1143 Type 2 diabetes mellitus with diabetic autonomic (poly)neuropathy: Secondary | ICD-10-CM

## 2019-12-11 DIAGNOSIS — E039 Hypothyroidism, unspecified: Secondary | ICD-10-CM

## 2019-12-11 DIAGNOSIS — I10 Essential (primary) hypertension: Secondary | ICD-10-CM

## 2019-12-11 DIAGNOSIS — N1832 Chronic kidney disease, stage 3b: Secondary | ICD-10-CM

## 2019-12-11 DIAGNOSIS — E782 Mixed hyperlipidemia: Secondary | ICD-10-CM

## 2019-12-11 LAB — KAPPA/LAMBDA LIGHT CHAINS
Kappa free light chain: 44.8 mg/L — ABNORMAL HIGH (ref 3.3–19.4)
Kappa, lambda light chain ratio: 1.84 — ABNORMAL HIGH (ref 0.26–1.65)
Lambda free light chains: 24.3 mg/L (ref 5.7–26.3)

## 2019-12-11 MED ORDER — HYDROCODONE-ACETAMINOPHEN 5-325 MG PO TABS: 1.0000 | ORAL_TABLET | Freq: Four times a day (QID) | ORAL | 0 refills | Status: DC | PRN

## 2019-12-11 MED ORDER — TRAMADOL HCL 50 MG PO TABS
50.0000 mg | ORAL_TABLET | Freq: Four times a day (QID) | ORAL | 0 refills | Status: DC | PRN
Start: 2019-12-11 — End: 2020-02-11

## 2019-12-12 LAB — PROTEIN ELECTROPHORESIS, SERUM
A/G Ratio: 1.3 (ref 0.7–1.7)
Albumin ELP: 3.6 g/dL (ref 2.9–4.4)
Alpha-1-Globulin: 0.3 g/dL (ref 0.0–0.4)
Alpha-2-Globulin: 0.7 g/dL (ref 0.4–1.0)
Beta Globulin: 1 g/dL (ref 0.7–1.3)
Gamma Globulin: 0.9 g/dL (ref 0.4–1.8)
Globulin, Total: 2.8 g/dL (ref 2.2–3.9)
M-Spike, %: 0.5 g/dL — ABNORMAL HIGH
Total Protein ELP: 6.4 g/dL (ref 6.0–8.5)

## 2019-12-17 ENCOUNTER — Encounter (HOSPITAL_COMMUNITY): Payer: Self-pay

## 2019-12-17 ENCOUNTER — Inpatient Hospital Stay (HOSPITAL_COMMUNITY): Payer: Medicare Other

## 2019-12-17 ENCOUNTER — Other Ambulatory Visit: Payer: Self-pay

## 2019-12-17 VITALS — BP 155/59 | HR 74 | Temp 97.0°F | Resp 18

## 2019-12-17 DIAGNOSIS — C9000 Multiple myeloma not having achieved remission: Secondary | ICD-10-CM

## 2019-12-17 DIAGNOSIS — Z5112 Encounter for antineoplastic immunotherapy: Secondary | ICD-10-CM | POA: Diagnosis not present

## 2019-12-17 LAB — BASIC METABOLIC PANEL
Anion gap: 9 (ref 5–15)
BUN: 12 mg/dL (ref 8–23)
CO2: 24 mmol/L (ref 22–32)
Calcium: 8.8 mg/dL — ABNORMAL LOW (ref 8.9–10.3)
Chloride: 102 mmol/L (ref 98–111)
Creatinine, Ser: 0.9 mg/dL (ref 0.44–1.00)
GFR, Estimated: >60 mL/min (ref >60)
Glucose, Bld: 174 mg/dL — ABNORMAL HIGH (ref 70–99)
Potassium: 3.4 mmol/L — ABNORMAL LOW (ref 3.5–5.1)
Sodium: 135 mmol/L (ref 135–145)

## 2019-12-17 LAB — CBC WITH DIFFERENTIAL/PLATELET
Abs Immature Granulocytes: 0.13 10*3/uL — ABNORMAL HIGH (ref 0.00–0.07)
Band Neutrophils: 1 %
Basophils Absolute: 0.2 10*3/uL — ABNORMAL HIGH (ref 0.0–0.1)
Basophils Relative: 2 %
Blasts: 1 %
Eosinophils Absolute: 2.1 10*3/uL — ABNORMAL HIGH (ref 0.0–0.5)
Eosinophils Relative: 22 %
HCT: 30.5 % — ABNORMAL LOW (ref 36.0–46.0)
Hemoglobin: 9 g/dL — ABNORMAL LOW (ref 12.0–15.0)
Lymphocytes Relative: 31 %
Lymphs Abs: 3 10*3/uL (ref 0.7–4.0)
MCH: 28.4 pg (ref 26.0–34.0)
MCHC: 29.5 g/dL — ABNORMAL LOW (ref 30.0–36.0)
MCV: 96.2 fL (ref 80.0–100.0)
Monocytes Absolute: 0.3 10*3/uL (ref 0.1–1.0)
Monocytes Relative: 3 %
Neutro Abs: 3.9 10*3/uL (ref 1.7–7.7)
Neutrophils Relative %: 40 %
Platelets: 259 10*3/uL (ref 150–400)
RBC: 3.17 MIL/uL — ABNORMAL LOW (ref 3.87–5.11)
RDW: 22.5 % — ABNORMAL HIGH (ref 11.5–15.5)
WBC: 9.6 10*3/uL (ref 4.0–10.5)
nRBC: 0.6 % — ABNORMAL HIGH (ref 0.0–0.2)

## 2019-12-17 MED ORDER — BORTEZOMIB CHEMO SQ INJECTION 3.5 MG (2.5MG/ML)
1.3000 mg/m2 | Freq: Once | INTRAMUSCULAR | Status: AC
  Administered 2019-12-17: 2.25 mg via SUBCUTANEOUS
  Filled 2019-12-17: qty 0.9

## 2019-12-17 MED ORDER — DEXAMETHASONE 4 MG PO TABS
20.0000 mg | ORAL_TABLET | Freq: Once | ORAL | Status: AC
  Administered 2019-12-17: 20 mg via ORAL
  Filled 2019-12-17: qty 5

## 2019-12-17 NOTE — Patient Instructions (Signed)
[facility] Discharge Instructions for Patients Receiving Chemotherapy   Beginning January 23rd 2017 lab work for the [facility] will be done in the  Main lab at [facility] on 1st floor. If you have a lab appointment with the [facility] please come in thru the  Main Entrance and check in at the main information desk   Today you received the following chemotherapy agents Velcade injection. Follow-up as scheduled  To help prevent nausea and vomiting after your treatment, we encourage you to take your nausea medication   If you develop nausea and vomiting, or diarrhea that is not controlled by your medication, call the clinic.  The clinic phone number is [phone]. Office hours are Monday-Friday 8:30am-5:00pm.  BELOW ARE SYMPTOMS THAT SHOULD BE REPORTED IMMEDIATELY:  *FEVER GREATER THAN 101.0 F  *CHILLS WITH OR WITHOUT FEVER  NAUSEA AND VOMITING THAT IS NOT CONTROLLED WITH YOUR NAUSEA MEDICATION  *UNUSUAL SHORTNESS OF BREATH  *UNUSUAL BRUISING OR BLEEDING  TENDERNESS IN MOUTH AND THROAT WITH OR WITHOUT PRESENCE OF ULCERS  *URINARY PROBLEMS  *BOWEL PROBLEMS  UNUSUAL RASH Items with * indicate a potential emergency and should be followed up as soon as possible. If you have an emergency after office hours please contact your primary care physician or go to the nearest emergency department.  Please call the clinic during office hours if you have any questions or concerns.   You may also contact the Patient Navigator at [phone] should you have any questions or need assistance in obtaining follow up care.      Resources For Cancer Patients and their Caregivers ? American Cancer Society: Can assist with transportation, wigs, general needs, runs Look Good Feel Better.        ([phone] ? Cancer Care: Provides financial assistance, online support groups, medication/co-pay assistance.  1-800-813-HOPE 947-040-0157) ? [place] Assists [place] cancer patients and their families through emotional , educational and financial support.  574-639-5278 ? [place] DSS Where to apply for food stamps, Medicaid and utility assistance. 3656983461 ? [facility]: Transportation to medical appointments. 251-557-0052 ? Social Security Administration: May apply for disability if have a Stage IV cancer. 910-271-2367 403-051-7034 ? [employer], Disability and Transit Services: Assists with nutrition, care and transit needs. [phone]

## 2019-12-17 NOTE — Progress Notes (Signed)
Kim Mills BMET results reviewed with JBurns NP and pt approved for Velcade injection today per NP                [name] tolerated Velcade injection well without complaints or incident. VSS Pt discharged via wheelchair in satisfactory condition accompanied by family member

## 2019-12-18 ENCOUNTER — Ambulatory Visit (INDEPENDENT_AMBULATORY_CARE_PROVIDER_SITE_OTHER): Payer: Medicare Other | Admitting: Family Medicine

## 2019-12-18 ENCOUNTER — Encounter: Payer: Self-pay | Admitting: Family Medicine

## 2019-12-18 VITALS — BP 128/58 | HR 68 | Temp 98.3°F | Resp 14

## 2019-12-18 DIAGNOSIS — I2510 Atherosclerotic heart disease of native coronary artery without angina pectoris: Secondary | ICD-10-CM

## 2019-12-18 DIAGNOSIS — E039 Hypothyroidism, unspecified: Secondary | ICD-10-CM

## 2019-12-18 DIAGNOSIS — J209 Acute bronchitis, unspecified: Secondary | ICD-10-CM

## 2019-12-18 DIAGNOSIS — E1143 Type 2 diabetes mellitus with diabetic autonomic (poly)neuropathy: Secondary | ICD-10-CM | POA: Diagnosis not present

## 2019-12-18 DIAGNOSIS — J0100 Acute maxillary sinusitis, unspecified: Secondary | ICD-10-CM | POA: Diagnosis not present

## 2019-12-18 MED ORDER — FLUTICASONE PROPIONATE 50 MCG/ACT NA SUSP: 2.0000 | Freq: Every day | NASAL | 6 refills | Status: DC

## 2019-12-18 MED ORDER — BENZONATATE 100 MG PO CAPS: 100.0000 mg | ORAL_CAPSULE | Freq: Two times a day (BID) | ORAL | 0 refills | Status: DC | PRN

## 2019-12-18 MED ORDER — AZITHROMYCIN 250 MG PO TABS: ORAL_TABLET | ORAL | 0 refills | Status: DC

## 2019-12-18 NOTE — Progress Notes (Signed)
   Subjective:    Patient ID: Kim Mills, female    DOB: [birth year], 79 y.o.   MRN: [id number]  Patient presents for Cough (x1 week- productive cough with clear sputum, nasal congestion, no fever/SOB)  Pt here withh cough and congestion, sinus presure drainage for the past 1-2 weeks. She is getting chemo every week. Vaccines are UTD No N/V, no fever, no difficulty breathing, no diarrhea No known sick contacts Treated with [place], cough is keeping her up at night    Review Of Systems:  GEN- denies fatigue, fever, weight loss,weakness, recent illness HEENT- denies eye drainage, change in vision, +nasal discharge, CVS- denies chest pain, palpitations RESP- denies SOB, +cough, wheeze ABD- denies N/V, change in stools, abd pain GU- denies dysuria, hematuria, dribbling, incontinence MSK- denies joint pain, muscle aches, injury Neuro- denies headache, dizziness, syncope, seizure activity       Objective:    BP (!) 128/58   Pulse 68   Temp 98.3 F (36.8 C) (Temporal)   Resp 14   SpO2 97%  GEN- NAD, alert and oriented x3, sitting in wheelchair, non toxic appearing  HEENT- PERRL, EOMI, non injected sclera, pink conjunctiva, MMM, oropharynx clear, +sinus pressure, enlarged turbinates, clear rhinorrhea Neck- Supple, no LAD CVS- RRR, no murmur RESP-CTAB, no wheeze, normal WOB  ABD-NABS,soft,NT,ND EXT- No edema Pulses- Radial  2+        Assessment & Plan:      Problem List Items Addressed This Visit      Unprioritized   Hypothyroidism   Relevant Orders   TSH   Type 2 diabetes mellitus ([place])    Due for A1C, no change to meds, will have drawn with labs on Monday      Relevant Orders   Hemoglobin A1c    Other Visit Diagnoses    Acute bronchitis, unspecified organism    -  Primary   bronchitic with sinusitis, immunocompromised. No fever, normal sat. Add amoxocillin, tessalon, allergy med for drainage   Acute maxillary sinusitis, recurrence not specified        Relevant Medications   fluticasone (FLONASE) 50 MCG/ACT nasal spray   benzonatate (TESSALON PERLES) 100 MG capsule   azithromycin (ZITHROMAX) 250 MG tablet      Note: This dictation was prepared with Dragon dictation along with smaller phrase technology. Any transcriptional errors that result from this process are unintentional.

## 2019-12-18 NOTE — Patient Instructions (Signed)
Take antibiotics as prescribed Get A1C done with labs on Monday  Take antibiotics as prescribed

## 2019-12-19 ENCOUNTER — Encounter: Payer: Self-pay | Admitting: Family Medicine

## 2019-12-19 NOTE — Assessment & Plan Note (Signed)
Due for A1C, no change to meds, will have drawn with labs on Monday

## 2019-12-21 ENCOUNTER — Other Ambulatory Visit (HOSPITAL_COMMUNITY): Payer: Self-pay

## 2019-12-21 MED ORDER — LENALIDOMIDE 15 MG PO CAPS
15.0000 mg | ORAL_CAPSULE | Freq: Every day | ORAL | 0 refills | Status: DC
Start: 2019-12-21 — End: 2020-01-09

## 2019-12-24 ENCOUNTER — Inpatient Hospital Stay (HOSPITAL_COMMUNITY): Payer: Medicare Other

## 2019-12-24 ENCOUNTER — Inpatient Hospital Stay (HOSPITAL_BASED_OUTPATIENT_CLINIC_OR_DEPARTMENT_OTHER): Payer: Medicare Other | Admitting: Oncology

## 2019-12-24 ENCOUNTER — Other Ambulatory Visit: Payer: Self-pay

## 2019-12-24 VITALS — BP 148/55 | HR 71 | Temp 97.3°F | Resp 17 | Wt 176.3 lb

## 2019-12-24 DIAGNOSIS — C9000 Multiple myeloma not having achieved remission: Secondary | ICD-10-CM

## 2019-12-24 DIAGNOSIS — Z5112 Encounter for antineoplastic immunotherapy: Secondary | ICD-10-CM | POA: Diagnosis not present

## 2019-12-24 LAB — COMPREHENSIVE METABOLIC PANEL
ALT: 10 U/L (ref 0–44)
AST: 11 U/L — ABNORMAL LOW (ref 15–41)
Albumin: 3.6 g/dL (ref 3.5–5.0)
Alkaline Phosphatase: 70 U/L (ref 38–126)
Anion gap: 8 (ref 5–15)
BUN: 14 mg/dL (ref 8–23)
CO2: 27 mmol/L (ref 22–32)
Calcium: 8.8 mg/dL — ABNORMAL LOW (ref 8.9–10.3)
Chloride: 102 mmol/L (ref 98–111)
Creatinine, Ser: 1.02 mg/dL — ABNORMAL HIGH (ref 0.44–1.00)
GFR, Estimated: 57 mL/min — ABNORMAL LOW (ref >60)
Glucose, Bld: 161 mg/dL — ABNORMAL HIGH (ref 70–99)
Potassium: 3.2 mmol/L — ABNORMAL LOW (ref 3.5–5.1)
Sodium: 137 mmol/L (ref 135–145)
Total Bilirubin: 1.2 mg/dL (ref 0.3–1.2)
Total Protein: 6.6 g/dL (ref 6.5–8.1)

## 2019-12-24 LAB — CBC WITH DIFFERENTIAL/PLATELET
Band Neutrophils: 1 %
Basophils Absolute: 0 10*3/uL (ref 0.0–0.1)
Basophils Relative: 0 %
Blasts: 1 %
Eosinophils Absolute: 0.4 10*3/uL (ref 0.0–0.5)
Eosinophils Relative: 4 %
HCT: 31.9 % — ABNORMAL LOW (ref 36.0–46.0)
Hemoglobin: 9.4 g/dL — ABNORMAL LOW (ref 12.0–15.0)
Lymphocytes Relative: 41 %
Lymphs Abs: 4.5 10*3/uL — ABNORMAL HIGH (ref 0.7–4.0)
MCH: 28 pg (ref 26.0–34.0)
MCHC: 29.5 g/dL — ABNORMAL LOW (ref 30.0–36.0)
MCV: 94.9 fL (ref 80.0–100.0)
Metamyelocytes Relative: 4 %
Monocytes Absolute: 0.8 10*3/uL (ref 0.1–1.0)
Monocytes Relative: 7 %
Myelocytes: 2 %
Neutro Abs: 4.4 10*3/uL (ref 1.7–7.7)
Neutrophils Relative %: 39 %
Platelets: 207 10*3/uL (ref 150–400)
Promyelocytes Relative: 1 %
RBC: 3.36 MIL/uL — ABNORMAL LOW (ref 3.87–5.11)
RDW: 21.3 % — ABNORMAL HIGH (ref 11.5–15.5)
WBC: 10.9 10*3/uL — ABNORMAL HIGH (ref 4.0–10.5)
nRBC: 0.3 % — ABNORMAL HIGH (ref 0.0–0.2)

## 2019-12-24 MED ORDER — DEXAMETHASONE 4 MG PO TABS
20.0000 mg | ORAL_TABLET | Freq: Once | ORAL | Status: AC
  Administered 2019-12-24: 20 mg via ORAL
  Filled 2019-12-24: qty 5

## 2019-12-24 MED ORDER — BORTEZOMIB CHEMO SQ INJECTION 3.5 MG (2.5MG/ML)
1.3000 mg/m2 | Freq: Once | INTRAMUSCULAR | Status: AC
  Administered 2019-12-24: 2.25 mg via SUBCUTANEOUS
  Filled 2019-12-24: qty 0.9

## 2019-12-24 NOTE — Progress Notes (Signed)
Kim Mills presents today for injection per the provider's orders.  Velcade administration without incident; injection site WNL; see MAR for injection details.  Patient tolerated procedure well and without incident.  No questions or complaints noted at this time.  Discharged via wheelchair in c/o family member in stable condition.

## 2019-12-24 NOTE — Progress Notes (Signed)
[facility] [place], [place] [address]   CLINIC:  Medical Oncology/Hematology  PCP:  Alycia Rossetti, MD [address] [place] [address] [phone]   REASON FOR VISIT:  Follow-up for normocytic anemia and multiple myeloma  PRIOR THERAPY: None  NGS Results: Not done  CURRENT THERAPY: Bortezomib weekly; Revlimid 2 weeks on, 1 week off  BRIEF ONCOLOGIC HISTORY:  Oncology History  Multiple myeloma not having achieved remission ([facility])  10/13/2019 Initial Diagnosis   Multiple myeloma not having achieved remission ([facility])   10/29/2019 -  Chemotherapy   The patient had dexamethasone (DECADRON) tablet 20 mg, 20 mg (100 % of original dose 20 mg), Oral,  Once, 3 of 8 cycles Dose modification: 20 mg (original dose 20 mg, Cycle 1) Administration: 20 mg (10/29/2019), 20 mg (11/05/2019), 20 mg (11/12/2019), 20 mg (11/19/2019), 20 mg (11/26/2019), 20 mg (12/03/2019), 20 mg (12/10/2019), 20 mg (12/17/2019) bortezomib SQ (VELCADE) chemo injection 2.25 mg, 1.3 mg/m2 = 2.25 mg, Subcutaneous,  Once, 3 of 8 cycles Administration: 2.25 mg (10/29/2019), 2.25 mg (11/05/2019), 2.25 mg (11/12/2019), 2.25 mg (11/19/2019), 2.25 mg (11/26/2019), 2.25 mg (12/03/2019), 2.25 mg (12/10/2019)  for chemotherapy treatment.      CANCER STAGING: Cancer Staging No matching staging information was found for the patient.  INTERVAL HISTORY:  Ms. ASSYRIA MORREALE, a 79 y.o. female, returns for routine follow-up and consideration for next cycle of chemotherapy. Noha was last seen on 12/10/2019.   Due for day # 15 of cycle 3 of bortezomib today.   Today she is accompanied by her daughter.  Overall she states she has been doing good.  She started her 1 week off today.  She will restart her Revlimid next Sunday.  She recently was treated for an upper respiratory infection with azithromycin.  She continues to have coughing spells but feels improved.  She also recently was treated for UTI  and completed 5 days of Bactrim.  She continues to have dysphagia with liquids and foods.  They have a scheduled outpatient swallow study in about 2 weeks.    REVIEW OF SYSTEMS:  Review of Systems  HENT:   Positive for trouble swallowing.   Respiratory: Positive for cough and shortness of breath.   Gastrointestinal: Positive for constipation.  Genitourinary:        Recently treated for UTI.  Neurological: Positive for numbness.    PAST MEDICAL/SURGICAL HISTORY:  Past Medical History:  Diagnosis Date  . Arthritis   . Cataract   . Chest pain    Associated with weakness and fatigue  . Diabetes mellitus    A1c of 7.4 in 08/2010  . GERD (gastroesophageal reflux disease)   . Glaucoma   . Hyperlipidemia    Lipid profile in 08/2010:190, 121, 48, 118; normal CBC and CMet  . Hypertension    Lipid profile in 08/2010:190, 121, 48, 118; normal CBC and CMet  . Hypothyroidism   . Hypothyroidism   . Obesity    Past Surgical History:  Procedure Laterality Date  . CHOLECYSTECTOMY    . COLONOSCOPY N/A 04/01/2014   RMR: Melanosis coli. colonic polyps removed as described above.   . CYSTOSCOPY W/ URETERAL STENT PLACEMENT Right 09/13/2019   Procedure: CYSTOSCOPY WITH RIGHT  RETROGRADE PYELOGRAM; BLADDER BIOPSY;  Surgeon: Cleon Gustin, MD;  Location: [facility];  Service: Urology;  Laterality: Right;  . LEFT HEART CATH AND CORONARY ANGIOGRAPHY N/A 10/07/2017   Procedure: LEFT HEART CATH  AND CORONARY ANGIOGRAPHY;  Surgeon: Troy Sine, MD;  Location: [facility];  Service: Cardiovascular;  Laterality: N/A;  . THYROIDECTOMY, PARTIAL      SOCIAL HISTORY:  Social History   Socioeconomic History  . Marital status: Widowed    Spouse name: Not on file  . Number of children: 7  . Years of education: Not on file  . Highest education level: Not on file  Occupational History    Employer: RETIRED  Tobacco Use  . Smoking status: Passive Smoke Exposure - Never Smoker  . Smokeless tobacco:  Never Used  Vaping Use  . Vaping Use: Never used  Substance and Sexual Activity  . Alcohol use: Never  . Drug use: Never  . Sexual activity: Not Currently  Other Topics Concern  . Not on file  Social History Narrative  . Not on file   Social Determinants of Health   Financial Resource Strain:   . Difficulty of Paying Living Expenses: Not on file  Food Insecurity:   . Worried About Charity fundraiser in the Last Year: Not on file  . Ran Out of Food in the Last Year: Not on file  Transportation Needs: No Transportation Needs  . Lack of Transportation (Medical): No  . Lack of Transportation (Non-Medical): No  Physical Activity: Inactive  . Days of Exercise per Week: 0 days  . Minutes of Exercise per Session: 0 min  Stress:   . Feeling of Stress : Not on file  Social Connections:   . Frequency of Communication with Friends and Family: Not on file  . Frequency of Social Gatherings with Friends and Family: Not on file  . Attends Religious Services: Not on file  . Active Member of Clubs or Organizations: Not on file  . Attends Archivist Meetings: Not on file  . Marital Status: Not on file  Intimate Partner Violence: Not At Risk  . Fear of Current or Ex-Partner: No  . Emotionally Abused: No  . Physically Abused: No  . Sexually Abused: No    FAMILY HISTORY:  Family History  Problem Relation Age of Onset  . Anemia Father   . Arthritis Mother   . Stroke Brother   . Cancer Sister        unknown kind  . Diabetes Sister   . Dementia Sister   . Cancer Niece        Breast  . Cancer Brother   . Healthy Son   . Healthy Son   . Healthy Son   . Healthy Daughter   . Healthy Daughter   . Healthy Daughter   . Sarcoidosis Daughter     CURRENT MEDICATIONS:  Current Outpatient Medications  Medication Sig Dispense Refill  . acyclovir (ZOVIRAX) 400 MG tablet Take 1 tablet (400 mg total) by mouth 2 (two) times daily. 60 tablet 6  . amLODipine (NORVASC) 10 MG tablet  Take 1 tablet (10 mg total) by mouth daily. 90 tablet 3  . ASPIRIN 81 PO Take 1 tablet by mouth daily.    Marland Kitchen azithromycin (ZITHROMAX) 250 MG tablet Take 2 tablets x 1 day, then 1 tab daily for 4 days 6 tablet 0  . benzonatate (TESSALON PERLES) 100 MG capsule Take 1 capsule (100 mg total) by mouth 2 (two) times daily as needed for cough. 20 capsule 0  . Blood Glucose Monitoring Suppl (BLOOD GLUCOSE SYSTEM PAK) KIT Please dispense based on patient and insurance preference. Use as directed to monitor  FSBS 2x daily. Dx: E11.9. 1 each 1  . fluticasone (FLONASE) 50 MCG/ACT nasal spray Place 2 sprays into both nostrils daily. 16 g 6  . gabapentin (NEURONTIN) 300 MG capsule Take 1 capsule (300 mg total) by mouth at bedtime. 90 capsule 3  . glipiZIDE (GLUCOTROL) 5 MG tablet Take 1 tablet (5 mg total) by mouth 2 (two) times daily. 180 tablet 3  . Glucose Blood (BLOOD GLUCOSE TEST STRIPS) STRP Please dispense based on patient and insurance preference. Use as directed to monitor FSBS 2x daily. Dx: E11.9. 100 each 11  . HM LORATADINE 10 MG tablet TAKE ONE TABLET ($RemoveBef'10MG'GukIHcapLg$  TOTAL) BY MOUTH DAILY 30 tablet 0  . hydrochlorothiazide (MICROZIDE) 12.5 MG capsule TAKE ONE CAPSULE BY MOUTH DAILY (Patient taking differently: Take 12.5 mg by mouth daily. ) 90 capsule 3  . HYDROcodone-acetaminophen (NORCO/VICODIN) 5-325 MG tablet Take 1 tablet by mouth every 6 (six) hours as needed for moderate pain. 60 tablet 0  . Lancets MISC Please dispense based on patient and insurance preference. Use as directed to monitor FSBS 2x daily. Dx: E11.9. 100 each 11  . lenalidomide (REVLIMID) 15 MG capsule Take 1 capsule (15 mg total) by mouth daily. 14 capsule 0  . meclizine (ANTIVERT) 25 MG tablet Take 25 mg by mouth every 6 (six) hours as needed for dizziness.     . metFORMIN (GLUCOPHAGE) 1000 MG tablet TAKE ONE TABLET BY MOUTH TWICE A DAY 60 tablet 3  . metoprolol tartrate (LOPRESSOR) 25 MG tablet Take 0.5 tablets (12.5 mg total) by mouth 2  (two) times daily. 90 tablet 3  . omeprazole (PRILOSEC) 40 MG capsule TAKE ONE CAPSULE BY MOUTH DAILY (Patient taking differently: Take 40 mg by mouth daily. ) 90 capsule 3  . prochlorperazine (COMPAZINE) 10 MG tablet Take 1 tablet (10 mg total) by mouth every 6 (six) hours as needed (Nausea or vomiting). 30 tablet 1  . simvastatin (ZOCOR) 40 MG tablet TAKE ONE TABLET ($RemoveBef'40MG'Ittwyawszc$  TOTAL) BY MOUTH BEDTIME (Patient taking differently: Take 40 mg by mouth at bedtime. ) 90 tablet 3  . SYNTHROID 137 MCG tablet Take 137 mcg by mouth every morning.    . tolterodine (DETROL LA) 4 MG 24 hr capsule TAKE ONE CAPSULE BY MOUTH DAILY (Patient taking differently: Take 4 mg by mouth daily. ) 90 capsule 3  . traMADol (ULTRAM) 50 MG tablet Take 1 tablet (50 mg total) by mouth every 6 (six) hours as needed. 30 tablet 0   No current facility-administered medications for this visit.    ALLERGIES:  No Known Allergies  PHYSICAL EXAM:  Performance status (ECOG): 2 - Symptomatic, <50% confined to bed  Vitals:   12/24/19 1202  BP: (!) 148/55  Pulse: 71  Resp: 17  Temp: (!) 97.3 F (36.3 C)  SpO2: 99%   Wt Readings from Last 3 Encounters:  12/24/19 176 lb 4.8 oz (80 kg)  12/10/19 186 lb 11.2 oz (84.7 kg)  11/26/19 181 lb 7 oz (82.3 kg)   Physical Exam Constitutional:      Appearance: Normal appearance.  HENT:     Head: Normocephalic and atraumatic.  Eyes:     Pupils: Pupils are equal, round, and reactive to light.  Cardiovascular:     Rate and Rhythm: Normal rate and regular rhythm.     Heart sounds: Normal heart sounds. No murmur heard.   Pulmonary:     Effort: Pulmonary effort is normal.     Breath sounds: Normal breath sounds. No  wheezing.  Abdominal:     General: Bowel sounds are normal. There is no distension.     Palpations: Abdomen is soft.     Tenderness: There is no abdominal tenderness.  Musculoskeletal:        General: Normal range of motion.     Cervical back: Normal range of motion.   Skin:    General: Skin is warm and dry.     Findings: No rash.  Neurological:     Mental Status: She is alert and oriented to person, place, and time.  Psychiatric:        Judgment: Judgment normal.     LABORATORY DATA:  I have reviewed the labs as listed.  CBC Latest Ref Rng & Units 12/24/2019 12/17/2019 12/10/2019  WBC 4.0 - 10.5 K/uL 10.9(H) 9.6 10.0  Hemoglobin 12.0 - 15.0 g/dL 9.4(L) 9.0(L) 8.5(L)  Hematocrit 36 - 46 % 31.9(L) 30.5(L) 28.8(L)  Platelets 150 - 400 K/uL 207 259 234   CMP Latest Ref Rng & Units 12/24/2019 12/17/2019 12/10/2019  Glucose 70 - 99 mg/dL 161(H) 174(H) 142(H)  BUN 8 - 23 mg/dL _0 Creatinine 0.44 - 1.00 mg/dL 1.02(H) 0.90 1.17(H)  Sodium 135 - 145 mmol/L 137 135 136  Potassium 3.5 - 5.1 mmol/L 3.2(L) 3.4(L) 3.9  Chloride 98 - 111 mmol/L 102 102 104  CO2 22 - 32 mmol/L _1 Calcium 8.9 - 10.3 mg/dL 8.8(L) 8.8(L) 9.0  Total Protein 6.5 - 8.1 g/dL 6.6 - 6.7  Total Bilirubin 0.3 - 1.2 mg/dL 1.2 - 0.8  Alkaline Phos 38 - 126 U/L 70 - 72  AST 15 - 41 U/L 11(L) - 10(L)  ALT 0 - 44 U/L 10 - 10   Lab Results  Component Value Date   LDH 148 12/10/2019   LDH 164 11/19/2019   LDH 146 11/12/2019   Lab Results  Component Value Date   TOTALPROTELP 6.4 12/10/2019   ALBUMINELP 3.6 12/10/2019   A1GS 0.3 12/10/2019   A2GS 0.7 12/10/2019   BETS 1.0 12/10/2019   GAMS 0.9 12/10/2019   MSPIKE 0.5 (H) 12/10/2019   SPEI Comment 12/10/2019    Lab Results  Component Value Date   KPAFRELGTCHN 44.8 (H) 12/10/2019   LAMBDASER 24.3 12/10/2019   KAPLAMBRATIO 1.84 (H) 12/10/2019    DIAGNOSTIC IMAGING:  I have independently reviewed the scans and discussed with the patient. No results found.   ASSESSMENT:  1. Normocytic anemia: -Patient seen at the request of Dr. Buelah Manis for further work-up and management of normocytic anemia. -Initial CBC showed hemoglobin 6.9 with MCV of 88.3. White count and platelets are normal. No history of CKD. Denies  any bleeding per rectum or melena. Last colonoscopy on 04/01/2014 shows diffusely pigmented rectal mucosa consistent with melanosis [name] Diffusely pigmented colonic mucosa, 2 diminutive polyps in the mid ascending segment, otherwise the remainder of the colon mucosa was normal. Biopsy consistent with tubular adenoma. -CT renal study on 05/11/2019 shows normal-sized spleen with normal liver. No other abnormal adenopathy. -Denies any prior history of blood transfusion. She is currently taking iron tablet twice daily.  2. IgG lambda plasma cell myeloma: -Skeletal survey on 09/12/2019 shows diffuse faint lucencies in the skull, bilateral femurs. -Bone marrow biopsy on 09/27/2019 shows 70% plasma cells in hypercellular marrow. Chromosome analysis was 53, XX. FISH panel was normal.  However quality of specimen is compromised. -LDH normal, beta-2 microglobulin 2.9. M spike was 2.1 g. Kappa light chains 19.7,  lambda light chains 14.3 with ratio of 1.38. -24-hour urine shows nonnephrotic range proteinuria. Urine immunofixation was positive.   PLAN:  1.IgG lambda plasma cell myeloma: -She is tolerating Revlimid very well.  She is taking 15 mg 2 weeks on 1 week off. -She started her week off this morning. -She will receive Velcade injection today. Mild intermittent neuropathy in hands and feet. -Recommend follow-up in 4 weeks prior to next cycle of RVD.  2. Diabetes: -Continue glipizide and Metformin.  3. Hypertension: -Continue amlodipine and HCTZ.  4. Hypothyroidism: -Continue Synthroid daily.  5. B12 deficiency: -Continue B12 monthly injections.  6. Right posterior hip pain: -Continue hydrocodone 5/325 every 6 hours as needed.  7.  Infection prophylaxis: -Continue acyclovir 400 mg twice daily.  Continue aspirin 81 mg daily.  8.  Normocytic anemia: -Hemoglobin was 7.2 with MCV of 91 on 11/12/2019.  Likely from infiltration by myeloma  cells. -Ferritin is 256, percent saturation is 23, folic acid and U27 was normal. -Hemoglobin continues to improve and is 9.4.  9.  Dysphagia: -Unclear etiology. -Has outpatient swallow evaluation scheduled for 01/15/2020.  10. Elevated creatinine: -Improved. -Continue hydration.  12.  Upper respiratory infection: -Treated by Dr. Buelah Manis on 12/18/2019 for sinusitis and bronchitis. -She has completed Zithromax. -Feels she is improving -Might account for slight bump in white count today.  Disposition: -Return to clinic weekly for Velcade injection. -On week off.  Will restart Revlimid in on Sunday. -Velcade injection today. -Return to clinic in 4 weeks for lab, Velcade and assessment   Greater than 50% was spent in counseling and coordination of care with this patient including but not limited to discussion of the relevant topics above (See A&P) including, but not limited to diagnosis and management of acute and chronic medical conditions.    Orders placed this encounter:  No orders of the defined types were placed in this encounter.  Faythe Casa, NP 12/24/2019 12:18 PM [place] [phone]

## 2019-12-25 ENCOUNTER — Encounter (HOSPITAL_COMMUNITY): Payer: Medicare Other | Admitting: Speech Pathology

## 2019-12-28 ENCOUNTER — Other Ambulatory Visit (HOSPITAL_COMMUNITY): Payer: Self-pay | Admitting: Oncology

## 2019-12-28 MED ORDER — POTASSIUM CHLORIDE CRYS ER 20 MEQ PO TBCR: 20.0000 meq | EXTENDED_RELEASE_TABLET | Freq: Two times a day (BID) | ORAL | 0 refills | Status: DC

## 2019-12-28 NOTE — Progress Notes (Signed)
Re: Hypokalemia  Potassium incidentally noted to be 3.2 at her visit.   RX 20 mEq twice daily x5 days.  We will recheck her labs at her next visit.  Faythe Casa, NP 12/28/2019 11:28 AM

## 2019-12-31 ENCOUNTER — Inpatient Hospital Stay (HOSPITAL_COMMUNITY): Payer: Medicare Other

## 2019-12-31 ENCOUNTER — Other Ambulatory Visit: Payer: Self-pay

## 2019-12-31 VITALS — BP 142/66 | HR 71 | Temp 97.0°F | Resp 16 | Wt 178.8 lb

## 2019-12-31 DIAGNOSIS — Z5112 Encounter for antineoplastic immunotherapy: Secondary | ICD-10-CM | POA: Diagnosis not present

## 2019-12-31 DIAGNOSIS — C9000 Multiple myeloma not having achieved remission: Secondary | ICD-10-CM

## 2019-12-31 LAB — COMPREHENSIVE METABOLIC PANEL
ALT: 8 U/L (ref 0–44)
AST: 11 U/L — ABNORMAL LOW (ref 15–41)
Albumin: 3.5 g/dL (ref 3.5–5.0)
Alkaline Phosphatase: 69 U/L (ref 38–126)
Anion gap: 7 (ref 5–15)
BUN: 13 mg/dL (ref 8–23)
CO2: 27 mmol/L (ref 22–32)
Calcium: 8.8 mg/dL — ABNORMAL LOW (ref 8.9–10.3)
Chloride: 105 mmol/L (ref 98–111)
Creatinine, Ser: 0.8 mg/dL (ref 0.44–1.00)
GFR, Estimated: >60 mL/min (ref >60)
Glucose, Bld: 221 mg/dL — ABNORMAL HIGH (ref 70–99)
Potassium: 3.8 mmol/L (ref 3.5–5.1)
Sodium: 139 mmol/L (ref 135–145)
Total Bilirubin: 0.7 mg/dL (ref 0.3–1.2)
Total Protein: 6.7 g/dL (ref 6.5–8.1)

## 2019-12-31 LAB — CBC WITH DIFFERENTIAL/PLATELET
Abs Immature Granulocytes: 0.07 10*3/uL (ref 0.00–0.07)
Basophils Absolute: 0.1 10*3/uL (ref 0.0–0.1)
Basophils Relative: 1 %
Eosinophils Absolute: 0.6 10*3/uL — ABNORMAL HIGH (ref 0.0–0.5)
Eosinophils Relative: 7 %
HCT: 29.9 % — ABNORMAL LOW (ref 36.0–46.0)
Hemoglobin: 9 g/dL — ABNORMAL LOW (ref 12.0–15.0)
Immature Granulocytes: 1 %
Lymphocytes Relative: 38 %
Lymphs Abs: 3.5 10*3/uL (ref 0.7–4.0)
MCH: 28.8 pg (ref 26.0–34.0)
MCHC: 30.1 g/dL (ref 30.0–36.0)
MCV: 95.8 fL (ref 80.0–100.0)
Monocytes Absolute: 1 10*3/uL (ref 0.1–1.0)
Monocytes Relative: 10 %
Neutro Abs: 3.9 10*3/uL (ref 1.7–7.7)
Neutrophils Relative %: 43 %
Platelets: 215 10*3/uL (ref 150–400)
RBC: 3.12 MIL/uL — ABNORMAL LOW (ref 3.87–5.11)
RDW: 20.5 % — ABNORMAL HIGH (ref 11.5–15.5)
WBC: 9.2 10*3/uL (ref 4.0–10.5)
nRBC: 0 % (ref 0.0–0.2)

## 2019-12-31 MED ORDER — BORTEZOMIB CHEMO SQ INJECTION 3.5 MG (2.5MG/ML)
1.3000 mg/m2 | Freq: Once | INTRAMUSCULAR | Status: AC
  Administered 2019-12-31: 2.25 mg via SUBCUTANEOUS
  Filled 2019-12-31: qty 0.9

## 2019-12-31 MED ORDER — DEXAMETHASONE 4 MG PO TABS
20.0000 mg | ORAL_TABLET | Freq: Once | ORAL | Status: AC
  Administered 2019-12-31: 20 mg via ORAL
  Filled 2019-12-31: qty 5

## 2019-12-31 NOTE — Progress Notes (Signed)
[place] presents today for D1C4 Velcade. Pt denies any new changes or symptoms since last treatment. Lab results and vitals have been reviewed and are stable and within parameters for treatment. Proceeding with treatment today as planned.  Injection tolerated without incident or complaint. Discharged in satisfactory condition with follow up instructions.

## 2019-12-31 NOTE — Patient Instructions (Signed)
[facility] Discharge Instructions for Patients Receiving Chemotherapy   Beginning January 23rd 2017 lab work for the [facility] will be done in the  Main lab at [facility] on 1st floor. If you have a lab appointment with the [facility] please come in thru the  Main Entrance and check in at the main information desk   Today you received the following chemotherapy agents Velcade  To help prevent nausea and vomiting after your treatment, we encourage you to take your nausea medication   If you develop nausea and vomiting, or diarrhea that is not controlled by your medication, call the clinic.  The clinic phone number is [phone]. Office hours are Monday-Friday 8:30am-5:00pm.  BELOW ARE SYMPTOMS THAT SHOULD BE REPORTED IMMEDIATELY:  *FEVER GREATER THAN 101.0 F  *CHILLS WITH OR WITHOUT FEVER  NAUSEA AND VOMITING THAT IS NOT CONTROLLED WITH YOUR NAUSEA MEDICATION  *UNUSUAL SHORTNESS OF BREATH  *UNUSUAL BRUISING OR BLEEDING  TENDERNESS IN MOUTH AND THROAT WITH OR WITHOUT PRESENCE OF ULCERS  *URINARY PROBLEMS  *BOWEL PROBLEMS  UNUSUAL RASH Items with * indicate a potential emergency and should be followed up as soon as possible. If you have an emergency after office hours please contact your primary care physician or go to the nearest emergency department.  Please call the clinic during office hours if you have any questions or concerns.   You may also contact the Patient Navigator at [phone] should you have any questions or need assistance in obtaining follow up care.      Resources For Cancer Patients and their Caregivers ? American Cancer Society: Can assist with transportation, wigs, general needs, runs Look Good Feel Better.        915-814-6132 ? Cancer Care: Provides financial assistance, online support groups, medication/co-pay assistance.  1-800-813-HOPE 671 618 8200) ? [place] Assists [place] cancer  patients and their families through emotional , educational and financial support.  313-754-1170 ? [place] DSS Where to apply for food stamps, Medicaid and utility assistance. 231-328-6962 ? [facility]: Transportation to medical appointments. (347)138-5365 ? Social Security Administration: May apply for disability if have a Stage IV cancer. (206)405-0506 (541)872-8677 ? [employer], Disability and Transit Services: Assists with nutrition, care and transit needs. [phone]

## 2020-01-07 ENCOUNTER — Other Ambulatory Visit (HOSPITAL_COMMUNITY): Payer: Self-pay | Admitting: Surgery

## 2020-01-07 ENCOUNTER — Inpatient Hospital Stay (HOSPITAL_COMMUNITY): Payer: Medicare Other

## 2020-01-07 ENCOUNTER — Encounter (HOSPITAL_COMMUNITY): Payer: Self-pay

## 2020-01-07 ENCOUNTER — Other Ambulatory Visit (HOSPITAL_COMMUNITY): Payer: Self-pay | Admitting: Oncology

## 2020-01-07 ENCOUNTER — Other Ambulatory Visit: Payer: Self-pay

## 2020-01-07 VITALS — BP 142/53 | HR 76 | Temp 97.2°F | Resp 18 | Wt 177.4 lb

## 2020-01-07 DIAGNOSIS — I471 Supraventricular tachycardia, unspecified: Secondary | ICD-10-CM

## 2020-01-07 DIAGNOSIS — Z5112 Encounter for antineoplastic immunotherapy: Secondary | ICD-10-CM | POA: Diagnosis not present

## 2020-01-07 DIAGNOSIS — R079 Chest pain, unspecified: Secondary | ICD-10-CM

## 2020-01-07 DIAGNOSIS — C9000 Multiple myeloma not having achieved remission: Secondary | ICD-10-CM | POA: Diagnosis not present

## 2020-01-07 LAB — CBC WITH DIFFERENTIAL/PLATELET
Abs Immature Granulocytes: 0.13 10*3/uL — ABNORMAL HIGH (ref 0.00–0.07)
Basophils Absolute: 0.1 10*3/uL (ref 0.0–0.1)
Basophils Relative: 1 %
Eosinophils Absolute: 0.7 10*3/uL — ABNORMAL HIGH (ref 0.0–0.5)
Eosinophils Relative: 7 %
HCT: 33 % — ABNORMAL LOW (ref 36.0–46.0)
Hemoglobin: 9.7 g/dL — ABNORMAL LOW (ref 12.0–15.0)
Immature Granulocytes: 1 %
Lymphocytes Relative: 31 %
Lymphs Abs: 3.1 10*3/uL (ref 0.7–4.0)
MCH: 28 pg (ref 26.0–34.0)
MCHC: 29.4 g/dL — ABNORMAL LOW (ref 30.0–36.0)
MCV: 95.1 fL (ref 80.0–100.0)
Monocytes Absolute: 0.4 10*3/uL (ref 0.1–1.0)
Monocytes Relative: 4 %
Neutro Abs: 5.6 10*3/uL (ref 1.7–7.7)
Neutrophils Relative %: 56 %
Platelets: 283 10*3/uL (ref 150–400)
RBC: 3.47 MIL/uL — ABNORMAL LOW (ref 3.87–5.11)
RDW: 20.9 % — ABNORMAL HIGH (ref 11.5–15.5)
WBC: 10 10*3/uL (ref 4.0–10.5)
nRBC: 0.3 % — ABNORMAL HIGH (ref 0.0–0.2)

## 2020-01-07 LAB — COMPREHENSIVE METABOLIC PANEL
ALT: 12 U/L (ref 0–44)
AST: 13 U/L — ABNORMAL LOW (ref 15–41)
Albumin: 3.7 g/dL (ref 3.5–5.0)
Alkaline Phosphatase: 71 U/L (ref 38–126)
Anion gap: 8 (ref 5–15)
BUN: 13 mg/dL (ref 8–23)
CO2: 26 mmol/L (ref 22–32)
Calcium: 9 mg/dL (ref 8.9–10.3)
Chloride: 103 mmol/L (ref 98–111)
Creatinine, Ser: 0.9 mg/dL (ref 0.44–1.00)
GFR, Estimated: >60 mL/min (ref >60)
Glucose, Bld: 205 mg/dL — ABNORMAL HIGH (ref 70–99)
Potassium: 3.7 mmol/L (ref 3.5–5.1)
Sodium: 137 mmol/L (ref 135–145)
Total Bilirubin: 0.6 mg/dL (ref 0.3–1.2)
Total Protein: 6.8 g/dL (ref 6.5–8.1)

## 2020-01-07 LAB — TROPONIN I (HIGH SENSITIVITY): Troponin I (High Sensitivity): 10 ng/L (ref <18)

## 2020-01-07 LAB — LACTATE DEHYDROGENASE: LDH: 155 U/L (ref 98–192)

## 2020-01-07 MED ORDER — DEXAMETHASONE 4 MG PO TABS
20.0000 mg | ORAL_TABLET | Freq: Once | ORAL | Status: AC
  Administered 2020-01-07: 20 mg via ORAL
  Filled 2020-01-07: qty 5

## 2020-01-07 MED ORDER — BORTEZOMIB CHEMO SQ INJECTION 3.5 MG (2.5MG/ML)
1.3000 mg/m2 | Freq: Once | INTRAMUSCULAR | Status: AC
  Administered 2020-01-07: 2.25 mg via SUBCUTANEOUS
  Filled 2020-01-07: qty 0.9

## 2020-01-07 NOTE — Progress Notes (Signed)
To treatment room for velcade.  Per daughter the patient is having "racing heart" and "chest pains" prn that are new since starting the Revlimid.  She is on week 2 of Revlimid.   Patient questioned if she was having acid reflux and the patient stated she takes a pill and it makes it "feel better".  Patient denied SOB, pain to jaw or arm.  Also, noted white patches on right lower gums and the patient stated her mouth feels funny when she takes her pill (revlimid per the daughter).  No s/s of distress noted.  Reviewed with Dr. Delton Coombes with labs and ekg verbal order.   Patient to have a magic mouth wash sent by the NP and patient and family notified with understanding verbalized.   Troponin lab and CK reviewed with Faythe Casa, NP, with verbal order ok to treat with velcade today.   Patient tolerated Velcade injection with no complaints voiced.  Lab work reviewed.  See MAR for details.  Injection site clean and dry with no bruising or swelling noted.  Band aid applied.  VSS.  Patient left in satisfactory condition with no s/s of distress noted.

## 2020-01-08 ENCOUNTER — Other Ambulatory Visit (HOSPITAL_COMMUNITY): Payer: Self-pay

## 2020-01-08 LAB — KAPPA/LAMBDA LIGHT CHAINS
Kappa free light chain: 41 mg/L — ABNORMAL HIGH (ref 3.3–19.4)
Kappa, lambda light chain ratio: 1.69 — ABNORMAL HIGH (ref 0.26–1.65)
Lambda free light chains: 24.2 mg/L (ref 5.7–26.3)

## 2020-01-08 LAB — PROTEIN ELECTROPHORESIS, SERUM
A/G Ratio: 1.3 (ref 0.7–1.7)
Albumin ELP: 3.4 g/dL (ref 2.9–4.4)
Alpha-1-Globulin: 0.2 g/dL (ref 0.0–0.4)
Alpha-2-Globulin: 0.8 g/dL (ref 0.4–1.0)
Beta Globulin: 1 g/dL (ref 0.7–1.3)
Gamma Globulin: 0.7 g/dL (ref 0.4–1.8)
Globulin, Total: 2.7 g/dL (ref 2.2–3.9)
M-Spike, %: 0.3 g/dL — ABNORMAL HIGH
Total Protein ELP: 6.1 g/dL (ref 6.0–8.5)

## 2020-01-08 LAB — CK TOTAL AND CKMB (NOT AT ARMC)
CK, MB: 1.3 ng/mL (ref 0.5–5.0)
Relative Index: INVALID (ref 0.0–2.5)
Total CK: 45 U/L (ref 38–234)

## 2020-01-08 LAB — IMMUNOFIXATION ELECTROPHORESIS
IgA: 246 mg/dL (ref 64–422)
IgG (Immunoglobin G), Serum: 802 mg/dL (ref 586–1602)
IgM (Immunoglobulin M), Srm: 18 mg/dL — ABNORMAL LOW (ref 26–217)
Total Protein ELP: 6.3 g/dL (ref 6.0–8.5)

## 2020-01-08 MED ORDER — MAGIC MOUTHWASH W/LIDOCAINE: 5.0000 mL | Freq: Four times a day (QID) | ORAL | 0 refills | Status: DC

## 2020-01-09 ENCOUNTER — Other Ambulatory Visit (HOSPITAL_COMMUNITY): Payer: Self-pay

## 2020-01-09 MED ORDER — LENALIDOMIDE 15 MG PO CAPS
15.0000 mg | ORAL_CAPSULE | Freq: Every day | ORAL | 0 refills | Status: DC
Start: 2020-01-09 — End: 2020-01-29

## 2020-01-09 NOTE — Telephone Encounter (Signed)
Chart reviewed. Revlimid refilled per Faythe Casa, NP last office visit note.

## 2020-01-14 ENCOUNTER — Inpatient Hospital Stay (HOSPITAL_COMMUNITY): Payer: Medicare Other | Attending: Hematology and Oncology

## 2020-01-14 ENCOUNTER — Inpatient Hospital Stay (HOSPITAL_COMMUNITY): Payer: Medicare Other

## 2020-01-14 ENCOUNTER — Encounter (HOSPITAL_COMMUNITY): Payer: Self-pay

## 2020-01-14 ENCOUNTER — Other Ambulatory Visit (HOSPITAL_COMMUNITY): Payer: Self-pay | Admitting: Specialist

## 2020-01-14 ENCOUNTER — Other Ambulatory Visit: Payer: Self-pay

## 2020-01-14 VITALS — BP 126/52 | HR 63 | Temp 99.0°F | Resp 18

## 2020-01-14 DIAGNOSIS — C9000 Multiple myeloma not having achieved remission: Secondary | ICD-10-CM | POA: Diagnosis not present

## 2020-01-14 DIAGNOSIS — Z5112 Encounter for antineoplastic immunotherapy: Secondary | ICD-10-CM | POA: Insufficient documentation

## 2020-01-14 DIAGNOSIS — R1319 Other dysphagia: Secondary | ICD-10-CM

## 2020-01-14 LAB — COMPREHENSIVE METABOLIC PANEL
ALT: 10 U/L (ref 0–44)
AST: 14 U/L — ABNORMAL LOW (ref 15–41)
Albumin: 3.5 g/dL (ref 3.5–5.0)
Alkaline Phosphatase: 69 U/L (ref 38–126)
Anion gap: 9 (ref 5–15)
BUN: 14 mg/dL (ref 8–23)
CO2: 27 mmol/L (ref 22–32)
Calcium: 8.7 mg/dL — ABNORMAL LOW (ref 8.9–10.3)
Chloride: 102 mmol/L (ref 98–111)
Creatinine, Ser: 0.99 mg/dL (ref 0.44–1.00)
GFR, Estimated: 59 mL/min — ABNORMAL LOW (ref >60)
Glucose, Bld: 224 mg/dL — ABNORMAL HIGH (ref 70–99)
Potassium: 3.4 mmol/L — ABNORMAL LOW (ref 3.5–5.1)
Sodium: 138 mmol/L (ref 135–145)
Total Bilirubin: 0.9 mg/dL (ref 0.3–1.2)
Total Protein: 6.4 g/dL — ABNORMAL LOW (ref 6.5–8.1)

## 2020-01-14 LAB — CBC WITH DIFFERENTIAL/PLATELET
Abs Immature Granulocytes: 0.12 10*3/uL — ABNORMAL HIGH (ref 0.00–0.07)
Basophils Absolute: 0 10*3/uL (ref 0.0–0.1)
Basophils Relative: 1 %
Eosinophils Absolute: 0.5 10*3/uL (ref 0.0–0.5)
Eosinophils Relative: 6 %
HCT: 31.9 % — ABNORMAL LOW (ref 36.0–46.0)
Hemoglobin: 9.8 g/dL — ABNORMAL LOW (ref 12.0–15.0)
Immature Granulocytes: 1 %
Lymphocytes Relative: 30 %
Lymphs Abs: 2.5 10*3/uL (ref 0.7–4.0)
MCH: 29.2 pg (ref 26.0–34.0)
MCHC: 30.7 g/dL (ref 30.0–36.0)
MCV: 94.9 fL (ref 80.0–100.0)
Monocytes Absolute: 0.8 10*3/uL (ref 0.1–1.0)
Monocytes Relative: 9 %
Neutro Abs: 4.5 10*3/uL (ref 1.7–7.7)
Neutrophils Relative %: 53 %
Platelets: 197 10*3/uL (ref 150–400)
RBC: 3.36 MIL/uL — ABNORMAL LOW (ref 3.87–5.11)
RDW: 19.9 % — ABNORMAL HIGH (ref 11.5–15.5)
WBC: 8.4 10*3/uL (ref 4.0–10.5)
nRBC: 0 % (ref 0.0–0.2)

## 2020-01-14 MED ORDER — DEXAMETHASONE 4 MG PO TABS
20.0000 mg | ORAL_TABLET | Freq: Once | ORAL | Status: AC
  Administered 2020-01-14: 20 mg via ORAL
  Filled 2020-01-14: qty 5

## 2020-01-14 MED ORDER — BORTEZOMIB CHEMO SQ INJECTION 3.5 MG (2.5MG/ML)
1.3000 mg/m2 | Freq: Once | INTRAMUSCULAR | Status: AC
  Administered 2020-01-14: 2.25 mg via SUBCUTANEOUS
  Filled 2020-01-14: qty 0.9

## 2020-01-14 NOTE — Patient Instructions (Signed)
[facility] Discharge Instructions for Patients Receiving Chemotherapy   Beginning January 23rd 2017 lab work for the [facility] will be done in the  Main lab at [facility] on 1st floor. If you have a lab appointment with the [facility] please come in thru the  Main Entrance and check in at the main information desk   Today you received the following chemotherapy agents Velcade. Follow-up as scheduled  To help prevent nausea and vomiting after your treatment, we encourage you to take your nausea medication   If you develop nausea and vomiting, or diarrhea that is not controlled by your medication, call the clinic.  The clinic phone number is [phone]. Office hours are Monday-Friday 8:30am-5:00pm.  BELOW ARE SYMPTOMS THAT SHOULD BE REPORTED IMMEDIATELY:  *FEVER GREATER THAN 101.0 F  *CHILLS WITH OR WITHOUT FEVER  NAUSEA AND VOMITING THAT IS NOT CONTROLLED WITH YOUR NAUSEA MEDICATION  *UNUSUAL SHORTNESS OF BREATH  *UNUSUAL BRUISING OR BLEEDING  TENDERNESS IN MOUTH AND THROAT WITH OR WITHOUT PRESENCE OF ULCERS  *URINARY PROBLEMS  *BOWEL PROBLEMS  UNUSUAL RASH Items with * indicate a potential emergency and should be followed up as soon as possible. If you have an emergency after office hours please contact your primary care physician or go to the nearest emergency department.  Please call the clinic during office hours if you have any questions or concerns.   You may also contact the Patient Navigator at [phone] should you have any questions or need assistance in obtaining follow up care.      Resources For Cancer Patients and their Caregivers ? American Cancer Society: Can assist with transportation, wigs, general needs, runs Look Good Feel Better.        3853261117 ? Cancer Care: Provides financial assistance, online support groups, medication/co-pay assistance.  1-800-813-HOPE 669-559-1665) ? [place] Assists [place] cancer patients and their families through emotional , educational and financial support.  3202382422 ? [place] DSS Where to apply for food stamps, Medicaid and utility assistance. 5120590732 ? [facility]: Transportation to medical appointments. 9732815355 ? Social Security Administration: May apply for disability if have a Stage IV cancer. [phone] 2727309531 ? [employer], Disability and Transit Services: Assists with nutrition, care and transit needs. [phone]

## 2020-01-14 NOTE — Progress Notes (Signed)
Kim Mills tolerated Velcade injection well without complaints or incident. Labs reviewed prior to administering this medication. VSS Pt discharged via wheelchair in satisfactory condition accompanied by her daughter

## 2020-01-15 ENCOUNTER — Ambulatory Visit (HOSPITAL_COMMUNITY): Payer: Medicare Other | Attending: Oncology | Admitting: Speech Pathology

## 2020-01-15 ENCOUNTER — Ambulatory Visit (HOSPITAL_COMMUNITY)
Admission: RE | Admit: 2020-01-15 | Discharge: 2020-01-15 | Disposition: A | Discharge status: Home / Self Care | Payer: Medicare Other | Source: Ambulatory Visit | Attending: Oncology | Admitting: Oncology

## 2020-01-15 ENCOUNTER — Encounter (HOSPITAL_COMMUNITY): Payer: Self-pay | Admitting: Speech Pathology

## 2020-01-15 DIAGNOSIS — R1319 Other dysphagia: Secondary | ICD-10-CM | POA: Diagnosis not present

## 2020-01-15 DIAGNOSIS — R1312 Dysphagia, oropharyngeal phase: Secondary | ICD-10-CM

## 2020-01-15 NOTE — Therapy (Signed)
[facility] [address] [place], [place], [address] Phone: [phone]   Fax:  [phone]  Modified Barium Swallow  Patient Details  Name: Kim Mills MRN: [id number] Date of Birth: May 06, 1940 No data recorded  Encounter Date: 01/15/2020   End of Session - 01/15/20 1742    Visit Number 1    Number of Visits 1    Authorization Type Medicare    SLP Start Time 1330    SLP Stop Time  5374    SLP Time Calculation (min) 28 min    Activity Tolerance Patient tolerated treatment well           Past Medical History:  Diagnosis Date  . Arthritis   . Cataract   . Chest pain    Associated with weakness and fatigue  . Diabetes mellitus    A1c of 7.4 in 08/2010  . GERD (gastroesophageal reflux disease)   . Glaucoma   . Hyperlipidemia    Lipid profile in 08/2010:190, 121, 48, 118; normal CBC and CMet  . Hypertension    Lipid profile in 08/2010:190, 121, 48, 118; normal CBC and CMet  . Hypothyroidism   . Hypothyroidism   . Obesity     Past Surgical History:  Procedure Laterality Date  . CHOLECYSTECTOMY    . COLONOSCOPY N/A 04/01/2014   RMR: Melanosis coli. colonic polyps removed as described above.   . CYSTOSCOPY W/ URETERAL STENT PLACEMENT Right 09/13/2019   Procedure: CYSTOSCOPY WITH RIGHT  RETROGRADE PYELOGRAM; BLADDER BIOPSY;  Surgeon: Cleon Gustin, MD;  Location: [facility];  Service: Urology;  Laterality: Right;  . LEFT HEART CATH AND CORONARY ANGIOGRAPHY N/A 10/07/2017   Procedure: LEFT HEART CATH AND CORONARY ANGIOGRAPHY;  Surgeon: Troy Sine, MD;  Location: [facility];  Service: Cardiovascular;  Laterality: N/A;  . THYROIDECTOMY, PARTIAL      There were no vitals filed for this visit.   Subjective Assessment - 01/15/20 1735    Subjective "Sometimes I can get choked on my saliva."    Special Tests MBSS    Currently in Pain? No/denies               General - 01/15/20 1736      General  Information   Date of Onset 01/09/20    HPI Kim Mills is a 79 yo female who was referred for MBSS by Faythe Casa, NP (Oncology) due to Pt reports of difficulty swallowing for the past year.    Type of Study MBS-Modified Barium Swallow Study    Diet Prior to this Study Regular;Thin liquids    Temperature Spikes Noted No    Respiratory Status Room air    History of Recent Intubation No    Behavior/Cognition Alert;Cooperative;Pleasant mood    Oral Cavity Assessment Within Functional Limits    Oral Care Completed by SLP No    Oral Cavity - Dentition Adequate natural dentition    Vision Functional for self feeding    Self-Feeding Abilities Able to feed self    Patient Positioning Upright in chair    Baseline Vocal Quality Normal    Volitional Cough Strong    Volitional Swallow Able to elicit    Anatomy Within functional limits    Pharyngeal Secretions Not observed secondary MBS              Oral Preparation/Oral Phase - 01/15/20 1738      Oral Preparation/Oral Phase   Oral Phase Impaired  Oral - Solids   Oral - Pill Absent A-P transit   unable to propel pill to posterior oral cavity with water or puree     Electrical stimulation - Oral Phase   Was Electrical Stimulation Used No            Pharyngeal Phase - 01/15/20 1739      Pharyngeal Phase   Pharyngeal Phase Impaired      Pharyngeal - Thin   Pharyngeal- Thin Teaspoon Within functional limits;Pharyngeal residue - valleculae;Pharyngeal residue - pyriform   trace residuals, clear with dry swallow   Pharyngeal- Thin Cup Swallow initiation at pyriform sinus;Delayed swallow initiation;Penetration/Aspiration before swallow;Pharyngeal residue - valleculae;Pharyngeal residue - pyriform    Pharyngeal Material does not enter airway;Material enters airway, remains ABOVE vocal cords then ejected out    Pharyngeal- Thin Straw Swallow initiation at pyriform sinus      Pharyngeal - Solids   Pharyngeal- Puree Swallow  initiation at vallecula    Pharyngeal- Regular Delayed swallow initiation-vallecula    Pharyngeal- Pill Not tested      Electrical Stimulation - Pharyngeal Phase   Was Electrical Stimulation Used No            Cricopharyngeal Phase - 01/15/20 1741      Cervical Esophageal Phase   Cervical Esophageal Phase Within functional limits              Plan - 01/15/20 1742    Clinical Impression Statement Pt presents with mild oral phase and normal for age pharyngeal phase characterized by difficulty with oral anterior posterior transit of barium tablet, which was presented in both puree and with thin liquids. Pt had to expectorate the pill. Pharyngeal phase is marked by min delay in swallow initiation with swallow trigger at the pyriforms with liquids resulting in flash penetration without aspiration and trace to min residuals in the valleculae and pyriforms. These residuals were cleared with a dry swallow (sometimes she required a verbal cue to clear). Pt does report an element of xerostomia, which can negatively impact oral phase of swallow (difficult to trigger swallow). Pt indicates that she has never had an easy time swallowing pills, but feels that it is more difficult now. Recommend regular textures and thin liquids, swallow 2x for each sip to clear pharynx, and try po medications whole in puree and/or crush as able. No further SLP services indicated at this time. Pt and daughter are in agreement with plan of care.    Consulted and Agree with Plan of Care Patient           Patient will benefit from skilled therapeutic intervention in order to improve the following deficits and impairments:   Dysphagia, oropharyngeal phase     Recommendations/Treatment - 01/15/20 1741      Swallow Evaluation Recommendations   SLP Diet Recommendations Thin;Age appropriate regular    Liquid Administration via Cup;Straw    Medication Administration Whole meds with puree    Supervision Patient able  to self feed    Compensations Multiple dry swallows after each bite/sip    Postural Changes Seated upright at 90 degrees;Remain upright for at least 30 minutes after feeds/meals            Prognosis - 01/15/20 1741      Prognosis   Prognosis for Safe Diet Advancement Good      Individuals Consulted   Consulted and Agree with Results and Recommendations Patient;Family member/caregiver    Family Member Consulted daughter  Report Sent to  Referring physician           Problem List Patient Active Problem List   Diagnosis Date Noted  . Multiple myeloma ([place]) 10/13/2019  . Goals of care, counseling/discussion 10/13/2019  . Multiple myeloma not having achieved remission ([facility]) 10/13/2019  . Monoclonal gammopathy 09/12/2019  . Gross hematuria 07/02/2019  . CKD (chronic kidney disease) stage 3, GFR 30-59 ml/min ([facility]) 04/18/2019  . Vitamin D deficiency 04/17/2019  . B12 deficiency 04/12/2018  . Normocytic anemia 04/12/2018  . Carotid artery disease without cerebral infarction ([place]) 04/12/2018  . Gait disorder 01/11/2018  . Diabetic neuropathy ([place]) 01/10/2018  . Bilateral carotid bruits 11/28/2017  . CAD (coronary artery disease) 11/23/2017  . Elevated troponin   . SVT (supraventricular tachycardia) ([place]) 10/05/2017  . History of colonic polyps   . Encounter for screening colonoscopy 03/13/2014  . Constipation 03/13/2014  . GERD (gastroesophageal reflux disease) 03/13/2014  . Hyperlipidemia   . Type 2 diabetes mellitus ([place])   . OA (osteoarthritis) of knee   . Hypothyroidism   . Chest pain   . Obesity   . Hypertension    Thank you,  Genene Churn, [place]  [facility] 01/15/2020, 5:46 PM  [place], [place], [address] Phone: [phone]   Fax:  [phone]  Name: Kim Mills MRN: [id number] Date of Birth: April 02, 1940

## 2020-01-20 NOTE — Progress Notes (Deleted)
[place], [place] [address]   CLINIC:  Medical Oncology/Hematology  PCP:  Alycia Rossetti, MD [address] [place] [address] [phone]   REASON FOR VISIT:  Follow-up for normocytic anemia and multiple myeloma  PRIOR THERAPY: None  NGS Results: Not done  CURRENT THERAPY: Bortezomib weekly; Revlimid 2 weeks on, 1 week off  BRIEF ONCOLOGIC HISTORY:  Oncology History  Multiple myeloma not having achieved remission ([place])  10/13/2019 Initial Diagnosis   Multiple myeloma not having achieved remission ([place])   10/29/2019 -  Chemotherapy   The patient had dexamethasone (DECADRON) tablet 20 mg, 20 mg (100 % of original dose 20 mg), Oral,  Once, 4 of 8 cycles Dose modification: 20 mg (original dose 20 mg, Cycle 1) Administration: 20 mg (10/29/2019), 20 mg (11/05/2019), 20 mg (11/12/2019), 20 mg (11/19/2019), 20 mg (11/26/2019), 20 mg (12/03/2019), 20 mg (12/10/2019), 20 mg (12/17/2019), 20 mg (12/24/2019), 20 mg (12/31/2019), 20 mg (01/07/2020), 20 mg (01/14/2020) bortezomib SQ (VELCADE) chemo injection 2.25 mg, 1.3 mg/m2 = 2.25 mg, Subcutaneous,  Once, 4 of 8 cycles Administration: 2.25 mg (10/29/2019), 2.25 mg (11/05/2019), 2.25 mg (11/12/2019), 2.25 mg (11/19/2019), 2.25 mg (11/26/2019), 2.25 mg (12/03/2019), 2.25 mg (12/10/2019), 2.25 mg (12/17/2019), 2.25 mg (12/24/2019), 2.25 mg (12/31/2019), 2.25 mg (01/07/2020)  for chemotherapy treatment.      CANCER STAGING: Cancer Staging No matching staging information was found for the patient.  INTERVAL HISTORY:  [name], a 79 y.o. female, returns for routine follow-up and consideration for next cycle of chemotherapy.   Due for day # 15 of cycle 3 of bortezomib today.      REVIEW OF SYSTEMS:  Review of Systems  HENT:   Positive for trouble swallowing.   Respiratory: Positive for cough and shortness of breath.   Gastrointestinal: Positive for constipation.  Genitourinary:         Recently treated for UTI.  Neurological: Positive for numbness.    PAST MEDICAL/SURGICAL HISTORY:  Past Medical History:  Diagnosis Date  . Arthritis   . Cataract   . Chest pain    Associated with weakness and fatigue  . Diabetes mellitus    A1c of 7.4 in 08/2010  . GERD (gastroesophageal reflux disease)   . Glaucoma   . Hyperlipidemia    Lipid profile in 08/2010:190, 121, 48, 118; normal CBC and CMet  . Hypertension    Lipid profile in 08/2010:190, 121, 48, 118; normal CBC and CMet  . Hypothyroidism   . Hypothyroidism   . Obesity    Past Surgical History:  Procedure Laterality Date  . CHOLECYSTECTOMY    . COLONOSCOPY N/A 04/01/2014   RMR: Melanosis coli. colonic polyps removed as described above.   . CYSTOSCOPY W/ URETERAL STENT PLACEMENT Right 09/13/2019   Procedure: CYSTOSCOPY WITH RIGHT  RETROGRADE PYELOGRAM; BLADDER BIOPSY;  Surgeon: Cleon Gustin, MD;  Location: [facility];  Service: Urology;  Laterality: Right;  . LEFT HEART CATH AND CORONARY ANGIOGRAPHY N/A 10/07/2017   Procedure: LEFT HEART CATH AND CORONARY ANGIOGRAPHY;  Surgeon: Troy Sine, MD;  Location: [facility];  Service: Cardiovascular;  Laterality: N/A;  . THYROIDECTOMY, PARTIAL      SOCIAL HISTORY:  Social History   Socioeconomic History  . Marital status: Widowed    Spouse name: Not on file  . Number of children: 7  . Years of education: Not on file  . Highest education level: Not  on file  Occupational History    Employer: RETIRED  Tobacco Use  . Smoking status: Passive Smoke Exposure - Never Smoker  . Smokeless tobacco: Never Used  Vaping Use  . Vaping Use: Never used  Substance and Sexual Activity  . Alcohol use: Never  . Drug use: Never  . Sexual activity: Not Currently  Other Topics Concern  . Not on file  Social History Narrative  . Not on file   Social Determinants of Health   Financial Resource Strain: Not on file  Food Insecurity: Not on file  Transportation  Needs: No Transportation Needs  . Lack of Transportation (Medical): No  . Lack of Transportation (Non-Medical): No  Physical Activity: Inactive  . Days of Exercise per Week: 0 days  . Minutes of Exercise per Session: 0 min  Stress: Not on file  Social Connections: Not on file  Intimate Partner Violence: Not At Risk  . Fear of Current or Ex-Partner: No  . Emotionally Abused: No  . Physically Abused: No  . Sexually Abused: No    FAMILY HISTORY:  Family History  Problem Relation Age of Onset  . Anemia Father   . Arthritis Mother   . Stroke Brother   . Cancer Sister        unknown kind  . Diabetes Sister   . Dementia Sister   . Cancer Niece        Breast  . Cancer Brother   . Healthy Son   . Healthy Son   . Healthy Son   . Healthy Daughter   . Healthy Daughter   . Healthy Daughter   . Sarcoidosis Daughter     CURRENT MEDICATIONS:  Current Outpatient Medications  Medication Sig Dispense Refill  . acyclovir (ZOVIRAX) 400 MG tablet Take 1 tablet (400 mg total) by mouth 2 (two) times daily. 60 tablet 6  . amLODipine (NORVASC) 10 MG tablet Take 1 tablet (10 mg total) by mouth daily. 90 tablet 3  . ASPIRIN 81 PO Take 1 tablet by mouth daily.    Marland Kitchen azithromycin (ZITHROMAX) 250 MG tablet Take 2 tablets x 1 day, then 1 tab daily for 4 days 6 tablet 0  . benzonatate (TESSALON PERLES) 100 MG capsule Take 1 capsule (100 mg total) by mouth 2 (two) times daily as needed for cough. 20 capsule 0  . Blood Glucose Monitoring Suppl (BLOOD GLUCOSE SYSTEM PAK) KIT Please dispense based on patient and insurance preference. Use as directed to monitor FSBS 2x daily. Dx: E11.9. 1 each 1  . fluticasone (FLONASE) 50 MCG/ACT nasal spray Place 2 sprays into both nostrils daily. 16 g 6  . gabapentin (NEURONTIN) 300 MG capsule Take 1 capsule (300 mg total) by mouth at bedtime. 90 capsule 3  . glipiZIDE (GLUCOTROL) 5 MG tablet Take 1 tablet (5 mg total) by mouth 2 (two) times daily. 180 tablet 3  .  Glucose Blood (BLOOD GLUCOSE TEST STRIPS) STRP Please dispense based on patient and insurance preference. Use as directed to monitor FSBS 2x daily. Dx: E11.9. 100 each 11  . HM LORATADINE 10 MG tablet TAKE ONE TABLET (10MG TOTAL) BY MOUTH DAILY 30 tablet 0  . hydrochlorothiazide (MICROZIDE) 12.5 MG capsule TAKE ONE CAPSULE BY MOUTH DAILY (Patient taking differently: Take 12.5 mg by mouth daily. ) 90 capsule 3  . HYDROcodone-acetaminophen (NORCO/VICODIN) 5-325 MG tablet Take 1 tablet by mouth every 6 (six) hours as needed for moderate pain. 60 tablet 0  . Lancets MISC Please dispense  based on patient and insurance preference. Use as directed to monitor FSBS 2x daily. Dx: E11.9. 100 each 11  . lenalidomide (REVLIMID) 15 MG capsule Take 1 capsule (15 mg total) by mouth daily. 14 capsule 0  . magic mouthwash w/lidocaine SOLN Take 5 mLs by mouth 4 (four) times daily. 450 mL 0  . meclizine (ANTIVERT) 25 MG tablet Take 25 mg by mouth every 6 (six) hours as needed for dizziness.     . metFORMIN (GLUCOPHAGE) 1000 MG tablet TAKE ONE TABLET BY MOUTH TWICE A DAY 60 tablet 3  . metoprolol tartrate (LOPRESSOR) 25 MG tablet Take 0.5 tablets (12.5 mg total) by mouth 2 (two) times daily. 90 tablet 3  . omeprazole (PRILOSEC) 40 MG capsule TAKE ONE CAPSULE BY MOUTH DAILY (Patient taking differently: Take 40 mg by mouth daily. ) 90 capsule 3  . potassium chloride SA (KLOR-CON) 20 MEQ tablet Take 1 tablet (20 mEq total) by mouth 2 (two) times daily. 10 tablet 0  . prochlorperazine (COMPAZINE) 10 MG tablet Take 1 tablet (10 mg total) by mouth every 6 (six) hours as needed (Nausea or vomiting). 30 tablet 1  . simvastatin (ZOCOR) 40 MG tablet TAKE ONE TABLET (40MG TOTAL) BY MOUTH BEDTIME (Patient taking differently: Take 40 mg by mouth at bedtime. ) 90 tablet 3  . SYNTHROID 137 MCG tablet Take 137 mcg by mouth every morning.    . tolterodine (DETROL LA) 4 MG 24 hr capsule TAKE ONE CAPSULE BY MOUTH DAILY (Patient taking  differently: Take 4 mg by mouth daily. ) 90 capsule 3  . traMADol (ULTRAM) 50 MG tablet Take 1 tablet (50 mg total) by mouth every 6 (six) hours as needed. 30 tablet 0   No current facility-administered medications for this visit.    ALLERGIES:  No Known Allergies  PHYSICAL EXAM:  Performance status (ECOG): 2 - Symptomatic, <50% confined to bed  There were no vitals filed for this visit. Wt Readings from Last 3 Encounters:  01/07/20 177 lb 6.4 oz (80.5 kg)  12/31/19 178 lb 12.8 oz (81.1 kg)  12/24/19 176 lb 4.8 oz (80 kg)   Physical Exam Constitutional:      Appearance: Normal appearance.  HENT:     Head: Normocephalic and atraumatic.  Eyes:     Pupils: Pupils are equal, round, and reactive to light.  Cardiovascular:     Rate and Rhythm: Normal rate and regular rhythm.     Heart sounds: Normal heart sounds. No murmur heard.   Pulmonary:     Effort: Pulmonary effort is normal.     Breath sounds: Normal breath sounds. No wheezing.  Abdominal:     General: Bowel sounds are normal. There is no distension.     Palpations: Abdomen is soft.     Tenderness: There is no abdominal tenderness.  Musculoskeletal:        General: Normal range of motion.     Cervical back: Normal range of motion.  Skin:    General: Skin is warm and dry.     Findings: No rash.  Neurological:     Mental Status: She is alert and oriented to person, place, and time.  Psychiatric:        Judgment: Judgment normal.     LABORATORY DATA:  I have reviewed the labs as listed.  CBC Latest Ref Rng & Units 01/14/2020 01/07/2020 12/31/2019  WBC 4.0 - 10.5 K/uL 8.4 10.0 9.2  Hemoglobin 12.0 - 15.0 g/dL 9.8(L) 9.7(L) 9.0(L)  Hematocrit 36.0 - 46.0 % 31.9(L) 33.0(L) 29.9(L)  Platelets 150 - 400 K/uL 197 283 215   CMP Latest Ref Rng & Units 01/14/2020 01/07/2020 12/31/2019  Glucose 70 - 99 mg/dL 224(H) 205(H) 221(H)  BUN 8 - 23 mg/dL 14 13 13   Creatinine 0.44 - 1.00 mg/dL 0.99 0.90 0.80  Sodium 135 - 145  mmol/L 138 137 139  Potassium 3.5 - 5.1 mmol/L 3.4(L) 3.7 3.8  Chloride 98 - 111 mmol/L 102 103 105  CO2 22 - 32 mmol/L 27 26 27   Calcium 8.9 - 10.3 mg/dL 8.7(L) 9.0 8.8(L)  Total Protein 6.5 - 8.1 g/dL 6.4(L) 6.8 6.7  Total Bilirubin 0.3 - 1.2 mg/dL 0.9 0.6 0.7  Alkaline Phos 38 - 126 U/L 69 71 69  AST 15 - 41 U/L 14(L) 13(L) 11(L)  ALT 0 - 44 U/L 10 12 8    Lab Results  Component Value Date   LDH 155 01/07/2020   LDH 148 12/10/2019   LDH 164 11/19/2019   Lab Results  Component Value Date   TOTALPROTELP 6.1 01/07/2020   ALBUMINELP 3.4 01/07/2020   A1GS 0.2 01/07/2020   A2GS 0.8 01/07/2020   BETS 1.0 01/07/2020   GAMS 0.7 01/07/2020   MSPIKE 0.3 (H) 01/07/2020   SPEI Comment 01/07/2020    Lab Results  Component Value Date   KPAFRELGTCHN 41.0 (H) 01/07/2020   LAMBDASER 24.2 01/07/2020   KAPLAMBRATIO 1.69 (H) 01/07/2020    DIAGNOSTIC IMAGING:  I have independently reviewed the scans and discussed with the patient. DG OP Swallowing Func-Medicare/Speech Path  Result Date: 01/16/2020 [facility] [address] [place], [place], [address] Phone: [phone]   Fax:  [phone]  Modified Barium Swallow  Patient Details Name: Kim Mills MRN: [id number] Date of Birth: August 22, 1940 No data recorded  Encounter Date: 01/15/2020        End of Session - 01/15/20 1742        Visit Number 1    Number of Visits 1    Authorization Type Medicare    SLP Start Time 1330    SLP Stop Time  0100    SLP Time Calculation (min) 28 min    Activity Tolerance Patient tolerated treatment well            Past Medical History: Diagnosis Date . Arthritis   . Cataract   . Chest pain     Associated with weakness and fatigue . Diabetes mellitus     A1c of 7.4 in 08/2010 . GERD (gastroesophageal reflux disease)   . Glaucoma   . Hyperlipidemia     Lipid profile in 08/2010:190, 121, 48, 118; normal CBC and CMet . Hypertension     Lipid profile in 08/2010:190, 121, 48, 118; normal CBC and  CMet . Hypothyroidism   . Hypothyroidism   . Obesity         Past Surgical History: Procedure Laterality Date . CHOLECYSTECTOMY     . COLONOSCOPY N/A 04/01/2014   RMR: Melanosis coli. colonic polyps removed as described above.  . CYSTOSCOPY W/ URETERAL STENT PLACEMENT Right 09/13/2019   Procedure: CYSTOSCOPY WITH RIGHT  RETROGRADE PYELOGRAM; BLADDER BIOPSY;  Surgeon: Cleon Gustin, MD;  Location: [facility];  Service: Urology;  Laterality: Right; . LEFT HEART CATH AND CORONARY ANGIOGRAPHY N/A 10/07/2017   Procedure: LEFT HEART CATH AND CORONARY ANGIOGRAPHY;  Surgeon: Troy Sine, MD;  Location: [facility];  Service: Cardiovascular;  Laterality: N/A; . THYROIDECTOMY, PARTIAL  There were no vitals filed for this visit.        Subjective Assessment - 01/15/20 1735        Subjective "Sometimes I can get choked on my saliva."    Special Tests MBSS    Currently in Pain? No/denies                General - 01/15/20 1736          General Information   Date of Onset 01/09/20    HPI Janisha Bueso is a 79 yo female who was referred for MBSS by Faythe Casa, NP (Oncology) due to Pt reports of difficulty swallowing for the past year.    Type of Study MBS-Modified Barium Swallow Study    Diet Prior to this Study Regular;Thin liquids    Temperature Spikes Noted No    Respiratory Status Room air    History of Recent Intubation No    Behavior/Cognition Alert;Cooperative;Pleasant mood    Oral Cavity Assessment Within Functional Limits    Oral Care Completed by SLP No    Oral Cavity - Dentition Adequate natural dentition    Vision Functional for self feeding    Self-Feeding Abilities Able to feed self    Patient Positioning Upright in chair    Baseline Vocal Quality Normal    Volitional Cough Strong    Volitional Swallow Able to elicit    Anatomy Within functional limits    Pharyngeal Secretions Not observed secondary MBS                Oral Preparation/Oral Phase - 01/15/20 1738          Oral Preparation/Oral Phase    Oral Phase Impaired      Oral - Solids   Oral - Pill Absent A-P transit untitled image  unable to propel pill to posterior oral cavity with water or puree     Electrical stimulation - Oral Phase   Was Electrical Stimulation Used No               Pharyngeal Phase - 01/15/20 1739          Pharyngeal Phase   Pharyngeal Phase Impaired      Pharyngeal - Thin   Pharyngeal- Thin Teaspoon Within functional limits;Pharyngeal residue - valleculae;Pharyngeal residue - pyriform untitled image  trace residuals, clear with dry swallow   Pharyngeal- Thin Cup Swallow initiation at pyriform sinus;Delayed swallow initiation;Penetration/Aspiration before swallow;Pharyngeal residue - valleculae;Pharyngeal residue - pyriform    Pharyngeal Material does not enter airway;Material enters airway, remains ABOVE vocal cords then ejected out    Pharyngeal- Thin Straw Swallow initiation at pyriform sinus      Pharyngeal - Solids   Pharyngeal- Puree Swallow initiation at vallecula    Pharyngeal- Regular Delayed swallow initiation-vallecula    Pharyngeal- Pill Not tested      Electrical Stimulation - Pharyngeal Phase   Was Electrical Stimulation Used No               Cricopharyngeal Phase - 01/15/20 1741          Cervical Esophageal Phase   Cervical Esophageal Phase Within functional limits                Plan - 01/15/20 1742        Clinical Impression Statement Pt presents with mild oral phase and normal for age pharyngeal phase characterized by difficulty with oral anterior posterior transit of barium tablet, which was presented in both  puree and with thin liquids. Pt had to expectorate the pill. Pharyngeal phase is marked by min delay in swallow initiation with swallow trigger at the pyriforms with liquids resulting in flash penetration without aspiration and trace to min residuals in the valleculae and pyriforms. These residuals were cleared with a dry swallow (sometimes she required a verbal cue to clear). Pt does report an element of  xerostomia, which can negatively impact oral phase of swallow (difficult to trigger swallow). Pt indicates that she has never had an easy time swallowing pills, but feels that it is more difficult now. Recommend regular textures and thin liquids, swallow 2x for each sip to clear pharynx, and try po medications whole in puree and/or crush as able. No further SLP services indicated at this time. Pt and daughter are in agreement with plan of care.    Consulted and Agree with Plan of Care Patient         Patient will benefit from skilled therapeutic intervention in order to improve the following deficits and impairments:  Dysphagia, oropharyngeal phase         Recommendations/Treatment - 01/15/20 1741          Swallow Evaluation Recommendations   SLP Diet Recommendations Thin;Age appropriate regular    Liquid Administration via Cup;Straw    Medication Administration Whole meds with puree    Supervision Patient able to self feed    Compensations Multiple dry swallows after each bite/sip    Postural Changes Seated upright at 90 degrees;Remain upright for at least 30 minutes after feeds/meals               Prognosis - 01/15/20 1741          Prognosis   Prognosis for Safe Diet Advancement Good      Individuals Consulted   Consulted and Agree with Results and Recommendations Patient;Family member/caregiver    Family Member Consulted daughter    Report Sent to  Referring physician         Problem List    Patient Active Problem List   Diagnosis Date Noted . Multiple myeloma ([place]) 10/13/2019 . Goals of care, counseling/discussion 10/13/2019 . Multiple myeloma not having achieved remission ([place]) 10/13/2019 . Monoclonal gammopathy 09/12/2019 . Gross hematuria 07/02/2019 . CKD (chronic kidney disease) stage 3, GFR 30-59 ml/min ([facility]) 04/18/2019 . Vitamin D deficiency 04/17/2019 . B12 deficiency 04/12/2018 . Normocytic anemia 04/12/2018 . Carotid artery disease without cerebral infarction ([place]) 04/12/2018 . Gait disorder 01/11/2018  . Diabetic neuropathy ([place]) 01/10/2018 . Bilateral carotid bruits 11/28/2017 . CAD (coronary artery disease) 11/23/2017 . Elevated troponin   . SVT (supraventricular tachycardia) ([place]) 10/05/2017 . History of colonic polyps   . Encounter for screening colonoscopy 03/13/2014 . Constipation 03/13/2014 . GERD (gastroesophageal reflux disease) 03/13/2014 . Hyperlipidemia   . Type 2 diabetes mellitus ([place])   . OA (osteoarthritis) of knee   . Hypothyroidism   . Chest pain   . Obesity   . Hypertension    Thank you,  Genene Churn, [place]  [facility] 01/15/2020, 5:46 PM  [address] [place], [place], [address] Phone: [phone]   Fax:  [phone]  Name: Kim Mills MRN: [id number] Date of Birth: Sep 01, 1940 CLINICAL DATA:  Dysphagia. Difficulty swallowing solids and liquids. History stroke EXAM: MODIFIED BARIUM SWALLOW TECHNIQUE: Different consistencies of barium were administered orally to the patient by the Speech Pathologist. Imaging of the pharynx was performed in the lateral projection. The radiologist  was present in the fluoroscopy room for this study, providing personal supervision. FLUOROSCOPY TIME:  Fluoroscopy Time:  2 minutes 42 seconds Radiation Exposure Index (if provided by the fluoroscopic device): 18.6 mGy Number of Acquired Spot Images: multiple fluoroscopic screen captures COMPARISON:  None FINDINGS: With thin barium, minimal laryngeal penetration is seen. No aspiration. Minimal vallecular and piriform sinus residuals, cleared by a second swallow. No abnormalities identified with applesauce or cracker consistency. Patient was unable to swallow the 12.5 mm diameter barium tablet with thin liquid or with applesauce. IMPRESSION: Minimal swallowing dysfunction as above. Please refer to the Speech Pathologists report for complete details and recommendations. Electronically Signed   By: Lavonia Dana M.D.   On: 01/15/2020 14:36      ASSESSMENT:   1. Normocytic anemia:  -Patient seen at the request of Dr. Buelah Manis for further work-up and management of normocytic anemia. -Initial CBC showed hemoglobin 6.9 with MCV of 88.3. White count and platelets are normal. No history of CKD. Denies any bleeding per rectum or melena. Last colonoscopy on 04/01/2014 shows diffusely pigmented rectal mucosa consistent with melanosis [name] Diffusely pigmented colonic mucosa, 2 diminutive polyps in the mid ascending segment, otherwise the remainder of the colon mucosa was normal. Biopsy consistent with tubular adenoma. -CT renal study on 05/11/2019 shows normal-sized spleen with normal liver. No other abnormal adenopathy. -Denies any prior history of blood transfusion. She is currently taking iron tablet twice daily.  2. IgG lambda plasma cell myeloma: -Skeletal survey on 09/12/2019 shows diffuse faint lucencies in the skull, bilateral femurs. -Bone marrow biopsy on 09/27/2019 shows 70% plasma cells in hypercellular marrow. Chromosome analysis was 33, XX. FISH panel was normal.  However quality of specimen is compromised. -LDH normal, beta-2 microglobulin 2.9. M spike was 2.1 g. Kappa light chains 19.7, lambda light chains 14.3 with ratio of 1.38. -24-hour urine shows nonnephrotic range proteinuria. Urine immunofixation was positive.   PLAN:  1.IgG lambda plasma cell myeloma:  -She is tolerating Revlimid very well.  She is taking 15 mg 2 weeks on 1 week off. -She started her week off this morning. -She will receive Velcade injection today. Mild intermittent neuropathy in hands and feet. -Recommend follow-up in 4 weeks prior to next cycle of RVD.  2. Diabetes: -Continue glipizide and Metformin.  3. Hypertension: -Continue amlodipine and HCTZ.  4. Hypothyroidism: -Continue Synthroid daily.  5. B12 deficiency: -Continue B12 monthly injections.  6. Right posterior hip pain: -Continue  hydrocodone 5/325 every 6 hours as needed.  7.  Infection prophylaxis: -Continue acyclovir 400 mg twice daily.  Continue aspirin 81 mg daily.  8.  Normocytic anemia: -Hemoglobin was 7.2 with MCV of 91 on 11/12/2019.  Likely from infiltration by myeloma cells. -Ferritin is 256, percent saturation is 23, folic acid and R74 was normal. -Hemoglobin continues to improve and is 9.4.  9.  Dysphagia: -Unclear etiology. -Has outpatient swallow evaluation scheduled for 01/15/2020.  10. Elevated creatinine: -Improved. -Continue hydration.  12.  Upper respiratory infection: -Treated by Dr. Buelah Manis on 12/18/2019 for sinusitis and bronchitis. -She has completed Zithromax. -Feels she is improving -Might account for slight bump in white count today.  Disposition: -Return to clinic weekly for Velcade injection. -On week off.  Will restart Revlimid in on Sunday. -Velcade injection today. -Return to clinic in 4 weeks for lab, Velcade and assessment   Greater than 50% was spent in counseling and coordination of care with this patient including but not limited to discussion of the  relevant topics above (See A&P) including, but not limited to diagnosis and management of acute and chronic medical conditions.    Orders placed this encounter:  No orders of the defined types were placed in this encounter.  Benay Pike MD

## 2020-01-21 ENCOUNTER — Ambulatory Visit (HOSPITAL_COMMUNITY): Payer: Medicare Other | Admitting: Oncology

## 2020-01-21 ENCOUNTER — Ambulatory Visit (HOSPITAL_COMMUNITY): Payer: Medicare Other

## 2020-01-21 ENCOUNTER — Inpatient Hospital Stay (HOSPITAL_COMMUNITY): Payer: Medicare Other

## 2020-01-21 ENCOUNTER — Ambulatory Visit (HOSPITAL_COMMUNITY): Payer: Medicare Other | Admitting: Hematology and Oncology

## 2020-01-21 ENCOUNTER — Other Ambulatory Visit (HOSPITAL_COMMUNITY): Payer: Self-pay | Admitting: Hematology and Oncology

## 2020-01-21 ENCOUNTER — Other Ambulatory Visit: Payer: Self-pay

## 2020-01-21 ENCOUNTER — Other Ambulatory Visit (HOSPITAL_COMMUNITY): Payer: Medicare Other

## 2020-01-21 ENCOUNTER — Encounter (HOSPITAL_COMMUNITY): Payer: Self-pay

## 2020-01-21 VITALS — BP 128/51 | HR 64 | Temp 96.9°F | Resp 17 | Wt 178.6 lb

## 2020-01-21 DIAGNOSIS — C9000 Multiple myeloma not having achieved remission: Secondary | ICD-10-CM | POA: Diagnosis not present

## 2020-01-21 DIAGNOSIS — Z5112 Encounter for antineoplastic immunotherapy: Secondary | ICD-10-CM | POA: Diagnosis not present

## 2020-01-21 LAB — CBC WITH DIFFERENTIAL/PLATELET
Abs Immature Granulocytes: 0.08 10*3/uL — ABNORMAL HIGH (ref 0.00–0.07)
Basophils Absolute: 0.1 10*3/uL (ref 0.0–0.1)
Basophils Relative: 1 %
Eosinophils Absolute: 0.7 10*3/uL — ABNORMAL HIGH (ref 0.0–0.5)
Eosinophils Relative: 7 %
HCT: 32.1 % — ABNORMAL LOW (ref 36.0–46.0)
Hemoglobin: 9.9 g/dL — ABNORMAL LOW (ref 12.0–15.0)
Immature Granulocytes: 1 %
Lymphocytes Relative: 30 %
Lymphs Abs: 3 10*3/uL (ref 0.7–4.0)
MCH: 29.2 pg (ref 26.0–34.0)
MCHC: 30.8 g/dL (ref 30.0–36.0)
MCV: 94.7 fL (ref 80.0–100.0)
Monocytes Absolute: 1.1 10*3/uL — ABNORMAL HIGH (ref 0.1–1.0)
Monocytes Relative: 11 %
Neutro Abs: 5 10*3/uL (ref 1.7–7.7)
Neutrophils Relative %: 50 %
Platelets: 164 10*3/uL (ref 150–400)
RBC: 3.39 MIL/uL — ABNORMAL LOW (ref 3.87–5.11)
RDW: 19.5 % — ABNORMAL HIGH (ref 11.5–15.5)
WBC: 10 10*3/uL (ref 4.0–10.5)
nRBC: 0.2 % (ref 0.0–0.2)

## 2020-01-21 LAB — COMPREHENSIVE METABOLIC PANEL
ALT: 10 U/L (ref 0–44)
AST: 11 U/L — ABNORMAL LOW (ref 15–41)
Albumin: 3.6 g/dL (ref 3.5–5.0)
Alkaline Phosphatase: 70 U/L (ref 38–126)
Anion gap: 10 (ref 5–15)
BUN: 17 mg/dL (ref 8–23)
CO2: 26 mmol/L (ref 22–32)
Calcium: 8.8 mg/dL — ABNORMAL LOW (ref 8.9–10.3)
Chloride: 102 mmol/L (ref 98–111)
Creatinine, Ser: 0.92 mg/dL (ref 0.44–1.00)
GFR, Estimated: >60 mL/min (ref >60)
Glucose, Bld: 252 mg/dL — ABNORMAL HIGH (ref 70–99)
Potassium: 3.6 mmol/L (ref 3.5–5.1)
Sodium: 138 mmol/L (ref 135–145)
Total Bilirubin: 0.8 mg/dL (ref 0.3–1.2)
Total Protein: 6.4 g/dL — ABNORMAL LOW (ref 6.5–8.1)

## 2020-01-21 MED ORDER — DEXAMETHASONE 4 MG PO TABS
20.0000 mg | ORAL_TABLET | Freq: Once | ORAL | Status: AC
  Administered 2020-01-21: 20 mg via ORAL
  Filled 2020-01-21: qty 5

## 2020-01-21 MED ORDER — BORTEZOMIB CHEMO SQ INJECTION 3.5 MG (2.5MG/ML)
1.3000 mg/m2 | Freq: Once | INTRAMUSCULAR | Status: AC
  Administered 2020-01-21: 2.25 mg via SUBCUTANEOUS
  Filled 2020-01-21: qty 0.9

## 2020-01-21 NOTE — Progress Notes (Signed)
Patient tolerated Velcade injection with no complaints voiced.  Lab work reviewed.  See MAR for details.  Injection site clean and dry with no bruising or swelling noted.  Band aid applied.  VSS.  Patient left in satisfactory condition with no s/s of distress noted.  

## 2020-01-25 NOTE — Progress Notes (Signed)
[place] [address] [place] [address]  Kim Mills [id number] 06-04-1940 79 y.o.  [name] is managed by Dr. Sanda Linger  Actively treated with chemotherapy/immunotherapy/hormonal therapy: yes  Current therapy:  Bortezomib weekly; Revlimid 2 weeks on, 1 week off  Last treated: 01/21/2020 (cycle 5, day 1)  Next scheduled appointment with provider: 02/04/2020  Assessment: Plan:    Multiple myeloma not having achieved remission [facility])   Multiple myeloma: Ms. Scovill presents to the clinic today for ongoing treatment with weekly bortezomib. She additionally continues on Revlimid. We will proceed with her treatment today and have her return for weekly therapy. She will return for labs and for consideration of cycle 5, day 15 of bortezomib  in 1 week.  Please see After Visit Summary for patient specific instructions.  Future Appointments  Date Time Provider [place]  01/28/2020  8:50 AM AP-ACAPA LAB AP-ACAPA None  01/28/2020 10:00 AM AP-ACAPA COVERING PROVIDER AP-ACAPA None  01/28/2020 10:30 AM AP-ACAPA INJ NURSE AP-ACAPA None  02/04/2020  9:30 AM AP-ACAPA LAB AP-ACAPA None  02/04/2020 10:30 AM AP-ACAPA INJ NURSE AP-ACAPA None  03/24/2020  2:45 PM McKenzie, Candee Furbish, MD AUR-AUR None    No orders of the defined types were placed in this encounter.      Subjective:   Patient ID:  Kim Mills is a 79 y.o. (DOB 04/04/1940) female.  Chief Complaint: No chief complaint on file.   HPI Kim Mills is a 79 y.o. female with a diagnosis of multiple myeloma. She is followed by Dr. Sanda Linger and presents for ongoing treatment with weekly bortezomib. She additionally continues on Revlimid.  She presents to the clinic today for consideration of cycle 5, day 8 of bortezomib.  She continues on oral Revlimid.  She reports overall that she is doing well.  She has no pain.  She reports that her energy level is  approximately 75% of what it has been in the past.  Her appetite is good.  She reports cough, episodic constipation, anxiety, and ongoing neuropathy.  She denies any other issues of concern today.  She reports that she is currently staying with her daughter in the area.  The patient's oncologic history is as follows:  BRIEF ONCOLOGIC HISTORY:     Oncology History  Multiple myeloma not having achieved remission ([place])  10/13/2019 Initial Diagnosis   Multiple myeloma not having achieved remission ([place])   10/29/2019 -  Chemotherapy   The patient had dexamethasone (DECADRON) tablet 20 mg, 20 mg (100 % of original dose 20 mg), Oral,  Once, 3 of 8 cycles Dose modification: 20 mg (original dose 20 mg, Cycle 1) Administration: 20 mg (10/29/2019), 20 mg (11/05/2019), 20 mg (11/12/2019), 20 mg (11/19/2019), 20 mg (11/26/2019), 20 mg (12/03/2019), 20 mg (12/10/2019), 20 mg (12/17/2019) bortezomib SQ (VELCADE) chemo injection 2.25 mg, 1.3 mg/m2 = 2.25 mg, Subcutaneous,  Once, 3 of 8 cycles Administration: 2.25 mg (10/29/2019), 2.25 mg (11/05/2019), 2.25 mg (11/12/2019), 2.25 mg (11/19/2019), 2.25 mg (11/26/2019), 2.25 mg (12/03/2019), 2.25 mg (12/10/2019)  for chemotherapy treatment.      Medications: I have reviewed the patient's current medications.  Allergies: No Known Allergies  Past Medical History:  Diagnosis Date  . Arthritis   . Cataract   . Chest pain    Associated with weakness and fatigue  . Diabetes mellitus    A1c of 7.4 in 08/2010  . GERD (gastroesophageal reflux disease)   . Glaucoma   .  Hyperlipidemia    Lipid profile in 08/2010:190, 121, 48, 118; normal CBC and CMet  . Hypertension    Lipid profile in 08/2010:190, 121, 48, 118; normal CBC and CMet  . Hypothyroidism   . Hypothyroidism   . Obesity     Past Surgical History:  Procedure Laterality Date  . CHOLECYSTECTOMY    . COLONOSCOPY N/A 04/01/2014   RMR: Melanosis coli. colonic polyps removed as described above.   .  CYSTOSCOPY W/ URETERAL STENT PLACEMENT Right 09/13/2019   Procedure: CYSTOSCOPY WITH RIGHT  RETROGRADE PYELOGRAM; BLADDER BIOPSY;  Surgeon: Cleon Gustin, MD;  Location: [facility];  Service: Urology;  Laterality: Right;  . LEFT HEART CATH AND CORONARY ANGIOGRAPHY N/A 10/07/2017   Procedure: LEFT HEART CATH AND CORONARY ANGIOGRAPHY;  Surgeon: Troy Sine, MD;  Location: [place] [facility];  Service: Cardiovascular;  Laterality: N/A;  . THYROIDECTOMY, PARTIAL      Family History  Problem Relation Age of Onset  . Anemia Father   . Arthritis Mother   . Stroke Brother   . Cancer Sister        unknown kind  . Diabetes Sister   . Dementia Sister   . Cancer Niece        Breast  . Cancer Brother   . Healthy Son   . Healthy Son   . Healthy Son   . Healthy Daughter   . Healthy Daughter   . Healthy Daughter   . Sarcoidosis Daughter     Social History   Socioeconomic History  . Marital status: Widowed    Spouse name: Not on file  . Number of children: 7  . Years of education: Not on file  . Highest education level: Not on file  Occupational History    Employer: RETIRED  Tobacco Use  . Smoking status: Passive Smoke Exposure - Never Smoker  . Smokeless tobacco: Never Used  Vaping Use  . Vaping Use: Never used  Substance and Sexual Activity  . Alcohol use: Never  . Drug use: Never  . Sexual activity: Not Currently  Other Topics Concern  . Not on file  Social History Narrative  . Not on file   Social Determinants of Health   Financial Resource Strain: Not on file  Food Insecurity: Not on file  Transportation Needs: No Transportation Needs  . Lack of Transportation (Medical): No  . Lack of Transportation (Non-Medical): No  Physical Activity: Inactive  . Days of Exercise per Week: 0 days  . Minutes of Exercise per Session: 0 min  Stress: Not on file  Social Connections: Not on file  Intimate Partner Violence: Not At Risk  . Fear of Current or Ex-Partner: No  .  Emotionally Abused: No  . Physically Abused: No  . Sexually Abused: No    Past Medical History, Surgical history, Social history, and Family history were reviewed and updated as appropriate.   Please see review of systems for further details on the patient's review from today.   Review of Systems:  Review of Systems  Constitutional: Positive for fatigue. Negative for chills, diaphoresis and fever.  HENT: Negative for trouble swallowing and voice change.   Respiratory: Positive for cough. Negative for chest tightness, shortness of breath and wheezing.   Cardiovascular: Negative for chest pain and palpitations.  Gastrointestinal: Positive for constipation. Negative for abdominal pain, diarrhea, nausea and vomiting.  Musculoskeletal: Negative for back pain and myalgias.  Neurological: Positive for numbness. Negative for dizziness, light-headedness and headaches.  Psychiatric/Behavioral: The patient is nervous/anxious.     Objective:   Physical Exam:  There were no vitals taken for this visit. ECOG: 0  Physical Exam Constitutional:      General: She is not in acute distress.    Appearance: Normal appearance. She is not ill-appearing, toxic-appearing or diaphoretic.     Comments: Patient's exam was completed from a wheelchair.  HENT:     Head: Normocephalic and atraumatic.  Eyes:     General: No scleral icterus.       Right eye: No discharge.        Left eye: No discharge.     Conjunctiva/sclera: Conjunctivae normal.  Cardiovascular:     Rate and Rhythm: Normal rate and regular rhythm.     Heart sounds: No murmur heard. No friction rub. No gallop.   Pulmonary:     Effort: Pulmonary effort is normal. No respiratory distress.     Breath sounds: Normal breath sounds. No wheezing, rhonchi or rales.  Abdominal:     General: Bowel sounds are normal.     Tenderness: There is no abdominal tenderness. There is no guarding.  Skin:    Findings: No erythema or rash.  Neurological:      Mental Status: She is alert.     Gait: Gait abnormal (The patient is ambulating with the use of a wheelchair.).  Psychiatric:        Mood and Affect: Mood normal.        Behavior: Behavior normal.        Thought Content: Thought content normal.        Judgment: Judgment normal.     Lab Review:     Component Value Date/Time   NA 138 01/21/2020 0934   K 3.6 01/21/2020 0934   CL 102 01/21/2020 0934   CO2 26 01/21/2020 0934   GLUCOSE 252 (H) 01/21/2020 0934   BUN 17 01/21/2020 0934   CREATININE 0.92 01/21/2020 0934   CREATININE 0.88 08/17/2019 1547   CALCIUM 8.8 (L) 01/21/2020 0934   PROT 6.4 (L) 01/21/2020 0934   ALBUMIN 3.6 01/21/2020 0934   AST 11 (L) 01/21/2020 0934   ALT 10 01/21/2020 0934   ALKPHOS 70 01/21/2020 0934   BILITOT 0.8 01/21/2020 0934   GFRNONAA >60 01/21/2020 0934   GFRNONAA 41 (L) 04/13/2019 1241   GFRAA >60 11/12/2019 0950   GFRAA 48 (L) 04/13/2019 1241       Component Value Date/Time   WBC 10.0 01/21/2020 0934   RBC 3.39 (L) 01/21/2020 0934   HGB 9.9 (L) 01/21/2020 0934   HCT 32.1 (L) 01/21/2020 0934   PLT 164 01/21/2020 0934   MCV 94.7 01/21/2020 0934   MCH 29.2 01/21/2020 0934   MCHC 30.8 01/21/2020 0934   RDW 19.5 (H) 01/21/2020 0934   LYMPHSABS 3.0 01/21/2020 0934   MONOABS 1.1 (H) 01/21/2020 0934   EOSABS 0.7 (H) 01/21/2020 0934   BASOSABS 0.1 01/21/2020 0934   -------------------------------  Imaging from last 24 hours (if applicable):  Radiology interpretation: DG OP Swallowing Func-Medicare/Speech Path  Result Date: 01/16/2020 [facility] [address] [place], [place], [address] Phone: [phone]   Fax:  [phone]  Modified Barium Swallow  Patient Details Name: Kim Mills MRN: [id number] Date of Birth: 1940-09-05 No data recorded  Encounter Date: 01/15/2020        End of Session - 01/15/20 1742        Visit Number 1    Number  of Visits 1    Authorization Type Medicare    SLP Start Time 1330     SLP Stop Time  1358    SLP Time Calculation (min) 28 min    Activity Tolerance Patient tolerated treatment well            Past Medical History: Diagnosis Date . Arthritis   . Cataract   . Chest pain     Associated with weakness and fatigue . Diabetes mellitus     A1c of 7.4 in 08/2010 . GERD (gastroesophageal reflux disease)   . Glaucoma   . Hyperlipidemia     Lipid profile in 08/2010:190, 121, 48, 118; normal CBC and CMet . Hypertension     Lipid profile in 08/2010:190, 121, 48, 118; normal CBC and CMet . Hypothyroidism   . Hypothyroidism   . Obesity         Past Surgical History: Procedure Laterality Date . CHOLECYSTECTOMY     . COLONOSCOPY N/A 04/01/2014   RMR: Melanosis coli. colonic polyps removed as described above.  . CYSTOSCOPY W/ URETERAL STENT PLACEMENT Right 09/13/2019   Procedure: CYSTOSCOPY WITH RIGHT  RETROGRADE PYELOGRAM; BLADDER BIOPSY;  Surgeon: Cleon Gustin, MD;  Location[facility];  Service: Urology;  Laterality: Right; . LEFT HEART CATH AND CORONARY ANGIOGRAPHY N/A 10/07/2017   Procedure: LEFT HEART CATH AND CORONARY ANGIOGRAPHY;  Surgeon: Troy Sine, MD;  Location: [place] [facility];  Service: Cardiovascular;  Laterality: N/A; . THYROIDECTOMY, PARTIAL       There were no vitals filed for this visit.        Subjective Assessment - 01/15/20 1735        Subjective "Sometimes I can get choked on my saliva."    Special Tests MBSS    Currently in Pain? No/denies                General - 01/15/20 1736          General Information   Date of Onset 01/09/20    HPI Demetra Moya is a 79 yo female who was referred for MBSS by Faythe Casa, NP (Oncology) due to Pt reports of difficulty swallowing for the past year.    Type of Study MBS-Modified Barium Swallow Study    Diet Prior to this Study Regular;Thin liquids    Temperature Spikes Noted No    Respiratory Status Room air    History of Recent Intubation No    Behavior/Cognition Alert;Cooperative;Pleasant mood    Oral Cavity Assessment Within  Functional Limits    Oral Care Completed by SLP No    Oral Cavity - Dentition Adequate natural dentition    Vision Functional for self feeding    Self-Feeding Abilities Able to feed self    Patient Positioning Upright in chair    Baseline Vocal Quality Normal    Volitional Cough Strong    Volitional Swallow Able to elicit    Anatomy Within functional limits    Pharyngeal Secretions Not observed secondary MBS                Oral Preparation/Oral Phase - 01/15/20 1738          Oral Preparation/Oral Phase   Oral Phase Impaired      Oral - Solids   Oral - Pill Absent A-P transit untitled image  unable to propel pill to posterior oral cavity with water or puree     Electrical stimulation - Oral Phase   Was Electrical  Stimulation Used No               Pharyngeal Phase - 01/15/20 1739          Pharyngeal Phase   Pharyngeal Phase Impaired      Pharyngeal - Thin   Pharyngeal- Thin Teaspoon Within functional limits;Pharyngeal residue - valleculae;Pharyngeal residue - pyriform untitled image  trace residuals, clear with dry swallow   Pharyngeal- Thin Cup Swallow initiation at pyriform sinus;Delayed swallow initiation;Penetration/Aspiration before swallow;Pharyngeal residue - valleculae;Pharyngeal residue - pyriform    Pharyngeal Material does not enter airway;Material enters airway, remains ABOVE vocal cords then ejected out    Pharyngeal- Thin Straw Swallow initiation at pyriform sinus      Pharyngeal - Solids   Pharyngeal- Puree Swallow initiation at vallecula    Pharyngeal- Regular Delayed swallow initiation-vallecula    Pharyngeal- Pill Not tested      Electrical Stimulation - Pharyngeal Phase   Was Electrical Stimulation Used No               Cricopharyngeal Phase - 01/15/20 1741          Cervical Esophageal Phase   Cervical Esophageal Phase Within functional limits                Plan - 01/15/20 1742        Clinical Impression Statement Pt presents with mild oral phase and normal for age pharyngeal phase characterized by  difficulty with oral anterior posterior transit of barium tablet, which was presented in both puree and with thin liquids. Pt had to expectorate the pill. Pharyngeal phase is marked by min delay in swallow initiation with swallow trigger at the pyriforms with liquids resulting in flash penetration without aspiration and trace to min residuals in the valleculae and pyriforms. These residuals were cleared with a dry swallow (sometimes she required a verbal cue to clear). Pt does report an element of xerostomia, which can negatively impact oral phase of swallow (difficult to trigger swallow). Pt indicates that she has never had an easy time swallowing pills, but feels that it is more difficult now. Recommend regular textures and thin liquids, swallow 2x for each sip to clear pharynx, and try po medications whole in puree and/or crush as able. No further SLP services indicated at this time. Pt and daughter are in agreement with plan of care.    Consulted and Agree with Plan of Care Patient         Patient will benefit from skilled therapeutic intervention in order to improve the following deficits and impairments:  Dysphagia, oropharyngeal phase         Recommendations/Treatment - 01/15/20 1741          Swallow Evaluation Recommendations   SLP Diet Recommendations Thin;Age appropriate regular    Liquid Administration via Cup;Straw    Medication Administration Whole meds with puree    Supervision Patient able to self feed    Compensations Multiple dry swallows after each bite/sip    Postural Changes Seated upright at 90 degrees;Remain upright for at least 30 minutes after feeds/meals               Prognosis - 01/15/20 1741          Prognosis   Prognosis for Safe Diet Advancement Good      Individuals Consulted   Consulted and Agree with Results and Recommendations Patient;Family member/caregiver    Family Member Consulted daughter    Report Sent to  Referring physician  Problem List    Patient Active Problem List    Diagnosis Date Noted . Multiple myeloma ([place]) 10/13/2019 . Goals of care, counseling/discussion 10/13/2019 . Multiple myeloma not having achieved remission ([place]) 10/13/2019 . Monoclonal gammopathy 09/12/2019 . Gross hematuria 07/02/2019 . CKD (chronic kidney disease) stage 3, GFR 30-59 ml/min (HCC) 04/18/2019 . Vitamin D deficiency 04/17/2019 . B12 deficiency 04/12/2018 . Normocytic anemia 04/12/2018 . Carotid artery disease without cerebral infarction ([place]) 04/12/2018 . Gait disorder 01/11/2018 . Diabetic neuropathy ([place]) 01/10/2018 . Bilateral carotid bruits 11/28/2017 . CAD (coronary artery disease) 11/23/2017 . Elevated troponin   . SVT (supraventricular tachycardia) ([place]) 10/05/2017 . History of colonic polyps   . Encounter for screening colonoscopy 03/13/2014 . Constipation 03/13/2014 . GERD (gastroesophageal reflux disease) 03/13/2014 . Hyperlipidemia   . Type 2 diabetes mellitus ([place])   . OA (osteoarthritis) of knee   . Hypothyroidism   . Chest pain   . Obesity   . Hypertension    Thank you,  Genene Churn, [place]  [facility] 01/15/2020, 5:46 PM  [place], [place], [address] Phone: [phone]   Fax:  [phone]  Name: Kim Mills MRN: [id number] Date of Birth: [birth year] CLINICAL DATA:  Dysphagia. Difficulty swallowing solids and liquids. History stroke EXAM: MODIFIED BARIUM SWALLOW TECHNIQUE: Different consistencies of barium were administered orally to the patient by the Speech Pathologist. Imaging of the pharynx was performed in the lateral projection. The radiologist was present in the fluoroscopy room for this study, providing personal supervision. FLUOROSCOPY TIME:  Fluoroscopy Time:  2 minutes 42 seconds Radiation Exposure Index (if provided by the fluoroscopic device): 18.6 mGy Number of Acquired Spot Images: multiple fluoroscopic screen captures COMPARISON:  None FINDINGS: With thin barium, minimal laryngeal  penetration is seen. No aspiration. Minimal vallecular and piriform sinus residuals, cleared by a second swallow. No abnormalities identified with applesauce or cracker consistency. Patient was unable to swallow the 12.5 mm diameter barium tablet with thin liquid or with applesauce. IMPRESSION: Minimal swallowing dysfunction as above. Please refer to the Speech Pathologists report for complete details and recommendations. Electronically Signed   By: Lavonia Dana M.D.   On: 01/15/2020 14:36

## 2020-01-28 ENCOUNTER — Encounter (HOSPITAL_COMMUNITY): Payer: Self-pay

## 2020-01-28 ENCOUNTER — Other Ambulatory Visit: Payer: Self-pay | Admitting: Medical

## 2020-01-28 ENCOUNTER — Inpatient Hospital Stay (HOSPITAL_BASED_OUTPATIENT_CLINIC_OR_DEPARTMENT_OTHER): Payer: Medicare Other | Admitting: Medical

## 2020-01-28 ENCOUNTER — Other Ambulatory Visit: Payer: Self-pay

## 2020-01-28 ENCOUNTER — Inpatient Hospital Stay (HOSPITAL_COMMUNITY): Payer: Medicare Other

## 2020-01-28 VITALS — BP 151/47 | HR 73 | Temp 97.5°F | Resp 18 | Wt 182.6 lb

## 2020-01-28 DIAGNOSIS — C9000 Multiple myeloma not having achieved remission: Secondary | ICD-10-CM | POA: Diagnosis not present

## 2020-01-28 DIAGNOSIS — Z5112 Encounter for antineoplastic immunotherapy: Secondary | ICD-10-CM | POA: Diagnosis not present

## 2020-01-28 LAB — COMPREHENSIVE METABOLIC PANEL
ALT: 11 U/L (ref 0–44)
AST: 13 U/L — ABNORMAL LOW (ref 15–41)
Albumin: 3.8 g/dL (ref 3.5–5.0)
Alkaline Phosphatase: 70 U/L (ref 38–126)
Anion gap: 10 (ref 5–15)
BUN: 21 mg/dL (ref 8–23)
CO2: 26 mmol/L (ref 22–32)
Calcium: 8.9 mg/dL (ref 8.9–10.3)
Chloride: 102 mmol/L (ref 98–111)
Creatinine, Ser: 1.04 mg/dL — ABNORMAL HIGH (ref 0.44–1.00)
GFR, Estimated: 55 mL/min — ABNORMAL LOW (ref >60)
Glucose, Bld: 205 mg/dL — ABNORMAL HIGH (ref 70–99)
Potassium: 3.7 mmol/L (ref 3.5–5.1)
Sodium: 138 mmol/L (ref 135–145)
Total Bilirubin: 0.7 mg/dL (ref 0.3–1.2)
Total Protein: 6.9 g/dL (ref 6.5–8.1)

## 2020-01-28 LAB — CBC WITH DIFFERENTIAL/PLATELET
Abs Immature Granulocytes: 0.16 10*3/uL — ABNORMAL HIGH (ref 0.00–0.07)
Basophils Absolute: 0 10*3/uL (ref 0.0–0.1)
Basophils Relative: 1 %
Eosinophils Absolute: 0.7 10*3/uL — ABNORMAL HIGH (ref 0.0–0.5)
Eosinophils Relative: 9 %
HCT: 32.9 % — ABNORMAL LOW (ref 36.0–46.0)
Hemoglobin: 10.1 g/dL — ABNORMAL LOW (ref 12.0–15.0)
Immature Granulocytes: 2 %
Lymphocytes Relative: 38 %
Lymphs Abs: 3.2 10*3/uL (ref 0.7–4.0)
MCH: 29.4 pg (ref 26.0–34.0)
MCHC: 30.7 g/dL (ref 30.0–36.0)
MCV: 95.6 fL (ref 80.0–100.0)
Monocytes Absolute: 0.4 10*3/uL (ref 0.1–1.0)
Monocytes Relative: 5 %
Neutro Abs: 3.9 10*3/uL (ref 1.7–7.7)
Neutrophils Relative %: 45 %
Platelets: 241 10*3/uL (ref 150–400)
RBC: 3.44 MIL/uL — ABNORMAL LOW (ref 3.87–5.11)
RDW: 19.9 % — ABNORMAL HIGH (ref 11.5–15.5)
WBC: 8.4 10*3/uL (ref 4.0–10.5)
nRBC: 0.5 % — ABNORMAL HIGH (ref 0.0–0.2)

## 2020-01-28 MED ORDER — DEXAMETHASONE 4 MG PO TABS
20.0000 mg | ORAL_TABLET | Freq: Once | ORAL | Status: AC
  Administered 2020-01-28: 20 mg via ORAL
  Filled 2020-01-28: qty 5

## 2020-01-28 MED ORDER — BORTEZOMIB CHEMO SQ INJECTION 3.5 MG (2.5MG/ML)
1.3000 mg/m2 | Freq: Once | INTRAMUSCULAR | Status: AC
  Administered 2020-01-28: 2.25 mg via SUBCUTANEOUS
  Filled 2020-01-28: qty 0.9

## 2020-01-28 NOTE — Progress Notes (Signed)
Ok to treat today verbal order Sandi Mealy, PA.    Patient tolerated Velcade injection with no complaints voiced.  Lab work reviewed.  See MAR for details.  Injection site clean and dry with no bruising or swelling noted.  Band aid applied.  VSS.  Patient left in satisfactory condition with no s/s of distress noted.

## 2020-01-28 NOTE — Progress Notes (Signed)
OK to treat.  Donielle Kaigler, MHS, PA-C Physician Assistant 

## 2020-01-29 ENCOUNTER — Other Ambulatory Visit (HOSPITAL_COMMUNITY): Payer: Self-pay

## 2020-01-29 ENCOUNTER — Telehealth: Payer: Self-pay | Admitting: Family Medicine

## 2020-01-29 MED ORDER — LENALIDOMIDE 15 MG PO CAPS
15.0000 mg | ORAL_CAPSULE | Freq: Every day | ORAL | 0 refills | Status: DC
Start: 2020-01-29 — End: 2020-02-18

## 2020-01-29 NOTE — Telephone Encounter (Signed)
Pt has had covid vaccine at [facility] in [place].

## 2020-01-29 NOTE — Telephone Encounter (Signed)
Chart reviewed. Revlimid refilled per Lucianne Lei Tanner's last office note.

## 2020-02-04 ENCOUNTER — Encounter (HOSPITAL_COMMUNITY): Payer: Self-pay

## 2020-02-04 ENCOUNTER — Inpatient Hospital Stay (HOSPITAL_COMMUNITY): Payer: Medicare Other

## 2020-02-04 ENCOUNTER — Ambulatory Visit (HOSPITAL_COMMUNITY): Payer: Medicare Other

## 2020-02-04 ENCOUNTER — Other Ambulatory Visit: Payer: Self-pay

## 2020-02-04 ENCOUNTER — Other Ambulatory Visit (HOSPITAL_COMMUNITY): Payer: Medicare Other

## 2020-02-04 VITALS — BP 134/54 | HR 67 | Temp 96.9°F | Resp 18

## 2020-02-04 DIAGNOSIS — C9000 Multiple myeloma not having achieved remission: Secondary | ICD-10-CM

## 2020-02-04 DIAGNOSIS — Z5112 Encounter for antineoplastic immunotherapy: Secondary | ICD-10-CM | POA: Diagnosis not present

## 2020-02-04 LAB — COMPREHENSIVE METABOLIC PANEL
ALT: 9 U/L (ref 0–44)
AST: 13 U/L — ABNORMAL LOW (ref 15–41)
Albumin: 3.6 g/dL (ref 3.5–5.0)
Alkaline Phosphatase: 65 U/L (ref 38–126)
Anion gap: 10 (ref 5–15)
BUN: 20 mg/dL (ref 8–23)
CO2: 26 mmol/L (ref 22–32)
Calcium: 8.8 mg/dL — ABNORMAL LOW (ref 8.9–10.3)
Chloride: 104 mmol/L (ref 98–111)
Creatinine, Ser: 1.02 mg/dL — ABNORMAL HIGH (ref 0.44–1.00)
GFR, Estimated: 57 mL/min — ABNORMAL LOW (ref >60)
Glucose, Bld: 194 mg/dL — ABNORMAL HIGH (ref 70–99)
Potassium: 3.6 mmol/L (ref 3.5–5.1)
Sodium: 140 mmol/L (ref 135–145)
Total Bilirubin: 1 mg/dL (ref 0.3–1.2)
Total Protein: 6.4 g/dL — ABNORMAL LOW (ref 6.5–8.1)

## 2020-02-04 LAB — CBC WITH DIFFERENTIAL/PLATELET
Abs Immature Granulocytes: 0.08 10*3/uL — ABNORMAL HIGH (ref 0.00–0.07)
Basophils Absolute: 0.1 10*3/uL (ref 0.0–0.1)
Basophils Relative: 1 %
Eosinophils Absolute: 0.6 10*3/uL — ABNORMAL HIGH (ref 0.0–0.5)
Eosinophils Relative: 7 %
HCT: 32 % — ABNORMAL LOW (ref 36.0–46.0)
Hemoglobin: 10 g/dL — ABNORMAL LOW (ref 12.0–15.0)
Immature Granulocytes: 1 %
Lymphocytes Relative: 37 %
Lymphs Abs: 3.1 10*3/uL (ref 0.7–4.0)
MCH: 29.2 pg (ref 26.0–34.0)
MCHC: 31.3 g/dL (ref 30.0–36.0)
MCV: 93.6 fL (ref 80.0–100.0)
Monocytes Absolute: 0.9 10*3/uL (ref 0.1–1.0)
Monocytes Relative: 11 %
Neutro Abs: 3.6 10*3/uL (ref 1.7–7.7)
Neutrophils Relative %: 43 %
Platelets: 242 10*3/uL (ref 150–400)
RBC: 3.42 MIL/uL — ABNORMAL LOW (ref 3.87–5.11)
RDW: 19.2 % — ABNORMAL HIGH (ref 11.5–15.5)
WBC: 8.3 10*3/uL (ref 4.0–10.5)
nRBC: 0 % (ref 0.0–0.2)

## 2020-02-04 MED ORDER — BORTEZOMIB CHEMO SQ INJECTION 3.5 MG (2.5MG/ML)
1.3000 mg/m2 | Freq: Once | INTRAMUSCULAR | Status: AC
  Administered 2020-02-04: 2.25 mg via SUBCUTANEOUS
  Filled 2020-02-04: qty 0.9

## 2020-02-04 MED ORDER — DEXAMETHASONE 4 MG PO TABS
20.0000 mg | ORAL_TABLET | Freq: Once | ORAL | Status: AC
  Administered 2020-02-04: 20 mg via ORAL
  Filled 2020-02-04: qty 5

## 2020-02-04 NOTE — Patient Instructions (Signed)
[facility] Discharge Instructions for Patients Receiving Chemotherapy   Beginning January 23rd 2017 lab work for the Cancer Center will be done in the  Main lab at [place] on 1st floor. If you have a lab appointment with the Cancer Center please come in thru the  Main Entrance and check in at the main information desk   Today you received the following chemotherapy agents Velcade injection. Follow-up as scheduled  To help prevent nausea and vomiting after your treatment, we encourage you to take your nausea medication   If you develop nausea and vomiting, or diarrhea that is not controlled by your medication, call the clinic.  The clinic phone number is [phone]. Office hours are Monday-Friday 8:30am-5:00pm.  BELOW ARE SYMPTOMS THAT SHOULD BE REPORTED IMMEDIATELY:  *FEVER GREATER THAN 101.0 F  *CHILLS WITH OR WITHOUT FEVER  NAUSEA AND VOMITING THAT IS NOT CONTROLLED WITH YOUR NAUSEA MEDICATION  *UNUSUAL SHORTNESS OF BREATH  *UNUSUAL BRUISING OR BLEEDING  TENDERNESS IN MOUTH AND THROAT WITH OR WITHOUT PRESENCE OF ULCERS  *URINARY PROBLEMS  *BOWEL PROBLEMS  UNUSUAL RASH Items with * indicate a potential emergency and should be followed up as soon as possible. If you have an emergency after office hours please contact your primary care physician or go to the nearest emergency department.  Please call the clinic during office hours if you have any questions or concerns.   You may also contact the Patient Navigator at [phone] should you have any questions or need assistance in obtaining follow up care.      Resources For Cancer Patients and their Caregivers ? American Cancer Society: Can assist with transportation, wigs, general needs, runs Look Good Feel Better.        1-888-227-6333 ? Cancer Care: Provides financial assistance, online support groups, medication/co-pay assistance.  1-800-813-HOPE (4673) ? [facility] Assists [place] cancer patients and their families through emotional , educational and financial support.  336-427-4357 ? [place] DSS Where to apply for food stamps, Medicaid and utility assistance. 336-342-1394 ? [facility]: Transportation to medical appointments. 336-347-2287 ? Social Security Administration: May apply for disability if have a Stage IV cancer. 336-342-7796 1-800-772-1213 ? [place] Aging, Disability and Transit Services: Assists with nutrition, care and transit needs. 336-349-2343         

## 2020-02-04 NOTE — Progress Notes (Signed)
1030 Labs reviewed with Dr. Ellin Saba and pt approved for Velcade injection today per MD                                                       Kim Mills tolerated Velcade injection well without complaints or incident. Pt's daughter mentioned that the pt's urine has had a strong odor recently so order for urine micro put in. Pt unable to obtain the specimen while in clinic so a specimen container sent home with the pt and they can bring it back to the lab later today. VSS Pt discharged via wheelchair in satisfactory condition accompanied by her daughter

## 2020-02-11 ENCOUNTER — Other Ambulatory Visit (HOSPITAL_COMMUNITY): Payer: Self-pay

## 2020-02-11 ENCOUNTER — Other Ambulatory Visit (HOSPITAL_COMMUNITY): Payer: Self-pay | Admitting: Surgery

## 2020-02-11 ENCOUNTER — Inpatient Hospital Stay (HOSPITAL_COMMUNITY): Payer: Medicare Other

## 2020-02-11 ENCOUNTER — Other Ambulatory Visit: Payer: Self-pay

## 2020-02-11 ENCOUNTER — Inpatient Hospital Stay (HOSPITAL_BASED_OUTPATIENT_CLINIC_OR_DEPARTMENT_OTHER): Payer: Medicare Other | Admitting: Hematology

## 2020-02-11 ENCOUNTER — Inpatient Hospital Stay (HOSPITAL_COMMUNITY): Payer: Medicare Other | Attending: Hematology

## 2020-02-11 ENCOUNTER — Encounter (HOSPITAL_COMMUNITY): Payer: Self-pay | Admitting: Hematology

## 2020-02-11 VITALS — BP 140/46 | HR 79 | Temp 96.8°F | Resp 18 | Wt 181.2 lb

## 2020-02-11 DIAGNOSIS — C9000 Multiple myeloma not having achieved remission: Secondary | ICD-10-CM

## 2020-02-11 DIAGNOSIS — Z5112 Encounter for antineoplastic immunotherapy: Secondary | ICD-10-CM | POA: Insufficient documentation

## 2020-02-11 LAB — CBC WITH DIFFERENTIAL/PLATELET
Abs Immature Granulocytes: 0.12 10*3/uL — ABNORMAL HIGH (ref 0.00–0.07)
Basophils Absolute: 0.1 10*3/uL (ref 0.0–0.1)
Basophils Relative: 1 %
Eosinophils Absolute: 0.7 10*3/uL — ABNORMAL HIGH (ref 0.0–0.5)
Eosinophils Relative: 6 %
HCT: 32.9 % — ABNORMAL LOW (ref 36.0–46.0)
Hemoglobin: 10.2 g/dL — ABNORMAL LOW (ref 12.0–15.0)
Immature Granulocytes: 1 %
Lymphocytes Relative: 30 %
Lymphs Abs: 3.7 10*3/uL (ref 0.7–4.0)
MCH: 29.2 pg (ref 26.0–34.0)
MCHC: 31 g/dL (ref 30.0–36.0)
MCV: 94.3 fL (ref 80.0–100.0)
Monocytes Absolute: 1.3 10*3/uL — ABNORMAL HIGH (ref 0.1–1.0)
Monocytes Relative: 10 %
Neutro Abs: 6.6 10*3/uL (ref 1.7–7.7)
Neutrophils Relative %: 52 %
Platelets: 152 10*3/uL (ref 150–400)
RBC: 3.49 MIL/uL — ABNORMAL LOW (ref 3.87–5.11)
RDW: 19.1 % — ABNORMAL HIGH (ref 11.5–15.5)
WBC: 12.4 10*3/uL — ABNORMAL HIGH (ref 4.0–10.5)
nRBC: 0 % (ref 0.0–0.2)

## 2020-02-11 LAB — COMPREHENSIVE METABOLIC PANEL
ALT: 11 U/L (ref 0–44)
AST: 11 U/L — ABNORMAL LOW (ref 15–41)
Albumin: 3.6 g/dL (ref 3.5–5.0)
Alkaline Phosphatase: 73 U/L (ref 38–126)
Anion gap: 10 (ref 5–15)
BUN: 20 mg/dL (ref 8–23)
CO2: 27 mmol/L (ref 22–32)
Calcium: 8.6 mg/dL — ABNORMAL LOW (ref 8.9–10.3)
Chloride: 101 mmol/L (ref 98–111)
Creatinine, Ser: 1.03 mg/dL — ABNORMAL HIGH (ref 0.44–1.00)
GFR, Estimated: 56 mL/min — ABNORMAL LOW (ref >60)
Glucose, Bld: 172 mg/dL — ABNORMAL HIGH (ref 70–99)
Potassium: 3 mmol/L — ABNORMAL LOW (ref 3.5–5.1)
Sodium: 138 mmol/L (ref 135–145)
Total Bilirubin: 0.9 mg/dL (ref 0.3–1.2)
Total Protein: 6.7 g/dL (ref 6.5–8.1)

## 2020-02-11 LAB — URINALYSIS, ROUTINE W REFLEX MICROSCOPIC
Bilirubin Urine: NEGATIVE
Glucose, UA: NEGATIVE mg/dL
Hgb urine dipstick: NEGATIVE
Ketones, ur: NEGATIVE mg/dL
Nitrite: NEGATIVE
Protein, ur: NEGATIVE mg/dL
Specific Gravity, Urine: 1.012 (ref 1.005–1.030)
WBC, UA: >50 WBC/hpf — ABNORMAL HIGH (ref 0–5)
pH: 6 (ref 5.0–8.0)

## 2020-02-11 MED ORDER — CIPROFLOXACIN HCL 500 MG PO TABS: 500.0000 mg | ORAL_TABLET | Freq: Two times a day (BID) | ORAL | 0 refills | Status: DC

## 2020-02-11 MED ORDER — DEXAMETHASONE 4 MG PO TABS
20.0000 mg | ORAL_TABLET | Freq: Once | ORAL | Status: AC
  Administered 2020-02-11: 20 mg via ORAL
  Filled 2020-02-11: qty 5

## 2020-02-11 MED ORDER — BORTEZOMIB CHEMO SQ INJECTION 3.5 MG (2.5MG/ML)
1.3000 mg/m2 | Freq: Once | INTRAMUSCULAR | Status: AC
  Administered 2020-02-11: 2.25 mg via SUBCUTANEOUS
  Filled 2020-02-11: qty 0.9

## 2020-02-11 MED ORDER — POTASSIUM CHLORIDE CRYS ER 20 MEQ PO TBCR
40.0000 meq | EXTENDED_RELEASE_TABLET | Freq: Once | ORAL | Status: AC
  Administered 2020-02-11: 40 meq via ORAL
  Filled 2020-02-11: qty 2

## 2020-02-11 MED ORDER — TRAMADOL HCL 50 MG PO TABS: 50.0000 mg | ORAL_TABLET | Freq: Two times a day (BID) | ORAL | 0 refills | Status: DC | PRN

## 2020-02-11 NOTE — Patient Instructions (Signed)
[facility] at [facility] Discharge Instructions  You were seen today by Dr. Katragadda. He went over your recent results. You received your treatment today; continue getting your weekly treatments. Dr. Katragadda will see you back in 4 weeks for labs and follow up.   Thank you for choosing [facility] at [facility] to provide your oncology and hematology care.  To afford each patient quality time with our provider, please arrive at least 15 minutes before your scheduled appointment time.   If you have a lab appointment with the Cancer Center please come in thru the Main Entrance and check in at the main information desk  You need to re-schedule your appointment should you arrive 10 or more minutes late.  We strive to give you quality time with our providers, and arriving late affects you and other patients whose appointments are after yours.  Also, if you no show three or more times for appointments you may be dismissed from the clinic at the providers discretion.     Again, thank you for choosing [facility].  Our hope is that these requests will decrease the amount of time that you wait before being seen by our physicians.       _____________________________________________________________  Should you have questions after your visit to [facility], please contact our office at [phone] between the hours of 8:00 a.m. and 4:30 p.m.  Voicemails left after 4:00 p.m. will not be returned until the following business day.  For prescription refill requests, have your pharmacy contact our office and allow 72 hours.    Cancer Center Support Programs:   > Cancer Support Group  2nd Tuesday of the month 1pm-2pm, Journey Room    

## 2020-02-11 NOTE — Progress Notes (Signed)
[place], [place] [address]   CLINIC:  Medical Oncology/Hematology  PCP:  Alycia Rossetti, MD [address] [place] [address] [phone]   REASON FOR VISIT:  Follow-up for normocytic anemia & multiple myeloma  PRIOR THERAPY: None  NGS Results: Not done  CURRENT THERAPY: Velcade weekly; Revlimid 2 weeks on, 1 week off  BRIEF ONCOLOGIC HISTORY:  Oncology History  Multiple myeloma not having achieved remission ([place])  10/13/2019 Initial Diagnosis   Multiple myeloma not having achieved remission ([place])   10/29/2019 -  Chemotherapy   The patient had dexamethasone (DECADRON) tablet 20 mg, 20 mg (100 % of original dose 20 mg), Oral,  Once, 5 of 8 cycles Dose modification: 20 mg (original dose 20 mg, Cycle 1) Administration: 20 mg (10/29/2019), 20 mg (11/05/2019), 20 mg (11/12/2019), 20 mg (11/19/2019), 20 mg (11/26/2019), 20 mg (12/03/2019), 20 mg (12/10/2019), 20 mg (12/17/2019), 20 mg (12/24/2019), 20 mg (12/31/2019), 20 mg (01/07/2020), 20 mg (01/14/2020), 20 mg (01/21/2020), 20 mg (01/28/2020), 20 mg (02/04/2020) bortezomib SQ (VELCADE) chemo injection 2.25 mg, 1.3 mg/m2 = 2.25 mg, Subcutaneous,  Once, 5 of 8 cycles Administration: 2.25 mg (10/29/2019), 2.25 mg (11/05/2019), 2.25 mg (11/12/2019), 2.25 mg (11/19/2019), 2.25 mg (11/26/2019), 2.25 mg (12/03/2019), 2.25 mg (12/10/2019), 2.25 mg (12/17/2019), 2.25 mg (12/24/2019), 2.25 mg (12/31/2019), 2.25 mg (01/07/2020), 2.25 mg (01/14/2020), 2.25 mg (01/21/2020), 2.25 mg (01/28/2020), 2.25 mg (02/04/2020)  for chemotherapy treatment.      CANCER STAGING: Cancer Staging No matching staging information was found for the patient.  INTERVAL HISTORY:  Kim Mills, a 80 y.o. female, returns for routine follow-up and consideration for next cycle of chemotherapy. Kim Mills was last seen Sandi Mealy, PA, on 01/28/2020.  Due for cycle #6 of Velcade today.   Today she is accompanied by her  daughter. Overall, she tells me she has been feeling pretty well. She tolerated the previous treatment well. She continues having numbness in her feet, which is stable. She is tolerating the Revlimid well, taking it 2 weeks on with 1 week off, and denies having N/V/D, rashes or itching. She started taking Revlimid on 02/10/2020. She started coughing with clear sputum over the past 1 week and denies having any recent infections. Her appetite is okay and sporadic and she denies abdominal pain, though she complains of having constipation. She complains of having left knee pain, more than the right knee pain.  Overall, she feels ready for next cycle of chemo today.    REVIEW OF SYSTEMS:  Review of Systems  Constitutional: Positive for appetite change (50%) and fatigue (25%).  HENT:   Positive for mouth sores and trouble swallowing (solids).   Respiratory: Positive for cough (w/ clear sputum) and shortness of breath (w/ exertion).   Cardiovascular: Positive for leg swelling (L ankle & knee).  Gastrointestinal: Positive for constipation.  Neurological: Positive for headaches and numbness (feet).  All other systems reviewed and are negative.   PAST MEDICAL/SURGICAL HISTORY:  Past Medical History:  Diagnosis Date  . Arthritis   . Cataract   . Chest pain    Associated with weakness and fatigue  . Diabetes mellitus    A1c of 7.4 in 08/2010  . GERD (gastroesophageal reflux disease)   . Glaucoma   . Hyperlipidemia    Lipid profile in 08/2010:190, 121, 48, 118; normal CBC and CMet  . Hypertension    Lipid profile in 08/2010:190, 121, 48, 118; normal  CBC and CMet  . Hypothyroidism   . Hypothyroidism   . Obesity    Past Surgical History:  Procedure Laterality Date  . CHOLECYSTECTOMY    . COLONOSCOPY N/A 04/01/2014   RMR: Melanosis coli. colonic polyps removed as described above.   . CYSTOSCOPY W/ URETERAL STENT PLACEMENT Right 09/13/2019   Procedure: CYSTOSCOPY WITH RIGHT  RETROGRADE PYELOGRAM;  BLADDER BIOPSY;  Surgeon: Cleon Gustin, MD;  Location: [facility];  Service: Urology;  Laterality: Right;  . LEFT HEART CATH AND CORONARY ANGIOGRAPHY N/A 10/07/2017   Procedure: LEFT HEART CATH AND CORONARY ANGIOGRAPHY;  Surgeon: Troy Sine, MD;  Location: [facility];  Service: Cardiovascular;  Laterality: N/A;  . THYROIDECTOMY, PARTIAL      SOCIAL HISTORY:  Social History   Socioeconomic History  . Marital status: Widowed    Spouse name: Not on file  . Number of children: 7  . Years of education: Not on file  . Highest education level: Not on file  Occupational History    Employer: RETIRED  Tobacco Use  . Smoking status: Passive Smoke Exposure - Never Smoker  . Smokeless tobacco: Never Used  Vaping Use  . Vaping Use: Never used  Substance and Sexual Activity  . Alcohol use: Never  . Drug use: Never  . Sexual activity: Not Currently  Other Topics Concern  . Not on file  Social History Narrative  . Not on file   Social Determinants of Health   Financial Resource Strain: Not on file  Food Insecurity: Not on file  Transportation Needs: No Transportation Needs  . Lack of Transportation (Medical): No  . Lack of Transportation (Non-Medical): No  Physical Activity: Inactive  . Days of Exercise per Week: 0 days  . Minutes of Exercise per Session: 0 min  Stress: Not on file  Social Connections: Not on file  Intimate Partner Violence: Not At Risk  . Fear of Current or Ex-Partner: No  . Emotionally Abused: No  . Physically Abused: No  . Sexually Abused: No    FAMILY HISTORY:  Family History  Problem Relation Age of Onset  . Anemia Father   . Arthritis Mother   . Stroke Brother   . Cancer Sister        unknown kind  . Diabetes Sister   . Dementia Sister   . Cancer Niece        Breast  . Cancer Brother   . Healthy Son   . Healthy Son   . Healthy Son   . Healthy Daughter   . Healthy Daughter   . Healthy Daughter   . Sarcoidosis Daughter      CURRENT MEDICATIONS:  Current Outpatient Medications  Medication Sig Dispense Refill  . acyclovir (ZOVIRAX) 400 MG tablet Take 1 tablet (400 mg total) by mouth 2 (two) times daily. 60 tablet 6  . amLODipine (NORVASC) 10 MG tablet Take 1 tablet (10 mg total) by mouth daily. 90 tablet 3  . ASPIRIN 81 PO Take 1 tablet by mouth daily.    . benzonatate (TESSALON PERLES) 100 MG capsule Take 1 capsule (100 mg total) by mouth 2 (two) times daily as needed for cough. 20 capsule 0  . Blood Glucose Monitoring Suppl (BLOOD GLUCOSE SYSTEM PAK) KIT Please dispense based on patient and insurance preference. Use as directed to monitor FSBS 2x daily. Dx: E11.9. 1 each 1  . fluticasone (FLONASE) 50 MCG/ACT nasal spray Place 2 sprays into both nostrils daily. 16 g  6  . gabapentin (NEURONTIN) 300 MG capsule Take 1 capsule (300 mg total) by mouth at bedtime. 90 capsule 3  . glipiZIDE (GLUCOTROL) 5 MG tablet Take 1 tablet (5 mg total) by mouth 2 (two) times daily. 180 tablet 3  . Glucose Blood (BLOOD GLUCOSE TEST STRIPS) STRP Please dispense based on patient and insurance preference. Use as directed to monitor FSBS 2x daily. Dx: E11.9. 100 each 11  . HM LORATADINE 10 MG tablet TAKE ONE TABLET (10MG TOTAL) BY MOUTH DAILY 30 tablet 0  . hydrochlorothiazide (MICROZIDE) 12.5 MG capsule TAKE ONE CAPSULE BY MOUTH DAILY (Patient taking differently: Take 12.5 mg by mouth daily.) 90 capsule 3  . HYDROcodone-acetaminophen (NORCO/VICODIN) 5-325 MG tablet Take 1 tablet by mouth every 6 (six) hours as needed for moderate pain. 60 tablet 0  . Lancets MISC Please dispense based on patient and insurance preference. Use as directed to monitor FSBS 2x daily. Dx: E11.9. 100 each 11  . lenalidomide (REVLIMID) 15 MG capsule Take 1 capsule (15 mg total) by mouth daily. 14 capsule 0  . magic mouthwash w/lidocaine SOLN Take 5 mLs by mouth 4 (four) times daily. 450 mL 0  . meclizine (ANTIVERT) 25 MG tablet Take 25 mg by mouth every 6  (six) hours as needed for dizziness.     . metFORMIN (GLUCOPHAGE) 1000 MG tablet TAKE ONE TABLET BY MOUTH TWICE A DAY 60 tablet 3  . metoprolol tartrate (LOPRESSOR) 25 MG tablet Take 0.5 tablets (12.5 mg total) by mouth 2 (two) times daily. 90 tablet 3  . omeprazole (PRILOSEC) 40 MG capsule TAKE ONE CAPSULE BY MOUTH DAILY (Patient taking differently: Take 40 mg by mouth daily.) 90 capsule 3  . potassium chloride SA (KLOR-CON) 20 MEQ tablet Take 1 tablet (20 mEq total) by mouth 2 (two) times daily. 10 tablet 0  . prochlorperazine (COMPAZINE) 10 MG tablet Take 1 tablet (10 mg total) by mouth every 6 (six) hours as needed (Nausea or vomiting). 30 tablet 1  . simvastatin (ZOCOR) 40 MG tablet TAKE ONE TABLET (40MG TOTAL) BY MOUTH BEDTIME (Patient taking differently: Take 40 mg by mouth at bedtime.) 90 tablet 3  . SYNTHROID 137 MCG tablet Take 137 mcg by mouth every morning.    . tolterodine (DETROL LA) 4 MG 24 hr capsule TAKE ONE CAPSULE BY MOUTH DAILY (Patient taking differently: Take 4 mg by mouth daily.) 90 capsule 3  . traMADol (ULTRAM) 50 MG tablet Take 1 tablet (50 mg total) by mouth every 6 (six) hours as needed. 30 tablet 0   No current facility-administered medications for this visit.    ALLERGIES:  No Known Allergies  PHYSICAL EXAM:  Performance status (ECOG): 2 - Symptomatic, <50% confined to bed  Vitals:   02/11/20 1029  BP: (!) 140/46  Pulse: 79  Resp: 18  Temp: (!) 96.8 F (36 C)  SpO2: 98%   Wt Readings from Last 3 Encounters:  02/11/20 181 lb 3.2 oz (82.2 kg)  01/28/20 182 lb 9.6 oz (82.8 kg)  01/21/20 178 lb 9.6 oz (81 kg)   Physical Exam Vitals reviewed.  Constitutional:      Appearance: Normal appearance. She is obese.     Comments: Wheelchair bound  Cardiovascular:     Rate and Rhythm: Normal rate and regular rhythm.     Pulses: Normal pulses.     Heart sounds: Normal heart sounds.  Pulmonary:     Effort: Pulmonary effort is normal.     Breath sounds:  Normal breath sounds.  Musculoskeletal:     Right lower leg: Edema (trace) present.     Left lower leg: No edema.  Neurological:     General: No focal deficit present.     Mental Status: She is alert and oriented to person, place, and time.  Psychiatric:        Mood and Affect: Mood normal.        Behavior: Behavior normal.     LABORATORY DATA:  I have reviewed the labs as listed.  CBC Latest Ref Rng & Units 02/11/2020 02/04/2020 01/28/2020  WBC 4.0 - 10.5 K/uL 12.4(H) 8.3 8.4  Hemoglobin 12.0 - 15.0 g/dL 10.2(L) 10.0(L) 10.1(L)  Hematocrit 36.0 - 46.0 % 32.9(L) 32.0(L) 32.9(L)  Platelets 150 - 400 K/uL 152 242 241   CMP Latest Ref Rng & Units 02/11/2020 02/04/2020 01/28/2020  Glucose 70 - 99 mg/dL 172(H) 194(H) 205(H)  BUN 8 - 23 mg/dL _0 Creatinine 0.44 - 1.00 mg/dL 1.03(H) 1.02(H) 1.04(H)  Sodium 135 - 145 mmol/L 138 140 138  Potassium 3.5 - 5.1 mmol/L 3.0(L) 3.6 3.7  Chloride 98 - 111 mmol/L 101 104 102  CO2 22 - 32 mmol/L _1 Calcium 8.9 - 10.3 mg/dL 8.6(L) 8.8(L) 8.9  Total Protein 6.5 - 8.1 g/dL 6.7 6.4(L) 6.9  Total Bilirubin 0.3 - 1.2 mg/dL 0.9 1.0 0.7  Alkaline Phos 38 - 126 U/L 73 65 70  AST 15 - 41 U/L 11(L) 13(L) 13(L)  ALT 0 - 44 U/L _2 DIAGNOSTIC IMAGING:  I have independently reviewed the scans and discussed with the patient. DG OP Swallowing Func-Medicare/Speech Path  Result Date: 01/16/2020 [facility] [address] [place], [place], [address] Phone: [phone]   Fax:  [phone]  Modified Barium Swallow  Patient Details Name: Kim Mills MRN: [id number] Date of Birth: 11/21/1940 No data recorded  Encounter Date: 01/15/2020        End of Session - 01/15/20 1742        Visit Number 1    Number of Visits 1    Authorization Type Medicare    SLP Start Time 1330    SLP Stop Time  9024    SLP Time Calculation (min) 28 min    Activity Tolerance Patient tolerated treatment well            Past Medical History:  Diagnosis Date . Arthritis   . Cataract   . Chest pain     Associated with weakness and fatigue . Diabetes mellitus     A1c of 7.4 in 08/2010 . GERD (gastroesophageal reflux disease)   . Glaucoma   . Hyperlipidemia     Lipid profile in 08/2010:190, 121, 48, 118; normal CBC and CMet . Hypertension     Lipid profile in 08/2010:190, 121, 48, 118; normal CBC and CMet . Hypothyroidism   . Hypothyroidism   . Obesity         Past Surgical History: Procedure Laterality Date . CHOLECYSTECTOMY     . COLONOSCOPY N/A 04/01/2014   RMR: Melanosis coli. colonic polyps removed as described above.  . CYSTOSCOPY W/ URETERAL STENT PLACEMENT Right 09/13/2019   Procedure: CYSTOSCOPY WITH RIGHT  RETROGRADE PYELOGRAM; BLADDER BIOPSY;  Surgeon: Cleon Gustin, MD;  Location: [facility];  Service: Urology;  Laterality: Right; . LEFT HEART CATH AND CORONARY ANGIOGRAPHY N/A 10/07/2017   Procedure: LEFT HEART CATH AND CORONARY ANGIOGRAPHY;  Surgeon: Claiborne Billings,  Joyice Faster, MD;  Location: [facility];  Service: Cardiovascular;  Laterality: N/A; . THYROIDECTOMY, PARTIAL       There were no vitals filed for this visit.        Subjective Assessment - 01/15/20 1735        Subjective "Sometimes I can get choked on my saliva."    Special Tests MBSS    Currently in Pain? No/denies                General - 01/15/20 1736          General Information   Date of Onset 01/09/20    HPI Orelia Brandstetter is a 80 yo female who was referred for MBSS by Faythe Casa, NP (Oncology) due to Pt reports of difficulty swallowing for the past year.    Type of Study MBS-Modified Barium Swallow Study    Diet Prior to this Study Regular;Thin liquids    Temperature Spikes Noted No    Respiratory Status Room air    History of Recent Intubation No    Behavior/Cognition Alert;Cooperative;Pleasant mood    Oral Cavity Assessment Within Functional Limits    Oral Care Completed by SLP No    Oral Cavity - Dentition Adequate natural dentition    Vision Functional for self feeding     Self-Feeding Abilities Able to feed self    Patient Positioning Upright in chair    Baseline Vocal Quality Normal    Volitional Cough Strong    Volitional Swallow Able to elicit    Anatomy Within functional limits    Pharyngeal Secretions Not observed secondary MBS                Oral Preparation/Oral Phase - 01/15/20 1738          Oral Preparation/Oral Phase   Oral Phase Impaired      Oral - Solids   Oral - Pill Absent A-P transit untitled image  unable to propel pill to posterior oral cavity with water or puree     Electrical stimulation - Oral Phase   Was Electrical Stimulation Used No               Pharyngeal Phase - 01/15/20 1739          Pharyngeal Phase   Pharyngeal Phase Impaired      Pharyngeal - Thin   Pharyngeal- Thin Teaspoon Within functional limits;Pharyngeal residue - valleculae;Pharyngeal residue - pyriform untitled image  trace residuals, clear with dry swallow   Pharyngeal- Thin Cup Swallow initiation at pyriform sinus;Delayed swallow initiation;Penetration/Aspiration before swallow;Pharyngeal residue - valleculae;Pharyngeal residue - pyriform    Pharyngeal Material does not enter airway;Material enters airway, remains ABOVE vocal cords then ejected out    Pharyngeal- Thin Straw Swallow initiation at pyriform sinus      Pharyngeal - Solids   Pharyngeal- Puree Swallow initiation at vallecula    Pharyngeal- Regular Delayed swallow initiation-vallecula    Pharyngeal- Pill Not tested      Electrical Stimulation - Pharyngeal Phase   Was Electrical Stimulation Used No               Cricopharyngeal Phase - 01/15/20 1741          Cervical Esophageal Phase   Cervical Esophageal Phase Within functional limits                Plan - 01/15/20 1742        Clinical Impression Statement Pt presents with mild  oral phase and normal for age pharyngeal phase characterized by difficulty with oral anterior posterior transit of barium tablet, which was presented in both puree and with thin liquids. Pt had to expectorate  the pill. Pharyngeal phase is marked by min delay in swallow initiation with swallow trigger at the pyriforms with liquids resulting in flash penetration without aspiration and trace to min residuals in the valleculae and pyriforms. These residuals were cleared with a dry swallow (sometimes she required a verbal cue to clear). Pt does report an element of xerostomia, which can negatively impact oral phase of swallow (difficult to trigger swallow). Pt indicates that she has never had an easy time swallowing pills, but feels that it is more difficult now. Recommend regular textures and thin liquids, swallow 2x for each sip to clear pharynx, and try po medications whole in puree and/or crush as able. No further SLP services indicated at this time. Pt and daughter are in agreement with plan of care.    Consulted and Agree with Plan of Care Patient         Patient will benefit from skilled therapeutic intervention in order to improve the following deficits and impairments:  Dysphagia, oropharyngeal phase         Recommendations/Treatment - 01/15/20 1741          Swallow Evaluation Recommendations   SLP Diet Recommendations Thin;Age appropriate regular    Liquid Administration via Cup;Straw    Medication Administration Whole meds with puree    Supervision Patient able to self feed    Compensations Multiple dry swallows after each bite/sip    Postural Changes Seated upright at 90 degrees;Remain upright for at least 30 minutes after feeds/meals               Prognosis - 01/15/20 1741          Prognosis   Prognosis for Safe Diet Advancement Good      Individuals Consulted   Consulted and Agree with Results and Recommendations Patient;Family member/caregiver    Family Member Consulted daughter    Report Sent to  Referring physician         Problem List    Patient Active Problem List   Diagnosis Date Noted . Multiple myeloma ([place]) 10/13/2019 . Goals of care, counseling/discussion 10/13/2019 . Multiple myeloma not having  achieved remission ([place]) 10/13/2019 . Monoclonal gammopathy 09/12/2019 . Gross hematuria 07/02/2019 . CKD (chronic kidney disease) stage 3, GFR 30-59 ml/min (HCC) 04/18/2019 . Vitamin D deficiency 04/17/2019 . B12 deficiency 04/12/2018 . Normocytic anemia 04/12/2018 . Carotid artery disease without cerebral infarction ([place]) 04/12/2018 . Gait disorder 01/11/2018 . Diabetic neuropathy ([place]) 01/10/2018 . Bilateral carotid bruits 11/28/2017 . CAD (coronary artery disease) 11/23/2017 . Elevated troponin   . SVT (supraventricular tachycardia) ([place]) 10/05/2017 . History of colonic polyps   . Encounter for screening colonoscopy 03/13/2014 . Constipation 03/13/2014 . GERD (gastroesophageal reflux disease) 03/13/2014 . Hyperlipidemia   . Type 2 diabetes mellitus ([place])   . OA (osteoarthritis) of knee   . Hypothyroidism   . Chest pain   . Obesity   . Hypertension    Thank you,  Genene Churn, [facility] 01/15/2020, 5:46 PM  [place], [place], [address] Phone: [phone]   Fax:  [phone]  Name: Kim Mills MRN: [id number] Date of Birth: April 13, 1940 CLINICAL DATA:  Dysphagia. Difficulty swallowing solids and liquids. History stroke EXAM: MODIFIED BARIUM SWALLOW TECHNIQUE: Different consistencies  of barium were administered orally to the patient by the Speech Pathologist. Imaging of the pharynx was performed in the lateral projection. The radiologist was present in the fluoroscopy room for this study, providing personal supervision. FLUOROSCOPY TIME:  Fluoroscopy Time:  2 minutes 42 seconds Radiation Exposure Index (if provided by the fluoroscopic device): 18.6 mGy Number of Acquired Spot Images: multiple fluoroscopic screen captures COMPARISON:  None FINDINGS: With thin barium, minimal laryngeal penetration is seen. No aspiration. Minimal vallecular and piriform sinus residuals, cleared by a second swallow. No abnormalities  identified with applesauce or cracker consistency. Patient was unable to swallow the 12.5 mm diameter barium tablet with thin liquid or with applesauce. IMPRESSION: Minimal swallowing dysfunction as above. Please refer to the Speech Pathologists report for complete details and recommendations. Electronically Signed   By: Lavonia Dana M.D.   On: 01/15/2020 14:36     ASSESSMENT:  1. Normocytic anemia: -Patient seen at the request of Dr. Buelah Manis for further work-up and management of normocytic anemia. -Recent CBC on 08/21/2019 shows hemoglobin 6.9 with MCV of 88.3. White count and platelets are normal. No history of CKD. Denies any bleeding per rectum or melena. Last colonoscopy on 04/01/2014 shows diffusely pigmented rectal mucosa consistent with melanosis [name] Diffusely pigmented colonic mucosa, 2 diminutive polyps in the mid ascending segment, otherwise the remainder of the colon mucosa was normal. Biopsy consistent with tubular adenoma. -CT renal study on 05/11/2019 shows normal-sized spleen with normal liver. No other abnormal adenopathy. -Denies any prior history of blood transfusion. She is currently taking iron tablet twice daily.  2. IgG lambda plasma cell myeloma: -Skeletal survey on 09/12/2019 shows diffuse faint lucencies in the skull, bilateral femurs. -Bone marrow biopsy on 09/27/2019 shows 70% plasma cells in hypercellular marrow. Chromosome analysis was 12, XX.FISH panel was normal. However quality of specimen is compromised. -LDH normal, beta-2 microglobulin 2.9. M spike was 2.1 g. Kappa light chains 19.7, lambda light chains 14.3 with ratio of 1.38. -24-hour urine shows nonnephrotic range proteinuria. Urine immunofixation was positive.   PLAN:  1.IgG lambda plasma cell myeloma: -She is tolerating Revlimid and Velcade very well.  Revlimid is 15 mg 2 weeks on/1 week off. -She is also tolerating dexamethasone 20 mg weekly very well.  She has  completed 5 cycles. -Reviewed myeloma labs from 01/07/2020 which showed M spike improved to 0.3 g (previously 2.4 g).  Kappa light chains are 41 with ratio of 1.69. -Proceed with cycle 6.  CBC shows normal white count and platelets. -RTC 4 weeks.  If the myeloma panel is in remission, will consider discontinuing Velcade. -Patient not a transplant candidate. -Patient reported cloudy urine.  We have checked a UA which showed signs of infection.  I have sent Cipro 500 mg twice daily for 7 days.  2. Diabetes: -Continue glipizide and Metformin.  3. Hypertension: -Continue amlodipine and HCTZ.  4. Hypothyroidism: -Continue Synthroid daily.  5. B12 deficiency: -Continue monthly B12 injections.  6.  Left knee pain: -Prescription for tramadol 50 mg to be taken every 12 hours as needed sent.  7. Infection prophylaxis: -Continue acyclovir 400 mg twice daily and aspirin 81 mg daily.  8.  Normocytic anemia: -Hemoglobin has improved to 10.2.   Orders placed this encounter:  Orders Placed This Encounter  Procedures  . Protein electrophoresis, serum  . Kappa/lambda light chains  . CBC with Differential/Platelet  . Comprehensive metabolic panel  . Magnesium  . Protein electrophoresis, serum  . Kappa/lambda light chains  Draiden Mirsky, MD [facility] [phone]   I, Daniel Khashchuk, am acting as a scribe for Dr. Lenaya Pietsch [place].  I, Rifka Ramey MD, have reviewed the above documentation for accuracy and completeness, and I agree with the above.     

## 2020-02-11 NOTE — Patient Instructions (Signed)
[facility] Discharge Instructions for Patients Receiving Chemotherapy  Today you received the following chemotherapy agents   To help prevent nausea and vomiting after your treatment, we encourage you to take your nausea medication   If you develop nausea and vomiting that is not controlled by your nausea medication, call the clinic.   BELOW ARE SYMPTOMS THAT SHOULD BE REPORTED IMMEDIATELY:  *FEVER GREATER THAN 100.5 F  *CHILLS WITH OR WITHOUT FEVER  NAUSEA AND VOMITING THAT IS NOT CONTROLLED WITH YOUR NAUSEA MEDICATION  *UNUSUAL SHORTNESS OF BREATH  *UNUSUAL BRUISING OR BLEEDING  TENDERNESS IN MOUTH AND THROAT WITH OR WITHOUT PRESENCE OF ULCERS  *URINARY PROBLEMS  *BOWEL PROBLEMS  UNUSUAL RASH Items with * indicate a potential emergency and should be followed up as soon as possible.  Feel free to call the clinic should you have any questions or concerns. The clinic phone number is [phone].  Please show the CHEMO ALERT CARD at check-in to the Emergency Department and triage nurse.   

## 2020-02-11 NOTE — Progress Notes (Signed)
Velcade injection given today per MD orders.  Tolerated injection without adverse affects. Injection site WNL.  Vital signs stable.  No complaints at this time.  Discharge from clinic via wheelchair in stable condition.  Alert and oriented X 3.  Follow up with [facility] as scheduled.

## 2020-02-11 NOTE — Progress Notes (Signed)
Potassium 40 MEQ x 1 today and patient is good for treatment today per Dr. Ellin Saba.

## 2020-02-12 LAB — KAPPA/LAMBDA LIGHT CHAINS
Kappa free light chain: 34.8 mg/L — ABNORMAL HIGH (ref 3.3–19.4)
Kappa, lambda light chain ratio: 1.43 (ref 0.26–1.65)
Lambda free light chains: 24.3 mg/L (ref 5.7–26.3)

## 2020-02-13 LAB — PROTEIN ELECTROPHORESIS, SERUM
A/G Ratio: 1.2 (ref 0.7–1.7)
Albumin ELP: 3.4 g/dL (ref 2.9–4.4)
Alpha-1-Globulin: 0.2 g/dL (ref 0.0–0.4)
Alpha-2-Globulin: 0.8 g/dL (ref 0.4–1.0)
Beta Globulin: 1.2 g/dL (ref 0.7–1.3)
Gamma Globulin: 0.7 g/dL (ref 0.4–1.8)
Globulin, Total: 2.9 g/dL (ref 2.2–3.9)
Total Protein ELP: 6.3 g/dL (ref 6.0–8.5)

## 2020-02-18 ENCOUNTER — Encounter (HOSPITAL_COMMUNITY): Payer: Self-pay

## 2020-02-18 ENCOUNTER — Other Ambulatory Visit: Payer: Self-pay

## 2020-02-18 ENCOUNTER — Inpatient Hospital Stay (HOSPITAL_COMMUNITY): Payer: Medicare Other

## 2020-02-18 ENCOUNTER — Other Ambulatory Visit (HOSPITAL_COMMUNITY): Payer: Self-pay

## 2020-02-18 VITALS — BP 141/49 | HR 66 | Temp 97.1°F | Resp 17

## 2020-02-18 DIAGNOSIS — Z5112 Encounter for antineoplastic immunotherapy: Secondary | ICD-10-CM | POA: Diagnosis not present

## 2020-02-18 DIAGNOSIS — C9000 Multiple myeloma not having achieved remission: Secondary | ICD-10-CM | POA: Diagnosis not present

## 2020-02-18 LAB — COMPREHENSIVE METABOLIC PANEL
ALT: 10 U/L (ref 0–44)
AST: 13 U/L — ABNORMAL LOW (ref 15–41)
Albumin: 3.4 g/dL — ABNORMAL LOW (ref 3.5–5.0)
Alkaline Phosphatase: 73 U/L (ref 38–126)
Anion gap: 8 (ref 5–15)
BUN: 13 mg/dL (ref 8–23)
CO2: 30 mmol/L (ref 22–32)
Calcium: 8.9 mg/dL (ref 8.9–10.3)
Chloride: 103 mmol/L (ref 98–111)
Creatinine, Ser: 0.96 mg/dL (ref 0.44–1.00)
GFR, Estimated: >60 mL/min (ref >60)
Glucose, Bld: 220 mg/dL — ABNORMAL HIGH (ref 70–99)
Potassium: 3.4 mmol/L — ABNORMAL LOW (ref 3.5–5.1)
Sodium: 141 mmol/L (ref 135–145)
Total Bilirubin: 0.6 mg/dL (ref 0.3–1.2)
Total Protein: 6.6 g/dL (ref 6.5–8.1)

## 2020-02-18 LAB — CBC WITH DIFFERENTIAL/PLATELET
Abs Immature Granulocytes: 0.18 10*3/uL — ABNORMAL HIGH (ref 0.00–0.07)
Basophils Absolute: 0.1 10*3/uL (ref 0.0–0.1)
Basophils Relative: 1 %
Eosinophils Absolute: 0.8 10*3/uL — ABNORMAL HIGH (ref 0.0–0.5)
Eosinophils Relative: 8 %
HCT: 33.4 % — ABNORMAL LOW (ref 36.0–46.0)
Hemoglobin: 10.1 g/dL — ABNORMAL LOW (ref 12.0–15.0)
Immature Granulocytes: 2 %
Lymphocytes Relative: 27 %
Lymphs Abs: 2.5 10*3/uL (ref 0.7–4.0)
MCH: 28.4 pg (ref 26.0–34.0)
MCHC: 30.2 g/dL (ref 30.0–36.0)
MCV: 93.8 fL (ref 80.0–100.0)
Monocytes Absolute: 0.5 10*3/uL (ref 0.1–1.0)
Monocytes Relative: 6 %
Neutro Abs: 5.3 10*3/uL (ref 1.7–7.7)
Neutrophils Relative %: 56 %
Platelets: 260 10*3/uL (ref 150–400)
RBC: 3.56 MIL/uL — ABNORMAL LOW (ref 3.87–5.11)
RDW: 18.6 % — ABNORMAL HIGH (ref 11.5–15.5)
WBC: 9.3 10*3/uL (ref 4.0–10.5)
nRBC: 0 % (ref 0.0–0.2)

## 2020-02-18 MED ORDER — LENALIDOMIDE 15 MG PO CAPS: 15.0000 mg | ORAL_CAPSULE | Freq: Every day | ORAL | 0 refills | Status: DC

## 2020-02-18 MED ORDER — BORTEZOMIB CHEMO SQ INJECTION 3.5 MG (2.5MG/ML)
1.3000 mg/m2 | Freq: Once | INTRAMUSCULAR | Status: AC
  Administered 2020-02-18: 2.25 mg via SUBCUTANEOUS
  Filled 2020-02-18: qty 0.9

## 2020-02-18 MED ORDER — DEXAMETHASONE 4 MG PO TABS
20.0000 mg | ORAL_TABLET | Freq: Once | ORAL | Status: AC
  Administered 2020-02-18: 20 mg via ORAL
  Filled 2020-02-18: qty 5

## 2020-02-18 NOTE — Telephone Encounter (Signed)
Chart reviewed. Revlimid refilled per Dr. Delton Coombes

## 2020-02-18 NOTE — Progress Notes (Signed)
Patient tolerated Velcade injection with no complaints voiced.  Lab work reviewed.  See MAR for details.  Injection site clean and dry with no bruising or swelling noted.  Band aid applied.  VSS.  Patient left in satisfactory condition with no s/s of distress noted.  

## 2020-02-22 ENCOUNTER — Telehealth: Payer: Self-pay | Admitting: Pharmacist

## 2020-02-22 NOTE — Progress Notes (Addendum)
Chronic Care Management Pharmacy Assistant   Name: Kim Mills  MRN: [id number] DOB: [birth year]  Reason for Encounter: Disease State for HTN.  Patient Questions:  1.  Have you seen any other providers since your last visit? Yes.   2.  Any changes in your medicines or health? Yes.    PCP : Alycia Rossetti, MD   Their chronic conditions include: HTN, CAD, GERD, Type II DM w/ neuropathy, Hypothyroidism, HLD.  Office visits: 12/18/19 Dr. Buelah Manis for acute bronchitis. STOPPED Cyanocobalamin 5,701 mg, Garlic 779 mg, Omega 3, and Sulfamethoxazole Trimethoprin 800-160 mg. DECREASED Levothyroxine Sodium to 137 mg. STARTED Azithromycin 250 mg for 4 days, Benzonatate 100 mg PRN and Fluticasone Propionate 50 MCG/ACT.  Consults: 02/11/20 Oncology Dr. Dirk Dress. STOPPED Azithromycin 250 mg.  STARTED Ciprofloxacin 500 mg. 01/28/20 Oncology Harle Stanford., PA-C Infusion. No medication changes. 01/15/20 Oncology Outpatient Rehab No medication changes.  12/14/19 Oncology Quay Burow, Wandra Feinstein, NP Infusion. No medication changes. 12/10/19 Oncology Quay Burow, Wandra Feinstein, NP Infusion. No medication changes.  Allergies:  No Known Allergies  Medications: Outpatient Encounter Medications as of 02/22/2020  Medication Sig   acyclovir (ZOVIRAX) 400 MG tablet Take 1 tablet (400 mg total) by mouth 2 (two) times daily.   amLODipine (NORVASC) 10 MG tablet Take 1 tablet (10 mg total) by mouth daily.   ASPIRIN 81 PO Take 1 tablet by mouth daily.   benzonatate (TESSALON PERLES) 100 MG capsule Take 1 capsule (100 mg total) by mouth 2 (two) times daily as needed for cough.   Blood Glucose Monitoring Suppl (BLOOD GLUCOSE SYSTEM PAK) KIT Please dispense based on patient and insurance preference. Use as directed to monitor FSBS 2x daily. Dx: E11.9.   ciprofloxacin (CIPRO) 500 MG tablet Take 1 tablet (500 mg total) by mouth 2 (two) times daily.   fluticasone (FLONASE) 50 MCG/ACT nasal spray Place 2 sprays into both  nostrils daily.   gabapentin (NEURONTIN) 300 MG capsule Take 1 capsule (300 mg total) by mouth at bedtime.   glipiZIDE (GLUCOTROL) 5 MG tablet Take 1 tablet (5 mg total) by mouth 2 (two) times daily.   Glucose Blood (BLOOD GLUCOSE TEST STRIPS) STRP Please dispense based on patient and insurance preference. Use as directed to monitor FSBS 2x daily. Dx: E11.9.   HM LORATADINE 10 MG tablet TAKE ONE TABLET (10MG TOTAL) BY MOUTH DAILY   hydrochlorothiazide (MICROZIDE) 12.5 MG capsule TAKE ONE CAPSULE BY MOUTH DAILY (Patient taking differently: Take 12.5 mg by mouth daily.)   HYDROcodone-acetaminophen (NORCO/VICODIN) 5-325 MG tablet Take 1 tablet by mouth every 6 (six) hours as needed for moderate pain.   Lancets MISC Please dispense based on patient and insurance preference. Use as directed to monitor FSBS 2x daily. Dx: E11.9.   lenalidomide (REVLIMID) 15 MG capsule Take 1 capsule (15 mg total) by mouth daily.   magic mouthwash w/lidocaine SOLN Take 5 mLs by mouth 4 (four) times daily.   meclizine (ANTIVERT) 25 MG tablet Take 25 mg by mouth every 6 (six) hours as needed for dizziness.    metFORMIN (GLUCOPHAGE) 1000 MG tablet TAKE ONE TABLET BY MOUTH TWICE A DAY   metoprolol tartrate (LOPRESSOR) 25 MG tablet Take 0.5 tablets (12.5 mg total) by mouth 2 (two) times daily.   omeprazole (PRILOSEC) 40 MG capsule TAKE ONE CAPSULE BY MOUTH DAILY (Patient taking differently: Take 40 mg by mouth daily.)   potassium chloride SA (KLOR-CON) 20 MEQ tablet Take 1 tablet (20 mEq total) by mouth 2 (two)  times daily.   prochlorperazine (COMPAZINE) 10 MG tablet Take 1 tablet (10 mg total) by mouth every 6 (six) hours as needed (Nausea or vomiting).   simvastatin (ZOCOR) 40 MG tablet TAKE ONE TABLET (40MG TOTAL) BY MOUTH BEDTIME (Patient taking differently: Take 40 mg by mouth at bedtime.)   SYNTHROID 137 MCG tablet Take 137 mcg by mouth every morning.   tolterodine (DETROL LA) 4 MG 24 hr capsule TAKE ONE CAPSULE BY MOUTH  DAILY (Patient taking differently: Take 4 mg by mouth daily.)   traMADol (ULTRAM) 50 MG tablet Take 1 tablet (50 mg total) by mouth 2 (two) times daily as needed.   No facility-administered encounter medications on file as of 02/22/2020.    Current Diagnosis: Patient Active Problem List   Diagnosis Date Noted   Multiple myeloma ([place]) 10/13/2019   Goals of care, counseling/discussion 10/13/2019   Multiple myeloma not having achieved remission ([place]) 10/13/2019   Monoclonal gammopathy 09/12/2019   Gross hematuria 07/02/2019   CKD (chronic kidney disease) stage 3, GFR 30-59 ml/min (HCC) 04/18/2019   Vitamin D deficiency 04/17/2019   B12 deficiency 04/12/2018   Normocytic anemia 04/12/2018   Carotid artery disease without cerebral infarction ([place]) 04/12/2018   Gait disorder 01/11/2018   Diabetic neuropathy ([place]) 01/10/2018   Bilateral carotid bruits 11/28/2017   CAD (coronary artery disease) 11/23/2017   Elevated troponin    SVT (supraventricular tachycardia) ([place]) 10/05/2017   History of colonic polyps    Encounter for screening colonoscopy 03/13/2014   Constipation 03/13/2014   GERD (gastroesophageal reflux disease) 03/13/2014   Hyperlipidemia    Type 2 diabetes mellitus (HCC)    OA (osteoarthritis) of knee    Hypothyroidism    Chest pain    Obesity    Hypertension     Goals Addressed   None    Reviewed chart prior to disease state call. Spoke with patient regarding BP  Recent Office Vitals: BP Readings from Last 3 Encounters:  02/18/20 (!) 141/49  02/11/20 (!) 140/46  02/04/20 (!) 134/54   Pulse Readings from Last 3 Encounters:  02/18/20 66  02/11/20 79  02/04/20 67    Wt Readings from Last 3 Encounters:  02/11/20 181 lb 3.2 oz (82.2 kg)  01/28/20 182 lb 9.6 oz (82.8 kg)  01/21/20 178 lb 9.6 oz (81 kg)     Kidney Function Lab Results  Component Value Date/Time   CREATININE 0.96 02/18/2020 02:14 PM   CREATININE 1.03 (H) 02/11/2020 10:02 AM   CREATININE  0.88 08/17/2019 03:47 PM   CREATININE 1.94 (H) 04/17/2019 04:10 PM   GFRNONAA >60 02/18/2020 02:14 PM   GFRNONAA 41 (L) 04/13/2019 12:41 PM   GFRAA >60 11/12/2019 09:50 AM   GFRAA 48 (L) 04/13/2019 12:41 PM    BMP Latest Ref Rng & Units 02/18/2020 02/11/2020 02/04/2020  Glucose 70 - 99 mg/dL 220(H) 172(H) 194(H)  BUN 8 - 23 mg/dL _0 Creatinine 0.44 - 1.00 mg/dL 0.96 1.03(H) 1.02(H)  BUN/Creat Ratio 6 - 22 (calc) - - -  Sodium 135 - 145 mmol/L 141 138 140  Potassium 3.5 - 5.1 mmol/L 3.4(L) 3.0(L) 3.6  Chloride 98 - 111 mmol/L 103 101 104  CO2 22 - 32 mmol/L _1 Calcium 8.9 - 10.3 mg/dL 8.9 8.6(L) 8.8(L)    Current antihypertensive regimen:  Amlodipine 10 mg HCTZ 12.5 mg daily Metoprolol tartrate 25 mg  How often are you checking your Blood Pressure? Patient's daughter stated she doesn't allow  them to check her blood pressure.   Current home BP readings: N/A  What recent interventions/DTPs have been made by any provider to improve Blood Pressure control since last CPP Visit: None.  Any recent hospitalizations or ED visits since last visit with CPP? No.  What diet changes have been made to improve Blood Pressure Control?  Patient's daughter stated she eats good sometimes and sometimes she doesn't. Patient's daughter stated they try to giver her a balanced diet, she's not any particular diet she eats a little but of everything.   What exercise is being done to improve your Blood Pressure Control?  Patients daughter stated she tries to walk a little but she probably only walks less than 50 steps a day.  Adherence Review: Is the patient currently on ACE/ARB medication? No.  Does the patient have >5 day gap between last estimated fill dates? No, CPP Please Check.   Recent Relevant Labs: Lab Results  Component Value Date/Time   HGBA1C 6.9 (H) 08/17/2019 03:47 PM   HGBA1C 7.7 (H) 04/13/2019 12:41 PM    Kidney Function Lab Results  Component Value Date/Time    CREATININE 0.96 02/18/2020 02:14 PM   CREATININE 1.03 (H) 02/11/2020 10:02 AM   CREATININE 0.88 08/17/2019 03:47 PM   CREATININE 1.94 (H) 04/17/2019 04:10 PM   GFRNONAA >60 02/18/2020 02:14 PM   GFRNONAA 41 (L) 04/13/2019 12:41 PM   GFRAA >60 11/12/2019 09:50 AM   GFRAA 48 (L) 04/13/2019 12:41 PM    Current antihyperglycemic regimen:  Glipizide 5 mg twice daily Metformin 1000 mg twice daily  What recent interventions/DTPs have been made to improve glycemic control:  None.  Have there been any recent hospitalizations or ED visits since last visit with CPP? No.  Patient's daughter reports hypoglycemic symptoms, including Shaky, Nervous/irritable and Vision changes   Patient's daughter reports hyperglycemic symptoms, including blurry vision, excessive thirst, fatigue and weakness   How often are you checking your blood sugar? Patients daughter stated she does not like her finger poked so they don't check it, the most they check it is about once a month.    What are your blood sugars ranging?  Fasting: N/A Before meals: N/A After meals: N/A Bedtime: N/A  During the week, how often does your blood glucose drop below 70?  Patients daughter stated she does not like her finger poked so they don't check it, the most they check it is about once a month.    Are you checking your feet daily/regularly?   Patents daughter stated she checks her mothers feet daily.   Adherence Review: Is the patient currently on a STATIN medication? Yes, Simvastatin 40 mg.   Is the patient currently on ACE/ARB medication? No.    Does the patient have >5 day gap between last estimated fill dates? No, CPP Please Check.  Patient's daughter stated they are not having in problems getting her medication at this time.   Follow-Up:  Pharmacist Review   Charlann Lange, RMA Clinical Pharmacist Assistant [phone]  8 minutes spent in review, coordination, and documentation.  Reviewed by: Beverly Milch, PharmD Clinical Pharmacist [facility] [phone]

## 2020-02-25 ENCOUNTER — Ambulatory Visit (HOSPITAL_COMMUNITY): Payer: Medicare Other

## 2020-02-25 ENCOUNTER — Other Ambulatory Visit (HOSPITAL_COMMUNITY): Payer: Medicare Other

## 2020-02-27 ENCOUNTER — Inpatient Hospital Stay (HOSPITAL_COMMUNITY): Payer: Medicare Other

## 2020-02-27 ENCOUNTER — Encounter (HOSPITAL_COMMUNITY): Payer: Self-pay

## 2020-02-27 ENCOUNTER — Other Ambulatory Visit: Payer: Self-pay

## 2020-02-27 VITALS — BP 133/45 | HR 69 | Temp 97.2°F | Resp 18

## 2020-02-27 DIAGNOSIS — C9000 Multiple myeloma not having achieved remission: Secondary | ICD-10-CM

## 2020-02-27 DIAGNOSIS — Z5112 Encounter for antineoplastic immunotherapy: Secondary | ICD-10-CM | POA: Diagnosis not present

## 2020-02-27 LAB — CBC WITH DIFFERENTIAL/PLATELET
Basophils Absolute: 0 10*3/uL (ref 0.0–0.1)
Basophils Relative: 0 %
Blasts: 2 %
Eosinophils Absolute: 0.9 10*3/uL — ABNORMAL HIGH (ref 0.0–0.5)
Eosinophils Relative: 10 %
HCT: 32.5 % — ABNORMAL LOW (ref 36.0–46.0)
Hemoglobin: 10 g/dL — ABNORMAL LOW (ref 12.0–15.0)
Lymphocytes Relative: 36 %
Lymphs Abs: 3.1 10*3/uL (ref 0.7–4.0)
MCH: 28.9 pg (ref 26.0–34.0)
MCHC: 30.8 g/dL (ref 30.0–36.0)
MCV: 93.9 fL (ref 80.0–100.0)
Monocytes Absolute: 0.3 10*3/uL (ref 0.1–1.0)
Monocytes Relative: 4 %
Neutro Abs: 4.2 10*3/uL (ref 1.7–7.7)
Neutrophils Relative %: 48 %
Platelets: 198 10*3/uL (ref 150–400)
RBC: 3.46 MIL/uL — ABNORMAL LOW (ref 3.87–5.11)
RDW: 18.8 % — ABNORMAL HIGH (ref 11.5–15.5)
WBC: 8.7 10*3/uL (ref 4.0–10.5)
nRBC: 0.3 % — ABNORMAL HIGH (ref 0.0–0.2)

## 2020-02-27 LAB — COMPREHENSIVE METABOLIC PANEL
ALT: 14 U/L (ref 0–44)
AST: 13 U/L — ABNORMAL LOW (ref 15–41)
Albumin: 3.2 g/dL — ABNORMAL LOW (ref 3.5–5.0)
Alkaline Phosphatase: 62 U/L (ref 38–126)
Anion gap: 8 (ref 5–15)
BUN: 10 mg/dL (ref 8–23)
CO2: 28 mmol/L (ref 22–32)
Calcium: 8.1 mg/dL — ABNORMAL LOW (ref 8.9–10.3)
Chloride: 106 mmol/L (ref 98–111)
Creatinine, Ser: 0.89 mg/dL (ref 0.44–1.00)
GFR, Estimated: >60 mL/min (ref >60)
Glucose, Bld: 328 mg/dL — ABNORMAL HIGH (ref 70–99)
Potassium: 3.4 mmol/L — ABNORMAL LOW (ref 3.5–5.1)
Sodium: 142 mmol/L (ref 135–145)
Total Bilirubin: 0.6 mg/dL (ref 0.3–1.2)
Total Protein: 5.9 g/dL — ABNORMAL LOW (ref 6.5–8.1)

## 2020-02-27 MED ORDER — BORTEZOMIB CHEMO SQ INJECTION 3.5 MG (2.5MG/ML)
1.3000 mg/m2 | Freq: Once | INTRAMUSCULAR | Status: AC
  Administered 2020-02-27: 2.25 mg via SUBCUTANEOUS
  Filled 2020-02-27: qty 0.9

## 2020-02-27 MED ORDER — DEXAMETHASONE 4 MG PO TABS
20.0000 mg | ORAL_TABLET | Freq: Once | ORAL | Status: AC
  Administered 2020-02-27: 20 mg via ORAL
  Filled 2020-02-27: qty 5

## 2020-02-27 NOTE — Progress Notes (Signed)
Kim Mills tolerated Velcade injection well without complaints or incident. Labs reviewed prior to administering this medication. VSS Pt discharged via wheelchair in satisfactory condition accompanied by her daughter 

## 2020-02-27 NOTE — Patient Instructions (Signed)
[facility] Discharge Instructions for Patients Receiving Chemotherapy   Beginning January 23rd 2017 lab work for the Cancer Center will be done in the  Main lab at [facility] on 1st floor. If you have a lab appointment with the Cancer Center please come in thru the  Main Entrance and check in at the main information desk   Today you received the following chemotherapy agents Velcade. Follow-up as scheduled  To help prevent nausea and vomiting after your treatment, we encourage you to take your nausea medication   If you develop nausea and vomiting, or diarrhea that is not controlled by your medication, call the clinic.  The clinic phone number is [phone]. Office hours are Monday-Friday 8:30am-5:00pm.  BELOW ARE SYMPTOMS THAT SHOULD BE REPORTED IMMEDIATELY:  *FEVER GREATER THAN 101.0 F  *CHILLS WITH OR WITHOUT FEVER  NAUSEA AND VOMITING THAT IS NOT CONTROLLED WITH YOUR NAUSEA MEDICATION  *UNUSUAL SHORTNESS OF BREATH  *UNUSUAL BRUISING OR BLEEDING  TENDERNESS IN MOUTH AND THROAT WITH OR WITHOUT PRESENCE OF ULCERS  *URINARY PROBLEMS  *BOWEL PROBLEMS  UNUSUAL RASH Items with * indicate a potential emergency and should be followed up as soon as possible. If you have an emergency after office hours please contact your primary care physician or go to the nearest emergency department.  Please call the clinic during office hours if you have any questions or concerns.   You may also contact the Patient Navigator at [phone] should you have any questions or need assistance in obtaining follow up care.      Resources For Cancer Patients and their Caregivers ? American Cancer Society: Can assist with transportation, wigs, general needs, runs Look Good Feel Better.        1-888-227-6333 ? Cancer Care: Provides financial assistance, online support groups, medication/co-pay assistance.  1-800-813-HOPE (4673) ? [facility] Assists [place] cancer patients and their families through emotional , educational and financial support.  336-427-4357 ? [place] DSS Where to apply for food stamps, Medicaid and utility assistance. 336-342-1394 ? [facility]: Transportation to medical appointments. 336-347-2287 ? Social Security Administration: May apply for disability if have a Stage IV cancer. 336-342-7796 1-800-772-1213 ? [place] Aging, Disability and Transit Services: Assists with nutrition, care and transit needs. 336-349-2343         

## 2020-02-29 ENCOUNTER — Telehealth: Payer: Self-pay | Admitting: Pharmacist

## 2020-02-29 NOTE — Progress Notes (Signed)
    Chronic Care Management Pharmacy Assistant   Name: BRIEANA SHIMMIN  MRN: [id number] DOB: 1940/11/30  Reason for Encounter: Adherence Review  Patient Questions:  1.  Have you seen any other providers since your last visit? No    2.  Any changes in your medicines or health? No   PCP : Alycia Rossetti, MD  Verified Adherence Gap Information. Per insurance data, the patients total gaps measures equal to 3. The patient has not mer their annual wellness or their wellness bundle screening. Their most recent A1C was 6.9 on 08/17/19.   Follow-Up:  Pharmacist Review

## 2020-03-03 ENCOUNTER — Ambulatory Visit (HOSPITAL_COMMUNITY): Payer: Medicare Other

## 2020-03-03 ENCOUNTER — Other Ambulatory Visit (HOSPITAL_COMMUNITY): Payer: Medicare Other

## 2020-03-05 ENCOUNTER — Inpatient Hospital Stay (HOSPITAL_COMMUNITY): Payer: Medicare Other

## 2020-03-05 ENCOUNTER — Encounter (HOSPITAL_COMMUNITY): Payer: Self-pay

## 2020-03-05 ENCOUNTER — Other Ambulatory Visit: Payer: Self-pay

## 2020-03-05 VITALS — BP 155/86 | HR 83 | Temp 97.3°F | Resp 18

## 2020-03-05 DIAGNOSIS — C9000 Multiple myeloma not having achieved remission: Secondary | ICD-10-CM

## 2020-03-05 DIAGNOSIS — Z5112 Encounter for antineoplastic immunotherapy: Secondary | ICD-10-CM | POA: Diagnosis not present

## 2020-03-05 LAB — CBC WITH DIFFERENTIAL/PLATELET
Abs Immature Granulocytes: 0.14 10*3/uL — ABNORMAL HIGH (ref 0.00–0.07)
Basophils Absolute: 0.1 10*3/uL (ref 0.0–0.1)
Basophils Relative: 1 %
Eosinophils Absolute: 1.1 10*3/uL — ABNORMAL HIGH (ref 0.0–0.5)
Eosinophils Relative: 13 %
HCT: 34.1 % — ABNORMAL LOW (ref 36.0–46.0)
Hemoglobin: 10.6 g/dL — ABNORMAL LOW (ref 12.0–15.0)
Immature Granulocytes: 2 %
Lymphocytes Relative: 35 %
Lymphs Abs: 3.1 10*3/uL (ref 0.7–4.0)
MCH: 29.2 pg (ref 26.0–34.0)
MCHC: 31.1 g/dL (ref 30.0–36.0)
MCV: 93.9 fL (ref 80.0–100.0)
Monocytes Absolute: 0.9 10*3/uL (ref 0.1–1.0)
Monocytes Relative: 11 %
Neutro Abs: 3.5 10*3/uL (ref 1.7–7.7)
Neutrophils Relative %: 38 %
Platelets: 164 10*3/uL (ref 150–400)
RBC: 3.63 MIL/uL — ABNORMAL LOW (ref 3.87–5.11)
RDW: 19.2 % — ABNORMAL HIGH (ref 11.5–15.5)
WBC: 8.9 10*3/uL (ref 4.0–10.5)
nRBC: 0.4 % — ABNORMAL HIGH (ref 0.0–0.2)

## 2020-03-05 LAB — COMPREHENSIVE METABOLIC PANEL
ALT: 15 U/L (ref 0–44)
AST: 14 U/L — ABNORMAL LOW (ref 15–41)
Albumin: 3.4 g/dL — ABNORMAL LOW (ref 3.5–5.0)
Alkaline Phosphatase: 71 U/L (ref 38–126)
Anion gap: 11 (ref 5–15)
BUN: 13 mg/dL (ref 8–23)
CO2: 25 mmol/L (ref 22–32)
Calcium: 8.8 mg/dL — ABNORMAL LOW (ref 8.9–10.3)
Chloride: 102 mmol/L (ref 98–111)
Creatinine, Ser: 0.85 mg/dL (ref 0.44–1.00)
GFR, Estimated: >60 mL/min (ref >60)
Glucose, Bld: 235 mg/dL — ABNORMAL HIGH (ref 70–99)
Potassium: 3.6 mmol/L (ref 3.5–5.1)
Sodium: 138 mmol/L (ref 135–145)
Total Bilirubin: 1.1 mg/dL (ref 0.3–1.2)
Total Protein: 6.3 g/dL — ABNORMAL LOW (ref 6.5–8.1)

## 2020-03-05 LAB — MAGNESIUM: Magnesium: 1.4 mg/dL — ABNORMAL LOW (ref 1.7–2.4)

## 2020-03-05 MED ORDER — DEXAMETHASONE 4 MG PO TABS
20.0000 mg | ORAL_TABLET | Freq: Once | ORAL | Status: AC
Start: 2020-03-05 — End: 2020-03-05
  Administered 2020-03-05: 20 mg via ORAL
  Filled 2020-03-05: qty 5

## 2020-03-05 MED ORDER — BORTEZOMIB CHEMO SQ INJECTION 3.5 MG (2.5MG/ML)
1.3000 mg/m2 | Freq: Once | INTRAMUSCULAR | Status: AC
  Administered 2020-03-05: 2.25 mg via SUBCUTANEOUS
  Filled 2020-03-05: qty 0.9

## 2020-03-05 MED ORDER — MAGNESIUM OXIDE 400 (241.3 MG) MG PO TABS: 400.0000 mg | ORAL_TABLET | Freq: Two times a day (BID) | ORAL | 0 refills | Status: DC

## 2020-03-05 NOTE — Patient Instructions (Signed)
[facility] Discharge Instructions for Patients Receiving Chemotherapy   Beginning January 23rd 2017 lab work for the Cancer Center will be done in the  Main lab at [facility] on 1st floor. If you have a lab appointment with the Cancer Center please come in thru the  Main Entrance and check in at the main information desk   Today you received the following chemotherapy agents Velcade injection. Follow-up as scheduled  To help prevent nausea and vomiting after your treatment, we encourage you to take your nausea medication   If you develop nausea and vomiting, or diarrhea that is not controlled by your medication, call the clinic.  The clinic phone number is [phone]. Office hours are Monday-Friday 8:30am-5:00pm.  BELOW ARE SYMPTOMS THAT SHOULD BE REPORTED IMMEDIATELY:  *FEVER GREATER THAN 101.0 F  *CHILLS WITH OR WITHOUT FEVER  NAUSEA AND VOMITING THAT IS NOT CONTROLLED WITH YOUR NAUSEA MEDICATION  *UNUSUAL SHORTNESS OF BREATH  *UNUSUAL BRUISING OR BLEEDING  TENDERNESS IN MOUTH AND THROAT WITH OR WITHOUT PRESENCE OF ULCERS  *URINARY PROBLEMS  *BOWEL PROBLEMS  UNUSUAL RASH Items with * indicate a potential emergency and should be followed up as soon as possible. If you have an emergency after office hours please contact your primary care physician or go to the nearest emergency department.  Please call the clinic during office hours if you have any questions or concerns.   You may also contact the Patient Navigator at [phone] should you have any questions or need assistance in obtaining follow up care.      Resources For Cancer Patients and their Caregivers ? American Cancer Society: Can assist with transportation, wigs, general needs, runs Look Good Feel Better.        1-888-227-6333 ? Cancer Care: Provides financial assistance, online support groups, medication/co-pay assistance.  1-800-813-HOPE (4673) ? [facility] Assists [place] cancer patients and their families through emotional , educational and financial support.  336-427-4357 ? [place] DSS Where to apply for food stamps, Medicaid and utility assistance. 336-342-1394 ? [facility]: Transportation to medical appointments. 336-347-2287 ? Social Security Administration: May apply for disability if have a Stage IV cancer. 336-342-7796 1-800-772-1213 ? [place] Aging, Disability and Transit Services: Assists with nutrition, care and transit needs. 336-349-2343         

## 2020-03-05 NOTE — Progress Notes (Signed)
1400 Labs reviewed with Dr. Delton Coombes and pt approved for Velcade injection as well as Mag Ox 400 mg PO BID to be sent in per MD order for Magnesium of 1.4.                                           Kim Mills tolerated Velcade injection well without complaints or incident. VSS Pt discharged via wheelchair in satisfactory condition accompanied by her daughter

## 2020-03-10 ENCOUNTER — Other Ambulatory Visit (HOSPITAL_COMMUNITY): Payer: Self-pay

## 2020-03-10 ENCOUNTER — Ambulatory Visit (HOSPITAL_COMMUNITY): Payer: Medicare Other

## 2020-03-10 ENCOUNTER — Other Ambulatory Visit: Payer: Self-pay | Admitting: Family Medicine

## 2020-03-10 ENCOUNTER — Ambulatory Visit (HOSPITAL_COMMUNITY): Payer: Medicare Other | Admitting: Hematology

## 2020-03-10 ENCOUNTER — Other Ambulatory Visit (HOSPITAL_COMMUNITY): Payer: Medicare Other

## 2020-03-10 MED ORDER — LENALIDOMIDE 15 MG PO CAPS: 15.0000 mg | ORAL_CAPSULE | Freq: Every day | ORAL | 0 refills | Status: DC

## 2020-03-10 NOTE — Telephone Encounter (Signed)
Chart reviewed. Revlimid refilled per Dr. Delton Coombes.

## 2020-03-12 ENCOUNTER — Inpatient Hospital Stay (HOSPITAL_COMMUNITY): Payer: Medicare Other

## 2020-03-12 ENCOUNTER — Other Ambulatory Visit: Payer: Self-pay

## 2020-03-12 ENCOUNTER — Inpatient Hospital Stay (HOSPITAL_COMMUNITY): Payer: Medicare Other | Attending: Hematology

## 2020-03-12 ENCOUNTER — Ambulatory Visit (HOSPITAL_COMMUNITY): Payer: Medicare Other

## 2020-03-12 ENCOUNTER — Other Ambulatory Visit (HOSPITAL_COMMUNITY): Payer: Medicare Other

## 2020-03-12 ENCOUNTER — Encounter (HOSPITAL_COMMUNITY): Payer: Self-pay

## 2020-03-12 VITALS — BP 141/70 | HR 70 | Temp 97.1°F | Resp 16

## 2020-03-12 DIAGNOSIS — Z5112 Encounter for antineoplastic immunotherapy: Secondary | ICD-10-CM | POA: Insufficient documentation

## 2020-03-12 DIAGNOSIS — C9000 Multiple myeloma not having achieved remission: Secondary | ICD-10-CM | POA: Diagnosis not present

## 2020-03-12 DIAGNOSIS — Z79899 Other long term (current) drug therapy: Secondary | ICD-10-CM | POA: Insufficient documentation

## 2020-03-12 LAB — CBC WITH DIFFERENTIAL/PLATELET
Abs Immature Granulocytes: 0.12 10*3/uL — ABNORMAL HIGH (ref 0.00–0.07)
Basophils Absolute: 0.1 10*3/uL (ref 0.0–0.1)
Basophils Relative: 1 %
Eosinophils Absolute: 0.7 10*3/uL — ABNORMAL HIGH (ref 0.0–0.5)
Eosinophils Relative: 7 %
HCT: 34.4 % — ABNORMAL LOW (ref 36.0–46.0)
Hemoglobin: 10.6 g/dL — ABNORMAL LOW (ref 12.0–15.0)
Immature Granulocytes: 1 %
Lymphocytes Relative: 27 %
Lymphs Abs: 2.8 10*3/uL (ref 0.7–4.0)
MCH: 28.7 pg (ref 26.0–34.0)
MCHC: 30.8 g/dL (ref 30.0–36.0)
MCV: 93.2 fL (ref 80.0–100.0)
Monocytes Absolute: 0.9 10*3/uL (ref 0.1–1.0)
Monocytes Relative: 9 %
Neutro Abs: 5.8 10*3/uL (ref 1.7–7.7)
Neutrophils Relative %: 55 %
Platelets: 207 10*3/uL (ref 150–400)
RBC: 3.69 MIL/uL — ABNORMAL LOW (ref 3.87–5.11)
RDW: 19.6 % — ABNORMAL HIGH (ref 11.5–15.5)
WBC: 10.4 10*3/uL (ref 4.0–10.5)
nRBC: 0.2 % (ref 0.0–0.2)

## 2020-03-12 LAB — COMPREHENSIVE METABOLIC PANEL
ALT: 12 U/L (ref 0–44)
AST: 16 U/L (ref 15–41)
Albumin: 3.6 g/dL (ref 3.5–5.0)
Alkaline Phosphatase: 71 U/L (ref 38–126)
Anion gap: 11 (ref 5–15)
BUN: 15 mg/dL (ref 8–23)
CO2: 25 mmol/L (ref 22–32)
Calcium: 8.9 mg/dL (ref 8.9–10.3)
Chloride: 100 mmol/L (ref 98–111)
Creatinine, Ser: 0.93 mg/dL (ref 0.44–1.00)
GFR, Estimated: >60 mL/min (ref >60)
Glucose, Bld: 273 mg/dL — ABNORMAL HIGH (ref 70–99)
Potassium: 3.3 mmol/L — ABNORMAL LOW (ref 3.5–5.1)
Sodium: 136 mmol/L (ref 135–145)
Total Bilirubin: 1.1 mg/dL (ref 0.3–1.2)
Total Protein: 6.5 g/dL (ref 6.5–8.1)

## 2020-03-12 LAB — LACTATE DEHYDROGENASE: LDH: 171 U/L (ref 98–192)

## 2020-03-12 MED ORDER — DEXAMETHASONE 4 MG PO TABS
20.0000 mg | ORAL_TABLET | Freq: Once | ORAL | Status: AC
  Administered 2020-03-12: 20 mg via ORAL
  Filled 2020-03-12: qty 5

## 2020-03-12 MED ORDER — BORTEZOMIB CHEMO SQ INJECTION 3.5 MG (2.5MG/ML)
1.3000 mg/m2 | Freq: Once | INTRAMUSCULAR | Status: AC
  Administered 2020-03-12: 2.25 mg via SUBCUTANEOUS
  Filled 2020-03-12: qty 0.9

## 2020-03-12 NOTE — Patient Instructions (Signed)
[facility] Discharge Instructions for Patients Receiving Chemotherapy   Beginning January 23rd 2017 lab work for the Cancer Center will be done in the  Main lab at [place] on 1st floor. If you have a lab appointment with the Cancer Center please come in thru the  Main Entrance and check in at the main information desk   Today you received the following chemotherapy agents Velcade injection. Follow-up as scheduled  To help prevent nausea and vomiting after your treatment, we encourage you to take your nausea medication   If you develop nausea and vomiting, or diarrhea that is not controlled by your medication, call the clinic.  The clinic phone number is [phone]. Office hours are Monday-Friday 8:30am-5:00pm.  BELOW ARE SYMPTOMS THAT SHOULD BE REPORTED IMMEDIATELY:  *FEVER GREATER THAN 101.0 F  *CHILLS WITH OR WITHOUT FEVER  NAUSEA AND VOMITING THAT IS NOT CONTROLLED WITH YOUR NAUSEA MEDICATION  *UNUSUAL SHORTNESS OF BREATH  *UNUSUAL BRUISING OR BLEEDING  TENDERNESS IN MOUTH AND THROAT WITH OR WITHOUT PRESENCE OF ULCERS  *URINARY PROBLEMS  *BOWEL PROBLEMS  UNUSUAL RASH Items with * indicate a potential emergency and should be followed up as soon as possible. If you have an emergency after office hours please contact your primary care physician or go to the nearest emergency department.  Please call the clinic during office hours if you have any questions or concerns.   You may also contact the Patient Navigator at [phone] should you have any questions or need assistance in obtaining follow up care.      Resources For Cancer Patients and their Caregivers ? American Cancer Society: Can assist with transportation, wigs, general needs, runs Look Good Feel Better.        1-888-227-6333 ? Cancer Care: Provides financial assistance, online support groups, medication/co-pay assistance.  1-800-813-HOPE (4673) ? [facility] Assists [place] cancer patients and their families through emotional , educational and financial support.  336-427-4357 ? [place] DSS Where to apply for food stamps, Medicaid and utility assistance. 336-342-1394 ? [facility]: Transportation to medical appointments. 336-347-2287 ? Social Security Administration: May apply for disability if have a Stage IV cancer. 336-342-7796 1-800-772-1213 ? [place] Aging, Disability and Transit Services: Assists with nutrition, care and transit needs. 336-349-2343         

## 2020-03-12 NOTE — Progress Notes (Signed)
Kim Mills tolerated Velcade injection well without complaints or incident. Labs reviewed prior to administering this medication. VSS Pt discharged via wheelchair in satisfactory condition accompanied by her son

## 2020-03-13 LAB — KAPPA/LAMBDA LIGHT CHAINS
Kappa free light chain: 32.8 mg/L — ABNORMAL HIGH (ref 3.3–19.4)
Kappa, lambda light chain ratio: 1.48 (ref 0.26–1.65)
Lambda free light chains: 22.1 mg/L (ref 5.7–26.3)

## 2020-03-14 LAB — PROTEIN ELECTROPHORESIS, SERUM
A/G Ratio: 1.6 (ref 0.7–1.7)
Albumin ELP: 3.8 g/dL (ref 2.9–4.4)
Alpha-1-Globulin: 0.2 g/dL (ref 0.0–0.4)
Alpha-2-Globulin: 0.7 g/dL (ref 0.4–1.0)
Beta Globulin: 1 g/dL (ref 0.7–1.3)
Gamma Globulin: 0.6 g/dL (ref 0.4–1.8)
Globulin, Total: 2.4 g/dL (ref 2.2–3.9)
Total Protein ELP: 6.2 g/dL (ref 6.0–8.5)

## 2020-03-14 LAB — IMMUNOFIXATION ELECTROPHORESIS
IgA: 263 mg/dL (ref 64–422)
IgG (Immunoglobin G), Serum: 692 mg/dL (ref 586–1602)
IgM (Immunoglobulin M), Srm: 19 mg/dL — ABNORMAL LOW (ref 26–217)
Total Protein ELP: 6.2 g/dL (ref 6.0–8.5)

## 2020-03-16 ENCOUNTER — Encounter: Payer: Self-pay | Admitting: Intensive Care

## 2020-03-16 ENCOUNTER — Emergency Department
Admission: EM | Admit: 2020-03-16 | Discharge: 2020-03-16 | Disposition: A | Discharge status: Home / Self Care | Payer: Medicare Other | Attending: Emergency Medicine | Admitting: Emergency Medicine

## 2020-03-16 ENCOUNTER — Other Ambulatory Visit: Payer: Self-pay

## 2020-03-16 DIAGNOSIS — R42 Dizziness and giddiness: Secondary | ICD-10-CM | POA: Diagnosis not present

## 2020-03-16 DIAGNOSIS — I2510 Atherosclerotic heart disease of native coronary artery without angina pectoris: Secondary | ICD-10-CM | POA: Diagnosis not present

## 2020-03-16 DIAGNOSIS — E039 Hypothyroidism, unspecified: Secondary | ICD-10-CM | POA: Insufficient documentation

## 2020-03-16 DIAGNOSIS — N183 Chronic kidney disease, stage 3 unspecified: Secondary | ICD-10-CM | POA: Insufficient documentation

## 2020-03-16 DIAGNOSIS — Z79899 Other long term (current) drug therapy: Secondary | ICD-10-CM | POA: Diagnosis not present

## 2020-03-16 DIAGNOSIS — R55 Syncope and collapse: Secondary | ICD-10-CM | POA: Diagnosis not present

## 2020-03-16 DIAGNOSIS — K5641 Fecal impaction: Secondary | ICD-10-CM | POA: Diagnosis not present

## 2020-03-16 DIAGNOSIS — K5900 Constipation, unspecified: Secondary | ICD-10-CM | POA: Diagnosis present

## 2020-03-16 DIAGNOSIS — R0902 Hypoxemia: Secondary | ICD-10-CM | POA: Diagnosis not present

## 2020-03-16 DIAGNOSIS — E1122 Type 2 diabetes mellitus with diabetic chronic kidney disease: Secondary | ICD-10-CM | POA: Insufficient documentation

## 2020-03-16 DIAGNOSIS — R000 Tachycardia, unspecified: Secondary | ICD-10-CM | POA: Diagnosis not present

## 2020-03-16 DIAGNOSIS — Z7722 Contact with and (suspected) exposure to environmental tobacco smoke (acute) (chronic): Secondary | ICD-10-CM | POA: Diagnosis not present

## 2020-03-16 DIAGNOSIS — Z7982 Long term (current) use of aspirin: Secondary | ICD-10-CM | POA: Insufficient documentation

## 2020-03-16 DIAGNOSIS — E1165 Type 2 diabetes mellitus with hyperglycemia: Secondary | ICD-10-CM | POA: Diagnosis not present

## 2020-03-16 DIAGNOSIS — E1140 Type 2 diabetes mellitus with diabetic neuropathy, unspecified: Secondary | ICD-10-CM | POA: Diagnosis not present

## 2020-03-16 DIAGNOSIS — Z7984 Long term (current) use of oral hypoglycemic drugs: Secondary | ICD-10-CM | POA: Insufficient documentation

## 2020-03-16 DIAGNOSIS — I129 Hypertensive chronic kidney disease with stage 1 through stage 4 chronic kidney disease, or unspecified chronic kidney disease: Secondary | ICD-10-CM | POA: Insufficient documentation

## 2020-03-16 HISTORY — DX: Malignant (primary) neoplasm, unspecified: C80.1

## 2020-03-16 NOTE — ED Triage Notes (Signed)
Patient reports being constipated X1 week. C/o pain in rectum. Reports trying many OTC meds with no results

## 2020-03-16 NOTE — ED Notes (Signed)
This RN  And Caryl Pina, RN over to check on pt, pt states that she is uncomfortable. RN offered to move pt to a lobby chair so that she would be more comfortable. Pt declined stating that moving made her more uncomfortable. Pt asked if she could be transferred to [facility]. RN informed her that we were not able to transfer her to [facility] but if her family wanted to take her by POV they could. Pt asked that we call her daughter. This RN called and Spoke with pt daughter, Iris, RN informed Iris that patient was wanting to leave and have her family take her to [facility]. Iris stated that she was not taking the patient to [facility] because she knew she would have an even longer wait. Iris asked this RN to left the patient know that she needed to stay here and wait to be seen. This RN spoke with pt and let her know that daughter wanted her to stay here and be seen. Pt is agreeable. RN informed pt that if she wants to try to move into a different chair to let us know.

## 2020-03-16 NOTE — ED Provider Notes (Signed)
[facility] Emergency Department Provider Note   ____________________________________________    I have reviewed the triage vital signs and the nursing notes.   HISTORY  Chief Complaint Constipation     HPI Kim Mills is a 80 y.o. female who presents with complaints of constipation.  Patient reports she has not had a bowel movement in nearly a week.  She has been taking over-the-counter laxatives with little success.  She does have some liquid stool but notes that she can feel pressure and firm stool in her rectum but it will not come out.  No abdominal pain, no nausea or vomiting  Past Medical History:  Diagnosis Date  . Arthritis   . Cancer ([place])   . Cataract   . Chest pain    Associated with weakness and fatigue  . Diabetes mellitus    A1c of 7.4 in 08/2010  . GERD (gastroesophageal reflux disease)   . Glaucoma   . Hyperlipidemia    Lipid profile in 08/2010:190, 121, 48, 118; normal CBC and CMet  . Hypertension    Lipid profile in 08/2010:190, 121, 48, 118; normal CBC and CMet  . Hypothyroidism   . Hypothyroidism   . Obesity     Patient Active Problem List   Diagnosis Date Noted  . Multiple myeloma ([place]) 10/13/2019  . Goals of care, counseling/discussion 10/13/2019  . Multiple myeloma not having achieved remission ([place]) 10/13/2019  . Monoclonal gammopathy 09/12/2019  . Gross hematuria 07/02/2019  . CKD (chronic kidney disease) stage 3, GFR 30-59 ml/min (HCC) 04/18/2019  . Vitamin D deficiency 04/17/2019  . B12 deficiency 04/12/2018  . Normocytic anemia 04/12/2018  . Carotid artery disease without cerebral infarction ([place]) 04/12/2018  . Gait disorder 01/11/2018  . Diabetic neuropathy ([place]) 01/10/2018  . Bilateral carotid bruits 11/28/2017  . CAD (coronary artery disease) 11/23/2017  . Elevated troponin   . SVT (supraventricular tachycardia) ([place]) 10/05/2017  . History of colonic polyps   . Encounter for screening colonoscopy  03/13/2014  . Constipation 03/13/2014  . GERD (gastroesophageal reflux disease) 03/13/2014  . Hyperlipidemia   . Type 2 diabetes mellitus ([place])   . OA (osteoarthritis) of knee   . Hypothyroidism   . Chest pain   . Obesity   . Hypertension     Past Surgical History:  Procedure Laterality Date  . CHOLECYSTECTOMY    . COLONOSCOPY N/A 04/01/2014   RMR: Melanosis coli. colonic polyps removed as described above.   . CYSTOSCOPY W/ URETERAL STENT PLACEMENT Right 09/13/2019   Procedure: CYSTOSCOPY WITH RIGHT  RETROGRADE PYELOGRAM; BLADDER BIOPSY;  Surgeon: Cleon Gustin, MD;  Location: [facility];  Service: Urology;  Laterality: Right;  . LEFT HEART CATH AND CORONARY ANGIOGRAPHY N/A 10/07/2017   Procedure: LEFT HEART CATH AND CORONARY ANGIOGRAPHY;  Surgeon: Troy Sine, MD;  Location: [place] [facility];  Service: Cardiovascular;  Laterality: N/A;  . THYROIDECTOMY, PARTIAL      Prior to Admission medications   Medication Sig Start Date End Date Taking? Authorizing Provider  acyclovir (ZOVIRAX) 400 MG tablet Take 1 tablet (400 mg total) by mouth 2 (two) times daily. 10/26/19   Derek Jack, MD  amLODipine (NORVASC) 10 MG tablet Take 1 tablet (10 mg total) by mouth daily. 04/20/19   Alycia Rossetti, MD  ASPIRIN 81 PO Take 1 tablet by mouth daily.    [provider]  benzonatate (TESSALON PERLES) 100 MG capsule Take 1 capsule (100 mg total) by mouth  2 (two) times daily as needed for cough. 12/18/19   Alycia Rossetti, MD  Blood Glucose Monitoring Suppl (BLOOD GLUCOSE SYSTEM PAK) KIT Please dispense based on patient and insurance preference. Use as directed to monitor FSBS 2x daily. Dx: E11.9. 04/12/18   Alycia Rossetti, MD  ciprofloxacin (CIPRO) 500 MG tablet Take 1 tablet (500 mg total) by mouth 2 (two) times daily. 02/11/20   Derek Jack, MD  fluticasone (FLONASE) 50 MCG/ACT nasal spray Place 2 sprays into both nostrils daily. 12/18/19   [place], Modena Nunnery, MD   gabapentin (NEURONTIN) 300 MG capsule Take 1 capsule (300 mg total) by mouth at bedtime. 04/24/19   [place], Modena Nunnery, MD  glipiZIDE (GLUCOTROL) 5 MG tablet TAKE ONE TABLET (5MG TOTAL) BY MOUTH TWOTIMES DAILY 03/10/20   Alycia Rossetti, MD  Glucose Blood (BLOOD GLUCOSE TEST STRIPS) STRP Please dispense based on patient and insurance preference. Use as directed to monitor FSBS 2x daily. Dx: E11.9. 04/12/18   Alycia Rossetti, MD  HM LORATADINE 10 MG tablet TAKE ONE TABLET (10MG TOTAL) BY MOUTH DAILY 10/05/19   [place], Modena Nunnery, MD  hydrochlorothiazide (MICROZIDE) 12.5 MG capsule TAKE ONE CAPSULE BY MOUTH DAILY Patient taking differently: Take 12.5 mg by mouth daily. 06/28/19   Alycia Rossetti, MD  HYDROcodone-acetaminophen (NORCO/VICODIN) 5-325 MG tablet Take 1 tablet by mouth every 6 (six) hours as needed for moderate pain. 12/11/19   Derek Jack, MD  Lancets MISC Please dispense based on patient and insurance preference. Use as directed to monitor FSBS 2x daily. Dx: E11.9. 04/12/18   Alycia Rossetti, MD  lenalidomide (REVLIMID) 15 MG capsule Take 1 capsule (15 mg total) by mouth daily. 03/10/20   Derek Jack, MD  lidocaine (XYLOCAINE) 2 % solution  01/09/20   [provider]  magic mouthwash w/lidocaine SOLN Take 5 mLs by mouth 4 (four) times daily. 01/08/20   Derek Jack, MD  magnesium oxide (MAG-OX) 400 (241.3 Mg) MG tablet Take 1 tablet (400 mg total) by mouth 2 (two) times daily. 03/05/20   Derek Jack, MD  Magnesium Oxide 400 (240 Mg) MG TABS Take 1 tablet by mouth 2 (two) times daily. 03/05/20   [provider]  meclizine (ANTIVERT) 25 MG tablet Take 25 mg by mouth every 6 (six) hours as needed for dizziness.     [provider]  metFORMIN (GLUCOPHAGE) 1000 MG tablet TAKE ONE TABLET BY MOUTH TWICE A DAY 10/05/19   [place], Modena Nunnery, MD  metoprolol tartrate (LOPRESSOR) 25 MG tablet Take 0.5 tablets (12.5 mg total) by mouth 2 (two) times  daily. 04/23/19   [place], Modena Nunnery, MD  omeprazole (PRILOSEC) 40 MG capsule TAKE ONE CAPSULE BY MOUTH DAILY Patient taking differently: Take 40 mg by mouth daily. 05/31/19   , Modena Nunnery, MD  potassium chloride SA (KLOR-CON) 20 MEQ tablet Take 1 tablet (20 mEq total) by mouth 2 (two) times daily. 12/28/19   Jacquelin Hawking, NP  prochlorperazine (COMPAZINE) 10 MG tablet Take 1 tablet (10 mg total) by mouth every 6 (six) hours as needed (Nausea or vomiting). 10/26/19   Derek Jack, MD  simvastatin (ZOCOR) 40 MG tablet TAKE ONE TABLET (40MG TOTAL) BY MOUTH BEDTIME 03/10/20   , Modena Nunnery, MD  SYNTHROID 137 MCG tablet Take 137 mcg by mouth every morning. 12/04/19   [provider]  tolterodine (DETROL LA) 4 MG 24 hr capsule TAKE ONE CAPSULE BY MOUTH DAILY Patient taking differently: Take 4 mg by  mouth daily. 04/24/19   [place], Velna Hatchet, MD  traMADol (ULTRAM) 50 MG tablet Take 1 tablet (50 mg total) by mouth 2 (two) times daily as needed. 02/11/20 02/10/21  Doreatha Massed, MD     Allergies Patient has no known allergies.  Family History  Problem Relation Age of Onset  . Anemia Father   . Arthritis Mother   . Stroke Brother   . Cancer Sister        unknown kind  . Diabetes Sister   . Dementia Sister   . Cancer Niece        Breast  . Cancer Brother   . Healthy Son   . Healthy Son   . Healthy Son   . Healthy Daughter   . Healthy Daughter   . Healthy Daughter   . Sarcoidosis Daughter     Social History Social History   Tobacco Use  . Smoking status: Passive Smoke Exposure - Never Smoker  . Smokeless tobacco: Never Used  Vaping Use  . Vaping Use: Never used  Substance Use Topics  . Alcohol use: Never  . Drug use: Never    Review of Systems  Constitutional: No fever/chills  Cardiovascular: Denies chest pain. Respiratory: Denies shortness of breath. Gastrointestinal: As above  Musculoskeletal: Negative for back pain. Skin: Negative for  rash. Neurological: Negative for headaches    ____________________________________________   PHYSICAL EXAM:  VITAL SIGNS: ED Triage Vitals  Enc Vitals Group     BP 03/16/20 1757 (!) 154/76     Pulse Rate 03/16/20 1757 76     Resp 03/16/20 1757 14     Temp 03/16/20 1757 97.8 F (36.6 C)     Temp Source 03/16/20 1757 Oral     SpO2 03/16/20 1757 100 %     Weight 03/16/20 1758 75.8 kg (167 lb)     Height 03/16/20 1758 1.575 m (5\' 2" )     Head Circumference --      Peak Flow --      Pain Score 03/16/20 1758 10     Pain Loc --      Pain Edu? --      Excl. in GC? --     Constitutional: Alert and oriented. No acute distress. Pleasant and interactive  Nose: No congestion/rhinnorhea. Mouth/Throat: Mucous membranes are moist.    Cardiovascular: Normal rate, regular rhythm. Grossly normal heart sounds.  Good peripheral circulation. Respiratory: Normal respiratory effort.  No retractions. Lungs CTAB. Gastrointestinal: Soft and nontender. No distention.  No CVA tenderness.  Reassuring exam  Musculoskeletal:   Warm and well perfused Neurologic:  Normal speech and language. No gross focal neurologic deficits are appreciated.  Skin:  Skin is warm, dry and intact. No rash noted. Psychiatric: Mood and affect are normal. Speech and behavior are normal.  ____________________________________________   LABS (all labs ordered are listed, but only abnormal results are displayed)  Labs Reviewed - No data to display ____________________________________________  EKG  None ____________________________________________  RADIOLOGY  None ____________________________________________   PROCEDURES  Procedure(s) performed: yes   ------------------------------------------------------------------------------------------------------------------- Fecal Disimpaction Procedure Note:  Performed by me:  Patient placed in the lateral recumbent position with knees drawn towards chest. Nurse  present for patient support. Large amount of hard brown stool removed. No complications during procedure.   ------------------------------------------------------------------------------------------------------------------    Procedures   Critical Care performed: No ____________________________________________   INITIAL IMPRESSION / ASSESSMENT AND PLAN / ED COURSE  Pertinent labs & imaging results that were available during my  care of the patient were reviewed by me and considered in my medical decision making (see chart for details).  Patient presents with constipation, feeling of large amount of stool in the rectum, unable to have a BM.  No abdominal pain, reassuring exam.  On rectal exam large amount of hard brown stool in the rectal vault, this was removed during fecal disimpaction, patient able to have a bowel movement immediately afterward and feeling much improved.  Recommend continued laxative use.  No negation for further work-up at this time   ____________________________________________   FINAL CLINICAL IMPRESSION(S) / ED DIAGNOSES  Final diagnoses:  Fecal impaction in rectum [facility])        Note:  This document was prepared using Dragon voice recognition software and may include unintentional dictation errors.   Lavonia Drafts, MD 03/16/20 2027

## 2020-03-16 NOTE — ED Notes (Signed)
First Nurse Note: Pt to ED via [facility] for constipation x 1 week. Has tried OTC meds without relief. Pt dtr tried to disimpact her today but was not able to get stool out. Pt is in NAD. Pt has 22 G in left hand. CBG 259.

## 2020-03-16 NOTE — Discharge Instructions (Addendum)
Patient had large amount of stool removed from rectum. Please continue taking laxatives, stooling should improve significantly over the next 12-24 hours

## 2020-03-17 ENCOUNTER — Telehealth: Payer: Self-pay

## 2020-03-17 NOTE — Telephone Encounter (Signed)
Transition Care Management Unsuccessful Follow-up Telephone Call  Date of discharge and from where:  03/16/2020 from [facility]  Attempts:  1st Attempt  Reason for unsuccessful TCM follow-up call:  Left voice message

## 2020-03-18 ENCOUNTER — Ambulatory Visit: Payer: Medicare Other | Admitting: Nurse Practitioner

## 2020-03-18 NOTE — Telephone Encounter (Signed)
Transition Care Management Follow-up Telephone Call  Date of discharge and from where: 03/16/2020 from [facility]  How have you been since you were released from the hospital? Pt daughter stated that patient is resting but was with her at the ED visit. Dtr stated that patient has not had much bowel movement since she has been home. Encouraged follow up with PCP office to determine if increasing the number of times patient takes Miralax per day.   Any questions or concerns? No  Items Reviewed:  Did the pt receive and understand the discharge instructions provided? Yes   Medications obtained and verified? Yes   Other? No   Any new allergies since your discharge? No   Dietary orders reviewed? N/A  Do you have support at home? Yes   Functional Questionnaire: (I = Independent and D = Dependent) ADLs: I  Bathing/Dressing- I  Meal Prep- I  Eating- I  Maintaining continence- I  Transferring/Ambulation- I  Managing Meds- I   Follow up appointments reviewed:   PCP Hospital f/u appt confirmed? No  Pt daughter to call provider office for an Hospital Follow.   Are transportation arrangements needed? No   If their condition worsens, is the pt aware to call PCP or go to the Emergency Dept.? Yes  Was the patient provided with contact information for the PCP's office or ED? Yes  Was to pt encouraged to call back with questions or concerns? Yes

## 2020-03-19 ENCOUNTER — Other Ambulatory Visit (HOSPITAL_COMMUNITY): Payer: Medicare Other

## 2020-03-19 ENCOUNTER — Ambulatory Visit (HOSPITAL_COMMUNITY): Payer: Medicare Other

## 2020-03-19 ENCOUNTER — Ambulatory Visit (HOSPITAL_COMMUNITY): Payer: Medicare Other | Admitting: Hematology

## 2020-03-19 ENCOUNTER — Other Ambulatory Visit: Payer: Self-pay

## 2020-03-19 ENCOUNTER — Ambulatory Visit (INDEPENDENT_AMBULATORY_CARE_PROVIDER_SITE_OTHER): Payer: Medicare Other | Admitting: Nurse Practitioner

## 2020-03-19 ENCOUNTER — Inpatient Hospital Stay (HOSPITAL_COMMUNITY): Payer: Medicare Other

## 2020-03-19 VITALS — BP 144/74 | HR 72 | Temp 97.2°F | Wt 168.0 lb

## 2020-03-19 DIAGNOSIS — R404 Transient alteration of awareness: Secondary | ICD-10-CM

## 2020-03-19 DIAGNOSIS — R55 Syncope and collapse: Secondary | ICD-10-CM

## 2020-03-19 DIAGNOSIS — R0989 Other specified symptoms and signs involving the circulatory and respiratory systems: Secondary | ICD-10-CM | POA: Diagnosis not present

## 2020-03-19 LAB — BASIC METABOLIC PANEL
BUN: 11 mg/dL (ref 7–25)
CO2: 28 mmol/L (ref 20–32)
Calcium: 8.7 mg/dL (ref 8.6–10.4)
Chloride: 107 mmol/L (ref 98–110)
Creat: 0.88 mg/dL (ref 0.60–0.93)
Glucose, Bld: 207 mg/dL — ABNORMAL HIGH (ref 65–99)
Potassium: 3.9 mmol/L (ref 3.5–5.3)
Sodium: 142 mmol/L (ref 135–146)

## 2020-03-19 LAB — CBC WITH DIFFERENTIAL/PLATELET
Absolute Monocytes: 989 cells/uL — ABNORMAL HIGH (ref 200–950)
Basophils Absolute: 62 cells/uL (ref 0–200)
Basophils Relative: 0.6 %
Eosinophils Absolute: 680 cells/uL — ABNORMAL HIGH (ref 15–500)
Eosinophils Relative: 6.6 %
HCT: 31.7 % — ABNORMAL LOW (ref 35.0–45.0)
Hemoglobin: 10.2 g/dL — ABNORMAL LOW (ref 11.7–15.5)
Lymphs Abs: 2781 cells/uL (ref 850–3900)
MCH: 29.2 pg (ref 27.0–33.0)
MCHC: 32.2 g/dL (ref 32.0–36.0)
MCV: 90.8 fL (ref 80.0–100.0)
MPV: 12.7 fL — ABNORMAL HIGH (ref 7.5–12.5)
Monocytes Relative: 9.6 %
Neutro Abs: 5789 cells/uL (ref 1500–7800)
Neutrophils Relative %: 56.2 %
Platelets: 170 10*3/uL (ref 140–400)
RBC: 3.49 10*6/uL — ABNORMAL LOW (ref 3.80–5.10)
RDW: 18.4 % — ABNORMAL HIGH (ref 11.0–15.0)
Total Lymphocyte: 27 %
WBC: 10.3 10*3/uL (ref 3.8–10.8)

## 2020-03-19 NOTE — Progress Notes (Signed)
Subjective:    Patient ID: Kim Mills, female    DOB: 12-Oct-1940, 80 y.o.   MRN: [id number]  HPI: Kim Mills is a 80 y.o. female presenting for hospital follow up.  Chief Complaint  Patient presents with  . Hospitalization Follow-up    Has had two episodes of being unresponsive with fainting. This is a new occurrence. Once while getting hair washed and once while trying to have bm. Last occurrence this past Sunday. Pt is using the restrm regularly now. Ems called for 2nd faint episode   ER FOLLOW UP Time since discharge: 2 days Hospital/facility: [facility] Diagnosis: fecal impaction in rectum Procedures/tests: fecal disimpaction Consultants: none New medications: Miralax Discharge instructions:  Continued laxative use Status: better/resolved  DIZZINESS Daughter reports 2 incidents of loss of consciousness.  Last one was Sunday when trying to make a BM.   Duration: weeks Description of symptoms: passed out Duration of episode: minutes Dizziness frequency: weekly Aggravating/provoking factors:   Triggered by rolling over in bed: no Triggered by bending over: no Aggravated by head movement: no Aggravated by exertion: no Aggravated by coughing: no Aggravated by loud noises: no Recent head injury: no Recent or current viral symptoms: yes; some congestion History of vasovagal episodes: no Nausea: yes Vomiting: no Tinnitus: no Hearing loss: yes Aural fullness: no Headache: no Photophobia: no Phonophobia: no Unsteady gait: yes Postural instability: no Diplopia: does not remember Dysarthria: yes Dysphagia: yes Weakness: yes Related to exertion: no Pallor: yes Diaphoresis: yes Dyspnea: no Chest pain: no  No Known Allergies  Outpatient Encounter Medications as of 03/19/2020  Medication Sig  . acyclovir (ZOVIRAX) 400 MG tablet Take 1 tablet (400 mg total) by mouth 2 (two) times daily.  Marland Kitchen amLODipine (NORVASC) 10 MG tablet Take 1 tablet (10 mg total) by  mouth daily.  . ASPIRIN 81 PO Take 1 tablet by mouth daily.  . Blood Glucose Monitoring Suppl (BLOOD GLUCOSE SYSTEM PAK) KIT Please dispense based on patient and insurance preference. Use as directed to monitor FSBS 2x daily. Dx: E11.9.  Marland Kitchen ciprofloxacin (CIPRO) 500 MG tablet Take 1 tablet (500 mg total) by mouth 2 (two) times daily.  . fluticasone (FLONASE) 50 MCG/ACT nasal spray Place 2 sprays into both nostrils daily.  Marland Kitchen gabapentin (NEURONTIN) 300 MG capsule Take 1 capsule (300 mg total) by mouth at bedtime.  Marland Kitchen glipiZIDE (GLUCOTROL) 5 MG tablet TAKE ONE TABLET (5MG TOTAL) BY MOUTH TWOTIMES DAILY  . Glucose Blood (BLOOD GLUCOSE TEST STRIPS) STRP Please dispense based on patient and insurance preference. Use as directed to monitor FSBS 2x daily. Dx: E11.9.  . HM LORATADINE 10 MG tablet TAKE ONE TABLET (10MG TOTAL) BY MOUTH DAILY  . hydrochlorothiazide (MICROZIDE) 12.5 MG capsule TAKE ONE CAPSULE BY MOUTH DAILY (Patient taking differently: Take 12.5 mg by mouth daily.)  . HYDROcodone-acetaminophen (NORCO/VICODIN) 5-325 MG tablet Take 1 tablet by mouth every 6 (six) hours as needed for moderate pain.  . Lancets MISC Please dispense based on patient and insurance preference. Use as directed to monitor FSBS 2x daily. Dx: E11.9.  . lenalidomide (REVLIMID) 15 MG capsule Take 1 capsule (15 mg total) by mouth daily.  Marland Kitchen lidocaine (XYLOCAINE) 2 % solution   . magic mouthwash w/lidocaine SOLN Take 5 mLs by mouth 4 (four) times daily.  . magnesium oxide (MAG-OX) 400 (241.3 Mg) MG tablet Take 1 tablet (400 mg total) by mouth 2 (two) times daily.  . Magnesium Oxide 400 (240 Mg) MG TABS Take 1  tablet by mouth 2 (two) times daily.  . meclizine (ANTIVERT) 25 MG tablet Take 25 mg by mouth every 6 (six) hours as needed for dizziness.   . metFORMIN (GLUCOPHAGE) 1000 MG tablet TAKE ONE TABLET BY MOUTH TWICE A DAY  . metoprolol tartrate (LOPRESSOR) 25 MG tablet Take 0.5 tablets (12.5 mg total) by mouth 2 (two) times  daily.  Marland Kitchen omeprazole (PRILOSEC) 40 MG capsule TAKE ONE CAPSULE BY MOUTH DAILY (Patient taking differently: Take 40 mg by mouth daily.)  . potassium chloride SA (KLOR-CON) 20 MEQ tablet Take 1 tablet (20 mEq total) by mouth 2 (two) times daily.  . prochlorperazine (COMPAZINE) 10 MG tablet Take 1 tablet (10 mg total) by mouth every 6 (six) hours as needed (Nausea or vomiting).  . simvastatin (ZOCOR) 40 MG tablet TAKE ONE TABLET (40MG TOTAL) BY MOUTH BEDTIME  . SYNTHROID 137 MCG tablet Take 137 mcg by mouth every morning.  . tolterodine (DETROL LA) 4 MG 24 hr capsule TAKE ONE CAPSULE BY MOUTH DAILY (Patient taking differently: Take 4 mg by mouth daily.)  . traMADol (ULTRAM) 50 MG tablet Take 1 tablet (50 mg total) by mouth 2 (two) times daily as needed.  . [DISCONTINUED] benzonatate (TESSALON PERLES) 100 MG capsule Take 1 capsule (100 mg total) by mouth 2 (two) times daily as needed for cough.   No facility-administered encounter medications on file as of 03/19/2020.    Patient Active Problem List   Diagnosis Date Noted  . Multiple myeloma ([place]) 10/13/2019  . Goals of care, counseling/discussion 10/13/2019  . Multiple myeloma not having achieved remission ([place]) 10/13/2019  . Monoclonal gammopathy 09/12/2019  . Gross hematuria 07/02/2019  . CKD (chronic kidney disease) stage 3, GFR 30-59 ml/min (HCC) 04/18/2019  . Vitamin D deficiency 04/17/2019  . B12 deficiency 04/12/2018  . Normocytic anemia 04/12/2018  . Carotid artery disease without cerebral infarction ([place]) 04/12/2018  . Gait disorder 01/11/2018  . Diabetic neuropathy ([place]) 01/10/2018  . Bilateral carotid bruits 11/28/2017  . CAD (coronary artery disease) 11/23/2017  . Elevated troponin   . SVT (supraventricular tachycardia) ([place]) 10/05/2017  . History of colonic polyps   . Encounter for screening colonoscopy 03/13/2014  . Constipation 03/13/2014  . GERD (gastroesophageal reflux disease) 03/13/2014  . Hyperlipidemia   . Type 2  diabetes mellitus ([place])   . OA (osteoarthritis) of knee   . Hypothyroidism   . Chest pain   . Obesity   . Hypertension     Past Medical History:  Diagnosis Date  . Arthritis   . Cancer ([place])   . Cataract   . Chest pain    Associated with weakness and fatigue  . Diabetes mellitus    A1c of 7.4 in 08/2010  . GERD (gastroesophageal reflux disease)   . Glaucoma   . Hyperlipidemia    Lipid profile in 08/2010:190, 121, 48, 118; normal CBC and CMet  . Hypertension    Lipid profile in 08/2010:190, 121, 48, 118; normal CBC and CMet  . Hypothyroidism   . Hypothyroidism   . Obesity     Relevant past medical, surgical, family and social history reviewed and updated as indicated. Interim medical history since our last visit reviewed.  Review of Systems Per HPI unless specifically indicated above     Objective:    BP (!) 144/74   Pulse 72   Temp (!) 97.2 F (36.2 C)   Wt 168 lb (76.2 kg)   SpO2 96%   BMI 30.73 kg/m  Wt Readings from Last 3 Encounters:  03/19/20 168 lb (76.2 kg)  03/16/20 167 lb (75.8 kg)  02/11/20 181 lb 3.2 oz (82.2 kg)    Physical Exam Vitals and nursing note reviewed.  Constitutional:      Appearance: Normal appearance. She is obese.     Comments: Sitting in wheelchair  HENT:     Head: Normocephalic and atraumatic.     Right Ear: External ear normal.     Left Ear: External ear normal.     Nose: Nose normal. No congestion.     Mouth/Throat:     Mouth: Mucous membranes are moist.     Pharynx: Oropharynx is clear.  Eyes:     General: No scleral icterus.    Extraocular Movements: Extraocular movements intact.     Pupils: Pupils are equal, round, and reactive to light.  Pulmonary:     Effort: Pulmonary effort is normal. No respiratory distress.     Breath sounds: Examination of the left-middle field reveals rhonchi. Examination of the left-lower field reveals rhonchi. Rhonchi present.  Abdominal:     General: Abdomen is flat. Bowel sounds are  normal. There is no distension.     Palpations: Abdomen is soft.     Tenderness: There is no abdominal tenderness.  Musculoskeletal:     Right foot: Swelling present.     Left foot: Swelling present.  Neurological:     Mental Status: She is alert. Mental status is at baseline.     Sensory: Sensation is intact.     Motor: Motor function is intact.     Coordination: Coordination is intact. Rapid alternating movements normal.     Comments: Unable to assess gait as patient is wheelchair bound at baseline        Assessment & Plan:  1. Syncope, unspecified syncope type Acute, ongoing.  Reports 2 episodes of syncope in past 2 weeks.  Differentials include vasovagal response, hypoglycemia, hypotension, ischemic changes, or arrhythmia.  EKG today in clinic negative and no neurological deficits.   High risk for CVA given age, diabetes, CAD.  Instructed to continue bowel regimen and do not strain when trying to have BM.  Daughter also reports memory has been worsening for past few months.  Will obtain CBC and BMP along with stat CT of head to rule out acute stroke.  EKG normal today in clinic.    - EKG 12-Lead  2. Chest congestion Acute, ongoing.  Daughter reports congestion and rhonchorus breath sounds heard on examination.  Will obtain chest x-ray to rule out fluid overload vs. Pneumonia.   - DG Chest 2 View; Future  3. Transient alteration of awareness Acute, ongoing.  Reports 2 episodes of syncope in past 2 weeks.  Differentials include vasovagal response, hypoglycemia, hypotension, ischemic changes, or arrhythmia.  EKG today in clinic negative and no neurological deficits.   High risk for CVA given age, diabetes, CAD.  Instructed to continue bowel regimen and do not strain when trying to have BM.  Daughter also reports memory has been worsening for past few months.  Will obtain CBC and BMP along with stat CT of head to rule out acute stroke.  EKG normal today in clinic.  If symptoms return,  advised family to call 911.  - Basic Metabolic Panel - CBC with Differential - CT Head Wo Contrast; Future    Follow up plan: Return if symptoms worsen or fail to improve.

## 2020-03-20 ENCOUNTER — Ambulatory Visit (HOSPITAL_COMMUNITY): Admission: RE | Admit: 2020-03-20 | Payer: Medicare Other | Source: Ambulatory Visit

## 2020-03-20 ENCOUNTER — Telehealth: Payer: Self-pay

## 2020-03-20 ENCOUNTER — Ambulatory Visit (HOSPITAL_COMMUNITY): Payer: Medicare Other

## 2020-03-20 ENCOUNTER — Inpatient Hospital Stay (HOSPITAL_COMMUNITY): Payer: Medicare Other

## 2020-03-20 NOTE — Telephone Encounter (Signed)
Spoke with daughter Corky Sing as to why stat CT has to be reshceduled. Per daughter, the pt has had diarrhea for the past couple of days based off of something she ate. Appt has been rescheduled for 03/26/20. NP is aware of the rescheduling, however really stresses that this procedure is done for rule out of stroke.

## 2020-03-24 ENCOUNTER — Ambulatory Visit (HOSPITAL_COMMUNITY): Payer: Medicare Other | Admitting: Hematology

## 2020-03-24 ENCOUNTER — Ambulatory Visit: Payer: Medicare Other | Admitting: Urology

## 2020-03-26 ENCOUNTER — Ambulatory Visit (HOSPITAL_COMMUNITY)
Admission: RE | Admit: 2020-03-26 | Discharge: 2020-03-26 | Disposition: A | Discharge status: Home / Self Care | Payer: Medicare Other | Source: Ambulatory Visit | Attending: Nurse Practitioner | Admitting: Nurse Practitioner

## 2020-03-26 ENCOUNTER — Inpatient Hospital Stay (HOSPITAL_COMMUNITY): Payer: Medicare Other

## 2020-03-26 ENCOUNTER — Other Ambulatory Visit: Payer: Self-pay

## 2020-03-26 ENCOUNTER — Inpatient Hospital Stay (HOSPITAL_BASED_OUTPATIENT_CLINIC_OR_DEPARTMENT_OTHER): Payer: Medicare Other | Admitting: Hematology

## 2020-03-26 ENCOUNTER — Telehealth: Payer: Self-pay

## 2020-03-26 VITALS — BP 159/57 | HR 67 | Temp 96.8°F | Resp 18 | Wt 183.8 lb

## 2020-03-26 DIAGNOSIS — C9000 Multiple myeloma not having achieved remission: Secondary | ICD-10-CM

## 2020-03-26 DIAGNOSIS — Z5112 Encounter for antineoplastic immunotherapy: Secondary | ICD-10-CM | POA: Diagnosis not present

## 2020-03-26 DIAGNOSIS — R0989 Other specified symptoms and signs involving the circulatory and respiratory systems: Secondary | ICD-10-CM

## 2020-03-26 DIAGNOSIS — R404 Transient alteration of awareness: Secondary | ICD-10-CM | POA: Insufficient documentation

## 2020-03-26 DIAGNOSIS — Z79899 Other long term (current) drug therapy: Secondary | ICD-10-CM | POA: Diagnosis not present

## 2020-03-26 DIAGNOSIS — C9001 Multiple myeloma in remission: Secondary | ICD-10-CM | POA: Diagnosis not present

## 2020-03-26 DIAGNOSIS — R4182 Altered mental status, unspecified: Secondary | ICD-10-CM | POA: Diagnosis not present

## 2020-03-26 LAB — CBC WITH DIFFERENTIAL/PLATELET
Abs Immature Granulocytes: 0.06 10*3/uL (ref 0.00–0.07)
Basophils Absolute: 0.1 10*3/uL (ref 0.0–0.1)
Basophils Relative: 1 %
Eosinophils Absolute: 0.6 10*3/uL — ABNORMAL HIGH (ref 0.0–0.5)
Eosinophils Relative: 8 %
HCT: 33.7 % — ABNORMAL LOW (ref 36.0–46.0)
Hemoglobin: 10.3 g/dL — ABNORMAL LOW (ref 12.0–15.0)
Immature Granulocytes: 1 %
Lymphocytes Relative: 28 %
Lymphs Abs: 2.3 10*3/uL (ref 0.7–4.0)
MCH: 29.1 pg (ref 26.0–34.0)
MCHC: 30.6 g/dL (ref 30.0–36.0)
MCV: 95.2 fL (ref 80.0–100.0)
Monocytes Absolute: 0.7 10*3/uL (ref 0.1–1.0)
Monocytes Relative: 9 %
Neutro Abs: 4.3 10*3/uL (ref 1.7–7.7)
Neutrophils Relative %: 53 %
Platelets: 265 10*3/uL (ref 150–400)
RBC: 3.54 MIL/uL — ABNORMAL LOW (ref 3.87–5.11)
RDW: 19.6 % — ABNORMAL HIGH (ref 11.5–15.5)
WBC: 8.1 10*3/uL (ref 4.0–10.5)
nRBC: 0.2 % (ref 0.0–0.2)

## 2020-03-26 LAB — COMPREHENSIVE METABOLIC PANEL
ALT: 12 U/L (ref 0–44)
AST: 13 U/L — ABNORMAL LOW (ref 15–41)
Albumin: 3.5 g/dL (ref 3.5–5.0)
Alkaline Phosphatase: 80 U/L (ref 38–126)
Anion gap: 6 (ref 5–15)
BUN: 11 mg/dL (ref 8–23)
CO2: 27 mmol/L (ref 22–32)
Calcium: 9.2 mg/dL (ref 8.9–10.3)
Chloride: 106 mmol/L (ref 98–111)
Creatinine, Ser: 0.83 mg/dL (ref 0.44–1.00)
GFR, Estimated: >60 mL/min (ref >60)
Glucose, Bld: 211 mg/dL — ABNORMAL HIGH (ref 70–99)
Potassium: 3.7 mmol/L (ref 3.5–5.1)
Sodium: 139 mmol/L (ref 135–145)
Total Bilirubin: 0.8 mg/dL (ref 0.3–1.2)
Total Protein: 6.4 g/dL — ABNORMAL LOW (ref 6.5–8.1)

## 2020-03-26 LAB — URINALYSIS, DIPSTICK ONLY
Bilirubin Urine: NEGATIVE
Glucose, UA: >=500 mg/dL — AB
Hgb urine dipstick: NEGATIVE
Ketones, ur: NEGATIVE mg/dL
Nitrite: NEGATIVE
Protein, ur: 30 mg/dL — AB
Specific Gravity, Urine: 1.015 (ref 1.005–1.030)
pH: 5 (ref 5.0–8.0)

## 2020-03-26 LAB — LACTATE DEHYDROGENASE: LDH: 170 U/L (ref 98–192)

## 2020-03-26 MED ORDER — DEXAMETHASONE 4 MG PO TABS
20.0000 mg | ORAL_TABLET | Freq: Once | ORAL | Status: AC
  Administered 2020-03-26: 20 mg via ORAL
  Filled 2020-03-26: qty 5

## 2020-03-26 MED ORDER — BORTEZOMIB CHEMO SQ INJECTION 3.5 MG (2.5MG/ML)
1.3000 mg/m2 | Freq: Once | INTRAMUSCULAR | Status: AC
  Administered 2020-03-26: 2.25 mg via SUBCUTANEOUS
  Filled 2020-03-26: qty 0.9

## 2020-03-26 MED ORDER — CIPROFLOXACIN HCL 500 MG PO TABS: 500.0000 mg | ORAL_TABLET | Freq: Two times a day (BID) | ORAL | 0 refills | Status: AC

## 2020-03-26 NOTE — Patient Instructions (Signed)
[place] at [facility] Discharge Instructions  You were seen today by Dr. Delton Coombes. He went over your recent results. You received your treatment today; continue getting your weekly treatment. You had a urinalysis and urine culture today to check for a urine infection. You will be prescribed Cipro 500 mg to take twice daily for 7 days. Continue applying a lotion and hydrocortisone cream to the back to prevent it from itching. Dr. Delton Coombes will see you back in 4 weeks for labs and follow up.   Thank you for choosing [place] at [facility] to provide your oncology and hematology care.  To afford each patient quality time with our provider, please arrive at least 15 minutes before your scheduled appointment time.   If you have a lab appointment with the [place] please come in thru the Main Entrance and check in at the main information desk  You need to re-schedule your appointment should you arrive 10 or more minutes late.  We strive to give you quality time with our providers, and arriving late affects you and other patients whose appointments are after yours.  Also, if you no show three or more times for appointments you may be dismissed from the clinic at the providers discretion.     Again, thank you for choosing [facility].  Our hope is that these requests will decrease the amount of time that you wait before being seen by our physicians.       _____________________________________________________________  Should you have questions after your visit to [facility], please contact our office at [phone] between the hours of 8:00 a.m. and 4:30 p.m.  Voicemails left after 4:00 p.m. will not be returned until the following business day.  For prescription refill requests, have your pharmacy contact our office and allow 72 hours.    Cancer Center Support Programs:   > Cancer Support Group  2nd Tuesday of the  month 1pm-2pm, Journey Room

## 2020-03-26 NOTE — Telephone Encounter (Signed)
Noted, see CT result

## 2020-03-26 NOTE — Progress Notes (Signed)
Patient was assessed by Dr. Delton Coombes and labs have been reviewed.  Patient is okay to proceed with treatment today. Dr. Raliegh Ip is ordering urinalysis and urine culture. Primary RN and pharmacy aware.

## 2020-03-26 NOTE — Progress Notes (Signed)
Kim [place], [place] [address]   CLINIC:  Medical Oncology/Hematology  PCP:  Kim Rossetti, MD [address] [place] [address] [phone]   REASON FOR VISIT:  Follow-up for multiple myeloma & normocytic anemia  PRIOR THERAPY: None  NGS Results: Not done  CURRENT THERAPY: Velcade weekly; Revlimid 2/3 weeks  BRIEF ONCOLOGIC HISTORY:  Oncology History  Multiple myeloma not having achieved remission ([place])  10/13/2019 Initial Diagnosis   Multiple myeloma not having achieved remission ([place])   10/29/2019 -  Chemotherapy    Patient is on Treatment Plan: MYELOMA NON-TRANSPLANT CANDIDATES VRD WEEKLY Q21D        CANCER STAGING: Cancer Staging No matching staging information was found for the patient.  INTERVAL HISTORY:  [name], a 80 y.o. female, returns for routine follow-up and consideration for next cycle of chemotherapy. [name] was last seen on 02/11/2020.  Due for cycle #8 of Velcade today.   Today she is accompanied by her daughter. Overall, she tells me she has been feeling okay. She had 2 episodes, once while bearing down on the commode and once while getting her hair washed. She again has foul smelling urine; her urine improved after her last course of Cipro. She is taking Revlimid 2 weeks on and 1 week off and tolerating it well; she started a new bottle on 02/13. She is also taking weekly Decadron. She continues scratching her left back even though her daughter is applying lotion onto it. She had diarrhea last week, but no more since then.   Overall, she feels ready for next cycle of chemo today.    REVIEW OF SYSTEMS:  Review of Systems  Constitutional: Positive for appetite change (50%) and fatigue (50%).  Gastrointestinal: Negative for constipation and diarrhea.  Genitourinary: Positive for dysuria (foul smelling urine).   Skin: Positive for itching (L back).  All other systems reviewed and are  negative.   PAST MEDICAL/SURGICAL HISTORY:  Past Medical History:  Diagnosis Date  . Arthritis   . Cancer ([place])   . Cataract   . Chest pain    Associated with weakness and fatigue  . Diabetes mellitus    A1c of 7.4 in 08/2010  . GERD (gastroesophageal reflux disease)   . Glaucoma   . Hyperlipidemia    Lipid profile in 08/2010:190, 121, 48, 118; normal CBC and CMet  . Hypertension    Lipid profile in 08/2010:190, 121, 48, 118; normal CBC and CMet  . Hypothyroidism   . Hypothyroidism   . Obesity    Past Surgical History:  Procedure Laterality Date  . CHOLECYSTECTOMY    . COLONOSCOPY N/A 04/01/2014   RMR: Melanosis coli. colonic polyps removed as described above.   . CYSTOSCOPY W/ URETERAL STENT PLACEMENT Right 09/13/2019   Procedure: CYSTOSCOPY WITH RIGHT  RETROGRADE PYELOGRAM; BLADDER BIOPSY;  Surgeon: Cleon Gustin, MD;  Location: [facility];  Service: Urology;  Laterality: Right;  . LEFT HEART CATH AND CORONARY ANGIOGRAPHY N/A 10/07/2017   Procedure: LEFT HEART CATH AND CORONARY ANGIOGRAPHY;  Surgeon: Troy Sine, MD;  Location: [facility];  Service: Cardiovascular;  Laterality: N/A;  . THYROIDECTOMY, PARTIAL      SOCIAL HISTORY:  Social History   Socioeconomic History  . Marital status: Widowed    Spouse name: Not on file  . Number of children: 7  . Years of education: Not on file  . Highest education level: Not  on file  Occupational History    Employer: RETIRED  Tobacco Use  . Smoking status: Passive Smoke Exposure - Never Smoker  . Smokeless tobacco: Never Used  Vaping Use  . Vaping Use: Never used  Substance and Sexual Activity  . Alcohol use: Never  . Drug use: Never  . Sexual activity: Not Currently  Other Topics Concern  . Not on file  Social History Narrative  . Not on file   Social Determinants of Health   Financial Resource Strain: Not on file  Food Insecurity: Not on file  Transportation Needs: No Transportation Needs  . Lack of  Transportation (Medical): No  . Lack of Transportation (Non-Medical): No  Physical Activity: Inactive  . Days of Exercise per Week: 0 days  . Minutes of Exercise per Session: 0 min  Stress: Not on file  Social Connections: Not on file  Intimate Partner Violence: Not At Risk  . Fear of Current or Ex-Partner: No  . Emotionally Abused: No  . Physically Abused: No  . Sexually Abused: No    FAMILY HISTORY:  Family History  Problem Relation Age of Onset  . Anemia Father   . Arthritis Mother   . Stroke Brother   . Cancer Sister        unknown kind  . Diabetes Sister   . Dementia Sister   . Cancer Niece        Breast  . Cancer Brother   . Healthy Son   . Healthy Son   . Healthy Son   . Healthy Daughter   . Healthy Daughter   . Healthy Daughter   . Sarcoidosis Daughter     CURRENT MEDICATIONS:  Current Outpatient Medications  Medication Sig Dispense Refill  . acyclovir (ZOVIRAX) 400 MG tablet Take 1 tablet (400 mg total) by mouth 2 (two) times daily. 60 tablet 6  . amLODipine (NORVASC) 10 MG tablet Take 1 tablet (10 mg total) by mouth daily. 90 tablet 3  . ASPIRIN 81 PO Take 1 tablet by mouth daily.    . ciprofloxacin (CIPRO) 500 MG tablet Take 1 tablet (500 mg total) by mouth 2 (two) times daily for 7 days. 14 tablet 0  . fluticasone (FLONASE) 50 MCG/ACT nasal spray Place 2 sprays into both nostrils daily. 16 g 6  . gabapentin (NEURONTIN) 300 MG capsule Take 1 capsule (300 mg total) by mouth at bedtime. 90 capsule 3  . glipiZIDE (GLUCOTROL) 5 MG tablet TAKE ONE TABLET ($RemoveBef'5MG'lsWPVWCWri$  TOTAL) BY MOUTH TWOTIMES DAILY 180 tablet 3  . Glucose Blood (BLOOD GLUCOSE TEST STRIPS) STRP Please dispense based on patient and insurance preference. Use as directed to monitor FSBS 2x daily. Dx: E11.9. 100 each 11  . HM LORATADINE 10 MG tablet TAKE ONE TABLET ($RemoveBef'10MG'vwOlDyxeQv$  TOTAL) BY MOUTH DAILY 30 tablet 0  . hydrochlorothiazide (MICROZIDE) 12.5 MG capsule TAKE ONE CAPSULE BY MOUTH DAILY (Patient taking  differently: Take 12.5 mg by mouth daily.) 90 capsule 3  . Lancets MISC Please dispense based on patient and insurance preference. Use as directed to monitor FSBS 2x daily. Dx: E11.9. 100 each 11  . lenalidomide (REVLIMID) 15 MG capsule Take 1 capsule (15 mg total) by mouth daily. 14 capsule 0  . lidocaine (XYLOCAINE) 2 % solution     . magic mouthwash w/lidocaine SOLN Take 5 mLs by mouth 4 (four) times daily. 450 mL 0  . magnesium oxide (MAG-OX) 400 (241.3 Mg) MG tablet Take 1 tablet (400 mg total) by mouth 2 (  two) times daily. 60 tablet 0  . Magnesium Oxide 400 (240 Mg) MG TABS Take 1 tablet by mouth 2 (two) times daily.    . meclizine (ANTIVERT) 25 MG tablet Take 25 mg by mouth every 6 (six) hours as needed for dizziness.     . metFORMIN (GLUCOPHAGE) 1000 MG tablet TAKE ONE TABLET BY MOUTH TWICE A DAY 60 tablet 3  . metoprolol tartrate (LOPRESSOR) 25 MG tablet Take 0.5 tablets (12.5 mg total) by mouth 2 (two) times daily. 90 tablet 3  . omeprazole (PRILOSEC) 40 MG capsule TAKE ONE CAPSULE BY MOUTH DAILY (Patient taking differently: Take 40 mg by mouth daily.) 90 capsule 3  . potassium chloride SA (KLOR-CON) 20 MEQ tablet Take 1 tablet (20 mEq total) by mouth 2 (two) times daily. 10 tablet 0  . prochlorperazine (COMPAZINE) 10 MG tablet Take 1 tablet (10 mg total) by mouth every 6 (six) hours as needed (Nausea or vomiting). 30 tablet 1  . simvastatin (ZOCOR) 40 MG tablet TAKE ONE TABLET (40MG TOTAL) BY MOUTH BEDTIME 90 tablet 3  . SYNTHROID 137 MCG tablet Take 137 mcg by mouth every morning.    . tolterodine (DETROL LA) 4 MG 24 hr capsule TAKE ONE CAPSULE BY MOUTH DAILY (Patient taking differently: Take 4 mg by mouth daily.) 90 capsule 3  . Blood Glucose Monitoring Suppl (BLOOD GLUCOSE SYSTEM PAK) KIT Please dispense based on patient and insurance preference. Use as directed to monitor FSBS 2x daily. Dx: E11.9. (Patient not taking: Reported on 03/26/2020) 1 each 1  . HYDROcodone-acetaminophen  (NORCO/VICODIN) 5-325 MG tablet Take 1 tablet by mouth every 6 (six) hours as needed for moderate pain. (Patient not taking: Reported on 03/26/2020) 60 tablet 0  . traMADol (ULTRAM) 50 MG tablet Take 1 tablet (50 mg total) by mouth 2 (two) times daily as needed. (Patient not taking: Reported on 03/26/2020) 60 tablet 0   No current facility-administered medications for this visit.    ALLERGIES:  No Known Allergies  PHYSICAL EXAM:  Performance status (ECOG): 3 - Symptomatic, >50% confined to bed  Vitals:   03/26/20 1137  BP: (!) 159/57  Pulse: 67  Resp: 18  Temp: (!) 96.8 F (36 C)  SpO2: 99%   Wt Readings from Last 3 Encounters:  03/26/20 183 lb 12.8 oz (83.4 kg)  03/19/20 168 lb (76.2 kg)  03/16/20 167 lb (75.8 kg)   Physical Exam Vitals reviewed.  Constitutional:      Appearance: Normal appearance. She is obese.     Comments: In wheelchair  Cardiovascular:     Rate and Rhythm: Normal rate and regular rhythm.     Pulses: Normal pulses.     Heart sounds: Normal heart sounds.  Pulmonary:     Effort: Pulmonary effort is normal.     Breath sounds: Normal breath sounds.  Skin:    General: Skin is dry.     Findings: Abrasion (on left back) present.  Neurological:     General: No focal deficit present.     Mental Status: She is alert and oriented to person, place, and time.  Psychiatric:        Mood and Affect: Mood normal.        Behavior: Behavior normal.     LABORATORY DATA:  I have reviewed the labs as listed.  CBC Latest Ref Rng & Units 03/26/2020 03/19/2020 03/12/2020  WBC 4.0 - 10.5 K/uL 8.1 10.3 10.4  Hemoglobin 12.0 - 15.0 g/dL 10.3(L) 10.2(L) 10.6(L)  Hematocrit 36.0 - 46.0 % 33.7(L) 31.7(L) 34.4(L)  Platelets 150 - 400 K/uL 265 170 207   CMP Latest Ref Rng & Units 03/26/2020 03/19/2020 03/12/2020  Glucose 70 - 99 mg/dL 211(H) 207(H) 273(H)  BUN 8 - 23 mg/dL 11 11 15   Creatinine 0.44 - 1.00 mg/dL 0.83 0.88 0.93  Sodium 135 - 145 mmol/L 139 142 136  Potassium 3.5  - 5.1 mmol/L 3.7 3.9 3.3(L)  Chloride 98 - 111 mmol/L 106 107 100  CO2 22 - 32 mmol/L 27 28 25   Calcium 8.9 - 10.3 mg/dL 9.2 8.7 8.9  Total Protein 6.5 - 8.1 g/dL 6.4(L) - 6.5  Total Bilirubin 0.3 - 1.2 mg/dL 0.8 - 1.1  Alkaline Phos 38 - 126 U/L 80 - 71  AST 15 - 41 U/L 13(L) - 16  ALT 0 - 44 U/L 12 - 12   Lab Results  Component Value Date   LDH 170 03/26/2020   LDH 171 03/12/2020   LDH 155 01/07/2020   Lab Results  Component Value Date   TOTALPROTELP 6.2 03/12/2020   TOTALPROTELP 6.2 03/12/2020   ALBUMINELP 3.8 03/12/2020   A1GS 0.2 03/12/2020   A2GS 0.7 03/12/2020   BETS 1.0 03/12/2020   GAMS 0.6 03/12/2020   MSPIKE Not Observed 03/12/2020   SPEI Comment 03/12/2020    Lab Results  Component Value Date   KPAFRELGTCHN 32.8 (H) 03/12/2020   LAMBDASER 22.1 03/12/2020   KAPLAMBRATIO 1.48 03/12/2020    DIAGNOSTIC IMAGING:  I have independently reviewed the scans and discussed with the patient. CT Head Wo Contrast  Result Date: 03/26/2020 CLINICAL DATA:  Altered mental status, syncope EXAM: CT HEAD WITHOUT CONTRAST TECHNIQUE: Contiguous axial images were obtained from the base of the skull through the vertex without intravenous contrast. COMPARISON:  None. FINDINGS: Brain: There is atrophy and chronic small vessel disease changes. No acute intracranial abnormality. Specifically, no hemorrhage, hydrocephalus, mass lesion, acute infarction, or significant intracranial injury. Vascular: No hyperdense vessel or unexpected calcification. Skull: No acute calvarial abnormality. Sinuses/Orbits: Visualized paranasal sinuses and mastoids clear. Orbital soft tissues unremarkable. Other: None IMPRESSION: Atrophy, chronic microvascular disease. No acute intracranial abnormality. Electronically Signed   By: Rolm Baptise M.D.   On: 03/26/2020 09:34     ASSESSMENT:  1. Normocytic anemia: -Patient seen at the request of Dr. Buelah Manis for further work-up and management of normocytic  anemia. -Recent CBC on 08/21/2019 shows hemoglobin 6.9 with MCV of 88.3. White count and platelets are normal. No history of CKD. Denies any bleeding per rectum or melena. Last colonoscopy on 04/01/2014 shows diffusely pigmented rectal mucosa consistent with melanosis [name] Diffusely pigmented colonic mucosa, 2 diminutive polyps in the mid ascending segment, otherwise the remainder of the colon mucosa was normal. Biopsy consistent with tubular adenoma. -CT renal study on 05/11/2019 shows normal-sized spleen with normal liver. No other abnormal adenopathy. -Denies any prior history of blood transfusion. She is currently taking iron tablet twice daily.  2. IgG lambda plasma cell myeloma: -Skeletal survey on 09/12/2019 shows diffuse faint lucencies in the skull, bilateral femurs. -Bone marrow biopsy on 09/27/2019 shows 70% plasma cells in hypercellular marrow. Chromosome analysis was 80, XX.FISH panel was normal. However quality of specimen is compromised. -LDH normal, beta-2 microglobulin 2.9. M spike was 2.1 g. Kappa light chains 19.7, lambda light chains 14.3 with ratio of 1.38. -24-hour urine shows nonnephrotic range proteinuria. Urine immunofixation was positive.   PLAN:  1.IgG lambda plasma cell myeloma: -She is tolerating  Revlimid and Velcade very well.  Revlimid is 15 mg 2 weeks on 1 week off. -She is continuing dexamethasone 20 mg weekly very well. -Reviewed myeloma panel from 03/12/2020.  M spike is negative.  Lambda light chains are 22.1, kappa light chains 32.8 with ratio of 1.48.  Immunofixation was negative. -She has gotten excellent response.  She will proceed with cycle 8 today. -We have sent myeloma panel today.  We will see her back in 4 weeks for follow-up.  If all 3 tests are staying in remission, will discontinue Velcade and continue Revlimid and dexamethasone. As she reported foul-smelling urine today.  We will do UA and urine culture.  We will  start her on Cipro 500 twice daily for 7 days.  2. Diabetes: -Continue glipizide and Metformin.  3. Hypertension: -Continue amlodipine and HCTZ.  4. Hypothyroidism: -Continue Synthroid daily.  5. B12 deficiency: -Continue monthly B12 injections.  6.  Left knee pain: -Continue tramadol 50 mg every 12 hours as needed.  7. Infection prophylaxis: -Continue acyclovir 400 mg twice daily and aspirin 81 mg daily.  8.  Syncopal episodes: -She had reportedly 2 episodes of syncope.  First episode was when she was bending forward and washing hair.  Second episode of was when she was sitting on the commode and straining.  After second episode she was evaluated in the ER at [facility].  Thought to be secondary to Valsalva reflex. -I have reviewed CT head without contrast from today which did not show any abnormality.   Orders placed this encounter:  Orders Placed This Encounter  Procedures  . Urine culture  . Urinalysis, dipstick only     Derek Jack, MD [place] [phone]   I, Milinda Antis, am acting as a scribe for Dr. Sanda Linger.  I, Derek Jack MD, have reviewed the above documentation for accuracy and completeness, and I agree with the above.

## 2020-03-26 NOTE — Progress Notes (Signed)
Kim Mills presents today for injection per the provider's orders.  Velcade administration without incident; injection site WNL; see MAR for injection details.  Patient tolerated procedure well and without incident.  No questions or complaints noted at this time. Discharged via wheelchair in c/o daughter in stable condition.

## 2020-03-27 LAB — KAPPA/LAMBDA LIGHT CHAINS
Kappa free light chain: 32 mg/L — ABNORMAL HIGH (ref 3.3–19.4)
Kappa, lambda light chain ratio: 1.48 (ref 0.26–1.65)
Lambda free light chains: 21.6 mg/L (ref 5.7–26.3)

## 2020-03-27 LAB — URINE CULTURE: Culture: NO GROWTH

## 2020-03-28 LAB — PROTEIN ELECTROPHORESIS, SERUM
A/G Ratio: 1.2 (ref 0.7–1.7)
Albumin ELP: 3.4 g/dL (ref 2.9–4.4)
Alpha-1-Globulin: 0.3 g/dL (ref 0.0–0.4)
Alpha-2-Globulin: 0.7 g/dL (ref 0.4–1.0)
Beta Globulin: 1.2 g/dL (ref 0.7–1.3)
Gamma Globulin: 0.7 g/dL (ref 0.4–1.8)
Globulin, Total: 2.9 g/dL (ref 2.2–3.9)
Total Protein ELP: 6.3 g/dL (ref 6.0–8.5)

## 2020-03-29 LAB — IMMUNOFIXATION ELECTROPHORESIS
IgA: 341 mg/dL (ref 64–422)
IgG (Immunoglobin G), Serum: 756 mg/dL (ref 586–1602)
IgM (Immunoglobulin M), Srm: 21 mg/dL — ABNORMAL LOW (ref 26–217)
Total Protein ELP: 5.8 g/dL — ABNORMAL LOW (ref 6.0–8.5)

## 2020-03-31 ENCOUNTER — Other Ambulatory Visit (HOSPITAL_COMMUNITY): Payer: Self-pay

## 2020-03-31 MED ORDER — LENALIDOMIDE 15 MG PO CAPS: 15.0000 mg | ORAL_CAPSULE | Freq: Every day | ORAL | 0 refills | Status: DC

## 2020-03-31 NOTE — Telephone Encounter (Signed)
Chart reviewed. Revlimid refilled per Dr. Delton Coombes

## 2020-04-02 ENCOUNTER — Encounter (HOSPITAL_COMMUNITY): Payer: Self-pay

## 2020-04-02 ENCOUNTER — Inpatient Hospital Stay (HOSPITAL_COMMUNITY): Payer: Medicare Other

## 2020-04-02 ENCOUNTER — Other Ambulatory Visit: Payer: Self-pay

## 2020-04-02 VITALS — BP 151/48 | HR 73 | Temp 97.3°F | Resp 18 | Wt 181.4 lb

## 2020-04-02 DIAGNOSIS — C9000 Multiple myeloma not having achieved remission: Secondary | ICD-10-CM

## 2020-04-02 DIAGNOSIS — Z5112 Encounter for antineoplastic immunotherapy: Secondary | ICD-10-CM | POA: Diagnosis not present

## 2020-04-02 DIAGNOSIS — Z79899 Other long term (current) drug therapy: Secondary | ICD-10-CM | POA: Diagnosis not present

## 2020-04-02 LAB — COMPREHENSIVE METABOLIC PANEL
ALT: 9 U/L (ref 0–44)
AST: 12 U/L — ABNORMAL LOW (ref 15–41)
Albumin: 3.6 g/dL (ref 3.5–5.0)
Alkaline Phosphatase: 73 U/L (ref 38–126)
Anion gap: 9 (ref 5–15)
BUN: 15 mg/dL (ref 8–23)
CO2: 29 mmol/L (ref 22–32)
Calcium: 9.2 mg/dL (ref 8.9–10.3)
Chloride: 100 mmol/L (ref 98–111)
Creatinine, Ser: 0.86 mg/dL (ref 0.44–1.00)
GFR, Estimated: >60 mL/min (ref >60)
Glucose, Bld: 244 mg/dL — ABNORMAL HIGH (ref 70–99)
Potassium: 4.1 mmol/L (ref 3.5–5.1)
Sodium: 138 mmol/L (ref 135–145)
Total Bilirubin: 0.9 mg/dL (ref 0.3–1.2)
Total Protein: 6.7 g/dL (ref 6.5–8.1)

## 2020-04-02 LAB — CBC WITH DIFFERENTIAL/PLATELET
Abs Immature Granulocytes: 0.07 10*3/uL (ref 0.00–0.07)
Basophils Absolute: 0 10*3/uL (ref 0.0–0.1)
Basophils Relative: 1 %
Eosinophils Absolute: 0.4 10*3/uL (ref 0.0–0.5)
Eosinophils Relative: 6 %
HCT: 35.5 % — ABNORMAL LOW (ref 36.0–46.0)
Hemoglobin: 11 g/dL — ABNORMAL LOW (ref 12.0–15.0)
Immature Granulocytes: 1 %
Lymphocytes Relative: 29 %
Lymphs Abs: 2.3 10*3/uL (ref 0.7–4.0)
MCH: 29.5 pg (ref 26.0–34.0)
MCHC: 31 g/dL (ref 30.0–36.0)
MCV: 95.2 fL (ref 80.0–100.0)
Monocytes Absolute: 0.5 10*3/uL (ref 0.1–1.0)
Monocytes Relative: 7 %
Neutro Abs: 4.5 10*3/uL (ref 1.7–7.7)
Neutrophils Relative %: 56 %
Platelets: 236 10*3/uL (ref 150–400)
RBC: 3.73 MIL/uL — ABNORMAL LOW (ref 3.87–5.11)
RDW: 19.5 % — ABNORMAL HIGH (ref 11.5–15.5)
WBC: 7.8 10*3/uL (ref 4.0–10.5)
nRBC: 0 % (ref 0.0–0.2)

## 2020-04-02 MED ORDER — BORTEZOMIB CHEMO SQ INJECTION 3.5 MG (2.5MG/ML)
1.3000 mg/m2 | Freq: Once | INTRAMUSCULAR | Status: AC
  Administered 2020-04-02: 2.25 mg via SUBCUTANEOUS
  Filled 2020-04-02: qty 0.9

## 2020-04-02 MED ORDER — DEXAMETHASONE 4 MG PO TABS
20.0000 mg | ORAL_TABLET | Freq: Once | ORAL | Status: AC
  Administered 2020-04-02: 20 mg via ORAL
  Filled 2020-04-02: qty 5

## 2020-04-02 NOTE — Progress Notes (Signed)
To treatment room for veclade injection.  No complaints voiced with revlimid.  Family at side.  No s/s of distress noted.   Patient tolerated Velcade injection with no complaints voiced.  Lab work reviewed.  See MAR for details.  Injection site clean and dry with no bruising or swelling noted.  Band aid applied.  VSS.  Patient left in satisfactory condition with no s/s of distress noted.

## 2020-04-03 ENCOUNTER — Telehealth: Payer: Self-pay | Admitting: Nurse Practitioner

## 2020-04-03 ENCOUNTER — Other Ambulatory Visit: Payer: Self-pay | Admitting: Family Medicine

## 2020-04-03 DIAGNOSIS — R918 Other nonspecific abnormal finding of lung field: Secondary | ICD-10-CM

## 2020-04-03 MED ORDER — AZITHROMYCIN 250 MG PO TABS: ORAL_TABLET | ORAL | 0 refills | Status: DC

## 2020-04-03 MED ORDER — AMOXICILLIN-POT CLAVULANATE 875-125 MG PO TABS
1.0000 | ORAL_TABLET | Freq: Two times a day (BID) | ORAL | 0 refills | Status: AC
Start: 2020-04-03 — End: 2020-04-10

## 2020-04-03 NOTE — Telephone Encounter (Signed)
-----   Message from Amalia Hailey, [place] sent at 04/03/2020  8:17 AM EST ----- Spoke with Corky Sing, pt daughter says that the is a wheezing sound when her mother is breathing. The pharmacy to send medication to is the [place] pharmacy. ----- Message ----- From: Eulogio Bear, NP Sent: 04/03/2020   6:10 AM EST To: Amalia Hailey, CMA  Please call and check in with patient - how is her breathing feeling?  The chest x-ray showed some abnormalities in the bottoms of her lungs; it showed either some scarring or pneumonia.  If she is having upper respiratory symptoms like cough, shortness of breath, or fever we can treat her for pneumonia and see if this helps.  If she is not having any respiratory symptoms, we can consider getting further testing with CT scan or getting her in with a lung doctor.

## 2020-04-03 NOTE — Telephone Encounter (Signed)
Please let daughter know 2 antibiotics have been called in.  I would like for her to take both of them, one is twice daily for 7 days and the other once daily for 5 days.  Please have her schedule f/u with [place] either before Dr. Buelah Manis leaves or with new PCP in next 2 weeks.  Will also want to get a repeat chest x-ray in about 6 weeks.

## 2020-04-03 NOTE — Telephone Encounter (Signed)
Spoke with daughter regarding medication

## 2020-04-09 ENCOUNTER — Encounter (HOSPITAL_COMMUNITY): Payer: Self-pay

## 2020-04-09 ENCOUNTER — Inpatient Hospital Stay (HOSPITAL_COMMUNITY): Payer: Medicare Other | Attending: Hematology

## 2020-04-09 ENCOUNTER — Other Ambulatory Visit: Payer: Self-pay

## 2020-04-09 ENCOUNTER — Inpatient Hospital Stay (HOSPITAL_COMMUNITY): Payer: Medicare Other

## 2020-04-09 VITALS — BP 149/63 | HR 72 | Temp 96.7°F | Resp 18

## 2020-04-09 DIAGNOSIS — Z5112 Encounter for antineoplastic immunotherapy: Secondary | ICD-10-CM | POA: Diagnosis not present

## 2020-04-09 DIAGNOSIS — C9000 Multiple myeloma not having achieved remission: Secondary | ICD-10-CM | POA: Insufficient documentation

## 2020-04-09 DIAGNOSIS — E119 Type 2 diabetes mellitus without complications: Secondary | ICD-10-CM | POA: Insufficient documentation

## 2020-04-09 DIAGNOSIS — Z7984 Long term (current) use of oral hypoglycemic drugs: Secondary | ICD-10-CM | POA: Diagnosis not present

## 2020-04-09 LAB — CBC WITH DIFFERENTIAL/PLATELET
Abs Immature Granulocytes: 0.09 10*3/uL — ABNORMAL HIGH (ref 0.00–0.07)
Basophils Absolute: 0.1 10*3/uL (ref 0.0–0.1)
Basophils Relative: 1 %
Eosinophils Absolute: 0.6 10*3/uL — ABNORMAL HIGH (ref 0.0–0.5)
Eosinophils Relative: 6 %
HCT: 36.6 % (ref 36.0–46.0)
Hemoglobin: 11.2 g/dL — ABNORMAL LOW (ref 12.0–15.0)
Immature Granulocytes: 1 %
Lymphocytes Relative: 27 %
Lymphs Abs: 2.7 10*3/uL (ref 0.7–4.0)
MCH: 28.6 pg (ref 26.0–34.0)
MCHC: 30.6 g/dL (ref 30.0–36.0)
MCV: 93.6 fL (ref 80.0–100.0)
Monocytes Absolute: 1 10*3/uL (ref 0.1–1.0)
Monocytes Relative: 10 %
Neutro Abs: 5.6 10*3/uL (ref 1.7–7.7)
Neutrophils Relative %: 55 %
Platelets: 156 10*3/uL (ref 150–400)
RBC: 3.91 MIL/uL (ref 3.87–5.11)
RDW: 18.8 % — ABNORMAL HIGH (ref 11.5–15.5)
WBC: 10.1 10*3/uL (ref 4.0–10.5)
nRBC: 0 % (ref 0.0–0.2)

## 2020-04-09 LAB — COMPREHENSIVE METABOLIC PANEL
ALT: 9 U/L (ref 0–44)
AST: 12 U/L — ABNORMAL LOW (ref 15–41)
Albumin: 3.4 g/dL — ABNORMAL LOW (ref 3.5–5.0)
Alkaline Phosphatase: 72 U/L (ref 38–126)
Anion gap: 10 (ref 5–15)
BUN: 15 mg/dL (ref 8–23)
CO2: 28 mmol/L (ref 22–32)
Calcium: 8.7 mg/dL — ABNORMAL LOW (ref 8.9–10.3)
Chloride: 102 mmol/L (ref 98–111)
Creatinine, Ser: 0.86 mg/dL (ref 0.44–1.00)
GFR, Estimated: >60 mL/min (ref >60)
Glucose, Bld: 252 mg/dL — ABNORMAL HIGH (ref 70–99)
Potassium: 3.9 mmol/L (ref 3.5–5.1)
Sodium: 140 mmol/L (ref 135–145)
Total Bilirubin: 1.4 mg/dL — ABNORMAL HIGH (ref 0.3–1.2)
Total Protein: 6.6 g/dL (ref 6.5–8.1)

## 2020-04-09 MED ORDER — BORTEZOMIB CHEMO SQ INJECTION 3.5 MG (2.5MG/ML)
1.3000 mg/m2 | Freq: Once | INTRAMUSCULAR | Status: AC
  Administered 2020-04-09: 2.25 mg via SUBCUTANEOUS
  Filled 2020-04-09: qty 0.9

## 2020-04-09 MED ORDER — DEXAMETHASONE 4 MG PO TABS
20.0000 mg | ORAL_TABLET | Freq: Once | ORAL | Status: AC
  Administered 2020-04-09: 20 mg via ORAL
  Filled 2020-04-09: qty 5

## 2020-04-09 NOTE — Progress Notes (Signed)
Patient tolerated Velcade injection with no complaints voiced.  Lab work reviewed.  See MAR for details.  Injection site clean and dry with no bruising or swelling noted.  Band aid applied.  VSS.  Patient left in satisfactory condition with no s/s of distress noted.

## 2020-04-16 ENCOUNTER — Inpatient Hospital Stay (HOSPITAL_COMMUNITY): Payer: Medicare Other

## 2020-04-16 ENCOUNTER — Other Ambulatory Visit (HOSPITAL_COMMUNITY): Payer: Self-pay

## 2020-04-16 ENCOUNTER — Other Ambulatory Visit: Payer: Self-pay

## 2020-04-16 ENCOUNTER — Encounter (HOSPITAL_COMMUNITY): Payer: Self-pay

## 2020-04-16 VITALS — BP 150/66 | HR 65 | Temp 96.9°F | Resp 18 | Wt 180.0 lb

## 2020-04-16 DIAGNOSIS — E119 Type 2 diabetes mellitus without complications: Secondary | ICD-10-CM | POA: Diagnosis not present

## 2020-04-16 DIAGNOSIS — C9000 Multiple myeloma not having achieved remission: Secondary | ICD-10-CM

## 2020-04-16 DIAGNOSIS — Z5112 Encounter for antineoplastic immunotherapy: Secondary | ICD-10-CM | POA: Diagnosis not present

## 2020-04-16 DIAGNOSIS — Z7984 Long term (current) use of oral hypoglycemic drugs: Secondary | ICD-10-CM | POA: Diagnosis not present

## 2020-04-16 LAB — COMPREHENSIVE METABOLIC PANEL
ALT: 36 U/L (ref 0–44)
AST: 23 U/L (ref 15–41)
Albumin: 3.4 g/dL — ABNORMAL LOW (ref 3.5–5.0)
Alkaline Phosphatase: 75 U/L (ref 38–126)
Anion gap: 10 (ref 5–15)
BUN: 13 mg/dL (ref 8–23)
CO2: 27 mmol/L (ref 22–32)
Calcium: 8.9 mg/dL (ref 8.9–10.3)
Chloride: 102 mmol/L (ref 98–111)
Creatinine, Ser: 0.94 mg/dL (ref 0.44–1.00)
GFR, Estimated: >60 mL/min (ref >60)
Glucose, Bld: 235 mg/dL — ABNORMAL HIGH (ref 70–99)
Potassium: 3.7 mmol/L (ref 3.5–5.1)
Sodium: 139 mmol/L (ref 135–145)
Total Bilirubin: 1.2 mg/dL (ref 0.3–1.2)
Total Protein: 6.5 g/dL (ref 6.5–8.1)

## 2020-04-16 LAB — CBC WITH DIFFERENTIAL/PLATELET
Abs Immature Granulocytes: 0.05 10*3/uL (ref 0.00–0.07)
Basophils Absolute: 0.1 10*3/uL (ref 0.0–0.1)
Basophils Relative: 1 %
Eosinophils Absolute: 0.8 10*3/uL — ABNORMAL HIGH (ref 0.0–0.5)
Eosinophils Relative: 8 %
HCT: 36.6 % (ref 36.0–46.0)
Hemoglobin: 11.1 g/dL — ABNORMAL LOW (ref 12.0–15.0)
Immature Granulocytes: 1 %
Lymphocytes Relative: 29 %
Lymphs Abs: 3 10*3/uL (ref 0.7–4.0)
MCH: 29.4 pg (ref 26.0–34.0)
MCHC: 30.3 g/dL (ref 30.0–36.0)
MCV: 96.8 fL (ref 80.0–100.0)
Monocytes Absolute: 0.9 10*3/uL (ref 0.1–1.0)
Monocytes Relative: 9 %
Neutro Abs: 5.4 10*3/uL (ref 1.7–7.7)
Neutrophils Relative %: 52 %
Platelets: 194 10*3/uL (ref 150–400)
RBC: 3.78 MIL/uL — ABNORMAL LOW (ref 3.87–5.11)
RDW: 19.3 % — ABNORMAL HIGH (ref 11.5–15.5)
WBC: 10.2 10*3/uL (ref 4.0–10.5)
nRBC: 0.2 % (ref 0.0–0.2)

## 2020-04-16 MED ORDER — DEXAMETHASONE 4 MG PO TABS
20.0000 mg | ORAL_TABLET | Freq: Once | ORAL | Status: AC
  Administered 2020-04-16: 20 mg via ORAL
  Filled 2020-04-16: qty 5

## 2020-04-16 MED ORDER — LENALIDOMIDE 15 MG PO CAPS: 15.0000 mg | ORAL_CAPSULE | Freq: Every day | ORAL | 0 refills | Status: DC

## 2020-04-16 MED ORDER — BORTEZOMIB CHEMO SQ INJECTION 3.5 MG (2.5MG/ML)
1.3000 mg/m2 | Freq: Once | INTRAMUSCULAR | Status: AC
  Administered 2020-04-16: 2.25 mg via SUBCUTANEOUS
  Filled 2020-04-16: qty 0.9

## 2020-04-16 NOTE — Telephone Encounter (Signed)
Chart reviewed. Revlimid refilled per Dr. Delton Coombes

## 2020-04-16 NOTE — Progress Notes (Signed)
Patient tolerated Velcade injection with no complaints voiced. Lab work reviewed. See MAR for details. Injection site clean and dry with no bruising or swelling noted. Patient stable during and after injection. Band aid applied. VSS. Patient left in satisfactory condition with no s/s of distress noted. 

## 2020-04-23 ENCOUNTER — Other Ambulatory Visit: Payer: Self-pay

## 2020-04-23 ENCOUNTER — Inpatient Hospital Stay (HOSPITAL_COMMUNITY): Payer: Medicare Other

## 2020-04-23 ENCOUNTER — Inpatient Hospital Stay (HOSPITAL_BASED_OUTPATIENT_CLINIC_OR_DEPARTMENT_OTHER): Payer: Medicare Other | Admitting: Hematology

## 2020-04-23 VITALS — BP 150/59 | HR 82 | Temp 98.7°F | Resp 18 | Wt 183.1 lb

## 2020-04-23 DIAGNOSIS — C9000 Multiple myeloma not having achieved remission: Secondary | ICD-10-CM

## 2020-04-23 DIAGNOSIS — D649 Anemia, unspecified: Secondary | ICD-10-CM | POA: Diagnosis not present

## 2020-04-23 DIAGNOSIS — Z5112 Encounter for antineoplastic immunotherapy: Secondary | ICD-10-CM | POA: Diagnosis not present

## 2020-04-23 DIAGNOSIS — E119 Type 2 diabetes mellitus without complications: Secondary | ICD-10-CM | POA: Diagnosis not present

## 2020-04-23 DIAGNOSIS — C9001 Multiple myeloma in remission: Secondary | ICD-10-CM | POA: Diagnosis not present

## 2020-04-23 DIAGNOSIS — R739 Hyperglycemia, unspecified: Secondary | ICD-10-CM

## 2020-04-23 DIAGNOSIS — Z7984 Long term (current) use of oral hypoglycemic drugs: Secondary | ICD-10-CM | POA: Diagnosis not present

## 2020-04-23 LAB — CBC WITH DIFFERENTIAL/PLATELET
Abs Immature Granulocytes: 0.17 10*3/uL — ABNORMAL HIGH (ref 0.00–0.07)
Basophils Absolute: 0.1 10*3/uL (ref 0.0–0.1)
Basophils Relative: 1 %
Eosinophils Absolute: 0.8 10*3/uL — ABNORMAL HIGH (ref 0.0–0.5)
Eosinophils Relative: 9 %
HCT: 37 % (ref 36.0–46.0)
Hemoglobin: 11.4 g/dL — ABNORMAL LOW (ref 12.0–15.0)
Immature Granulocytes: 2 %
Lymphocytes Relative: 28 %
Lymphs Abs: 2.5 10*3/uL (ref 0.7–4.0)
MCH: 29.3 pg (ref 26.0–34.0)
MCHC: 30.8 g/dL (ref 30.0–36.0)
MCV: 95.1 fL (ref 80.0–100.0)
Monocytes Absolute: 0.4 10*3/uL (ref 0.1–1.0)
Monocytes Relative: 5 %
Neutro Abs: 5 10*3/uL (ref 1.7–7.7)
Neutrophils Relative %: 55 %
Platelets: 253 10*3/uL (ref 150–400)
RBC: 3.89 MIL/uL (ref 3.87–5.11)
RDW: 19 % — ABNORMAL HIGH (ref 11.5–15.5)
WBC: 9 10*3/uL (ref 4.0–10.5)
nRBC: 0.2 % (ref 0.0–0.2)

## 2020-04-23 LAB — COMPREHENSIVE METABOLIC PANEL
ALT: 29 U/L (ref 0–44)
AST: 20 U/L (ref 15–41)
Albumin: 3.4 g/dL — ABNORMAL LOW (ref 3.5–5.0)
Alkaline Phosphatase: 73 U/L (ref 38–126)
Anion gap: 11 (ref 5–15)
BUN: 10 mg/dL (ref 8–23)
CO2: 28 mmol/L (ref 22–32)
Calcium: 8.5 mg/dL — ABNORMAL LOW (ref 8.9–10.3)
Chloride: 98 mmol/L (ref 98–111)
Creatinine, Ser: 0.82 mg/dL (ref 0.44–1.00)
GFR, Estimated: >60 mL/min (ref >60)
Glucose, Bld: 387 mg/dL — ABNORMAL HIGH (ref 70–99)
Potassium: 3.6 mmol/L (ref 3.5–5.1)
Sodium: 137 mmol/L (ref 135–145)
Total Bilirubin: 1 mg/dL (ref 0.3–1.2)
Total Protein: 6.3 g/dL — ABNORMAL LOW (ref 6.5–8.1)

## 2020-04-23 MED ORDER — DEXAMETHASONE 4 MG PO TABS
10.0000 mg | ORAL_TABLET | Freq: Once | ORAL | Status: AC
  Administered 2020-04-23: 10 mg via ORAL

## 2020-04-23 MED ORDER — DENOSUMAB 120 MG/1.7ML ~~LOC~~ SOLN
SUBCUTANEOUS | Status: AC
  Filled 2020-04-23: qty 1.7

## 2020-04-23 MED ORDER — DENOSUMAB 120 MG/1.7ML ~~LOC~~ SOLN
120.0000 mg | Freq: Once | SUBCUTANEOUS | Status: AC
  Administered 2020-04-23: 120 mg via SUBCUTANEOUS

## 2020-04-23 MED ORDER — BORTEZOMIB CHEMO SQ INJECTION 3.5 MG (2.5MG/ML)
1.3000 mg/m2 | Freq: Once | INTRAMUSCULAR | Status: AC
  Administered 2020-04-23: 2.25 mg via SUBCUTANEOUS
  Filled 2020-04-23: qty 0.9

## 2020-04-23 MED ORDER — DEXAMETHASONE 4 MG PO TABS
ORAL_TABLET | ORAL | Status: AC
  Filled 2020-04-23: qty 3

## 2020-04-23 MED ORDER — INSULIN ASPART 100 UNIT/ML ~~LOC~~ SOLN
10.0000 [IU] | Freq: Once | SUBCUTANEOUS | Status: AC
  Administered 2020-04-23: 10 [IU] via SUBCUTANEOUS
  Filled 2020-04-23: qty 0.1

## 2020-04-23 NOTE — Progress Notes (Signed)
[name] presents today for injection per the provider's orders.  Novolog, Xgeva, and Velcade administration without incident; injection site WNL; see MAR for injection details.  Patient tolerated procedure well and without incident.  No questions or complaints noted at this time.  Discharged via wheelchair in stable condition in c/o daughter.

## 2020-04-23 NOTE — Progress Notes (Signed)
Patient was assessed by Dr. Delton Coombes and labs have been reviewed.  Patient is okay to proceed with treatment today insulin 10 units once for blood sugar of 387. Cutting dexamethasone dose down to 10 mg.  Dr. Delton Coombes is ordering Xgeva injections.  Primary RN and pharmacy aware.

## 2020-04-23 NOTE — Patient Instructions (Addendum)
[place] at [facility] Discharge Instructions  You were seen today by Dr. Delton Coombes. He went over your recent results. You received your injection today; you will receive 1 more injection, then the Velcade will be stopped. Continue taking the Revlimid 2 weeks on with 1 week off. You will also be started on Xgeva injections which strengthen your bones and will be given once a month; look out for any new jaw pain or exposed bone in your gumline and call the office immediately if you notice these symptoms. Dr. Delton Coombes will see you back in 1 month for labs and follow up.   Thank you for choosing [place] at [facility] to provide your oncology and hematology care.  To afford each patient quality time with our provider, please arrive at least 15 minutes before your scheduled appointment time.   If you have a lab appointment with the [place] please come in thru the Main Entrance and check in at the main information desk  You need to re-schedule your appointment should you arrive 10 or more minutes late.  We strive to give you quality time with our providers, and arriving late affects you and other patients whose appointments are after yours.  Also, if you no show three or more times for appointments you may be dismissed from the clinic at the providers discretion.     Again, thank you for choosing [facility].  Our hope is that these requests will decrease the amount of time that you wait before being seen by our physicians.       _____________________________________________________________  Should you have questions after your visit to [facility], please contact our office at [phone] between the hours of 8:00 a.m. and 4:30 p.m.  Voicemails left after 4:00 p.m. will not be returned until the following business day.  For prescription refill requests, have your pharmacy contact our office and allow 72 hours.     Cancer Center Support Programs:   > Cancer Support Group  2nd Tuesday of the month 1pm-2pm, Journey Room

## 2020-04-23 NOTE — Progress Notes (Signed)
[place], [place] [id number]   CLINIC:  Medical Oncology/Hematology  PCP:  Lindell Spar, MD [address] / [place] [address] [phone]   REASON FOR VISIT:  Follow-up for multiple myeloma & normocytic anemia  PRIOR THERAPY: None  NGS Results: Not done  CURRENT THERAPY: Velcade weekly; Revlimid 2/3 weeks  BRIEF ONCOLOGIC HISTORY:  Oncology History  Multiple myeloma not having achieved remission ([place])  10/13/2019 Initial Diagnosis   Multiple myeloma not having achieved remission ([facility])   10/29/2019 -  Chemotherapy    Patient is on Treatment Plan: MYELOMA NON-TRANSPLANT CANDIDATES VRD WEEKLY Q21D        CANCER STAGING: Cancer Staging No matching staging information was found for the patient.  INTERVAL HISTORY:  [place], a 80 y.o. female, returns for routine follow-up and consideration for next cycle of chemotherapy. Kim [place] was last seen on 03/26/2020.  Due for day #8 of cycle #9 of bortezomib today.   Today she is accompanied by her daughter. Overall, she tells me she has been feeling okay. She denies having any more fainting spells, lightheadedness or falls. She denies having any numbness or tingling, though she notes that she had constipation followed by diarrhea for 1 week. She is taking Revlimid 2 weeks on and 1 week off. She does not take steroid tablets at home, only when she comes to the [facility]. She has not seen her dentist in 2 years.  Overall, she feels ready for next cycle of chemo today.    REVIEW OF SYSTEMS:  Review of Systems  Constitutional: Positive for appetite change (75%) and fatigue (25%).  Gastrointestinal: Positive for constipation (x1 week) and diarrhea (x1 week following constipation).  Neurological: Positive for headaches (occasional). Negative for dizziness and light-headedness.  All other systems reviewed and are negative.   PAST MEDICAL/SURGICAL HISTORY:  Past Medical  History:  Diagnosis Date  . Arthritis   . Cancer ([place])   . Cataract   . Chest pain    Associated with weakness and fatigue  . Diabetes mellitus    A1c of 7.4 in 08/2010  . GERD (gastroesophageal reflux disease)   . Glaucoma   . Hyperlipidemia    Lipid profile in 08/2010:190, 121, 48, 118; normal CBC and CMet  . Hypertension    Lipid profile in 08/2010:190, 121, 48, 118; normal CBC and CMet  . Hypothyroidism   . Hypothyroidism   . Obesity    Past Surgical History:  Procedure Laterality Date  . CHOLECYSTECTOMY    . COLONOSCOPY N/A 04/01/2014   RMR: Melanosis coli. colonic polyps removed as described above.   . CYSTOSCOPY W/ URETERAL STENT PLACEMENT Right 09/13/2019   Procedure: CYSTOSCOPY WITH RIGHT  RETROGRADE PYELOGRAM; BLADDER BIOPSY;  Surgeon: Cleon Gustin, MD;  Location: [facility];  Service: Urology;  Laterality: Right;  . LEFT HEART CATH AND CORONARY ANGIOGRAPHY N/A 10/07/2017   Procedure: LEFT HEART CATH AND CORONARY ANGIOGRAPHY;  Surgeon: Troy Sine, MD;  Location: [facility];  Service: Cardiovascular;  Laterality: N/A;  . THYROIDECTOMY, PARTIAL      SOCIAL HISTORY:  Social History   Socioeconomic History  . Marital status: Widowed    Spouse name: Not on file  . Number of children: 7  . Years of education: Not on file  . Highest education level: Not on file  Occupational History    Employer: RETIRED  Tobacco Use  . Smoking status: Passive Smoke  Exposure - Never Smoker  . Smokeless tobacco: Never Used  Vaping Use  . Vaping Use: Never used  Substance and Sexual Activity  . Alcohol use: Never  . Drug use: Never  . Sexual activity: Not Currently  Other Topics Concern  . Not on file  Social History Narrative  . Not on file   Social Determinants of Health   Financial Resource Strain: Not on file  Food Insecurity: Not on file  Transportation Needs: No Transportation Needs  . Lack of Transportation (Medical): No  . Lack of Transportation  (Non-Medical): No  Physical Activity: Inactive  . Days of Exercise per Week: 0 days  . Minutes of Exercise per Session: 0 min  Stress: Not on file  Social Connections: Not on file  Intimate Partner Violence: Not At Risk  . Fear of Current or Ex-Partner: No  . Emotionally Abused: No  . Physically Abused: No  . Sexually Abused: No    FAMILY HISTORY:  Family History  Problem Relation Age of Onset  . Anemia Father   . Arthritis Mother   . Stroke Brother   . Cancer Sister        unknown kind  . Diabetes Sister   . Dementia Sister   . Cancer Niece        Breast  . Cancer Brother   . Healthy Son   . Healthy Son   . Healthy Son   . Healthy Daughter   . Healthy Daughter   . Healthy Daughter   . Sarcoidosis Daughter     CURRENT MEDICATIONS:  Current Outpatient Medications  Medication Sig Dispense Refill  . acyclovir (ZOVIRAX) 400 MG tablet Take 1 tablet (400 mg total) by mouth 2 (two) times daily. 60 tablet 6  . amLODipine (NORVASC) 10 MG tablet Take 1 tablet (10 mg total) by mouth daily. 90 tablet 3  . ASPIRIN 81 PO Take 1 tablet by mouth daily.    Marland Kitchen azithromycin (ZITHROMAX) 250 MG tablet Take 2 tablets (500 mg) on day 1 and then 1 tablet (250 mg) days 2-5. 6 tablet 0  . Blood Glucose Monitoring Suppl (BLOOD GLUCOSE SYSTEM PAK) KIT Please dispense based on patient and insurance preference. Use as directed to monitor FSBS 2x daily. Dx: E11.9. 1 each 1  . fluticasone (FLONASE) 50 MCG/ACT nasal spray Place 2 sprays into both nostrils daily. 16 g 6  . gabapentin (NEURONTIN) 300 MG capsule Take 1 capsule (300 mg total) by mouth at bedtime. 90 capsule 3  . glipiZIDE (GLUCOTROL) 5 MG tablet TAKE ONE TABLET ($RemoveBef'5MG'mhXSpVloyH$  TOTAL) BY MOUTH TWOTIMES DAILY 180 tablet 3  . Glucose Blood (BLOOD GLUCOSE TEST STRIPS) STRP Please dispense based on patient and insurance preference. Use as directed to monitor FSBS 2x daily. Dx: E11.9. 100 each 11  . HM LORATADINE 10 MG tablet TAKE ONE TABLET ($RemoveBef'10MG'bnLxBqvXAo$  TOTAL) BY  MOUTH DAILY 30 tablet 0  . hydrochlorothiazide (MICROZIDE) 12.5 MG capsule TAKE ONE CAPSULE BY MOUTH DAILY (Patient taking differently: Take 12.5 mg by mouth daily.) 90 capsule 3  . HYDROcodone-acetaminophen (NORCO/VICODIN) 5-325 MG tablet Take 1 tablet by mouth every 6 (six) hours as needed for moderate pain. 60 tablet 0  . Lancets MISC Please dispense based on patient and insurance preference. Use as directed to monitor FSBS 2x daily. Dx: E11.9. 100 each 11  . lenalidomide (REVLIMID) 15 MG capsule Take 1 capsule (15 mg total) by mouth daily. 14 capsule 0  . lidocaine (XYLOCAINE) 2 % solution     .  magic mouthwash w/lidocaine SOLN Take 5 mLs by mouth 4 (four) times daily. 450 mL 0  . magnesium oxide (MAG-OX) 400 (241.3 Mg) MG tablet Take 1 tablet (400 mg total) by mouth 2 (two) times daily. 60 tablet 0  . Magnesium Oxide 400 (240 Mg) MG TABS Take 1 tablet by mouth 2 (two) times daily.    . meclizine (ANTIVERT) 25 MG tablet Take 25 mg by mouth every 6 (six) hours as needed for dizziness.     . metFORMIN (GLUCOPHAGE) 1000 MG tablet TAKE ONE TABLET BY MOUTH TWICE A DAY 60 tablet 3  . metoprolol tartrate (LOPRESSOR) 25 MG tablet Take 0.5 tablets (12.5 mg total) by mouth 2 (two) times daily. 90 tablet 3  . omeprazole (PRILOSEC) 40 MG capsule TAKE ONE CAPSULE BY MOUTH DAILY 90 capsule 3  . potassium chloride SA (KLOR-CON) 20 MEQ tablet Take 1 tablet (20 mEq total) by mouth 2 (two) times daily. 10 tablet 0  . prochlorperazine (COMPAZINE) 10 MG tablet Take 1 tablet (10 mg total) by mouth every 6 (six) hours as needed (Nausea or vomiting). 30 tablet 1  . simvastatin (ZOCOR) 40 MG tablet TAKE ONE TABLET (40MG TOTAL) BY MOUTH BEDTIME 90 tablet 3  . SYNTHROID 137 MCG tablet Take 137 mcg by mouth every morning.    . tolterodine (DETROL LA) 4 MG 24 hr capsule TAKE ONE CAPSULE BY MOUTH DAILY (Patient taking differently: Take 4 mg by mouth daily.) 90 capsule 3  . traMADol (ULTRAM) 50 MG tablet Take 1 tablet (50  mg total) by mouth 2 (two) times daily as needed. 60 tablet 0   No current facility-administered medications for this visit.    ALLERGIES:  No Known Allergies  PHYSICAL EXAM:  Performance status (ECOG): 3 - Symptomatic, >50% confined to bed  Vitals:   04/23/20 1302  BP: (!) 150/59  Pulse: 82  Resp: 18  Temp: 98.7 F (37.1 C)  SpO2: 98%   Wt Readings from Last 3 Encounters:  04/23/20 183 lb 1.6 oz (83.1 kg)  04/16/20 180 lb (81.6 kg)  04/02/20 181 lb 6.4 oz (82.3 kg)   Physical Exam Constitutional:      Comments: In wheelchair  HENT:     Mouth/Throat:     Dentition: Abnormal dentition (only 5 native teeth on lower jaw).     LABORATORY DATA:  I have reviewed the labs as listed.  CBC Latest Ref Rng & Units 04/23/2020 04/16/2020 04/09/2020  WBC 4.0 - 10.5 K/uL 9.0 10.2 10.1  Hemoglobin 12.0 - 15.0 g/dL 11.4(L) 11.1(L) 11.2(L)  Hematocrit 36.0 - 46.0 % 37.0 36.6 36.6  Platelets 150 - 400 K/uL 253 194 156   CMP Latest Ref Rng & Units 04/23/2020 04/16/2020 04/09/2020  Glucose 70 - 99 mg/dL 387(H) 235(H) 252(H)  BUN 8 - 23 mg/dL 10 13 15   Creatinine 0.44 - 1.00 mg/dL 0.82 0.94 0.86  Sodium 135 - 145 mmol/L 137 139 140  Potassium 3.5 - 5.1 mmol/L 3.6 3.7 3.9  Chloride 98 - 111 mmol/L 98 102 102  CO2 22 - 32 mmol/L 28 27 28   Calcium 8.9 - 10.3 mg/dL 8.5(L) 8.9 8.7(L)  Total Protein 6.5 - 8.1 g/dL 6.3(L) 6.5 6.6  Total Bilirubin 0.3 - 1.2 mg/dL 1.0 1.2 1.4(H)  Alkaline Phos 38 - 126 U/L 73 75 72  AST 15 - 41 U/L 20 23 12(L)  ALT 0 - 44 U/L 29 36 9    DIAGNOSTIC IMAGING:  I have independently reviewed the  scans and discussed with the patient. DG Chest 2 View  Result Date: 03/27/2020 CLINICAL DATA:  Chest congestion with pedal edema and crackles on exam EXAM: CHEST - 2 VIEW COMPARISON:  04/25/2019 chest radiograph. FINDINGS: Stable cardiomediastinal silhouette with normal heart size. No pneumothorax. No pleural effusion. No pulmonary edema. No acute consolidative airspace  disease. Suggestion fine patchy reticular opacities in the peripheral basilar lungs, possibly slightly increased. IMPRESSION: No acute consolidative airspace disease. Suggestion of fine patchy reticular opacities in the peripheral basilar lungs, possibly slightly increased, cannot exclude mild fibrosis. High-resolution chest CT may be obtained for further evaluation as clinically warranted. Electronically Signed   By: Ilona Sorrel M.D.   On: 03/27/2020 16:59   CT Head Wo Contrast  Result Date: 03/26/2020 CLINICAL DATA:  Altered mental status, syncope EXAM: CT HEAD WITHOUT CONTRAST TECHNIQUE: Contiguous axial images were obtained from the base of the skull through the vertex without intravenous contrast. COMPARISON:  None. FINDINGS: Brain: There is atrophy and chronic small vessel disease changes. No acute intracranial abnormality. Specifically, no hemorrhage, hydrocephalus, mass lesion, acute infarction, or significant intracranial injury. Vascular: No hyperdense vessel or unexpected calcification. Skull: No acute calvarial abnormality. Sinuses/Orbits: Visualized paranasal sinuses and mastoids clear. Orbital soft tissues unremarkable. Other: None IMPRESSION: Atrophy, chronic microvascular disease. No acute intracranial abnormality. Electronically Signed   By: Rolm Baptise M.D.   On: 03/26/2020 09:34     ASSESSMENT:  1. Normocytic anemia: -Patient seen at the request of Dr. Buelah Manis for further work-up and management of normocytic anemia. -Recent CBC on 08/21/2019 shows hemoglobin 6.9 with MCV of 88.3. White count and platelets are normal. No history of CKD. Denies any bleeding per rectum or melena. Last colonoscopy on 04/01/2014 shows diffusely pigmented rectal mucosa consistent with melanosis [place] Mills otherwise normal mucosa. Diffusely pigmented colonic mucosa, 2 diminutive polyps in the mid ascending segment, otherwise the remainder of the colon mucosa was normal. Biopsy consistent with tubular  adenoma. -CT renal study on 05/11/2019 shows normal-sized spleen with normal liver. No other abnormal adenopathy. -Denies any prior history of blood transfusion. She is currently taking iron tablet twice daily.  2. IgG lambda plasma cell myeloma: -Skeletal survey on 09/12/2019 shows diffuse faint lucencies in the skull, bilateral femurs. -Bone marrow biopsy on 09/27/2019 shows 70% plasma cells in hypercellular marrow. Chromosome analysis was 45, XX.FISH panel was normal. However quality of specimen is compromised. -LDH normal, beta-2 microglobulin 2.9. M spike was 2.1 g. Kappa light chains 19.7, lambda light chains 14.3 with ratio of 1.38. -24-hour urine shows nonnephrotic range proteinuria. Urine immunofixation was positive.   PLAN:  1.IgG lambda plasma cell myeloma: -She is tolerating Revlimid and Velcade very well.  Revlimid is 15 mg 2 weeks on/1 week off. -She is taking dexamethasone 20 mg weekly. -Reviewed myeloma panel from 03/26/2020.  M spike is not observed.  Immunofixation was normal.  Free kappa light chains are slightly elevated but ratio is normal. -As her last 2-3 myeloma tests are negative, will consider discontinuing Velcade at this time.  She will receive her last dose of Velcade on 04/30/2020. -After that she will continue Revlimid 15 mg 2 weeks on 1 week off.  We will also discontinue dexamethasone as her sugars are being high. -RTC 4 weeks for follow-up.  We will plan to repeat myeloma labs on 04/30/2020. -We talked to her about initiating her on denosumab.  With Dr. Was side effects in detail.  She had for teeth in the lower jaw.  No teeth  on the upper jaw.  She was encouraged to follow-up with dentist.  2. Diabetes: -Continue glipizide and Metformin. -Her blood sugar today is elevated at 380.  She did not take her medication.  Will give her insulin 10 units subcu. -We will decrease dexamethasone to 10 mg today and next week.  She will not receive any dexamethasone  after that.  3. Hypertension: -Continue amlodipine and HCTZ.  4. Hypothyroidism: -Continue Synthroid daily.  5. B12 deficiency: -Continue monthly B12 injections.  6.Left knee pain: -Continue tramadol 50 mg every 12 hours as needed.  7. Infection prophylaxis: -Continue acyclovir 400 mg twice daily and aspirin 81 mg daily.  8.  Syncopal episodes: -She previously had 2 episodes of syncope. -She denied any syncope episodes since last visit 1 month ago.   Orders placed this encounter:  Orders Placed This Encounter  Procedures  . Immunofixation electrophoresis  . Protein electrophoresis, serum  . Kappa/lambda light chains     Derek Jack, MD [place] [phone]   I, Milinda Antis, am acting as a scribe for Dr. Sanda Linger.  I, Derek Jack MD, have reviewed the above documentation for accuracy and completeness, and I agree with the above.

## 2020-04-24 ENCOUNTER — Other Ambulatory Visit: Payer: Self-pay | Admitting: Family Medicine

## 2020-04-28 LAB — PROTEIN ELECTROPHORESIS, SERUM
A/G Ratio: 1.2 (ref 0.7–1.7)
Albumin ELP: 3.3 g/dL (ref 2.9–4.4)
Alpha-1-Globulin: 0.2 g/dL (ref 0.0–0.4)
Alpha-2-Globulin: 0.8 g/dL (ref 0.4–1.0)
Beta Globulin: 1.2 g/dL (ref 0.7–1.3)
Gamma Globulin: 0.5 g/dL (ref 0.4–1.8)
Globulin, Total: 2.8 g/dL (ref 2.2–3.9)
Total Protein ELP: 6.1 g/dL (ref 6.0–8.5)

## 2020-05-01 ENCOUNTER — Other Ambulatory Visit: Payer: Self-pay | Admitting: Family Medicine

## 2020-05-02 ENCOUNTER — Inpatient Hospital Stay (HOSPITAL_COMMUNITY): Payer: Medicare Other

## 2020-05-02 ENCOUNTER — Encounter (HOSPITAL_COMMUNITY): Payer: Self-pay

## 2020-05-02 ENCOUNTER — Other Ambulatory Visit: Payer: Self-pay

## 2020-05-02 VITALS — BP 141/57 | HR 58 | Temp 97.2°F | Resp 18 | Wt 176.8 lb

## 2020-05-02 DIAGNOSIS — E119 Type 2 diabetes mellitus without complications: Secondary | ICD-10-CM | POA: Diagnosis not present

## 2020-05-02 DIAGNOSIS — C9000 Multiple myeloma not having achieved remission: Secondary | ICD-10-CM | POA: Diagnosis not present

## 2020-05-02 DIAGNOSIS — Z5112 Encounter for antineoplastic immunotherapy: Secondary | ICD-10-CM | POA: Diagnosis not present

## 2020-05-02 DIAGNOSIS — C9001 Multiple myeloma in remission: Secondary | ICD-10-CM

## 2020-05-02 DIAGNOSIS — Z7984 Long term (current) use of oral hypoglycemic drugs: Secondary | ICD-10-CM | POA: Diagnosis not present

## 2020-05-02 DIAGNOSIS — D649 Anemia, unspecified: Secondary | ICD-10-CM

## 2020-05-02 LAB — CBC WITH DIFFERENTIAL/PLATELET
Abs Immature Granulocytes: 0.06 10*3/uL (ref 0.00–0.07)
Basophils Absolute: 0.1 10*3/uL (ref 0.0–0.1)
Basophils Relative: 1 %
Eosinophils Absolute: 0.7 10*3/uL — ABNORMAL HIGH (ref 0.0–0.5)
Eosinophils Relative: 6 %
HCT: 35.2 % — ABNORMAL LOW (ref 36.0–46.0)
Hemoglobin: 10.7 g/dL — ABNORMAL LOW (ref 12.0–15.0)
Immature Granulocytes: 1 %
Lymphocytes Relative: 27 %
Lymphs Abs: 3 10*3/uL (ref 0.7–4.0)
MCH: 29.4 pg (ref 26.0–34.0)
MCHC: 30.4 g/dL (ref 30.0–36.0)
MCV: 96.7 fL (ref 80.0–100.0)
Monocytes Absolute: 1.1 10*3/uL — ABNORMAL HIGH (ref 0.1–1.0)
Monocytes Relative: 10 %
Neutro Abs: 6 10*3/uL (ref 1.7–7.7)
Neutrophils Relative %: 55 %
Platelets: 189 10*3/uL (ref 150–400)
RBC: 3.64 MIL/uL — ABNORMAL LOW (ref 3.87–5.11)
RDW: 19.1 % — ABNORMAL HIGH (ref 11.5–15.5)
WBC: 10.8 10*3/uL — ABNORMAL HIGH (ref 4.0–10.5)
nRBC: 0 % (ref 0.0–0.2)

## 2020-05-02 LAB — COMPREHENSIVE METABOLIC PANEL
ALT: 13 U/L (ref 0–44)
AST: 15 U/L (ref 15–41)
Albumin: 3.5 g/dL (ref 3.5–5.0)
Alkaline Phosphatase: 61 U/L (ref 38–126)
Anion gap: 13 (ref 5–15)
BUN: 9 mg/dL (ref 8–23)
CO2: 23 mmol/L (ref 22–32)
Calcium: 7.5 mg/dL — ABNORMAL LOW (ref 8.9–10.3)
Chloride: 108 mmol/L (ref 98–111)
Creatinine, Ser: 0.74 mg/dL (ref 0.44–1.00)
GFR, Estimated: >60 mL/min (ref >60)
Glucose, Bld: 176 mg/dL — ABNORMAL HIGH (ref 70–99)
Potassium: 3.4 mmol/L — ABNORMAL LOW (ref 3.5–5.1)
Sodium: 144 mmol/L (ref 135–145)
Total Bilirubin: 0.8 mg/dL (ref 0.3–1.2)
Total Protein: 6.6 g/dL (ref 6.5–8.1)

## 2020-05-02 LAB — MAGNESIUM: Magnesium: 1.8 mg/dL (ref 1.7–2.4)

## 2020-05-02 MED ORDER — DEXAMETHASONE 4 MG PO TABS
20.0000 mg | ORAL_TABLET | Freq: Once | ORAL | Status: AC
  Administered 2020-05-02: 20 mg via ORAL

## 2020-05-02 MED ORDER — DEXAMETHASONE 4 MG PO TABS
ORAL_TABLET | ORAL | Status: AC
  Filled 2020-05-02: qty 5

## 2020-05-02 MED ORDER — BORTEZOMIB CHEMO SQ INJECTION 3.5 MG (2.5MG/ML)
1.3000 mg/m2 | Freq: Once | INTRAMUSCULAR | Status: AC
  Administered 2020-05-02: 2.25 mg via SUBCUTANEOUS
  Filled 2020-05-02: qty 0.9

## 2020-05-02 NOTE — Progress Notes (Signed)
Patient is taking Revlimid and has not missed any doses and reports no side effects at this time.   Kim Mills presents today for injection per the provider's orders.  Velcade administration without incident; injection site WNL; see MAR for injection details.  Patient tolerated procedure well and without incident.  No questions or complaints noted at this time.  Discharged via wheelchair in c/o family in stable condition.

## 2020-05-05 LAB — KAPPA/LAMBDA LIGHT CHAINS
Kappa free light chain: 28.9 mg/L — ABNORMAL HIGH (ref 3.3–19.4)
Kappa, lambda light chain ratio: 1.67 — ABNORMAL HIGH (ref 0.26–1.65)
Lambda free light chains: 17.3 mg/L (ref 5.7–26.3)

## 2020-05-05 LAB — IMMUNOFIXATION ELECTROPHORESIS
IgA: 285 mg/dL (ref 64–422)
IgG (Immunoglobin G), Serum: 640 mg/dL (ref 586–1602)
IgM (Immunoglobulin M), Srm: 19 mg/dL — ABNORMAL LOW (ref 26–217)
Total Protein ELP: 5.8 g/dL — ABNORMAL LOW (ref 6.0–8.5)

## 2020-05-06 ENCOUNTER — Ambulatory Visit: Payer: Medicare Other | Admitting: Internal Medicine

## 2020-05-06 ENCOUNTER — Other Ambulatory Visit: Payer: Self-pay | Admitting: Internal Medicine

## 2020-05-06 ENCOUNTER — Other Ambulatory Visit: Payer: Self-pay | Admitting: Family Medicine

## 2020-05-06 LAB — PROTEIN ELECTROPHORESIS, SERUM
A/G Ratio: 1.3 (ref 0.7–1.7)
Albumin ELP: 3.5 g/dL (ref 2.9–4.4)
Alpha-1-Globulin: 0.3 g/dL (ref 0.0–0.4)
Alpha-2-Globulin: 0.7 g/dL (ref 0.4–1.0)
Beta Globulin: 1 g/dL (ref 0.7–1.3)
Gamma Globulin: 0.7 g/dL (ref 0.4–1.8)
Globulin, Total: 2.6 g/dL (ref 2.2–3.9)
M-Spike, %: 0.1 g/dL — ABNORMAL HIGH
Total Protein ELP: 6.1 g/dL (ref 6.0–8.5)

## 2020-05-13 ENCOUNTER — Encounter: Payer: Self-pay | Admitting: Internal Medicine

## 2020-05-13 ENCOUNTER — Other Ambulatory Visit: Payer: Self-pay

## 2020-05-13 ENCOUNTER — Ambulatory Visit (INDEPENDENT_AMBULATORY_CARE_PROVIDER_SITE_OTHER): Payer: Medicare Other | Admitting: Internal Medicine

## 2020-05-13 VITALS — BP 176/76 | HR 78 | Resp 18 | Ht 64.0 in

## 2020-05-13 DIAGNOSIS — Z1159 Encounter for screening for other viral diseases: Secondary | ICD-10-CM

## 2020-05-13 DIAGNOSIS — Z7689 Persons encountering health services in other specified circumstances: Secondary | ICD-10-CM | POA: Diagnosis not present

## 2020-05-13 DIAGNOSIS — E039 Hypothyroidism, unspecified: Secondary | ICD-10-CM | POA: Diagnosis not present

## 2020-05-13 DIAGNOSIS — I10 Essential (primary) hypertension: Secondary | ICD-10-CM | POA: Diagnosis not present

## 2020-05-13 DIAGNOSIS — C9000 Multiple myeloma not having achieved remission: Secondary | ICD-10-CM

## 2020-05-13 DIAGNOSIS — I2510 Atherosclerotic heart disease of native coronary artery without angina pectoris: Secondary | ICD-10-CM | POA: Diagnosis not present

## 2020-05-13 DIAGNOSIS — E1143 Type 2 diabetes mellitus with diabetic autonomic (poly)neuropathy: Secondary | ICD-10-CM

## 2020-05-13 DIAGNOSIS — K219 Gastro-esophageal reflux disease without esophagitis: Secondary | ICD-10-CM | POA: Diagnosis not present

## 2020-05-13 DIAGNOSIS — N3281 Overactive bladder: Secondary | ICD-10-CM

## 2020-05-13 DIAGNOSIS — E782 Mixed hyperlipidemia: Secondary | ICD-10-CM

## 2020-05-13 DIAGNOSIS — N1832 Chronic kidney disease, stage 3b: Secondary | ICD-10-CM

## 2020-05-13 NOTE — Assessment & Plan Note (Addendum)
On Omeprazole 

## 2020-05-13 NOTE — Assessment & Plan Note (Signed)
Not in remission On Revlimid Follows up with Oncology

## 2020-05-13 NOTE — Assessment & Plan Note (Signed)
On statin.

## 2020-05-13 NOTE — Progress Notes (Signed)
New Patient Office Visit  Subjective:  Patient ID: Kim Mills, female    DOB: 1940/10/08  Age: 80 y.o. MRN: [id number]  CC:  Chief Complaint  Patient presents with  . New Patient (Initial Visit)    New patient just establishing care     HPI CAMBREY LUPI is a 80 year old female with PMH of multiple myeloma, HTN, CAD, DM with neuropathy, CKD stage 3, hypothyroidism, overactive bladder and chronic pain who presents for establishing care. She is a former patient of Dr Buelah Manis. Her daughter is present during the visit.  She has been following up with Dr Delton Coombes for multiple myeloma. She is on Revlimid currently. She has not achieved remission yet. She is on Norco for pain control.  Her BP was elevated today. She denies any headache, dizziness, chest pain, dyspnea or palpitations. She takes medications by herself, but it appears that she might not be compliant.  She takes Metformin and Glipizide for DM. She takes Gabapentin for DM neuropathy.  She has had 2 doses of COVID vaccine.  Past Medical History:  Diagnosis Date  . Arthritis   . Cancer ([place])   . Cataract   . Chest pain    Associated with weakness and fatigue  . Diabetes mellitus    A1c of 7.4 in 08/2010  . GERD (gastroesophageal reflux disease)   . Glaucoma   . Hyperlipidemia    Lipid profile in 08/2010:190, 121, 48, 118; normal CBC and CMet  . Hypertension    Lipid profile in 08/2010:190, 121, 48, 118; normal CBC and CMet  . Hypothyroidism   . Hypothyroidism   . Obesity     Past Surgical History:  Procedure Laterality Date  . CHOLECYSTECTOMY    . COLONOSCOPY N/A 04/01/2014   RMR: Melanosis coli. colonic polyps removed as described above.   . CYSTOSCOPY W/ URETERAL STENT PLACEMENT Right 09/13/2019   Procedure: CYSTOSCOPY WITH RIGHT  RETROGRADE PYELOGRAM; BLADDER BIOPSY;  Surgeon: Cleon Gustin, MD;  Location: [facility];  Service: Urology;  Laterality: Right;  . LEFT HEART CATH AND CORONARY ANGIOGRAPHY  N/A 10/07/2017   Procedure: LEFT HEART CATH AND CORONARY ANGIOGRAPHY;  Surgeon: Troy Sine, MD;  Location: [facility];  Service: Cardiovascular;  Laterality: N/A;  . THYROIDECTOMY, PARTIAL      Family History  Problem Relation Age of Onset  . Anemia Father   . Arthritis Mother   . Stroke Brother   . Cancer Sister        unknown kind  . Diabetes Sister   . Dementia Sister   . Cancer Niece        Breast  . Cancer Brother   . Healthy Son   . Healthy Son   . Healthy Son   . Healthy Daughter   . Healthy Daughter   . Healthy Daughter   . Sarcoidosis Daughter     Social History   Socioeconomic History  . Marital status: Widowed    Spouse name: Not on file  . Number of children: 7  . Years of education: Not on file  . Highest education level: Not on file  Occupational History    Employer: RETIRED  Tobacco Use  . Smoking status: Passive Smoke Exposure - Never Smoker  . Smokeless tobacco: Never Used  Vaping Use  . Vaping Use: Never used  Substance and Sexual Activity  . Alcohol use: Never  . Drug use: Never  . Sexual activity: Not Currently  Other Topics  Concern  . Not on file  Social History Narrative  . Not on file   Social Determinants of Health   Financial Resource Strain: Not on file  Food Insecurity: Not on file  Transportation Needs: No Transportation Needs  . Lack of Transportation (Medical): No  . Lack of Transportation (Non-Medical): No  Physical Activity: Inactive  . Days of Exercise per Week: 0 days  . Minutes of Exercise per Session: 0 min  Stress: Not on file  Social Connections: Not on file  Intimate Partner Violence: Not At Risk  . Fear of Current or Ex-Partner: No  . Emotionally Abused: No  . Physically Abused: No  . Sexually Abused: No    ROS Review of Systems  Constitutional: Positive for fatigue. Negative for chills and fever.  HENT: Negative for congestion, sinus pressure, sinus pain and sore throat.   Eyes: Negative for  pain and discharge.  Respiratory: Negative for cough and shortness of breath.   Cardiovascular: Negative for chest pain and palpitations.  Gastrointestinal: Negative for abdominal pain, constipation, diarrhea, nausea and vomiting.  Endocrine: Negative for polydipsia and polyuria.  Genitourinary: Negative for dysuria and hematuria.  Musculoskeletal: Positive for arthralgias and back pain. Negative for neck pain and neck stiffness.  Skin: Negative for rash.  Neurological: Negative for dizziness and weakness.  Psychiatric/Behavioral: Negative for agitation and behavioral problems.    Objective:   Today's Vitals: BP (!) 176/76 (BP Location: Right Arm, Patient Position: Sitting, Cuff Size: Normal)   Pulse 78   Resp 18   Ht 5' 4"  (1.626 m)   SpO2 100%   BMI 30.35 kg/m   Physical Exam Vitals reviewed.  Constitutional:      General: She is not in acute distress.    Appearance: She is not diaphoretic.     Comments: In wheelchair  HENT:     Head: Normocephalic and atraumatic.     Nose: Nose normal.     Mouth/Throat:     Mouth: Mucous membranes are moist.  Eyes:     General: No scleral icterus.    Extraocular Movements: Extraocular movements intact.  Cardiovascular:     Rate and Rhythm: Normal rate and regular rhythm.     Pulses: Normal pulses.     Heart sounds: Normal heart sounds. No murmur heard.   Pulmonary:     Breath sounds: Normal breath sounds. No wheezing or rales.  Abdominal:     Palpations: Abdomen is soft.     Tenderness: There is no abdominal tenderness.  Musculoskeletal:     Cervical back: Neck supple. No tenderness.     Right lower leg: No edema.     Left lower leg: No edema.  Skin:    General: Skin is warm.     Findings: No rash.  Neurological:     General: No focal deficit present.     Mental Status: She is alert and oriented to person, place, and time.  Psychiatric:        Mood and Affect: Mood normal.        Behavior: Behavior normal.      Assessment & Plan:   Problem List Items Addressed This Visit      Encounter to establish care    -  Primary  Care established History and medications reviewed with the patient  Cardiovascular and Mediastinum   Hypertension    BP Readings from Last 1 Encounters:  05/13/20 (!) 176/76   uncontrolled with Amlodipine, Metoprolol and HCTZ Increased Metoprolol  to 25 mg BID Counseled for compliance with the medications Advised low salt diet      CAD (coronary artery disease)    On Aspirin and statin        Digestive   GERD (gastroesophageal reflux disease)    On Omeprazole        Endocrine   Type 2 diabetes mellitus (HCC)    Lab Results  Component Value Date   HGBA1C 6.9 (H) 08/17/2019   On Metformin and Glipizide      Hypothyroidism    On Synthroid 137 mcg QD      Diabetic neuropathy (HCC)    On Gabapentin        Genitourinary   CKD (chronic kidney disease) stage 3, GFR 30-59 ml/min (HCC)    Has underlying MM Gets routine BMP check during Oncology visits Avoid nephrotoxic agents      Overactive bladder    On Tolterodine 4 mg QD        Other   Hyperlipidemia    On statin      Multiple myeloma ([place])    Not in remission On Revlimid Follows up with Oncology       Other Visit Diagnoses       Need for hepatitis C screening test       Relevant Orders   Hepatitis C Antibody      Outpatient Encounter Medications as of 05/13/2020  Medication Sig  . acyclovir (ZOVIRAX) 400 MG tablet Take 1 tablet (400 mg total) by mouth 2 (two) times daily.  Marland Kitchen amLODipine (NORVASC) 10 MG tablet Take 1 tablet (10 mg total) by mouth daily.  . ASPIRIN 81 PO Take 1 tablet by mouth daily.  . Blood Glucose Monitoring Suppl (BLOOD GLUCOSE SYSTEM PAK) KIT Please dispense based on patient and insurance preference. Use as directed to monitor FSBS 2x daily. Dx: E11.9.  Marland Kitchen fluticasone (FLONASE) 50 MCG/ACT nasal spray Place 2 sprays into both nostrils daily.  Marland Kitchen gabapentin  (NEURONTIN) 300 MG capsule TAKE ONE CAPSULE BY MOUTH EVERY NIGHT AT BEDTIME  . glipiZIDE (GLUCOTROL) 5 MG tablet TAKE ONE TABLET (5MG TOTAL) BY MOUTH TWOTIMES DAILY  . Glucose Blood (BLOOD GLUCOSE TEST STRIPS) STRP Please dispense based on patient and insurance preference. Use as directed to monitor FSBS 2x daily. Dx: E11.9.  . HM LORATADINE 10 MG tablet TAKE ONE TABLET (10MG TOTAL) BY MOUTH DAILY  . hydrochlorothiazide (MICROZIDE) 12.5 MG capsule TAKE ONE CAPSULE BY MOUTH DAILY (Patient taking differently: Take 12.5 mg by mouth daily.)  . HYDROcodone-acetaminophen (NORCO/VICODIN) 5-325 MG tablet Take 1 tablet by mouth every 6 (six) hours as needed for moderate pain.  . Lancets MISC Please dispense based on patient and insurance preference. Use as directed to monitor FSBS 2x daily. Dx: E11.9.  . lenalidomide (REVLIMID) 15 MG capsule Take 1 capsule (15 mg total) by mouth daily.  Marland Kitchen lidocaine (XYLOCAINE) 2 % solution   . magic mouthwash w/lidocaine SOLN Take 5 mLs by mouth 4 (four) times daily.  . magnesium oxide (MAG-OX) 400 (241.3 Mg) MG tablet Take 1 tablet (400 mg total) by mouth 2 (two) times daily.  . meclizine (ANTIVERT) 25 MG tablet Take 25 mg by mouth every 6 (six) hours as needed for dizziness.   . metFORMIN (GLUCOPHAGE) 1000 MG tablet TAKE ONE TABLET BY MOUTH TWICE A DAY  . metoprolol tartrate (LOPRESSOR) 25 MG tablet Take 0.5 tablets (12.5 mg total) by mouth 2 (two) times daily. (Patient taking differently: Take 25 mg  by mouth 2 (two) times daily.)  . omeprazole (PRILOSEC) 40 MG capsule TAKE ONE CAPSULE BY MOUTH DAILY  . potassium chloride SA (KLOR-CON) 20 MEQ tablet Take 1 tablet (20 mEq total) by mouth 2 (two) times daily.  . prochlorperazine (COMPAZINE) 10 MG tablet Take 1 tablet (10 mg total) by mouth every 6 (six) hours as needed (Nausea or vomiting).  . simvastatin (ZOCOR) 40 MG tablet TAKE ONE TABLET (40MG TOTAL) BY MOUTH BEDTIME  . SYNTHROID 137 MCG tablet TAKE ONE TABLET (137MCG  TOTAL) BY MOUTH DAILY BEFORE BREAKFAST  . tolterodine (DETROL LA) 4 MG 24 hr capsule TAKE ONE CAPSULE BY MOUTH DAILY (Patient taking differently: Take 4 mg by mouth daily.)  . traMADol (ULTRAM) 50 MG tablet Take 1 tablet (50 mg total) by mouth 2 (two) times daily as needed.   No facility-administered encounter medications on file as of 05/13/2020.    Follow-up: Return in about 6 weeks (around 06/24/2020) for HTN and medication review.   Lindell Spar, MD

## 2020-05-13 NOTE — Assessment & Plan Note (Signed)
BP Readings from Last 1 Encounters:  05/13/20 (!) 176/76   uncontrolled with Amlodipine, Metoprolol and HCTZ Increased Metoprolol to 25 mg BID Counseled for compliance with the medications Advised low salt diet

## 2020-05-13 NOTE — Assessment & Plan Note (Signed)
On Synthroid 137 mcg QD

## 2020-05-13 NOTE — Patient Instructions (Addendum)
Please start taking Metoprolol 25 mg mg twice daily instead of 12.5 mg.  Continue taking other medications as prescribed.  Please follow low salt diet.  Please bring your medications in the next visit.

## 2020-05-13 NOTE — Assessment & Plan Note (Signed)
Has underlying MM Gets routine BMP check during Oncology visits Avoid nephrotoxic agents

## 2020-05-13 NOTE — Assessment & Plan Note (Signed)
Lab Results  Component Value Date   HGBA1C 6.9 (H) 08/17/2019   On Metformin and Glipizide

## 2020-05-13 NOTE — Assessment & Plan Note (Signed)
On Tolterodine 4 mg QD

## 2020-05-13 NOTE — Assessment & Plan Note (Signed)
On Aspirin and statin

## 2020-05-13 NOTE — Assessment & Plan Note (Signed)
On Gabapentin 

## 2020-05-15 ENCOUNTER — Other Ambulatory Visit (HOSPITAL_COMMUNITY): Payer: Self-pay

## 2020-05-15 MED ORDER — LENALIDOMIDE 15 MG PO CAPS: 15.0000 mg | ORAL_CAPSULE | Freq: Every day | ORAL | 0 refills | Status: DC

## 2020-05-15 NOTE — Telephone Encounter (Signed)
Chart reviewed. Revlimid refilled per Dr. Delton Coombes

## 2020-05-26 ENCOUNTER — Other Ambulatory Visit: Payer: Self-pay

## 2020-05-26 ENCOUNTER — Inpatient Hospital Stay (HOSPITAL_COMMUNITY): Payer: Medicare Other

## 2020-05-26 ENCOUNTER — Inpatient Hospital Stay (HOSPITAL_BASED_OUTPATIENT_CLINIC_OR_DEPARTMENT_OTHER): Payer: Medicare Other | Admitting: Hematology

## 2020-05-26 ENCOUNTER — Inpatient Hospital Stay (HOSPITAL_COMMUNITY): Payer: Medicare Other | Attending: Hematology

## 2020-05-26 ENCOUNTER — Other Ambulatory Visit (HOSPITAL_COMMUNITY): Payer: Self-pay | Admitting: Hematology

## 2020-05-26 ENCOUNTER — Encounter (HOSPITAL_COMMUNITY): Payer: Self-pay

## 2020-05-26 ENCOUNTER — Ambulatory Visit (HOSPITAL_COMMUNITY)
Admission: RE | Admit: 2020-05-26 | Discharge: 2020-05-26 | Disposition: A | Discharge status: Home / Self Care | Payer: Medicare Other | Source: Ambulatory Visit | Attending: Hematology | Admitting: Hematology

## 2020-05-26 VITALS — BP 148/49 | HR 52 | Temp 96.9°F | Resp 18 | Wt 184.9 lb

## 2020-05-26 DIAGNOSIS — C9001 Multiple myeloma in remission: Secondary | ICD-10-CM

## 2020-05-26 DIAGNOSIS — M1612 Unilateral primary osteoarthritis, left hip: Secondary | ICD-10-CM | POA: Diagnosis not present

## 2020-05-26 DIAGNOSIS — C9000 Multiple myeloma not having achieved remission: Secondary | ICD-10-CM

## 2020-05-26 DIAGNOSIS — D649 Anemia, unspecified: Secondary | ICD-10-CM

## 2020-05-26 DIAGNOSIS — M160 Bilateral primary osteoarthritis of hip: Secondary | ICD-10-CM | POA: Diagnosis not present

## 2020-05-26 DIAGNOSIS — R102 Pelvic and perineal pain: Secondary | ICD-10-CM | POA: Diagnosis not present

## 2020-05-26 DIAGNOSIS — M85851 Other specified disorders of bone density and structure, right thigh: Secondary | ICD-10-CM | POA: Diagnosis not present

## 2020-05-26 DIAGNOSIS — M1611 Unilateral primary osteoarthritis, right hip: Secondary | ICD-10-CM | POA: Diagnosis not present

## 2020-05-26 DIAGNOSIS — M47816 Spondylosis without myelopathy or radiculopathy, lumbar region: Secondary | ICD-10-CM | POA: Diagnosis not present

## 2020-05-26 LAB — CBC WITH DIFFERENTIAL/PLATELET
Abs Immature Granulocytes: 0.03 10*3/uL (ref 0.00–0.07)
Basophils Absolute: 0.2 10*3/uL — ABNORMAL HIGH (ref 0.0–0.1)
Basophils Relative: 2 %
Eosinophils Absolute: 0.4 10*3/uL (ref 0.0–0.5)
Eosinophils Relative: 6 %
HCT: 36.7 % (ref 36.0–46.0)
Hemoglobin: 11.3 g/dL — ABNORMAL LOW (ref 12.0–15.0)
Immature Granulocytes: 0 %
Lymphocytes Relative: 44 %
Lymphs Abs: 3.2 10*3/uL (ref 0.7–4.0)
MCH: 29.7 pg (ref 26.0–34.0)
MCHC: 30.8 g/dL (ref 30.0–36.0)
MCV: 96.6 fL (ref 80.0–100.0)
Monocytes Absolute: 0.9 10*3/uL (ref 0.1–1.0)
Monocytes Relative: 12 %
Neutro Abs: 2.7 10*3/uL (ref 1.7–7.7)
Neutrophils Relative %: 36 %
Platelets: 181 10*3/uL (ref 150–400)
RBC: 3.8 MIL/uL — ABNORMAL LOW (ref 3.87–5.11)
RDW: 17.8 % — ABNORMAL HIGH (ref 11.5–15.5)
WBC: 7.4 10*3/uL (ref 4.0–10.5)
nRBC: 0 % (ref 0.0–0.2)

## 2020-05-26 LAB — COMPREHENSIVE METABOLIC PANEL
ALT: 13 U/L (ref 0–44)
AST: 13 U/L — ABNORMAL LOW (ref 15–41)
Albumin: 3.6 g/dL (ref 3.5–5.0)
Alkaline Phosphatase: 59 U/L (ref 38–126)
Anion gap: 7 (ref 5–15)
BUN: 13 mg/dL (ref 8–23)
CO2: 25 mmol/L (ref 22–32)
Calcium: 7.8 mg/dL — ABNORMAL LOW (ref 8.9–10.3)
Chloride: 109 mmol/L (ref 98–111)
Creatinine, Ser: 0.72 mg/dL (ref 0.44–1.00)
GFR, Estimated: >60 mL/min (ref >60)
Glucose, Bld: 87 mg/dL (ref 70–99)
Potassium: 4.2 mmol/L (ref 3.5–5.1)
Sodium: 141 mmol/L (ref 135–145)
Total Bilirubin: 0.9 mg/dL (ref 0.3–1.2)
Total Protein: 6.7 g/dL (ref 6.5–8.1)

## 2020-05-26 LAB — LACTATE DEHYDROGENASE: LDH: 179 U/L (ref 98–192)

## 2020-05-26 MED ORDER — DENOSUMAB 120 MG/1.7ML ~~LOC~~ SOLN
120.0000 mg | Freq: Once | SUBCUTANEOUS | Status: AC
  Administered 2020-05-26: 120 mg via SUBCUTANEOUS

## 2020-05-26 NOTE — Progress Notes (Signed)
[place], [place] [address]   CLINIC:  Medical Oncology/Hematology  PCP:  Lindell Spar, MD [address] / [place] [address]  [phone]  REASON FOR VISIT:  Follow-up for multiple myeloma & normocytic anemia  PRIOR THERAPY: Velcade x 9 cycles from 10/29/2019 to 05/02/2020  CURRENT THERAPY: Revlimid 2/3 weeks  INTERVAL HISTORY:  [name], a 80 y.o. female, returns for routine follow-up for her multiple myeloma and normocytic anemia. Brooksie was last seen on 04/23/2020.  Today she is accompanied by her daughter and she reports feeling fair. She reports that since stopping Velcade, she has been having left hip and knee pain, which she has had previously due to arthritis. She is taking Revlimid 2 weeks on with 1 week off; she started a new bottle on 04/17. She has occasional constipation which then devolves into diarrhea and nausea when she takes her meds at night. She has not been having any more syncopal episodes. She is trying to stand more to walk with her walker; she is only able to walk 20 steps before getting SOB. She denies having any more cough or recent infections. She has stopped taking Decadron.   REVIEW OF SYSTEMS:  Review of Systems  Constitutional: Positive for appetite change (50%) and fatigue (25%).  Respiratory: Positive for shortness of breath (w/ minimal exertion). Negative for cough.   Gastrointestinal: Positive for constipation (occasional), diarrhea (after constipation) and nausea (when taking meds).  Musculoskeletal: Positive for arthralgias (bilat hip & knee arthritic pain).  Neurological: Positive for dizziness (in AM) and numbness (tingling in feet).  All other systems reviewed and are negative.   PAST MEDICAL/SURGICAL HISTORY:  Past Medical History:  Diagnosis Date  . Arthritis   . Cancer ([place])   . Cataract   . Chest pain    Associated with weakness and fatigue  . Diabetes mellitus    A1c  of 7.4 in 08/2010  . GERD (gastroesophageal reflux disease)   . Glaucoma   . Hyperlipidemia    Lipid profile in 08/2010:190, 121, 48, 118; normal CBC and CMet  . Hypertension    Lipid profile in 08/2010:190, 121, 48, 118; normal CBC and CMet  . Hypothyroidism   . Hypothyroidism   . Obesity    Past Surgical History:  Procedure Laterality Date  . CHOLECYSTECTOMY    . COLONOSCOPY N/A 04/01/2014   RMR: Melanosis coli. colonic polyps removed as described above.   . CYSTOSCOPY W/ URETERAL STENT PLACEMENT Right 09/13/2019   Procedure: CYSTOSCOPY WITH RIGHT  RETROGRADE PYELOGRAM; BLADDER BIOPSY;  Surgeon: Cleon Gustin, MD;  Location: [facility];  Service: Urology;  Laterality: Right;  . LEFT HEART CATH AND CORONARY ANGIOGRAPHY N/A 10/07/2017   Procedure: LEFT HEART CATH AND CORONARY ANGIOGRAPHY;  Surgeon: Troy Sine, MD;  Location: [facility];  Service: Cardiovascular;  Laterality: N/A;  . THYROIDECTOMY, PARTIAL      SOCIAL HISTORY:  Social History   Socioeconomic History  . Marital status: Widowed    Spouse name: Not on file  . Number of children: 7  . Years of education: Not on file  . Highest education level: Not on file  Occupational History    Employer: RETIRED  Tobacco Use  . Smoking status: Passive Smoke Exposure - Never Smoker  . Smokeless tobacco: Never Used  Vaping Use  . Vaping Use: Never used  Substance and Sexual Activity  . Alcohol use: Never  .  Drug use: Never  . Sexual activity: Not Currently  Other Topics Concern  . Not on file  Social History Narrative  . Not on file   Social Determinants of Health   Financial Resource Strain: Not on file  Food Insecurity: Not on file  Transportation Needs: No Transportation Needs  . Lack of Transportation (Medical): No  . Lack of Transportation (Non-Medical): No  Physical Activity: Inactive  . Days of Exercise per Week: 0 days  . Minutes of Exercise per Session: 0 min  Stress: Not on file  Social  Connections: Not on file  Intimate Partner Violence: Not At Risk  . Fear of Current or Ex-Partner: No  . Emotionally Abused: No  . Physically Abused: No  . Sexually Abused: No    FAMILY HISTORY:  Family History  Problem Relation Age of Onset  . Anemia Father   . Arthritis Mother   . Stroke Brother   . Cancer Sister        unknown kind  . Diabetes Sister   . Dementia Sister   . Cancer Niece        Breast  . Cancer Brother   . Healthy Son   . Healthy Son   . Healthy Son   . Healthy Daughter   . Healthy Daughter   . Healthy Daughter   . Sarcoidosis Daughter     CURRENT MEDICATIONS:  Current Outpatient Medications  Medication Sig Dispense Refill  . acyclovir (ZOVIRAX) 400 MG tablet Take 1 tablet (400 mg total) by mouth 2 (two) times daily. 60 tablet 6  . amLODipine (NORVASC) 10 MG tablet Take 1 tablet (10 mg total) by mouth daily. 90 tablet 3  . ASPIRIN 81 PO Take 1 tablet by mouth daily.    . Blood Glucose Monitoring Suppl (BLOOD GLUCOSE SYSTEM PAK) KIT Please dispense based on patient and insurance preference. Use as directed to monitor FSBS 2x daily. Dx: E11.9. 1 each 1  . fluticasone (FLONASE) 50 MCG/ACT nasal spray Place 2 sprays into both nostrils daily. 16 g 6  . gabapentin (NEURONTIN) 300 MG capsule TAKE ONE CAPSULE BY MOUTH EVERY NIGHT AT BEDTIME 90 capsule 3  . glipiZIDE (GLUCOTROL) 5 MG tablet TAKE ONE TABLET (5MG TOTAL) BY MOUTH TWOTIMES DAILY 180 tablet 3  . Glucose Blood (BLOOD GLUCOSE TEST STRIPS) STRP Please dispense based on patient and insurance preference. Use as directed to monitor FSBS 2x daily. Dx: E11.9. 100 each 11  . HM LORATADINE 10 MG tablet TAKE ONE TABLET (10MG TOTAL) BY MOUTH DAILY 30 tablet 0  . hydrochlorothiazide (MICROZIDE) 12.5 MG capsule TAKE ONE CAPSULE BY MOUTH DAILY (Patient taking differently: Take 12.5 mg by mouth daily.) 90 capsule 3  . HYDROcodone-acetaminophen (NORCO/VICODIN) 5-325 MG tablet Take 1 tablet by mouth every 6 (six) hours  as needed for moderate pain.    . Lancets MISC Please dispense based on patient and insurance preference. Use as directed to monitor FSBS 2x daily. Dx: E11.9. 100 each 11  . lenalidomide (REVLIMID) 15 MG capsule Take 1 capsule (15 mg total) by mouth daily. 14 capsule 0  . lidocaine (XYLOCAINE) 2 % solution     . magic mouthwash w/lidocaine SOLN Take 5 mLs by mouth 4 (four) times daily. 450 mL 0  . magnesium oxide (MAG-OX) 400 (241.3 Mg) MG tablet Take 1 tablet (400 mg total) by mouth 2 (two) times daily. 60 tablet 0  . meclizine (ANTIVERT) 25 MG tablet Take 25 mg by mouth every 6 (six)  hours as needed for dizziness.     . metFORMIN (GLUCOPHAGE) 1000 MG tablet TAKE ONE TABLET BY MOUTH TWICE A DAY 60 tablet 3  . metoprolol tartrate (LOPRESSOR) 25 MG tablet Take 0.5 tablets (12.5 mg total) by mouth 2 (two) times daily. (Patient taking differently: Take 25 mg by mouth 2 (two) times daily.) 90 tablet 3  . omeprazole (PRILOSEC) 40 MG capsule TAKE ONE CAPSULE BY MOUTH DAILY 90 capsule 3  . potassium chloride SA (KLOR-CON) 20 MEQ tablet Take 1 tablet (20 mEq total) by mouth 2 (two) times daily. 10 tablet 0  . prochlorperazine (COMPAZINE) 10 MG tablet Take 1 tablet (10 mg total) by mouth every 6 (six) hours as needed (Nausea or vomiting). 30 tablet 1  . simvastatin (ZOCOR) 40 MG tablet TAKE ONE TABLET (40MG TOTAL) BY MOUTH BEDTIME 90 tablet 3  . SYNTHROID 137 MCG tablet TAKE ONE TABLET (137MCG TOTAL) BY MOUTH DAILY BEFORE BREAKFAST 30 tablet 0  . tolterodine (DETROL LA) 4 MG 24 hr capsule TAKE ONE CAPSULE BY MOUTH DAILY (Patient taking differently: Take 4 mg by mouth daily.) 90 capsule 3  . traMADol (ULTRAM) 50 MG tablet Take 1 tablet (50 mg total) by mouth 2 (two) times daily as needed. 60 tablet 0   No current facility-administered medications for this visit.    ALLERGIES:  No Known Allergies  PHYSICAL EXAM:  Performance status (ECOG): 3 - Symptomatic, >50% confined to bed  Vitals:   05/26/20  1311  BP: (!) 148/49  Pulse: (!) 52  Resp: 18  Temp: (!) 96.9 F (36.1 C)  SpO2: 99%   Wt Readings from Last 3 Encounters:  05/26/20 184 lb 14.4 oz (83.9 kg)  05/02/20 176 lb 12.8 oz (80.2 kg)  04/23/20 183 lb 1.6 oz (83.1 kg)   Physical Exam Vitals reviewed.  Constitutional:      Appearance: Normal appearance.     Comments: In wheelchair  Cardiovascular:     Rate and Rhythm: Normal rate and regular rhythm.     Pulses: Normal pulses.     Heart sounds: Normal heart sounds.  Pulmonary:     Effort: Pulmonary effort is normal.     Breath sounds: Normal breath sounds.  Musculoskeletal:     Left upper leg: Tenderness and bony tenderness present.  Neurological:     General: No focal deficit present.     Mental Status: She is alert and oriented to person, place, and time.  Psychiatric:        Mood and Affect: Mood normal.        Behavior: Behavior normal.     LABORATORY DATA:  I have reviewed the labs as listed.  CBC Latest Ref Rng & Units 05/26/2020 05/02/2020 04/23/2020  WBC 4.0 - 10.5 K/uL 7.4 10.8(H) 9.0  Hemoglobin 12.0 - 15.0 g/dL 11.3(L) 10.7(L) 11.4(L)  Hematocrit 36.0 - 46.0 % 36.7 35.2(L) 37.0  Platelets 150 - 400 K/uL 181 189 253   CMP Latest Ref Rng & Units 05/26/2020 05/02/2020 04/23/2020  Glucose 70 - 99 mg/dL 87 176(H) 387(H)  BUN 8 - 23 mg/dL 13 9 10   Creatinine 0.44 - 1.00 mg/dL 0.72 0.74 0.82  Sodium 135 - 145 mmol/L 141 144 137  Potassium 3.5 - 5.1 mmol/L 4.2 3.4(L) 3.6  Chloride 98 - 111 mmol/L 109 108 98  CO2 22 - 32 mmol/L 25 23 28   Calcium 8.9 - 10.3 mg/dL 7.8(L) 7.5(L) 8.5(L)  Total Protein 6.5 - 8.1 g/dL 6.7 6.6 6.3(L)  Total Bilirubin 0.3 - 1.2 mg/dL 0.9 0.8 1.0  Alkaline Phos 38 - 126 U/L 59 61 73  AST 15 - 41 U/L 13(L) 15 20  ALT 0 - 44 U/L 13 13 29       Component Value Date/Time   RBC 3.80 (L) 05/26/2020 1159   MCV 96.6 05/26/2020 1159   MCH 29.7 05/26/2020 1159   MCHC 30.8 05/26/2020 1159   RDW 17.8 (H) 05/26/2020 1159   LYMPHSABS 3.2  05/26/2020 1159   MONOABS 0.9 05/26/2020 1159   EOSABS 0.4 05/26/2020 1159   BASOSABS 0.2 (H) 05/26/2020 1159   Lab Results  Component Value Date   TOTALPROTELP 5.8 (L) 05/02/2020   TOTALPROTELP 6.1 05/02/2020   ALBUMINELP 3.5 05/02/2020   A1GS 0.3 05/02/2020   A2GS 0.7 05/02/2020   BETS 1.0 05/02/2020   GAMS 0.7 05/02/2020   MSPIKE 0.1 (H) 05/02/2020   SPEI Comment 05/02/2020    Lab Results  Component Value Date   KPAFRELGTCHN 28.9 (H) 05/02/2020   LAMBDASER 17.3 05/02/2020   KAPLAMBRATIO 1.67 (H) 05/02/2020    DIAGNOSTIC IMAGING:  I have independently reviewed the scans and discussed with the patient. No results found.   ASSESSMENT:  1. Normocytic anemia: -Patient seen at the request of Dr. Buelah Manis for further work-up and management of normocytic anemia. -Recent CBC on 08/21/2019 shows hemoglobin 6.9 with MCV of 88.3. White count and platelets are normal. No history of CKD. Denies any bleeding per rectum or melena. Last colonoscopy on 04/01/2014 shows diffusely pigmented rectal mucosa consistent with melanosis [name] Diffusely pigmented colonic mucosa, 2 diminutive polyps in the mid ascending segment, otherwise the remainder of the colon mucosa was normal. Biopsy consistent with tubular adenoma. -CT renal study on 05/11/2019 shows normal-sized spleen with normal liver. No other abnormal adenopathy. -Denies any prior history of blood transfusion. She is currently taking iron tablet twice daily.  2. IgG lambda plasma cell myeloma: -Skeletal survey on 09/12/2019 shows diffuse faint lucencies in the skull, bilateral femurs. -Bone marrow biopsy on 09/27/2019 shows 70% plasma cells in hypercellular marrow. Chromosome analysis was 13, XX.FISH panel was normal. However quality of specimen is compromised. -LDH normal, beta-2 microglobulin 2.9. M spike was 2.1 g. Kappa light chains 19.7, lambda light chains 14.3 with ratio of 1.38. -24-hour urine shows  nonnephrotic range proteinuria. Urine immunofixation was positive.   PLAN:  1.IgG lambda plasma cell myeloma: -We have discontinued Velcade at last visit.  We have also discontinued dexamethasone because of poorly controlled blood sugars. - She is taking Revlimid 2 weeks on 1 week off very well.  She started next cycle on 05/25/2020. - Reviewed myeloma labs from 05/01/2020 which showed M spike has increased to 0.1 g.  Immunofixation was normal.  Free light chain ratio was 1.67 with kappa light chains 28.9. - Labs from today shows normal CBC and LFTs.  Calcium is 7.8 and creatinine 0.7.  Myeloma labs from today are pending. - She has tolerated first dose of denosumab very well on 04/23/2020.  She was encouraged to continue taking calcium supplements.  She will proceed with her second dose today as her corrected calcium is above 8. - RTC 4 weeks for follow-up.  2. Diabetes: -Continue glipizide and metformin.  3. Hypertension: -Continue amlodipine and HCTZ.  4. Hypothyroidism: -Continue Synthroid daily.  5. B12 deficiency: -Continue B12 injections monthly.  6.Left knee pain: -Continue tramadol 50 mg every 12 hours as needed. - She reported pain in the left femur  as well as left hip region.  She also has occasional pains in the right femur.  Will obtain x-rays.  7. Infection prophylaxis: -Continue acyclovir 400 mg twice daily and aspirin 81 mg daily.  8.Syncopal episodes: -She previously had 2 episodes of syncope.  Lately she has not had any.  Orders placed this encounter:  No orders of the defined types were placed in this encounter.    Derek Jack, MD [place] [phone]   I, Milinda Antis, am acting as a scribe for Dr. Sanda Linger.  I, Derek Jack MD, have reviewed the above documentation for accuracy and completeness, and I agree with the above.

## 2020-05-26 NOTE — Patient Instructions (Signed)
[place] at [facility] Discharge Instructions  You were seen today by Dr. Delton Coombes. He went over your recent results. You received your Xgeva injection today. You will be scheduled to have x-rays of your hips and knees done to determine a cause of the pain. Dr. Delton Coombes will see you back in 1 month for labs and follow up.   Thank you for choosing [place] at [facility] to provide your oncology and hematology care.  To afford each patient quality time with our provider, please arrive at least 15 minutes before your scheduled appointment time.   If you have a lab appointment with the [place] please come in thru the Main Entrance and check in at the main information desk  You need to re-schedule your appointment should you arrive 10 or more minutes late.  We strive to give you quality time with our providers, and arriving late affects you and other patients whose appointments are after yours.  Also, if you no show three or more times for appointments you may be dismissed from the clinic at the providers discretion.     Again, thank you for choosing [facility].  Our hope is that these requests will decrease the amount of time that you wait before being seen by our physicians.       _____________________________________________________________  Should you have questions after your visit to [facility], please contact our office at [phone] between the hours of 8:00 a.m. and 4:30 p.m.  Voicemails left after 4:00 p.m. will not be returned until the following business day.  For prescription refill requests, have your pharmacy contact our office and allow 72 hours.    Cancer Center Support Programs:   > Cancer Support Group  2nd Tuesday of the month 1pm-2pm, Journey Room

## 2020-05-26 NOTE — Progress Notes (Signed)
Patient was assessed by Dr. Delton Coombes and labs have been reviewed.  Patient is okay to proceed with Xgeva injection today. Dr. Delton Coombes is aware of calcium level. Primary RN and pharmacy aware.

## 2020-05-26 NOTE — Patient Instructions (Signed)
X-geva today.  Return as scheduled.  Please call the clinic if you have any questions or concerns.

## 2020-05-26 NOTE — Progress Notes (Signed)
Pt here for x-geva today.  Calcium is 7.8.  Okay for x-geva today per Dr Raliegh Ip. Consent signed.  Pt is taking calcium.  Kim Mills presents today for injection per the provider's orders.  X-geva administration without incident; injection site WNL; see MAR for injection details.  Patient tolerated procedure well and without incident.  No questions or complaints noted at this time. Pt stable during and after injection.  AVS reviewed.  Pt discharged via wheelchair with daughter.

## 2020-05-27 LAB — KAPPA/LAMBDA LIGHT CHAINS
Kappa free light chain: 31.5 mg/L — ABNORMAL HIGH (ref 3.3–19.4)
Kappa, lambda light chain ratio: 1.67 — ABNORMAL HIGH (ref 0.26–1.65)
Lambda free light chains: 18.9 mg/L (ref 5.7–26.3)

## 2020-05-27 LAB — PROTEIN ELECTROPHORESIS, SERUM
A/G Ratio: 1.2 (ref 0.7–1.7)
Albumin ELP: 3.3 g/dL (ref 2.9–4.4)
Alpha-1-Globulin: 0.2 g/dL (ref 0.0–0.4)
Alpha-2-Globulin: 0.7 g/dL (ref 0.4–1.0)
Beta Globulin: 1.1 g/dL (ref 0.7–1.3)
Gamma Globulin: 0.7 g/dL (ref 0.4–1.8)
Globulin, Total: 2.8 g/dL (ref 2.2–3.9)
Total Protein ELP: 6.1 g/dL (ref 6.0–8.5)

## 2020-05-27 LAB — IMMUNOFIXATION ELECTROPHORESIS
IgA: 317 mg/dL (ref 64–422)
IgG (Immunoglobin G), Serum: 810 mg/dL (ref 586–1602)
IgM (Immunoglobulin M), Srm: 22 mg/dL — ABNORMAL LOW (ref 26–217)
Total Protein ELP: 6.1 g/dL (ref 6.0–8.5)

## 2020-06-02 ENCOUNTER — Other Ambulatory Visit (HOSPITAL_COMMUNITY): Payer: Self-pay | Admitting: Hematology

## 2020-06-03 ENCOUNTER — Other Ambulatory Visit (HOSPITAL_COMMUNITY): Payer: Self-pay

## 2020-06-03 ENCOUNTER — Encounter (HOSPITAL_COMMUNITY): Payer: Self-pay

## 2020-06-03 MED ORDER — LENALIDOMIDE 15 MG PO CAPS: 15.0000 mg | ORAL_CAPSULE | Freq: Every day | ORAL | 0 refills | Status: DC

## 2020-06-03 NOTE — Progress Notes (Signed)
Chart reviewed. Revlimid refilled per Dr. Delton Coombes

## 2020-06-16 DIAGNOSIS — H25013 Cortical age-related cataract, bilateral: Secondary | ICD-10-CM | POA: Diagnosis not present

## 2020-06-16 DIAGNOSIS — E113293 Type 2 diabetes mellitus with mild nonproliferative diabetic retinopathy without macular edema, bilateral: Secondary | ICD-10-CM | POA: Diagnosis not present

## 2020-06-16 DIAGNOSIS — H2513 Age-related nuclear cataract, bilateral: Secondary | ICD-10-CM | POA: Diagnosis not present

## 2020-06-16 DIAGNOSIS — H401131 Primary open-angle glaucoma, bilateral, mild stage: Secondary | ICD-10-CM | POA: Diagnosis not present

## 2020-06-16 LAB — HM DIABETES EYE EXAM

## 2020-06-17 ENCOUNTER — Encounter: Payer: Self-pay | Admitting: *Deleted

## 2020-06-18 ENCOUNTER — Other Ambulatory Visit (HOSPITAL_COMMUNITY): Payer: Self-pay

## 2020-06-18 MED ORDER — LENALIDOMIDE 15 MG PO CAPS: 15.0000 mg | ORAL_CAPSULE | Freq: Every day | ORAL | 0 refills | Status: DC

## 2020-06-18 NOTE — Telephone Encounter (Signed)
Chart reviewed. Revlimid refilled per Dr. Delton Coombes

## 2020-06-19 ENCOUNTER — Other Ambulatory Visit: Payer: Self-pay | Admitting: Internal Medicine

## 2020-06-23 ENCOUNTER — Inpatient Hospital Stay (HOSPITAL_COMMUNITY): Payer: Medicare Other | Attending: Hematology

## 2020-06-23 ENCOUNTER — Inpatient Hospital Stay (HOSPITAL_BASED_OUTPATIENT_CLINIC_OR_DEPARTMENT_OTHER): Payer: Medicare Other | Admitting: Hematology

## 2020-06-23 ENCOUNTER — Inpatient Hospital Stay (HOSPITAL_COMMUNITY): Payer: Medicare Other

## 2020-06-23 ENCOUNTER — Other Ambulatory Visit: Payer: Self-pay

## 2020-06-23 VITALS — BP 132/77 | HR 50 | Temp 97.0°F | Resp 18 | Wt 176.7 lb

## 2020-06-23 DIAGNOSIS — C9000 Multiple myeloma not having achieved remission: Secondary | ICD-10-CM | POA: Diagnosis not present

## 2020-06-23 DIAGNOSIS — C9001 Multiple myeloma in remission: Secondary | ICD-10-CM

## 2020-06-23 DIAGNOSIS — D649 Anemia, unspecified: Secondary | ICD-10-CM | POA: Diagnosis not present

## 2020-06-23 DIAGNOSIS — Z7984 Long term (current) use of oral hypoglycemic drugs: Secondary | ICD-10-CM | POA: Diagnosis not present

## 2020-06-23 DIAGNOSIS — E119 Type 2 diabetes mellitus without complications: Secondary | ICD-10-CM | POA: Diagnosis not present

## 2020-06-23 DIAGNOSIS — M25551 Pain in right hip: Secondary | ICD-10-CM | POA: Insufficient documentation

## 2020-06-23 DIAGNOSIS — I10 Essential (primary) hypertension: Secondary | ICD-10-CM | POA: Diagnosis not present

## 2020-06-23 DIAGNOSIS — E039 Hypothyroidism, unspecified: Secondary | ICD-10-CM | POA: Insufficient documentation

## 2020-06-23 DIAGNOSIS — M25552 Pain in left hip: Secondary | ICD-10-CM | POA: Insufficient documentation

## 2020-06-23 DIAGNOSIS — Z79899 Other long term (current) drug therapy: Secondary | ICD-10-CM | POA: Diagnosis not present

## 2020-06-23 LAB — COMPREHENSIVE METABOLIC PANEL
ALT: 18 U/L (ref 0–44)
AST: 19 U/L (ref 15–41)
Albumin: 3.6 g/dL (ref 3.5–5.0)
Alkaline Phosphatase: 61 U/L (ref 38–126)
Anion gap: 10 (ref 5–15)
BUN: 13 mg/dL (ref 8–23)
CO2: 26 mmol/L (ref 22–32)
Calcium: 7 mg/dL — ABNORMAL LOW (ref 8.9–10.3)
Chloride: 107 mmol/L (ref 98–111)
Creatinine, Ser: 0.7 mg/dL (ref 0.44–1.00)
GFR, Estimated: >60 mL/min (ref >60)
Glucose, Bld: 103 mg/dL — ABNORMAL HIGH (ref 70–99)
Potassium: 3.4 mmol/L — ABNORMAL LOW (ref 3.5–5.1)
Sodium: 143 mmol/L (ref 135–145)
Total Bilirubin: 1.2 mg/dL (ref 0.3–1.2)
Total Protein: 6.9 g/dL (ref 6.5–8.1)

## 2020-06-23 LAB — CBC WITH DIFFERENTIAL/PLATELET
Abs Immature Granulocytes: 0.03 10*3/uL (ref 0.00–0.07)
Basophils Absolute: 0.1 10*3/uL (ref 0.0–0.1)
Basophils Relative: 1 %
Eosinophils Absolute: 0.4 10*3/uL (ref 0.0–0.5)
Eosinophils Relative: 6 %
HCT: 36.1 % (ref 36.0–46.0)
Hemoglobin: 11.4 g/dL — ABNORMAL LOW (ref 12.0–15.0)
Immature Granulocytes: 1 %
Lymphocytes Relative: 45 %
Lymphs Abs: 2.9 10*3/uL (ref 0.7–4.0)
MCH: 29.2 pg (ref 26.0–34.0)
MCHC: 31.6 g/dL (ref 30.0–36.0)
MCV: 92.3 fL (ref 80.0–100.0)
Monocytes Absolute: 0.3 10*3/uL (ref 0.1–1.0)
Monocytes Relative: 5 %
Neutro Abs: 2.6 10*3/uL (ref 1.7–7.7)
Neutrophils Relative %: 42 %
Platelets: 273 10*3/uL (ref 150–400)
RBC: 3.91 MIL/uL (ref 3.87–5.11)
RDW: 16.6 % — ABNORMAL HIGH (ref 11.5–15.5)
WBC: 6.3 10*3/uL (ref 4.0–10.5)
nRBC: 0 % (ref 0.0–0.2)

## 2020-06-23 LAB — LACTATE DEHYDROGENASE: LDH: 170 U/L (ref 98–192)

## 2020-06-23 NOTE — Progress Notes (Signed)
Patient reports taking Revlimid with no adverse side effects.

## 2020-06-23 NOTE — Patient Instructions (Signed)
[place] at [facility] Discharge Instructions  You were seen today by Dr. Delton Coombes. He went over your recent results. You received Xgeva today. Finish your current Revllimid tablets, then stop. Dr. Delton Coombes will see you back in 4 weeks for labs and follow up.   Thank you for choosing [place] at [facility] to provide your oncology and hematology care.  To afford each patient quality time with our provider, please arrive at least 15 minutes before your scheduled appointment time.   If you have a lab appointment with the [place] please come in thru the Main Entrance and check in at the main information desk  You need to re-schedule your appointment should you arrive 10 or more minutes late.  We strive to give you quality time with our providers, and arriving late affects you and other patients whose appointments are after yours.  Also, if you no show three or more times for appointments you may be dismissed from the clinic at the providers discretion.     Again, thank you for choosing [facility].  Our hope is that these requests will decrease the amount of time that you wait before being seen by our physicians.       _____________________________________________________________  Should you have questions after your visit to [facility], please contact our office at [phone] between the hours of 8:00 a.m. and 4:30 p.m.  Voicemails left after 4:00 p.m. will not be returned until the following business day.  For prescription refill requests, have your pharmacy contact our office and allow 72 hours.    Cancer Center Support Programs:   > Cancer Support Group  2nd Tuesday of the month 1pm-2pm, Journey Room

## 2020-06-23 NOTE — Progress Notes (Signed)
[place], [place] [address]   CLINIC:  Medical Oncology/Hematology  PCP:  Lindell Spar, MD [address] / [place] [address] [phone]   REASON FOR VISIT:  Follow-up for multiple myeloma & normocytic anemia  PRIOR THERAPY: Velcade x 9 cycles from 10/29/2019 to 05/02/2020  NGS Results: not done  CURRENT THERAPY:  Revlimid 2/3 weeks  BRIEF ONCOLOGIC HISTORY:  Oncology History  Multiple myeloma not having achieved remission ([place])  10/13/2019 Initial Diagnosis   Multiple myeloma not having achieved remission ([place])   10/29/2019 -  Chemotherapy    Patient is on Treatment Plan: MYELOMA NON-TRANSPLANT CANDIDATES VRD WEEKLY Q21D        CANCER STAGING: Cancer Staging No matching staging information was found for the patient.  INTERVAL HISTORY:  Ms. ALIHA DIEDRICH, a 80 y.o. female, returns for routine follow-up of her multiple myeloma & normocytic anemia. Hillarie was last seen on 05/26/2020.   Today she reports feeling well and she is accompanied by her daughter. She reports watery diarrhea and black accompanied by pain after every meal (3-4 times daily) for a couple months. She has recently started taking Pepto bismol, but diarrhea was present prior. She is not currently taking iron. She occasionally takes imodium which helps. She is taking Revlimid which has no affect on diarrhea. She reports chronic hip pain that is present bilaterally but is worse on left. She has stopped taking the B-12 shots. She reports occasional abdominal cramping.   REVIEW OF SYSTEMS:  Review of Systems  Constitutional: Positive for appetite change (75%) and fatigue (75%).  Gastrointestinal: Positive for diarrhea (watery, dark).  Neurological: Positive for dizziness (while turning), headaches (occasional) and numbness (feet).  All other systems reviewed and are negative.   PAST MEDICAL/SURGICAL HISTORY:  Past Medical History:  Diagnosis Date  .  Arthritis   . Cancer ([place])   . Cataract   . Chest pain    Associated with weakness and fatigue  . Diabetes mellitus    A1c of 7.4 in 08/2010  . GERD (gastroesophageal reflux disease)   . Glaucoma   . Hyperlipidemia    Lipid profile in 08/2010:190, 121, 48, 118; normal CBC and CMet  . Hypertension    Lipid profile in 08/2010:190, 121, 48, 118; normal CBC and CMet  . Hypothyroidism   . Hypothyroidism   . Obesity    Past Surgical History:  Procedure Laterality Date  . CHOLECYSTECTOMY    . COLONOSCOPY N/A 04/01/2014   RMR: Melanosis coli. colonic polyps removed as described above.   . CYSTOSCOPY W/ URETERAL STENT PLACEMENT Right 09/13/2019   Procedure: CYSTOSCOPY WITH RIGHT  RETROGRADE PYELOGRAM; BLADDER BIOPSY;  Surgeon: Cleon Gustin, MD;  Location: [facility];  Service: Urology;  Laterality: Right;  . LEFT HEART CATH AND CORONARY ANGIOGRAPHY N/A 10/07/2017   Procedure: LEFT HEART CATH AND CORONARY ANGIOGRAPHY;  Surgeon: Troy Sine, MD;  Location: [facility];  Service: Cardiovascular;  Laterality: N/A;  . THYROIDECTOMY, PARTIAL      SOCIAL HISTORY:  Social History   Socioeconomic History  . Marital status: Widowed    Spouse name: Not on file  . Number of children: 7  . Years of education: Not on file  . Highest education level: Not on file  Occupational History    Employer: RETIRED  Tobacco Use  . Smoking status: Passive Smoke Exposure - Never Smoker  . Smokeless tobacco: Never Used  Vaping Use  .  Vaping Use: Never used  Substance and Sexual Activity  . Alcohol use: Never  . Drug use: Never  . Sexual activity: Not Currently  Other Topics Concern  . Not on file  Social History Narrative  . Not on file   Social Determinants of Health   Financial Resource Strain: Not on file  Food Insecurity: Not on file  Transportation Needs: No Transportation Needs  . Lack of Transportation (Medical): No  . Lack of Transportation (Non-Medical): No  Physical Activity:  Inactive  . Days of Exercise per Week: 0 days  . Minutes of Exercise per Session: 0 min  Stress: Not on file  Social Connections: Not on file  Intimate Partner Violence: Not At Risk  . Fear of Current or Ex-Partner: No  . Emotionally Abused: No  . Physically Abused: No  . Sexually Abused: No    FAMILY HISTORY:  Family History  Problem Relation Age of Onset  . Anemia Father   . Arthritis Mother   . Stroke Brother   . Cancer Sister        unknown kind  . Diabetes Sister   . Dementia Sister   . Cancer Niece        Breast  . Cancer Brother   . Healthy Son   . Healthy Son   . Healthy Son   . Healthy Daughter   . Healthy Daughter   . Healthy Daughter   . Sarcoidosis Daughter     CURRENT MEDICATIONS:  Current Outpatient Medications  Medication Sig Dispense Refill  . acyclovir (ZOVIRAX) 400 MG tablet Take 1 tablet (400 mg total) by mouth 2 (two) times daily. 60 tablet 6  . amLODipine (NORVASC) 10 MG tablet Take 1 tablet (10 mg total) by mouth daily. 90 tablet 3  . ASPIRIN 81 PO Take 1 tablet by mouth daily.    . Blood Glucose Monitoring Suppl (BLOOD GLUCOSE SYSTEM PAK) KIT Please dispense based on patient and insurance preference. Use as directed to monitor FSBS 2x daily. Dx: E11.9. 1 each 1  . fluticasone (FLONASE) 50 MCG/ACT nasal spray Place 2 sprays into both nostrils daily. 16 g 6  . gabapentin (NEURONTIN) 300 MG capsule TAKE ONE CAPSULE BY MOUTH EVERY NIGHT AT BEDTIME 90 capsule 3  . glipiZIDE (GLUCOTROL) 5 MG tablet TAKE ONE TABLET ($RemoveBef'5MG'bZMBLaVaLa$  TOTAL) BY MOUTH TWOTIMES DAILY 180 tablet 3  . Glucose Blood (BLOOD GLUCOSE TEST STRIPS) STRP Please dispense based on patient and insurance preference. Use as directed to monitor FSBS 2x daily. Dx: E11.9. 100 each 11  . HM LORATADINE 10 MG tablet TAKE ONE TABLET ($RemoveBef'10MG'BFIfeXmnvV$  TOTAL) BY MOUTH DAILY 30 tablet 0  . hydrochlorothiazide (MICROZIDE) 12.5 MG capsule TAKE ONE CAPSULE BY MOUTH DAILY (Patient taking differently: Take 12.5 mg by mouth  daily.) 90 capsule 3  . HYDROcodone-acetaminophen (NORCO/VICODIN) 5-325 MG tablet Take 1 tablet by mouth every 6 (six) hours as needed for moderate pain.    . Lancets MISC Please dispense based on patient and insurance preference. Use as directed to monitor FSBS 2x daily. Dx: E11.9. 100 each 11  . lenalidomide (REVLIMID) 15 MG capsule Take 1 capsule (15 mg total) by mouth daily. 14 capsule 0  . lidocaine (XYLOCAINE) 2 % solution     . magic mouthwash w/lidocaine SOLN Take 5 mLs by mouth 4 (four) times daily. 450 mL 0  . magnesium oxide (MAG-OX) 400 (241.3 Mg) MG tablet Take 1 tablet (400 mg total) by mouth 2 (two) times daily. 60 tablet  0  . meclizine (ANTIVERT) 25 MG tablet Take 25 mg by mouth every 6 (six) hours as needed for dizziness.     . metFORMIN (GLUCOPHAGE) 1000 MG tablet TAKE ONE TABLET BY MOUTH TWICE A DAY 60 tablet 3  . metoprolol tartrate (LOPRESSOR) 25 MG tablet TAKE ONE-HALF TABLET (12.5MG TOTAL) BY MOUTH TWO TIMES DAILY 90 tablet 3  . omeprazole (PRILOSEC) 40 MG capsule TAKE ONE CAPSULE BY MOUTH DAILY 90 capsule 3  . potassium chloride SA (KLOR-CON) 20 MEQ tablet Take 1 tablet (20 mEq total) by mouth 2 (two) times daily. 10 tablet 0  . prochlorperazine (COMPAZINE) 10 MG tablet Take 1 tablet (10 mg total) by mouth every 6 (six) hours as needed (Nausea or vomiting). 30 tablet 1  . simvastatin (ZOCOR) 40 MG tablet TAKE ONE TABLET (40MG TOTAL) BY MOUTH BEDTIME 90 tablet 3  . SYNTHROID 137 MCG tablet TAKE ONE TABLET (137MCG TOTAL) BY MOUTH DAILY BEFORE BREAKFAST 30 tablet 0  . tolterodine (DETROL LA) 4 MG 24 hr capsule TAKE ONE CAPSULE BY MOUTH DAILY (Patient taking differently: Take 4 mg by mouth daily.) 90 capsule 3  . traMADol (ULTRAM) 50 MG tablet TAKE ONE TABLET (50MG TOTAL) BY MOUTH TWO TIMES DAILY AS NEEDED 60 tablet 0   No current facility-administered medications for this visit.    ALLERGIES:  No Known Allergies  PHYSICAL EXAM:  Performance status (ECOG): 3 -  Symptomatic, >50% confined to bed  There were no vitals filed for this visit. Wt Readings from Last 3 Encounters:  05/26/20 184 lb 14.4 oz (83.9 kg)  05/02/20 176 lb 12.8 oz (80.2 kg)  04/23/20 183 lb 1.6 oz (83.1 kg)   Physical Exam Vitals reviewed.  Constitutional:      Appearance: Normal appearance.  Cardiovascular:     Rate and Rhythm: Normal rate and regular rhythm.     Pulses: Normal pulses.     Heart sounds: Normal heart sounds.  Pulmonary:     Effort: Pulmonary effort is normal.     Breath sounds: Normal breath sounds.  Abdominal:     Palpations: Abdomen is soft. There is no hepatomegaly, splenomegaly or mass.     Tenderness: There is no abdominal tenderness.  Musculoskeletal:     Right lower leg: No edema.     Left lower leg: No edema.  Neurological:     General: No focal deficit present.     Mental Status: She is alert and oriented to person, place, and time.  Psychiatric:        Mood and Affect: Mood normal.        Behavior: Behavior normal.      LABORATORY DATA:  I have reviewed the labs as listed.  CBC Latest Ref Rng & Units 05/26/2020 05/02/2020 04/23/2020  WBC 4.0 - 10.5 K/uL 7.4 10.8(H) 9.0  Hemoglobin 12.0 - 15.0 g/dL 11.3(L) 10.7(L) 11.4(L)  Hematocrit 36.0 - 46.0 % 36.7 35.2(L) 37.0  Platelets 150 - 400 K/uL 181 189 253   CMP Latest Ref Rng & Units 05/26/2020 05/02/2020 04/23/2020  Glucose 70 - 99 mg/dL 87 176(H) 387(H)  BUN 8 - 23 mg/dL 13 9 10   Creatinine 0.44 - 1.00 mg/dL 0.72 0.74 0.82  Sodium 135 - 145 mmol/L 141 144 137  Potassium 3.5 - 5.1 mmol/L 4.2 3.4(L) 3.6  Chloride 98 - 111 mmol/L 109 108 98  CO2 22 - 32 mmol/L 25 23 28   Calcium 8.9 - 10.3 mg/dL 7.8(L) 7.5(L) 8.5(L)  Total Protein 6.5 -  8.1 g/dL 6.7 6.6 6.3(L)  Total Bilirubin 0.3 - 1.2 mg/dL 0.9 0.8 1.0  Alkaline Phos 38 - 126 U/L 59 61 73  AST 15 - 41 U/L 13(L) 15 20  ALT 0 - 44 U/L 13 13 29     DIAGNOSTIC IMAGING:  I have independently reviewed the scans and discussed with the  patient. DG Pelvis 1-2 Views  Result Date: 05/27/2020 CLINICAL DATA:  Pelvic pain.  History of myeloma. EXAM: PELVIS - 1-2 VIEW COMPARISON:  09/12/2019 FINDINGS: Mild diffuse osteopenia again noted. No focal bony lesions are present. No fracture or dislocation identified. Degenerative changes in the hips and LOWER lumbar spine again identified. IMPRESSION: No focal bony lesions. No acute abnormality. Electronically Signed   By: Margarette Canada M.D.   On: 05/27/2020 13:34   DG FEMUR 1V LEFT  Result Date: 05/27/2020 CLINICAL DATA:  Multiple myeloma. EXAM: LEFT FEMUR 1 VIEW COMPARISON:  04/25/2019 and prior studies FINDINGS: Mild diffuse osteopenia is noted. No definite focal bony lesions are present. The previously identified lucencies on 09/12/2019 are not well defined today. No evidence of fracture or dislocation. Degenerative changes in the hip and knee are present. IMPRESSION: No definite focal bony lesions. The previously identified lucencies on 09/12/2019 are not well-defined today. Electronically Signed   By: Margarette Canada M.D.   On: 05/27/2020 13:36   DG FEMUR 1V RIGHT  Result Date: 05/27/2020 CLINICAL DATA:  Multiple myeloma. EXAM: RIGHT FEMUR 1 VIEW COMPARISON:  04/25/2019 and 09/12/2019 radiographs FINDINGS: No focal bony lesions are present diffuse osteopenia is noted. Mild osteopenia noted. The previously identified subtle lucencies on 09/12/2019 are not defined today. Degenerative changes in the hip and knee are present. IMPRESSION: No acute abnormalities. Previously identified subtle lucencies on 09/12/2019 are not defined today. Electronically Signed   By: Margarette Canada M.D.   On: 05/27/2020 13:38     ASSESSMENT:  1. Normocytic anemia: -Patient seen at the request of Dr. Buelah Manis for further work-up and management of normocytic anemia. -Recent CBC on 08/21/2019 shows hemoglobin 6.9 with MCV of 88.3. White count and platelets are normal. No history of CKD. Denies any bleeding per rectum or  melena. Last colonoscopy on 04/01/2014 shows diffusely pigmented rectal mucosa consistent with melanosis [name] Diffusely pigmented colonic mucosa, 2 diminutive polyps in the mid ascending segment, otherwise the remainder of the colon mucosa was normal. Biopsy consistent with tubular adenoma. -CT renal study on 05/11/2019 shows normal-sized spleen with normal liver. No other abnormal adenopathy. -Denies any prior history of blood transfusion. She is currently taking iron tablet twice daily.  2. IgG lambda plasma cell myeloma: -Skeletal survey on 09/12/2019 shows diffuse faint lucencies in the skull, bilateral femurs. -Bone marrow biopsy on 09/27/2019 shows 70% plasma cells in hypercellular marrow. Chromosome analysis was 55, XX.FISH panel was normal. However quality of specimen is compromised. -LDH normal, beta-2 microglobulin 2.9. M spike was 2.1 g. Kappa light chains 19.7, lambda light chains 14.3 with ratio of 1.38. -24-hour urine shows nonnephrotic range proteinuria. Urine immunofixation was positive.   PLAN:  1.IgG lambda plasma cell myeloma: -She is currently taking Revlimid 2 weeks on/1 week off. - We have discontinued dexamethasone due to poorly controlled blood sugars. - Reviewed myeloma panel from 05/26/2020 which showed normal SPEP and immunofixation.  Lambda light chains are 18.9 and ratio is 1.67. - Recommend holding off on Revlimid after taking last dose on 06/28/2020 as she is having diarrhea which could be related to Revlimid.  I  will reevaluate her in 4 weeks.  We will follow-up on myeloma panel from today.  2. Diabetes: -Continue glipizide and metformin.  3. Hypertension: -Continue amlodipine and HCTZ.  4. Hypothyroidism: -Continue Synthroid daily.  5.  Diarrhea: - She is having diarrhea on and off for the past 2 months.  This is particularly pronounced after eating. - She rarely took Imodium which helped.  She also took  Pepto-Bismol.  She had reported some black coloration of the stools even prior to taking Pepto-Bismol.  Her hemoglobin has been stable. - Likely Revlimid induced.  We will hold off on Revlimid after taking last dose On 06/28/2020. - We will recheck her in 4 weeks for follow-up.  6.Left knee pain: -We have reviewed x-rays which did not show any myeloma lesions.  Continue tramadol 50 mg every 12 hours as needed.  7. Infection prophylaxis: -Continue acyclovir 400 mg twice daily and aspirin 81 mg daily.  8.  Bone protection: -She had received 2 doses of Xgeva and tolerated them very well. As she is not taking calcium supplements.  Calcium today 7.0.  We will hold Xgeva.  She was told to take calcium twice daily.   Orders placed this encounter:  No orders of the defined types were placed in this encounter.    Derek Jack, MD [place] [phone]   I, Thana Ates, am acting as a scribe for Dr. Derek Jack.  I, Derek Jack MD, have reviewed the above documentation for accuracy and completeness, and I agree with the above.

## 2020-06-24 ENCOUNTER — Ambulatory Visit (INDEPENDENT_AMBULATORY_CARE_PROVIDER_SITE_OTHER): Payer: Medicare Other | Admitting: Gastroenterology

## 2020-06-24 ENCOUNTER — Ambulatory Visit (INDEPENDENT_AMBULATORY_CARE_PROVIDER_SITE_OTHER): Payer: Medicare Other | Admitting: Internal Medicine

## 2020-06-24 ENCOUNTER — Encounter: Payer: Self-pay | Admitting: Internal Medicine

## 2020-06-24 ENCOUNTER — Encounter: Payer: Self-pay | Admitting: Gastroenterology

## 2020-06-24 VITALS — BP 152/70 | HR 65 | Temp 98.1°F | Resp 18 | Ht 64.0 in | Wt 177.1 lb

## 2020-06-24 VITALS — BP 143/67 | HR 69 | Temp 96.8°F | Ht 64.0 in | Wt 168.2 lb

## 2020-06-24 DIAGNOSIS — R252 Cramp and spasm: Secondary | ICD-10-CM

## 2020-06-24 DIAGNOSIS — I2510 Atherosclerotic heart disease of native coronary artery without angina pectoris: Secondary | ICD-10-CM | POA: Diagnosis not present

## 2020-06-24 DIAGNOSIS — K921 Melena: Secondary | ICD-10-CM

## 2020-06-24 DIAGNOSIS — R197 Diarrhea, unspecified: Secondary | ICD-10-CM | POA: Diagnosis not present

## 2020-06-24 DIAGNOSIS — I10 Essential (primary) hypertension: Secondary | ICD-10-CM

## 2020-06-24 DIAGNOSIS — E039 Hypothyroidism, unspecified: Secondary | ICD-10-CM | POA: Diagnosis not present

## 2020-06-24 DIAGNOSIS — E1143 Type 2 diabetes mellitus with diabetic autonomic (poly)neuropathy: Secondary | ICD-10-CM

## 2020-06-24 LAB — KAPPA/LAMBDA LIGHT CHAINS
Kappa free light chain: 38.1 mg/L — ABNORMAL HIGH (ref 3.3–19.4)
Kappa, lambda light chain ratio: 1.61 (ref 0.26–1.65)
Lambda free light chains: 23.6 mg/L (ref 5.7–26.3)

## 2020-06-24 MED ORDER — LOSARTAN POTASSIUM-HCTZ 50-12.5 MG PO TABS: 1.0000 | ORAL_TABLET | Freq: Every day | ORAL | 0 refills | Status: DC

## 2020-06-24 NOTE — Assessment & Plan Note (Signed)
BP Readings from Last 1 Encounters:  06/24/20 (!) 152/70   uncontrolled with Amlodipine, Metoprolol and HCTZ Switched to Losartan-HCTZ instead of HCTZ Counseled for compliance with the medications Advised low salt diet

## 2020-06-24 NOTE — Assessment & Plan Note (Signed)
On Synthroid 137 mcg QD Check TSH and free T4

## 2020-06-24 NOTE — Assessment & Plan Note (Signed)
Lab Results  Component Value Date   HGBA1C 6.9 (H) 08/17/2019   On Metformin and Glipizide 

## 2020-06-24 NOTE — Patient Instructions (Signed)
1. Please stop Pepto or any other products that have bismuth in them. These products will turn your stools black.  2. If you continue to have black stools after stopping Pepto, please let me know.  3. Collect stool and return to lab as soon as possible to rule out infection. 4. You can use low dose imodium (loperimide) 1/2 to 1 tablet (which will equal 1 to 2 mg) once or twice daily as needed. Hold if you develop hard stool.

## 2020-06-24 NOTE — Patient Instructions (Signed)
Please start taking Losartan-HCTZ instead of Hydrochlorothiazide.  Please continue to take other medications as prescribed.  Please continue to follow low salt diet.

## 2020-06-24 NOTE — Progress Notes (Signed)
Primary Care Physician:  Lindell Spar, MD  Primary Gastroenterologist:  Garfield Cornea, MD   Chief Complaint  Patient presents with  . dark stool    Stools are dark and tarry, sometimes dark brown and loose    HPI:  Kim Mills is a 80 y.o. female with past medical history significant for hypothyroidism, hypertension, diabetes, multiple myeloma here for follow-up of February 2022 ER visit at [facility] for constipation. Patient last seen by our practice in 2016.  She has a longstanding history of constipation.  Colonoscopy in 2016 showed melanosis coli, 2 diminutive polyps in the mid ascending colon, otherwise unremarkable.  Pathology showed both polyps to be tubular adenoma and recommended a colonoscopy in 2023 (7-year surveillance).  In the ED on March 16, 2020, evaluated for constipation.  Noted to have large amount of hard brown stool in the rectal vault, disimpacted with good results.  Chronic anemia with hemoglobin in the 10-11 range.  History of low normal B12, normal folate, normal iron, TIBC, ferritin, iron saturations.  Today: Bowels moving better over the last couple of months. Occasionally has to take something, cannot think of the name of it, little pills she gets from [facility]. Last week, when eats, bowels would move. With some urgency. Stools very dark at times. Dark brown and loose or tarry. Used Pepto for several weeks. No nocturnal stools. States she used antibiotics 2 months ago. Has not seen any fresh blood. Some abdominal pain before the BM but goes away after. Appetite intermittent. Not as good as in the past. Wants only certain foods. No heartburn. No dysphagia. Stools dark/black in setting of Pepto use.   Denies any unintentional weight loss. Weight down due to less swelling/edema.   Current Outpatient Medications  Medication Sig Dispense Refill  . acyclovir (ZOVIRAX) 400 MG tablet Take 1 tablet (400 mg total) by mouth 2 (two) times  daily. 60 tablet 6  . amLODipine (NORVASC) 10 MG tablet Take 1 tablet (10 mg total) by mouth daily. 90 tablet 3  . ASPIRIN 81 PO Take 1 tablet by mouth daily.    . Blood Glucose Monitoring Suppl (BLOOD GLUCOSE SYSTEM PAK) KIT Please dispense based on patient and insurance preference. Use as directed to monitor FSBS 2x daily. Dx: E11.9. 1 each 1  . fluticasone (FLONASE) 50 MCG/ACT nasal spray Place 2 sprays into both nostrils daily. 16 g 6  . gabapentin (NEURONTIN) 300 MG capsule TAKE ONE CAPSULE BY MOUTH EVERY NIGHT AT BEDTIME 90 capsule 3  . glipiZIDE (GLUCOTROL) 5 MG tablet TAKE ONE TABLET (5MG TOTAL) BY MOUTH TWOTIMES DAILY 180 tablet 3  . Glucose Blood (BLOOD GLUCOSE TEST STRIPS) STRP Please dispense based on patient and insurance preference. Use as directed to monitor FSBS 2x daily. Dx: E11.9. 100 each 11  . HM LORATADINE 10 MG tablet TAKE ONE TABLET (10MG TOTAL) BY MOUTH DAILY 30 tablet 0  . hydrochlorothiazide (MICROZIDE) 12.5 MG capsule TAKE ONE CAPSULE BY MOUTH DAILY (Patient taking differently: Take 12.5 mg by mouth daily.) 90 capsule 3  . Lancets MISC Please dispense based on patient and insurance preference. Use as directed to monitor FSBS 2x daily. Dx: E11.9. 100 each 11  . lenalidomide (REVLIMID) 15 MG capsule Take 1 capsule (15 mg total) by mouth daily. 14 capsule 0  . lidocaine (XYLOCAINE) 2 % solution     . magic mouthwash w/lidocaine SOLN Take 5 mLs by mouth 4 (four) times daily. 450 mL 0  .  magnesium oxide (MAG-OX) 400 (241.3 Mg) MG tablet Take 1 tablet (400 mg total) by mouth 2 (two) times daily. 60 tablet 0  . meclizine (ANTIVERT) 25 MG tablet Take 25 mg by mouth every 6 (six) hours as needed for dizziness.     . metFORMIN (GLUCOPHAGE) 1000 MG tablet TAKE ONE TABLET BY MOUTH TWICE A DAY 60 tablet 3  . metoprolol tartrate (LOPRESSOR) 25 MG tablet TAKE ONE-HALF TABLET (12.5MG TOTAL) BY MOUTH TWO TIMES DAILY 90 tablet 3  . omeprazole (PRILOSEC) 40 MG capsule TAKE ONE CAPSULE BY  MOUTH DAILY 90 capsule 3  . potassium chloride SA (KLOR-CON) 20 MEQ tablet Take 1 tablet (20 mEq total) by mouth 2 (two) times daily. 10 tablet 0  . prochlorperazine (COMPAZINE) 10 MG tablet Take 1 tablet (10 mg total) by mouth every 6 (six) hours as needed (Nausea or vomiting). 30 tablet 1  . simvastatin (ZOCOR) 40 MG tablet TAKE ONE TABLET (40MG TOTAL) BY MOUTH BEDTIME 90 tablet 3  . SYNTHROID 137 MCG tablet TAKE ONE TABLET (137MCG TOTAL) BY MOUTH DAILY BEFORE BREAKFAST 30 tablet 0  . tolterodine (DETROL LA) 4 MG 24 hr capsule TAKE ONE CAPSULE BY MOUTH DAILY (Patient taking differently: Take 4 mg by mouth daily.) 90 capsule 3  . traMADol (ULTRAM) 50 MG tablet TAKE ONE TABLET (50MG TOTAL) BY MOUTH TWO TIMES DAILY AS NEEDED 60 tablet 0   No current facility-administered medications for this visit.    Allergies as of 06/24/2020  . (No Known Allergies)    Past Medical History:  Diagnosis Date  . Arthritis   . Cancer ([place])   . Cataract   . Chest pain    Associated with weakness and fatigue  . Diabetes mellitus    A1c of 7.4 in 08/2010  . GERD (gastroesophageal reflux disease)   . Glaucoma   . Hyperlipidemia    Lipid profile in 08/2010:190, 121, 48, 118; normal CBC and CMet  . Hypertension    Lipid profile in 08/2010:190, 121, 48, 118; normal CBC and CMet  . Hypothyroidism   . Hypothyroidism   . Obesity     Past Surgical History:  Procedure Laterality Date  . CHOLECYSTECTOMY    . COLONOSCOPY N/A 04/01/2014   RMR: Melanosis coli. colonic polyps removed as described above.   . CYSTOSCOPY W/ URETERAL STENT PLACEMENT Right 09/13/2019   Procedure: CYSTOSCOPY WITH RIGHT  RETROGRADE PYELOGRAM; BLADDER BIOPSY;  Surgeon: Cleon Gustin, MD;  Location: [facility];  Service: Urology;  Laterality: Right;  . LEFT HEART CATH AND CORONARY ANGIOGRAPHY N/A 10/07/2017   Procedure: LEFT HEART CATH AND CORONARY ANGIOGRAPHY;  Surgeon: Troy Sine, MD;  Location: [place] [facility];  Service:  Cardiovascular;  Laterality: N/A;  . THYROIDECTOMY, PARTIAL      Family History  Problem Relation Age of Onset  . Anemia Father   . Arthritis Mother   . Stroke Brother   . Cancer Sister        unknown kind  . Diabetes Sister   . Dementia Sister   . Cancer Niece        Breast  . Cancer Brother   . Healthy Son   . Healthy Son   . Healthy Son   . Healthy Daughter   . Healthy Daughter   . Healthy Daughter   . Sarcoidosis Daughter     Social History   Socioeconomic History  . Marital status: Widowed    Spouse name: Not on file  .  Number of children: 7  . Years of education: Not on file  . Highest education level: Not on file  Occupational History    Employer: RETIRED  Tobacco Use  . Smoking status: Passive Smoke Exposure - Never Smoker  . Smokeless tobacco: Never Used  Vaping Use  . Vaping Use: Never used  Substance and Sexual Activity  . Alcohol use: Never  . Drug use: Never  . Sexual activity: Not Currently  Other Topics Concern  . Not on file  Social History Narrative  . Not on file   Social Determinants of Health   Financial Resource Strain: Not on file  Food Insecurity: Not on file  Transportation Needs: No Transportation Needs  . Lack of Transportation (Medical): No  . Lack of Transportation (Non-Medical): No  Physical Activity: Inactive  . Days of Exercise per Week: 0 days  . Minutes of Exercise per Session: 0 min  Stress: Not on file  Social Connections: Not on file  Intimate Partner Violence: Not At Risk  . Fear of Current or Ex-Partner: No  . Emotionally Abused: No  . Physically Abused: No  . Sexually Abused: No      ROS:  General: Negative for anorexia, weight loss, fever, chills, fatigue, weakness. Eyes: Negative for vision changes.  ENT: Negative for hoarseness, difficulty swallowing , nasal congestion. CV: Negative for chest pain, angina, palpitations, dyspnea on exertion, peripheral edema.  Respiratory: Negative for dyspnea at rest,  dyspnea on exertion, cough, sputum, wheezing.  GI: See history of present illness. GU:  Negative for dysuria, hematuria, urinary incontinence, urinary frequency, nocturnal urination.  MS: Negative for joint pain, low back pain.  Derm: Negative for rash or itching.  Neuro: Negative for weakness, abnormal sensation, seizure, frequent headaches, memory loss, confusion.  Psych: Negative for anxiety, depression, suicidal ideation, hallucinations.  Endo: Negative for unusual weight change.  Heme: Negative for bruising or bleeding. Allergy: Negative for rash or hives.    Physical Examination:  BP (!) 143/67   Pulse 69   Temp (!) 96.8 F (36 C) (Temporal)   Ht _0  (1.626 m)   Wt 168 lb 3.2 oz (76.3 kg)   BMI 28.87 kg/m    General: Well-nourished, well-developed in no acute distress.  Head: Normocephalic, atraumatic.   Eyes: Conjunctiva pink, no icterus. Mouth: masked. Neck: Supple without thyromegaly, masses, or lymphadenopathy.  Lungs: Clear to auscultation bilaterally.  Heart: Regular rate and rhythm, no murmurs rubs or gallops.  Abdomen: Bowel sounds are normal, nontender, nondistended, no hepatosplenomegaly or masses, no abdominal bruits or    hernia , no rebound or guarding.   Rectal: not performed Extremities: No lower extremity edema. No clubbing or deformities.  Neuro: Alert and oriented x 4 , grossly normal neurologically.  Skin: Warm and dry, no rash or jaundice.   Psych: Alert and cooperative, normal mood and affect.  Labs: Lab Results  Component Value Date   CREATININE 0.70 06/23/2020   BUN 13 06/23/2020   NA 143 06/23/2020   K 3.4 (L) 06/23/2020   CL 107 06/23/2020   CO2 26 06/23/2020   Lab Results  Component Value Date   WBC 6.3 06/23/2020   HGB 11.4 (L) 06/23/2020   HCT 36.1 06/23/2020   MCV 92.3 06/23/2020   PLT 273 06/23/2020   Lab Results  Component Value Date   ALT 18 06/23/2020   AST 19 06/23/2020   ALKPHOS 61 06/23/2020   BILITOT 1.2  06/23/2020   No results found for:  LIPASE  Lab Results  Component Value Date   IRON 69 11/12/2019   TIBC 297 11/12/2019   FERRITIN 256 11/12/2019   Lab Results  Component Value Date   VITAMINB12 216 11/12/2019   Lab Results  Component Value Date   FOLATE 8.4 11/12/2019    Imaging Studies: DG Pelvis 1-2 Views  Result Date: 05/27/2020 CLINICAL DATA:  Pelvic pain.  History of myeloma. EXAM: PELVIS - 1-2 VIEW COMPARISON:  09/12/2019 FINDINGS: Mild diffuse osteopenia again noted. No focal bony lesions are present. No fracture or dislocation identified. Degenerative changes in the hips and LOWER lumbar spine again identified. IMPRESSION: No focal bony lesions. No acute abnormality. Electronically Signed   By: Margarette Canada M.D.   On: 05/27/2020 13:34   DG FEMUR 1V LEFT  Result Date: 05/27/2020 CLINICAL DATA:  Multiple myeloma. EXAM: LEFT FEMUR 1 VIEW COMPARISON:  04/25/2019 and prior studies FINDINGS: Mild diffuse osteopenia is noted. No definite focal bony lesions are present. The previously identified lucencies on 09/12/2019 are not well defined today. No evidence of fracture or dislocation. Degenerative changes in the hip and knee are present. IMPRESSION: No definite focal bony lesions. The previously identified lucencies on 09/12/2019 are not well-defined today. Electronically Signed   By: Margarette Canada M.D.   On: 05/27/2020 13:36   DG FEMUR 1V RIGHT  Result Date: 05/27/2020 CLINICAL DATA:  Multiple myeloma. EXAM: RIGHT FEMUR 1 VIEW COMPARISON:  04/25/2019 and 09/12/2019 radiographs FINDINGS: No focal bony lesions are present diffuse osteopenia is noted. Mild osteopenia noted. The previously identified subtle lucencies on 09/12/2019 are not defined today. Degenerative changes in the hip and knee are present. IMPRESSION: No acute abnormalities. Previously identified subtle lucencies on 09/12/2019 are not defined today. Electronically Signed   By: Margarette Canada M.D.   On: 05/27/2020 13:38      Assessment: 80 y/o female presents for evaluation of constipation/fecal impaction prompting ED evaluation in 03/2020. Required fecal disimpaction. She reports bowels have moved better last couple of months, using occasional laxative, she cannot recall name. Stools have been dark, likely due to pepto. Last week she noted stools more frequent and loose, with urgency. Last antibiotics couple of months ago. Possibly related to Revlimid.  Patient has mildly depressed Hgb in mid-11 range but normal Hct. No IDA. Followed by Dr. Delton Coombes. Hgb has been stable.     Plan:  1. Stop Pepto/bismuth products. Let me know if persistent black stools.  2. Collect stool for stool studies.  3. Revlimid to be stopped by Dr. Delton Coombes after dose on 06/28/20 as she is having diarrhea which could be related.  4. Use low dow imodium 1/2 to 1 tablets once or twice daily as needed for diarrhea/loose stools. Hold if constipation.

## 2020-06-24 NOTE — Progress Notes (Signed)
Established Patient Office Visit  Subjective:  Patient ID: Kim Mills, female    DOB: Oct 03, 1940  Age: 80 y.o. MRN: [id number]  CC:  Chief Complaint  Patient presents with  . Follow-up    6 week follow up medication review     HPI Kim Mills  is a 80 year old female with PMH of multiple myeloma, HTN, CAD, DM with neuropathy, CKD stage 3, hypothyroidism, overactive bladder and chronic pain who presents for follow up of her HTN.  Her BP was still elevated, although better compared to last visit. She denies any headache, dizziness, chest pain, dyspnea or palpitations.  She c/o leg cramps while she is sitting. She takes K and Mg supplements regularly. She is on Revlimid for MM.  She takes Levothyroxine regularly for hypothyroidism. Denies any constipation, diarrhea, recent change in appetite or tremors.  Past Medical History:  Diagnosis Date  . Arthritis   . Cancer ([place])   . Cataract   . Chest pain    Associated with weakness and fatigue  . Diabetes mellitus    A1c of 7.4 in 08/2010  . GERD (gastroesophageal reflux disease)   . Glaucoma   . Hyperlipidemia    Lipid profile in 08/2010:190, 121, 48, 118; normal CBC and CMet  . Hypertension    Lipid profile in 08/2010:190, 121, 48, 118; normal CBC and CMet  . Hypothyroidism   . Hypothyroidism   . Obesity     Past Surgical History:  Procedure Laterality Date  . CHOLECYSTECTOMY    . COLONOSCOPY N/A 04/01/2014   RMR: Melanosis coli. colonic polyps removed as described above.   . CYSTOSCOPY W/ URETERAL STENT PLACEMENT Right 09/13/2019   Procedure: CYSTOSCOPY WITH RIGHT  RETROGRADE PYELOGRAM; BLADDER BIOPSY;  Surgeon: Cleon Gustin, MD;  Location: [facility];  Service: Urology;  Laterality: Right;  . LEFT HEART CATH AND CORONARY ANGIOGRAPHY N/A 10/07/2017   Procedure: LEFT HEART CATH AND CORONARY ANGIOGRAPHY;  Surgeon: Troy Sine, MD;  Location: [place] [facility];  Service: Cardiovascular;  Laterality: N/A;  .  THYROIDECTOMY, PARTIAL      Family History  Problem Relation Age of Onset  . Anemia Father   . Arthritis Mother   . Stroke Brother   . Cancer Sister        unknown kind  . Diabetes Sister   . Dementia Sister   . Cancer Niece        Breast  . Cancer Brother   . Healthy Son   . Healthy Son   . Healthy Son   . Healthy Daughter   . Healthy Daughter   . Healthy Daughter   . Sarcoidosis Daughter     Social History   Socioeconomic History  . Marital status: Widowed    Spouse name: Not on file  . Number of children: 7  . Years of education: Not on file  . Highest education level: Not on file  Occupational History    Employer: RETIRED  Tobacco Use  . Smoking status: Passive Smoke Exposure - Never Smoker  . Smokeless tobacco: Never Used  Vaping Use  . Vaping Use: Never used  Substance and Sexual Activity  . Alcohol use: Never  . Drug use: Never  . Sexual activity: Not Currently  Other Topics Concern  . Not on file  Social History Narrative  . Not on file   Social Determinants of Health   Financial Resource Strain: Not on file  Food Insecurity: Not on file  Transportation Needs: No Transportation Needs  . Lack of Transportation (Medical): No  . Lack of Transportation (Non-Medical): No  Physical Activity: Inactive  . Days of Exercise per Week: 0 days  . Minutes of Exercise per Session: 0 min  Stress: Not on file  Social Connections: Not on file  Intimate Partner Violence: Not At Risk  . Fear of Current or Ex-Partner: No  . Emotionally Abused: No  . Physically Abused: No  . Sexually Abused: No    Outpatient Medications Prior to Visit  Medication Sig Dispense Refill  . acyclovir (ZOVIRAX) 400 MG tablet Take 1 tablet (400 mg total) by mouth 2 (two) times daily. 60 tablet 6  . amLODipine (NORVASC) 10 MG tablet Take 1 tablet (10 mg total) by mouth daily. 90 tablet 3  . ASPIRIN 81 PO Take 1 tablet by mouth daily.    . Blood Glucose Monitoring Suppl (BLOOD GLUCOSE  SYSTEM PAK) KIT Please dispense based on patient and insurance preference. Use as directed to monitor FSBS 2x daily. Dx: E11.9. 1 each 1  . fluticasone (FLONASE) 50 MCG/ACT nasal spray Place 2 sprays into both nostrils daily. 16 g 6  . gabapentin (NEURONTIN) 300 MG capsule TAKE ONE CAPSULE BY MOUTH EVERY NIGHT AT BEDTIME 90 capsule 3  . glipiZIDE (GLUCOTROL) 5 MG tablet TAKE ONE TABLET (5MG  TOTAL) BY MOUTH TWOTIMES DAILY 180 tablet 3  . Glucose Blood (BLOOD GLUCOSE TEST STRIPS) STRP Please dispense based on patient and insurance preference. Use as directed to monitor FSBS 2x daily. Dx: E11.9. 100 each 11  . HM LORATADINE 10 MG tablet TAKE ONE TABLET (10MG  TOTAL) BY MOUTH DAILY 30 tablet 0  . Lancets MISC Please dispense based on patient and insurance preference. Use as directed to monitor FSBS 2x daily. Dx: E11.9. 100 each 11  . lenalidomide (REVLIMID) 15 MG capsule Take 1 capsule (15 mg total) by mouth daily. 14 capsule 0  . lidocaine (XYLOCAINE) 2 % solution     . magic mouthwash w/lidocaine SOLN Take 5 mLs by mouth 4 (four) times daily. 450 mL 0  . magnesium oxide (MAG-OX) 400 (241.3 Mg) MG tablet Take 1 tablet (400 mg total) by mouth 2 (two) times daily. 60 tablet 0  . meclizine (ANTIVERT) 25 MG tablet Take 25 mg by mouth every 6 (six) hours as needed for dizziness.     . metFORMIN (GLUCOPHAGE) 1000 MG tablet TAKE ONE TABLET BY MOUTH TWICE A DAY 60 tablet 3  . metoprolol tartrate (LOPRESSOR) 25 MG tablet TAKE ONE-HALF TABLET (12.5MG  TOTAL) BY MOUTH TWO TIMES DAILY 90 tablet 3  . omeprazole (PRILOSEC) 40 MG capsule TAKE ONE CAPSULE BY MOUTH DAILY 90 capsule 3  . potassium chloride SA (KLOR-CON) 20 MEQ tablet Take 1 tablet (20 mEq total) by mouth 2 (two) times daily. 10 tablet 0  . prochlorperazine (COMPAZINE) 10 MG tablet Take 1 tablet (10 mg total) by mouth every 6 (six) hours as needed (Nausea or vomiting). 30 tablet 1  . simvastatin (ZOCOR) 40 MG tablet TAKE ONE TABLET (40MG  TOTAL) BY MOUTH  BEDTIME 90 tablet 3  . SYNTHROID 137 MCG tablet TAKE ONE TABLET (137MCG TOTAL) BY MOUTH DAILY BEFORE BREAKFAST 30 tablet 0  . tolterodine (DETROL LA) 4 MG 24 hr capsule TAKE ONE CAPSULE BY MOUTH DAILY (Patient taking differently: Take 4 mg by mouth daily.) 90 capsule 3  . traMADol (ULTRAM) 50 MG tablet TAKE ONE TABLET (50MG  TOTAL) BY MOUTH TWO TIMES DAILY AS NEEDED 60 tablet 0  .  hydrochlorothiazide (MICROZIDE) 12.5 MG capsule TAKE ONE CAPSULE BY MOUTH DAILY (Patient taking differently: Take 12.5 mg by mouth daily.) 90 capsule 3   No facility-administered medications prior to visit.    No Known Allergies  ROS Review of Systems  Constitutional: Positive for fatigue. Negative for chills and fever.  HENT: Negative for congestion, sinus pressure, sinus pain and sore throat.   Eyes: Negative for pain and discharge.  Respiratory: Negative for cough and shortness of breath.   Cardiovascular: Negative for chest pain and palpitations.  Gastrointestinal: Negative for abdominal pain, constipation, diarrhea, nausea and vomiting.  Endocrine: Negative for polydipsia and polyuria.  Genitourinary: Negative for dysuria and hematuria.  Musculoskeletal: Positive for arthralgias and back pain. Negative for neck pain and neck stiffness.  Skin: Negative for rash.  Neurological: Negative for dizziness and weakness.  Psychiatric/Behavioral: Negative for agitation and behavioral problems.      Objective:    Physical Exam Vitals reviewed.  Constitutional:      General: She is not in acute distress.    Appearance: She is not diaphoretic.     Comments: In wheelchair  HENT:     Head: Normocephalic and atraumatic.     Nose: Nose normal. No congestion.     Mouth/Throat:     Mouth: Mucous membranes are moist.     Pharynx: No posterior oropharyngeal erythema.  Eyes:     General: No scleral icterus.    Extraocular Movements: Extraocular movements intact.     Pupils: Pupils are equal, round, and reactive  to light.  Cardiovascular:     Rate and Rhythm: Normal rate and regular rhythm.     Pulses: Normal pulses.     Heart sounds: Normal heart sounds. No murmur heard.   Pulmonary:     Breath sounds: Normal breath sounds. No wheezing or rales.  Abdominal:     Palpations: Abdomen is soft.     Tenderness: There is no abdominal tenderness.  Musculoskeletal:     Cervical back: Neck supple. No tenderness.     Right lower leg: No edema.     Left lower leg: No edema.  Skin:    General: Skin is warm.     Findings: No rash.  Neurological:     General: No focal deficit present.     Mental Status: She is alert and oriented to person, place, and time.  Psychiatric:        Mood and Affect: Mood normal.        Behavior: Behavior normal.     BP (!) 152/70 (BP Location: Right Arm, Cuff Size: Normal)   Pulse 65   Temp 98.1 F (36.7 C) (Oral)   Resp 18   Ht 5' 4"  (1.626 m)   Wt 177 lb 1.9 oz (80.3 kg)   SpO2 99%   BMI 30.40 kg/m  Wt Readings from Last 3 Encounters:  06/24/20 177 lb 1.9 oz (80.3 kg)  06/24/20 168 lb 3.2 oz (76.3 kg)  06/23/20 176 lb 11.2 oz (80.2 kg)     Health Maintenance Due  Topic Date Due  . FOOT EXAM  Never done  . URINE MICROALBUMIN  Never done  . Hepatitis C Screening  Never done  . PNA vac Low Risk Adult (2 of 2 - PPSV23) 02/09/2019  . COVID-19 Vaccine (2 - Moderna risk 4-dose series) 01/07/2020  . HEMOGLOBIN A1C  02/17/2020    There are no preventive care reminders to display for this patient.  Lab Results  Component  Value Date   TSH 0.10 (L) 08/17/2019   Lab Results  Component Value Date   WBC 6.3 06/23/2020   HGB 11.4 (L) 06/23/2020   HCT 36.1 06/23/2020   MCV 92.3 06/23/2020   PLT 273 06/23/2020   Lab Results  Component Value Date   NA 143 06/23/2020   K 3.4 (L) 06/23/2020   CO2 26 06/23/2020   GLUCOSE 103 (H) 06/23/2020   BUN 13 06/23/2020   CREATININE 0.70 06/23/2020   BILITOT 1.2 06/23/2020   ALKPHOS 61 06/23/2020   AST 19  06/23/2020   ALT 18 06/23/2020   PROT 6.9 06/23/2020   ALBUMIN 3.6 06/23/2020   CALCIUM 7.0 (L) 06/23/2020   ANIONGAP 10 06/23/2020   Lab Results  Component Value Date   CHOL 162 04/13/2019   Lab Results  Component Value Date   HDL 40 (L) 04/13/2019   Lab Results  Component Value Date   LDLCALC 97 04/13/2019   Lab Results  Component Value Date   TRIG 157 (H) 04/13/2019   Lab Results  Component Value Date   CHOLHDL 4.1 04/13/2019   Lab Results  Component Value Date   HGBA1C 6.9 (H) 08/17/2019      Assessment & Plan:   Problem List Items Addressed This Visit      Cardiovascular and Mediastinum   Hypertension    BP Readings from Last 1 Encounters:  06/24/20 (!) 152/70   uncontrolled with Amlodipine, Metoprolol and HCTZ Switched to Losartan-HCTZ instead of HCTZ Counseled for compliance with the medications Advised low salt diet      Relevant Medications   losartan-hydrochlorothiazide (HYZAAR) 50-12.5 MG tablet     Endocrine   Type 2 diabetes mellitus ([facility]) - Primary    Lab Results  Component Value Date   HGBA1C 6.9 (H) 08/17/2019   On Metformin and Glipizide      Relevant Medications   losartan-hydrochlorothiazide (HYZAAR) 50-12.5 MG tablet   Other Relevant Orders   POCT glycosylated hemoglobin (Hb A1C)   Hypothyroidism    On Synthroid 137 mcg QD Check TSH and free T4      Relevant Orders   TSH + free T4    Other Visit Diagnoses    Leg cramps     Could be due to hypokalemia On K supplements Check Mg Have started Losartan now, which would also increase K, so would avoid changing dose of K-Dur for now   Relevant Orders   Magnesium      Meds ordered this encounter  Medications  . losartan-hydrochlorothiazide (HYZAAR) 50-12.5 MG tablet    Sig: Take 1 tablet by mouth daily.    Dispense:  90 tablet    Refill:  0    Follow-up: Return in about 3 months (around 09/24/2020) for HTN and hypothyroidism.    Lindell Spar, MD

## 2020-06-25 ENCOUNTER — Encounter: Payer: Self-pay | Admitting: *Deleted

## 2020-06-25 LAB — POCT GLYCOSYLATED HEMOGLOBIN (HGB A1C): HbA1c, POC (controlled diabetic range): 6.7 % (ref 0.0–7.0)

## 2020-06-25 LAB — PROTEIN ELECTROPHORESIS, SERUM
A/G Ratio: 1.3 (ref 0.7–1.7)
Albumin ELP: 3.7 g/dL (ref 2.9–4.4)
Alpha-1-Globulin: 0.2 g/dL (ref 0.0–0.4)
Alpha-2-Globulin: 0.7 g/dL (ref 0.4–1.0)
Beta Globulin: 1 g/dL (ref 0.7–1.3)
Gamma Globulin: 0.9 g/dL (ref 0.4–1.8)
Globulin, Total: 2.8 g/dL (ref 2.2–3.9)
Total Protein ELP: 6.5 g/dL (ref 6.0–8.5)

## 2020-06-26 LAB — TSH+FREE T4
Free T4: 1.25 ng/dL (ref 0.82–1.77)
TSH: 3.35 u[IU]/mL (ref 0.450–4.500)

## 2020-06-26 LAB — MAGNESIUM: Magnesium: 1.5 mg/dL — ABNORMAL LOW (ref 1.6–2.3)

## 2020-06-28 LAB — IMMUNOFIXATION ELECTROPHORESIS
IgA: 390 mg/dL (ref 64–422)
IgG (Immunoglobin G), Serum: 906 mg/dL (ref 586–1602)
IgM (Immunoglobulin M), Srm: 21 mg/dL — ABNORMAL LOW (ref 26–217)
Total Protein ELP: 6.3 g/dL (ref 6.0–8.5)

## 2020-06-30 LAB — C. DIFFICILE GDH AND TOXIN A/B
GDH ANTIGEN: NOT DETECTED
MICRO NUMBER:: 11901743
SPECIMEN QUALITY:: ADEQUATE
TOXIN A AND B: NOT DETECTED

## 2020-06-30 LAB — GASTROINTESTINAL PATHOGEN PANEL PCR

## 2020-07-01 NOTE — Progress Notes (Signed)
Cc'ed to pcp °

## 2020-07-08 ENCOUNTER — Other Ambulatory Visit: Payer: Self-pay

## 2020-07-08 ENCOUNTER — Emergency Department (HOSPITAL_COMMUNITY): Payer: Medicare Other

## 2020-07-08 ENCOUNTER — Emergency Department (HOSPITAL_COMMUNITY)
Admission: EM | Admit: 2020-07-08 | Discharge: 2020-07-08 | Disposition: A | Discharge status: Home / Self Care | Payer: Medicare Other | Attending: Emergency Medicine | Admitting: Emergency Medicine

## 2020-07-08 ENCOUNTER — Encounter (HOSPITAL_COMMUNITY): Payer: Self-pay | Admitting: *Deleted

## 2020-07-08 DIAGNOSIS — R9431 Abnormal electrocardiogram [ECG] [EKG]: Secondary | ICD-10-CM | POA: Diagnosis not present

## 2020-07-08 DIAGNOSIS — Z79899 Other long term (current) drug therapy: Secondary | ICD-10-CM | POA: Insufficient documentation

## 2020-07-08 DIAGNOSIS — E1122 Type 2 diabetes mellitus with diabetic chronic kidney disease: Secondary | ICD-10-CM | POA: Insufficient documentation

## 2020-07-08 DIAGNOSIS — I2510 Atherosclerotic heart disease of native coronary artery without angina pectoris: Secondary | ICD-10-CM | POA: Diagnosis not present

## 2020-07-08 DIAGNOSIS — E039 Hypothyroidism, unspecified: Secondary | ICD-10-CM | POA: Diagnosis not present

## 2020-07-08 DIAGNOSIS — W19XXXA Unspecified fall, initial encounter: Secondary | ICD-10-CM | POA: Insufficient documentation

## 2020-07-08 DIAGNOSIS — Z7984 Long term (current) use of oral hypoglycemic drugs: Secondary | ICD-10-CM | POA: Diagnosis not present

## 2020-07-08 DIAGNOSIS — N183 Chronic kidney disease, stage 3 unspecified: Secondary | ICD-10-CM | POA: Diagnosis not present

## 2020-07-08 DIAGNOSIS — M7989 Other specified soft tissue disorders: Secondary | ICD-10-CM | POA: Diagnosis not present

## 2020-07-08 DIAGNOSIS — I129 Hypertensive chronic kidney disease with stage 1 through stage 4 chronic kidney disease, or unspecified chronic kidney disease: Secondary | ICD-10-CM | POA: Diagnosis not present

## 2020-07-08 DIAGNOSIS — I4581 Long QT syndrome: Secondary | ICD-10-CM | POA: Diagnosis not present

## 2020-07-08 DIAGNOSIS — Z7982 Long term (current) use of aspirin: Secondary | ICD-10-CM | POA: Diagnosis not present

## 2020-07-08 DIAGNOSIS — S8261XA Displaced fracture of lateral malleolus of right fibula, initial encounter for closed fracture: Secondary | ICD-10-CM | POA: Diagnosis not present

## 2020-07-08 DIAGNOSIS — E1140 Type 2 diabetes mellitus with diabetic neuropathy, unspecified: Secondary | ICD-10-CM | POA: Insufficient documentation

## 2020-07-08 DIAGNOSIS — Z859 Personal history of malignant neoplasm, unspecified: Secondary | ICD-10-CM | POA: Insufficient documentation

## 2020-07-08 DIAGNOSIS — Z951 Presence of aortocoronary bypass graft: Secondary | ICD-10-CM | POA: Insufficient documentation

## 2020-07-08 DIAGNOSIS — M545 Low back pain, unspecified: Secondary | ICD-10-CM | POA: Diagnosis not present

## 2020-07-08 DIAGNOSIS — R102 Pelvic and perineal pain: Secondary | ICD-10-CM | POA: Diagnosis not present

## 2020-07-08 DIAGNOSIS — Z7722 Contact with and (suspected) exposure to environmental tobacco smoke (acute) (chronic): Secondary | ICD-10-CM | POA: Insufficient documentation

## 2020-07-08 DIAGNOSIS — M25559 Pain in unspecified hip: Secondary | ICD-10-CM | POA: Diagnosis not present

## 2020-07-08 DIAGNOSIS — S8262XA Displaced fracture of lateral malleolus of left fibula, initial encounter for closed fracture: Secondary | ICD-10-CM | POA: Insufficient documentation

## 2020-07-08 DIAGNOSIS — S8991XA Unspecified injury of right lower leg, initial encounter: Secondary | ICD-10-CM | POA: Diagnosis present

## 2020-07-08 DIAGNOSIS — M25561 Pain in right knee: Secondary | ICD-10-CM | POA: Diagnosis not present

## 2020-07-08 DIAGNOSIS — S82891A Other fracture of right lower leg, initial encounter for closed fracture: Secondary | ICD-10-CM

## 2020-07-08 DIAGNOSIS — M1711 Unilateral primary osteoarthritis, right knee: Secondary | ICD-10-CM | POA: Diagnosis not present

## 2020-07-08 LAB — CBC WITH DIFFERENTIAL/PLATELET
Abs Immature Granulocytes: 0.02 10*3/uL (ref 0.00–0.07)
Basophils Absolute: 0.1 10*3/uL (ref 0.0–0.1)
Basophils Relative: 1 %
Eosinophils Absolute: 0.2 10*3/uL (ref 0.0–0.5)
Eosinophils Relative: 3 %
HCT: 33.7 % — ABNORMAL LOW (ref 36.0–46.0)
Hemoglobin: 10.6 g/dL — ABNORMAL LOW (ref 12.0–15.0)
Immature Granulocytes: 0 %
Lymphocytes Relative: 30 %
Lymphs Abs: 2.2 10*3/uL (ref 0.7–4.0)
MCH: 29 pg (ref 26.0–34.0)
MCHC: 31.5 g/dL (ref 30.0–36.0)
MCV: 92.1 fL (ref 80.0–100.0)
Monocytes Absolute: 1 10*3/uL (ref 0.1–1.0)
Monocytes Relative: 14 %
Neutro Abs: 3.7 10*3/uL (ref 1.7–7.7)
Neutrophils Relative %: 52 %
Platelets: 162 10*3/uL (ref 150–400)
RBC: 3.66 MIL/uL — ABNORMAL LOW (ref 3.87–5.11)
RDW: 15.7 % — ABNORMAL HIGH (ref 11.5–15.5)
WBC: 7.2 10*3/uL (ref 4.0–10.5)
nRBC: 0 % (ref 0.0–0.2)

## 2020-07-08 LAB — BASIC METABOLIC PANEL
Anion gap: 5 (ref 5–15)
BUN: 16 mg/dL (ref 8–23)
CO2: 27 mmol/L (ref 22–32)
Calcium: 7.5 mg/dL — ABNORMAL LOW (ref 8.9–10.3)
Chloride: 102 mmol/L (ref 98–111)
Creatinine, Ser: 0.94 mg/dL (ref 0.44–1.00)
GFR, Estimated: >60 mL/min (ref >60)
Glucose, Bld: 166 mg/dL — ABNORMAL HIGH (ref 70–99)
Potassium: 4.2 mmol/L (ref 3.5–5.1)
Sodium: 134 mmol/L — ABNORMAL LOW (ref 135–145)

## 2020-07-08 NOTE — ED Triage Notes (Signed)
Right ankle injury x 2 days

## 2020-07-08 NOTE — ED Provider Notes (Signed)
[facility] EMERGENCY DEPARTMENT Provider Note   CSN: [id number] Arrival date & time: 07/08/20  1116     History Chief Complaint  Patient presents with  . Ankle Pain    Kim Mills is a 80 y.o. female.  HPI   Patient with significant medical history of diabetes, multiple myeloma, normocytic anemia, hypertension, presents with complaint of right ankle pain.  Patient states she had a mechanical fall on Sunday, states she went to get out of bed without her walker place her feet on ground and felt her ankle crack and pop.  She states she landed on her butt.  She denies hitting her head, losing conscious, is not on anticoagulant.  Patient states since then she has been having worse ankle pain, unable to bear weight on it, she also endorses right knee pain.  She states that she has some slight back pain unsure if this is new or old.  She denies  alleviating factors.  Patient denies headaches, change in vision, paresthesia or weakness upper lower extremities, she denies neck or back pain, chest pain or shortness of breath.  Past Medical History:  Diagnosis Date  . Arthritis   . Cancer ([place])   . Cataract   . Chest pain    Associated with weakness and fatigue  . Diabetes mellitus    A1c of 7.4 in 08/2010  . GERD (gastroesophageal reflux disease)   . Glaucoma   . Hyperlipidemia    Lipid profile in 08/2010:190, 121, 48, 118; normal CBC and CMet  . Hypertension    Lipid profile in 08/2010:190, 121, 48, 118; normal CBC and CMet  . Hypothyroidism   . Hypothyroidism   . Obesity     Patient Active Problem List   Diagnosis Date Noted  . Diarrhea 06/24/2020  . Melena 06/24/2020  . Overactive bladder 05/13/2020  . Multiple myeloma ([place]) 10/13/2019  . Goals of care, counseling/discussion 10/13/2019  . Multiple myeloma not having achieved remission ([place]) 10/13/2019  . Monoclonal gammopathy 09/12/2019  . Gross hematuria 07/02/2019  . CKD (chronic kidney disease) stage 3, GFR 30-59  ml/min ([facility]) 04/18/2019  . Vitamin D deficiency 04/17/2019  . B12 deficiency 04/12/2018  . Normocytic anemia 04/12/2018  . Carotid artery disease without cerebral infarction ([place]) 04/12/2018  . Gait disorder 01/11/2018  . Diabetic neuropathy ([place]) 01/10/2018  . Bilateral carotid bruits 11/28/2017  . CAD (coronary artery disease) 11/23/2017  . Elevated troponin   . SVT (supraventricular tachycardia) ([place]) 10/05/2017  . History of colonic polyps   . Encounter for screening colonoscopy 03/13/2014  . Constipation 03/13/2014  . GERD (gastroesophageal reflux disease) 03/13/2014  . Hyperlipidemia   . Type 2 diabetes mellitus ([place])   . OA (osteoarthritis) of knee   . Hypothyroidism   . Chest pain   . Obesity   . Hypertension     Past Surgical History:  Procedure Laterality Date  . CHOLECYSTECTOMY    . COLONOSCOPY N/A 04/01/2014   RMR: Melanosis coli. colonic polyps removed as described above.   . CYSTOSCOPY W/ URETERAL STENT PLACEMENT Right 09/13/2019   Procedure: CYSTOSCOPY WITH RIGHT  RETROGRADE PYELOGRAM; BLADDER BIOPSY;  Surgeon: Cleon Gustin, MD;  Location: [facility];  Service: Urology;  Laterality: Right;  . LEFT HEART CATH AND CORONARY ANGIOGRAPHY N/A 10/07/2017   Procedure: LEFT HEART CATH AND CORONARY ANGIOGRAPHY;  Surgeon: Troy Sine, MD;  Location: [place] [facility];  Service: Cardiovascular;  Laterality: N/A;  . THYROIDECTOMY, PARTIAL  OB History   No obstetric history on file.     Family History  Problem Relation Age of Onset  . Anemia Father   . Arthritis Mother   . Stroke Brother   . Cancer Sister        unknown kind  . Diabetes Sister   . Dementia Sister   . Cancer Niece        Breast  . Cancer Brother   . Healthy Son   . Healthy Son   . Healthy Son   . Healthy Daughter   . Healthy Daughter   . Healthy Daughter   . Sarcoidosis Daughter     Social History   Tobacco Use  . Smoking status: Passive Smoke Exposure - Never Smoker  .  Smokeless tobacco: Never Used  Vaping Use  . Vaping Use: Never used  Substance Use Topics  . Alcohol use: Never  . Drug use: Never    Home Medications Prior to Admission medications   Medication Sig Start Date End Date Taking? Authorizing Provider  acyclovir (ZOVIRAX) 400 MG tablet Take 1 tablet (400 mg total) by mouth 2 (two) times daily. 10/26/19   Derek Jack, MD  amLODipine (NORVASC) 10 MG tablet Take 1 tablet (10 mg total) by mouth daily. 04/20/19   Alycia Rossetti, MD  ASPIRIN 81 PO Take 1 tablet by mouth daily.    [provider]  Blood Glucose Monitoring Suppl (BLOOD GLUCOSE SYSTEM PAK) KIT Please dispense based on patient and insurance preference. Use as directed to monitor FSBS 2x daily. Dx: E11.9. 04/12/18   Alycia Rossetti, MD  fluticasone [facility]) 50 MCG/ACT nasal spray Place 2 sprays into both nostrils daily. 12/18/19   Alycia Rossetti, MD  gabapentin (NEURONTIN) 300 MG capsule TAKE ONE CAPSULE BY MOUTH EVERY NIGHT AT BEDTIME 05/06/20   Lindell Spar, MD  glipiZIDE (GLUCOTROL) 5 MG tablet TAKE ONE TABLET (5MG TOTAL) BY MOUTH TWOTIMES DAILY 03/10/20   Alycia Rossetti, MD  Glucose Blood (BLOOD GLUCOSE TEST STRIPS) STRP Please dispense based on patient and insurance preference. Use as directed to monitor FSBS 2x daily. Dx: E11.9. 04/12/18   Alycia Rossetti, MD  HM LORATADINE 10 MG tablet TAKE ONE TABLET (10MG TOTAL) BY MOUTH DAILY 10/05/19   Alycia Rossetti, MD  Lancets MISC Please dispense based on patient and insurance preference. Use as directed to monitor FSBS 2x daily. Dx: E11.9. 04/12/18   Alycia Rossetti, MD  lenalidomide (REVLIMID) 15 MG capsule Take 1 capsule (15 mg total) by mouth daily. 06/18/20   Derek Jack, MD  lidocaine (XYLOCAINE) 2 % solution  01/09/20   [provider]  losartan-hydrochlorothiazide (HYZAAR) 50-12.5 MG tablet Take 1 tablet by mouth daily. 06/24/20   Lindell Spar, MD  magic mouthwash w/lidocaine SOLN Take 5  mLs by mouth 4 (four) times daily. 01/08/20   Derek Jack, MD  magnesium oxide (MAG-OX) 400 (241.3 Mg) MG tablet Take 1 tablet (400 mg total) by mouth 2 (two) times daily. 03/05/20   Derek Jack, MD  meclizine (ANTIVERT) 25 MG tablet Take 25 mg by mouth every 6 (six) hours as needed for dizziness.     [provider]  metFORMIN (GLUCOPHAGE) 1000 MG tablet TAKE ONE TABLET BY MOUTH TWICE A DAY 10/05/19   [place], Modena Nunnery, MD  metoprolol tartrate (LOPRESSOR) 25 MG tablet TAKE ONE-HALF TABLET (12.5MG TOTAL) BY MOUTH TWO TIMES DAILY 06/19/20   Lindell Spar, MD  omeprazole (PRILOSEC) 40  MG capsule TAKE ONE CAPSULE BY MOUTH DAILY 04/03/20   Alycia Rossetti, MD  potassium chloride SA (KLOR-CON) 20 MEQ tablet Take 1 tablet (20 mEq total) by mouth 2 (two) times daily. 12/28/19   Jacquelin Hawking, NP  prochlorperazine (COMPAZINE) 10 MG tablet Take 1 tablet (10 mg total) by mouth every 6 (six) hours as needed (Nausea or vomiting). 10/26/19   Derek Jack, MD  simvastatin (ZOCOR) 40 MG tablet TAKE ONE TABLET (40MG TOTAL) BY MOUTH BEDTIME 03/10/20   [place], Modena Nunnery, MD  SYNTHROID 137 MCG tablet TAKE ONE TABLET (137MCG TOTAL) BY MOUTH DAILY BEFORE BREAKFAST 06/19/20   Lindell Spar, MD  tolterodine (DETROL LA) 4 MG 24 hr capsule TAKE ONE CAPSULE BY MOUTH DAILY Patient taking differently: Take 4 mg by mouth daily. 04/24/19   Alycia Rossetti, MD  traMADol (ULTRAM) 50 MG tablet TAKE ONE TABLET (50MG TOTAL) BY MOUTH TWO TIMES DAILY AS NEEDED 06/02/20   Derek Jack, MD    Allergies    Patient has no known allergies.  Review of Systems   Review of Systems  Constitutional: Negative for chills and fever.  HENT: Negative for congestion.   Respiratory: Negative for shortness of breath.   Cardiovascular: Negative for chest pain.  Gastrointestinal: Negative for abdominal pain.  Genitourinary: Negative for enuresis.  Musculoskeletal: Negative for back pain.       Right  ankle, right knee, lower back pain  Skin: Negative for rash.  Neurological: Negative for dizziness and headaches.  Hematological: Does not bruise/bleed easily.    Physical Exam Updated Vital Signs BP 136/62   Pulse 82   Temp 98.5 F (36.9 C) (Oral)   Resp 13   Ht 5' 4"  (1.626 m)   Wt 84.8 kg   SpO2 98%   BMI 32.10 kg/m   Physical Exam Vitals and nursing note reviewed.  Constitutional:      General: She is not in acute distress.    Appearance: She is not ill-appearing.  HENT:     Head: Normocephalic and atraumatic.     Nose: No congestion.  Eyes:     Conjunctiva/sclera: Conjunctivae normal.  Cardiovascular:     Rate and Rhythm: Normal rate and regular rhythm.     Pulses: Normal pulses.     Heart sounds: No murmur heard. No friction rub. No gallop.      Comments: Chest was palpated nontender to palpation. Pulmonary:     Effort: No respiratory distress.     Breath sounds: No wheezing, rhonchi or rales.  Abdominal:     Palpations: Abdomen is soft.     Tenderness: There is no abdominal tenderness.  Musculoskeletal:        General: Tenderness present.     Right lower leg: No edema.     Left lower leg: No edema.     Comments: Patient spine was palpated she had slight tenderness to palpation in her lower lumbar region, no step-off or deformities present.   Right lower extremities visualized she has noted pedal edema, she was tender to palpation along her lateral malleolus as well as the lateral aspect of her right knee, she has full range of motion at her toes ankle and knee, she neurovascular fully intact.  Patient is full range of motion in the upper lower extremities, neurovascular intact.  Skin:    General: Skin is warm and dry.  Neurological:     Mental Status: She is alert.  Psychiatric:  Mood and Affect: Mood normal.     ED Results / Procedures / Treatments   Labs (all labs ordered are listed, but only abnormal results are displayed) Labs Reviewed   BASIC METABOLIC PANEL - Abnormal; Notable for the following components:      Result Value   Sodium 134 (*)    Glucose, Bld 166 (*)    Calcium 7.5 (*)    All other components within normal limits  CBC WITH DIFFERENTIAL/PLATELET - Abnormal; Notable for the following components:   RBC 3.66 (*)    Hemoglobin 10.6 (*)    HCT 33.7 (*)    RDW 15.7 (*)    All other components within normal limits    EKG EKG Interpretation  Date/Time:  Tuesday Jul 08 2020 14:53:40 EDT Ventricular Rate:  87 PR Interval:  134 QRS Duration: 85 QT Interval:  416 QTC Calculation: 501 R Axis:   8 Text Interpretation: Sinus rhythm Low voltage, precordial leads Abnormal R-wave progression, early transition Prolonged QT interval Baseline wander in lead(s) II III aVF new prolonged QT Confirmed by Fredia Sorrow [phone]) on 07/08/2020 3:26:15 PM   Radiology DG Ankle Complete Right  Result Date: 07/08/2020 CLINICAL DATA:  Injury. EXAM: RIGHT ANKLE - COMPLETE 3+ VIEW COMPARISON:  No recent. FINDINGS: Diffuse severe soft tissue swelling. Displaced fractures of the tip of the medial malleolus and the distal fibula noted. Diffuse osteopenia and degenerative change. IMPRESSION: Diffuse severe soft tissue swelling. Displaced fractures of the tip of the malleolus and the distal fibula noted. Electronically Signed   By: Marcello Moores  Register   On: 07/08/2020 12:42   CT Lumbar Spine Wo Contrast  Result Date: 07/08/2020 CLINICAL DATA:  Fall 2 days ago.  Back pain EXAM: CT LUMBAR SPINE WITHOUT CONTRAST TECHNIQUE: Multidetector CT imaging of the lumbar spine was performed without intravenous contrast administration. Multiplanar CT image reconstructions were also generated. COMPARISON:  None. FINDINGS: Segmentation: Normal Alignment: Normal sagittal alignment.  Mild scoliosis Vertebrae: Negative for fracture Paraspinal and other soft tissues: Atherosclerotic aorta without aneurysm. No paraspinous mass, adenopathy, or edema. Disc  levels: T12-L1: Disc degeneration with central and left-sided spurring. Bilateral facet degeneration. Mild subarticular and foraminal stenosis on the left. L1-2: Disc degeneration and facet degeneration. Negative for stenosis. L2-3: Diffuse bulging of the disc with moderate facet hypertrophy. Mild spinal stenosis. Moderate subarticular and foraminal stenosis on the left. L3-4: Moderate to advanced disc degeneration with disc space narrowing and gas in the disc space. Diffuse endplate spurring. Moderate facet degeneration bilaterally right greater than left. Moderate to severe subarticular stenosis bilaterally, right greater than left. L4-5: Disc degeneration with diffuse disc bulging and bilateral facet hypertrophy. Moderate spinal stenosis. Moderate to severe subarticular and foraminal stenosis bilaterally. L5-S1: Disc degeneration with diffuse endplate spurring and bilateral facet hypertrophy. Moderate subarticular stenosis bilaterally. IMPRESSION: Negative for lumbar fracture Atherosclerotic aorta Diffuse lumbar spondylosis with spinal and foraminal stenosis as described above. Electronically Signed   By: Franchot Gallo M.D.   On: 07/08/2020 14:52   DG Pelvis Portable  Result Date: 07/08/2020 CLINICAL DATA:  Pain following recent fall EXAM: PORTABLE PELVIS 1-2 VIEWS COMPARISON:  May 26, 2020 FINDINGS: There is no evidence of pelvic fracture or dislocation. There is moderate symmetric narrowing of each hip joint. No erosive change. IMPRESSION: Moderate symmetric narrowing of each hip joint. No acute fracture or dislocation. Electronically Signed   By: Lowella Grip III M.D.   On: 07/08/2020 14:54   DG Knee Complete 4 Views Right  Result  Date: 07/08/2020 CLINICAL DATA:  Pain following fall EXAM: RIGHT KNEE - COMPLETE 4+ VIEW COMPARISON:  None. FINDINGS: Frontal, lateral, and bilateral oblique views were obtained. No fracture or dislocation. No appreciable joint effusion. There is moderate generalized  joint space narrowing, most severe laterally and in the patellofemoral joint region. There is spurring laterally. There are spurs arising from the anterior aspect of the patella superiorly and inferiorly. IMPRESSION: Osteoarthritic change, generalized but most severe laterally. No fracture, dislocation, or joint effusion. Spurs along the anterior patella may represent distal quadriceps and proximal patellar tendinosis. Electronically Signed   By: Lowella Grip III M.D.   On: 07/08/2020 14:41    Procedures Procedures   Medications Ordered in ED Medications - No data to display  ED Course  I have reviewed the triage vital signs and the nursing notes.  Pertinent labs & imaging results that were available during my care of the patient were reviewed by me and considered in my medical decision making (see chart for details).    MDM Rules/Calculators/A&P                         Initial impression-patient presents with right ankle pain.  She is alert, does not appear to be in acute distress, vital signs reassuring.  Due to tenderness to palpation on her lower back, knee and ankle will obtain imaging for further evaluation.  Patient has a history of multiple myeloma we will also check basic lab work.  Work-up-CBC shows normocytic anemia with hemoglobin 10.6 appears to be baseline for patient.  BMP shows slight hyponatremia of 134, slight hyperglycemia 166, hypokalemia of 7.5 appears to be above her baseline.  Portable pelvis negative for acute findings, lumbar spine negative for acute findings, shows diffuse lumbar spondylosis, x-ray of right knee negative for acute findings, does show osteoarthritis most severe in the lateral aspect.  X-ray of right ankle shows diffuse soft tissue swelling with displaced fracture of the tip of the malleus and the distal fibula.  EKG sinus rhythm without signs of ischemia does show a slightly prolonged QT.  Reassessment-patient was reassessed and updated on imaging  and lab work, vital signs remained stable, patient has no complaints, they are agreeable for discharge.  Rule out- I have low suspicion for intracranial head bleed or CVA as patient denies headaches, change in vision, paresthesia or weakness in her lower extremities, there is no focal deficits present on exam.  Low suspicion for spinal cord abnormality or spinal fracture as there is no deformities present my exam, patient is moving all 4 extremities, imaging negative for acute findings.  Low suspicion for rib fracture or pneumothorax as lung sounds are clear bilaterally, chest is nontender to palpation.  Low suspicion hip fracture as there is no internal/external rotation, no leg shortening, x-rays negative for acute findings.  Low suspicion for compartment syndrome of the right lower leg is neurovascularly intact.  Plan-  1.  Right ankle pain-secondary due to fractures of the tip of tip of the medial malleolus as well as the distal fibula.  Will place in a cam boot, make her nonweightbearing, follow-up with orthopedics for further evaluation.  2.  Prolonged QT-no signs of arrhythmias, will have her follow-up with PCP for reevaluation.   Vital signs have remained stable, no indication for hospital admission.  Patient discussed with attending and they agreed with assessment and plan.  Patient given at home care as well strict return precautions.  Patient verbalized  that they understood agreed to said plan.   Final Clinical Impression(s) / ED Diagnoses Final diagnoses:  Closed fracture of right ankle, initial encounter  Prolonged Q-T interval on ECG    Rx / DC Orders ED Discharge Orders    None       Marcello Fennel, PA-C 07/08/20 1602    Fredia Sorrow, MD 07/10/20 [phone]

## 2020-07-08 NOTE — Discharge Instructions (Addendum)
Imaging reveals that you have a displaced fracture of the tip of the medial malleolus and the distal fibula of your right ankle.  I placed you in a boot please remain nonweightbearing, please keep the leg elevated when not use, you may apply ice to the area and take over-the-counter pain medication like ibuprofen Tylenol every 6 as needed.  I would like you to follow-up with orthopedic surgery for further evaluation given contact information above please call  There is also noted that your QT interval was slightly elongated I would like you to follow-up with your PCP for further evaluation.  Come back to the emergency department if you develop chest pain, shortness of breath, severe abdominal pain, uncontrolled nausea, vomiting, diarrhea.

## 2020-07-11 ENCOUNTER — Ambulatory Visit: Payer: Medicare Other

## 2020-07-11 ENCOUNTER — Other Ambulatory Visit: Payer: Self-pay

## 2020-07-11 ENCOUNTER — Ambulatory Visit (INDEPENDENT_AMBULATORY_CARE_PROVIDER_SITE_OTHER): Payer: Medicare Other | Admitting: Orthopedic Surgery

## 2020-07-11 ENCOUNTER — Encounter: Payer: Self-pay | Admitting: Orthopedic Surgery

## 2020-07-11 VITALS — BP 170/99 | HR 94 | Ht 64.0 in | Wt 187.0 lb

## 2020-07-11 DIAGNOSIS — S8261XA Displaced fracture of lateral malleolus of right fibula, initial encounter for closed fracture: Secondary | ICD-10-CM | POA: Diagnosis not present

## 2020-07-11 MED ORDER — TRAMADOL HCL 50 MG PO TABS: 50.0000 mg | ORAL_TABLET | Freq: Three times a day (TID) | ORAL | 0 refills | Status: DC | PRN

## 2020-07-11 NOTE — Patient Instructions (Signed)

## 2020-07-11 NOTE — Progress Notes (Signed)
New Patient Visit  Assessment: Kim Mills is a 80 y.o. female with the following: Right distal fibula fracture, minimally displaced  Plan: Patient presented to clinic for evaluation for a right ankle fracture.  X-rays in the emergency department were concerning for a functional bimalleolar injury.  On physical exam, she does have tenderness over the medial malleolus.  Reviewed radiographs in clinic today demonstrate that the mortise is intact.  I am not concerned about an unstable bimalleolar equivalent injury.  As a result, we will plan to treat this injury in a cast.  She will likely require at least 2 cast, with transition to a walking boot at that time.  She is to remain nonweightbearing.  Cast instructions were provided.  Cast application - right short leg cast   Verbal consent was obtained and the correct extremity was identified. A well padded, appropriately molded short leg cast was applied to the right ankle.  As the cast hardened, a bimalleolar mold was applied. Toes remained warm and well perfused.   There were no sharp edges Patient tolerated the procedure well Cast care instructions were provided    Follow-up: Return in about 2 weeks (around 07/25/2020).  Subjective:  Chief Complaint  Patient presents with  . Ankle Injury    07/06/20 right knee gave out and caused injury to right ankle     History of Present Illness: Kim Mills is a 80 y.o. female who presents for evaluation of a right ankle injury.  Earlier this week, she fell twisted her ankle.  She presented to the emergency department, was noted to have a distal fibula fracture.  She was given a walking boot, and instructed to remain nonweightbearing.  She has done well with instructions thus far.  She has primarily laid in bed.  She has been taking Tylenol, and some tramadol to assist with her pain.  This is been helping.  No other injuries are noted in the fall.   Review of Systems: No fevers or  chills No numbness or tingling No chest pain No shortness of breath No bowel or bladder dysfunction No GI distress No headaches   Medical History:  Past Medical History:  Diagnosis Date  . Arthritis   . Cancer ([place])   . Cataract   . Chest pain    Associated with weakness and fatigue  . Diabetes mellitus    A1c of 7.4 in 08/2010  . GERD (gastroesophageal reflux disease)   . Glaucoma   . Hyperlipidemia    Lipid profile in 08/2010:190, 121, 48, 118; normal CBC and CMet  . Hypertension    Lipid profile in 08/2010:190, 121, 48, 118; normal CBC and CMet  . Hypothyroidism   . Hypothyroidism   . Obesity     Past Surgical History:  Procedure Laterality Date  . CHOLECYSTECTOMY    . COLONOSCOPY N/A 04/01/2014   RMR: Melanosis coli. colonic polyps removed as described above.   . CYSTOSCOPY W/ URETERAL STENT PLACEMENT Right 09/13/2019   Procedure: CYSTOSCOPY WITH RIGHT  RETROGRADE PYELOGRAM; BLADDER BIOPSY;  Surgeon: Cleon Gustin, MD;  Location: [facility];  Service: Urology;  Laterality: Right;  . LEFT HEART CATH AND CORONARY ANGIOGRAPHY N/A 10/07/2017   Procedure: LEFT HEART CATH AND CORONARY ANGIOGRAPHY;  Surgeon: Troy Sine, MD;  Location: [facility];  Service: Cardiovascular;  Laterality: N/A;  . THYROIDECTOMY, PARTIAL      Family History  Problem Relation Age of Onset  . Anemia Father   .  Arthritis Mother   . Stroke Brother   . Cancer Sister        unknown kind  . Diabetes Sister   . Dementia Sister   . Cancer Niece        Breast  . Cancer Brother   . Healthy Son   . Healthy Son   . Healthy Son   . Healthy Daughter   . Healthy Daughter   . Healthy Daughter   . Sarcoidosis Daughter    Social History   Tobacco Use  . Smoking status: Passive Smoke Exposure - Never Smoker  . Smokeless tobacco: Never Used  Vaping Use  . Vaping Use: Never used  Substance Use Topics  . Alcohol use: Never  . Drug use: Never    No Known Allergies  Current Meds   Medication Sig  . acyclovir (ZOVIRAX) 400 MG tablet Take 1 tablet (400 mg total) by mouth 2 (two) times daily.  Marland Kitchen amLODipine (NORVASC) 10 MG tablet Take 1 tablet (10 mg total) by mouth daily.  . ASPIRIN 81 PO Take 1 tablet by mouth daily.  . Blood Glucose Monitoring Suppl (BLOOD GLUCOSE SYSTEM PAK) KIT Please dispense based on patient and insurance preference. Use as directed to monitor FSBS 2x daily. Dx: E11.9.  Marland Kitchen fluticasone (FLONASE) 50 MCG/ACT nasal spray Place 2 sprays into both nostrils daily.  Marland Kitchen gabapentin (NEURONTIN) 300 MG capsule TAKE ONE CAPSULE BY MOUTH EVERY NIGHT AT BEDTIME  . glipiZIDE (GLUCOTROL) 5 MG tablet TAKE ONE TABLET (5MG TOTAL) BY MOUTH TWOTIMES DAILY  . Glucose Blood (BLOOD GLUCOSE TEST STRIPS) STRP Please dispense based on patient and insurance preference. Use as directed to monitor FSBS 2x daily. Dx: E11.9.  . HM LORATADINE 10 MG tablet TAKE ONE TABLET (10MG TOTAL) BY MOUTH DAILY  . Lancets MISC Please dispense based on patient and insurance preference. Use as directed to monitor FSBS 2x daily. Dx: E11.9.  . lenalidomide (REVLIMID) 15 MG capsule Take 1 capsule (15 mg total) by mouth daily.  Marland Kitchen lidocaine (XYLOCAINE) 2 % solution   . losartan-hydrochlorothiazide (HYZAAR) 50-12.5 MG tablet Take 1 tablet by mouth daily.  . magic mouthwash w/lidocaine SOLN Take 5 mLs by mouth 4 (four) times daily.  . magnesium oxide (MAG-OX) 400 (241.3 Mg) MG tablet Take 1 tablet (400 mg total) by mouth 2 (two) times daily.  . meclizine (ANTIVERT) 25 MG tablet Take 25 mg by mouth every 6 (six) hours as needed for dizziness.   . metFORMIN (GLUCOPHAGE) 1000 MG tablet TAKE ONE TABLET BY MOUTH TWICE A DAY  . metoprolol tartrate (LOPRESSOR) 25 MG tablet TAKE ONE-HALF TABLET (12.5MG TOTAL) BY MOUTH TWO TIMES DAILY  . omeprazole (PRILOSEC) 40 MG capsule TAKE ONE CAPSULE BY MOUTH DAILY  . potassium chloride SA (KLOR-CON) 20 MEQ tablet Take 1 tablet (20 mEq total) by mouth 2 (two) times daily.  .  prochlorperazine (COMPAZINE) 10 MG tablet Take 1 tablet (10 mg total) by mouth every 6 (six) hours as needed (Nausea or vomiting).  . simvastatin (ZOCOR) 40 MG tablet TAKE ONE TABLET (40MG TOTAL) BY MOUTH BEDTIME  . SYNTHROID 137 MCG tablet TAKE ONE TABLET (137MCG TOTAL) BY MOUTH DAILY BEFORE BREAKFAST  . tolterodine (DETROL LA) 4 MG 24 hr capsule TAKE ONE CAPSULE BY MOUTH DAILY (Patient taking differently: Take 4 mg by mouth daily.)  . traMADol (ULTRAM) 50 MG tablet TAKE ONE TABLET (50MG TOTAL) BY MOUTH TWO TIMES DAILY AS NEEDED  . traMADol (ULTRAM) 50 MG tablet Take 1  tablet (50 mg total) by mouth every 8 (eight) hours as needed for severe pain.    Objective: BP (!) 170/99   Pulse 94   Ht _0  (1.626 m)   Wt 187 lb (84.8 kg)   BMI 32.10 kg/m   Physical Exam:  General: Alert and oriented.  No acute distress.  Seated in a wheelchair. Gait: Unable to ambulate.  Evaluation of the right ankle demonstrates significant swelling over the anterior and lateral aspect of the ankle.  Mild swelling over the medial ankle.  Ecchymosis is present throughout the ankle.  She is able to actively dorsiflex her ankle as well as her great toe.  2+ DP pulse.  Toes are warm and well-perfused.  IMAGING: I personally ordered and reviewed the following images  Right ankle x-rays, as well as post casting x-rays were obtained in clinic today.  He has a minimally displaced distal fibula fracture, without medial clear space widening.  After application of the cast, there has been some improvement in the displacement of the distal fibula fracture.  No syndesmotic disruption.  The mortise remains intact.  Impression: Right distal fibula fracture, minimally displaced and in acceptable alignment.   New Medications:  Meds ordered this encounter  Medications  . traMADol (ULTRAM) 50 MG tablet    Sig: Take 1 tablet (50 mg total) by mouth every 8 (eight) hours as needed for severe pain.    Dispense:  20 tablet     Refill:  0      Mordecai Rasmussen, MD  07/11/2020 12:37 PM

## 2020-07-16 ENCOUNTER — Encounter: Payer: Self-pay | Admitting: *Deleted

## 2020-07-17 NOTE — Progress Notes (Signed)
 [place] [address] [place], [place] [address]   CLINIC:  Medical Oncology/Hematology  PCP:  Patel, Rutwik K, MD [address] / [place] [address] [phone]   REASON FOR VISIT:  Follow-up for multiple myeloma & normocytic anemia  PRIOR THERAPY: Velcade x 9 cycles from 10/29/2019 to 05/02/2020  NGS Results: not done  CURRENT THERAPY: Revlimid 2/3 weeks  BRIEF ONCOLOGIC HISTORY:  Oncology History  Multiple myeloma not having achieved remission (HCC)  10/13/2019 Initial Diagnosis   Multiple myeloma not having achieved remission (HCC)    10/29/2019 -  Chemotherapy    Patient is on Treatment Plan: MYELOMA NON-TRANSPLANT CANDIDATES VRD WEEKLY Q21D         CANCER STAGING: Cancer Staging No matching staging information was found for the patient.  INTERVAL HISTORY:  [name], [place] 79 y.o. female, returns for routine follow-up of her multiple myeloma & normocytic anemia. [place] was last seen on 06/23/2020.   Today she reports feeling well. She fractured her right ankle over memorial day weekend after falling backwards when trying to get out of the bed. This is the first fracture she has had. She denies any severe weakness prior to this fracture. She is not currently taking Revlimid; it was held last visit due to diarrhea.   REVIEW OF SYSTEMS:  Review of Systems  Constitutional:  Positive for appetite change (75%) and fatigue (50%).  All other systems reviewed and are negative.  PAST MEDICAL/SURGICAL HISTORY:  Past Medical History:  Diagnosis Date   Arthritis    Cancer (HCC)    Cataract    Chest pain    Associated with weakness and fatigue   Diabetes mellitus    A1c of 7.4 in 08/2010   GERD (gastroesophageal reflux disease)    Glaucoma    Hyperlipidemia    Lipid profile in 08/2010:190, 121, 48, 118; normal CBC and CMet   Hypertension    Lipid profile in 08/2010:190, 121, 48, 118; normal CBC and CMet   Hypothyroidism     Hypothyroidism    Obesity    Past Surgical History:  Procedure Laterality Date   CHOLECYSTECTOMY     COLONOSCOPY N/[place] 04/01/2014   RMR: Melanosis coli. colonic polyps removed as described above.    CYSTOSCOPY W/ URETERAL STENT PLACEMENT Right 09/13/2019   Procedure: CYSTOSCOPY WITH RIGHT  RETROGRADE PYELOGRAM; BLADDER BIOPSY;  Surgeon: McKenzie, Patrick L, MD;  Location: [facility];  Service: Urology;  Laterality: Right;   LEFT HEART CATH AND CORONARY ANGIOGRAPHY N/[place] 10/07/2017   Procedure: LEFT HEART CATH AND CORONARY ANGIOGRAPHY;  Surgeon: Kelly, Thomas A, MD;  Location: [facility];  Service: Cardiovascular;  Laterality: N/[place];   THYROIDECTOMY, PARTIAL      SOCIAL HISTORY:  Social History   Socioeconomic History   Marital status: Widowed    Spouse name: Not on file   Number of children: 7   Years of education: Not on file   Highest education level: Not on file  Occupational History    Employer: RETIRED  Tobacco Use   Smoking status: Passive Smoke Exposure - Never Smoker   Smokeless tobacco: Never  Vaping Use   Vaping Use: Never used  Substance and Sexual Activity   Alcohol use: Never   Drug use: Never   Sexual activity: Not Currently  Other Topics Concern   Not on file  Social History Narrative   Not on file   Social Determinants of Health   Financial   Resource Strain: Not on file  Food Insecurity: Not on file  Transportation Needs: No Transportation Needs   Lack of Transportation (Medical): No   Lack of Transportation (Non-Medical): No  Physical Activity: Inactive   Days of Exercise per Week: 0 days   Minutes of Exercise per Session: 0 min  Stress: Not on file  Social Connections: Not on file  Intimate Partner Violence: Not At Risk   Fear of Current or Ex-Partner: No   Emotionally Abused: No   Physically Abused: No   Sexually Abused: No    FAMILY HISTORY:  Family History  Problem Relation Age of Onset   Anemia Father    Arthritis Mother    Stroke Brother     Cancer Sister        unknown kind   Diabetes Sister    Dementia Sister    Cancer Niece        Breast   Cancer Brother    Healthy Son    Healthy Son    Healthy Son    Healthy Daughter    Healthy Daughter    Healthy Daughter    Sarcoidosis Daughter     CURRENT MEDICATIONS:  Current Outpatient Medications  Medication Sig Dispense Refill   acyclovir (ZOVIRAX) 400 MG tablet Take 1 tablet (400 mg total) by mouth 2 (two) times daily. 60 tablet 6   amLODipine (NORVASC) 10 MG tablet Take 1 tablet (10 mg total) by mouth daily. 90 tablet 3   ASPIRIN 81 PO Take 1 tablet by mouth daily.     Blood Glucose Monitoring Suppl (BLOOD GLUCOSE SYSTEM PAK) KIT Please dispense based on patient and insurance preference. Use as directed to monitor FSBS 2x daily. Dx: E11.9. 1 each 1   fluticasone (FLONASE) 50 MCG/ACT nasal spray Place 2 sprays into both nostrils daily. 16 g 6   gabapentin (NEURONTIN) 300 MG capsule TAKE ONE CAPSULE BY MOUTH EVERY NIGHT AT BEDTIME 90 capsule 3   glipiZIDE (GLUCOTROL) 5 MG tablet TAKE ONE TABLET (5MG TOTAL) BY MOUTH TWOTIMES DAILY 180 tablet 3   Glucose Blood (BLOOD GLUCOSE TEST STRIPS) STRP Please dispense based on patient and insurance preference. Use as directed to monitor FSBS 2x daily. Dx: E11.9. 100 each 11   HM LORATADINE 10 MG tablet TAKE ONE TABLET (10MG TOTAL) BY MOUTH DAILY 30 tablet 0   Lancets MISC Please dispense based on patient and insurance preference. Use as directed to monitor FSBS 2x daily. Dx: E11.9. 100 each 11   lenalidomide (REVLIMID) 15 MG capsule Take 1 capsule (15 mg total) by mouth daily. 14 capsule 0   lidocaine (XYLOCAINE) 2 % solution      losartan-hydrochlorothiazide (HYZAAR) 50-12.5 MG tablet Take 1 tablet by mouth daily. 90 tablet 0   magic mouthwash w/lidocaine SOLN Take 5 mLs by mouth 4 (four) times daily. 450 mL 0   magnesium oxide (MAG-OX) 400 (241.3 Mg) MG tablet Take 1 tablet (400 mg total) by mouth 2 (two) times daily. 60 tablet 0    meclizine (ANTIVERT) 25 MG tablet Take 25 mg by mouth every 6 (six) hours as needed for dizziness.      metFORMIN (GLUCOPHAGE) 1000 MG tablet TAKE ONE TABLET BY MOUTH TWICE [place] DAY 60 tablet 3   metoprolol tartrate (LOPRESSOR) 25 MG tablet TAKE ONE-HALF TABLET (12.5MG TOTAL) BY MOUTH TWO TIMES DAILY 90 tablet 3   omeprazole (PRILOSEC) 40 MG capsule TAKE ONE CAPSULE BY MOUTH DAILY 90 capsule 3   potassium chloride SA (  KLOR-CON) 20 MEQ tablet Take 1 tablet (20 mEq total) by mouth 2 (two) times daily. 10 tablet 0   prochlorperazine (COMPAZINE) 10 MG tablet Take 1 tablet (10 mg total) by mouth every 6 (six) hours as needed (Nausea or vomiting). 30 tablet 1   simvastatin (ZOCOR) 40 MG tablet TAKE ONE TABLET (40MG TOTAL) BY MOUTH BEDTIME 90 tablet 3   SYNTHROID 137 MCG tablet TAKE ONE TABLET (137MCG TOTAL) BY MOUTH DAILY BEFORE BREAKFAST 30 tablet 0   tolterodine (DETROL LA) 4 MG 24 hr capsule TAKE ONE CAPSULE BY MOUTH DAILY (Patient taking differently: Take 4 mg by mouth daily.) 90 capsule 3   traMADol (ULTRAM) 50 MG tablet TAKE ONE TABLET (50MG TOTAL) BY MOUTH TWO TIMES DAILY AS NEEDED 60 tablet 0   traMADol (ULTRAM) 50 MG tablet Take 1 tablet (50 mg total) by mouth every 8 (eight) hours as needed for severe pain. 20 tablet 0   No current facility-administered medications for this visit.    ALLERGIES:  No Known Allergies  PHYSICAL EXAM:  Performance status (ECOG): 3 - Symptomatic, >50% confined to bed  There were no vitals filed for this visit. Wt Readings from Last 3 Encounters:  07/11/20 187 lb (84.8 kg)  07/08/20 187 lb (84.8 kg)  06/24/20 177 lb 1.9 oz (80.3 kg)   Physical Exam Vitals reviewed.  Constitutional:      Appearance: Normal appearance.     Comments: In wheelchair  Cardiovascular:     Rate and Rhythm: Normal rate and regular rhythm.     Pulses: Normal pulses.     Heart sounds: Normal heart sounds.  Pulmonary:     Effort: Pulmonary effort is normal.     Breath sounds:  Normal breath sounds.  Musculoskeletal:        General: Signs of injury (right ankle fracture) present.     Left lower leg: No edema.  Neurological:     General: No focal deficit present.     Mental Status: She is alert and oriented to person, place, and time.  Psychiatric:        Mood and Affect: Mood normal.        Behavior: Behavior normal.     LABORATORY DATA:  I have reviewed the labs as listed.  CBC Latest Ref Rng & Units 07/08/2020 06/23/2020 05/26/2020  WBC 4.0 - 10.5 Mills/uL 7.2 6.3 7.4  Hemoglobin 12.0 - 15.0 g/dL 10.6([place]) 11.4([place]ills) 11.3([place]ills)  Hematocrit 36.0 - 46.0 % 33.7([place]ills) 36.1 36.7  Platelets 150 - 400 [place]/uL 162 273 181   CMP Latest Ref Rng & Units 07/08/2020 06/23/2020 05/26/2020  Glucose 70 - 99 mg/dL 166(H) 103(H) 87  BUN 8 - 23 mg/dL 16 13 13  Creatinine 0.44 - 1.00 mg/dL 0.94 0.70 0.72  Sodium 135 - 145 mmol/Mills 134([place]ills) 143 141  Potassium 3.5 - 5.1 mmol/Mills 4.2 3.4([place]ills) 4.2  Chloride 98 - 111 mmol/Mills 102 107 109  CO2 22 - 32 mmol/Mills 27 26 25  Calcium 8.9 - 10.3 mg/dL 7.5(Mills) 7.0(Mills) 7.8(Mills)  Total Protein 6.5 - 8.1 g/dL - 6.9 6.7  Total Bilirubin 0.3 - 1.2 mg/dL - 1.2 0.9  Alkaline Phos 38 - 126 U/Mills - 61 59  AST 15 - 41 U/Mills - 19 13(Mills)  ALT 0 - 44 U/[place]ills - 18 13    DIAGNOSTIC IMAGING:  I have independently reviewed the scans and discussed with the patient. DG Ankle Complete Right  Result Date: 07/15/2020 Right ankle x-rays, as well as post   casting x-rays were obtained in clinic today.  He has [place] minimally displaced distal fibula fracture, without medial clear space widening.  After application of the cast, there has been some improvement in the displacement of the distal fibula fracture.  No syndesmotic disruption.  The mortise remains intact.   Impression: Right distal fibula fracture, minimally displaced and in acceptable alignment.  DG Ankle Complete Right  Result Date: 07/15/2020 Right ankle x-rays, as well as post casting x-rays were obtained in clinic today.  He has [place] minimally  displaced distal fibula fracture, without medial clear space widening.  After application of the cast, there has been some improvement in the displacement of the distal fibula fracture.  No syndesmotic disruption.  The mortise remains intact.   Impression: Right distal fibula fracture, minimally displaced and in acceptable alignment.  DG Ankle Complete Right  Result Date: 07/08/2020 CLINICAL DATA:  Injury. EXAM: RIGHT ANKLE - COMPLETE 3+ VIEW COMPARISON:  No recent. FINDINGS: Diffuse severe soft tissue swelling. Displaced fractures of the tip of the medial malleolus and the distal fibula noted. Diffuse osteopenia and degenerative change. IMPRESSION: Diffuse severe soft tissue swelling. Displaced fractures of the tip of the malleolus and the distal fibula noted. Electronically Signed   By: Marcello Moores  Register   On: 07/08/2020 12:42   CT Lumbar Spine Wo Contrast  Result Date: 07/08/2020 CLINICAL DATA:  Fall 2 days ago.  Back pain EXAM: CT LUMBAR SPINE WITHOUT CONTRAST TECHNIQUE: Multidetector CT imaging of the lumbar spine was performed without intravenous contrast administration. Multiplanar CT image reconstructions were also generated. COMPARISON:  None. FINDINGS: Segmentation: Normal Alignment: Normal sagittal alignment.  Mild scoliosis Vertebrae: Negative for fracture Paraspinal and other soft tissues: Atherosclerotic aorta without aneurysm. No paraspinous mass, adenopathy, or edema. Disc levels: T12-L1: Disc degeneration with central and left-sided spurring. Bilateral facet degeneration. Mild subarticular and foraminal stenosis on the left. L1-2: Disc degeneration and facet degeneration. Negative for stenosis. L2-3: Diffuse bulging of the disc with moderate facet hypertrophy. Mild spinal stenosis. Moderate subarticular and foraminal stenosis on the left. L3-4: Moderate to advanced disc degeneration with disc space narrowing and gas in the disc space. Diffuse endplate spurring. Moderate facet degeneration  bilaterally right greater than left. Moderate to severe subarticular stenosis bilaterally, right greater than left. L4-5: Disc degeneration with diffuse disc bulging and bilateral facet hypertrophy. Moderate spinal stenosis. Moderate to severe subarticular and foraminal stenosis bilaterally. L5-S1: Disc degeneration with diffuse endplate spurring and bilateral facet hypertrophy. Moderate subarticular stenosis bilaterally. IMPRESSION: Negative for lumbar fracture Atherosclerotic aorta Diffuse lumbar spondylosis with spinal and foraminal stenosis as described above. Electronically Signed   By: Franchot Gallo M.D.   On: 07/08/2020 14:52   DG Pelvis Portable  Result Date: 07/08/2020 CLINICAL DATA:  Pain following recent fall EXAM: PORTABLE PELVIS 1-2 VIEWS COMPARISON:  May 26, 2020 FINDINGS: There is no evidence of pelvic fracture or dislocation. There is moderate symmetric narrowing of each hip joint. No erosive change. IMPRESSION: Moderate symmetric narrowing of each hip joint. No acute fracture or dislocation. Electronically Signed   By: Lowella Grip III M.D.   On: 07/08/2020 14:54   DG Knee Complete 4 Views Right  Result Date: 07/08/2020 CLINICAL DATA:  Pain following fall EXAM: RIGHT KNEE - COMPLETE 4+ VIEW COMPARISON:  None. FINDINGS: Frontal, lateral, and bilateral oblique views were obtained. No fracture or dislocation. No appreciable joint effusion. There is moderate generalized joint space narrowing, most severe laterally and in the patellofemoral joint region. There is spurring  laterally. There are spurs arising from the anterior aspect of the patella superiorly and inferiorly. IMPRESSION: Osteoarthritic change, generalized but most severe laterally. No fracture, dislocation, or joint effusion. Spurs along the anterior patella may represent distal quadriceps and proximal patellar tendinosis. Electronically Signed   By: William  Woodruff III M.D.   On: 07/08/2020 14:41     ASSESSMENT:  1.   Normocytic anemia: -Patient seen at the request of Dr. Bannock for further work-up and management of normocytic anemia. -Recent CBC on 08/21/2019 shows hemoglobin 6.9 with MCV of 88.3.  White count and platelets are normal.  No history of CKD.  Denies any bleeding per rectum or melena. Last colonoscopy on 04/01/2014 shows diffusely pigmented rectal mucosa consistent with melanosis [name]  Diffusely pigmented colonic mucosa, 2 diminutive polyps in the mid ascending segment, otherwise the remainder of the colon mucosa was normal.  Biopsy consistent with tubular adenoma. -CT renal study on 05/11/2019 shows normal-sized spleen with normal liver.  No other abnormal adenopathy. -Denies any prior history of blood transfusion.  She is currently taking iron tablet twice daily.   2.  IgG lambda plasma cell myeloma: -Skeletal survey on 09/12/2019 shows diffuse faint lucencies in the skull, bilateral femurs. -Bone marrow biopsy on 09/27/2019 shows 70% plasma cells in hypercellular marrow.  Chromosome analysis was 46, XX. FISH panel was normal.  However quality of specimen is compromised. -LDH normal, beta-2 microglobulin 2.9.  M spike was 2.1 g.  Kappa light chains 19.7, lambda light chains 14.3 with ratio of 1.38. -24-hour urine shows nonnephrotic range proteinuria.  Urine immunofixation was positive.   PLAN:  1.  IgG lambda plasma cell myeloma: - She was taking Revlimid 15 mg 2 weeks on 1 week off.  We held it at last visit due to diarrhea. - Diarrhea has improved. - She sustained right ankle fracture on 07/11/2020 at the level of fibula.  She was trying to get down from the bed and fell. - We reviewed myeloma labs from 06/23/2020.  M spike was negative.  Immunofixation was normal.  Free light chain ratio was also normal.  Reviewed labs from today which showed normal renal function and calcium. - We will start her back on Revlimid at 10 mg 2 weeks on/1 week off.  We have held her Decadron  because of poor control of sugars. - RTC 4 weeks for follow-up.   2.  Diabetes: - Continue glipizide and metformin.   3.  Hypertension: - Continue amlodipine and HCTZ.   4.  Hypothyroidism: - Continue Synthroid daily.   5.  Diarrhea: - Diarrhea has improved since we stopped Revlimid. - She does not report any diarrhea at this time.   6.  Left knee pain: - Continue tramadol 50 mg every 12 hours as needed.   7.  Infection prophylaxis: - Continue acyclovir 400 mg twice daily and aspirin 81 mg daily.   8.  Bone protection: - She will continue denosumab monthly.  Continue calcium twice daily.  She will receive denosumab today.   Orders placed this encounter:  No orders of the defined types were placed in this encounter.     , MD [facility] [place] [phone]   I, Kirstyn Evans, am acting as [place] scribe for Dr.  .  I,   MD, have reviewed the above documentation for accuracy and completeness, and I agree with the above.     

## 2020-07-18 ENCOUNTER — Other Ambulatory Visit: Payer: Self-pay | Admitting: Internal Medicine

## 2020-07-21 ENCOUNTER — Inpatient Hospital Stay (HOSPITAL_COMMUNITY): Payer: Medicare Other

## 2020-07-21 ENCOUNTER — Other Ambulatory Visit (HOSPITAL_COMMUNITY): Payer: Self-pay

## 2020-07-21 ENCOUNTER — Inpatient Hospital Stay (HOSPITAL_COMMUNITY): Payer: Medicare Other | Attending: Hematology | Admitting: Hematology

## 2020-07-21 ENCOUNTER — Other Ambulatory Visit: Payer: Self-pay

## 2020-07-21 VITALS — BP 118/70 | HR 91 | Temp 97.0°F | Resp 18

## 2020-07-21 DIAGNOSIS — C9000 Multiple myeloma not having achieved remission: Secondary | ICD-10-CM | POA: Diagnosis not present

## 2020-07-21 DIAGNOSIS — D649 Anemia, unspecified: Secondary | ICD-10-CM

## 2020-07-21 DIAGNOSIS — C9001 Multiple myeloma in remission: Secondary | ICD-10-CM

## 2020-07-21 LAB — CBC WITH DIFFERENTIAL/PLATELET
Abs Immature Granulocytes: 0.06 10*3/uL (ref 0.00–0.07)
Basophils Absolute: 0.1 10*3/uL (ref 0.0–0.1)
Basophils Relative: 1 %
Eosinophils Absolute: 0.2 10*3/uL (ref 0.0–0.5)
Eosinophils Relative: 3 %
HCT: 36 % (ref 36.0–46.0)
Hemoglobin: 11.4 g/dL — ABNORMAL LOW (ref 12.0–15.0)
Immature Granulocytes: 1 %
Lymphocytes Relative: 24 %
Lymphs Abs: 2.3 10*3/uL (ref 0.7–4.0)
MCH: 28.8 pg (ref 26.0–34.0)
MCHC: 31.7 g/dL (ref 30.0–36.0)
MCV: 90.9 fL (ref 80.0–100.0)
Monocytes Absolute: 0.5 10*3/uL (ref 0.1–1.0)
Monocytes Relative: 6 %
Neutro Abs: 6.3 10*3/uL (ref 1.7–7.7)
Neutrophils Relative %: 65 %
Platelets: 388 10*3/uL (ref 150–400)
RBC: 3.96 MIL/uL (ref 3.87–5.11)
RDW: 15.9 % — ABNORMAL HIGH (ref 11.5–15.5)
WBC: 9.5 10*3/uL (ref 4.0–10.5)
nRBC: 0 % (ref 0.0–0.2)

## 2020-07-21 LAB — COMPREHENSIVE METABOLIC PANEL
ALT: 11 U/L (ref 0–44)
AST: 15 U/L (ref 15–41)
Albumin: 3.9 g/dL (ref 3.5–5.0)
Alkaline Phosphatase: 77 U/L (ref 38–126)
Anion gap: 7 (ref 5–15)
BUN: 22 mg/dL (ref 8–23)
CO2: 28 mmol/L (ref 22–32)
Calcium: 9 mg/dL (ref 8.9–10.3)
Chloride: 101 mmol/L (ref 98–111)
Creatinine, Ser: 0.98 mg/dL (ref 0.44–1.00)
GFR, Estimated: 59 mL/min — ABNORMAL LOW (ref >60)
Glucose, Bld: 197 mg/dL — ABNORMAL HIGH (ref 70–99)
Potassium: 3.7 mmol/L (ref 3.5–5.1)
Sodium: 136 mmol/L (ref 135–145)
Total Bilirubin: 1 mg/dL (ref 0.3–1.2)
Total Protein: 7.4 g/dL (ref 6.5–8.1)

## 2020-07-21 LAB — LACTATE DEHYDROGENASE: LDH: 141 U/L (ref 98–192)

## 2020-07-21 MED ORDER — DENOSUMAB 120 MG/1.7ML ~~LOC~~ SOLN
120.0000 mg | Freq: Once | SUBCUTANEOUS | Status: AC
Start: 2020-07-21 — End: 2020-07-21
  Administered 2020-07-21: 120 mg via SUBCUTANEOUS
  Filled 2020-07-21: qty 1.7

## 2020-07-21 MED ORDER — LENALIDOMIDE 10 MG PO CAPS: 10.0000 mg | ORAL_CAPSULE | Freq: Every day | ORAL | 0 refills | Status: DC

## 2020-07-21 NOTE — Patient Instructions (Signed)
[facility] at [facility] Discharge Instructions  You were seen today by Dr. Katragadda. He went over your recent results. Dr. Katragadda will see you back in 1 month for labs and follow up.   Thank you for choosing [facility] at [facility] to provide your oncology and hematology care.  To afford each patient quality time with our provider, please arrive at least 15 minutes before your scheduled appointment time.   If you have a lab appointment with the Cancer Center please come in thru the Main Entrance and check in at the main information desk  You need to re-schedule your appointment should you arrive 10 or more minutes late.  We strive to give you quality time with our providers, and arriving late affects you and other patients whose appointments are after yours.  Also, if you no show three or more times for appointments you may be dismissed from the clinic at the providers discretion.     Again, thank you for choosing [facility].  Our hope is that these requests will decrease the amount of time that you wait before being seen by our physicians.       _____________________________________________________________  Should you have questions after your visit to  Cancer Center, please contact our office at [phone] between the hours of 8:00 a.m. and 4:30 p.m.  Voicemails left after 4:00 p.m. will not be returned until the following business day.  For prescription refill requests, have your pharmacy contact our office and allow 72 hours.    Cancer Center Support Programs:   > Cancer Support Group  2nd Tuesday of the month 1pm-2pm, Journey Room   

## 2020-07-21 NOTE — Progress Notes (Signed)
Patient tolerated Xgeva injection with no complaints voiced.  Site clean and dry with no bruising or swelling noted.  No complaints of pain.  Discharged with vital signs stable and no signs or symptoms of distress noted.  

## 2020-07-21 NOTE — Telephone Encounter (Signed)
New prescription for Revlimid 10mg  sent to [facility] for patient to take 14 days on, 7 days off per Dr. Delton Coombes

## 2020-07-22 ENCOUNTER — Telehealth (INDEPENDENT_AMBULATORY_CARE_PROVIDER_SITE_OTHER): Payer: Medicare Other | Admitting: Nurse Practitioner

## 2020-07-22 DIAGNOSIS — R197 Diarrhea, unspecified: Secondary | ICD-10-CM

## 2020-07-22 LAB — KAPPA/LAMBDA LIGHT CHAINS
Kappa free light chain: 31 mg/L — ABNORMAL HIGH (ref 3.3–19.4)
Kappa, lambda light chain ratio: 1.63 (ref 0.26–1.65)
Lambda free light chains: 19 mg/L (ref 5.7–26.3)

## 2020-07-22 LAB — PROTEIN ELECTROPHORESIS, SERUM
A/G Ratio: 1.2 (ref 0.7–1.7)
Albumin ELP: 3.7 g/dL (ref 2.9–4.4)
Alpha-1-Globulin: 0.2 g/dL (ref 0.0–0.4)
Alpha-2-Globulin: 0.8 g/dL (ref 0.4–1.0)
Beta Globulin: 1.2 g/dL (ref 0.7–1.3)
Gamma Globulin: 0.7 g/dL (ref 0.4–1.8)
Globulin, Total: 3 g/dL (ref 2.2–3.9)
Total Protein ELP: 6.7 g/dL (ref 6.0–8.5)

## 2020-07-22 NOTE — Progress Notes (Signed)
NO CHARGE!!! Pt changed her mind and didn't want a visit.

## 2020-07-23 LAB — IMMUNOFIXATION ELECTROPHORESIS
IgA: 389 mg/dL (ref 64–422)
IgG (Immunoglobin G), Serum: 900 mg/dL (ref 586–1602)
IgM (Immunoglobulin M), Srm: 21 mg/dL — ABNORMAL LOW (ref 26–217)
Total Protein ELP: 6.7 g/dL (ref 6.0–8.5)

## 2020-07-25 ENCOUNTER — Ambulatory Visit: Payer: Medicare Other

## 2020-07-25 ENCOUNTER — Other Ambulatory Visit: Payer: Self-pay

## 2020-07-25 ENCOUNTER — Ambulatory Visit (INDEPENDENT_AMBULATORY_CARE_PROVIDER_SITE_OTHER): Payer: Medicare Other | Admitting: Orthopedic Surgery

## 2020-07-25 ENCOUNTER — Encounter: Payer: Self-pay | Admitting: Orthopedic Surgery

## 2020-07-25 DIAGNOSIS — S8261XD Displaced fracture of lateral malleolus of right fibula, subsequent encounter for closed fracture with routine healing: Secondary | ICD-10-CM

## 2020-07-25 NOTE — Progress Notes (Signed)
[facility] Return  Assessment: [place] is a 80 y.o. female with the following: Minimally displaced, right distal fibula fracture  Plan: Repeat radiographs obtained in clinic demonstrates maintenance of alignment.  She continues to have a mildly displaced distal fibula fracture.  Minimal tenderness palpation on physical exam.  She tolerates gentle range of motion.  Although I do not think she has fully healed at this time, I am comfortable transitioning her to a walking boot.  I have advised her to keep the boot on at all times.  No weightbearing until she is seen in clinic again.  If she has any issues, she is to contact clinic, otherwise I will see her in 4 weeks.  At that time, we will allow her to start advancing her weightbearing.   Follow-up: Return in about 4 weeks (around 08/22/2020).   Subjective:  Chief Complaint  Patient presents with   Ankle Injury    Right ankle fracture 07/08/20    History of Present Illness: [place] is a 80 y.o. female who returns to clinic today for repeat evaluation of her right ankle injury.  She sustained a right distal fibula fracture, approximately 2 weeks ago.  She has been in a cast, and struggling with the weight of the cast.  No issues otherwise.  Her pain is controlled.  She has not been walking on this right foot.  Review of Systems: No fevers or chills No numbness or tingling No chest pain No shortness of breath No bowel or bladder dysfunction No GI distress No headaches Objective: There were no vitals taken for this visit.  Physical Exam:   Elderly female.  Seated in wheelchair.  Alert and oriented.  No acute distress.  Evaluation right ankle demonstrates mild residual swelling.  Minimal ecchymosis is appreciated.  She tolerates dorsiflexion of the ankle.  Sensation is intact over the dorsum of the foot.  Minimal pain with lateral palpation.  No increased laxity to varus or valgus stress.  Toes warm and  well-perfused.  IMAGING: I personally ordered and reviewed the following images:  X-rays of the right ankle were obtained in clinic today and demonstrates maintenance of alignment.  There is been no interval displacement of the distal fibula fracture.  Mortise remains intact.  No evidence of syndesmotic disruption.  Impression: Right distal fibula fracture, minimally displaced, in acceptable alignment.   Mordecai Rasmussen, MD 07/25/2020 12:59 PM

## 2020-07-25 NOTE — Patient Instructions (Signed)
Keep walking boot on at all times.  Ok to remove for hygiene only  Remain non-weightbearing on your right ankle.  No walking until I see you in clinic again.  Please call if you have any issues.

## 2020-08-07 ENCOUNTER — Other Ambulatory Visit (HOSPITAL_COMMUNITY): Payer: Self-pay | Admitting: Hematology

## 2020-08-07 ENCOUNTER — Other Ambulatory Visit: Payer: Self-pay | Admitting: Internal Medicine

## 2020-08-08 ENCOUNTER — Other Ambulatory Visit (HOSPITAL_COMMUNITY): Payer: Self-pay

## 2020-08-08 MED ORDER — LENALIDOMIDE 10 MG PO CAPS: 10.0000 mg | ORAL_CAPSULE | Freq: Every day | ORAL | 0 refills | Status: DC

## 2020-08-08 NOTE — Telephone Encounter (Signed)
Chart reviewed. Revlimid refilled per Dr. Delton Coombes last office note.

## 2020-08-15 ENCOUNTER — Other Ambulatory Visit: Payer: Self-pay | Admitting: Internal Medicine

## 2020-08-17 NOTE — Progress Notes (Signed)
[place], [place] [address]   CLINIC:  Medical Oncology/Hematology  PCP:  Lindell Spar, MD [address] / [place] [address] [phone]   REASON FOR VISIT:  Follow-up for multiple myeloma & normocytic anemia  PRIOR THERAPY: Velcade x 9 cycles from 10/29/2019 to 05/02/2020  NGS Results: not done  CURRENT THERAPY: Revlimid 2/3 weeks  BRIEF ONCOLOGIC HISTORY:  Oncology History  Multiple myeloma not having achieved remission ([facility])  10/13/2019 Initial Diagnosis   Multiple myeloma not having achieved remission ([facility])    10/29/2019 -  Chemotherapy    Patient is on Treatment Plan: MYELOMA NON-TRANSPLANT CANDIDATES VRD WEEKLY Q21D         CANCER STAGING: Cancer Staging No matching staging information was found for the patient.  Virtual Visit via Video Note  I connected with Kim Mills on _0 @ at  2:00 PM EDT by a video enabled telemedicine application and verified that I am speaking with the correct person using two identifiers.  Location: Patient: Kim Mills Provider: Dr. Derek Jack   I discussed the limitations of evaluation and management by telemedicine and the availability of in person appointments. The patient expressed understanding and agreed to proceed.  INTERVAL HISTORY:  [name], a 80 y.o. female, returns for routine follow-up of her multiple myeloma & normocytic anemia. Kim Mills was last seen on 07/21/20.   Today she reports feeling well. She reports feeling increasingly weak for the past three days accompanied by drowsiness and diarrhea. She reports constipation which she attributes to tramadol. She is taking Revlimid and tolerating it well. She complains of knee pain bilaterally as well as pain in her left hip which is stable at baseline. She denies any leg swellings, but reports tingling and numbness in her feet which is stable. She denies any recent falls or jaw pain, but  reports episodes of nausea in the evening around 1-2 times monthly. Her appetite is poor and she drinks one Boost daily.  REVIEW OF SYSTEMS:  Review of Systems  Constitutional:  Positive for appetite change (20%) and fatigue (depleted).  Respiratory:  Positive for cough and shortness of breath.   Cardiovascular:  Negative for leg swelling.  Gastrointestinal:  Positive for constipation, diarrhea, nausea and vomiting.  Musculoskeletal:  Positive for arthralgias (knee and hip 8/10).  Neurological:  Positive for dizziness, headaches (d/t cold) and numbness (numbness and tingling in feet).   PAST MEDICAL/SURGICAL HISTORY:  Past Medical History:  Diagnosis Date   Arthritis    Cancer [facility])    Cataract    Chest pain    Associated with weakness and fatigue   Diabetes mellitus    A1c of 7.4 in 08/2010   GERD (gastroesophageal reflux disease)    Glaucoma    Hyperlipidemia    Lipid profile in 08/2010:190, 121, 48, 118; normal CBC and CMet   Hypertension    Lipid profile in 08/2010:190, 121, 48, 118; normal CBC and CMet   Hypothyroidism    Hypothyroidism    Obesity    Past Surgical History:  Procedure Laterality Date   CHOLECYSTECTOMY     COLONOSCOPY N/A 04/01/2014   RMR: Melanosis coli. colonic polyps removed as described above.    CYSTOSCOPY W/ URETERAL STENT PLACEMENT Right 09/13/2019   Procedure: CYSTOSCOPY WITH RIGHT  RETROGRADE PYELOGRAM; BLADDER BIOPSY;  Surgeon: Cleon Gustin, MD;  Location: [facility];  Service: Urology;  Laterality: Right;   LEFT HEART CATH AND  CORONARY ANGIOGRAPHY N/A 10/07/2017   Procedure: LEFT HEART CATH AND CORONARY ANGIOGRAPHY;  Surgeon: Troy Sine, MD;  Location: [facility];  Service: Cardiovascular;  Laterality: N/A;   THYROIDECTOMY, PARTIAL      SOCIAL HISTORY:  Social History   Socioeconomic History   Marital status: Widowed    Spouse name: Not on file   Number of children: 7   Years of education: Not on file   Highest education  level: Not on file  Occupational History    Employer: RETIRED  Tobacco Use   Smoking status: Passive Smoke Exposure - Never Smoker   Smokeless tobacco: Never  Vaping Use   Vaping Use: Never used  Substance and Sexual Activity   Alcohol use: Never   Drug use: Never   Sexual activity: Not Currently  Other Topics Concern   Not on file  Social History Narrative   Not on file   Social Determinants of Health   Financial Resource Strain: Not on file  Food Insecurity: Not on file  Transportation Needs: No Transportation Needs   Lack of Transportation (Medical): No   Lack of Transportation (Non-Medical): No  Physical Activity: Inactive   Days of Exercise per Week: 0 days   Minutes of Exercise per Session: 0 min  Stress: Not on file  Social Connections: Not on file  Intimate Partner Violence: Not At Risk   Fear of Current or Ex-Partner: No   Emotionally Abused: No   Physically Abused: No   Sexually Abused: No    FAMILY HISTORY:  Family History  Problem Relation Age of Onset   Anemia Father    Arthritis Mother    Stroke Brother    Cancer Sister        unknown kind   Diabetes Sister    Dementia Sister    Cancer Niece        Breast   Cancer Brother    Healthy Son    Healthy Son    Healthy Son    Healthy Daughter    Healthy Daughter    Healthy Daughter    Sarcoidosis Daughter     CURRENT MEDICATIONS:  Current Outpatient Medications  Medication Sig Dispense Refill   acyclovir (ZOVIRAX) 400 MG tablet Take 1 tablet (400 mg total) by mouth 2 (two) times daily. 60 tablet 6   amLODipine (NORVASC) 10 MG tablet Take 1 tablet (10 mg total) by mouth daily. 90 tablet 3   ASPIRIN 81 PO Take 1 tablet by mouth daily.     Blood Glucose Monitoring Suppl (BLOOD GLUCOSE SYSTEM PAK) KIT Please dispense based on patient and insurance preference. Use as directed to monitor FSBS 2x daily. Dx: E11.9. 1 each 1   Calcium 500 MG tablet Take 600 mg by mouth 2 (two) times daily.      fluticasone (FLONASE) 50 MCG/ACT nasal spray Place 2 sprays into both nostrils daily. 16 g 6   gabapentin (NEURONTIN) 300 MG capsule TAKE ONE CAPSULE BY MOUTH EVERY NIGHT AT BEDTIME 90 capsule 3   glipiZIDE (GLUCOTROL) 5 MG tablet TAKE ONE TABLET (5MG TOTAL) BY MOUTH TWOTIMES DAILY 180 tablet 3   Glucose Blood (BLOOD GLUCOSE TEST STRIPS) STRP Please dispense based on patient and insurance preference. Use as directed to monitor FSBS 2x daily. Dx: E11.9. 100 each 11   HM LORATADINE 10 MG tablet TAKE ONE TABLET (10MG TOTAL) BY MOUTH DAILY 30 tablet 0   Lancets MISC Please dispense based on patient and insurance preference.  Use as directed to monitor FSBS 2x daily. Dx: E11.9. 100 each 11   lenalidomide (REVLIMID) 10 MG capsule Take 1 capsule (10 mg total) by mouth daily. 14 days on, 7 days off 14 capsule 0   lidocaine (XYLOCAINE) 2 % solution      losartan-hydrochlorothiazide (HYZAAR) 50-12.5 MG tablet Take 1 tablet by mouth daily. 90 tablet 0   magic mouthwash w/lidocaine SOLN Take 5 mLs by mouth 4 (four) times daily. 450 mL 0   magnesium oxide (MAG-OX) 400 (240 Mg) MG tablet TAKE ONE TABLET (400MG TOTAL) BY MOUTH TWO TIMES DAILY 120 tablet 1   meclizine (ANTIVERT) 25 MG tablet Take 25 mg by mouth every 6 (six) hours as needed for dizziness.      metFORMIN (GLUCOPHAGE) 1000 MG tablet TAKE ONE TABLET BY MOUTH TWICE A DAY 60 tablet 3   metoprolol tartrate (LOPRESSOR) 25 MG tablet TAKE ONE-HALF TABLET (12.5MG TOTAL) BY MOUTH TWO TIMES DAILY 90 tablet 3   omeprazole (PRILOSEC) 40 MG capsule TAKE ONE CAPSULE BY MOUTH DAILY 90 capsule 3   potassium chloride SA (KLOR-CON) 20 MEQ tablet Take 1 tablet (20 mEq total) by mouth 2 (two) times daily. 10 tablet 0   prochlorperazine (COMPAZINE) 10 MG tablet Take 1 tablet (10 mg total) by mouth every 6 (six) hours as needed (Nausea or vomiting). 30 tablet 1   simvastatin (ZOCOR) 40 MG tablet TAKE ONE TABLET (40MG TOTAL) BY MOUTH BEDTIME 90 tablet 3   SYNTHROID 137 MCG  tablet TAKE ONE TABLET (137MCG TOTAL) BY MOUTH DAILY BEFORE BREAKFAST 30 tablet 0   tolterodine (DETROL LA) 4 MG 24 hr capsule TAKE ONE CAPSULE BY MOUTH DAILY (Patient taking differently: Take 4 mg by mouth daily.) 90 capsule 3   traMADol (ULTRAM) 50 MG tablet TAKE ONE TABLET (50MG TOTAL) BY MOUTH TWO TIMES DAILY AS NEEDED 60 tablet 0   traMADol (ULTRAM) 50 MG tablet Take 1 tablet (50 mg total) by mouth every 8 (eight) hours as needed for severe pain. 20 tablet 0   No current facility-administered medications for this visit.    ALLERGIES:  No Known Allergies  Performance status (ECOG): 3 - Symptomatic, >50% confined to bed  There were no vitals filed for this visit. Wt Readings from Last 3 Encounters:  07/11/20 187 lb (84.8 kg)  07/08/20 187 lb (84.8 kg)  06/24/20 177 lb 1.9 oz (80.3 kg)     LABORATORY DATA:  I have reviewed the labs as listed.  CBC Latest Ref Rng & Units 07/21/2020 07/08/2020 06/23/2020  WBC 4.0 - 10.5 K/uL 9.5 7.2 6.3  Hemoglobin 12.0 - 15.0 g/dL 11.4(L) 10.6(L) 11.4(L)  Hematocrit 36.0 - 46.0 % 36.0 33.7(L) 36.1  Platelets 150 - 400 K/uL 388 162 273   CMP Latest Ref Rng & Units 07/21/2020 07/08/2020 06/23/2020  Glucose 70 - 99 mg/dL 197(H) 166(H) 103(H)  BUN 8 - 23 mg/dL _0 Creatinine 0.44 - 1.00 mg/dL 0.98 0.94 0.70  Sodium 135 - 145 mmol/L 136 134(L) 143  Potassium 3.5 - 5.1 mmol/L 3.7 4.2 3.4(L)  Chloride 98 - 111 mmol/L 101 102 107  CO2 22 - 32 mmol/L _1 Calcium 8.9 - 10.3 mg/dL 9.0 7.5(L) 7.0(L)  Total Protein 6.5 - 8.1 g/dL 7.4 - 6.9  Total Bilirubin 0.3 - 1.2 mg/dL 1.0 - 1.2  Alkaline Phos 38 - 126 U/L 77 - 61  AST 15 - 41 U/L 15 - 19  ALT 0 - 44 U/L  11 - 18    DIAGNOSTIC IMAGING:  I have independently reviewed the scans and discussed with the patient. DG Ankle Complete Right  Result Date: 07/29/2020 Formatting of this result is different from the original. X-rays of the right ankle were obtained in clinic today and demonstrates  maintenance of alignment.  There is been no interval displacement of the distal fibula fracture.  Mortise remains intact.  No evidence of syndesmotic disruption.   Impression: Right distal fibula fracture, minimally displaced, in acceptable alignment.    ASSESSMENT:  1.  Normocytic anemia: -Patient seen at the request of Dr. Buelah Manis for further work-up and management of normocytic anemia. -Recent CBC on 08/21/2019 shows hemoglobin 6.9 with MCV of 88.3.  White count and platelets are normal.  No history of CKD.  Denies any bleeding per rectum or melena. Last colonoscopy on 04/01/2014 shows diffusely pigmented rectal mucosa consistent with melanosis [name]  Diffusely pigmented colonic mucosa, 2 diminutive polyps in the mid ascending segment, otherwise the remainder of the colon mucosa was normal.  Biopsy consistent with tubular adenoma. -CT renal study on 05/11/2019 shows normal-sized spleen with normal liver.  No other abnormal adenopathy. -Denies any prior history of blood transfusion.  She is currently taking iron tablet twice daily.   2.  IgG lambda plasma cell myeloma: -Skeletal survey on 09/12/2019 shows diffuse faint lucencies in the skull, bilateral femurs. -Bone marrow biopsy on 09/27/2019 shows 70% plasma cells in hypercellular marrow.  Chromosome analysis was 23, XX. FISH panel was normal.  However quality of specimen is compromised. -LDH normal, beta-2 microglobulin 2.9.  M spike was 2.1 g.  Kappa light chains 19.7, lambda light chains 14.3 with ratio of 1.38. -24-hour urine shows nonnephrotic range proteinuria.  Urine immunofixation was positive.   PLAN:  1.  IgG lambda plasma cell myeloma: -We reviewed labs from 07/21/2020. - SPEP was negative.  Free light chain ratio was 1.63.  Kappa light chains improved to 31 from 38.  However serum immunofixation shows IgG lambda monoclonal protein. - Reviewed her labs from today which showed normal creatinine.  Calcium was also  normal.  CBC was grossly normal. - She has intermittent bouts of nausea.  She is currently taking Revlimid at 6 PM.  Nausea episodes occur around 9 to 10:00 at night.  I have recommended her to switch Revlimid to bedtime.  She will take Compazine as needed. - Mild tingling and numbness in the feet from her diabetes has been stable.  No worsening since we started treatment for myeloma. - She had a right distal fibula fracture, minimally displaced with acceptable alignment.  She has follow-up with orthopedics on Friday. - Continue Revlimid 10 mg 2 weeks on/1 week off. - RTC 4 weeks for follow-up with repeat myeloma labs.   2.  Diabetes: - Continue glipizide and metformin.   3.  Hypertension: - Continue amlodipine and HCTZ.   4.  Hypothyroidism: - Continue Synthroid daily.   5.  Diarrhea: - This has improved since dose of Revlimid was reduced to 10 mg from 15 mg. - She is having some constipation from pain medication.   6.  Left knee pain: - Continue tramadol 50 mg as needed for bilateral knee pain.   7.  Infection prophylaxis: - Continue acyclovir 400 mg twice daily and aspirin 81 mg daily.   8.  Bone protection: - She does not report any jaw pain. - Continue calcium twice daily.  Calcium today is 8.3.  To continue  denosumab monthly.   Orders placed this encounter:  No orders of the defined types were placed in this encounter.  I provided 30 minutes of non-face-to-face time during this encounter.  Derek Jack, MD [place] [phone]   I, Thana Ates, am acting as a scribe for Dr. Derek Jack.  I, Derek Jack MD, have reviewed the above documentation for accuracy and completeness, and I agree with the above.

## 2020-08-18 ENCOUNTER — Other Ambulatory Visit: Payer: Self-pay

## 2020-08-18 ENCOUNTER — Inpatient Hospital Stay (HOSPITAL_COMMUNITY): Payer: Medicare Other | Attending: Hematology

## 2020-08-18 ENCOUNTER — Inpatient Hospital Stay (HOSPITAL_BASED_OUTPATIENT_CLINIC_OR_DEPARTMENT_OTHER): Payer: Medicare Other | Admitting: Hematology

## 2020-08-18 ENCOUNTER — Encounter (HOSPITAL_COMMUNITY): Payer: Self-pay

## 2020-08-18 ENCOUNTER — Inpatient Hospital Stay (HOSPITAL_COMMUNITY): Payer: Medicare Other

## 2020-08-18 DIAGNOSIS — M25562 Pain in left knee: Secondary | ICD-10-CM | POA: Diagnosis not present

## 2020-08-18 DIAGNOSIS — R197 Diarrhea, unspecified: Secondary | ICD-10-CM

## 2020-08-18 DIAGNOSIS — Z79891 Long term (current) use of opiate analgesic: Secondary | ICD-10-CM | POA: Diagnosis not present

## 2020-08-18 DIAGNOSIS — Z7984 Long term (current) use of oral hypoglycemic drugs: Secondary | ICD-10-CM | POA: Diagnosis not present

## 2020-08-18 DIAGNOSIS — I10 Essential (primary) hypertension: Secondary | ICD-10-CM | POA: Diagnosis not present

## 2020-08-18 DIAGNOSIS — C9001 Multiple myeloma in remission: Secondary | ICD-10-CM

## 2020-08-18 DIAGNOSIS — C9000 Multiple myeloma not having achieved remission: Secondary | ICD-10-CM

## 2020-08-18 DIAGNOSIS — Z79899 Other long term (current) drug therapy: Secondary | ICD-10-CM | POA: Diagnosis not present

## 2020-08-18 DIAGNOSIS — E119 Type 2 diabetes mellitus without complications: Secondary | ICD-10-CM

## 2020-08-18 DIAGNOSIS — E039 Hypothyroidism, unspecified: Secondary | ICD-10-CM

## 2020-08-18 DIAGNOSIS — D649 Anemia, unspecified: Secondary | ICD-10-CM

## 2020-08-18 LAB — COMPREHENSIVE METABOLIC PANEL
ALT: 14 U/L (ref 0–44)
AST: 18 U/L (ref 15–41)
Albumin: 3.9 g/dL (ref 3.5–5.0)
Alkaline Phosphatase: 71 U/L (ref 38–126)
Anion gap: 9 (ref 5–15)
BUN: 12 mg/dL (ref 8–23)
CO2: 23 mmol/L (ref 22–32)
Calcium: 8.3 mg/dL — ABNORMAL LOW (ref 8.9–10.3)
Chloride: 107 mmol/L (ref 98–111)
Creatinine, Ser: 0.77 mg/dL (ref 0.44–1.00)
GFR, Estimated: >60 mL/min (ref >60)
Glucose, Bld: 161 mg/dL — ABNORMAL HIGH (ref 70–99)
Potassium: 3.3 mmol/L — ABNORMAL LOW (ref 3.5–5.1)
Sodium: 139 mmol/L (ref 135–145)
Total Bilirubin: 1.1 mg/dL (ref 0.3–1.2)
Total Protein: 7 g/dL (ref 6.5–8.1)

## 2020-08-18 LAB — CBC WITH DIFFERENTIAL/PLATELET
Abs Immature Granulocytes: 0.07 10*3/uL (ref 0.00–0.07)
Basophils Absolute: 0.1 10*3/uL (ref 0.0–0.1)
Basophils Relative: 1 %
Eosinophils Absolute: 0.4 10*3/uL (ref 0.0–0.5)
Eosinophils Relative: 6 %
HCT: 39.4 % (ref 36.0–46.0)
Hemoglobin: 12.5 g/dL (ref 12.0–15.0)
Immature Granulocytes: 1 %
Lymphocytes Relative: 42 %
Lymphs Abs: 2.7 10*3/uL (ref 0.7–4.0)
MCH: 28.7 pg (ref 26.0–34.0)
MCHC: 31.7 g/dL (ref 30.0–36.0)
MCV: 90.6 fL (ref 80.0–100.0)
Monocytes Absolute: 0.7 10*3/uL (ref 0.1–1.0)
Monocytes Relative: 10 %
Neutro Abs: 2.6 10*3/uL (ref 1.7–7.7)
Neutrophils Relative %: 40 %
Platelets: 340 10*3/uL (ref 150–400)
RBC: 4.35 MIL/uL (ref 3.87–5.11)
RDW: 17.4 % — ABNORMAL HIGH (ref 11.5–15.5)
WBC: 6.6 10*3/uL (ref 4.0–10.5)
nRBC: 0 % (ref 0.0–0.2)

## 2020-08-18 LAB — LACTATE DEHYDROGENASE: LDH: 178 U/L (ref 98–192)

## 2020-08-18 MED ORDER — DENOSUMAB 120 MG/1.7ML ~~LOC~~ SOLN
120.0000 mg | Freq: Once | SUBCUTANEOUS | Status: AC
Start: 2020-08-18 — End: 2020-08-18
  Administered 2020-08-18: 120 mg via SUBCUTANEOUS

## 2020-08-18 NOTE — Patient Instructions (Addendum)
[facility] at [facility] Discharge Instructions  You were seen today by Dr. Katragadda. He went over your recent results, and you received your treatment. Dr. Katragadda will see you back in 1 month for labs and follow up.   Thank you for choosing  Cancer Center at [facility] to provide your oncology and hematology care.  To afford each patient quality time with our provider, please arrive at least 15 minutes before your scheduled appointment time.   If you have a lab appointment with the Cancer Center please come in thru the Main Entrance and check in at the main information desk  You need to re-schedule your appointment should you arrive 10 or more minutes late.  We strive to give you quality time with our providers, and arriving late affects you and other patients whose appointments are after yours.  Also, if you no show three or more times for appointments you may be dismissed from the clinic at the providers discretion.     Again, thank you for choosing [facility].  Our hope is that these requests will decrease the amount of time that you wait before being seen by our physicians.       _____________________________________________________________  Should you have questions after your visit to [facility], please contact our office at [phone] between the hours of 8:00 a.m. and 4:30 p.m.  Voicemails left after 4:00 p.m. will not be returned until the following business day.  For prescription refill requests, have your pharmacy contact our office and allow 72 hours.    Cancer Center Support Programs:   > Cancer Support Group  2nd Tuesday of the month 1pm-2pm, Journey Room   

## 2020-08-18 NOTE — Patient Instructions (Signed)
[facility]  Discharge Instructions: Thank you for choosing [facility] to provide your oncology and hematology care.  If you have a lab appointment with the [facility], please come in thru the Main Entrance and check in at the main information desk.  Wear comfortable clothing and clothing appropriate for easy access to any Portacath or PICC line.   We strive to give you quality time with your provider. You may need to reschedule your appointment if you arrive late (15 or more minutes).  Arriving late affects you and other patients whose appointments are after yours.  Also, if you miss three or more appointments without notifying the office, you may be dismissed from the clinic at the provider's discretion.      For prescription refill requests, have your pharmacy contact our office and allow 72 hours for refills to be completed.    Today you received the following chemotherapy and/or immunotherapy agents Xgeva injection      To help prevent nausea and vomiting after your treatment, we encourage you to take your nausea medication as directed.  BELOW ARE SYMPTOMS THAT SHOULD BE REPORTED IMMEDIATELY: *FEVER GREATER THAN 100.4 F (38 C) OR HIGHER *CHILLS OR SWEATING *NAUSEA AND VOMITING THAT IS NOT CONTROLLED WITH YOUR NAUSEA MEDICATION *UNUSUAL SHORTNESS OF BREATH *UNUSUAL BRUISING OR BLEEDING *URINARY PROBLEMS (pain or burning when urinating, or frequent urination) *BOWEL PROBLEMS (unusual diarrhea, constipation, pain near the anus) TENDERNESS IN MOUTH AND THROAT WITH OR WITHOUT PRESENCE OF ULCERS (sore throat, sores in mouth, or a toothache) UNUSUAL RASH, SWELLING OR PAIN  UNUSUAL VAGINAL DISCHARGE OR ITCHING   Items with * indicate a potential emergency and should be followed up as soon as possible or go to the Emergency Department if any problems should occur.  Please show the CHEMOTHERAPY ALERT CARD or IMMUNOTHERAPY ALERT CARD at check-in to the Emergency  Department and triage nurse.  Should you have questions after your visit or need to cancel or reschedule your appointment, please contact [facility] [phone]  and follow the prompts.  Office hours are 8:00 a.m. to 4:30 p.m. Monday - Friday. Please note that voicemails left after 4:00 p.m. may not be returned until the following business day.  We are closed weekends and major holidays. You have access to a nurse at all times for urgent questions. Please call the main number to the clinic [phone] and follow the prompts.  For any non-urgent questions, you may also contact your provider using MyChart. We now offer e-Visits for anyone 43 and older to request care online for non-urgent symptoms. For details visit [email or web address].   Also download the MyChart app! Go to the app store, search "MyChart", open the app, select [facility], and log in with your MyChart username and password.  Due to Covid, a mask is required upon entering the hospital/clinic. If you do not have a mask, one will be given to you upon arrival. For doctor visits, patients may have 1 support person aged 46 or older with them. For treatment visits, patients cannot have anyone with them due to current Covid guidelines and our immunocompromised population.

## 2020-08-18 NOTE — Progress Notes (Signed)
[name] presents today for Xgeva injection per the provider's orders.  Stable during administration without incident; injection site WNL; see MAR for injection details.  Calcium noted to be 8.3.  Patient denies any dental work or jaw pain.  Patient states that she has been taking her Calcium and vitamin D supplements.  Patient tolerated procedure well and without incident.  No questions or complaints noted at this time.  Discharge from clinic via wheelchair in stable condition.  Alert and oriented X 3.  Follow up with [facility] as scheduled.

## 2020-08-18 NOTE — Progress Notes (Signed)
Patient is taking Revlimid as prescribed and denies any adverse side effects.   Patient has been assessed, vital signs and labs have been reviewed by Dr. Delton Coombes. ANC, Creatinine, LFTs, and Platelets are within treatment parameters per Dr. Delton Coombes. The patient is good to proceed with treatment at this time.  Primary RN and pharmacy aware.

## 2020-08-19 LAB — KAPPA/LAMBDA LIGHT CHAINS
Kappa free light chain: 30.2 mg/L — ABNORMAL HIGH (ref 3.3–19.4)
Kappa, lambda light chain ratio: 1.41 (ref 0.26–1.65)
Lambda free light chains: 21.4 mg/L (ref 5.7–26.3)

## 2020-08-20 LAB — PROTEIN ELECTROPHORESIS, SERUM
A/G Ratio: 1.2 (ref 0.7–1.7)
Albumin ELP: 3.7 g/dL (ref 2.9–4.4)
Alpha-1-Globulin: 0.3 g/dL (ref 0.0–0.4)
Alpha-2-Globulin: 0.7 g/dL (ref 0.4–1.0)
Beta Globulin: 1.2 g/dL (ref 0.7–1.3)
Gamma Globulin: 0.8 g/dL (ref 0.4–1.8)
Globulin, Total: 3 g/dL (ref 2.2–3.9)
M-Spike, %: 0.2 g/dL — ABNORMAL HIGH
Total Protein ELP: 6.7 g/dL (ref 6.0–8.5)

## 2020-08-21 LAB — IMMUNOFIXATION ELECTROPHORESIS
IgA: 418 mg/dL (ref 64–422)
IgG (Immunoglobin G), Serum: 960 mg/dL (ref 586–1602)
IgM (Immunoglobulin M), Srm: 27 mg/dL (ref 26–217)
Total Protein ELP: 6.8 g/dL (ref 6.0–8.5)

## 2020-08-22 ENCOUNTER — Ambulatory Visit: Payer: Medicare Other

## 2020-08-22 ENCOUNTER — Encounter: Payer: Self-pay | Admitting: Orthopedic Surgery

## 2020-08-22 ENCOUNTER — Ambulatory Visit (INDEPENDENT_AMBULATORY_CARE_PROVIDER_SITE_OTHER): Payer: Medicare Other | Admitting: Orthopedic Surgery

## 2020-08-22 ENCOUNTER — Other Ambulatory Visit: Payer: Self-pay

## 2020-08-22 DIAGNOSIS — S8261XD Displaced fracture of lateral malleolus of right fibula, subsequent encounter for closed fracture with routine healing: Secondary | ICD-10-CM

## 2020-08-22 NOTE — Progress Notes (Signed)
Orthopaedic Clinic Return  Assessment: Kim Mills is a 80 y.o. female with the following: Minimally displaced, right distal fibula fracture  Plan: Radiographs remain stable.  She is now 6 weeks out from her injury.  She is not having any pain in her ankle.  She can now advance her weight bearing, but recommend wearing her boot for at least the next 2 weeks.  After that she can transition to a regular shoe.  Ok to remove her boot when she is not walking around.   I have provided her with ankle exercises for her to initiate on her own.  She is not interested in PT at this point.  Follow up in 4 weeks.    Follow-up: Return in about 4 weeks (around 09/19/2020).   Subjective:  Chief Complaint  Patient presents with   Ankle Injury    Right fibula     History of Present Illness: Kim Mills is a 80 y.o. female who returns to clinic today for repeat evaluation of her right ankle injury.  She has been wearing a walking boot.  She finds the boot heavy and cumbersome.  She is not having any pain in her right ankle.  She has not been weight bearing weight.  Review of Systems: No fevers or chills No numbness or tingling No chest pain No shortness of breath No bowel or bladder dysfunction No GI distress No headaches Objective: There were no vitals taken for this visit.  Physical Exam:   Elderly female.  Seated in wheelchair.  Alert and oriented.  No acute distress.  Evaluation of the right ankle demonstrates no swelling.  She has no tenderness to palpation over the lateral ankle.  Intact dorsiflexion to the ankle and the great toe.  Toes are warm and well-perfused.  IMAGING: I personally ordered and reviewed the following images:  X-rays of the right ankle were obtained in clinic today and demonstrates no acute injuries.  Distal fibula fracture remains in good alignment.  There is been no interval displacement.  No obvious callus formation.  Ankle mortise is intact.  No  syndesmotic disruption.  Impression: Right distal fibula fracture, healing in good alignment.    Mordecai Rasmussen, MD 08/22/2020 11:48 AM

## 2020-08-22 NOTE — Patient Instructions (Addendum)
Wear boot on ankle for the next 2 weeks at least when ambulating  Exercises listed below.  Call with questions or concerns    Instructions  1.  You have sustained an ankle sprain  2.  I encourage you to stay on your feet and gradually remove your walking boot.   3.  Below are some exercises that you can complete on your own to improve your symptoms.  4.  As an alternative, you can search for ankle sprain exercises online, and can see some demonstrations on YouTube  5.  If you are having difficulty with these exercises, we can also prescribe formal physical therapy  Ankle Exercises Ask your health care provider which exercises are safe for you. Do exercises exactly as told by your health care provider and adjust them as directed. It is normal to feel mild stretching, pulling, tightness, or mild discomfort as you do these exercises. Stop right away if you feel sudden pain or your pain gets worse. Do not begin these exercises until told by your health care provider.  Stretching and range-of-motion exercises These exercises warm up your muscles and joints and improve the movement and flexibility of your ankle. These exercises may also help to relieve pain.  Dorsiflexion/plantar flexion  Sit with your R knee straight or bent. Do not rest your foot on anything. Flex your left ankle to tilt the top of your foot toward your shin. This is called dorsiflexion. Hold this position for 5 seconds. Point your toes downward to tilt the top of your foot away from your shin. This is called plantar flexion. Hold this position for 5 seconds. Repeat 10 times. Complete this exercise 2-3 times a day.  As tolerated  Ankle alphabet  Sit with your R foot supported at your lower leg. Do not rest your foot on anything. Make sure your foot has room to move freely. Think of your R foot as a paintbrush: Move your foot to trace each letter of the alphabet in the air. Keep your hip and knee still while you trace  the letters. Trace every letter from A to Z. Make the letters as large as you can without causing or increasing any discomfort.  Repeat 2-3 times. Complete this exercise 2-3 times a day.   Strengthening exercises These exercises build strength and endurance in your ankle. Endurance is the ability to use your muscles for a long time, even after they get tired. Dorsiflexors These are muscles that lift your foot up. Secure a rubber exercise band or tube to an object, such as a table leg, that will stay still when the band is pulled. Secure the other end around your R foot. Sit on the floor, facing the object with your R leg extended. The band or tube should be slightly tense when your foot is relaxed. Slowly flex your R ankle and toes to bring your foot toward your shin. Hold this position for 5 seconds. Slowly return your foot to the starting position, controlling the band as you do that. Repeat 10 times. Complete this exercise 2-3 times a day.  Plantar flexors These are muscles that push your foot down. Sit on the floor with your R leg extended. Loop a rubber exercise band or tube around the ball of your R foot. The ball of your foot is on the walking surface, right under your toes. The band or tube should be slightly tense when your foot is relaxed. Slowly point your toes downward, pushing them away from  you. Hold this position for 5 seconds. Slowly release the tension in the band or tube, controlling smoothly until your foot is back in the starting position. Repeat 10 times. Complete this exercise 2-3 times a day.  Towel curls  Sit in a chair on a non-carpeted surface, and put your feet on the floor. Place a towel in front of your feet. Keeping your heel on the floor, put your R foot on the towel. Pull the towel toward you by grabbing the towel with your toes and curling them under. Keep your heel on the floor. Let your toes relax. Grab the towel again. Keep pulling the towel until  it is completely underneath your foot. Repeat 10 times. Complete this exercise 2-3 times a day.  Standing plantar flexion This is an exercise in which you use your toes to lift your body's weight while standing. Stand with your feet shoulder-width apart. Keep your weight spread evenly over the width of your feet while you rise up on your toes. Use a wall or table to steady yourself if needed, but try not to use it for support. If this exercise is too easy, try these options: Shift your weight toward your R leg until you feel challenged. If told by your health care provider, lift your uninjured leg off the floor. Hold this position for 5 seconds. Repeat 10 times. Complete this exercise 2-3 times a day.  Tandem walking Stand with one foot directly in front of the other. Slowly raise your back foot up, lifting your heel before your toes, and place it directly in front of your other foot. Continue to walk in this heel-to-toe way. Have a countertop or wall nearby to use if needed to keep your balance, but try not to hold onto anything for support.  Repeat 10 times. Complete this exercise 2-3 times a day.   Document Revised: 10/22/2017 Document Reviewed: 10/24/2017 Elsevier Patient Education  [place].

## 2020-08-28 ENCOUNTER — Other Ambulatory Visit (HOSPITAL_COMMUNITY): Payer: Self-pay

## 2020-08-28 MED ORDER — LENALIDOMIDE 10 MG PO CAPS: 10.0000 mg | ORAL_CAPSULE | Freq: Every day | ORAL | 0 refills | Status: DC

## 2020-08-28 NOTE — Telephone Encounter (Signed)
Chart reviewed. Revlimid refilled per last office visit with Dr. Delton Coombes

## 2020-09-08 ENCOUNTER — Other Ambulatory Visit: Payer: Self-pay

## 2020-09-08 ENCOUNTER — Encounter: Payer: Medicare Other | Admitting: *Deleted

## 2020-09-08 NOTE — Progress Notes (Signed)
I connected with  [name] on 09/18/20 by an audio enabled telemedicine application and verified that I am speaking with the correct person using two identifiers.   I discussed the limitations, risks, security and privacy concerns of performing an evaluation and management service by telephone and the availability of in person appointments. I also discussed with the patient that there may be a patient responsible charge related to this service. The patient expressed understanding and verbally consented to this telephonic visit.   PATIENT DID NOT ANSWER THE CALL.

## 2020-09-11 ENCOUNTER — Other Ambulatory Visit: Payer: Self-pay | Admitting: Internal Medicine

## 2020-09-13 NOTE — Progress Notes (Signed)
[place], [place] [address]   CLINIC:  Medical Oncology/Hematology  PCP:  Lindell Spar, MD [address] / [place] [address] [phone]   REASON FOR VISIT:  Follow-up for multiple myeloma & normocytic anemia  PRIOR THERAPY:   Velcade x 9 cycles from 10/29/2019 to 05/02/2020 Revlimid 2/3 weeks  NGS Results: not done  CURRENT THERAPY: surveillance  BRIEF ONCOLOGIC HISTORY:  Oncology History  Multiple myeloma not having achieved remission ([facility])  10/13/2019 Initial Diagnosis   Multiple myeloma not having achieved remission ([facility])    10/29/2019 -  Chemotherapy    Patient is on Treatment Plan: MYELOMA NON-TRANSPLANT CANDIDATES VRD WEEKLY Q21D         CANCER STAGING: Cancer Staging No matching staging information was found for the patient.  INTERVAL HISTORY:  Ms. CHARMIN AGUINIGA, a 80 y.o. female, returns for routine follow-up of her multiple myeloma & normocytic anemia. Arliss was last seen on 08/18/20.   Today she reports feeling well, and she is accompanied by her daughter. She reports diarrhea, constipation, occasional nausea, vomiting, and bilateral knee pain. She has been taking Compazine prn. She has lost 27 lbs over the past 2 months. She reports tingling and numbness in her feet and fingers. Her appetite is good although her portion sizes have decreased. She is not currently drinking any Boost/Ensure.  REVIEW OF SYSTEMS:  Review of Systems  Constitutional:  Positive for fatigue (50%) and unexpected weight change. Negative for appetite change (75%).  Gastrointestinal:  Positive for constipation, diarrhea, nausea and vomiting.  Musculoskeletal:  Positive for arthralgias (5/10 knees).  Neurological:  Positive for numbness (feet and fingers).  All other systems reviewed and are negative.  PAST MEDICAL/SURGICAL HISTORY:  Past Medical History:  Diagnosis Date   Arthritis    Cancer [facility])    Cataract    Chest pain     Associated with weakness and fatigue   Diabetes mellitus    A1c of 7.4 in 08/2010   GERD (gastroesophageal reflux disease)    Glaucoma    Hyperlipidemia    Lipid profile in 08/2010:190, 121, 48, 118; normal CBC and CMet   Hypertension    Lipid profile in 08/2010:190, 121, 48, 118; normal CBC and CMet   Hypothyroidism    Hypothyroidism    Obesity    Past Surgical History:  Procedure Laterality Date   CHOLECYSTECTOMY     COLONOSCOPY N/A 04/01/2014   RMR: Melanosis coli. colonic polyps removed as described above.    CYSTOSCOPY W/ URETERAL STENT PLACEMENT Right 09/13/2019   Procedure: CYSTOSCOPY WITH RIGHT  RETROGRADE PYELOGRAM; BLADDER BIOPSY;  Surgeon: Cleon Gustin, MD;  Location: [facility];  Service: Urology;  Laterality: Right;   LEFT HEART CATH AND CORONARY ANGIOGRAPHY N/A 10/07/2017   Procedure: LEFT HEART CATH AND CORONARY ANGIOGRAPHY;  Surgeon: Troy Sine, MD;  Location: [facility];  Service: Cardiovascular;  Laterality: N/A;   THYROIDECTOMY, PARTIAL      SOCIAL HISTORY:  Social History   Socioeconomic History   Marital status: Widowed    Spouse name: Not on file   Number of children: 7   Years of education: Not on file   Highest education level: Not on file  Occupational History    Employer: RETIRED  Tobacco Use   Smoking status: Never    Passive exposure: Yes   Smokeless tobacco: Never  Vaping Use   Vaping Use: Never used  Substance and  Sexual Activity   Alcohol use: Never   Drug use: Never   Sexual activity: Not Currently  Other Topics Concern   Not on file  Social History Narrative   Not on file   Social Determinants of Health   Financial Resource Strain: Not on file  Food Insecurity: Not on file  Transportation Needs: No Transportation Needs   Lack of Transportation (Medical): No   Lack of Transportation (Non-Medical): No  Physical Activity: Inactive   Days of Exercise per Week: 0 days   Minutes of Exercise per Session: 0 min  Stress:  Not on file  Social Connections: Not on file  Intimate Partner Violence: Not At Risk   Fear of Current or Ex-Partner: No   Emotionally Abused: No   Physically Abused: No   Sexually Abused: No    FAMILY HISTORY:  Family History  Problem Relation Age of Onset   Anemia Father    Arthritis Mother    Stroke Brother    Cancer Sister        unknown kind   Diabetes Sister    Dementia Sister    Cancer Niece        Breast   Cancer Brother    Healthy Son    Healthy Son    Healthy Son    Healthy Daughter    Healthy Daughter    Healthy Daughter    Sarcoidosis Daughter     CURRENT MEDICATIONS:  Current Outpatient Medications  Medication Sig Dispense Refill   acyclovir (ZOVIRAX) 400 MG tablet Take 1 tablet (400 mg total) by mouth 2 (two) times daily. 60 tablet 6   amLODipine (NORVASC) 10 MG tablet Take 1 tablet (10 mg total) by mouth daily. 90 tablet 3   ASPIRIN 81 PO Take 1 tablet by mouth daily.     Blood Glucose Monitoring Suppl (BLOOD GLUCOSE SYSTEM PAK) KIT Please dispense based on patient and insurance preference. Use as directed to monitor FSBS 2x daily. Dx: E11.9. 1 each 1   Calcium 500 MG tablet Take 600 mg by mouth 2 (two) times daily.     fluticasone (FLONASE) 50 MCG/ACT nasal spray USE TWO SPRAYS IN BOTH NOSTRILS DAILY 16 g 6   gabapentin (NEURONTIN) 300 MG capsule TAKE ONE CAPSULE BY MOUTH EVERY NIGHT AT BEDTIME 90 capsule 3   glipiZIDE (GLUCOTROL) 5 MG tablet TAKE ONE TABLET (5MG TOTAL) BY MOUTH TWOTIMES DAILY 180 tablet 3   Glucose Blood (BLOOD GLUCOSE TEST STRIPS) STRP Please dispense based on patient and insurance preference. Use as directed to monitor FSBS 2x daily. Dx: E11.9. 100 each 11   HM LORATADINE 10 MG tablet TAKE ONE TABLET (10MG TOTAL) BY MOUTH DAILY 30 tablet 0   Lancets MISC Please dispense based on patient and insurance preference. Use as directed to monitor FSBS 2x daily. Dx: E11.9. 100 each 11   lenalidomide (REVLIMID) 10 MG capsule Take 1 capsule (10 mg  total) by mouth daily. 14 days on, 7 days off 14 capsule 0   lidocaine (XYLOCAINE) 2 % solution      losartan-hydrochlorothiazide (HYZAAR) 50-12.5 MG tablet Take 1 tablet by mouth daily. 90 tablet 0   magic mouthwash w/lidocaine SOLN Take 5 mLs by mouth 4 (four) times daily. 450 mL 0   magnesium oxide (MAG-OX) 400 (240 Mg) MG tablet TAKE ONE TABLET (400MG TOTAL) BY MOUTH TWO TIMES DAILY 120 tablet 1   meclizine (ANTIVERT) 25 MG tablet Take 25 mg by mouth every 6 (six) hours as  needed for dizziness.      metFORMIN (GLUCOPHAGE) 1000 MG tablet TAKE ONE TABLET BY MOUTH TWICE A DAY 60 tablet 3   metoprolol tartrate (LOPRESSOR) 25 MG tablet TAKE ONE-HALF TABLET (12.5MG TOTAL) BY MOUTH TWO TIMES DAILY 90 tablet 3   omeprazole (PRILOSEC) 40 MG capsule TAKE ONE CAPSULE BY MOUTH DAILY 90 capsule 3   potassium chloride SA (KLOR-CON) 20 MEQ tablet Take 1 tablet (20 mEq total) by mouth 2 (two) times daily. 10 tablet 0   simvastatin (ZOCOR) 40 MG tablet TAKE ONE TABLET (40MG TOTAL) BY MOUTH BEDTIME 90 tablet 3   SYNTHROID 137 MCG tablet TAKE ONE TABLET (137MCG TOTAL) BY MOUTH DAILY BEFORE BREAKFAST 30 tablet 0   tolterodine (DETROL LA) 4 MG 24 hr capsule TAKE ONE CAPSULE BY MOUTH DAILY (Patient taking differently: Take 4 mg by mouth daily.) 90 capsule 3   traMADol (ULTRAM) 50 MG tablet TAKE ONE TABLET (50MG TOTAL) BY MOUTH TWO TIMES DAILY AS NEEDED 60 tablet 0   traMADol (ULTRAM) 50 MG tablet Take 1 tablet (50 mg total) by mouth every 8 (eight) hours as needed for severe pain. 20 tablet 0   prochlorperazine (COMPAZINE) 10 MG tablet Take 1 tablet (10 mg total) by mouth every 6 (six) hours as needed (Nausea or vomiting). (Patient not taking: No sig reported) 30 tablet 1   No current facility-administered medications for this visit.    ALLERGIES:  No Known Allergies  PHYSICAL EXAM:  Performance status (ECOG): 3 - Symptomatic, >50% confined to bed  Vitals:   09/15/20 1314  BP: 137/69  Pulse: 88  Resp: 18   Temp: (!) 96.7 F (35.9 C)  SpO2: 99%   Wt Readings from Last 3 Encounters:  09/15/20 160 lb 3.2 oz (72.7 kg)  07/11/20 187 lb (84.8 kg)  07/08/20 187 lb (84.8 kg)   Physical Exam Vitals reviewed.  Constitutional:      Appearance: Normal appearance.  Cardiovascular:     Rate and Rhythm: Normal rate and regular rhythm.     Pulses: Normal pulses.     Heart sounds: Normal heart sounds.  Pulmonary:     Effort: Pulmonary effort is normal.     Breath sounds: Normal breath sounds.  Neurological:     General: No focal deficit present.     Mental Status: She is alert and oriented to person, place, and time.  Psychiatric:        Mood and Affect: Mood normal.        Behavior: Behavior normal.     LABORATORY DATA:  I have reviewed the labs as listed.  CBC Latest Ref Rng & Units 09/15/2020 08/18/2020 07/21/2020  WBC 4.0 - 10.5 K/uL 7.4 6.6 9.5  Hemoglobin 12.0 - 15.0 g/dL 11.9(L) 12.5 11.4(L)  Hematocrit 36.0 - 46.0 % 38.4 39.4 36.0  Platelets 150 - 400 K/uL 345 340 388   CMP Latest Ref Rng & Units 09/15/2020 08/18/2020 07/21/2020  Glucose 70 - 99 mg/dL 179(H) 161(H) 197(H)  BUN 8 - 23 mg/dL _0 Creatinine 0.44 - 1.00 mg/dL 0.85 0.77 0.98  Sodium 135 - 145 mmol/L 140 139 136  Potassium 3.5 - 5.1 mmol/L 2.7(LL) 3.3(L) 3.7  Chloride 98 - 111 mmol/L 102 107 101  CO2 22 - 32 mmol/L _1 Calcium 8.9 - 10.3 mg/dL 7.8(L) 8.3(L) 9.0  Total Protein 6.5 - 8.1 g/dL 7.1 7.0 7.4  Total Bilirubin 0.3 - 1.2 mg/dL 1.1 1.1 1.0  Alkaline Phos  38 - 126 U/L 58 71 77  AST 15 - 41 U/L 14(L) 18 15  ALT 0 - 44 U/L _0 DIAGNOSTIC IMAGING:  I have independently reviewed the scans and discussed with the patient. DG Ankle Complete Right  Result Date: 08/22/2020 Formatting of this result is different from the original. X-rays of the right ankle were obtained in clinic today and demonstrates no acute injuries.  Distal fibula fracture remains in good alignment.  There is been no interval  displacement.  No obvious callus formation.  Ankle mortise is intact.  No syndesmotic disruption.   Impression: Right distal fibula fracture, healing in good alignment.      ASSESSMENT:  1.  Normocytic anemia: -Patient seen at the request of Dr. Buelah Manis for further work-up and management of normocytic anemia. -Recent CBC on 08/21/2019 shows hemoglobin 6.9 with MCV of 88.3.  White count and platelets are normal.  No history of CKD.  Denies any bleeding per rectum or melena. Last colonoscopy on 04/01/2014 shows diffusely pigmented rectal mucosa consistent with melanosis [name]  Diffusely pigmented colonic mucosa, 2 diminutive polyps in the mid ascending segment, otherwise the remainder of the colon mucosa was normal.  Biopsy consistent with tubular adenoma. -CT renal study on 05/11/2019 shows normal-sized spleen with normal liver.  No other abnormal adenopathy. -Denies any prior history of blood transfusion.  She is currently taking iron tablet twice daily.   2.  IgG lambda plasma cell myeloma: -Skeletal survey on 09/12/2019 shows diffuse faint lucencies in the skull, bilateral femurs. -Bone marrow biopsy on 09/27/2019 shows 70% plasma cells in hypercellular marrow.  Chromosome analysis was 78, XX. FISH panel was normal.  However quality of specimen is compromised. -LDH normal, beta-2 microglobulin 2.9.  M spike was 2.1 g.  Kappa light chains 19.7, lambda light chains 14.3 with ratio of 1.38. -24-hour urine shows nonnephrotic range proteinuria.  Urine immunofixation was positive.   PLAN:  1.  IgG lambda plasma cell myeloma: - She is taking Revlimid 10 mg 2 weeks on/1 week off. - We reviewed myeloma panel from 08/18/2020. - M spike has increased to 0.2 g.  However immunofixation was polyclonal.  Free light chain ratio is normal at 1.41 and kappa light chain 30.2. - She has some weight loss.  She apparently had COVID 2 weeks ago. - We will closely monitor her weight.  Today we have  reviewed her labs which showed normal LFTs and CBC. - She will continue Revlimid at the same dose.  We will closely follow-up on M spike in 1 month.   2.  Diabetes: - Continue glipizide and metformin.   3.  Hypertension: - Continue amlodipine and HCTZ.   4.  Hypothyroidism: - Continue Synthroid daily.   5.  Diarrhea: - This is slightly improved since Revlimid dose reduced to 10 mg from 15 mg. - However she continues to have on and off diarrhea which alternates with constipation.   6.  Bilateral knee pains: - Continue tramadol 50 mg as needed.   7.  Infection prophylaxis: - Continue acyclovir 400 mg twice daily and aspirin 81 mg daily.   8.  Bone protection: - Calcium today is low at 7.8 with albumin 3.8. - He is reportedly not taking calcium on a consistent basis.  We have counseled her to take calcium every day. - We will hold her denosumab today because of hypercalcemia.  9.  Hypokalemia: - Today her potassium is severely low  at 2.7. - She will receive IV and oral potassium today. - She was told to not to miss any home doses of K-Lor 20 mEq twice daily.   Orders placed this encounter:  No orders of the defined types were placed in this encounter.    Derek Jack, MD [place] [phone]   I, Thana Ates, am acting as a scribe for Dr. Derek Jack.  I, Derek Jack MD, have reviewed the above documentation for accuracy and completeness, and I agree with the above.

## 2020-09-15 ENCOUNTER — Inpatient Hospital Stay (HOSPITAL_BASED_OUTPATIENT_CLINIC_OR_DEPARTMENT_OTHER): Payer: Medicare Other | Admitting: Hematology

## 2020-09-15 ENCOUNTER — Inpatient Hospital Stay (HOSPITAL_COMMUNITY): Payer: Medicare Other

## 2020-09-15 ENCOUNTER — Other Ambulatory Visit: Payer: Self-pay

## 2020-09-15 ENCOUNTER — Encounter (HOSPITAL_COMMUNITY): Payer: Self-pay | Admitting: Hematology

## 2020-09-15 ENCOUNTER — Inpatient Hospital Stay (HOSPITAL_COMMUNITY): Payer: Medicare Other | Attending: Hematology

## 2020-09-15 VITALS — BP 137/69 | HR 88 | Temp 96.7°F | Resp 18 | Wt 160.2 lb

## 2020-09-15 DIAGNOSIS — C9000 Multiple myeloma not having achieved remission: Secondary | ICD-10-CM | POA: Diagnosis not present

## 2020-09-15 DIAGNOSIS — E876 Hypokalemia: Secondary | ICD-10-CM

## 2020-09-15 DIAGNOSIS — D649 Anemia, unspecified: Secondary | ICD-10-CM | POA: Diagnosis not present

## 2020-09-15 LAB — CBC WITH DIFFERENTIAL/PLATELET
Abs Immature Granulocytes: 0.09 10*3/uL — ABNORMAL HIGH (ref 0.00–0.07)
Basophils Absolute: 0 10*3/uL (ref 0.0–0.1)
Basophils Relative: 1 %
Eosinophils Absolute: 0.3 10*3/uL (ref 0.0–0.5)
Eosinophils Relative: 4 %
HCT: 38.4 % (ref 36.0–46.0)
Hemoglobin: 11.9 g/dL — ABNORMAL LOW (ref 12.0–15.0)
Immature Granulocytes: 1 %
Lymphocytes Relative: 33 %
Lymphs Abs: 2.5 10*3/uL (ref 0.7–4.0)
MCH: 27.9 pg (ref 26.0–34.0)
MCHC: 31 g/dL (ref 30.0–36.0)
MCV: 90.1 fL (ref 80.0–100.0)
Monocytes Absolute: 0.2 10*3/uL (ref 0.1–1.0)
Monocytes Relative: 3 %
Neutro Abs: 4.4 10*3/uL (ref 1.7–7.7)
Neutrophils Relative %: 58 %
Platelets: 345 10*3/uL (ref 150–400)
RBC: 4.26 MIL/uL (ref 3.87–5.11)
RDW: 17.7 % — ABNORMAL HIGH (ref 11.5–15.5)
WBC: 7.4 10*3/uL (ref 4.0–10.5)
nRBC: 0 % (ref 0.0–0.2)

## 2020-09-15 LAB — COMPREHENSIVE METABOLIC PANEL
ALT: 11 U/L (ref 0–44)
AST: 14 U/L — ABNORMAL LOW (ref 15–41)
Albumin: 3.8 g/dL (ref 3.5–5.0)
Alkaline Phosphatase: 58 U/L (ref 38–126)
Anion gap: 10 (ref 5–15)
BUN: 12 mg/dL (ref 8–23)
CO2: 28 mmol/L (ref 22–32)
Calcium: 7.8 mg/dL — ABNORMAL LOW (ref 8.9–10.3)
Chloride: 102 mmol/L (ref 98–111)
Creatinine, Ser: 0.85 mg/dL (ref 0.44–1.00)
GFR, Estimated: >60 mL/min (ref >60)
Glucose, Bld: 179 mg/dL — ABNORMAL HIGH (ref 70–99)
Potassium: 2.7 mmol/L — CL (ref 3.5–5.1)
Sodium: 140 mmol/L (ref 135–145)
Total Bilirubin: 1.1 mg/dL (ref 0.3–1.2)
Total Protein: 7.1 g/dL (ref 6.5–8.1)

## 2020-09-15 LAB — LACTATE DEHYDROGENASE: LDH: 174 U/L (ref 98–192)

## 2020-09-15 MED ORDER — SODIUM CHLORIDE 0.9 % IV SOLN: INTRAVENOUS | Status: DC

## 2020-09-15 MED ORDER — POTASSIUM CHLORIDE CRYS ER 10 MEQ PO TBCR
40.0000 meq | EXTENDED_RELEASE_TABLET | ORAL | Status: AC
  Administered 2020-09-15 (×2): 40 meq via ORAL
  Filled 2020-09-15 (×2): qty 4

## 2020-09-15 MED ORDER — POTASSIUM CHLORIDE IN NACL 20-0.9 MEQ/L-% IV SOLN
Freq: Once | INTRAVENOUS | Status: AC
  Filled 2020-09-15: qty 1000

## 2020-09-15 NOTE — Patient Instructions (Addendum)
[facility] at [facility] Discharge Instructions  You were seen today by Dr. Katragadda. He went over your recent results. Dr. Katragadda will see you back in 1 month for labs and follow up.   Thank you for choosing  Cancer Center at [facility] to provide your oncology and hematology care.  To afford each patient quality time with our provider, please arrive at least 15 minutes before your scheduled appointment time.   If you have a lab appointment with the Cancer Center please come in thru the Main Entrance and check in at the main information desk  You need to re-schedule your appointment should you arrive 10 or more minutes late.  We strive to give you quality time with our providers, and arriving late affects you and other patients whose appointments are after yours.  Also, if you no show three or more times for appointments you may be dismissed from the clinic at the providers discretion.     Again, thank you for choosing [facility].  Our hope is that these requests will decrease the amount of time that you wait before being seen by our physicians.       _____________________________________________________________  Should you have questions after your visit to [facility], please contact our office at [phone] between the hours of 8:00 a.m. and 4:30 p.m.  Voicemails left after 4:00 p.m. will not be returned until the following business day.  For prescription refill requests, have your pharmacy contact our office and allow 72 hours.    Cancer Center Support Programs:   > Cancer Support Group  2nd Tuesday of the month 1pm-2pm, Journey Room   

## 2020-09-15 NOTE — Progress Notes (Signed)
Patient has been assessed, vital signs and labs have been reviewed by Dr. Delton Coombes. ANC, Creatinine, LFTs, and Platelets are within treatment parameters per Dr. Delton Coombes. (No Xgeva today due to Ca+), Will receive 20 meq KCL IV x 1 and 40 meq x 2, once before and once after treatment.   Primary RN and pharmacy aware.

## 2020-09-15 NOTE — Progress Notes (Signed)
CRITICAL VALUE ALERT Critical value received:  Potassium 2.7 Date of notification:  09/15/20 Time of notification: 13:44 pm Critical value read back:  Yes.   Nurse who received alert:  [place] RN/ Critical received from [place].  MD notified time and response:  Dr. Delton Coombes notified of potassium 2.7 through secure chat. Patient has 13:00 pm appointment with Dr. Delton Coombes.

## 2020-09-15 NOTE — Progress Notes (Signed)
Patient to treatment room for Potassium by mouth and IV today.  No complaints voiced.  Patients family at side.  Patients daughter given the post Potassium by mouth for home with understanding verbalized.   Patient tolerated hydration with potassium with no complaints voiced.  Peripheral IV site clean and dry with good blood return noted before and after hydration.  No bruising or swelling noted with left arm.  Band aid applied.  VSS with discharge and left by wheelchair with no s/s of distress noted.

## 2020-09-15 NOTE — Patient Instructions (Signed)
[facility]  Discharge Instructions: Thank you for choosing [facility] to provide your oncology and hematology care.  If you have a lab appointment with the Cancer Center, please come in thru the Main Entrance and check in at the main information desk.  Wear comfortable clothing and clothing appropriate for easy access to any Portacath or PICC line.   We strive to give you quality time with your provider. You may need to reschedule your appointment if you arrive late (15 or more minutes).  Arriving late affects you and other patients whose appointments are after yours.  Also, if you miss three or more appointments without notifying the office, you may be dismissed from the clinic at the provider's discretion.      For prescription refill requests, have your pharmacy contact our office and allow 72 hours for refills to be completed.        To help prevent nausea and vomiting after your treatment, we encourage you to take your nausea medication as directed.  BELOW ARE SYMPTOMS THAT SHOULD BE REPORTED IMMEDIATELY: *FEVER GREATER THAN 100.4 F (38 C) OR HIGHER *CHILLS OR SWEATING *NAUSEA AND VOMITING THAT IS NOT CONTROLLED WITH YOUR NAUSEA MEDICATION *UNUSUAL SHORTNESS OF BREATH *UNUSUAL BRUISING OR BLEEDING *URINARY PROBLEMS (pain or burning when urinating, or frequent urination) *BOWEL PROBLEMS (unusual diarrhea, constipation, pain near the anus) TENDERNESS IN MOUTH AND THROAT WITH OR WITHOUT PRESENCE OF ULCERS (sore throat, sores in mouth, or a toothache) UNUSUAL RASH, SWELLING OR PAIN  UNUSUAL VAGINAL DISCHARGE OR ITCHING   Items with * indicate a potential emergency and should be followed up as soon as possible or go to the Emergency Department if any problems should occur.  Please show the CHEMOTHERAPY ALERT CARD or IMMUNOTHERAPY ALERT CARD at check-in to the Emergency Department and triage nurse.  Should you have questions after your visit or need to cancel  or reschedule your appointment, please contact [facility] [phone]  and follow the prompts.  Office hours are 8:00 a.m. to 4:30 p.m. Monday - Friday. Please note that voicemails left after 4:00 p.m. may not be returned until the following business day.  We are closed weekends and major holidays. You have access to a nurse at all times for urgent questions. Please call the main number to the clinic [phone] and follow the prompts.  For any non-urgent questions, you may also contact your provider using MyChart. We now offer e-Visits for anyone 18 and older to request care online for non-urgent symptoms. For details visit [email or web address].   Also download the MyChart app! Go to the app store, search "MyChart", open the app, select [facility], and log in with your MyChart username and password.  Due to Covid, a mask is required upon entering the hospital/clinic. If you do not have a mask, one will be given to you upon arrival. For doctor visits, patients may have 1 support person aged 18 or older with them. For treatment visits, patients cannot have anyone with them due to current Covid guidelines and our immunocompromised population.  

## 2020-09-16 ENCOUNTER — Other Ambulatory Visit (HOSPITAL_COMMUNITY): Payer: Self-pay

## 2020-09-16 LAB — KAPPA/LAMBDA LIGHT CHAINS
Kappa free light chain: 41.5 mg/L — ABNORMAL HIGH (ref 3.3–19.4)
Kappa, lambda light chain ratio: 1.43 (ref 0.26–1.65)
Lambda free light chains: 29.1 mg/L — ABNORMAL HIGH (ref 5.7–26.3)

## 2020-09-16 MED ORDER — LENALIDOMIDE 10 MG PO CAPS: 10.0000 mg | ORAL_CAPSULE | Freq: Every day | ORAL | 0 refills | Status: DC

## 2020-09-16 NOTE — Telephone Encounter (Signed)
Chart reviewed. Revlimid refilled per last office note with Dr. Katragadda.  

## 2020-09-18 NOTE — Progress Notes (Signed)
This encounter was created in error - please disregard.

## 2020-09-19 LAB — IMMUNOFIXATION ELECTROPHORESIS
IgA: 440 mg/dL — ABNORMAL HIGH (ref 64–422)
IgG (Immunoglobin G), Serum: 900 mg/dL (ref 586–1602)
IgM (Immunoglobulin M), Srm: 24 mg/dL — ABNORMAL LOW (ref 26–217)
Total Protein ELP: 6.2 g/dL (ref 6.0–8.5)

## 2020-09-23 ENCOUNTER — Ambulatory Visit (INDEPENDENT_AMBULATORY_CARE_PROVIDER_SITE_OTHER): Payer: Medicare Other | Admitting: Orthopedic Surgery

## 2020-09-23 ENCOUNTER — Ambulatory Visit: Payer: Medicare Other

## 2020-09-23 ENCOUNTER — Encounter: Payer: Self-pay | Admitting: Orthopedic Surgery

## 2020-09-23 ENCOUNTER — Other Ambulatory Visit: Payer: Self-pay

## 2020-09-23 VITALS — Ht 64.0 in | Wt 160.0 lb

## 2020-09-23 DIAGNOSIS — S8261XD Displaced fracture of lateral malleolus of right fibula, subsequent encounter for closed fracture with routine healing: Secondary | ICD-10-CM

## 2020-09-23 LAB — PROTEIN ELECTROPHORESIS, SERUM
A/G Ratio: 1.1 (ref 0.7–1.7)
Albumin ELP: 3.3 g/dL (ref 2.9–4.4)
Alpha-1-Globulin: 0.3 g/dL (ref 0.0–0.4)
Alpha-2-Globulin: 0.8 g/dL (ref 0.4–1.0)
Beta Globulin: 1.3 g/dL (ref 0.7–1.3)
Gamma Globulin: 0.8 g/dL (ref 0.4–1.8)
Globulin, Total: 3.1 g/dL (ref 2.2–3.9)
M-Spike, %: 0.2 g/dL — ABNORMAL HIGH
Total Protein ELP: 6.4 g/dL (ref 6.0–8.5)

## 2020-09-23 NOTE — Progress Notes (Signed)
[facility] Return  Assessment: Kim Mills is a 80 y.o. female with the following: Minimally displaced, right distal fibula fracture  Plan: Repeat radiographs today demonstrates stable appearance of a distal fibula.  No pain on physical exam.  She has transition to a regular shoe.  Injury was sustained greater than 2 months ago, and she is now able to ambulate with assistance on the right lower extremity.  I do anticipate that she will continue to improve.  Advised her to remain careful, gradually increase the level of activity.  Follow-up as needed.   Follow-up: Return if symptoms worsen or fail to improve.   Subjective:  Chief Complaint  Patient presents with   Fracture    Rt ankle fx DO 07/08/20    History of Present Illness: Kim Mills is a 80 y.o. female who returns to clinic today for repeat evaluation of her right ankle injury.  Briefly, she sustained an injury to her right ankle greater than 2 months ago.  At the last visit, she was advised to gradually transition to a regular shoe.  According to her daughter, she is not highly ambulatory, but can bear weight on her right leg.  She uses an assistive device when needed.  She is not complaining of pain.  She has been receiving medications intermittently.  Review of Systems: No fevers or chills No numbness or tingling No chest pain No shortness of breath No bowel or bladder dysfunction No GI distress No headaches Objective: Ht '5\' 4"'$  (1.626 m)   Wt 160 lb (72.6 kg)   BMI 27.46 kg/m   Physical Exam:   Elderly female.  Seated in wheelchair.  Alert and oriented.  No acute distress.  No swelling about the ankle.  No tenderness to palpation over the distal fibula.  Active motion intact in the TA and EHL.  Toes warm and well-perfused.  Sensation is intact over the dorsum of her foot.  IMAGING: I personally ordered and reviewed the following images:  X-rays of the right ankle were obtained in clinic  today and compared to previous x-rays.  There is been no interval displacement of the distal fibula fracture.  Mortise remains intact.  No syndesmotic disruption.  Interval consolidation at the fracture site.  Impression: Healing right distal fibula fracture  Mordecai Rasmussen, MD 09/23/2020 10:45 PM

## 2020-09-24 ENCOUNTER — Encounter: Payer: Self-pay | Admitting: Internal Medicine

## 2020-09-24 ENCOUNTER — Ambulatory Visit (INDEPENDENT_AMBULATORY_CARE_PROVIDER_SITE_OTHER): Payer: Medicare Other | Admitting: Internal Medicine

## 2020-09-24 ENCOUNTER — Other Ambulatory Visit: Payer: Self-pay | Admitting: Internal Medicine

## 2020-09-24 ENCOUNTER — Telehealth (HOSPITAL_COMMUNITY): Payer: Self-pay

## 2020-09-24 VITALS — BP 124/70 | HR 76 | Temp 98.0°F | Resp 18 | Ht 64.0 in

## 2020-09-24 DIAGNOSIS — Z1159 Encounter for screening for other viral diseases: Secondary | ICD-10-CM | POA: Diagnosis not present

## 2020-09-24 DIAGNOSIS — E039 Hypothyroidism, unspecified: Secondary | ICD-10-CM | POA: Diagnosis not present

## 2020-09-24 DIAGNOSIS — M542 Cervicalgia: Secondary | ICD-10-CM | POA: Diagnosis not present

## 2020-09-24 DIAGNOSIS — I10 Essential (primary) hypertension: Secondary | ICD-10-CM

## 2020-09-24 DIAGNOSIS — N3000 Acute cystitis without hematuria: Secondary | ICD-10-CM

## 2020-09-24 DIAGNOSIS — R531 Weakness: Secondary | ICD-10-CM

## 2020-09-24 DIAGNOSIS — E1143 Type 2 diabetes mellitus with diabetic autonomic (poly)neuropathy: Secondary | ICD-10-CM

## 2020-09-24 MED ORDER — HYDROCODONE-ACETAMINOPHEN 5-325 MG PO TABS: 1.0000 | ORAL_TABLET | Freq: Four times a day (QID) | ORAL | 0 refills | Status: DC | PRN

## 2020-09-24 MED ORDER — SULFAMETHOXAZOLE-TRIMETHOPRIM 800-160 MG PO TABS: 1.0000 | ORAL_TABLET | Freq: Two times a day (BID) | ORAL | 0 refills | Status: DC

## 2020-09-24 NOTE — Assessment & Plan Note (Signed)
BP Readings from Last 1 Encounters:  09/24/20 124/70   Well-controlled with Amlodipine, Metoprolol and Losartan-HCTZ Counseled for compliance with the medications Advised low salt diet

## 2020-09-24 NOTE — Assessment & Plan Note (Signed)
Could be due to active infection such as UTI Check UA and urine culture Started Bactrim for UTI Advised to maintain adequate hydration and avoid skipping any meal Check BMP and Mg

## 2020-09-24 NOTE — Progress Notes (Signed)
Established Patient Office Visit  Subjective:  Patient ID: Kim Mills, female    DOB: [birth year]  Age: 80 y.o. MRN: [id number]  CC:  Chief Complaint  Patient presents with   Follow-up    3 month follow up HTN and hypothyroidism. Pt has been feeling weak body aches and neck stiff for the last few days she has complained of itching and urine is smelling also     HPI DESIRAI TRAXLER   is a 80 year old female with PMH of multiple myeloma, HTN, CAD, DM with neuropathy, CKD stage 3, hypothyroidism, overactive bladder and chronic pain who presents for follow up of her chronic medical conditions. Daughter is present during the visit.  HTN: BP is well-controlled now. Takes medications regularly. Patient denies headache, dizziness, chest pain, dyspnea or palpitations.  DM: She takes Metformin and Glipizide. Denies any polyuria or polyphagia. Last HbA1C was 6.9.  She has been feeling weak and c/o neck pain/stiffness for last 5 days. She was not able to move her neck initially, but Daughter has been giving her Tramadol, which has improved pain. Today, she is able to move neck. Denies any claudication symptoms upon neck movement. Neck does not appear to be rigid or stiff.Daughter also reports that she has been having foul-smelling urine, but denies any hematuria. Patient was not able to provide urine sample today. But she has had recurrent UTI in the past.   Past Medical History:  Diagnosis Date   Arthritis    Cancer [facility])    Cataract    Chest pain    Associated with weakness and fatigue   Diabetes mellitus    A1c of 7.4 in 08/2010   GERD (gastroesophageal reflux disease)    Glaucoma    Hyperlipidemia    Lipid profile in 08/2010:190, 121, 48, 118; normal CBC and CMet   Hypertension    Lipid profile in 08/2010:190, 121, 48, 118; normal CBC and CMet   Hypothyroidism    Hypothyroidism    Obesity     Past Surgical History:  Procedure Laterality Date   CHOLECYSTECTOMY      COLONOSCOPY N/A 04/01/2014   RMR: Melanosis coli. colonic polyps removed as described above.    CYSTOSCOPY W/ URETERAL STENT PLACEMENT Right 09/13/2019   Procedure: CYSTOSCOPY WITH RIGHT  RETROGRADE PYELOGRAM; BLADDER BIOPSY;  Surgeon: Cleon Gustin, MD;  Location: [facility];  Service: Urology;  Laterality: Right;   LEFT HEART CATH AND CORONARY ANGIOGRAPHY N/A 10/07/2017   Procedure: LEFT HEART CATH AND CORONARY ANGIOGRAPHY;  Surgeon: Troy Sine, MD;  Location: [facility];  Service: Cardiovascular;  Laterality: N/A;   THYROIDECTOMY, PARTIAL      Family History  Problem Relation Age of Onset   Anemia Father    Arthritis Mother    Stroke Brother    Cancer Sister        unknown kind   Diabetes Sister    Dementia Sister    Cancer Niece        Breast   Cancer Brother    Healthy Son    Healthy Son    Healthy Son    Healthy Daughter    Healthy Daughter    Healthy Daughter    Sarcoidosis Daughter     Social History   Socioeconomic History   Marital status: Widowed    Spouse name: Not on file   Number of children: 7   Years of education: Not on file   Highest education level: Not  on file  Occupational History    Employer: RETIRED  Tobacco Use   Smoking status: Never    Passive exposure: Yes   Smokeless tobacco: Never  Vaping Use   Vaping Use: Never used  Substance and Sexual Activity   Alcohol use: Never   Drug use: Never   Sexual activity: Not Currently  Other Topics Concern   Not on file  Social History Narrative   Not on file   Social Determinants of Health   Financial Resource Strain: Not on file  Food Insecurity: Not on file  Transportation Needs: No Transportation Needs   Lack of Transportation (Medical): No   Lack of Transportation (Non-Medical): No  Physical Activity: Inactive   Days of Exercise per Week: 0 days   Minutes of Exercise per Session: 0 min  Stress: Not on file  Social Connections: Not on file  Intimate Partner Violence: Not At  Risk   Fear of Current or Ex-Partner: No   Emotionally Abused: No   Physically Abused: No   Sexually Abused: No    Outpatient Medications Prior to Visit  Medication Sig Dispense Refill   acyclovir (ZOVIRAX) 400 MG tablet Take 1 tablet (400 mg total) by mouth 2 (two) times daily. 60 tablet 6   amLODipine (NORVASC) 10 MG tablet Take 1 tablet (10 mg total) by mouth daily. 90 tablet 3   ASPIRIN 81 PO Take 1 tablet by mouth daily.     Blood Glucose Monitoring Suppl (BLOOD GLUCOSE SYSTEM PAK) KIT Please dispense based on patient and insurance preference. Use as directed to monitor FSBS 2x daily. Dx: E11.9. 1 each 1   Calcium 500 MG tablet Take 600 mg by mouth 2 (two) times daily.     fluticasone (FLONASE) 50 MCG/ACT nasal spray USE TWO SPRAYS IN BOTH NOSTRILS DAILY 16 g 6   gabapentin (NEURONTIN) 300 MG capsule TAKE ONE CAPSULE BY MOUTH EVERY NIGHT AT BEDTIME 90 capsule 3   glipiZIDE (GLUCOTROL) 5 MG tablet TAKE ONE TABLET (5MG TOTAL) BY MOUTH TWOTIMES DAILY 180 tablet 3   Glucose Blood (BLOOD GLUCOSE TEST STRIPS) STRP Please dispense based on patient and insurance preference. Use as directed to monitor FSBS 2x daily. Dx: E11.9. 100 each 11   HM LORATADINE 10 MG tablet TAKE ONE TABLET (10MG TOTAL) BY MOUTH DAILY 30 tablet 0   Lancets MISC Please dispense based on patient and insurance preference. Use as directed to monitor FSBS 2x daily. Dx: E11.9. 100 each 11   lenalidomide (REVLIMID) 10 MG capsule Take 1 capsule (10 mg total) by mouth daily. 14 days on, 7 days off 14 capsule 0   lidocaine (XYLOCAINE) 2 % solution      losartan-hydrochlorothiazide (HYZAAR) 50-12.5 MG tablet TAKE ONE (1) TABLET BY MOUTH EVERY DAY 90 tablet 0   magic mouthwash w/lidocaine SOLN Take 5 mLs by mouth 4 (four) times daily. 450 mL 0   magnesium oxide (MAG-OX) 400 (240 Mg) MG tablet TAKE ONE TABLET (400MG TOTAL) BY MOUTH TWO TIMES DAILY 120 tablet 1   meclizine (ANTIVERT) 25 MG tablet Take 25 mg by mouth every 6 (six)  hours as needed for dizziness.      metFORMIN (GLUCOPHAGE) 1000 MG tablet TAKE ONE TABLET BY MOUTH TWICE A DAY 60 tablet 3   metoprolol tartrate (LOPRESSOR) 25 MG tablet TAKE ONE-HALF TABLET (12.5MG TOTAL) BY MOUTH TWO TIMES DAILY 90 tablet 3   omeprazole (PRILOSEC) 40 MG capsule TAKE ONE CAPSULE BY MOUTH DAILY 90 capsule 3  potassium chloride SA (KLOR-CON) 20 MEQ tablet Take 1 tablet (20 mEq total) by mouth 2 (two) times daily. 10 tablet 0   prochlorperazine (COMPAZINE) 10 MG tablet Take 1 tablet (10 mg total) by mouth every 6 (six) hours as needed (Nausea or vomiting). 30 tablet 1   simvastatin (ZOCOR) 40 MG tablet TAKE ONE TABLET (40MG TOTAL) BY MOUTH BEDTIME 90 tablet 3   SYNTHROID 137 MCG tablet TAKE ONE TABLET (137MCG TOTAL) BY MOUTH DAILY BEFORE BREAKFAST 30 tablet 0   tolterodine (DETROL LA) 4 MG 24 hr capsule TAKE ONE CAPSULE BY MOUTH DAILY (Patient taking differently: Take 4 mg by mouth daily.) 90 capsule 3   traMADol (ULTRAM) 50 MG tablet TAKE ONE TABLET (50MG TOTAL) BY MOUTH TWO TIMES DAILY AS NEEDED 60 tablet 0   traMADol (ULTRAM) 50 MG tablet Take 1 tablet (50 mg total) by mouth every 8 (eight) hours as needed for severe pain. (Patient not taking: Reported on 09/24/2020) 20 tablet 0   No facility-administered medications prior to visit.    No Known Allergies  ROS Review of Systems  Constitutional:  Positive for fatigue. Negative for chills and fever.  HENT:  Negative for congestion, sinus pressure, sinus pain and sore throat.   Eyes:  Negative for pain and discharge.  Respiratory:  Negative for cough and shortness of breath.   Cardiovascular:  Negative for chest pain and palpitations.  Gastrointestinal:  Negative for abdominal pain, constipation, diarrhea, nausea and vomiting.  Endocrine: Negative for polydipsia and polyuria.  Genitourinary:  Negative for dysuria and hematuria.  Musculoskeletal:  Positive for arthralgias, back pain and neck pain.  Skin:  Negative for rash.   Neurological:  Positive for weakness (Generalized). Negative for dizziness.  Psychiatric/Behavioral:  Negative for agitation and behavioral problems.      Objective:    Physical Exam Vitals reviewed.  Constitutional:      General: She is not in acute distress.    Appearance: She is not diaphoretic.     Comments: In wheelchair  HENT:     Head: Normocephalic and atraumatic.     Nose: Nose normal. No congestion.     Mouth/Throat:     Mouth: Mucous membranes are moist.     Pharynx: No posterior oropharyngeal erythema.  Eyes:     General: No scleral icterus.    Extraocular Movements: Extraocular movements intact.  Cardiovascular:     Rate and Rhythm: Normal rate and regular rhythm.     Pulses: Normal pulses.     Heart sounds: Normal heart sounds. No murmur heard. Pulmonary:     Breath sounds: Normal breath sounds. No wheezing or rales.  Abdominal:     Palpations: Abdomen is soft.     Tenderness: There is no abdominal tenderness.  Musculoskeletal:     Cervical back: Neck supple. No rigidity or tenderness.     Right lower leg: No edema.     Left lower leg: No edema.  Skin:    General: Skin is warm.     Findings: No rash.  Neurological:     General: No focal deficit present.     Mental Status: She is alert and oriented to person, place, and time.  Psychiatric:        Mood and Affect: Mood normal.        Behavior: Behavior normal.    BP 124/70 (BP Location: Left Arm, Patient Position: Sitting, Cuff Size: Normal)   Pulse 76   Temp 98 F (36.7 C) (Oral)  Resp 18   Ht 5' 4"  (1.626 m)   SpO2 95%   BMI 27.46 kg/m  Wt Readings from Last 3 Encounters:  09/23/20 160 lb (72.6 kg)  09/15/20 160 lb 3.2 oz (72.7 kg)  07/11/20 187 lb (84.8 kg)     Health Maintenance Due  Topic Date Due   FOOT EXAM  Never done   Hepatitis C Screening  Never done   Zoster Vaccines- Shingrix (2 of 2) 06/07/2018   PNA vac Low Risk Adult (2 of 2 - PPSV23) 02/09/2019   COVID-19 Vaccine (2 -  Moderna risk series) 01/07/2020    There are no preventive care reminders to display for this patient.  Lab Results  Component Value Date   TSH 3.350 06/24/2020   Lab Results  Component Value Date   WBC 7.4 09/15/2020   HGB 11.9 (L) 09/15/2020   HCT 38.4 09/15/2020   MCV 90.1 09/15/2020   PLT 345 09/15/2020   Lab Results  Component Value Date   NA 140 09/15/2020   K 2.7 (LL) 09/15/2020   CO2 28 09/15/2020   GLUCOSE 179 (H) 09/15/2020   BUN 12 09/15/2020   CREATININE 0.85 09/15/2020   BILITOT 1.1 09/15/2020   ALKPHOS 58 09/15/2020   AST 14 (L) 09/15/2020   ALT 11 09/15/2020   PROT 7.1 09/15/2020   ALBUMIN 3.8 09/15/2020   CALCIUM 7.8 (L) 09/15/2020   ANIONGAP 10 09/15/2020   Lab Results  Component Value Date   CHOL 162 04/13/2019   Lab Results  Component Value Date   HDL 40 (L) 04/13/2019   Lab Results  Component Value Date   LDLCALC 97 04/13/2019   Lab Results  Component Value Date   TRIG 157 (H) 04/13/2019   Lab Results  Component Value Date   CHOLHDL 4.1 04/13/2019   Lab Results  Component Value Date   HGBA1C 6.7 06/25/2020      Assessment & Plan:   Problem List Items Addressed This Visit       Cardiovascular and Mediastinum   Hypertension    BP Readings from Last 1 Encounters:  09/24/20 124/70  Well-controlled with Amlodipine, Metoprolol and Losartan-HCTZ Counseled for compliance with the medications Advised low salt diet      Relevant Orders   Basic Metabolic Panel (BMET)   Magnesium     Endocrine   Type 2 diabetes mellitus (HCC)   Relevant Orders   Basic Metabolic Panel (BMET)   Magnesium   Hemoglobin A1c   Hypothyroidism    Lab Results  Component Value Date   TSH 3.350 06/24/2020  On Synthroid 137 mcg QD        Other   Generalized weakness    Could be due to active infection such as UTI Check UA and urine culture Started Bactrim for UTI Advised to maintain adequate hydration and avoid skipping any meal Check BMP  and Mg       Relevant Orders   Basic Metabolic Panel (BMET)   Magnesium   Other Visit Diagnoses     Acute cystitis without hematuria    -  Primary Check UA and urine culture Weakness and foul-smelling urine could be signs of UTI. Started Bactrim for now Advised to increase fluid intake   Relevant Medications   sulfamethoxazole-trimethoprim (BACTRIM DS) 800-160 MG tablet   Other Relevant Orders   UA/M w/rflx Culture, Routine   Need for hepatitis C screening test       Relevant Orders   Hepatitis  C Antibody   Neck pain     Has been taking Tramadol PRN for neck pain and arthralgia with suboptimal response Norco PRN for now If persistent pain or new stiffness, advised to go to ER for possible Meningitis workup   Relevant Medications   HYDROcodone-acetaminophen (NORCO/VICODIN) 5-325 MG tablet       Meds ordered this encounter  Medications   sulfamethoxazole-trimethoprim (BACTRIM DS) 800-160 MG tablet    Sig: Take 1 tablet by mouth 2 (two) times daily.    Dispense:  10 tablet    Refill:  0   HYDROcodone-acetaminophen (NORCO/VICODIN) 5-325 MG tablet    Sig: Take 1 tablet by mouth every 6 (six) hours as needed for moderate pain or severe pain.    Dispense:  30 tablet    Refill:  0    Follow-up: Return in about 3 months (around 12/25/2020) for DM and hypothyroidism.    Lindell Spar, MD

## 2020-09-24 NOTE — Telephone Encounter (Signed)
Daughter called stating patient was very weak and having pain from neck, back, and legs. Daughter stated patient had Dr.'s appointment today with PCP at 2:30.   Called and talked to daughter and directed daughter to take patient to ER to be evaluated. Patients daughter understood and was making arrangements.

## 2020-09-24 NOTE — Patient Instructions (Signed)
Please start taking Bactrim as prescribed for UTI.  Please continue taking other medications as prescribed.  Please avoid skipping any meal. Try to maintain adequate hydration by taking at least 64 ounces of fluid in a day.  If you have worsening of neck stiffness or any other symptoms, please go to ER for evaluation.

## 2020-09-24 NOTE — Assessment & Plan Note (Addendum)
Lab Results  Component Value Date   TSH 3.350 06/24/2020   On Synthroid 137 mcg QD

## 2020-09-25 ENCOUNTER — Inpatient Hospital Stay (HOSPITAL_COMMUNITY)
Admission: EM | Admit: 2020-09-25 | Discharge: 2020-09-29 | DRG: 682 | Disposition: A | Discharge status: Home Health | Payer: Medicare Other | Attending: Family Medicine | Admitting: Family Medicine

## 2020-09-25 ENCOUNTER — Other Ambulatory Visit: Payer: Self-pay

## 2020-09-25 ENCOUNTER — Encounter (HOSPITAL_COMMUNITY): Payer: Self-pay | Admitting: *Deleted

## 2020-09-25 ENCOUNTER — Encounter (HOSPITAL_COMMUNITY): Payer: Self-pay | Admitting: Hematology

## 2020-09-25 DIAGNOSIS — N3001 Acute cystitis with hematuria: Secondary | ICD-10-CM | POA: Diagnosis present

## 2020-09-25 DIAGNOSIS — B964 Proteus (mirabilis) (morganii) as the cause of diseases classified elsewhere: Secondary | ICD-10-CM | POA: Diagnosis present

## 2020-09-25 DIAGNOSIS — H269 Unspecified cataract: Secondary | ICD-10-CM | POA: Diagnosis present

## 2020-09-25 DIAGNOSIS — N1831 Chronic kidney disease, stage 3a: Secondary | ICD-10-CM | POA: Diagnosis present

## 2020-09-25 DIAGNOSIS — Z8616 Personal history of COVID-19: Secondary | ICD-10-CM | POA: Diagnosis not present

## 2020-09-25 DIAGNOSIS — Z66 Do not resuscitate: Secondary | ICD-10-CM | POA: Diagnosis not present

## 2020-09-25 DIAGNOSIS — E1140 Type 2 diabetes mellitus with diabetic neuropathy, unspecified: Secondary | ICD-10-CM | POA: Diagnosis present

## 2020-09-25 DIAGNOSIS — N1832 Chronic kidney disease, stage 3b: Secondary | ICD-10-CM | POA: Diagnosis present

## 2020-09-25 DIAGNOSIS — K5900 Constipation, unspecified: Secondary | ICD-10-CM | POA: Diagnosis not present

## 2020-09-25 DIAGNOSIS — E1122 Type 2 diabetes mellitus with diabetic chronic kidney disease: Secondary | ICD-10-CM | POA: Diagnosis present

## 2020-09-25 DIAGNOSIS — H409 Unspecified glaucoma: Secondary | ICD-10-CM | POA: Diagnosis present

## 2020-09-25 DIAGNOSIS — E8809 Other disorders of plasma-protein metabolism, not elsewhere classified: Secondary | ICD-10-CM | POA: Diagnosis present

## 2020-09-25 DIAGNOSIS — C9000 Multiple myeloma not having achieved remission: Secondary | ICD-10-CM | POA: Diagnosis present

## 2020-09-25 DIAGNOSIS — R5381 Other malaise: Secondary | ICD-10-CM | POA: Diagnosis present

## 2020-09-25 DIAGNOSIS — N179 Acute kidney failure, unspecified: Principal | ICD-10-CM | POA: Diagnosis present

## 2020-09-25 DIAGNOSIS — N183 Chronic kidney disease, stage 3 unspecified: Secondary | ICD-10-CM | POA: Diagnosis present

## 2020-09-25 DIAGNOSIS — Z8261 Family history of arthritis: Secondary | ICD-10-CM

## 2020-09-25 DIAGNOSIS — Z7982 Long term (current) use of aspirin: Secondary | ICD-10-CM

## 2020-09-25 DIAGNOSIS — E785 Hyperlipidemia, unspecified: Secondary | ICD-10-CM | POA: Diagnosis present

## 2020-09-25 DIAGNOSIS — E039 Hypothyroidism, unspecified: Secondary | ICD-10-CM | POA: Diagnosis present

## 2020-09-25 DIAGNOSIS — I129 Hypertensive chronic kidney disease with stage 1 through stage 4 chronic kidney disease, or unspecified chronic kidney disease: Secondary | ICD-10-CM | POA: Diagnosis present

## 2020-09-25 DIAGNOSIS — E860 Dehydration: Secondary | ICD-10-CM | POA: Diagnosis present

## 2020-09-25 DIAGNOSIS — K219 Gastro-esophageal reflux disease without esophagitis: Secondary | ICD-10-CM | POA: Diagnosis present

## 2020-09-25 DIAGNOSIS — Z86010 Personal history of colonic polyps: Secondary | ICD-10-CM

## 2020-09-25 DIAGNOSIS — E46 Unspecified protein-calorie malnutrition: Secondary | ICD-10-CM | POA: Diagnosis present

## 2020-09-25 DIAGNOSIS — Z79890 Hormone replacement therapy (postmenopausal): Secondary | ICD-10-CM

## 2020-09-25 DIAGNOSIS — G8929 Other chronic pain: Secondary | ICD-10-CM | POA: Diagnosis present

## 2020-09-25 DIAGNOSIS — N3281 Overactive bladder: Secondary | ICD-10-CM | POA: Diagnosis present

## 2020-09-25 DIAGNOSIS — U071 COVID-19: Secondary | ICD-10-CM

## 2020-09-25 DIAGNOSIS — I2510 Atherosclerotic heart disease of native coronary artery without angina pectoris: Secondary | ICD-10-CM | POA: Diagnosis present

## 2020-09-25 DIAGNOSIS — R531 Weakness: Secondary | ICD-10-CM

## 2020-09-25 DIAGNOSIS — G9341 Metabolic encephalopathy: Secondary | ICD-10-CM | POA: Diagnosis not present

## 2020-09-25 DIAGNOSIS — M436 Torticollis: Secondary | ICD-10-CM | POA: Diagnosis present

## 2020-09-25 DIAGNOSIS — Z833 Family history of diabetes mellitus: Secondary | ICD-10-CM

## 2020-09-25 DIAGNOSIS — N390 Urinary tract infection, site not specified: Secondary | ICD-10-CM | POA: Diagnosis present

## 2020-09-25 DIAGNOSIS — Z6828 Body mass index (BMI) 28.0-28.9, adult: Secondary | ICD-10-CM

## 2020-09-25 DIAGNOSIS — E119 Type 2 diabetes mellitus without complications: Secondary | ICD-10-CM

## 2020-09-25 DIAGNOSIS — R269 Unspecified abnormalities of gait and mobility: Secondary | ICD-10-CM | POA: Diagnosis present

## 2020-09-25 DIAGNOSIS — I10 Essential (primary) hypertension: Secondary | ICD-10-CM | POA: Diagnosis not present

## 2020-09-25 DIAGNOSIS — Z7984 Long term (current) use of oral hypoglycemic drugs: Secondary | ICD-10-CM

## 2020-09-25 DIAGNOSIS — Z79899 Other long term (current) drug therapy: Secondary | ICD-10-CM

## 2020-09-25 DIAGNOSIS — Z823 Family history of stroke: Secondary | ICD-10-CM

## 2020-09-25 DIAGNOSIS — Z9049 Acquired absence of other specified parts of digestive tract: Secondary | ICD-10-CM

## 2020-09-25 LAB — CBC WITH DIFFERENTIAL/PLATELET
Abs Immature Granulocytes: 0.08 10*3/uL — ABNORMAL HIGH (ref 0.00–0.07)
Basophils Absolute: 0.1 10*3/uL (ref 0.0–0.1)
Basophils Relative: 1 %
Eosinophils Absolute: 0.2 10*3/uL (ref 0.0–0.5)
Eosinophils Relative: 2 %
HCT: 36.6 % (ref 36.0–46.0)
Hemoglobin: 11 g/dL — ABNORMAL LOW (ref 12.0–15.0)
Immature Granulocytes: 1 %
Lymphocytes Relative: 28 %
Lymphs Abs: 2.6 10*3/uL (ref 0.7–4.0)
MCH: 27.8 pg (ref 26.0–34.0)
MCHC: 30.1 g/dL (ref 30.0–36.0)
MCV: 92.4 fL (ref 80.0–100.0)
Monocytes Absolute: 1.1 10*3/uL — ABNORMAL HIGH (ref 0.1–1.0)
Monocytes Relative: 12 %
Neutro Abs: 5.1 10*3/uL (ref 1.7–7.7)
Neutrophils Relative %: 56 %
Platelets: 161 10*3/uL (ref 150–400)
RBC: 3.96 MIL/uL (ref 3.87–5.11)
RDW: 18 % — ABNORMAL HIGH (ref 11.5–15.5)
WBC: 9.1 10*3/uL (ref 4.0–10.5)
nRBC: 0 % (ref 0.0–0.2)

## 2020-09-25 LAB — URINALYSIS, ROUTINE W REFLEX MICROSCOPIC
Bilirubin Urine: NEGATIVE
Glucose, UA: NEGATIVE mg/dL
Ketones, ur: 5 mg/dL — AB
Nitrite: NEGATIVE
Protein, ur: 100 mg/dL — AB
Specific Gravity, Urine: 1.014 (ref 1.005–1.030)
WBC, UA: >50 WBC/hpf — ABNORMAL HIGH (ref 0–5)
pH: 7 (ref 5.0–8.0)

## 2020-09-25 LAB — COMPREHENSIVE METABOLIC PANEL
ALT: 17 U/L (ref 0–44)
AST: 37 U/L (ref 15–41)
Albumin: 3.4 g/dL — ABNORMAL LOW (ref 3.5–5.0)
Alkaline Phosphatase: 71 U/L (ref 38–126)
Anion gap: 11 (ref 5–15)
BUN: 33 mg/dL — ABNORMAL HIGH (ref 8–23)
CO2: 23 mmol/L (ref 22–32)
Calcium: 6.5 mg/dL — ABNORMAL LOW (ref 8.9–10.3)
Chloride: 99 mmol/L (ref 98–111)
Creatinine, Ser: 3.39 mg/dL — ABNORMAL HIGH (ref 0.44–1.00)
GFR, Estimated: 13 mL/min — ABNORMAL LOW (ref >60)
Glucose, Bld: 167 mg/dL — ABNORMAL HIGH (ref 70–99)
Potassium: 3.6 mmol/L (ref 3.5–5.1)
Sodium: 133 mmol/L — ABNORMAL LOW (ref 135–145)
Total Bilirubin: 1.1 mg/dL (ref 0.3–1.2)
Total Protein: 7.1 g/dL (ref 6.5–8.1)

## 2020-09-25 LAB — BASIC METABOLIC PANEL
BUN/Creatinine Ratio: 15 (ref 12–28)
BUN: 21 mg/dL (ref 8–27)
CO2: 22 mmol/L (ref 20–29)
Calcium: 7.6 mg/dL — ABNORMAL LOW (ref 8.7–10.3)
Chloride: 99 mmol/L (ref 96–106)
Creatinine, Ser: 1.37 mg/dL — ABNORMAL HIGH (ref 0.57–1.00)
Glucose: 195 mg/dL — ABNORMAL HIGH (ref 65–99)
Potassium: 3.7 mmol/L (ref 3.5–5.2)
Sodium: 137 mmol/L (ref 134–144)
eGFR: 40 mL/min/{1.73_m2} — ABNORMAL LOW (ref >59)

## 2020-09-25 LAB — MAGNESIUM: Magnesium: 1.6 mg/dL (ref 1.6–2.3)

## 2020-09-25 LAB — RESP PANEL BY RT-PCR (FLU A&B, COVID) ARPGX2
Influenza A by PCR: NEGATIVE
Influenza B by PCR: NEGATIVE
SARS Coronavirus 2 by RT PCR: POSITIVE — AB

## 2020-09-25 LAB — HEMOGLOBIN A1C
Est. average glucose Bld gHb Est-mCnc: 146 mg/dL
Hgb A1c MFr Bld: 6.7 % — ABNORMAL HIGH (ref 4.8–5.6)

## 2020-09-25 LAB — LACTIC ACID, PLASMA: Lactic Acid, Venous: 1.9 mmol/L (ref 0.5–1.9)

## 2020-09-25 LAB — HEPATITIS C ANTIBODY: Hep C Virus Ab: <0.1 s/co ratio (ref 0.0–0.9)

## 2020-09-25 MED ORDER — SODIUM CHLORIDE 0.9 % IV BOLUS
1000.0000 mL | Freq: Once | INTRAVENOUS | Status: AC
  Administered 2020-09-25: 1000 mL via INTRAVENOUS

## 2020-09-25 MED ORDER — SODIUM CHLORIDE 0.9 % IV SOLN
1.0000 g | Freq: Once | INTRAVENOUS | Status: AC
  Administered 2020-09-25: 1 g via INTRAVENOUS
  Filled 2020-09-25: qty 10

## 2020-09-25 NOTE — ED Provider Notes (Signed)
[facility] EMERGENCY DEPARTMENT Provider Note   CSN: [id number] Arrival date & time: 09/25/20  1651     History Chief Complaint  Patient presents with   Fatigue    Kim Mills is a 80 y.o. female with a history of hypertension, hyperlipidemia, diabetes and is actively undergoing chemotherapy for multiple myeloma presenting for evaluation of objective fevers along with generalized weakness and reduced urination today.  She endorses having some dysuria yesterday, in fact was seen by her PCP but at that time she was unable to provide a urine sample.  She was treated for symptoms suggestive of an acute UTI, was prescribed Bactrim, however the patient has yet to start this medication.  Today she has had increasing sleepiness and generalized weakness.  She lives at home with her daughter is her primary caregiver and daughter states she had a great difficulty helping her to stand and ambulate today.  Patient denies any complaints of pain although endorses having some soreness along her right neck and shoulder region.  She denies headache, chest pain, shortness of breath, also denies abdominal pain, nausea or vomiting.  She has had no diarrhea.  She has had a poor appetite today and it has been more than 8 hours since she has urinated.  Daughter at the bedside states that she tested positive for COVID-19 about 3 weeks ago and other than having some mild fatigue had no other complaints.  She tested -10 days after the initial test.  The history is provided by a relative (daughter, primary caregiver at bedside.).      Past Medical History:  Diagnosis Date   Arthritis    Cancer [facility])    Cataract    Chest pain    Associated with weakness and fatigue   Diabetes mellitus    A1c of 7.4 in 08/2010   GERD (gastroesophageal reflux disease)    Glaucoma    Hyperlipidemia    Lipid profile in 08/2010:190, 121, 48, 118; normal CBC and CMet   Hypertension    Lipid profile in 08/2010:190, 121, 48, 118;  normal CBC and CMet   Hypothyroidism    Hypothyroidism    Obesity     Patient Active Problem List   Diagnosis Date Noted   Generalized weakness 09/24/2020   Overactive bladder 05/13/2020   Multiple myeloma ([place]) 10/13/2019   Goals of care, counseling/discussion 10/13/2019   Multiple myeloma not having achieved remission ([place]) 10/13/2019   Monoclonal gammopathy 09/12/2019   CKD (chronic kidney disease) stage 3, GFR 30-59 ml/min (HCC) 04/18/2019   Vitamin D deficiency 04/17/2019   B12 deficiency 04/12/2018   Normocytic anemia 04/12/2018   Carotid artery disease without cerebral infarction ([place]) 04/12/2018   Gait disorder 01/11/2018   Diabetic neuropathy ([place]) 01/10/2018   CAD (coronary artery disease) 11/23/2017   SVT (supraventricular tachycardia) ([place]) 10/05/2017   History of colonic polyps    Constipation 03/13/2014   GERD (gastroesophageal reflux disease) 03/13/2014   Hyperlipidemia    Type 2 diabetes mellitus (HCC)    OA (osteoarthritis) of knee    Hypothyroidism    Obesity    Hypertension     Past Surgical History:  Procedure Laterality Date   CHOLECYSTECTOMY     COLONOSCOPY N/A 04/01/2014   RMR: Melanosis coli. colonic polyps removed as described above.    CYSTOSCOPY W/ URETERAL STENT PLACEMENT Right 09/13/2019   Procedure: CYSTOSCOPY WITH RIGHT  RETROGRADE PYELOGRAM; BLADDER BIOPSY;  Surgeon: Cleon Gustin, MD;  Location: [facility];  Service:  Urology;  Laterality: Right;   LEFT HEART CATH AND CORONARY ANGIOGRAPHY N/A 10/07/2017   Procedure: LEFT HEART CATH AND CORONARY ANGIOGRAPHY;  Surgeon: Troy Sine, MD;  Location: [place] [facility];  Service: Cardiovascular;  Laterality: N/A;   THYROIDECTOMY, PARTIAL       OB History   No obstetric history on file.     Family History  Problem Relation Age of Onset   Anemia Father    Arthritis Mother    Stroke Brother    Cancer Sister        unknown kind   Diabetes Sister    Dementia Sister    Cancer Niece         Breast   Cancer Brother    Healthy Son    Healthy Son    Healthy Son    Healthy Daughter    Healthy Daughter    Healthy Daughter    Sarcoidosis Daughter     Social History   Tobacco Use   Smoking status: Never    Passive exposure: Yes   Smokeless tobacco: Never  Vaping Use   Vaping Use: Never used  Substance Use Topics   Alcohol use: Never   Drug use: Never    Home Medications Prior to Admission medications   Medication Sig Start Date End Date Taking? Authorizing Provider  acyclovir (ZOVIRAX) 400 MG tablet Take 1 tablet (400 mg total) by mouth 2 (two) times daily. 10/26/19  Yes Derek Jack, MD  amLODipine (NORVASC) 10 MG tablet Take 1 tablet (10 mg total) by mouth daily. 04/20/19  Yes [place], Modena Nunnery, MD  ASPIRIN 81 PO Take 1 tablet by mouth daily.   Yes [provider]  Blood Glucose Monitoring Suppl (BLOOD GLUCOSE SYSTEM PAK) KIT Please dispense based on patient and insurance preference. Use as directed to monitor FSBS 2x daily. Dx: E11.9. 04/12/18  Yes [place], Modena Nunnery, MD  Calcium 500 MG tablet Take 600 mg by mouth 2 (two) times daily.   Yes [provider]  gabapentin (NEURONTIN) 300 MG capsule TAKE ONE CAPSULE BY MOUTH EVERY NIGHT AT BEDTIME 05/06/20  Yes Patel, Colin Broach, MD  glipiZIDE (GLUCOTROL) 5 MG tablet TAKE ONE TABLET (5MG TOTAL) BY MOUTH TWOTIMES DAILY Patient taking differently: Take 5 mg by mouth 2 (two) times daily before a meal. 03/10/20  Yes [place], Modena Nunnery, MD  Glucose Blood (BLOOD GLUCOSE TEST STRIPS) STRP Please dispense based on patient and insurance preference. Use as directed to monitor FSBS 2x daily. Dx: E11.9. 04/12/18  Yes [place], Modena Nunnery, MD  HM LORATADINE 10 MG tablet TAKE ONE TABLET (10MG TOTAL) BY MOUTH DAILY Patient taking differently: Take 10 mg by mouth daily as needed for allergies. 10/05/19  Yes [place], Modena Nunnery, MD  HYDROcodone-acetaminophen (NORCO/VICODIN) 5-325 MG tablet Take 1 tablet by mouth every 6 (six)  hours as needed for moderate pain or severe pain. 09/24/20  Yes Lindell Spar, MD  Lancets MISC Please dispense based on patient and insurance preference. Use as directed to monitor FSBS 2x daily. Dx: E11.9. 04/12/18  Yes [place], Modena Nunnery, MD  lenalidomide (REVLIMID) 10 MG capsule Take 1 capsule (10 mg total) by mouth daily. 14 days on, 7 days off 09/16/20  Yes Derek Jack, MD  losartan-hydrochlorothiazide [facility]) 50-12.5 MG tablet TAKE ONE (1) TABLET BY MOUTH EVERY DAY 09/24/20  Yes Lindell Spar, MD  magic mouthwash w/lidocaine SOLN Take 5 mLs by mouth 4 (four) times daily. 01/08/20  Yes Derek Jack,  MD  magnesium oxide (MAG-OX) 400 (240 Mg) MG tablet TAKE ONE TABLET (400MG TOTAL) BY MOUTH TWO TIMES DAILY Patient taking differently: Take 400 mg by mouth 2 (two) times daily. 08/07/20  Yes [place], Rebekah M, PA-C  meclizine (ANTIVERT) 25 MG tablet Take 25 mg by mouth every 6 (six) hours as needed for dizziness.    Yes [provider]  metFORMIN (GLUCOPHAGE) 1000 MG tablet TAKE ONE TABLET BY MOUTH TWICE A DAY 08/07/20  Yes Lindell Spar, MD  metoprolol tartrate (LOPRESSOR) 25 MG tablet TAKE ONE-HALF TABLET (12.5MG TOTAL) BY MOUTH TWO TIMES DAILY Patient taking differently: Take 12.5 mg by mouth 2 (two) times daily. 06/19/20  Yes Lindell Spar, MD  omeprazole (PRILOSEC) 40 MG capsule TAKE ONE CAPSULE BY MOUTH DAILY 04/03/20  Yes [place], Modena Nunnery, MD  potassium chloride SA (KLOR-CON) 20 MEQ tablet Take 1 tablet (20 mEq total) by mouth 2 (two) times daily. 12/28/19  Yes Jacquelin Hawking, NP  prochlorperazine (COMPAZINE) 10 MG tablet Take 1 tablet (10 mg total) by mouth every 6 (six) hours as needed (Nausea or vomiting). 10/26/19  Yes Derek Jack, MD  simvastatin (ZOCOR) 40 MG tablet TAKE ONE TABLET (40MG TOTAL) BY MOUTH BEDTIME Patient taking differently: Take 40 mg by mouth at bedtime. 03/10/20  Yes [place], Modena Nunnery, MD  sulfamethoxazole-trimethoprim (BACTRIM DS)  800-160 MG tablet Take 1 tablet by mouth 2 (two) times daily. 09/24/20  Yes Patel, Colin Broach, MD  SYNTHROID 137 MCG tablet TAKE ONE TABLET (137MCG TOTAL) BY MOUTH DAILY BEFORE BREAKFAST Patient taking differently: Take 137 mcg by mouth daily before breakfast. 09/24/20  Yes Patel, Colin Broach, MD  traMADol (ULTRAM) 50 MG tablet TAKE ONE TABLET (50MG TOTAL) BY MOUTH TWO TIMES DAILY AS NEEDED Patient taking differently: Take 50 mg by mouth 2 (two) times daily as needed for moderate pain. 06/02/20  Yes Derek Jack, MD  fluticasone (FLONASE) 50 MCG/ACT nasal spray USE TWO SPRAYS IN BOTH NOSTRILS DAILY Patient taking differently: Place 2 sprays into both nostrils daily. 09/11/20   Lindell Spar, MD  tolterodine (DETROL LA) 4 MG 24 hr capsule TAKE ONE CAPSULE BY MOUTH DAILY Patient not taking: No sig reported 04/24/19   Alycia Rossetti, MD    Allergies    Patient has no known allergies.  Review of Systems   Review of Systems  Constitutional:  Positive for fatigue and fever. Negative for chills.  HENT:  Negative for congestion and sore throat.   Eyes: Negative.   Respiratory:  Negative for chest tightness and shortness of breath.   Cardiovascular:  Negative for chest pain.  Gastrointestinal:  Negative for abdominal pain, nausea and vomiting.  Genitourinary:  Positive for decreased urine volume and difficulty urinating.  Musculoskeletal:  Positive for neck pain. Negative for arthralgias, joint swelling and neck stiffness.  Skin: Negative.  Negative for rash and wound.  Neurological:  Positive for weakness. Negative for dizziness, light-headedness, numbness and headaches.  Psychiatric/Behavioral: Negative.     Physical Exam Updated Vital Signs BP (!) 128/51   Pulse 79   Temp 100.2 F (37.9 C) (Oral)   Resp 16   SpO2 100%   Physical Exam Vitals and nursing note reviewed.  Constitutional:      Appearance: She is well-developed.     Comments: Eyes closed, appears to be sleeping but  wakes easily and responds to questions appropriately  HENT:     Head: Normocephalic and atraumatic.     Mouth/Throat:  Mouth: Mucous membranes are dry.  Eyes:     Pupils: Pupils are equal, round, and reactive to light.     Comments: No photophobia  Neck:     Comments: Mild ttp right trapezius and paracervical musculature.  ROM intact.  Cardiovascular:     Rate and Rhythm: Normal rate and regular rhythm.     Heart sounds: Normal heart sounds.  Pulmonary:     Effort: Pulmonary effort is normal.     Breath sounds: Normal breath sounds. No wheezing.  Abdominal:     General: Bowel sounds are normal.     Palpations: Abdomen is soft.     Tenderness: There is no abdominal tenderness. There is no guarding.  Musculoskeletal:        General: Normal range of motion.     Cervical back: Normal range of motion.  Skin:    General: Skin is warm and dry.     Coloration: Skin is pale.  Neurological:     General: No focal deficit present.     Cranial Nerves: No cranial nerve deficit.     Motor: Weakness present.     Comments: General weakness. Moves all extremities without deficit.  equal grip strength.     ED Results / Procedures / Treatments   Labs (all labs ordered are listed, but only abnormal results are displayed) Labs Reviewed  RESP PANEL BY RT-PCR (FLU A&B, COVID) ARPGX2 - Abnormal; Notable for the following components:      Result Value   SARS Coronavirus 2 by RT PCR POSITIVE (*)    All other components within normal limits  CBC WITH DIFFERENTIAL/PLATELET - Abnormal; Notable for the following components:   Hemoglobin 11.0 (*)    RDW 18.0 (*)    Monocytes Absolute 1.1 (*)    Abs Immature Granulocytes 0.08 (*)    All other components within normal limits  COMPREHENSIVE METABOLIC PANEL - Abnormal; Notable for the following components:   Sodium 133 (*)    Glucose, Bld 167 (*)    BUN 33 (*)    Creatinine, Ser 3.39 (*)    Calcium 6.5 (*)    Albumin 3.4 (*)    GFR, Estimated 13  (*)    All other components within normal limits  URINALYSIS, ROUTINE W REFLEX MICROSCOPIC - Abnormal; Notable for the following components:   APPearance TURBID (*)    Hgb urine dipstick SMALL (*)    Ketones, ur 5 (*)    Protein, ur 100 (*)    Leukocytes,Ua LARGE (*)    WBC, UA >50 (*)    Bacteria, UA MANY (*)    All other components within normal limits  CULTURE, BLOOD (ROUTINE X 2)  CULTURE, BLOOD (ROUTINE X 2)  URINE CULTURE  LACTIC ACID, PLASMA    EKG None  Radiology No results found.  Procedures Procedures   Medications Ordered in ED Medications  sodium chloride 0.9 % bolus 1,000 mL (0 mLs Intravenous Stopped 09/25/20 2137)  cefTRIAXone (ROCEPHIN) 1 g in sodium chloride 0.9 % 100 mL IVPB (1 g Intravenous New Bag/Given 09/25/20 2306)    ED Course  I have reviewed the triage vital signs and the nursing notes.  Pertinent labs & imaging results that were available during my care of the patient were reviewed by me and considered in my medical decision making (see chart for details).    MDM Rules/Calculators/A&P  Patient with febrile illness, increasing weakness and reduced urination with dysuria yesterday.  She does have a UTI, also with acute kidney injury secondary to dehydration.  She was given IV fluids here, also given IV Rocephin, urine cultures have been sent, blood cultures also pending.  Her lactic acid is normal.  She does have a positive COVID-19 test today, however she was +3 weeks ago per daughter at the bedside but then tested -10 days later.  It is unclear whether she currently has an active COVID-19 infection.  She denies shortness of breath, chest pain, cough, sore throat.  She does not have exam fndings to suggest meningitis, right neck pain which is reproducible, fair c spine ROM, no headache or photophobia.  Discussed with Dr. Clearence Ped who accepts pt for admission.    Final Clinical Impression(s) / ED Diagnoses Final  diagnoses:  Acute cystitis with hematuria  Weakness  COVID-19  Acute kidney injury [facility])  Dehydration    Rx / DC Orders ED Discharge Orders     None        Landis Martins 09/26/20 0016    Wyvonnia Dusky, MD 09/26/20 [phone]

## 2020-09-25 NOTE — ED Notes (Signed)
Date and time results received: 09/25/20 2240  Test: Covid Critical Value: Positive  Name of Provider Notified: Evalee Jefferson, PA

## 2020-09-25 NOTE — ED Triage Notes (Signed)
Weakness with fever. Family member states she has had difficulty with urination today. Also is being treated for multiple myeloma

## 2020-09-26 ENCOUNTER — Emergency Department (HOSPITAL_COMMUNITY): Payer: Medicare Other

## 2020-09-26 ENCOUNTER — Encounter (HOSPITAL_COMMUNITY): Payer: Self-pay | Admitting: Family Medicine

## 2020-09-26 DIAGNOSIS — E1143 Type 2 diabetes mellitus with diabetic autonomic (poly)neuropathy: Secondary | ICD-10-CM

## 2020-09-26 DIAGNOSIS — R509 Fever, unspecified: Secondary | ICD-10-CM | POA: Diagnosis not present

## 2020-09-26 DIAGNOSIS — N390 Urinary tract infection, site not specified: Secondary | ICD-10-CM | POA: Diagnosis present

## 2020-09-26 DIAGNOSIS — E785 Hyperlipidemia, unspecified: Secondary | ICD-10-CM | POA: Diagnosis present

## 2020-09-26 DIAGNOSIS — E782 Mixed hyperlipidemia: Secondary | ICD-10-CM | POA: Diagnosis not present

## 2020-09-26 DIAGNOSIS — Z8616 Personal history of COVID-19: Secondary | ICD-10-CM | POA: Diagnosis not present

## 2020-09-26 DIAGNOSIS — E1122 Type 2 diabetes mellitus with diabetic chronic kidney disease: Secondary | ICD-10-CM | POA: Diagnosis present

## 2020-09-26 DIAGNOSIS — N3281 Overactive bladder: Secondary | ICD-10-CM | POA: Diagnosis present

## 2020-09-26 DIAGNOSIS — J439 Emphysema, unspecified: Secondary | ICD-10-CM | POA: Diagnosis not present

## 2020-09-26 DIAGNOSIS — N179 Acute kidney failure, unspecified: Secondary | ICD-10-CM | POA: Diagnosis present

## 2020-09-26 DIAGNOSIS — R5381 Other malaise: Secondary | ICD-10-CM | POA: Diagnosis not present

## 2020-09-26 DIAGNOSIS — Z743 Need for continuous supervision: Secondary | ICD-10-CM | POA: Diagnosis not present

## 2020-09-26 DIAGNOSIS — Z823 Family history of stroke: Secondary | ICD-10-CM | POA: Diagnosis not present

## 2020-09-26 DIAGNOSIS — K5900 Constipation, unspecified: Secondary | ICD-10-CM | POA: Diagnosis not present

## 2020-09-26 DIAGNOSIS — E46 Unspecified protein-calorie malnutrition: Secondary | ICD-10-CM | POA: Diagnosis present

## 2020-09-26 DIAGNOSIS — E860 Dehydration: Secondary | ICD-10-CM | POA: Diagnosis present

## 2020-09-26 DIAGNOSIS — R531 Weakness: Secondary | ICD-10-CM

## 2020-09-26 DIAGNOSIS — E039 Hypothyroidism, unspecified: Secondary | ICD-10-CM | POA: Diagnosis present

## 2020-09-26 DIAGNOSIS — M436 Torticollis: Secondary | ICD-10-CM | POA: Diagnosis present

## 2020-09-26 DIAGNOSIS — B964 Proteus (mirabilis) (morganii) as the cause of diseases classified elsewhere: Secondary | ICD-10-CM | POA: Diagnosis present

## 2020-09-26 DIAGNOSIS — Z6828 Body mass index (BMI) 28.0-28.9, adult: Secondary | ICD-10-CM | POA: Diagnosis not present

## 2020-09-26 DIAGNOSIS — I2510 Atherosclerotic heart disease of native coronary artery without angina pectoris: Secondary | ICD-10-CM | POA: Diagnosis present

## 2020-09-26 DIAGNOSIS — N1832 Chronic kidney disease, stage 3b: Secondary | ICD-10-CM | POA: Diagnosis present

## 2020-09-26 DIAGNOSIS — G9341 Metabolic encephalopathy: Secondary | ICD-10-CM | POA: Diagnosis present

## 2020-09-26 DIAGNOSIS — I10 Essential (primary) hypertension: Secondary | ICD-10-CM | POA: Diagnosis not present

## 2020-09-26 DIAGNOSIS — C9000 Multiple myeloma not having achieved remission: Secondary | ICD-10-CM | POA: Diagnosis present

## 2020-09-26 DIAGNOSIS — I129 Hypertensive chronic kidney disease with stage 1 through stage 4 chronic kidney disease, or unspecified chronic kidney disease: Secondary | ICD-10-CM | POA: Diagnosis present

## 2020-09-26 DIAGNOSIS — E8809 Other disorders of plasma-protein metabolism, not elsewhere classified: Secondary | ICD-10-CM | POA: Diagnosis present

## 2020-09-26 DIAGNOSIS — E1140 Type 2 diabetes mellitus with diabetic neuropathy, unspecified: Secondary | ICD-10-CM | POA: Diagnosis present

## 2020-09-26 DIAGNOSIS — N3001 Acute cystitis with hematuria: Secondary | ICD-10-CM | POA: Diagnosis present

## 2020-09-26 DIAGNOSIS — Z66 Do not resuscitate: Secondary | ICD-10-CM | POA: Diagnosis present

## 2020-09-26 LAB — COMPREHENSIVE METABOLIC PANEL
ALT: 14 U/L (ref 0–44)
AST: 28 U/L (ref 15–41)
Albumin: 2.7 g/dL — ABNORMAL LOW (ref 3.5–5.0)
Alkaline Phosphatase: 61 U/L (ref 38–126)
Anion gap: 8 (ref 5–15)
BUN: 35 mg/dL — ABNORMAL HIGH (ref 8–23)
CO2: 23 mmol/L (ref 22–32)
Calcium: 6.6 mg/dL — ABNORMAL LOW (ref 8.9–10.3)
Chloride: 104 mmol/L (ref 98–111)
Creatinine, Ser: 2.31 mg/dL — ABNORMAL HIGH (ref 0.44–1.00)
GFR, Estimated: 21 mL/min — ABNORMAL LOW (ref >60)
Glucose, Bld: 176 mg/dL — ABNORMAL HIGH (ref 70–99)
Potassium: 3.8 mmol/L (ref 3.5–5.1)
Sodium: 135 mmol/L (ref 135–145)
Total Bilirubin: 1 mg/dL (ref 0.3–1.2)
Total Protein: 6.1 g/dL — ABNORMAL LOW (ref 6.5–8.1)

## 2020-09-26 LAB — CBC WITH DIFFERENTIAL/PLATELET
Abs Immature Granulocytes: 0.07 10*3/uL (ref 0.00–0.07)
Basophils Absolute: 0.1 10*3/uL (ref 0.0–0.1)
Basophils Relative: 1 %
Eosinophils Absolute: 0.1 10*3/uL (ref 0.0–0.5)
Eosinophils Relative: 1 %
HCT: 32.1 % — ABNORMAL LOW (ref 36.0–46.0)
Hemoglobin: 9.9 g/dL — ABNORMAL LOW (ref 12.0–15.0)
Immature Granulocytes: 1 %
Lymphocytes Relative: 23 %
Lymphs Abs: 1.8 10*3/uL (ref 0.7–4.0)
MCH: 28.5 pg (ref 26.0–34.0)
MCHC: 30.8 g/dL (ref 30.0–36.0)
MCV: 92.5 fL (ref 80.0–100.0)
Monocytes Absolute: 1.1 10*3/uL — ABNORMAL HIGH (ref 0.1–1.0)
Monocytes Relative: 14 %
Neutro Abs: 5 10*3/uL (ref 1.7–7.7)
Neutrophils Relative %: 60 %
Platelets: 127 10*3/uL — ABNORMAL LOW (ref 150–400)
RBC: 3.47 MIL/uL — ABNORMAL LOW (ref 3.87–5.11)
RDW: 17.7 % — ABNORMAL HIGH (ref 11.5–15.5)
WBC: 8.2 10*3/uL (ref 4.0–10.5)
nRBC: 0 % (ref 0.0–0.2)

## 2020-09-26 LAB — TSH: TSH: 3.414 u[IU]/mL (ref 0.350–4.500)

## 2020-09-26 LAB — CBG MONITORING, ED
Glucose-Capillary: 146 mg/dL — ABNORMAL HIGH (ref 70–99)
Glucose-Capillary: 156 mg/dL — ABNORMAL HIGH (ref 70–99)
Glucose-Capillary: 105 mg/dL — ABNORMAL HIGH (ref 70–99)

## 2020-09-26 LAB — GLUCOSE, CAPILLARY
Glucose-Capillary: 99 mg/dL (ref 70–99)
Glucose-Capillary: 103 mg/dL — ABNORMAL HIGH (ref 70–99)

## 2020-09-26 LAB — MAGNESIUM: Magnesium: 1.6 mg/dL — ABNORMAL LOW (ref 1.7–2.4)

## 2020-09-26 MED ORDER — GABAPENTIN 300 MG PO CAPS
300.0000 mg | ORAL_CAPSULE | Freq: Every day | ORAL | Status: DC
  Administered 2020-09-26 – 2020-09-28 (×3): 300 mg via ORAL
  Filled 2020-09-26 (×3): qty 1

## 2020-09-26 MED ORDER — SODIUM CHLORIDE 0.9 % IV SOLN: INTRAVENOUS | Status: DC

## 2020-09-26 MED ORDER — HEPARIN SODIUM (PORCINE) 5000 UNIT/ML IJ SOLN
5000.0000 [IU] | Freq: Three times a day (TID) | INTRAMUSCULAR | Status: DC
  Administered 2020-09-26 – 2020-09-29 (×8): 5000 [IU] via SUBCUTANEOUS
  Filled 2020-09-26 (×11): qty 1

## 2020-09-26 MED ORDER — ACETAMINOPHEN 325 MG PO TABS: 650.0000 mg | ORAL_TABLET | Freq: Four times a day (QID) | ORAL | Status: DC | PRN

## 2020-09-26 MED ORDER — DIAZEPAM 5 MG PO TABS: 5.0000 mg | ORAL_TABLET | Freq: Once | ORAL | Status: DC

## 2020-09-26 MED ORDER — MELATONIN 3 MG PO TABS
6.0000 mg | ORAL_TABLET | Freq: Every day | ORAL | Status: DC
  Administered 2020-09-26 – 2020-09-28 (×3): 6 mg via ORAL
  Filled 2020-09-26 (×3): qty 2

## 2020-09-26 MED ORDER — DIAZEPAM 2 MG PO TABS
2.0000 mg | ORAL_TABLET | Freq: Three times a day (TID) | ORAL | Status: DC
  Administered 2020-09-26 – 2020-09-29 (×10): 2 mg via ORAL
  Filled 2020-09-26 (×10): qty 1

## 2020-09-26 MED ORDER — ONDANSETRON HCL 4 MG PO TABS
4.0000 mg | ORAL_TABLET | Freq: Four times a day (QID) | ORAL | Status: DC | PRN
  Administered 2020-09-26: 4 mg via ORAL
  Filled 2020-09-26 (×2): qty 1

## 2020-09-26 MED ORDER — SODIUM CHLORIDE 0.9 % IV SOLN
1.0000 g | INTRAVENOUS | Status: DC
  Administered 2020-09-26 – 2020-09-28 (×3): 1 g via INTRAVENOUS
  Filled 2020-09-26 (×3): qty 10

## 2020-09-26 MED ORDER — ASPIRIN EC 81 MG PO TBEC
81.0000 mg | DELAYED_RELEASE_TABLET | Freq: Every day | ORAL | Status: DC
  Administered 2020-09-26 – 2020-09-29 (×4): 81 mg via ORAL
  Filled 2020-09-26 (×4): qty 1

## 2020-09-26 MED ORDER — CALCIUM GLUCONATE-NACL 1-0.675 GM/50ML-% IV SOLN
1.0000 g | Freq: Once | INTRAVENOUS | Status: AC
  Administered 2020-09-26: 1000 mg via INTRAVENOUS
  Filled 2020-09-26: qty 50

## 2020-09-26 MED ORDER — INSULIN ASPART 100 UNIT/ML IJ SOLN
0.0000 [IU] | Freq: Three times a day (TID) | INTRAMUSCULAR | Status: DC
  Administered 2020-09-26: 3 [IU] via SUBCUTANEOUS
  Administered 2020-09-27 – 2020-09-29 (×4): 2 [IU] via SUBCUTANEOUS
  Filled 2020-09-26: qty 1

## 2020-09-26 MED ORDER — ALBUTEROL SULFATE HFA 108 (90 BASE) MCG/ACT IN AERS: 2.0000 | INHALATION_SPRAY | RESPIRATORY_TRACT | Status: DC | PRN

## 2020-09-26 MED ORDER — SIMVASTATIN 20 MG PO TABS
40.0000 mg | ORAL_TABLET | Freq: Every day | ORAL | Status: DC
  Administered 2020-09-26 – 2020-09-28 (×3): 40 mg via ORAL
  Filled 2020-09-26 (×3): qty 2

## 2020-09-26 MED ORDER — ONDANSETRON HCL 4 MG/2ML IJ SOLN: 4.0000 mg | Freq: Four times a day (QID) | INTRAMUSCULAR | Status: DC | PRN

## 2020-09-26 MED ORDER — ACETAMINOPHEN 650 MG RE SUPP: 650.0000 mg | Freq: Four times a day (QID) | RECTAL | Status: DC | PRN

## 2020-09-26 MED ORDER — OXYCODONE HCL 5 MG PO TABS
5.0000 mg | ORAL_TABLET | ORAL | Status: DC | PRN
  Administered 2020-09-26 – 2020-09-29 (×9): 5 mg via ORAL
  Filled 2020-09-26 (×10): qty 1

## 2020-09-26 MED ORDER — AMLODIPINE BESYLATE 5 MG PO TABS: 10.0000 mg | ORAL_TABLET | Freq: Every day | ORAL | Status: DC

## 2020-09-26 MED ORDER — CYCLOBENZAPRINE HCL 10 MG PO TABS
5.0000 mg | ORAL_TABLET | Freq: Three times a day (TID) | ORAL | Status: DC | PRN
  Administered 2020-09-26 (×2): 5 mg via ORAL
  Filled 2020-09-26 (×2): qty 1

## 2020-09-26 MED ORDER — MAGNESIUM SULFATE 2 GM/50ML IV SOLN
2.0000 g | Freq: Once | INTRAVENOUS | Status: AC
  Administered 2020-09-26: 2 g via INTRAVENOUS
  Filled 2020-09-26: qty 50

## 2020-09-26 MED ORDER — LEVOTHYROXINE SODIUM 137 MCG PO TABS
137.0000 ug | ORAL_TABLET | Freq: Every day | ORAL | Status: DC
  Administered 2020-09-26 – 2020-09-29 (×4): 137 ug via ORAL
  Filled 2020-09-26 (×4): qty 1

## 2020-09-26 MED ORDER — INSULIN ASPART 100 UNIT/ML IJ SOLN: 0.0000 [IU] | Freq: Every day | INTRAMUSCULAR | Status: DC

## 2020-09-26 MED ORDER — METOPROLOL TARTRATE 25 MG PO TABS: 12.5000 mg | ORAL_TABLET | Freq: Two times a day (BID) | ORAL | Status: DC

## 2020-09-26 MED ORDER — METOPROLOL TARTRATE 25 MG PO TABS
12.5000 mg | ORAL_TABLET | Freq: Two times a day (BID) | ORAL | Status: DC
  Administered 2020-09-27 – 2020-09-29 (×3): 12.5 mg via ORAL
  Filled 2020-09-26 (×4): qty 1

## 2020-09-26 NOTE — H&P (Signed)
TRH H&P    Patient Demographics:    Kim Mills, is a 80 y.o. female  MRN: [id number]  DOB - 1940-12-17  Admit Date - 09/25/2020  Referring MD/NP/PA: Alfonzo Feller  Outpatient Primary MD for the patient is [place] Spar, MD  Patient coming from: Home  Chief complaint- altered mental status and generalized weakness   HPI:    Kim Mills  is a 80 y.o. female, with history of multiple myeloma, diabetes mellitus type 2, GERD, hyperlipidemia, hypertension, hypothyroidism presents the ED with a chief complaint of generalized weakness and altered mental status.  Patient is not able to provide any history.  Daughter reports that on the 12th patient started feeling very exhausted.  This went on for a few days without progressing.  Then on the 15th patient was more fatigued, and complaining of right-sided neck stiffness that radiated down to her shoulder and then all the way down to her legs.  She reports that mother could not turn her head.  Symptoms continued to progress and patient became weak, unable to assist with ambulation.  At baseline patient is able to walk with her walker about 10 feet.  She uses her wheelchair most of the time.  She is usually able to help move herself in the bed, and up to the toilet.  Daughter reports that they made an appointment to go see his PCP.  Patient was complaining of dysuria at that time.  They were not able to get a urine sample, but had plan to treat empirically for UTI.  Prescription was written for Bactrim, but was not filled.  Blood work was done, and patient was sent to the ED today for abnormal labs.  Daughter reports that today patient had more of the same symptoms, but progressively worse.  She was not able to stand up at all.  With assistance she still was eased down to the ground-without an actual fall demonstrated she was not strong enough to stand up.  They had to get a lift to  get her back into the bed.  Daughter reports the patient was not eating today.  She was sleeping all day.  Her p.o. intake has been decreased for couple months, and much worse over the last week.  Daughter reports that patient did not urinate all day today.  She was speaking incoherently.  When they got the call from the doctor's office they came straight in.  Patient is vaccinated for COVID with 2 shots.  She was post to get a booster later today.  She had a positive COVID test 30 days ago, negative COVID test 10 days ago, and then a positive COVID test today.  It is unclear if this is the same residual infection.  He likely is with a false negative in between.  Patient does not smoke, does not drink.  She would like to be DNR per daughter report.  In the ED Temperature 101.3, heart rate 73-88, respiratory rate 13-14, blood pressure 116/45, satting 100% No leukocytosis with a white blood cell count 9.1, hemoglobin  11.0 Chemistry panel reveals a slight hyponatremia at 133, and elevated BUN at 33, elevated creatinine at 3.39 (baseline 0.85), glucose 167 Tylenol given COVID-positive UA positive for UTI, urine culture pending Blood culture pending EKG shows a heart rate of 84, sinus rhythm, QTC 490 Rocephin 1 g, 1 L NS bolus Admission requested for further treatment of generalized weakness and altered mental status.    Review of systems:    Unfortunately patient cannot complete review of systems due to altered mental status   Past History of the following :    Past Medical History:  Diagnosis Date   Arthritis    Cancer ([place])    Cataract    Chest pain    Associated with weakness and fatigue   Diabetes mellitus    A1c of 7.4 in 08/2010   GERD (gastroesophageal reflux disease)    Glaucoma    Hyperlipidemia    Lipid profile in 08/2010:190, 121, 48, 118; normal CBC and CMet   Hypertension    Lipid profile in 08/2010:190, 121, 48, 118; normal CBC and CMet   Hypothyroidism     Hypothyroidism    Obesity       Past Surgical History:  Procedure Laterality Date   CHOLECYSTECTOMY     COLONOSCOPY N/A 04/01/2014   RMR: Melanosis coli. colonic polyps removed as described above.    CYSTOSCOPY W/ URETERAL STENT PLACEMENT Right 09/13/2019   Procedure: CYSTOSCOPY WITH RIGHT  RETROGRADE PYELOGRAM; BLADDER BIOPSY;  Surgeon: Cleon Gustin, MD;  Location: [facility];  Service: Urology;  Laterality: Right;   LEFT HEART CATH AND CORONARY ANGIOGRAPHY N/A 10/07/2017   Procedure: LEFT HEART CATH AND CORONARY ANGIOGRAPHY;  Surgeon: Troy Sine, MD;  Location: [facility];  Service: Cardiovascular;  Laterality: N/A;   THYROIDECTOMY, PARTIAL        Social History:      Social History   Tobacco Use   Smoking status: Never    Passive exposure: Yes   Smokeless tobacco: Never  Substance Use Topics   Alcohol use: Never       Family History :     Family History  Problem Relation Age of Onset   Anemia Father    Arthritis Mother    Stroke Brother    Cancer Sister        unknown kind   Diabetes Sister    Dementia Sister    Cancer Niece        Breast   Cancer Brother    Healthy Son    Healthy Son    Healthy Son    Healthy Daughter    Healthy Daughter    Healthy Daughter    Sarcoidosis Daughter         Home Medications:   Prior to Admission medications   Medication Sig Start Date End Date Taking? Authorizing Provider  acyclovir (ZOVIRAX) 400 MG tablet Take 1 tablet (400 mg total) by mouth 2 (two) times daily. 10/26/19  Yes Derek Jack, MD  amLODipine (NORVASC) 10 MG tablet Take 1 tablet (10 mg total) by mouth daily. 04/20/19  Yes [place], Modena Nunnery, MD  ASPIRIN 81 PO Take 1 tablet by mouth daily.   Yes [provider]  Blood Glucose Monitoring Suppl (BLOOD GLUCOSE SYSTEM PAK) KIT Please dispense based on patient and insurance preference. Use as directed to monitor FSBS 2x daily. Dx: E11.9. 04/12/18  Yes [place], Modena Nunnery, MD  Calcium  500 MG tablet Take 600 mg by mouth 2 (  two) times daily.   Yes [provider]  gabapentin (NEURONTIN) 300 MG capsule TAKE ONE CAPSULE BY MOUTH EVERY NIGHT AT BEDTIME 05/06/20  Yes Patel, Colin Broach, MD  glipiZIDE (GLUCOTROL) 5 MG tablet TAKE ONE TABLET (5MG TOTAL) BY MOUTH TWOTIMES DAILY Patient taking differently: Take 5 mg by mouth 2 (two) times daily before a meal. 03/10/20  Yes [place], Modena Nunnery, MD  Glucose Blood (BLOOD GLUCOSE TEST STRIPS) STRP Please dispense based on patient and insurance preference. Use as directed to monitor FSBS 2x daily. Dx: E11.9. 04/12/18  Yes [place], Modena Nunnery, MD  HM LORATADINE 10 MG tablet TAKE ONE TABLET (10MG TOTAL) BY MOUTH DAILY Patient taking differently: Take 10 mg by mouth daily as needed for allergies. 10/05/19  Yes [place], Modena Nunnery, MD  HYDROcodone-acetaminophen (NORCO/VICODIN) 5-325 MG tablet Take 1 tablet by mouth every 6 (six) hours as needed for moderate pain or severe pain. 09/24/20  Yes [place] Spar, MD  Lancets MISC Please dispense based on patient and insurance preference. Use as directed to monitor FSBS 2x daily. Dx: E11.9. 04/12/18  Yes [place], Modena Nunnery, MD  lenalidomide (REVLIMID) 10 MG capsule Take 1 capsule (10 mg total) by mouth daily. 14 days on, 7 days off 09/16/20  Yes Derek Jack, MD  losartan-hydrochlorothiazide [facility]) 50-12.5 MG tablet TAKE ONE (1) TABLET BY MOUTH EVERY DAY 09/24/20  Yes [place]ell Spar, MD  magic mouthwash w/lidocaine SOLN Take 5 mLs by mouth 4 (four) times daily. 01/08/20  Yes Derek Jack, MD  magnesium oxide (MAG-OX) 400 (240 Mg) MG tablet TAKE ONE TABLET (400MG TOTAL) BY MOUTH TWO TIMES DAILY Patient taking differently: Take 400 mg by mouth 2 (two) times daily. 08/07/20  Yes Pen[place], Rebekah M, PA-C  meclizine (ANTIVERT) 25 MG tablet Take 25 mg by mouth every 6 (six) hours as needed for dizziness.    Yes [provider]  metFORMIN (GLUCOPHAGE) 1000 MG tablet TAKE ONE TABLET BY MOUTH  TWICE A DAY 08/07/20  Yes [place] Spar, MD  metoprolol tartrate (LOPRESSOR) 25 MG tablet TAKE ONE-HALF TABLET (12.5MG TOTAL) BY MOUTH TWO TIMES DAILY Patient taking differently: Take 12.5 mg by mouth 2 (two) times daily. 06/19/20  Yes [place] Spar, MD  omeprazole (PRILOSEC) 40 MG capsule TAKE ONE CAPSULE BY MOUTH DAILY 04/03/20  Yes [place], [place]ena Nunnery, MD  potassium chloride SA (KLOR-CON) 20 MEQ tablet Take 1 tablet (20 mEq total) by mouth 2 (two) times daily. 12/28/19  Yes Jacquelin Hawking, NP  prochlorperazine (COMPAZINE) 10 MG tablet Take 1 tablet (10 mg total) by mouth every 6 (six) hours as needed (Nausea or vomiting). 10/26/19  Yes Derek Jack, MD  simvastatin (ZOCOR) 40 MG tablet TAKE ONE TABLET (40MG TOTAL) BY MOUTH BEDTIME Patient taking differently: Take 40 mg by mouth at bedtime. 03/10/20  Yes [place], Modena Nunnery, MD  sulfamethoxazole-trimethoprim (BACTRIM DS) 800-160 MG tablet Take 1 tablet by mouth 2 (two) times daily. 09/24/20  Yes Patel, Colin Broach, MD  SYNTHROID 137 MCG tablet TAKE ONE TABLET (137MCG TOTAL) BY MOUTH DAILY BEFORE BREAKFAST Patient taking differently: Take 137 mcg by mouth daily before breakfast. 09/24/20  Yes Patel, Colin Broach, MD  traMADol (ULTRAM) 50 MG tablet TAKE ONE TABLET (50MG TOTAL) BY MOUTH TWO TIMES DAILY AS NEEDED Patient taking differently: Take 50 mg by mouth 2 (two) times daily as needed for moderate pain. 06/02/20  Yes Derek Jack, MD  fluticasone (FLONASE) 50 MCG/ACT nasal spray USE TWO SPRAYS IN BOTH NOSTRILS DAILY  Patient taking differently: Place 2 sprays into both nostrils daily. 09/11/20   [place] Spar, MD  tolterodine (DETROL LA) 4 MG 24 hr capsule TAKE ONE CAPSULE BY MOUTH DAILY Patient not taking: No sig reported 04/24/19   Alycia Rossetti, MD     Allergies:    No Known Allergies   Physical Exam:   Vitals  Blood pressure (!) 128/51, pulse 79, temperature 100.2 F (37.9 C), temperature source Oral, resp. rate 16, SpO2 100  %.  1.  General: Patient lying supine in bed,  no acute distress   2. Psychiatric: Somnolent and oriented  to self, mood and behavior normal for situation, pleasant and cooperative with exam   3. Neurologic: Speech and language are normal, face is symmetric, moves all 4 extremities voluntarily, at baseline without acute deficits on limited exam   4. HEENMT:  Head is atraumatic, normocephalic, pupils reactive to light, neck is supple, trachea is midline, mucous membranes are moist   5. Respiratory : Lungs are clear to auscultation bilaterally without wheezing, rhonchi, rales, no cyanosis, no increase in work of breathing or accessory muscle use   6. Cardiovascular : Heart rate normal, rhythm is regular, no murmurs, rubs or gallops, peripheral edema present, peripheral pulses palpated   7. Gastrointestinal:  Abdomen is soft, nondistended, nontender to palpation bowel sounds active, no masses or organomegaly palpated   8. Skin:  Skin is warm, dry and intact without rashes, acute lesions, or ulcers on limited exam   9.Musculoskeletal:  No acute deformities or trauma, no asymmetry in tone, peripheral edema present, peripheral pulses palpated, no tenderness to palpation in the extremities     Data Review:    CBC Recent Labs  Lab 09/25/20 1821  WBC 9.1  HGB 11.0*  HCT 36.6  PLT 161  MCV 92.4  MCH 27.8  MCHC 30.1  RDW 18.0*  LYMPHSABS 2.6  MONOABS 1.1*  EOSABS 0.2  BASOSABS 0.1   ------------------------------------------------------------------------------------------------------------------  Results for orders placed or performed during the hospital encounter of 09/25/20 (from the past 48 hour(s))  Resp Panel by RT-PCR (Flu A&B, Covid) Nasopharyngeal Swab     Status: Abnormal   Collection Time: 09/25/20  6:21 PM   Specimen: Nasopharyngeal Swab; Nasopharyngeal(NP) swabs in vial transport medium  Result Value Ref Range   SARS Coronavirus 2 by RT PCR POSITIVE (A)  NEGATIVE    Comment: RESULT CALLED TO, READ BACK BY AND VERIFIED WITH: GESELL,K ON 09/25/20 AT 2235 BY LOY,C (NOTE) SARS-CoV-2 target nucleic acids are DETECTED.  The SARS-CoV-2 RNA is generally detectable in upper respiratory specimens during the acute phase of infection. Positive results are indicative of the presence of the identified virus, but do not rule out bacterial infection or co-infection with other pathogens not detected by the test. Clinical correlation with patient history and other diagnostic information is necessary to determine patient infection status. The expected result is Negative.  Fact Sheet for Patients: EntrepreneurPulse.com.au  Fact Sheet for Healthcare Providers: IncredibleEmployment.be  This test is not yet approved or cleared by the [place] FDA and  has been authorized for detection and/or diagnosis of SARS-CoV-2 by FDA under an Emergency Use Authorization (EUA).  This EUA will remain in effect (meaning this test can  be used) for the duration of  the COVID-19 declaration under Section 564(b)(1) of the Act, 21 U.S.C. section 360bbb-3(b)(1), unless the authorization is terminated or revoked sooner.     Influenza A by PCR NEGATIVE NEGATIVE   Influenza B  by PCR NEGATIVE NEGATIVE    Comment: (NOTE) The Xpert Xpress SARS-CoV-2/FLU/RSV plus assay is intended as an aid in the diagnosis of influenza from Nasopharyngeal swab specimens and should not be used as a sole basis for treatment. Nasal washings and aspirates are unacceptable for Xpert Xpress SARS-CoV-2/FLU/RSV testing.  Fact Sheet for Patients: EntrepreneurPulse.com.au  Fact Sheet for Healthcare Providers: IncredibleEmployment.be  This test is not yet approved or cleared by the [place] FDA and has been authorized for detection and/or diagnosis of SARS-CoV-2 by FDA under an Emergency Use Authorization (EUA). This  EUA will remain in effect (meaning this test can be used) for the duration of the COVID-19 declaration under Section 564(b)(1) of the Act, 21 U.S.C. section 360bbb-3(b)(1), unless the authorization is terminated or revoked.  Performed at [facility], [address] [place], [place] [address]   CBC with Differential/Platelet     Status: Abnormal   Collection Time: 09/25/20  6:21 PM  Result Value Ref Range   WBC 9.1 4.0 - 10.5 K/uL   RBC 3.96 3.87 - 5.11 MIL/uL   Hemoglobin 11.0 (L) 12.0 - 15.0 g/dL   HCT 36.6 36.0 - 46.0 %   MCV 92.4 80.0 - 100.0 fL   MCH 27.8 26.0 - 34.0 pg   MCHC 30.1 30.0 - 36.0 g/dL   RDW 18.0 (H) 11.5 - 15.5 %   Platelets 161 150 - 400 K/uL   nRBC 0.0 0.0 - 0.2 %   Neutrophils Relative % 56 %   Neutro Abs 5.1 1.7 - 7.7 K/uL   Lymphocytes Relative 28 %   Lymphs Abs 2.6 0.7 - 4.0 K/uL   Monocytes Relative 12 %   Monocytes Absolute 1.1 (H) 0.1 - 1.0 K/uL   Eosinophils Relative 2 %   Eosinophils Absolute 0.2 0.0 - 0.5 K/uL   Basophils Relative 1 %   Basophils Absolute 0.1 0.0 - 0.1 K/uL   Immature Granulocytes 1 %   Abs Immature Granulocytes 0.08 (H) 0.00 - 0.07 K/uL    Comment: Performed at [facility], [address] [place], [place] [address]  Comprehensive metabolic panel     Status: Abnormal   Collection Time: 09/25/20  6:21 PM  Result Value Ref Range   Sodium 133 (L) 135 - 145 mmol/L   Potassium 3.6 3.5 - 5.1 mmol/L   Chloride 99 98 - 111 mmol/L   CO2 23 22 - 32 mmol/L   Glucose, Bld 167 (H) 70 - 99 mg/dL    Comment: Glucose reference range applies only to samples taken after fasting for at least 8 hours.   BUN 33 (H) 8 - 23 mg/dL   Creatinine, Ser 3.39 (H) 0.44 - 1.00 mg/dL   Calcium 6.5 (L) 8.9 - 10.3 mg/dL   Total Protein 7.1 6.5 - 8.1 g/dL   Albumin 3.4 (L) 3.5 - 5.0 g/dL   AST 37 15 - 41 U/L   ALT 17 0 - 44 U/L   Alkaline Phosphatase 71 38 - 126 U/L   Total Bilirubin 1.1 0.3 - 1.2 mg/dL   GFR, Estimated 13 (L) >60 mL/min    Comment:  (NOTE) Calculated using the CKD-EPI Creatinine Equation (2021)    Anion gap 11 5 - 15    Comment: Performed at [facility], [address], [place], [place] [address]  Lactic acid, plasma     Status: None   Collection Time: 09/25/20  6:21 PM  Result Value Ref Range   Lactic Acid, Venous 1.9 0.5 -  1.9 mmol/L    Comment: Performed at [facility], [address] [place], [place] [address]  Urinalysis, Routine w reflex microscopic Urine, Catheterized     Status: Abnormal   Collection Time: 09/25/20  7:36 PM  Result Value Ref Range   Color, Urine YELLOW YELLOW   APPearance TURBID (A) CLEAR   Specific Gravity, Urine 1.014 1.005 - 1.030   pH 7.0 5.0 - 8.0   Glucose, UA NEGATIVE NEGATIVE mg/dL   Hgb urine dipstick SMALL (A) NEGATIVE   Bilirubin Urine NEGATIVE NEGATIVE   Ketones, ur 5 (A) NEGATIVE mg/dL   Protein, ur 100 (A) NEGATIVE mg/dL   Nitrite NEGATIVE NEGATIVE   Leukocytes,Ua LARGE (A) NEGATIVE   RBC / HPF 0-5 0 - 5 RBC/hpf   WBC, UA >50 (H) 0 - 5 WBC/hpf   Bacteria, UA MANY (A) NONE SEEN   Squamous Epithelial / LPF 0-5 0 - 5   WBC Clumps PRESENT     Comment: Performed at [facility], [address] [place], [place] [address]    Chemistries  Recent Labs  Lab 09/24/20 1517 09/25/20 1821  NA 137 133*  K 3.7 3.6  CL 99 99  CO2 22 23  GLUCOSE 195* 167*  BUN 21 33*  CREATININE 1.37* 3.39*  CALCIUM 7.6* 6.5*  MG 1.6  --   AST  --  37  ALT  --  17  ALKPHOS  --  71  BILITOT  --  1.1   ------------------------------------------------------------------------------------------------------------------  ------------------------------------------------------------------------------------------------------------------ GFR: Estimated Creatinine Clearance: 13.6 mL/min (A) (by C-G formula based on SCr of 3.39 mg/dL (H)). Liver Function Tests: Recent Labs  Lab 09/25/20 1821  AST 37  ALT 17  ALKPHOS 71  BILITOT 1.1  PROT 7.1  ALBUMIN 3.4*   No results for input(s):  LIPASE, AMYLASE in the last 168 hours. No results for input(s): AMMONIA in the last 168 hours. Coagulation Profile: No results for input(s): INR, PROTIME in the last 168 hours. Cardiac Enzymes: No results for input(s): CKTOTAL, CKMB, CKMBINDEX, TROPONINI in the last 168 hours. BNP (last 3 results) No results for input(s): PROBNP in the last 8760 hours. HbA1C: Recent Labs    09/24/20 1517  HGBA1C 6.7*   CBG: No results for input(s): GLUCAP in the last 168 hours. Lipid Profile: No results for input(s): CHOL, HDL, LDLCALC, TRIG, CHOLHDL, LDLDIRECT in the last 72 hours. Thyroid Function Tests: No results for input(s): TSH, T4TOTAL, FREET4, T3FREE, THYROIDAB in the last 72 hours. Anemia Panel: No results for input(s): VITAMINB12, FOLATE, FERRITIN, TIBC, IRON, RETICCTPCT in the last 72 hours.  --------------------------------------------------------------------------------------------------------------- Urine analysis:    Component Value Date/Time   COLORURINE YELLOW 09/25/2020 1936   APPEARANCEUR TURBID (A) 09/25/2020 1936   LABSPEC 1.014 09/25/2020 1936   PHURINE 7.0 09/25/2020 1936   GLUCOSEU NEGATIVE 09/25/2020 1936   HGBUR SMALL (A) 09/25/2020 1936   BILIRUBINUR NEGATIVE 09/25/2020 1936   BILIRUBINUR neg 08/27/2019 1455   KETONESUR 5 (A) 09/25/2020 1936   PROTEINUR 100 (A) 09/25/2020 1936   UROBILINOGEN 2.0 (A) 08/27/2019 1455   NITRITE NEGATIVE 09/25/2020 1936   LEUKOCYTESUR LARGE (A) 09/25/2020 1936      Imaging Results:    DG CHEST PORT 1 VIEW  Result Date: 09/26/2020 CLINICAL DATA:  Positive COVID-19.  Weakness and fever. EXAM: PORTABLE CHEST 1 VIEW COMPARISON:  Chest radiograph dated 03/26/2020. FINDINGS: Background of emphysema. No focal consolidation, pleural effusion, or pneumothorax. Stable cardiac silhouette. Atherosclerotic calcification of the aorta. Osteopenia with degenerative changes  of the spine. No acute osseous pathology. IMPRESSION: 1. No acute  cardiopulmonary process. 2. Emphysema. Electronically Signed   By: Anner Crete M.D.   On: 09/26/2020 00:40       Assessment & Plan:    Active Problems:   Type 2 diabetes mellitus (HCC)   Hypothyroidism   Hypertension   Hyperlipidemia   Generalized weakness   AKI (acute kidney injury) ([place])   AKI Baseline creatinine 0.5 Today creatinine 3.39 2 days ago creatinine 1.37 Secondary to dehydration which is secondary to poor p.o. intake 1 L bolus given in ED, continue fluids Encourage p.o. intake Trend in the a.m. COVID-positive No respiratory symptoms Seems to be residual positive result UTI Dysuria, fever and suspicious UA Continue Rocephin Urine culture blood culture pending Acute metabolic encephalopathy Secondary to AKI, dehydration, UTI Continue treatments as above and continue to monitor Generalized weakness PT eval and treat Diabetes mellitus type 2 Sliding scale coverage Hypothyroidism With the altered mental status and generalized weakness we will check it TSH Continue Synthroid Protein calorie malnutrition Secondary to poor p.o. intake Encourage p.o. intake Hypocalcemia 6.4, chemistry Corrects to 6.9 1 g calcium gluconate Trend in the a.m. Torticollis 1 dose Valium Monitor for progress   DVT Prophylaxis-   Heparin- SCDs   AM Labs Ordered, also please review Full Orders  Family Communication: Admission, patients condition and plan of care including tests being ordered have been discussed with the patient and daughter who indicate understanding and agree with the plan and Code Status.  Code Status: DNR  Admission status: Inpatient :The appropriate admission status for this patient is INPATIENT. Inpatient status is judged to be reasonable and necessary in order to provide the required intensity of service to ensure the patient's safety. The patient's presenting symptoms, physical exam findings, and initial radiographic and laboratory data in the  context of their chronic comorbidities is felt to place them at high risk for further clinical deterioration. Furthermore, it is not anticipated that the patient will be medically stable for discharge from the hospital within 2 midnights of admission. The following factors support the admission status of inpatient.     The patient's presenting symptoms include altered mental status and generalized weakness. The worrisome physical exam findings include encephalopathy. The initial radiographic and laboratory data are worrisome because of AKI. The chronic co-morbidities include diabetes mellitus, hypertension, hyperlipidemia, hypothyroidism.       * I certify that at the point of admission it is my clinical judgment that the patient will require inpatient hospital care spanning beyond 2 midnights from the point of admission due to high intensity of service, high risk for further deterioration and high frequency of surveillance required.*  Time spent in minutes : [place]

## 2020-09-26 NOTE — TOC Initial Note (Signed)
Transition of Care [facility]) - Initial/Assessment Note    Patient Details  Name: Kim Mills MRN: [id number] Date of Birth: 1940-08-24  Transition of Care [facility]) CM/SW Contact:    Ihor Gully, LCSW Phone Number: 09/26/2020, 3:08 PM  Clinical Narrative:                 Patient from home with daughter, Iris. Admitted with AKI. At baseline uses wheelchair and can occasionally ambulate a very short distance with her walker. Has BSC, scooter. Vaccinated for [place]. Tested COVID+ on home test on 08/26/20 or 08/27/20, was tested again on home test ten days later and was negative. Tested positive again at [facility] today. Discussed SNF recommendation. Not interested in SNF, wants HH with RN, PT, aide.  Has caregivers to come during the day to assist her while daughter is working.  Expected Discharge Plan: [place] Barriers to Discharge: Continued Medical Work up   Patient Goals and CMS Choice Patient states their goals for this hospitalization and ongoing recovery are:: home with [facility]      Expected Discharge Plan and Services Expected Discharge Plan: [place] Acute Care Choice: [place] arrangements for the past 2 months: Single Family Home                                      Prior Living Arrangements/Services Living arrangements for the past 2 months: Single Family Home Lives with:: Adult Children Patient language and need for interpreter reviewed:: Yes Do you feel safe going back to the place where you live?: Yes      Need for Family Participation in Patient Care: Yes (Comment) Care giver support system in place?: Yes (comment) Current home services: DME Criminal Activity/Legal Involvement Pertinent to Current Situation/Hospitalization: No - Comment as needed  Activities of Daily Living      Permission Sought/Granted Permission sought to share information with : Family Supports    Share Information with NAME:  daughter, Iris           Emotional Assessment     Affect (typically observed): Appropriate Orientation: : Oriented to Self Alcohol / Substance Use: Not Applicable Psych Involvement: No (comment)  Admission diagnosis:  AKI (acute kidney injury) ([place]) [N17.9] Patient Active Problem List   Diagnosis Date Noted   AKI (acute kidney injury) ([place]) 09/26/2020   Physical debility 09/26/2020    Class: Chronic   Acute lower UTI 09/26/2020    Class: Acute   Generalized weakness 09/24/2020   Overactive bladder 05/13/2020   Multiple myeloma ([place]) 10/13/2019   Goals of care, counseling/discussion 10/13/2019   Multiple myeloma not having achieved remission ([place]) 10/13/2019   Monoclonal gammopathy 09/12/2019   CKD (chronic kidney disease) stage 3, GFR 30-59 ml/min ([place]) 04/18/2019   Vitamin D deficiency 04/17/2019   B12 deficiency 04/12/2018   Normocytic anemia 04/12/2018   Carotid artery disease without cerebral infarction ([place]) 04/12/2018   Gait disorder 01/11/2018   Diabetic neuropathy ([place]) 01/10/2018   CAD (coronary artery disease) 11/23/2017   SVT (supraventricular tachycardia) ([place]) 10/05/2017   History of colonic polyps    GERD (gastroesophageal reflux disease) 03/13/2014   Hyperlipidemia    Type 2 diabetes mellitus (HCC)    OA (osteoarthritis) of knee    Hypothyroidism    Hypertension    PCP:  Lindell Spar, MD Pharmacy:  [facility] [place] [address] Phone: [phone] Fax: [phone]  [place], [place] - [address] [place] [address] Phone: [phone] Fax: [phone]     Social Determinants of Health (SDOH) Interventions    Readmission Risk Interventions No flowsheet data found.

## 2020-09-26 NOTE — Progress Notes (Signed)
PT IS REQUESTING SOMETHING TO ASSIST IN HER ANXIETY AND TO HELP GET SOME SLEEP. HER DAUGHTER IRIS IS AT THE BED SIDE AND AIDING IN REDIRECTING AND REASSURING HER MOTHER. MESSAGE SENT TO ON CALL. AWAITING ORDERS.

## 2020-09-26 NOTE — Evaluation (Signed)
Physical Therapy Evaluation Patient Details Name: Kim Mills MRN: [id number] DOB: [birth year] Today's Date: 09/26/2020   History of Present Illness  Kim Mills  is a 80 y.o. female, with history of multiple myeloma, diabetes mellitus type 2, GERD, hyperlipidemia, hypertension, hypothyroidism presents the ED with a chief complaint of generalized weakness and altered mental status.  Patient is not able to provide any history.  Daughter reports that on the 12th patient started feeling very exhausted.  This went on for a few days without progressing.  Then on the 15th patient was more fatigued, and complaining of right-sided neck stiffness that radiated down to her shoulder and then all the way down to her legs.  She reports that mother could not turn her head.  Symptoms continued to progress and patient became weak, unable to assist with ambulation.  At baseline patient is able to walk with her walker about 10 feet.  She uses her wheelchair most of the time.  She is usually able to help move herself in the bed, and up to the toilet.  Daughter reports that they made an appointment to go see his PCP.  Patient was complaining of dysuria at that time.  They were not able to get a urine sample, but had plan to treat empirically for UTI.  Prescription was written for Bactrim, but was not filled.  Blood work was done, and patient was sent to the ED today for abnormal labs.  Daughter reports that today patient had more of the same symptoms, but progressively worse.  She was not able to stand up at all.  With assistance she still was eased down to the ground-without an actual fall demonstrated she was not strong enough to stand up.  They had to get a lift to get her back into the bed.  Daughter reports the patient was not eating today.  She was sleeping all day.  Her p.o. intake has been decreased for couple months, and much worse over the last week.  Daughter reports that patient did not urinate all day today.   She was speaking incoherently.  When they got the call from the doctor's office they came straight in.   Clinical Impression  Patient demonstrates slow labored movement for sitting up at bedside with limited use of RUE due to weakness/pain, at high risk for falls and unable to maintain standing balance using RW due to BLE weakness.  Patient required Mod/max assistance to reposition when put back to bed.  Patient will benefit from continued physical therapy in hospital and recommended venue below to increase strength, balance, endurance for safe ADLs and gait.      Follow Up Recommendations SNF;Supervision for mobility/OOB;Supervision/Assistance - 24 hour    Equipment Recommendations  None recommended by PT    Recommendations for Other Services       Precautions / Restrictions Precautions Precautions: Fall Restrictions Weight Bearing Restrictions: No      Mobility  Bed Mobility Overal bed mobility: Needs Assistance Bed Mobility: Supine to Sit;Sit to Supine     Supine to sit: Mod assist;Max assist Sit to supine: Mod assist;Max assist   General bed mobility comments: slow labored movement with limited use of RUE due to pain    Transfers Overall transfer level: Needs assistance Equipment used: Rolling walker (2 wheeled) Transfers: Sit to/from Stand Sit to Stand: Max assist         General transfer comment: unable to maintain standing balance due to BLE weakness  Ambulation/Gait  Stairs            Wheelchair Mobility    Modified Rankin (Stroke Patients Only)       Balance Overall balance assessment: Needs assistance Sitting-balance support: Feet supported;Bilateral upper extremity supported Sitting balance-Leahy Scale: Poor Sitting balance - Comments: fair/poor seated at EOB   Standing balance support: During functional activity;Bilateral upper extremity supported Standing balance-Leahy Scale: Poor Standing balance comment: using  RW                             Pertinent Vitals/Pain Pain Assessment: Faces Faces Pain Scale: Hurts little more Pain Location: RUE Pain Descriptors / Indicators: Grimacing;Guarding;Sore    Home Living Family/patient expects to be discharged to:: Private residence Living Arrangements: Children Available Help at Discharge: Family;Available 24 hours/day Type of Home: House Home Access: Stairs to enter Entrance Stairs-Rails: None Entrance Stairs-Number of Steps: 1 Home Layout: One level;Laundry or work area in basement (Patient does not go into basement) Home Equipment: Environmental consultant - 4 wheels;Bedside commode;Shower seat;Electric scooter;Wheelchair - manual      Prior Function Level of Independence: Needs assistance   Gait / Transfers Assistance Needed: Min guard assisted short household distances for ambulation, assisted transfers, uses wheelchair mostly  ADL's / Homemaking Assistance Needed: assisted by family        Hand Dominance        Extremity/Trunk Assessment   Upper Extremity Assessment Upper Extremity Assessment: Generalized weakness    Lower Extremity Assessment Lower Extremity Assessment: Generalized weakness    Cervical / Trunk Assessment Cervical / Trunk Assessment: Kyphotic  Communication   Communication: No difficulties  Cognition Arousal/Alertness: Awake/alert Behavior During Therapy: WFL for tasks assessed/performed Overall Cognitive Status: Within Functional Limits for tasks assessed                                        General Comments      Exercises     Assessment/Plan    PT Assessment Patient needs continued PT services  PT Problem List Decreased strength;Decreased activity tolerance;Decreased balance;Decreased mobility       PT Treatment Interventions Gait training;DME instruction;Stair training;Functional mobility training;Therapeutic activities;Therapeutic exercise;Balance training;Patient/family education     PT Goals (Current goals can be found in the Care Plan section)  Acute Rehab PT Goals Patient Stated Goal: return home with family to assist PT Goal Formulation: With patient/family Time For Goal Achievement: 10/10/20 Potential to Achieve Goals: Good    Frequency Min 3X/week   Barriers to discharge        Co-evaluation               AM-PAC PT "6 Clicks" Mobility  Outcome Measure Help needed turning from your back to your side while in a flat bed without using bedrails?: A Lot Help needed moving from lying on your back to sitting on the side of a flat bed without using bedrails?: A Lot Help needed moving to and from a bed to a chair (including a wheelchair)?: A Lot Help needed standing up from a chair using your arms (e.g., wheelchair or bedside chair)?: A Lot Help needed to walk in hospital room?: Total Help needed climbing 3-5 steps with a railing? : Total 6 Click Score: 10    End of Session   Activity Tolerance: Patient tolerated treatment well;Patient limited by fatigue;Patient limited by pain Patient  left: in bed;with call bell/phone within reach;with family/visitor present Nurse Communication: Mobility status PT Visit Diagnosis: Unsteadiness on feet (R26.81);Other abnormalities of gait and mobility (R26.89);Muscle weakness (generalized) (M62.81)    Time: 1224-8250 PT Time Calculation (min) (ACUTE ONLY): 30 min   Charges:   PT Evaluation $PT Eval Moderate Complexity: 1 Mod PT Treatments $Therapeutic Activity: 23-37 mins        12:35 PM, 09/26/20 [name], MPT Physical Therapist with [facility] 336 [phone] office [phone] mobile phone

## 2020-09-26 NOTE — Progress Notes (Signed)
PROGRESS NOTE    Patient: Kim Mills                            PCP: Lindell Spar, MD                    DOB: 22-May-1940            DOA: 09/25/2020 WUJ:[id number]             DOS: 09/26/2020, 10:15 AM   LOS: 0 days   Date of Service: The patient was seen and examined on 09/26/2020  Subjective:   The patient was seen and examined this morning. Stable at this time. Still complaining of :  Otherwise no issues overnight .  Brief Narrative:   Kim Mills   is a 80 year old female with PMH of multiple myeloma, HTN, CAD, DM with neuropathy, CKD stage 3, hypothyroidism, overactive bladder and chronic pain who presents ...  c/o neck pain/stiffness for last 5 days Then on the 15th patient was more fatigued, and complaining of right-sided neck stiffness that radiated down At baseline patient is able to use wheelchair, walk with walker approximately 10 feet.  She uses her wheelchair most of the time.  Now she is unable to do any of that-in total assist, needing assist with all ADLs. Also was complaining of dysuria past few days.  PCP started Bactrim she has not filled it yet     Patient is vaccinated for COVID with 2 shots, not boosted yet.  She had a positive COVID test 30 days ago, negative COVID test 10 days ago, and then a positive COVID test on this admission..  It is unclear if this is the same residual infection.  He likely is with a false negative in between.   Patient does not smoke, does not drink.  She would like to be DNR per daughter report.   ED: T-max 101.3, RR 13-14, BP 116/45, HR 73, satting 100% room air, No leukocytosis wi WBC of9.1, hemoglobin 11.0, ,CMP: Na 133, and elevated BUN at 33, elevated creatinine at 3.39 (baseline 0.85), glucose 167 Tylenol given, IV Rocephin 1 g, 1 L NS bolus -given in ED - COVID-positive - UA positive for UTI, urine culture pending-  - Blood culture pending - EKG shows a heart rate of 84, sinus rhythm, QTC 490  Assessment &  Plan:   Active Problems:   Type 2 diabetes mellitus (HCC)   Hypothyroidism   Hypertension   Hyperlipidemia   Generalized weakness   AKI (acute kidney injury) ([place])    AKI Baseline creatinine 0.5 Today creatinine 3.39 >> 2.31 today Baseline creatinine about 1.37 Likely due to poor p.o. intake, dehydration, UTI  1 L bolus given in ED, continue fluids Encourage p.o. intake Will monitor BUN/creatinine closely Will avoid nephrotoxins COVID-positive No respiratory symptoms, on room air satting 100% Vaccinated Seems to be residual positive result No aggressive treatment recommended at this time UTI Dysuria, fever and suspicious UA We will continue IV Rocephin Urine culture blood culture >>>> we will follow-up accordingly  Acute metabolic encephalopathy Secondary to AKI, dehydration, UTI Back to baseline Will monitor closely  Generalized weakness PT eval and treat Per daughter patient does not ambulate at baseline with significant right-sided weakness  Diabetes mellitus type 2 Sliding scale coverage Will check CBG q. ACH S  Hypothyroidism TSH Continue Synthroid Protein calorie malnutrition Secondary to poor p.o. intake Encourage p.o. intake  Hypocalcemia/hypomagnesemia Calcium 6.4, corrected calcium for albumin 6.9 1 g calcium gluconate Magnesium 1.6-2 g IV magnesium will be given Torticollis Monitor for progress As needed Valium, muscle relaxants    ------------------------------------------------------------------------------------------------------------------------------------------------ Cultures; Urine Culture  >>> Blood cultures x2 >>    Antimicrobials: IV Rocephin  Consultants: None   ------------------------------------------------------------------------------------------------------------------------------------------------  DVT prophylaxis:  SCD/Compression stockings and Heparin SQ Code Status:   Code Status: DNR  Family Communication:  Daughter at bedside, updated regarding current findings plan of care The above findings and plan of care has been discussed with patient (and family)  in detail,  they expressed understanding and agreement of above. -Advance care planning has been discussed.   Admission status:   Status is: Inpatient  Remains inpatient appropriate because:IV treatments appropriate due to intensity of illness or inability to take PO and Inpatient level of care appropriate due to severity of illness  Dispo: The patient is from: Home              Anticipated d/c is to: Home with home health in >3 days               Patient currently is not medically stable to d/c.   Difficult to place patient No      Level of care: Telemetry   Procedures:   No admission procedures for hospital encounter.    Antimicrobials:  Anti-infectives (From admission, onward)    Start     Dose/Rate Route Frequency Ordered Stop   09/26/20 2200  cefTRIAXone (ROCEPHIN) 1 g in sodium chloride 0.9 % 100 mL IVPB        1 g 200 mL/hr over 30 Minutes Intravenous Every 24 hours 09/26/20 0243     09/25/20 2300  cefTRIAXone (ROCEPHIN) 1 g in sodium chloride 0.9 % 100 mL IVPB        1 g 200 mL/hr over 30 Minutes Intravenous  Once 09/25/20 2259 09/26/20 0119        Medication:   aspirin EC  81 mg Oral Daily   diazepam  5 mg Oral Once   gabapentin  300 mg Oral QHS   heparin  5,000 Units Subcutaneous Q8H   insulin aspart  0-15 Units Subcutaneous TID WC   insulin aspart  0-5 Units Subcutaneous QHS   levothyroxine  137 mcg Oral QAC breakfast   [START ON 09/27/2020] metoprolol tartrate  12.5 mg Oral BID   simvastatin  40 mg Oral q1800    acetaminophen **OR** acetaminophen, albuterol, ondansetron **OR** ondansetron (ZOFRAN) IV, oxyCODONE   Objective:   Vitals:   09/26/20 0400 09/26/20 0758 09/26/20 0900 09/26/20 0930  BP: (!) 112/45 (!) 96/37 (!) 102/48 (!) 92/39  Pulse: 66 73 (!) 55 71  Resp: 15 14 15 14   Temp:  98.1  F (36.7 C)    TempSrc:  Oral    SpO2: 100% 99% 100% 100%    Intake/Output Summary (Last 24 hours) at 09/26/2020 1015 Last data filed at 09/25/2020 2137 Gross per 24 hour  Intake 1000 ml  Output --  Net 1000 ml   There were no vitals filed for this visit.   Examination:   Physical Exam  Constitution:  Alert, cooperative, no distress,  Appears calm and comfortable  Psychiatric: Normal and stable mood and affect, cognition intact,   HEENT: Normocephalic, PERRL, otherwise with in Normal limits  Chest:Chest symmetric Cardio vascular:  S1/S2, RRR, No murmure, No Rubs or Gallops  pulmonary: Clear to auscultation bilaterally, respirations unlabored, negative wheezes /  crackles Abdomen: Soft, non-tender, non-distended, bowel sounds,no masses, no organomegaly Muscular skeletal: Severe global generalized weaknesses-at baseline patient has not ambulated per daughter wheelchair-bound  At this point Limited exam - in bed, able to move all  extremities in bed, much less so on the right lower extremity n,  Neuro: CNII-XII intact. , normal motor and sensation, reflexes intact  Extremities: +2 Rt >L  pitting edema lower extremities, +2 pulses  Skin: Dry, warm to touch, negative for any Rashes, No open wounds Wounds: per nursing documentation    ------------------------------------------------------------------------------------------------------------------------------------------    LABs:  CBC Latest Ref Rng & Units 09/26/2020 09/25/2020 09/15/2020  WBC 4.0 - 10.5 K/uL 8.2 9.1 7.4  Hemoglobin 12.0 - 15.0 g/dL 9.9(L) 11.0(L) 11.9(L)  Hematocrit 36.0 - 46.0 % 32.1(L) 36.6 38.4  Platelets 150 - 400 K/uL 127(L) 161 345   CMP Latest Ref Rng & Units 09/26/2020 09/25/2020 09/24/2020  Glucose 70 - 99 mg/dL 176(H) 167(H) 195(H)  BUN 8 - 23 mg/dL 35(H) 33(H) 21  Creatinine 0.44 - 1.00 mg/dL 2.31(H) 3.39(H) 1.37(H)  Sodium 135 - 145 mmol/L 135 133(L) 137  Potassium 3.5 - 5.1 mmol/L 3.8 3.6 3.7   Chloride 98 - 111 mmol/L 104 99 99  CO2 22 - 32 mmol/L 23 23 22   Calcium 8.9 - 10.3 mg/dL 6.6(L) 6.5(L) 7.6(L)  Total Protein 6.5 - 8.1 g/dL 6.1(L) 7.1 -  Total Bilirubin 0.3 - 1.2 mg/dL 1.0 1.1 -  Alkaline Phos 38 - 126 U/L 61 71 -  AST 15 - 41 U/L 28 37 -  ALT 0 - 44 U/L 14 17 -       Micro Results Recent Results (from the past 240 hour(s))  Resp Panel by RT-PCR (Flu A&B, Covid) Nasopharyngeal Swab     Status: Abnormal   Collection Time: 09/25/20  6:21 PM   Specimen: Nasopharyngeal Swab; Nasopharyngeal(NP) swabs in vial transport medium  Result Value Ref Range Status   SARS Coronavirus 2 by RT PCR POSITIVE (A) NEGATIVE Final    Comment: RESULT CALLED TO, READ BACK BY AND VERIFIED WITH: GESELL,K ON 09/25/20 AT 2235 BY LOY,C (NOTE) SARS-CoV-2 target nucleic acids are DETECTED.  The SARS-CoV-2 RNA is generally detectable in upper respiratory specimens during the acute phase of infection. Positive results are indicative of the presence of the identified virus, but do not rule out bacterial infection or co-infection with other pathogens not detected by the test. Clinical correlation with patient history and other diagnostic information is necessary to determine patient infection status. The expected result is Negative.  Fact Sheet for Patients: EntrepreneurPulse.com.au  Fact Sheet for Healthcare Providers: IncredibleEmployment.be  This test is not yet approved or cleared by the [place] FDA and  has been authorized for detection and/or diagnosis of SARS-CoV-2 by FDA under an Emergency Use Authorization (EUA).  This EUA will remain in effect (meaning this test can  be used) for the duration of  the COVID-19 declaration under Section 564(b)(1) of the Act, 21 U.S.C. section 360bbb-3(b)(1), unless the authorization is terminated or revoked sooner.     Influenza A by PCR NEGATIVE NEGATIVE Final   Influenza B by PCR NEGATIVE NEGATIVE  Final    Comment: (NOTE) The Xpert Xpress SARS-CoV-2/FLU/RSV plus assay is intended as an aid in the diagnosis of influenza from Nasopharyngeal swab specimens and should not be used as a sole basis for treatment. Nasal washings and aspirates are unacceptable for Xpert Xpress SARS-CoV-2/FLU/RSV testing.  Fact Sheet for Patients: EntrepreneurPulse.com.au  Fact Sheet  for Healthcare Providers: IncredibleEmployment.be  This test is not yet approved or cleared by the [place] and has been authorized for detection and/or diagnosis of SARS-CoV-2 by FDA under an Emergency Use Authorization (EUA). This EUA will remain in effect (meaning this test can be used) for the duration of the COVID-19 declaration under Section 564(b)(1) of the Act, 21 U.S.C. section 360bbb-3(b)(1), unless the authorization is terminated or revoked.  Performed at [facility], [address], [place], [place] [address]   Blood culture (routine x 2)     Status: None (Preliminary result)   Collection Time: 09/25/20  6:21 PM   Specimen: BLOOD  Result Value Ref Range Status   Specimen Description BLOOD LEFT ANTECUBITAL  Final   Special Requests   Final    BOTTLES DRAWN AEROBIC AND ANAEROBIC Blood Culture adequate volume   Culture   Final    NO GROWTH < 12 HOURS Performed at [facility], [address], [place], [place] [address]    Report Status PENDING  Incomplete  Blood culture (routine x 2)     Status: None (Preliminary result)   Collection Time: 09/25/20  8:05 PM   Specimen: BLOOD RIGHT ARM  Result Value Ref Range Status   Specimen Description BLOOD RIGHT ARM  Final   Special Requests   Final    BOTTLES DRAWN AEROBIC AND ANAEROBIC Blood Culture adequate volume   Culture   Final    NO GROWTH < 12 HOURS Performed at [facility], [address] [place], [place] [address]    Report Status PENDING  Incomplete    Radiology Reports DG CHEST PORT 1 VIEW  Result  Date: 09/26/2020 CLINICAL DATA:  Positive COVID-19.  Weakness and fever. EXAM: PORTABLE CHEST 1 VIEW COMPARISON:  Chest radiograph dated 03/26/2020. FINDINGS: Background of emphysema. No focal consolidation, pleural effusion, or pneumothorax. Stable cardiac silhouette. Atherosclerotic calcification of the aorta. Osteopenia with degenerative changes of the spine. No acute osseous pathology. IMPRESSION: 1. No acute cardiopulmonary process. 2. Emphysema. Electronically Signed   By: Anner Crete M.D.   On: 09/26/2020 00:40    SIGNED: Deatra James, MD, FHM. [facility],  Pager (please use [email or web address] to page/text) Please use Epic Secure Chat for non-urgent communication (7AM-7PM)  If 7PM-7AM, please contact night-coverage [email or web address], 09/26/2020, 10:15 AM

## 2020-09-26 NOTE — Plan of Care (Signed)
  Problem: Acute Rehab PT Goals(only PT should resolve) Goal: Pt Will Go Supine/Side To Sit Outcome: Progressing Flowsheets (Taken 09/26/2020 1236) Pt will go Supine/Side to Sit: with minimal assist Goal: Patient Will Transfer Sit To/From Stand Outcome: Progressing Flowsheets (Taken 09/26/2020 1236) Patient will transfer sit to/from stand: with minimal assist Goal: Pt Will Transfer Bed To Chair/Chair To Bed Outcome: Progressing Flowsheets (Taken 09/26/2020 1236) Pt will Transfer Bed to Chair/Chair to Bed: with min assist Goal: Pt Will Ambulate Outcome: Progressing Flowsheets (Taken 09/26/2020 1236) Pt will Ambulate:  15 feet  with moderate assist  with rolling walker   12:37 PM, 09/26/20 [name], MPT Physical Therapist with [facility] [phone] office [phone] mobile phone

## 2020-09-27 DIAGNOSIS — N390 Urinary tract infection, site not specified: Secondary | ICD-10-CM | POA: Diagnosis not present

## 2020-09-27 LAB — COMPREHENSIVE METABOLIC PANEL
ALT: 19 U/L (ref 0–44)
AST: 51 U/L — ABNORMAL HIGH (ref 15–41)
Albumin: 2.5 g/dL — ABNORMAL LOW (ref 3.5–5.0)
Alkaline Phosphatase: 62 U/L (ref 38–126)
Anion gap: 7 (ref 5–15)
BUN: 21 mg/dL (ref 8–23)
CO2: 24 mmol/L (ref 22–32)
Calcium: 6.5 mg/dL — ABNORMAL LOW (ref 8.9–10.3)
Chloride: 107 mmol/L (ref 98–111)
Creatinine, Ser: 1.13 mg/dL — ABNORMAL HIGH (ref 0.44–1.00)
GFR, Estimated: 50 mL/min — ABNORMAL LOW (ref >60)
Glucose, Bld: 139 mg/dL — ABNORMAL HIGH (ref 70–99)
Potassium: 3.4 mmol/L — ABNORMAL LOW (ref 3.5–5.1)
Sodium: 138 mmol/L (ref 135–145)
Total Bilirubin: 0.8 mg/dL (ref 0.3–1.2)
Total Protein: 5.9 g/dL — ABNORMAL LOW (ref 6.5–8.1)

## 2020-09-27 LAB — GLUCOSE, CAPILLARY
Glucose-Capillary: 126 mg/dL — ABNORMAL HIGH (ref 70–99)
Glucose-Capillary: 125 mg/dL — ABNORMAL HIGH (ref 70–99)
Glucose-Capillary: 114 mg/dL — ABNORMAL HIGH (ref 70–99)
Glucose-Capillary: 142 mg/dL — ABNORMAL HIGH (ref 70–99)

## 2020-09-27 MED ORDER — POLYETHYLENE GLYCOL 3350 17 G PO PACK
17.0000 g | PACK | Freq: Every day | ORAL | Status: DC
  Administered 2020-09-27 – 2020-09-29 (×3): 17 g via ORAL
  Filled 2020-09-27 (×3): qty 1

## 2020-09-27 MED ORDER — CYCLOBENZAPRINE HCL 10 MG PO TABS
5.0000 mg | ORAL_TABLET | Freq: Three times a day (TID) | ORAL | Status: DC
  Administered 2020-09-27 – 2020-09-29 (×6): 5 mg via ORAL
  Filled 2020-09-27 (×6): qty 1

## 2020-09-27 MED ORDER — POTASSIUM CHLORIDE CRYS ER 20 MEQ PO TBCR
40.0000 meq | EXTENDED_RELEASE_TABLET | Freq: Once | ORAL | Status: AC
  Administered 2020-09-27: 40 meq via ORAL
  Filled 2020-09-27: qty 2

## 2020-09-27 MED ORDER — CALCIUM GLUCONATE-NACL 1-0.675 GM/50ML-% IV SOLN
1.0000 g | Freq: Once | INTRAVENOUS | Status: AC
  Administered 2020-09-27: 1000 mg via INTRAVENOUS
  Filled 2020-09-27: qty 50

## 2020-09-27 NOTE — Progress Notes (Signed)
On call ordered 6 mg melatonin. Administered at 2307, pt rested well through out the night, after this LPN and nurse tech gave bed bath, and changed. Tolerated well. Pillows tucked under to elevate right hip as requested from pt. Bed and wheels locked placed in lowest position, with call bell within reach.Will continue to monitor.

## 2020-09-27 NOTE — Progress Notes (Signed)
PROGRESS NOTE    Patient: Kim Mills                            PCP: Lindell Spar, MD                    DOB: February 11, 1940            DOA: 09/25/2020 [id number]             DOS: 09/27/2020, 12:25 PM   LOS: 1 day   Date of Service: The patient was seen and examined on 09/27/2020  Subjective:   The patient was seen and examined this morning. Stable at this time. Still complaining discomfort in her neck, denies any shortness of breath or chest pain, Still complaining of severe generalized weaknesses Otherwise no issues overnight .  Daughter present at bedside    Brief Narrative:    Kim Mills   is a 80 year old female with PMH of multiple myeloma, HTN, CAD, DM with neuropathy, CKD stage 3, hypothyroidism, overactive bladder and chronic pain who presents ...  c/o neck pain/stiffness for last 5 days Then on the 15th patient was more fatigued, and complaining of right-sided neck stiffness that radiated down At baseline patient is able to use wheelchair, walk with walker approximately 10 feet.  She uses her wheelchair most of the time.  Now she is unable to do any of that-in total assist, needing assist with all ADLs. Also was complaining of dysuria past few days.  PCP started Bactrim she has not filled it yet       Patient is vaccinated for COVID with 2 shots, not boosted yet.  She had a positive COVID test 30 days ago, negative COVID test 10 days ago, and then a positive COVID test on this admission..  It is unclear if this is the same residual infection.  He likely is with a false negative in between.   Patient does not smoke, does not drink.  She would like to be DNR per daughter report.   ED: T-max 101.3, RR 13-14, BP 116/45, HR 73, satting 100% room air, No leukocytosis wi WBC of9.1, hemoglobin 11.0, ,CMP: Na 133, and elevated BUN at 33, elevated creatinine at 3.39 (baseline 0.85), glucose 167 Tylenol given, IV Rocephin 1 g, 1 L NS bolus -given in ED -  COVID-positive - UA positive for UTI, urine culture pending-  - Blood culture pending - EKG shows a heart rate of 84, sinus rhythm, QTC 490   Assessment & Plan:    Active Problems:   Type 2 diabetes mellitus (HCC)   Hypothyroidism   Hypertension   Hyperlipidemia   Generalized weakness   AKI (acute kidney injury) ([place])       AKI Baseline creatinine 0.5 -1.37 Today creatinine 3.39 >> 2.31  >>> 1.13 today Likely due to poor p.o. intake, dehydration, UTI   1 L bolus given in ED, continue fluids Encourage p.o. intake -encouraging daughter to assist oral intake Monitoring much improved Will avoid nephrotoxins COVID-positive Hemodynamically stable In no respiratory symptoms, on room air satting 98% Vaccinated Seems to be residual positive result No aggressive treatment recommended at this time UTI Much improved dysuria fever, hemodynamically stable   continue IV Rocephin Urine culture blood culture >>>> we will follow-up accordingly   Acute metabolic encephalopathy Secondary to AKI, dehydration, UTI Back to baseline   Generalized weakness PT eval and treat Per daughter patient  does not ambulate at baseline with significant right-sided weakness -Anticipating resuming full home health upon discharge   Diabetes mellitus type 2 Sliding scale coverage Hemoglobin A1c 6.7 Will check CBG q. ACH S   Hypothyroidism TSH 3.4 Continue Synthroid Protein calorie malnutrition Secondary to poor p.o. intake Encourage p.o. intake Hypocalcemia/hypomagnesemia Calcium 6.4, corrected calcium for albumin 6.9 1 g calcium gluconate Magnesium 1.6-2 g IV magnesium will be given Torticollis Monitor for progress As needed Valium, muscle relaxants     ------------------------------------------------------------------------------------------------------------------------------------------------ Cultures; Urine Culture  >>> Blood cultures x2 >>      Antimicrobials: IV Rocephin    Consultants: None     ------------------------------------------------------------------------------------------------------------------------------------------------   DVT prophylaxis:  SCD/Compression stockings and Heparin SQ Code Status:   Code Status: DNR   Family Communication: Daughter at bedside, updated regarding current findings plan of care 09/27/2020 The above findings and plan of care has been discussed with patient (and family)  in detail,  they expressed understanding and agreement of above. -Advance care planning has been discussed.    Admission status:   Status is: Inpatient   Remains inpatient appropriate because:IV treatments appropriate due to intensity of illness or inability to take PO and Inpatient level of care appropriate due to severity of illness   Dispo: The patient is from: Home              Anticipated d/c is to: Home with home health in >3 days               Patient currently is not medically stable to d/c.              Difficult to place patient No       Level of care: Telemetry   Procedures:   No admission procedures for hospital encounter.    Antimicrobials:  Anti-infectives (From admission, onward)    Start     Dose/Rate Route Frequency Ordered Stop   09/26/20 2200  cefTRIAXone (ROCEPHIN) 1 g in sodium chloride 0.9 % 100 mL IVPB        1 g 200 mL/hr over 30 Minutes Intravenous Every 24 hours 09/26/20 0243     09/25/20 2300  cefTRIAXone (ROCEPHIN) 1 g in sodium chloride 0.9 % 100 mL IVPB        1 g 200 mL/hr over 30 Minutes Intravenous  Once 09/25/20 2259 09/26/20 0119        Medication:   aspirin EC  81 mg Oral Daily   diazepam  2 mg Oral Q8H   diazepam  5 mg Oral Once   gabapentin  300 mg Oral QHS   heparin  5,000 Units Subcutaneous Q8H   insulin aspart  0-15 Units Subcutaneous TID WC   insulin aspart  0-5 Units Subcutaneous QHS   levothyroxine  137 mcg Oral QAC breakfast   melatonin  6 mg Oral QHS   metoprolol tartrate   12.5 mg Oral BID   simvastatin  40 mg Oral q1800    acetaminophen **OR** acetaminophen, albuterol, cyclobenzaprine, ondansetron **OR** ondansetron (ZOFRAN) IV, oxyCODONE   Objective:   Vitals:   09/26/20 2215 09/26/20 2340 09/27/20 0455 09/27/20 0500  BP: (!) 140/47 (!) 136/55 (!) 131/57   Pulse: 83 76 82   Resp: 20 19 19    Temp: 97.8 F (36.6 C) 97.9 F (36.6 C) 98.2 F (36.8 C)   TempSrc: Oral Oral    SpO2: 90% 92% 98%   Weight:    73.8 kg  Height:  Intake/Output Summary (Last 24 hours) at 09/27/2020 1225 Last data filed at 09/27/2020 0500 Gross per 24 hour  Intake 2710.17 ml  Output 1000 ml  Net 1710.17 ml   Filed Weights   09/26/20 1806 09/27/20 0500  Weight: 73 kg 73.8 kg     Examination:   Physical Exam  Constitution:  Alert, cooperative, no distress,  Appears calm and comfortable   HEENT: Neck pain with some stiffness able to move the right ... Negative for any pain or nuchal rigidity normocephalic, PERRL, otherwise with in Normal limits  Chest:Chest symmetric Cardio vascular:  S1/S2, RRR, No murmure, No Rubs or Gallops  pulmonary: Clear to auscultation bilaterally, respirations unlabored, negative wheezes / crackles Abdomen: Soft, non-tender, non-distended, bowel sounds,no masses, no organomegaly Muscular skeletal: Limited exam -wheelchair-bound at baseline, currently bedbound  Due to multiple sclerosis multiple joint pain, unable to have a full range of motion on right upper arm, right knee..  Able to move left upper and lower extremity in bed Lower extremity  edema bilaterally right > left neuro: CNII-XII intact. ,  Diminished motor in lower extremities and limited range of motion right upper extremity, reduced sensation lower extremities bilaterally symmetric Extremities: BL +2 pitting edema lower extremities, +2 pulses  Skin: Dry, warm to touch, negative for any Rashes, No open wounds Wounds: per nursing documentation     ------------------------------------------------------------------------------------------------------------------------------------------    LABs:  CBC Latest Ref Rng & Units 09/26/2020 09/25/2020 09/15/2020  WBC 4.0 - 10.5 K/uL 8.2 9.1 7.4  Hemoglobin 12.0 - 15.0 g/dL 9.9(L) 11.0(L) 11.9(L)  Hematocrit 36.0 - 46.0 % 32.1(L) 36.6 38.4  Platelets 150 - 400 K/uL 127(L) 161 345   CMP Latest Ref Rng & Units 09/27/2020 09/26/2020 09/25/2020  Glucose 70 - 99 mg/dL 139(H) 176(H) 167(H)  BUN 8 - 23 mg/dL 21 35(H) 33(H)  Creatinine 0.44 - 1.00 mg/dL 1.13(H) 2.31(H) 3.39(H)  Sodium 135 - 145 mmol/L 138 135 133(L)  Potassium 3.5 - 5.1 mmol/L 3.4(L) 3.8 3.6  Chloride 98 - 111 mmol/L 107 104 99  CO2 22 - 32 mmol/L 24 23 23   Calcium 8.9 - 10.3 mg/dL 6.5(L) 6.6(L) 6.5(L)  Total Protein 6.5 - 8.1 g/dL 5.9(L) 6.1(L) 7.1  Total Bilirubin 0.3 - 1.2 mg/dL 0.8 1.0 1.1  Alkaline Phos 38 - 126 U/L 62 61 71  AST 15 - 41 U/L 51(H) 28 37  ALT 0 - 44 U/L 19 14 17        Micro Results Recent Results (from the past 240 hour(s))  Resp Panel by RT-PCR (Flu A&B, Covid) Nasopharyngeal Swab     Status: Abnormal   Collection Time: 09/25/20  6:21 PM   Specimen: Nasopharyngeal Swab; Nasopharyngeal(NP) swabs in vial transport medium  Result Value Ref Range Status   SARS Coronavirus 2 by RT PCR POSITIVE (A) NEGATIVE Final    Comment: RESULT CALLED TO, READ BACK BY AND VERIFIED WITH: GESELL,K ON 09/25/20 AT 2235 BY LOY,C (NOTE) SARS-CoV-2 target nucleic acids are DETECTED.  The SARS-CoV-2 RNA is generally detectable in upper respiratory specimens during the acute phase of infection. Positive results are indicative of the presence of the identified virus, but do not rule out bacterial infection or co-infection with other pathogens not detected by the test. Clinical correlation with patient history and other diagnostic information is necessary to determine patient infection status. The expected result is  Negative.  Fact Sheet for Patients: EntrepreneurPulse.com.au  Fact Sheet for Healthcare Providers: IncredibleEmployment.be  This test is not yet approved or cleared  by the [place] and  has been authorized for detection and/or diagnosis of SARS-CoV-2 by FDA under an Emergency Use Authorization (EUA).  This EUA will remain in effect (meaning this test can  be used) for the duration of  the COVID-19 declaration under Section 564(b)(1) of the Act, 21 U.S.C. section 360bbb-3(b)(1), unless the authorization is terminated or revoked sooner.     Influenza A by PCR NEGATIVE NEGATIVE Final   Influenza B by PCR NEGATIVE NEGATIVE Final    Comment: (NOTE) The Xpert Xpress SARS-CoV-2/FLU/RSV plus assay is intended as an aid in the diagnosis of influenza from Nasopharyngeal swab specimens and should not be used as a sole basis for treatment. Nasal washings and aspirates are unacceptable for Xpert Xpress SARS-CoV-2/FLU/RSV testing.  Fact Sheet for Patients: EntrepreneurPulse.com.au  Fact Sheet for Healthcare Providers: IncredibleEmployment.be  This test is not yet approved or cleared by the [place] FDA and has been authorized for detection and/or diagnosis of SARS-CoV-2 by FDA under an Emergency Use Authorization (EUA). This EUA will remain in effect (meaning this test can be used) for the duration of the COVID-19 declaration under Section 564(b)(1) of the Act, 21 U.S.C. section 360bbb-3(b)(1), unless the authorization is terminated or revoked.  Performed at [facility], [address] [place], [place] [address]   Blood culture (routine x 2)     Status: None (Preliminary result)   Collection Time: 09/25/20  6:21 PM   Specimen: BLOOD  Result Value Ref Range Status   Specimen Description BLOOD LEFT ANTECUBITAL  Final   Special Requests   Final    BOTTLES DRAWN AEROBIC AND ANAEROBIC Blood Culture  adequate volume   Culture   Final    NO GROWTH 2 DAYS Performed at [facility], [address], [place], [place] [address]    Report Status PENDING  Incomplete  Blood culture (routine x 2)     Status: None (Preliminary result)   Collection Time: 09/25/20  8:05 PM   Specimen: BLOOD RIGHT ARM  Result Value Ref Range Status   Specimen Description BLOOD RIGHT ARM  Final   Special Requests   Final    BOTTLES DRAWN AEROBIC AND ANAEROBIC Blood Culture adequate volume   Culture   Final    NO GROWTH 2 DAYS Performed at [facility], [address], [place], [place] [address]    Report Status PENDING  Incomplete  Urine Culture     Status: Abnormal (Preliminary result)   Collection Time: 09/26/20 12:09 AM   Specimen: Urine, Clean Catch  Result Value Ref Range Status   Specimen Description   Final    URINE, CLEAN CATCH Performed at [facility], [address], [place], [place] [address]    Special Requests   Final    NONE Performed at [facility], [address], [place], [place] [address]    Culture (A)  Final    >=100,000 COLONIES/mL PROTEUS MIRABILIS SUSCEPTIBILITIES TO FOLLOW Performed at [facility], [place] [address] [place], [place] [address]    Report Status PENDING  Incomplete    Radiology Reports DG CHEST PORT 1 VIEW  Result Date: 09/26/2020 CLINICAL DATA:  Positive COVID-19.  Weakness and fever. EXAM: PORTABLE CHEST 1 VIEW COMPARISON:  Chest radiograph dated 03/26/2020. FINDINGS: Background of emphysema. No focal consolidation, pleural effusion, or pneumothorax. Stable cardiac silhouette. Atherosclerotic calcification of the aorta. Osteopenia with degenerative changes of the spine. No acute osseous pathology. IMPRESSION: 1. No acute cardiopulmonary process. 2. Emphysema. Electronically  Signed   By: Anner Crete M.D.   On: 09/26/2020 00:40    SIGNED: Deatra James, MD, FHM. [facility],  Pager (please use [email or web address] to page/text) Please use Epic Secure  Chat for non-urgent communication (7AM-7PM)  If 7PM-7AM, please contact night-coverage [email or web address], 09/27/2020, 12:25 PM

## 2020-09-28 DIAGNOSIS — N390 Urinary tract infection, site not specified: Secondary | ICD-10-CM | POA: Diagnosis not present

## 2020-09-28 LAB — COMPREHENSIVE METABOLIC PANEL
ALT: 16 U/L (ref 0–44)
AST: 36 U/L (ref 15–41)
Albumin: 2.3 g/dL — ABNORMAL LOW (ref 3.5–5.0)
Alkaline Phosphatase: 61 U/L (ref 38–126)
Anion gap: 9 (ref 5–15)
BUN: 13 mg/dL (ref 8–23)
CO2: 24 mmol/L (ref 22–32)
Calcium: 6.9 mg/dL — ABNORMAL LOW (ref 8.9–10.3)
Chloride: 104 mmol/L (ref 98–111)
Creatinine, Ser: 0.89 mg/dL (ref 0.44–1.00)
GFR, Estimated: >60 mL/min (ref >60)
Glucose, Bld: 103 mg/dL — ABNORMAL HIGH (ref 70–99)
Potassium: 3.9 mmol/L (ref 3.5–5.1)
Sodium: 137 mmol/L (ref 135–145)
Total Bilirubin: 0.9 mg/dL (ref 0.3–1.2)
Total Protein: 5.8 g/dL — ABNORMAL LOW (ref 6.5–8.1)

## 2020-09-28 LAB — URINE CULTURE: Culture: >=100000 — AB

## 2020-09-28 LAB — GLUCOSE, CAPILLARY
Glucose-Capillary: 85 mg/dL (ref 70–99)
Glucose-Capillary: 125 mg/dL — ABNORMAL HIGH (ref 70–99)
Glucose-Capillary: 147 mg/dL — ABNORMAL HIGH (ref 70–99)
Glucose-Capillary: 138 mg/dL — ABNORMAL HIGH (ref 70–99)

## 2020-09-28 LAB — MAGNESIUM: Magnesium: 1.9 mg/dL (ref 1.7–2.4)

## 2020-09-28 MED ORDER — CALCIUM GLUCONATE-NACL 1-0.675 GM/50ML-% IV SOLN
1.0000 g | INTRAVENOUS | Status: AC
Start: 2020-09-28 — End: 2020-09-28
  Administered 2020-09-28 (×2): 1000 mg via INTRAVENOUS
  Filled 2020-09-28 (×2): qty 50

## 2020-09-28 MED ORDER — CALCIUM GLUCONATE-NACL 2-0.675 GM/100ML-% IV SOLN: 2.0000 g | Freq: Once | INTRAVENOUS | Status: DC

## 2020-09-28 MED ORDER — ALBUMIN HUMAN 25 % IV SOLN
25.0000 g | Freq: Once | INTRAVENOUS | Status: AC
  Administered 2020-09-28: 25 g via INTRAVENOUS
  Filled 2020-09-28: qty 100

## 2020-09-28 MED ORDER — POTASSIUM CHLORIDE CRYS ER 20 MEQ PO TBCR
40.0000 meq | EXTENDED_RELEASE_TABLET | Freq: Once | ORAL | Status: AC
  Administered 2020-09-28: 40 meq via ORAL
  Filled 2020-09-28: qty 2

## 2020-09-28 NOTE — Progress Notes (Signed)
PROGRESS NOTE    Patient: Kim Mills                            PCP: Lindell Spar, MD                    DOB: 07/29/1940            DOA: 09/25/2020 ERD:[id number]             DOS: 09/28/2020, 11:26 AM   LOS: 2 days   Date of Service: The patient was seen and examined on 09/28/2020  Subjective:   The patient was seen and examined this morning, much more awake alert, interactive, neck stiffness pain has improved, range of motion has improved, complaining of constipation Dynamically stable  Son present at bedside    Brief Narrative:    Kim Mills   is a 80 year old female with PMH of multiple myeloma, HTN, CAD, DM with neuropathy, CKD stage 3, hypothyroidism, overactive bladder and chronic pain who presents ...  c/o neck pain/stiffness for last 5 days Then on the 15th patient was more fatigued, and complaining of right-sided neck stiffness that radiated down At baseline patient is able to use wheelchair, walk with walker approximately 10 feet.  She uses her wheelchair most of the time.  Now she is unable to do any of that-in total assist, needing assist with all ADLs. Also was complaining of dysuria past few days.  PCP started Bactrim she has not filled it yet    Patient is vaccinated for COVID with 2 shots, not boosted yet.  She had a positive COVID test 30 days ago, negative COVID test 10 days ago, and then a positive COVID test on this admission..  It is unclear if this is the same residual infection.  He likely is with a false negative in between.   Patient does not smoke, does not drink.  She would like to be DNR per daughter report.   ED: T-max 101.3, RR 13-14, BP 116/45, HR 73, satting 100% room air, No leukocytosis wi WBC of9.1, hemoglobin 11.0, ,CMP: Na 133, and elevated BUN at 33, elevated creatinine at 3.39 (baseline 0.85), glucose 167 Tylenol given, IV Rocephin 1 g, 1 L NS bolus -given in ED - COVID-positive - UA positive for UTI, urine culture pending-  -  Blood culture pending - EKG shows a heart rate of 84, sinus rhythm, QTC 490   Assessment & Plan:    Active Problems:   Type 2 diabetes mellitus (HCC)   Hypothyroidism   Hypertension   Hyperlipidemia   Generalized weakness   AKI (acute kidney injury) ([place])       AKI Resolved-responded to IV fluid resuscitation Baseline creatinine 0.5 - 1.37  Today creatinine 3.39 >> 2.31  >>> 1.13 >> 0.89  Likely due to poor p.o. intake, dehydration, UTI   Encourage p.o. intake -encouraging daughter to assist oral intake Will avoid nephrotoxins COVID-positive Satting 98% on room air Negative for any respiratory distress Vaccinated Seems to be residual positive result No aggressive treatment recommended at this time UTI Much improved dysuria fever, hemodynamically stable   continue IV Rocephin Urine culture blood culture >>>> > 100 K Proteus mirabilis, pansensitive (with exception of nitrofurantoin)   Acute metabolic encephalopathy Secondary to AKI, dehydration, UTI Back to baseline -Resolved   Generalized weakness PT eval and treat >> anticipating home health Per daughter patient does not ambulate at  baseline with significant right-sided weakness -Anticipating resuming full home health upon discharge   Diabetes mellitus type 2 Sliding scale coverage Hemoglobin A1c 6.7 Will check CBG q. ACH S   Hypothyroidism TSH 3.4 Continue Synthroid Protein calorie malnutrition Secondary to poor p.o. intake Encourage p.o. intake Hypocalcemia/hypomagnesemia/hypoalbuminemia Calcium 6.4, corrected calcium for albumin 6.9 1 g calcium gluconate Magnesium 1.6-2 g IV magnesium was given IV albumin given 09/29/2018 Torticollis Monitor for progress As needed Valium, muscle relaxants Improving     ------------------------------------------------------------------------------------------------------------------------------------------------ Cultures; Urine Culture  > 100 K colonies of Proteus  mirabilis, pansensitive with exception of nitrofurantoin Blood cultures x2 >> no growth to date    Antimicrobials: IV Rocephin   Consultants: None     ------------------------------------------------------------------------------------------------------------------------------------------------   DVT prophylaxis:  SCD/Compression stockings and Heparin SQ Code Status:   Code Status: DNR   Family Communication: Daughter at bedside, updated regarding current findings plan of care 09/27/2020  --son present at bedside updated 09/28/2020 The above findings and plan of care has been discussed with patient (and family)  in detail,  they expressed understanding and agreement of above. -Advance care planning has been discussed.    Admission status:   Status is: Inpatient   Remains inpatient appropriate because:IV treatments appropriate due to intensity of illness or inability to take PO and Inpatient level of care appropriate due to severity of illness   Dispo: The patient is from: Home              Anticipated d/c is to: Home with home health in 1-2 days               Patient currently is not medically stable to d/c.              Difficult to place patient No       Level of care: Telemetry   Procedures:   No admission procedures for hospital encounter.    Antimicrobials:  Anti-infectives (From admission, onward)    Start     Dose/Rate Route Frequency Ordered Stop   09/26/20 2200  cefTRIAXone (ROCEPHIN) 1 g in sodium chloride 0.9 % 100 mL IVPB        1 g 200 mL/hr over 30 Minutes Intravenous Every 24 hours 09/26/20 0243     09/25/20 2300  cefTRIAXone (ROCEPHIN) 1 g in sodium chloride 0.9 % 100 mL IVPB        1 g 200 mL/hr over 30 Minutes Intravenous  Once 09/25/20 2259 09/26/20 0119        Medication:   aspirin EC  81 mg Oral Daily   cyclobenzaprine  5 mg Oral TID   diazepam  2 mg Oral Q8H   diazepam  5 mg Oral Once   gabapentin  300 mg Oral QHS   heparin  5,000  Units Subcutaneous Q8H   insulin aspart  0-15 Units Subcutaneous TID WC   insulin aspart  0-5 Units Subcutaneous QHS   levothyroxine  137 mcg Oral QAC breakfast   melatonin  6 mg Oral QHS   metoprolol tartrate  12.5 mg Oral BID   polyethylene glycol  17 g Oral Daily   simvastatin  40 mg Oral q1800    acetaminophen **OR** acetaminophen, albuterol, ondansetron **OR** ondansetron (ZOFRAN) IV, oxyCODONE   Objective:   Vitals:   09/27/20 1504 09/27/20 1942 09/28/20 0500 09/28/20 0538  BP: (!) 150/131 (!) 108/51  (!) 100/48  Pulse: 87 79  70  Resp: 16 18  18   Temp: 97.9  F (36.6 C) 99.5 F (37.5 C)  98.9 F (37.2 C)  TempSrc: Oral Oral  Oral  SpO2: 97% 100%  98%  Weight:   73.9 kg   Height:        Intake/Output Summary (Last 24 hours) at 09/28/2020 1126 Last data filed at 09/28/2020 0900 Gross per 24 hour  Intake 1488.46 ml  Output 1100 ml  Net 388.46 ml   Filed Weights   09/26/20 1806 09/27/20 0500 09/28/20 0500  Weight: 73 kg 73.8 kg 73.9 kg     Examination:        Physical Exam:   General:  Alert, oriented, cooperative, no distress; bedbound  HEENT:  Normocephalic, PERRL, otherwise with in Normal limits   Neuro:  CNII-XII intact. , normal motor and sensation, reflexes intact   Lungs:   Clear to auscultation BL, Respirations unlabored, no wheezes / crackles  Cardio:    S1/S2, RRR, No murmure, No Rubs or Gallops   Abdomen:   Soft, non-tender, bowel sounds active all four quadrants,  no guarding or peritoneal signs.  Muscular skeletal:  Severe generalized global weakness-Limited exam - in bed, moves her arm in bed - 2+ pulses,  symmetric, +1 pitting edema Due to multiple sclerosis multiple joint pain, unable to have a full range of motion on right upper arm, right knee..  Able to move left upper and lower extremity in bed Lower extremity  edema bilaterally right > L Neck muscle tenderness, stiffness improving, range of motion the neck is improving  Skin:  Dry,  warm to touch, negative for any Rashes,  Wounds: Please see nursing documentation          ------------------------------------------------------------------------------------------------------------------------------------------    LABs:  CBC Latest Ref Rng & Units 09/26/2020 09/25/2020 09/15/2020  WBC 4.0 - 10.5 K/uL 8.2 9.1 7.4  Hemoglobin 12.0 - 15.0 g/dL 9.9(L) 11.0(L) 11.9(L)  Hematocrit 36.0 - 46.0 % 32.1(L) 36.6 38.4  Platelets 150 - 400 K/uL 127(L) 161 345   CMP Latest Ref Rng & Units 09/28/2020 09/27/2020 09/26/2020  Glucose 70 - 99 mg/dL 103(H) 139(H) 176(H)  BUN 8 - 23 mg/dL 13 21 35(H)  Creatinine 0.44 - 1.00 mg/dL 0.89 1.13(H) 2.31(H)  Sodium 135 - 145 mmol/L 137 138 135  Potassium 3.5 - 5.1 mmol/L 3.9 3.4(L) 3.8  Chloride 98 - 111 mmol/L 104 107 104  CO2 22 - 32 mmol/L 24 24 23   Calcium 8.9 - 10.3 mg/dL 6.9(L) 6.5(L) 6.6(L)  Total Protein 6.5 - 8.1 g/dL 5.8(L) 5.9(L) 6.1(L)  Total Bilirubin 0.3 - 1.2 mg/dL 0.9 0.8 1.0  Alkaline Phos 38 - 126 U/L 61 62 61  AST 15 - 41 U/L 36 51(H) 28  ALT 0 - 44 U/L 16 19 14        Micro Results Recent Results (from the past 240 hour(s))  Resp Panel by RT-PCR (Flu A&B, Covid) Nasopharyngeal Swab     Status: Abnormal   Collection Time: 09/25/20  6:21 PM   Specimen: Nasopharyngeal Swab; Nasopharyngeal(NP) swabs in vial transport medium  Result Value Ref Range Status   SARS Coronavirus 2 by RT PCR POSITIVE (A) NEGATIVE Final    Comment: RESULT CALLED TO, READ BACK BY AND VERIFIED WITH: GESELL,K ON 09/25/20 AT 2235 BY LOY,C (NOTE) SARS-CoV-2 target nucleic acids are DETECTED.  The SARS-CoV-2 RNA is generally detectable in upper respiratory specimens during the acute phase of infection. Positive results are indicative of the presence of the identified virus, but do not rule out bacterial infection  or co-infection with other pathogens not detected by the test. Clinical correlation with patient history and other diagnostic  information is necessary to determine patient infection status. The expected result is Negative.  Fact Sheet for Patients: EntrepreneurPulse.com.au  Fact Sheet for Healthcare Providers: IncredibleEmployment.be  This test is not yet approved or cleared by the [place] FDA and  has been authorized for detection and/or diagnosis of SARS-CoV-2 by FDA under an Emergency Use Authorization (EUA).  This EUA will remain in effect (meaning this test can  be used) for the duration of  the COVID-19 declaration under Section 564(b)(1) of the Act, 21 U.S.C. section 360bbb-3(b)(1), unless the authorization is terminated or revoked sooner.     Influenza A by PCR NEGATIVE NEGATIVE Final   Influenza B by PCR NEGATIVE NEGATIVE Final    Comment: (NOTE) The Xpert Xpress SARS-CoV-2/FLU/RSV plus assay is intended as an aid in the diagnosis of influenza from Nasopharyngeal swab specimens and should not be used as a sole basis for treatment. Nasal washings and aspirates are unacceptable for Xpert Xpress SARS-CoV-2/FLU/RSV testing.  Fact Sheet for Patients: EntrepreneurPulse.com.au  Fact Sheet for Healthcare Providers: IncredibleEmployment.be  This test is not yet approved or cleared by the [place] FDA and has been authorized for detection and/or diagnosis of SARS-CoV-2 by FDA under an Emergency Use Authorization (EUA). This EUA will remain in effect (meaning this test can be used) for the duration of the COVID-19 declaration under Section 564(b)(1) of the Act, 21 U.S.C. section 360bbb-3(b)(1), unless the authorization is terminated or revoked.  Performed at [facility], [address] [place], [place] [address]   Blood culture (routine x 2)     Status: None (Preliminary result)   Collection Time: 09/25/20  6:21 PM   Specimen: BLOOD  Result Value Ref Range Status   Specimen Description BLOOD LEFT ANTECUBITAL  Final    Special Requests   Final    BOTTLES DRAWN AEROBIC AND ANAEROBIC Blood Culture adequate volume   Culture   Final    NO GROWTH 2 DAYS Performed at [facility], [address], [place], [place] [address]    Report Status PENDING  Incomplete  Blood culture (routine x 2)     Status: None (Preliminary result)   Collection Time: 09/25/20  8:05 PM   Specimen: BLOOD RIGHT ARM  Result Value Ref Range Status   Specimen Description BLOOD RIGHT ARM  Final   Special Requests   Final    BOTTLES DRAWN AEROBIC AND ANAEROBIC Blood Culture adequate volume   Culture   Final    NO GROWTH 2 DAYS Performed at [facility], [address], [place], [place] [address]    Report Status PENDING  Incomplete  Urine Culture     Status: Abnormal   Collection Time: 09/26/20 12:09 AM   Specimen: Urine, Clean Catch  Result Value Ref Range Status   Specimen Description   Final    URINE, CLEAN CATCH Performed at [facility], [address], [place], [place] [address]    Special Requests   Final    NONE Performed at [facility], [address], [place], [place] [address]    Culture >=100,000 COLONIES/mL PROTEUS MIRABILIS (A)  Final   Report Status 09/28/2020 FINAL  Final   Organism ID, Bacteria PROTEUS MIRABILIS (A)  Final      Susceptibility   Proteus mirabilis - MIC*    AMPICILLIN <=2 SENSITIVE Sensitive     CEFAZOLIN <=4 SENSITIVE Sensitive  CEFEPIME <=0.12 SENSITIVE Sensitive     CEFTRIAXONE <=0.25 SENSITIVE Sensitive     CIPROFLOXACIN <=0.25 SENSITIVE Sensitive     GENTAMICIN <=1 SENSITIVE Sensitive     IMIPENEM 2 SENSITIVE Sensitive     NITROFURANTOIN 128 RESISTANT Resistant     TRIMETH/SULFA <=20 SENSITIVE Sensitive     AMPICILLIN/SULBACTAM <=2 SENSITIVE Sensitive     PIP/TAZO <=4 SENSITIVE Sensitive     * >=100,000 COLONIES/mL PROTEUS MIRABILIS    Radiology Reports DG CHEST PORT 1 VIEW  Result Date: 09/26/2020 CLINICAL DATA:  Positive COVID-19.  Weakness and fever. EXAM: PORTABLE CHEST 1  VIEW COMPARISON:  Chest radiograph dated 03/26/2020. FINDINGS: Background of emphysema. No focal consolidation, pleural effusion, or pneumothorax. Stable cardiac silhouette. Atherosclerotic calcification of the aorta. Osteopenia with degenerative changes of the spine. No acute osseous pathology. IMPRESSION: 1. No acute cardiopulmonary process. 2. Emphysema. Electronically Signed   By: Anner Crete M.D.   On: 09/26/2020 00:40    SIGNED: Deatra James, MD, FHM. [facility],  Pager (please use [email or web address] to page/text) Please use Epic Secure Chat for non-urgent communication (7AM-7PM)  If 7PM-7AM, please contact night-coverage [email or web address], 09/28/2020, 11:26 AM

## 2020-09-29 DIAGNOSIS — N390 Urinary tract infection, site not specified: Secondary | ICD-10-CM | POA: Diagnosis not present

## 2020-09-29 LAB — COMPREHENSIVE METABOLIC PANEL
ALT: 19 U/L (ref 0–44)
AST: 36 U/L (ref 15–41)
Albumin: 2.2 g/dL — ABNORMAL LOW (ref 3.5–5.0)
Alkaline Phosphatase: 50 U/L (ref 38–126)
Anion gap: 6 (ref 5–15)
BUN: 10 mg/dL (ref 8–23)
CO2: 22 mmol/L (ref 22–32)
Calcium: 7.1 mg/dL — ABNORMAL LOW (ref 8.9–10.3)
Chloride: 108 mmol/L (ref 98–111)
Creatinine, Ser: 0.82 mg/dL (ref 0.44–1.00)
GFR, Estimated: >60 mL/min (ref >60)
Glucose, Bld: 123 mg/dL — ABNORMAL HIGH (ref 70–99)
Potassium: 3.9 mmol/L (ref 3.5–5.1)
Sodium: 136 mmol/L (ref 135–145)
Total Bilirubin: 0.7 mg/dL (ref 0.3–1.2)
Total Protein: 5.4 g/dL — ABNORMAL LOW (ref 6.5–8.1)

## 2020-09-29 LAB — GLUCOSE, CAPILLARY
Glucose-Capillary: 110 mg/dL — ABNORMAL HIGH (ref 70–99)
Glucose-Capillary: 133 mg/dL — ABNORMAL HIGH (ref 70–99)

## 2020-09-29 MED ORDER — CYCLOBENZAPRINE HCL 5 MG PO TABS: 5.0000 mg | ORAL_TABLET | Freq: Three times a day (TID) | ORAL | 0 refills | Status: AC

## 2020-09-29 MED ORDER — ZINC OXIDE 20 % EX OINT: 1.0000 "application " | TOPICAL_OINTMENT | CUTANEOUS | 0 refills | Status: DC | PRN

## 2020-09-29 MED ORDER — CIPROFLOXACIN HCL 250 MG PO TABS: 500.0000 mg | ORAL_TABLET | Freq: Two times a day (BID) | ORAL | Status: DC

## 2020-09-29 MED ORDER — FLEET ENEMA 7-19 GM/118ML RE ENEM
1.0000 | ENEMA | Freq: Once | RECTAL | Status: AC
  Administered 2020-09-29: 1 via RECTAL

## 2020-09-29 MED ORDER — POLYETHYLENE GLYCOL 3350 17 G PO PACK: 17.0000 g | PACK | Freq: Every day | ORAL | 0 refills | Status: DC

## 2020-09-29 MED ORDER — DOCUSATE SODIUM 100 MG PO CAPS
100.0000 mg | ORAL_CAPSULE | Freq: Two times a day (BID) | ORAL | Status: DC
  Administered 2020-09-29: 100 mg via ORAL
  Filled 2020-09-29: qty 1

## 2020-09-29 MED ORDER — DOCUSATE SODIUM 100 MG PO CAPS: 100.0000 mg | ORAL_CAPSULE | Freq: Two times a day (BID) | ORAL | 2 refills | Status: AC

## 2020-09-29 MED ORDER — CIPROFLOXACIN HCL 500 MG PO TABS: 500.0000 mg | ORAL_TABLET | Freq: Two times a day (BID) | ORAL | 0 refills | Status: AC

## 2020-09-29 MED ORDER — ALBUMIN HUMAN 25 % IV SOLN
25.0000 g | Freq: Once | INTRAVENOUS | Status: AC
  Administered 2020-09-29: 25 g via INTRAVENOUS
  Filled 2020-09-29: qty 100

## 2020-09-29 NOTE — TOC Transition Note (Signed)
Transition of Care [facility]) - CM/SW Discharge Note   Patient Details  Name: Kim Mills MRN: [id number] Date of Birth: 12/28/1940  [facility]) CM/SW Contact:  Ihor Gully, LCSW Phone Number: 09/29/2020, 1:09 PM   Clinical Narrative:    Patient referred to [facility] and accepted. [facility] notified of patient's discharge.    Final next level of care: Home w Home Health Services Barriers to Discharge: No Barriers Identified   Patient Goals and CMS Choice Patient states their goals for this hospitalization and ongoing recovery are:: home with [facility]      Discharge Placement                Patient to be transferred to facility by: [place] Name of family member notified: Iris, daughter Patient and family notified of of transfer: 09/29/20  Discharge Plan and Services     Post Acute Care Choice: Home Health                    HH Arranged: RN, PT, OT, Nurse's Aide, Social Work [employer] Agency: [place] ([place]) Date [place]: 09/29/20 Time [place]: 59 Representative spoke with at [place]: [place] ([place]) Interventions     Readmission Risk Interventions No flowsheet data found.

## 2020-09-29 NOTE — Discharge Summary (Signed)
Physician Discharge Summary [facility]    Patient: Kim Mills                   Admit date: 09/25/2020   DOB: 1940-09-02             Discharge date:09/29/2020/11:24 AM ZES:[id number]                          PCP: Lindell Spar, MD  Disposition: HOME with Home Health   Recommendations for Outpatient Follow-up:  -Follow with a PCP, neurologist, oncologist within 2-3 weeks -Continue current medications as prescribed -CBC BMP within 1 week results to PCP -Continue aggressive PT OT at home, repositioning every 2 hours to avoid skin breakdown   Discharge Condition: Stable   Code Status:   Code Status: DNR  Diet recommendation: Diabetic diet   Discharge Diagnoses:    Principal Problem:   Acute lower UTI Active Problems:   Physical debility   Type 2 diabetes mellitus ([place])   Hypothyroidism   Hypertension   Hyperlipidemia   Gait disorder   CKD (chronic kidney disease) stage 3, GFR 30-59 ml/min ([place])   Multiple myeloma not having achieved remission ([place])   Generalized weakness   AKI (acute kidney injury) ([place])   History of Present Illness/ Hospital Course Kim Mills Summary:    Kim Mills   is a 80 year old female with PMH of multiple myeloma, HTN, CAD, DM with neuropathy, CKD stage 3, hypothyroidism, overactive bladder and chronic pain who presents ...  c/o neck pain/stiffness for last 5 days Then on the 15th patient was more fatigued, and complaining of right-sided neck stiffness that radiated down At baseline patient is able to use wheelchair, walk with walker approximately 10 feet.  She uses her wheelchair most of the time.  Now she is unable to do any of that-in total assist, needing assist with all ADLs. Also was complaining of dysuria past few days.  PCP started Bactrim she has not filled it yet     Patient is vaccinated for COVID with 2 shots, not boosted yet.  She had a positive COVID test 30 days ago, negative COVID test 10 days ago, and then a  positive COVID test on this admission..  It is unclear if this is the same residual infection.  He likely is with a false negative in between.   Patient does not smoke, does not drink.  She would like to be DNR per daughter report.   ED: T-max 101.3, RR 13-14, BP 116/45, HR 73, satting 100% room air, No leukocytosis wi WBC of9.1, hemoglobin 11.0, ,CMP: Na 133, and elevated BUN at 33, elevated creatinine at 3.39 (baseline 0.85), glucose 167 Tylenol given, IV Rocephin 1 g, 1 L NS bolus -given in ED - COVID-positive - UA positive for UTI, urine culture pending-  - Blood culture pending - EKG shows a heart rate of 84, sinus rhythm, QTC 490    AKI Resolved-responded to IV fluid resuscitation Baseline creatinine 0.5 - 1.37  Today creatinine 3.39 >> 2.31  >>> 1.13 >> 0.89  Likely due to poor p.o. intake, dehydration, UTI   Encourage p.o. intake -encouraging daughter to assist oral intake Will avoid nephrotoxins COVID-positive Satting 98% on room air Negative for any respiratory distress Vaccinated Seems to be residual positive result No aggressive treatment recommended at this time UTI Much improved dysuria fever, hemodynamically stable   continue IV Rocephin >> switch to p.o. ciprofloxacin  on 09/30/2020 Urine culture blood culture >>>> > 100 K Proteus mirabilis, pansensitive (with exception of nitrofurantoin)   Acute metabolic encephalopathy Secondary to AKI, dehydration, UTI Back to baseline -Resolved   Generalized weakness PT eval and treat >> Home health has been arranged Per daughter patient does not ambulate at baseline with significant right-sided weakness -Anticipating resuming full home health upon discharge   Diabetes mellitus type 2 Resuming home medication metformin and glipizide Hemoglobin A1c 6.7 Will check CBG q. ACH S   Hypothyroidism TSH 3.4 Continue Synthroid Protein calorie malnutrition Secondary to poor p.o. intake Encourage p.o.  intake Hypocalcemia/hypomagnesemia/hypoalbuminemia Calcium 6.4, corrected calcium for albumin 6.9 1 g calcium gluconate was given on this admission Magnesium 1.6-2 g IV magnesium was given on this admission IV albumin given 09/29/2018  Torticollis Monitor for progress Continue muscle relaxants, warm/cold compression Improving     ------------------------------------------------------------------------------------------------------------------------------------------------ Cultures; Urine Culture  > 100 K colonies of Proteus mirabilis, pansensitive with exception of nitrofurantoin Blood cultures x2 >> no growth to date    Antimicrobials: IV Rocephin discontinued 09/29/2020 switched to p.o. Cipro to conclude 7 days of antibiotic coverage due to complicated UTI   Consultants: None     ----------------------------------------------------------------------------------------------------------------------------------------------- Code Status:   Code Status: DNR   Family Communication: Daughter at bedside, discussed in detail The above findings and plan of care has been discussed with patient (and family)  in detail,  they expressed understanding and agreement of above. -Advance care planning has been discussed.     Dispo: The patient is from: Home              Anticipated d/c is to: Home with home health       Discharge Instructions:   Discharge Instructions     Activity as tolerated - No restrictions   Complete by: As directed    Diet - low sodium heart healthy   Complete by: As directed    Discharge instructions   Complete by: As directed    Follow-up with PCP, neurologist, oncologist within 2-3 weeks -Continue current medication including antibiotic course -Continue bowel regimen to avoid further constipation may be supplemented with daily prune juice Would hold laxatives with diarrhea -Reposition every 2 hours to avoid skin breakdown, may use zinc oxide cream to the  skin area -Continue dietary supplement (supplement protein and oral hydration-Pedialyte)   Increase activity slowly   Complete by: As directed         Medication List     STOP taking these medications    amLODipine 10 MG tablet Commonly known as: NORVASC   losartan-hydrochlorothiazide 50-12.5 MG tablet Commonly known as: HYZAAR   prochlorperazine 10 MG tablet Commonly known as: COMPAZINE   sulfamethoxazole-trimethoprim 800-160 MG tablet Commonly known as: BACTRIM DS   tolterodine 4 MG 24 hr capsule Commonly known as: DETROL LA       TAKE these medications    acyclovir 400 MG tablet Commonly known as: ZOVIRAX Take 1 tablet (400 mg total) by mouth 2 (two) times daily.   ASPIRIN 81 PO Take 1 tablet by mouth daily.   Blood Glucose System Pak Kit Please dispense based on patient and insurance preference. Use as directed to monitor FSBS 2x daily. Dx: E11.9.   BLOOD GLUCOSE TEST STRIPS Strp Please dispense based on patient and insurance preference. Use as directed to monitor FSBS 2x daily. Dx: E11.9.   Calcium 500 MG tablet Take 600 mg by mouth 2 (two) times daily.   ciprofloxacin 500 MG tablet  Commonly known as: CIPRO Take 1 tablet (500 mg total) by mouth 2 (two) times daily for 5 days.   cyclobenzaprine 5 MG tablet Commonly known as: FLEXERIL Take 1 tablet (5 mg total) by mouth 3 (three) times daily for 5 days.   docusate sodium 100 MG capsule Commonly known as: Colace Take 1 capsule (100 mg total) by mouth 2 (two) times daily.   fluticasone 50 MCG/ACT nasal spray Commonly known as: FLONASE USE TWO SPRAYS IN BOTH NOSTRILS DAILY What changed: See the new instructions.   gabapentin 300 MG capsule Commonly known as: NEURONTIN TAKE ONE CAPSULE BY MOUTH EVERY NIGHT AT BEDTIME   glipiZIDE 5 MG tablet Commonly known as: GLUCOTROL TAKE ONE TABLET (5MG TOTAL) BY MOUTH TWOTIMES DAILY What changed: See the new instructions.   HM Loratadine 10 MG  tablet Generic drug: loratadine TAKE ONE TABLET (10MG TOTAL) BY MOUTH DAILY What changed: See the new instructions.   HYDROcodone-acetaminophen 5-325 MG tablet Commonly known as: NORCO/VICODIN Take 1 tablet by mouth every 6 (six) hours as needed for moderate pain or severe pain.   Lancets Misc Please dispense based on patient and insurance preference. Use as directed to monitor FSBS 2x daily. Dx: E11.9.   lenalidomide 10 MG capsule Commonly known as: REVLIMID Take 1 capsule (10 mg total) by mouth daily. 14 days on, 7 days off   magic mouthwash w/lidocaine Soln Take 5 mLs by mouth 4 (four) times daily.   magnesium oxide 400 (240 Mg) MG tablet Commonly known as: MAG-OX TAKE ONE TABLET (400MG TOTAL) BY MOUTH TWO TIMES DAILY What changed: See the new instructions.   meclizine 25 MG tablet Commonly known as: ANTIVERT Take 25 mg by mouth every 6 (six) hours as needed for dizziness.   metFORMIN 1000 MG tablet Commonly known as: GLUCOPHAGE TAKE ONE TABLET BY MOUTH TWICE A DAY   metoprolol tartrate 25 MG tablet Commonly known as: LOPRESSOR TAKE ONE-HALF TABLET (12.5MG TOTAL) BY MOUTH TWO TIMES DAILY What changed: See the new instructions.   omeprazole 40 MG capsule Commonly known as: PRILOSEC TAKE ONE CAPSULE BY MOUTH DAILY   polyethylene glycol 17 g packet Commonly known as: MIRALAX / GLYCOLAX Take 17 g by mouth daily. Start taking on: September 30, 2020   potassium chloride SA 20 MEQ tablet Commonly known as: KLOR-CON Take 1 tablet (20 mEq total) by mouth 2 (two) times daily.   simvastatin 40 MG tablet Commonly known as: ZOCOR TAKE ONE TABLET (40MG TOTAL) BY MOUTH BEDTIME What changed: See the new instructions.   Synthroid 137 MCG tablet Generic drug: levothyroxine TAKE ONE TABLET (137MCG TOTAL) BY MOUTH DAILY BEFORE BREAKFAST What changed: See the new instructions.   traMADol 50 MG tablet Commonly known as: ULTRAM TAKE ONE TABLET (50MG TOTAL) BY MOUTH TWO TIMES  DAILY AS NEEDED What changed: See the new instructions.   zinc oxide 20 % ointment Commonly known as: [employer] Zinc Oxide Apply 1 application topically as needed for irritation.        No Known Allergies   Procedures /Studies:   DG CHEST PORT 1 VIEW  Result Date: 09/26/2020 CLINICAL DATA:  Positive COVID-19.  Weakness and fever. EXAM: PORTABLE CHEST 1 VIEW COMPARISON:  Chest radiograph dated 03/26/2020. FINDINGS: Background of emphysema. No focal consolidation, pleural effusion, or pneumothorax. Stable cardiac silhouette. Atherosclerotic calcification of the aorta. Osteopenia with degenerative changes of the spine. No acute osseous pathology. IMPRESSION: 1. No acute cardiopulmonary process. 2. Emphysema. Electronically Signed   By: Milas Hock  Radparvar M.D.   On: 09/26/2020 00:40    Subjective:   Patient was seen and examined 09/29/2020, 11:24 AM Patient stable today. No acute distress.  No issues overnight Stable for discharge.  Discharge Exam:    Vitals:   09/28/20 1510 09/28/20 2021 09/29/20 0500 09/29/20 0538  BP: (!) 117/46 (!) 108/40  (!) 103/39  Pulse: 74 72  75  Resp: 16 18  18   Temp: 97.8 F (36.6 C) 98.2 F (36.8 C)  99.1 F (37.3 C)  TempSrc: Oral   Oral  SpO2: 96% 100%  95%  Weight:   74 kg   Height:        General: Pt lying comfortably in bed & appears in no obvious distress. Cardiovascular: S1 & S2 heard, RRR, S1/S2 +. No murmurs, rubs, gallops or clicks. No JVD or pedal edema. Respiratory: Clear to auscultation without wheezing, rhonchi or crackles. No increased work of breathing. Abdominal:  Non-distended, non-tender & soft. No organomegaly or masses appreciated. Normal bowel sounds heard. CNS: Alert and oriented. No focal deficits. Extremities: no edema, no cyanosis      The results of significant diagnostics from this hospitalization (including imaging, microbiology, ancillary and laboratory) are listed below for reference.      Microbiology:    Recent Results (from the past 240 hour(s))  Resp Panel by RT-PCR (Flu A&B, Covid) Nasopharyngeal Swab     Status: Abnormal   Collection Time: 09/25/20  6:21 PM   Specimen: Nasopharyngeal Swab; Nasopharyngeal(NP) swabs in vial transport medium  Result Value Ref Range Status   SARS Coronavirus 2 by RT PCR POSITIVE (A) NEGATIVE Final    Comment: RESULT CALLED TO, READ BACK BY AND VERIFIED WITH: GESELL,K ON 09/25/20 AT 2235 BY LOY,C (NOTE) SARS-CoV-2 target nucleic acids are DETECTED.  The SARS-CoV-2 RNA is generally detectable in upper respiratory specimens during the acute phase of infection. Positive results are indicative of the presence of the identified virus, but do not rule out bacterial infection or co-infection with other pathogens not detected by the test. Clinical correlation with patient history and other diagnostic information is necessary to determine patient infection status. The expected result is Negative.  Fact Sheet for Patients: EntrepreneurPulse.com.au  Fact Sheet for Healthcare Providers: IncredibleEmployment.be  This test is not yet approved or cleared by the [place] FDA and  has been authorized for detection and/or diagnosis of SARS-CoV-2 by FDA under an Emergency Use Authorization (EUA).  This EUA will remain in effect (meaning this test can  be used) for the duration of  the COVID-19 declaration under Section 564(b)(1) of the Act, 21 U.S.C. section 360bbb-3(b)(1), unless the authorization is terminated or revoked sooner.     Influenza A by PCR NEGATIVE NEGATIVE Final   Influenza B by PCR NEGATIVE NEGATIVE Final    Comment: (NOTE) The Xpert Xpress SARS-CoV-2/FLU/RSV plus assay is intended as an aid in the diagnosis of influenza from Nasopharyngeal swab specimens and should not be used as a sole basis for treatment. Nasal washings and aspirates are unacceptable for Xpert Xpress  SARS-CoV-2/FLU/RSV testing.  Fact Sheet for Patients: EntrepreneurPulse.com.au  Fact Sheet for Healthcare Providers: IncredibleEmployment.be  This test is not yet approved or cleared by the [place] FDA and has been authorized for detection and/or diagnosis of SARS-CoV-2 by FDA under an Emergency Use Authorization (EUA). This EUA will remain in effect (meaning this test can be used) for the duration of the COVID-19 declaration under Section 564(b)(1) of the Act, 21 U.S.C. section  360bbb-3(b)(1), unless the authorization is terminated or revoked.  Performed at [facility], [address], [place], [place] [address]   Blood culture (routine x 2)     Status: None (Preliminary result)   Collection Time: 09/25/20  6:21 PM   Specimen: BLOOD  Result Value Ref Range Status   Specimen Description BLOOD LEFT ANTECUBITAL  Final   Special Requests   Final    BOTTLES DRAWN AEROBIC AND ANAEROBIC Blood Culture adequate volume   Culture   Final    NO GROWTH 4 DAYS Performed at [facility], [address] [place], [place] [address]    Report Status PENDING  Incomplete  Blood culture (routine x 2)     Status: None (Preliminary result)   Collection Time: 09/25/20  8:05 PM   Specimen: BLOOD RIGHT ARM  Result Value Ref Range Status   Specimen Description BLOOD RIGHT ARM  Final   Special Requests   Final    BOTTLES DRAWN AEROBIC AND ANAEROBIC Blood Culture adequate volume   Culture   Final    NO GROWTH 4 DAYS Performed at [facility], [address] [place], [place] [address]    Report Status PENDING  Incomplete  Urine Culture     Status: Abnormal   Collection Time: 09/26/20 12:09 AM   Specimen: Urine, Clean Catch  Result Value Ref Range Status   Specimen Description   Final    URINE, CLEAN CATCH Performed at [facility], [address], [place], [place] [address]    Special Requests   Final    NONE Performed at [facility], [address], [place],  [address]    Culture >=100,000 COLONIES/mL PROTEUS MIRABILIS (A)  Final   Report Status 09/28/2020 FINAL  Final   Organism ID, Bacteria PROTEUS MIRABILIS (A)  Final      Susceptibility   Proteus mirabilis - MIC*    AMPICILLIN <=2 SENSITIVE Sensitive     CEFAZOLIN <=4 SENSITIVE Sensitive     CEFEPIME <=0.12 SENSITIVE Sensitive     CEFTRIAXONE <=0.25 SENSITIVE Sensitive     CIPROFLOXACIN <=0.25 SENSITIVE Sensitive     GENTAMICIN <=1 SENSITIVE Sensitive     IMIPENEM 2 SENSITIVE Sensitive     NITROFURANTOIN 128 RESISTANT Resistant     TRIMETH/SULFA <=20 SENSITIVE Sensitive     AMPICILLIN/SULBACTAM <=2 SENSITIVE Sensitive     PIP/TAZO <=4 SENSITIVE Sensitive     * >=100,000 COLONIES/mL PROTEUS MIRABILIS     Labs:   CBC: Recent Labs  Lab 09/25/20 1821 09/26/20 0633  WBC 9.1 8.2  NEUTROABS 5.1 5.0  HGB 11.0* 9.9*  HCT 36.6 32.1*  MCV 92.4 92.5  PLT 161 572*   Basic Metabolic Panel: Recent Labs  Lab 09/24/20 1517 09/25/20 1821 09/26/20 0633 09/27/20 0730 09/28/20 0631 09/28/20 0725 09/29/20 0846  NA 137 133* 135 138 137  --  136  K 3.7 3.6 3.8 3.4* 3.9  --  3.9  CL 99 99 104 107 104  --  108  CO2 22 23 23 24 24   --  22  GLUCOSE 195* 167* 176* 139* 103*  --  123*  BUN 21 33* 35* 21 13  --  10  CREATININE 1.37* 3.39* 2.31* 1.13* 0.89  --  0.82  CALCIUM 7.6* 6.5* 6.6* 6.5* 6.9*  --  7.1*  MG 1.6  --  1.6*  --   --  1.9  --    Liver Function Tests: Recent Labs  Lab 09/25/20 1821 09/26/20  0228 09/27/20 0730 09/28/20 0631 09/29/20 0846  AST 37 28 51* 36 36  ALT 17 14 19 16 19   ALKPHOS 71 61 62 61 50  BILITOT 1.1 1.0 0.8 0.9 0.7  PROT 7.1 6.1* 5.9* 5.8* 5.4*  ALBUMIN 3.4* 2.7* 2.5* 2.3* 2.2*   BNP (last 3 results) No results for input(s): BNP in the last 8760 hours. Cardiac Enzymes: No results for input(s): CKTOTAL, CKMB, CKMBINDEX, TROPONINI in the last 168 hours. CBG: Recent Labs  Lab 09/28/20 0754 09/28/20 1203 09/28/20 1714  09/28/20 2019 09/29/20 0738  GLUCAP 85 125* 147* 138* 110*   Hgb A1c No results for input(s): HGBA1C in the last 72 hours. Lipid Profile No results for input(s): CHOL, HDL, LDLCALC, TRIG, CHOLHDL, LDLDIRECT in the last 72 hours. Thyroid function studies No results for input(s): TSH, T4TOTAL, T3FREE, THYROIDAB in the last 72 hours.  Invalid input(s): FREET3 Anemia work up No results for input(s): VITAMINB12, FOLATE, FERRITIN, TIBC, IRON, RETICCTPCT in the last 72 hours. Urinalysis    Component Value Date/Time   COLORURINE YELLOW 09/25/2020 1936   APPEARANCEUR TURBID (A) 09/25/2020 1936   LABSPEC 1.014 09/25/2020 1936   PHURINE 7.0 09/25/2020 1936   GLUCOSEU NEGATIVE 09/25/2020 1936   HGBUR SMALL (A) 09/25/2020 1936   BILIRUBINUR NEGATIVE 09/25/2020 1936   BILIRUBINUR neg 08/27/2019 1455   KETONESUR 5 (A) 09/25/2020 1936   PROTEINUR 100 (A) 09/25/2020 1936   UROBILINOGEN 2.0 (A) 08/27/2019 1455   NITRITE NEGATIVE 09/25/2020 1936   LEUKOCYTESUR LARGE (A) 09/25/2020 1936         Time coordinating discharge: Over 45 minutes  SIGNED: Deatra James, MD, FACP, FHM. [facility],  Please use [email or web address] to Page If 7PM-7AM, please contact night-coverage [email or web address] [email or web address] [facility] 09/29/2020, 11:24 AM

## 2020-09-29 NOTE — Care Management Important Message (Signed)
Important Message  Patient Details  Name: Kim Mills MRN: [id number] Date of Birth: 1940/04/07   Medicare Important Message Given:  Yes - Important Message mailed due to current National Emergency     Tommy Medal 09/29/2020, 1:57 PM

## 2020-09-29 NOTE — Progress Notes (Signed)
Patients home medication retrieved from pharmacy and given to patients daughter " Iris " at bedside

## 2020-09-29 NOTE — Progress Notes (Signed)
Patient discharged home via EMS . PPW given to patient . IV removed . Transported via stretcher  without incident

## 2020-09-30 ENCOUNTER — Telehealth: Payer: Self-pay

## 2020-09-30 DIAGNOSIS — I129 Hypertensive chronic kidney disease with stage 1 through stage 4 chronic kidney disease, or unspecified chronic kidney disease: Secondary | ICD-10-CM | POA: Diagnosis not present

## 2020-09-30 DIAGNOSIS — G9341 Metabolic encephalopathy: Secondary | ICD-10-CM | POA: Diagnosis not present

## 2020-09-30 DIAGNOSIS — Z9181 History of falling: Secondary | ICD-10-CM | POA: Diagnosis not present

## 2020-09-30 DIAGNOSIS — B964 Proteus (mirabilis) (morganii) as the cause of diseases classified elsewhere: Secondary | ICD-10-CM | POA: Diagnosis not present

## 2020-09-30 DIAGNOSIS — E669 Obesity, unspecified: Secondary | ICD-10-CM | POA: Diagnosis not present

## 2020-09-30 DIAGNOSIS — K219 Gastro-esophageal reflux disease without esophagitis: Secondary | ICD-10-CM | POA: Diagnosis not present

## 2020-09-30 DIAGNOSIS — E1122 Type 2 diabetes mellitus with diabetic chronic kidney disease: Secondary | ICD-10-CM | POA: Diagnosis not present

## 2020-09-30 DIAGNOSIS — Z7984 Long term (current) use of oral hypoglycemic drugs: Secondary | ICD-10-CM | POA: Diagnosis not present

## 2020-09-30 DIAGNOSIS — Z79891 Long term (current) use of opiate analgesic: Secondary | ICD-10-CM | POA: Diagnosis not present

## 2020-09-30 DIAGNOSIS — G8929 Other chronic pain: Secondary | ICD-10-CM | POA: Diagnosis not present

## 2020-09-30 DIAGNOSIS — E8809 Other disorders of plasma-protein metabolism, not elsewhere classified: Secondary | ICD-10-CM | POA: Diagnosis not present

## 2020-09-30 DIAGNOSIS — N183 Chronic kidney disease, stage 3 unspecified: Secondary | ICD-10-CM | POA: Diagnosis not present

## 2020-09-30 DIAGNOSIS — U071 COVID-19: Secondary | ICD-10-CM | POA: Diagnosis not present

## 2020-09-30 DIAGNOSIS — N3281 Overactive bladder: Secondary | ICD-10-CM | POA: Diagnosis not present

## 2020-09-30 DIAGNOSIS — N390 Urinary tract infection, site not specified: Secondary | ICD-10-CM | POA: Diagnosis not present

## 2020-09-30 DIAGNOSIS — E039 Hypothyroidism, unspecified: Secondary | ICD-10-CM | POA: Diagnosis not present

## 2020-09-30 DIAGNOSIS — M436 Torticollis: Secondary | ICD-10-CM | POA: Diagnosis not present

## 2020-09-30 DIAGNOSIS — E785 Hyperlipidemia, unspecified: Secondary | ICD-10-CM | POA: Diagnosis not present

## 2020-09-30 DIAGNOSIS — E46 Unspecified protein-calorie malnutrition: Secondary | ICD-10-CM | POA: Diagnosis not present

## 2020-09-30 DIAGNOSIS — M1990 Unspecified osteoarthritis, unspecified site: Secondary | ICD-10-CM | POA: Diagnosis not present

## 2020-09-30 DIAGNOSIS — I2510 Atherosclerotic heart disease of native coronary artery without angina pectoris: Secondary | ICD-10-CM | POA: Diagnosis not present

## 2020-09-30 DIAGNOSIS — C9000 Multiple myeloma not having achieved remission: Secondary | ICD-10-CM | POA: Diagnosis not present

## 2020-09-30 DIAGNOSIS — M542 Cervicalgia: Secondary | ICD-10-CM | POA: Diagnosis not present

## 2020-09-30 LAB — CULTURE, BLOOD (ROUTINE X 2)
Culture: NO GROWTH
Culture: NO GROWTH
Special Requests: ADEQUATE
Special Requests: ADEQUATE

## 2020-09-30 NOTE — Telephone Encounter (Signed)
Kim Mills with [facility] called she is on the way to see the patient and is requesting a call back from the nurse ph# [phone]

## 2020-10-01 DIAGNOSIS — B964 Proteus (mirabilis) (morganii) as the cause of diseases classified elsewhere: Secondary | ICD-10-CM | POA: Diagnosis not present

## 2020-10-01 DIAGNOSIS — E1122 Type 2 diabetes mellitus with diabetic chronic kidney disease: Secondary | ICD-10-CM | POA: Diagnosis not present

## 2020-10-01 DIAGNOSIS — I129 Hypertensive chronic kidney disease with stage 1 through stage 4 chronic kidney disease, or unspecified chronic kidney disease: Secondary | ICD-10-CM | POA: Diagnosis not present

## 2020-10-01 DIAGNOSIS — N390 Urinary tract infection, site not specified: Secondary | ICD-10-CM | POA: Diagnosis not present

## 2020-10-01 DIAGNOSIS — G9341 Metabolic encephalopathy: Secondary | ICD-10-CM | POA: Diagnosis not present

## 2020-10-01 DIAGNOSIS — N183 Chronic kidney disease, stage 3 unspecified: Secondary | ICD-10-CM | POA: Diagnosis not present

## 2020-10-02 NOTE — Telephone Encounter (Signed)
Left message

## 2020-10-03 DIAGNOSIS — E1122 Type 2 diabetes mellitus with diabetic chronic kidney disease: Secondary | ICD-10-CM | POA: Diagnosis not present

## 2020-10-03 DIAGNOSIS — I129 Hypertensive chronic kidney disease with stage 1 through stage 4 chronic kidney disease, or unspecified chronic kidney disease: Secondary | ICD-10-CM | POA: Diagnosis not present

## 2020-10-03 DIAGNOSIS — N390 Urinary tract infection, site not specified: Secondary | ICD-10-CM | POA: Diagnosis not present

## 2020-10-03 DIAGNOSIS — B964 Proteus (mirabilis) (morganii) as the cause of diseases classified elsewhere: Secondary | ICD-10-CM | POA: Diagnosis not present

## 2020-10-03 DIAGNOSIS — G9341 Metabolic encephalopathy: Secondary | ICD-10-CM | POA: Diagnosis not present

## 2020-10-03 DIAGNOSIS — N183 Chronic kidney disease, stage 3 unspecified: Secondary | ICD-10-CM | POA: Diagnosis not present

## 2020-10-07 DIAGNOSIS — B964 Proteus (mirabilis) (morganii) as the cause of diseases classified elsewhere: Secondary | ICD-10-CM | POA: Diagnosis not present

## 2020-10-07 DIAGNOSIS — E1122 Type 2 diabetes mellitus with diabetic chronic kidney disease: Secondary | ICD-10-CM | POA: Diagnosis not present

## 2020-10-07 DIAGNOSIS — G9341 Metabolic encephalopathy: Secondary | ICD-10-CM | POA: Diagnosis not present

## 2020-10-07 DIAGNOSIS — I129 Hypertensive chronic kidney disease with stage 1 through stage 4 chronic kidney disease, or unspecified chronic kidney disease: Secondary | ICD-10-CM | POA: Diagnosis not present

## 2020-10-07 DIAGNOSIS — N183 Chronic kidney disease, stage 3 unspecified: Secondary | ICD-10-CM | POA: Diagnosis not present

## 2020-10-07 DIAGNOSIS — N390 Urinary tract infection, site not specified: Secondary | ICD-10-CM | POA: Diagnosis not present

## 2020-10-08 ENCOUNTER — Other Ambulatory Visit (HOSPITAL_COMMUNITY): Payer: Self-pay

## 2020-10-08 DIAGNOSIS — G9341 Metabolic encephalopathy: Secondary | ICD-10-CM | POA: Diagnosis not present

## 2020-10-08 DIAGNOSIS — E1122 Type 2 diabetes mellitus with diabetic chronic kidney disease: Secondary | ICD-10-CM | POA: Diagnosis not present

## 2020-10-08 DIAGNOSIS — N183 Chronic kidney disease, stage 3 unspecified: Secondary | ICD-10-CM | POA: Diagnosis not present

## 2020-10-08 DIAGNOSIS — B964 Proteus (mirabilis) (morganii) as the cause of diseases classified elsewhere: Secondary | ICD-10-CM | POA: Diagnosis not present

## 2020-10-08 DIAGNOSIS — N390 Urinary tract infection, site not specified: Secondary | ICD-10-CM | POA: Diagnosis not present

## 2020-10-08 DIAGNOSIS — I129 Hypertensive chronic kidney disease with stage 1 through stage 4 chronic kidney disease, or unspecified chronic kidney disease: Secondary | ICD-10-CM | POA: Diagnosis not present

## 2020-10-08 MED ORDER — LENALIDOMIDE 10 MG PO CAPS: 10.0000 mg | ORAL_CAPSULE | Freq: Every day | ORAL | 0 refills | Status: DC

## 2020-10-08 NOTE — Telephone Encounter (Signed)
Chart reviewed. Revlimid refilled per last office visit with Dr. Delton Coombes

## 2020-10-09 DIAGNOSIS — E1122 Type 2 diabetes mellitus with diabetic chronic kidney disease: Secondary | ICD-10-CM | POA: Diagnosis not present

## 2020-10-09 DIAGNOSIS — B964 Proteus (mirabilis) (morganii) as the cause of diseases classified elsewhere: Secondary | ICD-10-CM | POA: Diagnosis not present

## 2020-10-09 DIAGNOSIS — G9341 Metabolic encephalopathy: Secondary | ICD-10-CM | POA: Diagnosis not present

## 2020-10-09 DIAGNOSIS — N183 Chronic kidney disease, stage 3 unspecified: Secondary | ICD-10-CM | POA: Diagnosis not present

## 2020-10-09 DIAGNOSIS — I129 Hypertensive chronic kidney disease with stage 1 through stage 4 chronic kidney disease, or unspecified chronic kidney disease: Secondary | ICD-10-CM | POA: Diagnosis not present

## 2020-10-09 DIAGNOSIS — N390 Urinary tract infection, site not specified: Secondary | ICD-10-CM | POA: Diagnosis not present

## 2020-10-09 NOTE — Telephone Encounter (Signed)
No answer, left message for Cassandra to call our office.

## 2020-10-10 DIAGNOSIS — N183 Chronic kidney disease, stage 3 unspecified: Secondary | ICD-10-CM | POA: Diagnosis not present

## 2020-10-10 DIAGNOSIS — I129 Hypertensive chronic kidney disease with stage 1 through stage 4 chronic kidney disease, or unspecified chronic kidney disease: Secondary | ICD-10-CM | POA: Diagnosis not present

## 2020-10-10 DIAGNOSIS — E1122 Type 2 diabetes mellitus with diabetic chronic kidney disease: Secondary | ICD-10-CM | POA: Diagnosis not present

## 2020-10-10 DIAGNOSIS — B964 Proteus (mirabilis) (morganii) as the cause of diseases classified elsewhere: Secondary | ICD-10-CM | POA: Diagnosis not present

## 2020-10-10 DIAGNOSIS — N390 Urinary tract infection, site not specified: Secondary | ICD-10-CM | POA: Diagnosis not present

## 2020-10-10 DIAGNOSIS — G9341 Metabolic encephalopathy: Secondary | ICD-10-CM | POA: Diagnosis not present

## 2020-10-12 NOTE — Progress Notes (Signed)
[place], [place] [address]   CLINIC:  Medical Oncology/Hematology  PCP:  Lindell Spar, MD [address] / [place] [address] [phone]   REASON FOR VISIT:  Follow-up for multiple myeloma & normocytic anemia  PRIOR THERAPY:   Velcade x 9 cycles from 10/29/2019 to 05/02/2020 Revlimid 2/3 weeks  NGS Results: not done  CURRENT THERAPY: surveillance  BRIEF ONCOLOGIC HISTORY:  Oncology History  Multiple myeloma not having achieved remission ([facility])  10/13/2019 Initial Diagnosis   Multiple myeloma not having achieved remission ([facility])   10/29/2019 -  Chemotherapy    Patient is on Treatment Plan: MYELOMA NON-TRANSPLANT CANDIDATES VRD WEEKLY Q21D         CANCER STAGING: Cancer Staging No matching staging information was found for the patient.  INTERVAL HISTORY:  Ms. JAMIRRA CURNOW, a 80 y.o. female, returns for routine follow-up of her  multiple myeloma & normocytic anemia. Marlise was last seen on 09/15/2020.   Today she reports feeling fair. She reports severe fatigue as well as pain on her left side from her toes extending to her head; the pain is worst in her knees. She is taking hydrocodone which provides temporarily relief for this pain. She has lost 25 pounds since May. She is taking 1,200 mg of Calcium daily.   REVIEW OF SYSTEMS:  Review of Systems  Constitutional:  Positive for fatigue (50%) and unexpected weight change (-25 lbs). Negative for appetite change (70%).  Genitourinary:  Positive for difficulty urinating (odor).   Neurological:  Positive for dizziness.  Psychiatric/Behavioral:  Positive for depression.   All other systems reviewed and are negative.  PAST MEDICAL/SURGICAL HISTORY:  Past Medical History:  Diagnosis Date   Arthritis    Cancer [facility])    Cataract    Chest pain    Associated with weakness and fatigue   Diabetes mellitus    A1c of 7.4 in 08/2010   GERD (gastroesophageal reflux disease)     Glaucoma    Hyperlipidemia    Lipid profile in 08/2010:190, 121, 48, 118; normal CBC and CMet   Hypertension    Lipid profile in 08/2010:190, 121, 48, 118; normal CBC and CMet   Hypothyroidism    Hypothyroidism    Obesity    Past Surgical History:  Procedure Laterality Date   CHOLECYSTECTOMY     COLONOSCOPY N/A 04/01/2014   RMR: Melanosis coli. colonic polyps removed as described above.    CYSTOSCOPY W/ URETERAL STENT PLACEMENT Right 09/13/2019   Procedure: CYSTOSCOPY WITH RIGHT  RETROGRADE PYELOGRAM; BLADDER BIOPSY;  Surgeon: Cleon Gustin, MD;  Location: [facility];  Service: Urology;  Laterality: Right;   LEFT HEART CATH AND CORONARY ANGIOGRAPHY N/A 10/07/2017   Procedure: LEFT HEART CATH AND CORONARY ANGIOGRAPHY;  Surgeon: Troy Sine, MD;  Location: [facility];  Service: Cardiovascular;  Laterality: N/A;   THYROIDECTOMY, PARTIAL      SOCIAL HISTORY:  Social History   Socioeconomic History   Marital status: Widowed    Spouse name: Not on file   Number of children: 7   Years of education: Not on file   Highest education level: Not on file  Occupational History    Employer: RETIRED  Tobacco Use   Smoking status: Never    Passive exposure: Yes   Smokeless tobacco: Never  Vaping Use   Vaping Use: Never used  Substance and Sexual Activity   Alcohol use: Never   Drug use:  Never   Sexual activity: Not Currently  Other Topics Concern   Not on file  Social History Narrative   Not on file   Social Determinants of Health   Financial Resource Strain: Not on file  Food Insecurity: Not on file  Transportation Needs: No Transportation Needs   Lack of Transportation (Medical): No   Lack of Transportation (Non-Medical): No  Physical Activity: Inactive   Days of Exercise per Week: 0 days   Minutes of Exercise per Session: 0 min  Stress: Not on file  Social Connections: Not on file  Intimate Partner Violence: Not At Risk   Fear of Current or Ex-Partner: No    Emotionally Abused: No   Physically Abused: No   Sexually Abused: No    FAMILY HISTORY:  Family History  Problem Relation Age of Onset   Anemia Father    Arthritis Mother    Stroke Brother    Cancer Sister        unknown kind   Diabetes Sister    Dementia Sister    Cancer Niece        Breast   Cancer Brother    Healthy Son    Healthy Son    Healthy Son    Healthy Daughter    Healthy Daughter    Healthy Daughter    Sarcoidosis Daughter     CURRENT MEDICATIONS:  Current Outpatient Medications  Medication Sig Dispense Refill   acyclovir (ZOVIRAX) 400 MG tablet Take 1 tablet (400 mg total) by mouth 2 (two) times daily. 60 tablet 6   ASPIRIN 81 PO Take 1 tablet by mouth daily.     Blood Glucose Monitoring Suppl (BLOOD GLUCOSE SYSTEM PAK) KIT Please dispense based on patient and insurance preference. Use as directed to monitor FSBS 2x daily. Dx: E11.9. 1 each 1   Calcium 500 MG tablet Take 600 mg by mouth 2 (two) times daily.     docusate sodium (COLACE) 100 MG capsule Take 1 capsule (100 mg total) by mouth 2 (two) times daily. 60 capsule 2   fluticasone (FLONASE) 50 MCG/ACT nasal spray USE TWO SPRAYS IN BOTH NOSTRILS DAILY (Patient taking differently: Place 2 sprays into both nostrils daily.) 16 g 6   gabapentin (NEURONTIN) 300 MG capsule TAKE ONE CAPSULE BY MOUTH EVERY NIGHT AT BEDTIME 90 capsule 3   glipiZIDE (GLUCOTROL) 5 MG tablet TAKE ONE TABLET (5MG TOTAL) BY MOUTH TWOTIMES DAILY (Patient taking differently: Take 5 mg by mouth 2 (two) times daily before a meal.) 180 tablet 3   Glucose Blood (BLOOD GLUCOSE TEST STRIPS) STRP Please dispense based on patient and insurance preference. Use as directed to monitor FSBS 2x daily. Dx: E11.9. 100 each 11   HM LORATADINE 10 MG tablet TAKE ONE TABLET (10MG TOTAL) BY MOUTH DAILY (Patient taking differently: Take 10 mg by mouth daily as needed for allergies.) 30 tablet 0   Lancets MISC Please dispense based on patient and insurance  preference. Use as directed to monitor FSBS 2x daily. Dx: E11.9. 100 each 11   lenalidomide (REVLIMID) 10 MG capsule Take 1 capsule (10 mg total) by mouth daily. 14 days on, 7 days off 14 capsule 0   magic mouthwash w/lidocaine SOLN Take 5 mLs by mouth 4 (four) times daily. 450 mL 0   magnesium oxide (MAG-OX) 400 (240 Mg) MG tablet TAKE ONE TABLET (400MG TOTAL) BY MOUTH TWO TIMES DAILY (Patient taking differently: Take 400 mg by mouth 2 (two) times daily.) 120 tablet 1  meclizine (ANTIVERT) 25 MG tablet Take 25 mg by mouth every 6 (six) hours as needed for dizziness.      metFORMIN (GLUCOPHAGE) 1000 MG tablet TAKE ONE TABLET BY MOUTH TWICE A DAY 60 tablet 3   metoprolol tartrate (LOPRESSOR) 25 MG tablet TAKE ONE-HALF TABLET (12.5MG TOTAL) BY MOUTH TWO TIMES DAILY (Patient taking differently: Take 12.5 mg by mouth 2 (two) times daily.) 90 tablet 3   omeprazole (PRILOSEC) 40 MG capsule TAKE ONE CAPSULE BY MOUTH DAILY 90 capsule 3   polyethylene glycol (MIRALAX / GLYCOLAX) 17 g packet Take 17 g by mouth daily. 14 each 0   potassium chloride SA (KLOR-CON) 20 MEQ tablet Take 1 tablet (20 mEq total) by mouth 2 (two) times daily. 10 tablet 0   simvastatin (ZOCOR) 40 MG tablet TAKE ONE TABLET (40MG TOTAL) BY MOUTH BEDTIME (Patient taking differently: Take 40 mg by mouth at bedtime.) 90 tablet 3   SYNTHROID 137 MCG tablet TAKE ONE TABLET (137MCG TOTAL) BY MOUTH DAILY BEFORE BREAKFAST (Patient taking differently: Take 137 mcg by mouth daily before breakfast.) 30 tablet 0   traMADol (ULTRAM) 50 MG tablet TAKE ONE TABLET (50MG TOTAL) BY MOUTH TWO TIMES DAILY AS NEEDED (Patient taking differently: Take 50 mg by mouth 2 (two) times daily as needed for moderate pain.) 60 tablet 0   zinc oxide (MEIJER ZINC OXIDE) 20 % ointment Apply 1 application topically as needed for irritation. 56.7 g 0   HYDROcodone-acetaminophen (NORCO/VICODIN) 5-325 MG tablet Take 1 tablet by mouth every 6 (six) hours as needed for moderate  pain or severe pain. 60 tablet 0   No current facility-administered medications for this visit.    ALLERGIES:  No Known Allergies  PHYSICAL EXAM:  Performance status (ECOG): 3 - Symptomatic, >50% confined to bed  Vitals:   10/14/20 1427  BP: (!) 101/49  Pulse: 60  Resp: 18  Temp: 99.5 F (37.5 C)  SpO2: 100%   Wt Readings from Last 3 Encounters:  10/14/20 156 lb 1.6 oz (70.8 kg)  09/29/20 163 lb 2.3 oz (74 kg)  09/23/20 160 lb (72.6 kg)   Physical Exam Vitals reviewed.  Constitutional:      Appearance: Normal appearance.     Comments: In wheelchair  Cardiovascular:     Rate and Rhythm: Normal rate and regular rhythm.     Pulses: Normal pulses.     Heart sounds: Normal heart sounds.  Pulmonary:     Effort: Pulmonary effort is normal.     Breath sounds: Normal breath sounds.  Neurological:     General: No focal deficit present.     Mental Status: She is alert and oriented to person, place, and time.  Psychiatric:        Mood and Affect: Mood normal.        Behavior: Behavior normal.     LABORATORY DATA:  I have reviewed the labs as listed.  CBC Latest Ref Rng & Units 10/14/2020 09/26/2020 09/25/2020  WBC 4.0 - 10.5 K/uL 7.0 8.2 9.1  Hemoglobin 12.0 - 15.0 g/dL 9.9(L) 9.9(L) 11.0(L)  Hematocrit 36.0 - 46.0 % 31.9(L) 32.1(L) 36.6  Platelets 150 - 400 K/uL 221 127(L) 161   CMP Latest Ref Rng & Units 09/29/2020 09/28/2020 09/27/2020  Glucose 70 - 99 mg/dL 123(H) 103(H) 139(H)  BUN 8 - 23 mg/dL 10 13 21   Creatinine 0.44 - 1.00 mg/dL 0.82 0.89 1.13(H)  Sodium 135 - 145 mmol/L 136 137 138  Potassium 3.5 - 5.1 mmol/L  3.9 3.9 3.4(L)  Chloride 98 - 111 mmol/L 108 104 107  CO2 22 - 32 mmol/L 22 24 24   Calcium 8.9 - 10.3 mg/dL 7.1(L) 6.9(L) 6.5(L)  Total Protein 6.5 - 8.1 g/dL 5.4(L) 5.8(L) 5.9(L)  Total Bilirubin 0.3 - 1.2 mg/dL 0.7 0.9 0.8  Alkaline Phos 38 - 126 U/L 50 61 62  AST 15 - 41 U/L 36 36 51(H)  ALT 0 - 44 U/L 19 16 19     DIAGNOSTIC IMAGING:  I have  independently reviewed the scans and discussed with the patient. DG Ankle Complete Right  Result Date: 09/29/2020 X-rays of the right ankle were obtained in clinic today and compared to previous x-rays.  There is been no interval displacement of the distal fibula fracture.  Mortise remains intact.  No syndesmotic disruption.  Interval consolidation at the fracture site.   Impression: Healing right distal fibula fracture  DG CHEST PORT 1 VIEW  Result Date: 09/26/2020 CLINICAL DATA:  Positive COVID-19.  Weakness and fever. EXAM: PORTABLE CHEST 1 VIEW COMPARISON:  Chest radiograph dated 03/26/2020. FINDINGS: Background of emphysema. No focal consolidation, pleural effusion, or pneumothorax. Stable cardiac silhouette. Atherosclerotic calcification of the aorta. Osteopenia with degenerative changes of the spine. No acute osseous pathology. IMPRESSION: 1. No acute cardiopulmonary process. 2. Emphysema. Electronically Signed   By: Anner Crete M.D.   On: 09/26/2020 00:40     ASSESSMENT:  1.  Normocytic anemia: -Patient seen at the request of Dr. Buelah Manis for further work-up and management of normocytic anemia. -Recent CBC on 08/21/2019 shows hemoglobin 6.9 with MCV of 88.3.  White count and platelets are normal.  No history of CKD.  Denies any bleeding per rectum or melena. Last colonoscopy on 04/01/2014 shows diffusely pigmented rectal mucosa consistent with melanosis [name]  Diffusely pigmented colonic mucosa, 2 diminutive polyps in the mid ascending segment, otherwise the remainder of the colon mucosa was normal.  Biopsy consistent with tubular adenoma. -CT renal study on 05/11/2019 shows normal-sized spleen with normal liver.  No other abnormal adenopathy. -Denies any prior history of blood transfusion.  She is currently taking iron tablet twice daily.   2.  IgG lambda plasma cell myeloma: -Skeletal survey on 09/12/2019 shows diffuse faint lucencies in the skull, bilateral  femurs. -Bone marrow biopsy on 09/27/2019 shows 70% plasma cells in hypercellular marrow.  Chromosome analysis was 52, XX. FISH panel was normal.  However quality of specimen is compromised. -LDH normal, beta-2 microglobulin 2.9.  M spike was 2.1 g.  Kappa light chains 19.7, lambda light chains 14.3 with ratio of 1.38. -24-hour urine shows nonnephrotic range proteinuria.  Urine immunofixation was positive.   PLAN:  1.  IgG lambda plasma cell myeloma: - She is continuing Revlimid 10 mg 2 weeks on/1 week off. - Recent hospitalization and discharge on 09/29/2020 for UTI. - She complains of feeling weak.  She is doing physical therapy twice weekly. - Reviewed myeloma panel from 09/15/2020.  SPEP shows M spike is 0.2 g and stable.  Immunofixation was positive.  Free light chain ratio was normal.  Lambda light chains are slightly elevated at 29.1. - We will continue same management with Revlimid 10 mg 2 weeks on/1 week off. - We have sent myeloma panel from today.  If they continue to go high, will consider adding dexamethasone weekly to the current regimen. - RTC 4 weeks with labs.   2.  Diabetes: - Continue glipizide and metformin.   3.  Hypertension: - Continue  amlodipine and HCTZ.   4.  Weight loss: - She lost about 25 pounds since May. - Her appetite is on and off present. - Recommend adding 1 can of Glucerna to what she is eating daily. - Reevaluate in 4 weeks.  If there is no improvement we will consider appetite stimulant.   5.  Diarrhea: - This has improved since Revlimid dose was decreased. - Continue Imodium as needed.   6.  Bilateral knee pains: - She is taking hydrocodone 5/325 once daily which is not helping completely. - Will increase hydrocodone 5/325 to twice daily.  New prescription sent today.   7.  Infection prophylaxis: - Continue acyclovir 400 mg twice daily.  Continue aspirin 81 mg daily.   8.  Bone protection: - Calcium today 7.4 albumin 3.3. - Corrected calcium  is 8.0.  She is taking calcium 1200 mg daily. - Will increase calcium to 1800 mg daily.  We will hold her injection today and reevaluate in 4 weeks.  9.  Hypokalemia: - Potassium today is 3.6.  Continue home potassium.   Orders placed this encounter:  No orders of the defined types were placed in this encounter.    Derek Jack, MD [place] [phone]   I, Thana Ates, am acting as a scribe for Dr. Derek Jack.  I, Derek Jack MD, have reviewed the above documentation for accuracy and completeness, and I agree with the above.

## 2020-10-14 ENCOUNTER — Inpatient Hospital Stay (HOSPITAL_COMMUNITY): Payer: Medicare Other

## 2020-10-14 ENCOUNTER — Inpatient Hospital Stay (HOSPITAL_COMMUNITY): Payer: Medicare Other | Attending: Hematology

## 2020-10-14 ENCOUNTER — Other Ambulatory Visit: Payer: Self-pay

## 2020-10-14 ENCOUNTER — Inpatient Hospital Stay (HOSPITAL_BASED_OUTPATIENT_CLINIC_OR_DEPARTMENT_OTHER): Payer: Medicare Other | Admitting: Hematology

## 2020-10-14 VITALS — BP 101/49 | HR 60 | Temp 99.5°F | Resp 18 | Wt 156.1 lb

## 2020-10-14 DIAGNOSIS — Z79899 Other long term (current) drug therapy: Secondary | ICD-10-CM | POA: Diagnosis not present

## 2020-10-14 DIAGNOSIS — C9000 Multiple myeloma not having achieved remission: Secondary | ICD-10-CM | POA: Insufficient documentation

## 2020-10-14 DIAGNOSIS — M25561 Pain in right knee: Secondary | ICD-10-CM | POA: Diagnosis not present

## 2020-10-14 DIAGNOSIS — I10 Essential (primary) hypertension: Secondary | ICD-10-CM | POA: Insufficient documentation

## 2020-10-14 DIAGNOSIS — E119 Type 2 diabetes mellitus without complications: Secondary | ICD-10-CM | POA: Insufficient documentation

## 2020-10-14 DIAGNOSIS — D649 Anemia, unspecified: Secondary | ICD-10-CM

## 2020-10-14 DIAGNOSIS — Z7984 Long term (current) use of oral hypoglycemic drugs: Secondary | ICD-10-CM | POA: Diagnosis not present

## 2020-10-14 DIAGNOSIS — E876 Hypokalemia: Secondary | ICD-10-CM | POA: Diagnosis not present

## 2020-10-14 DIAGNOSIS — M25562 Pain in left knee: Secondary | ICD-10-CM | POA: Insufficient documentation

## 2020-10-14 DIAGNOSIS — G9341 Metabolic encephalopathy: Secondary | ICD-10-CM | POA: Diagnosis not present

## 2020-10-14 DIAGNOSIS — M542 Cervicalgia: Secondary | ICD-10-CM

## 2020-10-14 DIAGNOSIS — I129 Hypertensive chronic kidney disease with stage 1 through stage 4 chronic kidney disease, or unspecified chronic kidney disease: Secondary | ICD-10-CM | POA: Diagnosis not present

## 2020-10-14 DIAGNOSIS — E1122 Type 2 diabetes mellitus with diabetic chronic kidney disease: Secondary | ICD-10-CM | POA: Diagnosis not present

## 2020-10-14 DIAGNOSIS — Z7982 Long term (current) use of aspirin: Secondary | ICD-10-CM | POA: Diagnosis not present

## 2020-10-14 DIAGNOSIS — N390 Urinary tract infection, site not specified: Secondary | ICD-10-CM | POA: Diagnosis not present

## 2020-10-14 DIAGNOSIS — B964 Proteus (mirabilis) (morganii) as the cause of diseases classified elsewhere: Secondary | ICD-10-CM | POA: Diagnosis not present

## 2020-10-14 DIAGNOSIS — N183 Chronic kidney disease, stage 3 unspecified: Secondary | ICD-10-CM | POA: Diagnosis not present

## 2020-10-14 LAB — COMPREHENSIVE METABOLIC PANEL
ALT: 15 U/L (ref 0–44)
AST: 16 U/L (ref 15–41)
Albumin: 3.3 g/dL — ABNORMAL LOW (ref 3.5–5.0)
Alkaline Phosphatase: 54 U/L (ref 38–126)
Anion gap: 9 (ref 5–15)
BUN: 23 mg/dL (ref 8–23)
CO2: 26 mmol/L (ref 22–32)
Calcium: 7.4 mg/dL — ABNORMAL LOW (ref 8.9–10.3)
Chloride: 105 mmol/L (ref 98–111)
Creatinine, Ser: 1.08 mg/dL — ABNORMAL HIGH (ref 0.44–1.00)
GFR, Estimated: 53 mL/min — ABNORMAL LOW (ref >60)
Glucose, Bld: 104 mg/dL — ABNORMAL HIGH (ref 70–99)
Potassium: 3.6 mmol/L (ref 3.5–5.1)
Sodium: 140 mmol/L (ref 135–145)
Total Bilirubin: 0.8 mg/dL (ref 0.3–1.2)
Total Protein: 6.5 g/dL (ref 6.5–8.1)

## 2020-10-14 LAB — CBC WITH DIFFERENTIAL/PLATELET
Abs Immature Granulocytes: 0.05 10*3/uL (ref 0.00–0.07)
Basophils Absolute: 0.1 10*3/uL (ref 0.0–0.1)
Basophils Relative: 1 %
Eosinophils Absolute: 0.4 10*3/uL (ref 0.0–0.5)
Eosinophils Relative: 6 %
HCT: 31.9 % — ABNORMAL LOW (ref 36.0–46.0)
Hemoglobin: 9.9 g/dL — ABNORMAL LOW (ref 12.0–15.0)
Immature Granulocytes: 1 %
Lymphocytes Relative: 38 %
Lymphs Abs: 2.6 10*3/uL (ref 0.7–4.0)
MCH: 28.3 pg (ref 26.0–34.0)
MCHC: 31 g/dL (ref 30.0–36.0)
MCV: 91.1 fL (ref 80.0–100.0)
Monocytes Absolute: 0.8 10*3/uL (ref 0.1–1.0)
Monocytes Relative: 11 %
Neutro Abs: 3 10*3/uL (ref 1.7–7.7)
Neutrophils Relative %: 43 %
Platelets: 221 10*3/uL (ref 150–400)
RBC: 3.5 MIL/uL — ABNORMAL LOW (ref 3.87–5.11)
RDW: 18 % — ABNORMAL HIGH (ref 11.5–15.5)
WBC: 7 10*3/uL (ref 4.0–10.5)
nRBC: 0 % (ref 0.0–0.2)

## 2020-10-14 LAB — LACTATE DEHYDROGENASE: LDH: 152 U/L (ref 98–192)

## 2020-10-14 MED ORDER — HYDROCODONE-ACETAMINOPHEN 5-325 MG PO TABS: 1.0000 | ORAL_TABLET | Freq: Four times a day (QID) | ORAL | 0 refills | Status: DC | PRN

## 2020-10-14 NOTE — Patient Instructions (Addendum)
[place] at [facility] Discharge Instructions  You were seen today by Dr. Delton Coombes. He went over your recent results. Start drinking 1 bottle of Glucerna daily to increase your daily calorie intake. Dr. Delton Coombes will see you back in 1 month for labs and follow up.   Thank you for choosing [place] at [facility] to provide your oncology and hematology care.  To afford each patient quality time with our provider, please arrive at least 15 minutes before your scheduled appointment time.   If you have a lab appointment with the [facility] please come in thru the Main Entrance and check in at the main information desk  You need to re-schedule your appointment should you arrive 10 or more minutes late.  We strive to give you quality time with our providers, and arriving late affects you and other patients whose appointments are after yours.  Also, if you no show three or more times for appointments you may be dismissed from the clinic at the providers discretion.     Again, thank you for choosing [facility].  Our hope is that these requests will decrease the amount of time that you wait before being seen by our physicians.       _____________________________________________________________  Should you have questions after your visit to [facility], please contact our office at [phone] between the hours of 8:00 a.m. and 4:30 p.m.  Voicemails left after 4:00 p.m. will not be returned until the following business day.  For prescription refill requests, have your pharmacy contact our office and allow 72 hours.    Cancer Center Support Programs:   > Cancer Support Group  2nd Tuesday of the month 1pm-2pm, Journey Room

## 2020-10-14 NOTE — Progress Notes (Signed)
Pt presents today for Xgeva calcium was 7.4. Dr. Raliegh Ip made and advise pt to take 1 more tablet, total of 3 tablets daily.  Hold her injection today.  We will check her in a month. Pt and daughter made aware and verbalized understanding.

## 2020-10-15 ENCOUNTER — Telehealth: Payer: Self-pay

## 2020-10-15 DIAGNOSIS — G9341 Metabolic encephalopathy: Secondary | ICD-10-CM | POA: Diagnosis not present

## 2020-10-15 DIAGNOSIS — N183 Chronic kidney disease, stage 3 unspecified: Secondary | ICD-10-CM | POA: Diagnosis not present

## 2020-10-15 DIAGNOSIS — N390 Urinary tract infection, site not specified: Secondary | ICD-10-CM | POA: Diagnosis not present

## 2020-10-15 DIAGNOSIS — I129 Hypertensive chronic kidney disease with stage 1 through stage 4 chronic kidney disease, or unspecified chronic kidney disease: Secondary | ICD-10-CM | POA: Diagnosis not present

## 2020-10-15 DIAGNOSIS — E1122 Type 2 diabetes mellitus with diabetic chronic kidney disease: Secondary | ICD-10-CM | POA: Diagnosis not present

## 2020-10-15 DIAGNOSIS — B964 Proteus (mirabilis) (morganii) as the cause of diseases classified elsewhere: Secondary | ICD-10-CM | POA: Diagnosis not present

## 2020-10-15 LAB — KAPPA/LAMBDA LIGHT CHAINS
Kappa free light chain: 68.8 mg/L — ABNORMAL HIGH (ref 3.3–19.4)
Kappa, lambda light chain ratio: 1.4 (ref 0.26–1.65)
Lambda free light chains: 49 mg/L — ABNORMAL HIGH (ref 5.7–26.3)

## 2020-10-15 NOTE — Telephone Encounter (Signed)
Cassandra from [facility] called patient needs glucose monitor and supplies. Call back # [phone]  Pharmacy: [facility]

## 2020-10-16 DIAGNOSIS — N390 Urinary tract infection, site not specified: Secondary | ICD-10-CM | POA: Diagnosis not present

## 2020-10-16 DIAGNOSIS — I129 Hypertensive chronic kidney disease with stage 1 through stage 4 chronic kidney disease, or unspecified chronic kidney disease: Secondary | ICD-10-CM | POA: Diagnosis not present

## 2020-10-16 DIAGNOSIS — N183 Chronic kidney disease, stage 3 unspecified: Secondary | ICD-10-CM | POA: Diagnosis not present

## 2020-10-16 DIAGNOSIS — B964 Proteus (mirabilis) (morganii) as the cause of diseases classified elsewhere: Secondary | ICD-10-CM | POA: Diagnosis not present

## 2020-10-16 DIAGNOSIS — G9341 Metabolic encephalopathy: Secondary | ICD-10-CM | POA: Diagnosis not present

## 2020-10-16 DIAGNOSIS — E1122 Type 2 diabetes mellitus with diabetic chronic kidney disease: Secondary | ICD-10-CM | POA: Diagnosis not present

## 2020-10-16 LAB — IMMUNOFIXATION ELECTROPHORESIS
IgA: 483 mg/dL — ABNORMAL HIGH (ref 64–422)
IgG (Immunoglobin G), Serum: 878 mg/dL (ref 586–1602)
IgM (Immunoglobulin M), Srm: 23 mg/dL — ABNORMAL LOW (ref 26–217)
Total Protein ELP: 5.9 g/dL — ABNORMAL LOW (ref 6.0–8.5)

## 2020-10-16 LAB — PROTEIN ELECTROPHORESIS, SERUM
A/G Ratio: 1.1 (ref 0.7–1.7)
Albumin ELP: 3.1 g/dL (ref 2.9–4.4)
Alpha-1-Globulin: 0.3 g/dL (ref 0.0–0.4)
Alpha-2-Globulin: 0.7 g/dL (ref 0.4–1.0)
Beta Globulin: 1.2 g/dL (ref 0.7–1.3)
Gamma Globulin: 0.8 g/dL (ref 0.4–1.8)
Globulin, Total: 2.9 g/dL (ref 2.2–3.9)
Total Protein ELP: 6 g/dL (ref 6.0–8.5)

## 2020-10-17 DIAGNOSIS — I129 Hypertensive chronic kidney disease with stage 1 through stage 4 chronic kidney disease, or unspecified chronic kidney disease: Secondary | ICD-10-CM | POA: Diagnosis not present

## 2020-10-17 DIAGNOSIS — G9341 Metabolic encephalopathy: Secondary | ICD-10-CM | POA: Diagnosis not present

## 2020-10-17 DIAGNOSIS — N183 Chronic kidney disease, stage 3 unspecified: Secondary | ICD-10-CM | POA: Diagnosis not present

## 2020-10-17 DIAGNOSIS — B964 Proteus (mirabilis) (morganii) as the cause of diseases classified elsewhere: Secondary | ICD-10-CM | POA: Diagnosis not present

## 2020-10-17 DIAGNOSIS — E1122 Type 2 diabetes mellitus with diabetic chronic kidney disease: Secondary | ICD-10-CM | POA: Diagnosis not present

## 2020-10-17 DIAGNOSIS — N390 Urinary tract infection, site not specified: Secondary | ICD-10-CM | POA: Diagnosis not present

## 2020-10-20 ENCOUNTER — Other Ambulatory Visit: Payer: Self-pay | Admitting: *Deleted

## 2020-10-20 DIAGNOSIS — N183 Chronic kidney disease, stage 3 unspecified: Secondary | ICD-10-CM | POA: Diagnosis not present

## 2020-10-20 DIAGNOSIS — B964 Proteus (mirabilis) (morganii) as the cause of diseases classified elsewhere: Secondary | ICD-10-CM | POA: Diagnosis not present

## 2020-10-20 DIAGNOSIS — G9341 Metabolic encephalopathy: Secondary | ICD-10-CM | POA: Diagnosis not present

## 2020-10-20 DIAGNOSIS — E1122 Type 2 diabetes mellitus with diabetic chronic kidney disease: Secondary | ICD-10-CM | POA: Diagnosis not present

## 2020-10-20 DIAGNOSIS — N390 Urinary tract infection, site not specified: Secondary | ICD-10-CM | POA: Diagnosis not present

## 2020-10-20 DIAGNOSIS — I129 Hypertensive chronic kidney disease with stage 1 through stage 4 chronic kidney disease, or unspecified chronic kidney disease: Secondary | ICD-10-CM | POA: Diagnosis not present

## 2020-10-20 MED ORDER — BLOOD GLUCOSE METER KIT: PACK | 0 refills | Status: DC

## 2020-10-20 NOTE — Telephone Encounter (Signed)
Kit and supplies sent to [facility]

## 2020-10-21 DIAGNOSIS — G9341 Metabolic encephalopathy: Secondary | ICD-10-CM | POA: Diagnosis not present

## 2020-10-21 DIAGNOSIS — E1122 Type 2 diabetes mellitus with diabetic chronic kidney disease: Secondary | ICD-10-CM | POA: Diagnosis not present

## 2020-10-21 DIAGNOSIS — N183 Chronic kidney disease, stage 3 unspecified: Secondary | ICD-10-CM | POA: Diagnosis not present

## 2020-10-21 DIAGNOSIS — I129 Hypertensive chronic kidney disease with stage 1 through stage 4 chronic kidney disease, or unspecified chronic kidney disease: Secondary | ICD-10-CM | POA: Diagnosis not present

## 2020-10-21 DIAGNOSIS — N390 Urinary tract infection, site not specified: Secondary | ICD-10-CM | POA: Diagnosis not present

## 2020-10-21 DIAGNOSIS — B964 Proteus (mirabilis) (morganii) as the cause of diseases classified elsewhere: Secondary | ICD-10-CM | POA: Diagnosis not present

## 2020-10-22 ENCOUNTER — Telehealth: Payer: Self-pay | Admitting: Internal Medicine

## 2020-10-22 ENCOUNTER — Telehealth: Payer: Self-pay

## 2020-10-22 DIAGNOSIS — I129 Hypertensive chronic kidney disease with stage 1 through stage 4 chronic kidney disease, or unspecified chronic kidney disease: Secondary | ICD-10-CM | POA: Diagnosis not present

## 2020-10-22 DIAGNOSIS — E1122 Type 2 diabetes mellitus with diabetic chronic kidney disease: Secondary | ICD-10-CM | POA: Diagnosis not present

## 2020-10-22 DIAGNOSIS — G9341 Metabolic encephalopathy: Secondary | ICD-10-CM | POA: Diagnosis not present

## 2020-10-22 DIAGNOSIS — N390 Urinary tract infection, site not specified: Secondary | ICD-10-CM | POA: Diagnosis not present

## 2020-10-22 DIAGNOSIS — N183 Chronic kidney disease, stage 3 unspecified: Secondary | ICD-10-CM | POA: Diagnosis not present

## 2020-10-22 DIAGNOSIS — B964 Proteus (mirabilis) (morganii) as the cause of diseases classified elsewhere: Secondary | ICD-10-CM | POA: Diagnosis not present

## 2020-10-22 NOTE — Telephone Encounter (Signed)
Patient called she is needing testing supplies and meter sent to [facility] due to insurance ph# [phone]

## 2020-10-22 NOTE — Telephone Encounter (Signed)
This was sent in 10-20-20 if she is having problems with the pharmacy not receiving let her know she can come by a pick up prescription and take to pharmacy

## 2020-10-22 NOTE — Telephone Encounter (Signed)
  Left message for patient to call back and schedule Medicare Annual Wellness Visit (AWV) in office.   If unable to come into the office for AWV,  please offer to do virtually or by telephone.  No hx of AWV eligible for AWVI as of 01/08/2018  Please schedule at anytime with [place].      40 Minutes appointment   Any questions, please call me at [phone]

## 2020-10-23 ENCOUNTER — Other Ambulatory Visit: Payer: Self-pay | Admitting: *Deleted

## 2020-10-23 DIAGNOSIS — E785 Hyperlipidemia, unspecified: Secondary | ICD-10-CM | POA: Diagnosis not present

## 2020-10-23 DIAGNOSIS — E1122 Type 2 diabetes mellitus with diabetic chronic kidney disease: Secondary | ICD-10-CM | POA: Diagnosis not present

## 2020-10-23 DIAGNOSIS — U071 COVID-19: Secondary | ICD-10-CM | POA: Diagnosis not present

## 2020-10-23 DIAGNOSIS — E8809 Other disorders of plasma-protein metabolism, not elsewhere classified: Secondary | ICD-10-CM

## 2020-10-23 DIAGNOSIS — I129 Hypertensive chronic kidney disease with stage 1 through stage 4 chronic kidney disease, or unspecified chronic kidney disease: Secondary | ICD-10-CM | POA: Diagnosis not present

## 2020-10-23 DIAGNOSIS — C9000 Multiple myeloma not having achieved remission: Secondary | ICD-10-CM | POA: Diagnosis not present

## 2020-10-23 DIAGNOSIS — E039 Hypothyroidism, unspecified: Secondary | ICD-10-CM | POA: Diagnosis not present

## 2020-10-23 DIAGNOSIS — G9341 Metabolic encephalopathy: Secondary | ICD-10-CM | POA: Diagnosis not present

## 2020-10-23 DIAGNOSIS — M436 Torticollis: Secondary | ICD-10-CM

## 2020-10-23 DIAGNOSIS — K219 Gastro-esophageal reflux disease without esophagitis: Secondary | ICD-10-CM

## 2020-10-23 DIAGNOSIS — N390 Urinary tract infection, site not specified: Secondary | ICD-10-CM | POA: Diagnosis not present

## 2020-10-23 DIAGNOSIS — M1990 Unspecified osteoarthritis, unspecified site: Secondary | ICD-10-CM

## 2020-10-23 DIAGNOSIS — I2510 Atherosclerotic heart disease of native coronary artery without angina pectoris: Secondary | ICD-10-CM | POA: Diagnosis not present

## 2020-10-23 DIAGNOSIS — G8929 Other chronic pain: Secondary | ICD-10-CM | POA: Diagnosis not present

## 2020-10-23 DIAGNOSIS — N3281 Overactive bladder: Secondary | ICD-10-CM

## 2020-10-23 DIAGNOSIS — N183 Chronic kidney disease, stage 3 unspecified: Secondary | ICD-10-CM | POA: Diagnosis not present

## 2020-10-23 DIAGNOSIS — B964 Proteus (mirabilis) (morganii) as the cause of diseases classified elsewhere: Secondary | ICD-10-CM | POA: Diagnosis not present

## 2020-10-23 DIAGNOSIS — M542 Cervicalgia: Secondary | ICD-10-CM

## 2020-10-23 MED ORDER — BLOOD GLUCOSE METER KIT: PACK | 0 refills | Status: DC

## 2020-10-24 ENCOUNTER — Other Ambulatory Visit (HOSPITAL_COMMUNITY): Payer: Self-pay

## 2020-10-24 DIAGNOSIS — I129 Hypertensive chronic kidney disease with stage 1 through stage 4 chronic kidney disease, or unspecified chronic kidney disease: Secondary | ICD-10-CM | POA: Diagnosis not present

## 2020-10-24 DIAGNOSIS — N183 Chronic kidney disease, stage 3 unspecified: Secondary | ICD-10-CM | POA: Diagnosis not present

## 2020-10-24 DIAGNOSIS — E1122 Type 2 diabetes mellitus with diabetic chronic kidney disease: Secondary | ICD-10-CM | POA: Diagnosis not present

## 2020-10-24 DIAGNOSIS — G9341 Metabolic encephalopathy: Secondary | ICD-10-CM | POA: Diagnosis not present

## 2020-10-24 DIAGNOSIS — N390 Urinary tract infection, site not specified: Secondary | ICD-10-CM | POA: Diagnosis not present

## 2020-10-24 DIAGNOSIS — B964 Proteus (mirabilis) (morganii) as the cause of diseases classified elsewhere: Secondary | ICD-10-CM | POA: Diagnosis not present

## 2020-10-24 MED ORDER — LENALIDOMIDE 10 MG PO CAPS: 10.0000 mg | ORAL_CAPSULE | Freq: Every day | ORAL | 0 refills | Status: DC

## 2020-10-24 NOTE — Telephone Encounter (Signed)
Chart reviewed. Revlimid refilled per last office note with Dr. Katragadda.  

## 2020-10-27 DIAGNOSIS — N183 Chronic kidney disease, stage 3 unspecified: Secondary | ICD-10-CM | POA: Diagnosis not present

## 2020-10-27 DIAGNOSIS — I129 Hypertensive chronic kidney disease with stage 1 through stage 4 chronic kidney disease, or unspecified chronic kidney disease: Secondary | ICD-10-CM | POA: Diagnosis not present

## 2020-10-27 DIAGNOSIS — E1122 Type 2 diabetes mellitus with diabetic chronic kidney disease: Secondary | ICD-10-CM | POA: Diagnosis not present

## 2020-10-27 DIAGNOSIS — G9341 Metabolic encephalopathy: Secondary | ICD-10-CM | POA: Diagnosis not present

## 2020-10-27 DIAGNOSIS — N390 Urinary tract infection, site not specified: Secondary | ICD-10-CM | POA: Diagnosis not present

## 2020-10-27 DIAGNOSIS — B964 Proteus (mirabilis) (morganii) as the cause of diseases classified elsewhere: Secondary | ICD-10-CM | POA: Diagnosis not present

## 2020-10-28 DIAGNOSIS — I129 Hypertensive chronic kidney disease with stage 1 through stage 4 chronic kidney disease, or unspecified chronic kidney disease: Secondary | ICD-10-CM | POA: Diagnosis not present

## 2020-10-28 DIAGNOSIS — G9341 Metabolic encephalopathy: Secondary | ICD-10-CM | POA: Diagnosis not present

## 2020-10-28 DIAGNOSIS — N183 Chronic kidney disease, stage 3 unspecified: Secondary | ICD-10-CM | POA: Diagnosis not present

## 2020-10-28 DIAGNOSIS — E1122 Type 2 diabetes mellitus with diabetic chronic kidney disease: Secondary | ICD-10-CM | POA: Diagnosis not present

## 2020-10-28 DIAGNOSIS — B964 Proteus (mirabilis) (morganii) as the cause of diseases classified elsewhere: Secondary | ICD-10-CM | POA: Diagnosis not present

## 2020-10-28 DIAGNOSIS — N390 Urinary tract infection, site not specified: Secondary | ICD-10-CM | POA: Diagnosis not present

## 2020-10-30 DIAGNOSIS — G8929 Other chronic pain: Secondary | ICD-10-CM | POA: Diagnosis not present

## 2020-10-30 DIAGNOSIS — N3281 Overactive bladder: Secondary | ICD-10-CM | POA: Diagnosis not present

## 2020-10-30 DIAGNOSIS — Z7984 Long term (current) use of oral hypoglycemic drugs: Secondary | ICD-10-CM | POA: Diagnosis not present

## 2020-10-30 DIAGNOSIS — M1990 Unspecified osteoarthritis, unspecified site: Secondary | ICD-10-CM | POA: Diagnosis not present

## 2020-10-30 DIAGNOSIS — E039 Hypothyroidism, unspecified: Secondary | ICD-10-CM | POA: Diagnosis not present

## 2020-10-30 DIAGNOSIS — I2510 Atherosclerotic heart disease of native coronary artery without angina pectoris: Secondary | ICD-10-CM | POA: Diagnosis not present

## 2020-10-30 DIAGNOSIS — M542 Cervicalgia: Secondary | ICD-10-CM | POA: Diagnosis not present

## 2020-10-30 DIAGNOSIS — E8809 Other disorders of plasma-protein metabolism, not elsewhere classified: Secondary | ICD-10-CM | POA: Diagnosis not present

## 2020-10-30 DIAGNOSIS — I129 Hypertensive chronic kidney disease with stage 1 through stage 4 chronic kidney disease, or unspecified chronic kidney disease: Secondary | ICD-10-CM | POA: Diagnosis not present

## 2020-10-30 DIAGNOSIS — Z9181 History of falling: Secondary | ICD-10-CM | POA: Diagnosis not present

## 2020-10-30 DIAGNOSIS — G9341 Metabolic encephalopathy: Secondary | ICD-10-CM | POA: Diagnosis not present

## 2020-10-30 DIAGNOSIS — E669 Obesity, unspecified: Secondary | ICD-10-CM | POA: Diagnosis not present

## 2020-10-30 DIAGNOSIS — E785 Hyperlipidemia, unspecified: Secondary | ICD-10-CM | POA: Diagnosis not present

## 2020-10-30 DIAGNOSIS — N390 Urinary tract infection, site not specified: Secondary | ICD-10-CM | POA: Diagnosis not present

## 2020-10-30 DIAGNOSIS — E1122 Type 2 diabetes mellitus with diabetic chronic kidney disease: Secondary | ICD-10-CM | POA: Diagnosis not present

## 2020-10-30 DIAGNOSIS — M436 Torticollis: Secondary | ICD-10-CM | POA: Diagnosis not present

## 2020-10-30 DIAGNOSIS — C9000 Multiple myeloma not having achieved remission: Secondary | ICD-10-CM | POA: Diagnosis not present

## 2020-10-30 DIAGNOSIS — U071 COVID-19: Secondary | ICD-10-CM | POA: Diagnosis not present

## 2020-10-30 DIAGNOSIS — Z79891 Long term (current) use of opiate analgesic: Secondary | ICD-10-CM | POA: Diagnosis not present

## 2020-10-30 DIAGNOSIS — E46 Unspecified protein-calorie malnutrition: Secondary | ICD-10-CM | POA: Diagnosis not present

## 2020-10-30 DIAGNOSIS — K219 Gastro-esophageal reflux disease without esophagitis: Secondary | ICD-10-CM | POA: Diagnosis not present

## 2020-10-30 DIAGNOSIS — N183 Chronic kidney disease, stage 3 unspecified: Secondary | ICD-10-CM | POA: Diagnosis not present

## 2020-10-30 DIAGNOSIS — B964 Proteus (mirabilis) (morganii) as the cause of diseases classified elsewhere: Secondary | ICD-10-CM | POA: Diagnosis not present

## 2020-11-05 ENCOUNTER — Other Ambulatory Visit: Payer: Self-pay

## 2020-11-05 ENCOUNTER — Ambulatory Visit (INDEPENDENT_AMBULATORY_CARE_PROVIDER_SITE_OTHER): Payer: Medicare Other

## 2020-11-05 VITALS — Ht 64.0 in | Wt 156.0 lb

## 2020-11-05 DIAGNOSIS — Z0000 Encounter for general adult medical examination without abnormal findings: Secondary | ICD-10-CM

## 2020-11-05 NOTE — Patient Instructions (Addendum)
Kim Mills , Thank you for taking time to come for your Medicare Wellness Visit. I appreciate your ongoing commitment to your health goals. Please review the following plan we discussed and let me know if I can assist you in the future.   Screening recommendations/referrals: Colonoscopy: Done 04/01/2014 Repeat in 10 years  Mammogram: Done 06/19/2019 Repeat annually  Bone Density: Done 10/21/2014 Repeat every 2 years  Recommended yearly ophthalmology/optometry visit for glaucoma screening and checkup. DR [facility]. Recommended yearly dental visit for hygiene and checkup  Vaccinations: Influenza vaccine: Declines Pneumococcal vaccine: Due Tdap vaccine: Due Shingles vaccine: Shingrix discussed. Please contact your pharmacy for coverage information.     Covid-19: Done 12/10/2019  Advanced directives: Please bring a copy of your health care power of attorney and living will to the office to be added to your chart at your convenience.   Conditions/risks identified: Aim for 30 minutes of exercise each day, drink 6-8 glasses of water and eat lots of fruits and vegetables.   Next appointment: Follow up in one year for your annual wellness visit 2023.   Preventive Care 3 Years and Older, Female Preventive care refers to lifestyle choices and visits with your health care provider that can promote health and wellness. What does preventive care include? A yearly physical exam. This is also called an annual well check. Dental exams once or twice a year. Routine eye exams. Ask your health care provider how often you should have your eyes checked. Personal lifestyle choices, including: Daily care of your teeth and gums. Regular physical activity. Eating a healthy diet. Avoiding tobacco and drug use. Limiting alcohol use. Practicing safe sex. Taking low-dose aspirin every day. Taking vitamin and mineral supplements as recommended by your health care provider. What  happens during an annual well check? The services and screenings done by your health care provider during your annual well check will depend on your age, overall health, lifestyle risk factors, and family history of disease. Counseling  Your health care provider may ask you questions about your: Alcohol use. Tobacco use. Drug use. Emotional well-being. Home and relationship well-being. Sexual activity. Eating habits. History of falls. Memory and ability to understand (cognition). Work and work Statistician. Reproductive health. Screening  You may have the following tests or measurements: Height, weight, and BMI. Blood pressure. Lipid and cholesterol levels. These may be checked every 5 years, or more frequently if you are over 62 years old. Skin check. Lung cancer screening. You may have this screening every year starting at age 40 if you have a 30-pack-year history of smoking and currently smoke or have quit within the past 15 years. Fecal occult blood test (FOBT) of the stool. You may have this test every year starting at age 54. Flexible sigmoidoscopy or colonoscopy. You may have a sigmoidoscopy every 5 years or a colonoscopy every 10 years starting at age 15. Hepatitis C blood test. Hepatitis B blood test. Sexually transmitted disease (STD) testing. Diabetes screening. This is done by checking your blood sugar (glucose) after you have not eaten for a while (fasting). You may have this done every 1-3 years. Bone density scan. This is done to screen for osteoporosis. You may have this done starting at age 25. Mammogram. This may be done every 1-2 years. Talk to your health care provider about how often you should have regular mammograms. Talk with your health care provider about your test results, treatment options, and if necessary, the need for more tests. Vaccines  Your health care provider may recommend certain vaccines, such as: Influenza vaccine. This is recommended every  year. Tetanus, diphtheria, and acellular pertussis (Tdap, Td) vaccine. You may need a Td booster every 10 years. Zoster vaccine. You may need this after age 56. Pneumococcal 13-valent conjugate (PCV13) vaccine. One dose is recommended after age 83. Pneumococcal polysaccharide (PPSV23) vaccine. One dose is recommended after age 62. Talk to your health care provider about which screenings and vaccines you need and how often you need them. This information is not intended to replace advice given to you by your health care provider. Make sure you discuss any questions you have with your health care provider. Document Released: 02/21/2015 Document Revised: 10/15/2015 Document Reviewed: 11/26/2014 Elsevier Interactive Patient Education  2017 [place] Prevention in the Home Falls can cause injuries. They can happen to people of all ages. There are many things you can do to make your home safe and to help prevent falls. What can I do on the outside of my home? Regularly fix the edges of walkways and driveways and fix any cracks. Remove anything that might make you trip as you walk through a door, such as a raised step or threshold. Trim any bushes or trees on the path to your home. Use bright outdoor lighting. Clear any walking paths of anything that might make someone trip, such as rocks or tools. Regularly check to see if handrails are loose or broken. Make sure that both sides of any steps have handrails. Any raised decks and porches should have guardrails on the edges. Have any leaves, snow, or ice cleared regularly. Use sand or salt on walking paths during winter. Clean up any spills in your garage right away. This includes oil or grease spills. What can I do in the bathroom? Use night lights. Install grab bars by the toilet and in the tub and shower. Do not use towel bars as grab bars. Use non-skid mats or decals in the tub or shower. If you need to sit down in the shower, use a  plastic, non-slip stool. Keep the floor dry. Clean up any water that spills on the floor as soon as it happens. Remove soap buildup in the tub or shower regularly. Attach bath mats securely with double-sided non-slip rug tape. Do not have throw rugs and other things on the floor that can make you trip. What can I do in the bedroom? Use night lights. Make sure that you have a light by your bed that is easy to reach. Do not use any sheets or blankets that are too big for your bed. They should not hang down onto the floor. Have a firm chair that has side arms. You can use this for support while you get dressed. Do not have throw rugs and other things on the floor that can make you trip. What can I do in the kitchen? Clean up any spills right away. Avoid walking on wet floors. Keep items that you use a lot in easy-to-reach places. If you need to reach something above you, use a strong step stool that has a grab bar. Keep electrical cords out of the way. Do not use floor polish or wax that makes floors slippery. If you must use wax, use non-skid floor wax. Do not have throw rugs and other things on the floor that can make you trip. What can I do with my stairs? Do not leave any items on the stairs. Make sure that there are  handrails on both sides of the stairs and use them. Fix handrails that are broken or loose. Make sure that handrails are as long as the stairways. Check any carpeting to make sure that it is firmly attached to the stairs. Fix any carpet that is loose or worn. Avoid having throw rugs at the top or bottom of the stairs. If you do have throw rugs, attach them to the floor with carpet tape. Make sure that you have a light switch at the top of the stairs and the bottom of the stairs. If you do not have them, ask someone to add them for you. What else can I do to help prevent falls? Wear shoes that: Do not have high heels. Have rubber bottoms. Are comfortable and fit you  well. Are closed at the toe. Do not wear sandals. If you use a stepladder: Make sure that it is fully opened. Do not climb a closed stepladder. Make sure that both sides of the stepladder are locked into place. Ask someone to hold it for you, if possible. Clearly mark and make sure that you can see: Any grab bars or handrails. First and last steps. Where the edge of each step is. Use tools that help you move around (mobility aids) if they are needed. These include: Canes. Walkers. Scooters. Crutches. Turn on the lights when you go into a dark area. Replace any light bulbs as soon as they burn out. Set up your furniture so you have a clear path. Avoid moving your furniture around. If any of your floors are uneven, fix them. If there are any pets around you, be aware of where they are. Review your medicines with your doctor. Some medicines can make you feel dizzy. This can increase your chance of falling. Ask your doctor what other things that you can do to help prevent falls. This information is not intended to replace advice given to you by your health care provider. Make sure you discuss any questions you have with your health care provider. Document Released: 11/21/2008 Document Revised: 07/03/2015 Document Reviewed: 03/01/2014 Elsevier Interactive Patient Education  2017 [employer].

## 2020-11-05 NOTE — Progress Notes (Signed)
Subjective:   Kim Mills is a 80 y.o. female who presents for an Initial Medicare Annual Wellness Visit.  Review of Systems    Virtual Visit via Telephone Note  I connected with  Kim Mills on 11/05/20 at  3:40 PM EDT by telephone and verified that I am speaking with the correct person using two identifiers.  Location: Patient: HOME Provider: [facility] Persons participating in the virtual visit: patient/Nurse Health Advisor   I discussed the limitations, risks, security and privacy concerns of performing an evaluation and management service by telephone and the availability of in person appointments. The patient expressed understanding and agreed to proceed.  Interactive audio and video telecommunications were attempted between this nurse and patient, however failed, due to patient having technical difficulties OR patient did not have access to video capability.  We continued and completed visit with audio only.  Some vital signs may be absent or patient reported.   Chriss Driver, LPN  Cardiac Risk Factors include: advanced age (>72mn, >>55women)     Objective:    Today's Vitals   11/05/20 1544 11/05/20 1545  Weight: 156 lb (70.8 kg)   Height: _0  (1.626 m)   PainSc:  0-No pain   Body mass index is 26.78 kg/m.  Advanced Directives 11/05/2020 10/14/2020 10/14/2020 09/25/2020 09/15/2020 08/18/2020 07/21/2020  Does Patient Have a Medical Advance Directive? Yes Yes Yes No;Yes Yes Yes Yes  Type of Advance Directive Living will;Healthcare Power of Attorney Living will;Healthcare Power of [place] Living will;Healthcare Power of Attorney  Does patient want to make changes to medical advance directive? - No - Patient declined No - Patient declined - - No - Patient declined No - Patient declined  Copy of [place]? No  - copy requested - No - copy requested - No - copy requested No - copy requested No - copy requested  Would patient like information on creating a medical advance directive? - No - Patient declined No - Patient declined - No - Patient declined No - Patient declined No - Patient declined    Current Medications (verified) Outpatient Encounter Medications as of 11/05/2020  Medication Sig   acyclovir (ZOVIRAX) 400 MG tablet Take 1 tablet (400 mg total) by mouth 2 (two) times daily.   ASPIRIN 81 PO Take 1 tablet by mouth daily.   blood glucose meter kit and supplies Dispense based on patient and insurance preference. Use up to four times daily as directed. (FOR ICD-10 E10.9, E11.9).   Blood Glucose Monitoring Suppl (BLOOD GLUCOSE SYSTEM PAK) KIT Please dispense based on patient and insurance preference. Use as directed to monitor FSBS 2x daily. Dx: E11.9.   Calcium 500 MG tablet Take 600 mg by mouth 2 (two) times daily.   fluticasone (FLONASE) 50 MCG/ACT nasal spray USE TWO SPRAYS IN BOTH NOSTRILS DAILY (Patient taking differently: Place 2 sprays into both nostrils daily.)   gabapentin (NEURONTIN) 300 MG capsule TAKE ONE CAPSULE BY MOUTH EVERY NIGHT AT BEDTIME   glipiZIDE (GLUCOTROL) 5 MG tablet TAKE ONE TABLET (5MG TOTAL) BY MOUTH TWOTIMES DAILY (Patient taking differently: Take 5 mg by mouth 2 (two) times daily before a meal.)   Glucose Blood (BLOOD GLUCOSE TEST STRIPS) STRP Please dispense based on patient and insurance preference. Use as directed to monitor FSBS 2x daily. Dx: E11.9.   HM LORATADINE 10 MG  tablet TAKE ONE TABLET (10MG TOTAL) BY MOUTH DAILY (Patient taking differently: Take 10 mg by mouth daily as needed for allergies.)   HYDROcodone-acetaminophen (NORCO/VICODIN) 5-325 MG tablet Take 1 tablet by mouth every 6 (six) hours as needed for moderate pain or severe pain.   Lancets MISC Please dispense based on patient and insurance preference. Use as directed to monitor FSBS 2x daily. Dx:  E11.9.   lenalidomide (REVLIMID) 10 MG capsule Take 1 capsule (10 mg total) by mouth daily. 14 days on, 7 days off   magic mouthwash w/lidocaine SOLN Take 5 mLs by mouth 4 (four) times daily.   magnesium oxide (MAG-OX) 400 (240 Mg) MG tablet TAKE ONE TABLET (400MG TOTAL) BY MOUTH TWO TIMES DAILY (Patient taking differently: Take 400 mg by mouth 2 (two) times daily.)   meclizine (ANTIVERT) 25 MG tablet Take 25 mg by mouth every 6 (six) hours as needed for dizziness.    metFORMIN (GLUCOPHAGE) 1000 MG tablet TAKE ONE TABLET BY MOUTH TWICE A DAY   metoprolol tartrate (LOPRESSOR) 25 MG tablet TAKE ONE-HALF TABLET (12.5MG TOTAL) BY MOUTH TWO TIMES DAILY (Patient taking differently: Take 12.5 mg by mouth 2 (two) times daily.)   omeprazole (PRILOSEC) 40 MG capsule TAKE ONE CAPSULE BY MOUTH DAILY   polyethylene glycol (MIRALAX / GLYCOLAX) 17 g packet Take 17 g by mouth daily.   potassium chloride SA (KLOR-CON) 20 MEQ tablet Take 1 tablet (20 mEq total) by mouth 2 (two) times daily.   simvastatin (ZOCOR) 40 MG tablet TAKE ONE TABLET (40MG TOTAL) BY MOUTH BEDTIME (Patient taking differently: Take 40 mg by mouth at bedtime.)   SYNTHROID 137 MCG tablet TAKE ONE TABLET (137MCG TOTAL) BY MOUTH DAILY BEFORE BREAKFAST (Patient taking differently: Take 137 mcg by mouth daily before breakfast.)   traMADol (ULTRAM) 50 MG tablet TAKE ONE TABLET (50MG TOTAL) BY MOUTH TWO TIMES DAILY AS NEEDED (Patient taking differently: Take 50 mg by mouth 2 (two) times daily as needed for moderate pain.)   zinc oxide (MEIJER ZINC OXIDE) 20 % ointment Apply 1 application topically as needed for irritation.   No facility-administered encounter medications on file as of 11/05/2020.    Allergies (verified) Patient has no known allergies.   History: Past Medical History:  Diagnosis Date   Arthritis    Cancer ([place])    Cataract    Chest pain    Associated with weakness and fatigue   Diabetes mellitus    A1c of 7.4 in 08/2010    GERD (gastroesophageal reflux disease)    Glaucoma    Hyperlipidemia    Lipid profile in 08/2010:190, 121, 48, 118; normal CBC and CMet   Hypertension    Lipid profile in 08/2010:190, 121, 48, 118; normal CBC and CMet   Hypothyroidism    Hypothyroidism    Obesity    Past Surgical History:  Procedure Laterality Date   CHOLECYSTECTOMY     COLONOSCOPY N/A 04/01/2014   RMR: Melanosis coli. colonic polyps removed as described above.    CYSTOSCOPY W/ URETERAL STENT PLACEMENT Right 09/13/2019   Procedure: CYSTOSCOPY WITH RIGHT  RETROGRADE PYELOGRAM; BLADDER BIOPSY;  Surgeon: Cleon Gustin, MD;  Location: [facility];  Service: Urology;  Laterality: Right;   LEFT HEART CATH AND CORONARY ANGIOGRAPHY N/A 10/07/2017   Procedure: LEFT HEART CATH AND CORONARY ANGIOGRAPHY;  Surgeon: Troy Sine, MD;  Location: [facility];  Service: Cardiovascular;  Laterality: N/A;   THYROIDECTOMY, PARTIAL     Family History  Problem Relation Age of Onset   Anemia Father    Arthritis Mother    Stroke Brother    Cancer Sister        unknown kind   Diabetes Sister    Dementia Sister    Cancer Niece        Breast   Cancer Brother    Healthy Son    Healthy Son    Healthy Son    Healthy Daughter    Healthy Daughter    Healthy Daughter    Sarcoidosis Daughter    Social History   Socioeconomic History   Marital status: Widowed    Spouse name: Not on file   Number of children: 7   Years of education: Not on file   Highest education level: Not on file  Occupational History    Employer: RETIRED  Tobacco Use   Smoking status: Never    Passive exposure: Yes   Smokeless tobacco: Never  Vaping Use   Vaping Use: Never used  Substance and Sexual Activity   Alcohol use: Never   Drug use: Never   Sexual activity: Not Currently  Other Topics Concern   Not on file  Social History Narrative   Daughter, Corky Sing, lives with and assists patient.    Social Determinants of Health   Financial Resource  Strain: Low Risk    Difficulty of Paying Living Expenses: Not hard at all  Food Insecurity: No Food Insecurity   Worried About Charity fundraiser in the Last Year: Never true   [place] in the Last Year: Never true  Transportation Needs: No Transportation Needs   Lack of Transportation (Medical): No   Lack of Transportation (Non-Medical): No  Physical Activity: Sufficiently Active   Days of Exercise per Week: 5 days   Minutes of Exercise per Session: 30 min  Stress: No Stress Concern Present   Feeling of Stress : Not at all  Social Connections: Moderately Integrated   Frequency of Communication with Friends and Family: More than three times a week   Frequency of Social Gatherings with Friends and Family: More than three times a week   Attends Religious Services: 1 to 4 times per year   Active Member of [employer] or Organizations: Yes   Attends Archivist Meetings: 1 to 4 times per year   Marital Status: Widowed    Tobacco Counseling Counseling given: Not Answered   Clinical Intake:  Pre-visit preparation completed: Yes  Pain : No/denies pain Pain Score: 0-No pain     BMI - recorded: 26.78 Nutritional Status: BMI 25 -29 Overweight Nutritional Risks: Unintentional weight loss Diabetes: Yes  How often do you need to have someone help you when you read instructions, pamphlets, or other written materials from your doctor or pharmacy?: 1 - Never  Diabetic?Nutrition Risk Assessment:  Has the patient had any N/V/D within the last 2 months?  No  Does the patient have any non-healing wounds?  No  Has the patient had any unintentional weight loss or weight gain?  Yes   Diabetes:  Is the patient diabetic?  Yes  If diabetic, was a CBG obtained today?  No  Did the patient bring in their glucometer from home?  No  Phone visit.  How often do you monitor your CBG's? PRN.   Financial Strains and Diabetes Management:  Are you having any financial strains with the  device, your supplies or your medication? No .  Does the patient want  to be seen by Chronic Care Management for management of their diabetes?  No  Would the patient like to be referred to a Nutritionist or for Diabetic Management?  No   Diabetic Exams:  Diabetic Eye Exam: Completed 06/16/2020. Pt has been advised about the importance in completing this exam.  Diabetic Foot Exam: Completed DUE. Pt has been advised about the importance in completing this exam.    Interpreter Needed?: No  Information entered by :: MJ Krishna Heuer, LPN   Activities of Daily Living In your present state of health, do you have any difficulty performing the following activities: 11/05/2020 09/26/2020  Hearing? Y N  Vision? N N  Difficulty concentrating or making decisions? N Y  Walking or climbing stairs? Y Y  Dressing or bathing? N Y  Doing errands, shopping? Y N  Preparing Food and eating ? Y -  Using the Toilet? N -  In the past six months, have you accidently leaked urine? Y -  Comment Occasionally -  Do you have problems with loss of bowel control? N -  Managing your Medications? Y -  Managing your Finances? Y -  Housekeeping or managing your Housekeeping? Y -  Some recent data might be hidden    Patient Care Team: Lindell Spar, MD as PCP - General (Internal Medicine) Herminio Commons, MD (Inactive) as PCP - Cardiology (Cardiology) Gala Romney Cristopher Estimable, MD as Consulting Physician (Gastroenterology) Edythe Clarity, [facility] as Pharmacist (Pharmacist)  Indicate any recent Medical Services you may have received from other than [facility] providers in the past year (date may be approximate).     Assessment:   This is a routine wellness examination for [facility].  Hearing/Vision screen Hearing Screening - Comments:: Some hearing issues.  Vision Screening - Comments:: Glasses. Up to date on eye exam.   Dietary issues and exercise activities discussed: Current Exercise Habits: Home exercise routine, Type of  exercise: stretching;walking, Time (Minutes): 30, Frequency (Times/Week): 5, Weekly Exercise (Minutes/Week): 150, Intensity: Mild, Exercise limited by: None identified   Goals Addressed             This Visit's Progress    Have 3 meals a day       Pt states she would like to be well. Currently dx with cancer.       Depression Screen PHQ 2/9 Scores 11/05/2020 09/24/2020 06/24/2020 05/13/2020 08/17/2019 04/12/2018 01/10/2018  PHQ - 2 Score 0 0 0 0 2 0 0  PHQ- 9 Score - - - - 6 - 2    Fall Risk Fall Risk  11/05/2020 09/24/2020 06/24/2020 05/13/2020 08/17/2019  Falls in the past year? 1 1 0 0 1  Comment - - - - -  Number falls in past yr: 1 1 0 0 1  Injury with Fall? 1 1 0 0 0  Risk for fall due to : Impaired balance/gait;Impaired mobility;Impaired vision;History of fall(s) History of fall(s);Impaired balance/gait;Impaired mobility Impaired balance/gait;Impaired mobility No Fall Risks Impaired balance/gait;Impaired mobility  Follow up Falls prevention discussed Falls evaluation completed;Education provided;Falls prevention discussed Falls evaluation completed;Education provided;Falls prevention discussed;Follow up appointment Falls evaluation completed Falls evaluation completed    FALL RISK PREVENTION PERTAINING TO THE HOME:  Any stairs in or around the home? Yes  If so, are there any without handrails? No  Home free of loose throw rugs in walkways, pet beds, electrical cords, etc? Yes  Adequate lighting in your home to reduce risk of falls? Yes   ASSISTIVE DEVICES UTILIZED TO PREVENT  FALLS:  Life alert? No  Use of a cane, walker or w/c? Yes  Grab bars in the bathroom? Yes  Shower chair or bench in shower? Yes  Elevated toilet seat or a handicapped toilet? Yes   TIMED UP AND GO:  Was the test performed? No .   Cognitive Function:     6CIT Screen 11/05/2020  What Year? 0 points  What month? 0 points  What time? 0 points  Count back from 20 0 points  Months in reverse 4 points   Repeat phrase 2 points  Total Score 6    Immunizations Immunization History  Administered Date(s) Administered   Moderna Sars-Covid-2 Vaccination 12/10/2019   Zoster Recombinat (Shingrix) 04/12/2018    TDAP status: Due, Education has been provided regarding the importance of this vaccine. Advised may receive this vaccine at local pharmacy or Health Dept. Aware to provide a copy of the vaccination record if obtained from local pharmacy or Health Dept. Verbalized acceptance and understanding.  Flu Vaccine status: Declined, Education has been provided regarding the importance of this vaccine but patient still declined. Advised may receive this vaccine at local pharmacy or Health Dept. Aware to provide a copy of the vaccination record if obtained from local pharmacy or Health Dept. Verbalized acceptance and understanding.  Pneumococcal vaccine status: Due, Education has been provided regarding the importance of this vaccine. Advised may receive this vaccine at local pharmacy or Health Dept. Aware to provide a copy of the vaccination record if obtained from local pharmacy or Health Dept. Verbalized acceptance and understanding.  Covid-19 vaccine status: Information provided on how to obtain vaccines.   Qualifies for Shingles Vaccine? Yes   Zostavax completed No   Shingrix Completed?: No.    Education has been provided regarding the importance of this vaccine. Patient has been advised to call insurance company to determine out of pocket expense if they have not yet received this vaccine. Advised may also receive vaccine at local pharmacy or Health Dept. Verbalized acceptance and understanding.  Screening Tests Health Maintenance  Topic Date Due   FOOT EXAM  Never done   URINE MICROALBUMIN  Never done   Zoster Vaccines- Shingrix (2 of 2) 06/07/2018   COVID-19 Vaccine (2 - Moderna risk series) 01/07/2020   INFLUENZA VACCINE  11/08/2020 (Originally 09/08/2020)   TETANUS/TDAP  05/13/2021  (Originally 11/22/1959)   HEMOGLOBIN A1C  03/27/2021   OPHTHALMOLOGY EXAM  06/16/2021   DEXA SCAN  Completed   Hepatitis C Screening  Completed   HPV VACCINES  Aged Out    Health Maintenance  Health Maintenance Due  Topic Date Due   FOOT EXAM  Never done   URINE MICROALBUMIN  Never done   Zoster Vaccines- Shingrix (2 of 2) 06/07/2018   COVID-19 Vaccine (2 - Moderna risk series) 01/07/2020    Colorectal cancer screening: Type of screening: Colonoscopy. Completed 04/01/2014. Repeat every 10 years  Mammogram status: Completed 06/19/2019. Repeat every year  Bone Density status: Completed 10/21/2014. Results reflect: Bone density results: OSTEOPENIA. Repeat every 2 years.  Lung Cancer Screening: (Low Dose CT Chest recommended if Age 20-80 years, 30 pack-year currently smoking OR have quit w/in 15years.) does not qualify.     Additional Screening:  Hepatitis C Screening: does not qualify; Completed 09/24/2020  Vision Screening: Recommended annual ophthalmology exams for early detection of glaucoma and other disorders of the eye. Is the patient up to date with their annual eye exam?  Yes  Who is the provider or what  is the name of the office in which the patient attends annual eye exams? Dr. Grandville Silos, [place]. If pt is not established with a provider, would they like to be referred to a provider to establish care? No .   Dental Screening: Recommended annual dental exams for proper oral hygiene  Community Resource Referral / Chronic Care Management: CRR required this visit?  No   CCM required this visit?  No      Plan:     I have personally reviewed and noted the following in the patient's chart:   Medical and social history Use of alcohol, tobacco or illicit drugs  Current medications and supplements including opioid prescriptions. Patient is currently taking opioid prescriptions. Information provided to patient regarding non-opioid alternatives. Patient advised to  discuss non-opioid treatment plan with their provider. Functional ability and status Nutritional status Physical activity Advanced directives List of other physicians Hospitalizations, surgeries, and ER visits in previous 12 months Vitals Screenings to include cognitive, depression, and falls Referrals and appointments  In addition, I have reviewed and discussed with patient certain preventive protocols, quality metrics, and best practice recommendations. A written personalized care plan for preventive services as well as general preventive health recommendations were provided to patient.     Chriss Driver, LPN   2/00/9417   Nurse Notes: Pt states she is doing well. Pt is due for Prevnar, Flu, Tdap and Covid vaccines. Discussed with pt how to obtain. Pt had one shingles vaccine but states she "broke out after shot so I didn't get anymore." Pt overdue for Mammogram and Bone Density, declines scheduling at this time. Needs DM foot exam.

## 2020-11-06 DIAGNOSIS — E039 Hypothyroidism, unspecified: Secondary | ICD-10-CM | POA: Diagnosis not present

## 2020-11-06 DIAGNOSIS — G9341 Metabolic encephalopathy: Secondary | ICD-10-CM | POA: Diagnosis not present

## 2020-11-06 DIAGNOSIS — I129 Hypertensive chronic kidney disease with stage 1 through stage 4 chronic kidney disease, or unspecified chronic kidney disease: Secondary | ICD-10-CM | POA: Diagnosis not present

## 2020-11-06 DIAGNOSIS — M436 Torticollis: Secondary | ICD-10-CM

## 2020-11-06 DIAGNOSIS — E1122 Type 2 diabetes mellitus with diabetic chronic kidney disease: Secondary | ICD-10-CM | POA: Diagnosis not present

## 2020-11-06 DIAGNOSIS — I2510 Atherosclerotic heart disease of native coronary artery without angina pectoris: Secondary | ICD-10-CM | POA: Diagnosis not present

## 2020-11-06 DIAGNOSIS — G8929 Other chronic pain: Secondary | ICD-10-CM | POA: Diagnosis not present

## 2020-11-06 DIAGNOSIS — B964 Proteus (mirabilis) (morganii) as the cause of diseases classified elsewhere: Secondary | ICD-10-CM | POA: Diagnosis not present

## 2020-11-06 DIAGNOSIS — N183 Chronic kidney disease, stage 3 unspecified: Secondary | ICD-10-CM | POA: Diagnosis not present

## 2020-11-06 DIAGNOSIS — M542 Cervicalgia: Secondary | ICD-10-CM

## 2020-11-06 DIAGNOSIS — U071 COVID-19: Secondary | ICD-10-CM | POA: Diagnosis not present

## 2020-11-06 DIAGNOSIS — N390 Urinary tract infection, site not specified: Secondary | ICD-10-CM | POA: Diagnosis not present

## 2020-11-06 DIAGNOSIS — E785 Hyperlipidemia, unspecified: Secondary | ICD-10-CM | POA: Diagnosis not present

## 2020-11-06 DIAGNOSIS — C9000 Multiple myeloma not having achieved remission: Secondary | ICD-10-CM | POA: Diagnosis not present

## 2020-11-07 DIAGNOSIS — N390 Urinary tract infection, site not specified: Secondary | ICD-10-CM | POA: Diagnosis not present

## 2020-11-07 DIAGNOSIS — N183 Chronic kidney disease, stage 3 unspecified: Secondary | ICD-10-CM | POA: Diagnosis not present

## 2020-11-07 DIAGNOSIS — B964 Proteus (mirabilis) (morganii) as the cause of diseases classified elsewhere: Secondary | ICD-10-CM | POA: Diagnosis not present

## 2020-11-07 DIAGNOSIS — E1122 Type 2 diabetes mellitus with diabetic chronic kidney disease: Secondary | ICD-10-CM | POA: Diagnosis not present

## 2020-11-07 DIAGNOSIS — I129 Hypertensive chronic kidney disease with stage 1 through stage 4 chronic kidney disease, or unspecified chronic kidney disease: Secondary | ICD-10-CM | POA: Diagnosis not present

## 2020-11-07 DIAGNOSIS — G9341 Metabolic encephalopathy: Secondary | ICD-10-CM | POA: Diagnosis not present

## 2020-11-10 NOTE — Progress Notes (Signed)
Kim [place], Kim [place] [id number]   CLINIC:  Medical Oncology/Hematology  PCP:  Kim Spar, MD [address] / [place] [address] [phone]   REASON FOR VISIT:  Follow-up for multiple myeloma & normocytic anemia  PRIOR THERAPY:   Velcade x 9 cycles from 10/29/2019 to 05/02/2020 Revlimid 2/3 weeks  NGS Results: not done  CURRENT THERAPY: surveillance  BRIEF ONCOLOGIC HISTORY:  Oncology History  Multiple myeloma not having achieved remission ([place])  10/13/2019 Initial Diagnosis   Multiple myeloma not having achieved remission ([place])   10/29/2019 -  Chemotherapy    Patient is on Treatment Plan: MYELOMA NON-TRANSPLANT CANDIDATES VRD WEEKLY Q21D         CANCER STAGING: Cancer Staging No matching staging information was found for the patient.  INTERVAL HISTORY:  Kim Mills, a 80 y.o. female, returns for routine follow-up of her multiple myeloma & normocytic anemia. [name] was last seen on 10/14/2020.   Today she reports feeling well. She reports continued knee pain for which she take hydrocodone twice daily. She denies constipation but reports diarrhea for which she take imodium. She reports popping and pain on the left side of her jaw while yawning.   REVIEW OF SYSTEMS:  Review of Systems  Constitutional:  Negative for appetite change (60%) and fatigue (50%).  Gastrointestinal:  Positive for diarrhea. Negative for constipation.  Musculoskeletal:  Positive for arthralgias (knee).  Neurological:  Positive for headaches and numbness.  Psychiatric/Behavioral:  Positive for sleep disturbance.   All other systems reviewed and are negative.  PAST MEDICAL/SURGICAL HISTORY:  Past Medical History:  Diagnosis Date   Arthritis    Cancer [facility])    Cataract    Chest pain    Associated with weakness and fatigue   Diabetes mellitus    A1c of 7.4 in 08/2010   GERD (gastroesophageal reflux disease)    Glaucoma     Hyperlipidemia    Lipid profile in 08/2010:190, 121, 48, 118; normal CBC and CMet   Hypertension    Lipid profile in 08/2010:190, 121, 48, 118; normal CBC and CMet   Hypothyroidism    Hypothyroidism    Obesity    Past Surgical History:  Procedure Laterality Date   CHOLECYSTECTOMY     COLONOSCOPY N/A 04/01/2014   RMR: Melanosis coli. colonic polyps removed as described above.    CYSTOSCOPY W/ URETERAL STENT PLACEMENT Right 09/13/2019   Procedure: CYSTOSCOPY WITH RIGHT  RETROGRADE PYELOGRAM; BLADDER BIOPSY;  Surgeon: Kim Gustin, MD;  Location: [facility];  Service: Urology;  Laterality: Right;   LEFT HEART CATH AND CORONARY ANGIOGRAPHY N/A 10/07/2017   Procedure: LEFT HEART CATH AND CORONARY ANGIOGRAPHY;  Surgeon: Kim Sine, MD;  Location: [place] [facility];  Service: Cardiovascular;  Laterality: N/A;   THYROIDECTOMY, PARTIAL      SOCIAL HISTORY:  Social History   Socioeconomic History   Marital status: Widowed    Spouse name: Not on file   Number of children: 7   Years of education: Not on file   Highest education level: Not on file  Occupational History    Employer: RETIRED  Tobacco Use   Smoking status: Never    Passive exposure: Yes   Smokeless tobacco: Never  Vaping Use   Vaping Use: Never used  Substance and Sexual Activity   Alcohol use: Never   Drug use: Never   Sexual activity: Not Currently  Other Topics Concern  Not on file  Social History Narrative   Daughter, Kim Mills, lives with and assists patient.    Social Determinants of Health   Financial Resource Strain: Low Risk    Difficulty of Paying Living Expenses: Not hard at all  Food Insecurity: No Food Insecurity   Worried About Charity fundraiser in the Last Year: Never true   Stout in the Last Year: Never true  Transportation Needs: No Transportation Needs   Lack of Transportation (Medical): No   Lack of Transportation (Non-Medical): No  Physical Activity: Sufficiently Active   Days  of Exercise per Week: 5 days   Minutes of Exercise per Session: 30 min  Stress: No Stress Concern Present   Feeling of Stress : Not at all  Social Connections: Moderately Integrated   Frequency of Communication with Friends and Family: More than three times a week   Frequency of Social Gatherings with Friends and Family: More than three times a week   Attends Religious Services: 1 to 4 times per year   Active Member of [employer] or Organizations: Yes   Attends Archivist Meetings: 1 to 4 times per year   Marital Status: Widowed  Human resources officer Violence: Not At Risk   Fear of Current or Ex-Partner: No   Emotionally Abused: No   Physically Abused: No   Sexually Abused: No    FAMILY HISTORY:  Family History  Problem Relation Age of Onset   Anemia Father    Arthritis Mother    Stroke Brother    Cancer Sister        unknown kind   Diabetes Sister    Dementia Sister    Cancer Niece        Breast   Cancer Brother    Healthy Son    Healthy Son    Healthy Son    Healthy Daughter    Healthy Daughter    Healthy Daughter    Sarcoidosis Daughter     CURRENT MEDICATIONS:  Current Outpatient Medications  Medication Sig Dispense Refill   acyclovir (ZOVIRAX) 400 MG tablet Take 1 tablet (400 mg total) by mouth 2 (two) times daily. 60 tablet 6   ASPIRIN 81 PO Take 1 tablet by mouth daily.     blood glucose meter kit and supplies Dispense based on patient and insurance preference. Use up to four times daily as directed. (FOR ICD-10 E10.9, E11.9). 1 each 0   Blood Glucose Monitoring Suppl (BLOOD GLUCOSE SYSTEM PAK) KIT Please dispense based on patient and insurance preference. Use as directed to monitor FSBS 2x daily. Dx: E11.9. 1 each 1   Calcium 500 MG tablet Take 600 mg by mouth 2 (two) times daily.     fluticasone (FLONASE) 50 MCG/ACT nasal spray USE TWO SPRAYS IN BOTH NOSTRILS DAILY (Patient taking differently: Place 2 sprays into both nostrils daily.) 16 g 6   gabapentin  (NEURONTIN) 300 MG capsule TAKE ONE CAPSULE BY MOUTH EVERY NIGHT AT BEDTIME 90 capsule 3   glipiZIDE (GLUCOTROL) 5 MG tablet TAKE ONE TABLET (5MG TOTAL) BY MOUTH TWOTIMES DAILY (Patient taking differently: Take 5 mg by mouth 2 (two) times daily before a meal.) 180 tablet 3   Glucose Blood (BLOOD GLUCOSE TEST STRIPS) STRP Please dispense based on patient and insurance preference. Use as directed to monitor FSBS 2x daily. Dx: E11.9. 100 each 11   HM LORATADINE 10 MG tablet TAKE ONE TABLET (10MG TOTAL) BY MOUTH DAILY (Patient taking differently: Take  10 mg by mouth daily as needed for allergies.) 30 tablet 0   Lancets MISC Please dispense based on patient and insurance preference. Use as directed to monitor FSBS 2x daily. Dx: E11.9. 100 each 11   lenalidomide (REVLIMID) 10 MG capsule Take 1 capsule (10 mg total) by mouth daily. 14 days on, 7 days off 14 capsule 0   magic mouthwash w/lidocaine SOLN Take 5 mLs by mouth 4 (four) times daily. 450 mL 0   magnesium oxide (MAG-OX) 400 (240 Mg) MG tablet TAKE ONE TABLET (400MG TOTAL) BY MOUTH TWO TIMES DAILY (Patient taking differently: Take 400 mg by mouth 2 (two) times daily.) 120 tablet 1   metFORMIN (GLUCOPHAGE) 1000 MG tablet TAKE ONE TABLET BY MOUTH TWICE A DAY 60 tablet 3   metoprolol tartrate (LOPRESSOR) 25 MG tablet TAKE ONE-HALF TABLET (12.5MG TOTAL) BY MOUTH TWO TIMES DAILY (Patient taking differently: Take 12.5 mg by mouth 2 (two) times daily.) 90 tablet 3   omeprazole (PRILOSEC) 40 MG capsule TAKE ONE CAPSULE BY MOUTH DAILY 90 capsule 3   polyethylene glycol (MIRALAX / GLYCOLAX) 17 g packet Take 17 g by mouth daily. 14 each 0   potassium chloride SA (KLOR-CON) 20 MEQ tablet Take 1 tablet (20 mEq total) by mouth 2 (two) times daily. 10 tablet 0   simvastatin (ZOCOR) 40 MG tablet TAKE ONE TABLET (40MG TOTAL) BY MOUTH BEDTIME (Patient taking differently: Take 40 mg by mouth at bedtime.) 90 tablet 3   SYNTHROID 137 MCG tablet TAKE ONE TABLET (137MCG  TOTAL) BY MOUTH DAILY BEFORE BREAKFAST (Patient taking differently: Take 137 mcg by mouth daily before breakfast.) 30 tablet 0   HYDROcodone-acetaminophen (NORCO/VICODIN) 5-325 MG tablet Take 1 tablet by mouth every 6 (six) hours as needed for moderate pain or severe pain. (Patient not taking: Reported on 11/11/2020) 60 tablet 0   meclizine (ANTIVERT) 25 MG tablet Take 25 mg by mouth every 6 (six) hours as needed for dizziness.  (Patient not taking: Reported on 11/11/2020)     traMADol (ULTRAM) 50 MG tablet TAKE ONE TABLET (50MG TOTAL) BY MOUTH TWO TIMES DAILY AS NEEDED (Patient not taking: Reported on 11/11/2020) 60 tablet 0   zinc oxide (MEIJER ZINC OXIDE) 20 % ointment Apply 1 application topically as needed for irritation. (Patient not taking: Reported on 11/11/2020) 56.7 g 0   No current facility-administered medications for this visit.    ALLERGIES:  No Known Allergies  PHYSICAL EXAM:  Performance status (ECOG): 3 - Symptomatic, >50% confined to bed  Vitals:   11/11/20 1325  BP: (!) 154/70  Pulse: (!) 57  Resp: 18  Temp: 99 F (37.2 C)  SpO2: 100%   Wt Readings from Last 3 Encounters:  11/11/20 160 lb 9.6 oz (72.8 kg)  11/05/20 156 lb (70.8 kg)  10/14/20 156 lb 1.6 oz (70.8 kg)   Physical Exam Vitals reviewed.  Constitutional:      Appearance: Normal appearance.     Comments: In wheelchair  HENT:     Mouth/Throat:     Mouth: Mucous membranes are moist. No oral lesions.  Cardiovascular:     Rate and Rhythm: Normal rate and regular rhythm.     Pulses: Normal pulses.     Heart sounds: Normal heart sounds.  Pulmonary:     Effort: Pulmonary effort is normal.     Breath sounds: Normal breath sounds.  Neurological:     General: No focal deficit present.     Mental Status: She is alert  and oriented to person, place, and time.  Psychiatric:        Mood and Affect: Mood normal.        Behavior: Behavior normal.     LABORATORY DATA:  I have reviewed the labs as listed.   CBC Latest Ref Rng & Units 11/11/2020 10/14/2020 09/26/2020  WBC 4.0 - 10.5 K/uL 6.3 7.0 8.2  Hemoglobin 12.0 - 15.0 g/dL 10.4(L) 9.9(L) 9.9(L)  Hematocrit 36.0 - 46.0 % 33.8(L) 31.9(L) 32.1(L)  Platelets 150 - 400 K/uL 208 221 127(L)   CMP Latest Ref Rng & Units 10/14/2020 09/29/2020 09/28/2020  Glucose 70 - 99 mg/dL 104(H) 123(H) 103(H)  BUN 8 - 23 mg/dL _0 Creatinine 0.44 - 1.00 mg/dL 1.08(H) 0.82 0.89  Sodium 135 - 145 mmol/L 140 136 137  Potassium 3.5 - 5.1 mmol/L 3.6 3.9 3.9  Chloride 98 - 111 mmol/L 105 108 104  CO2 22 - 32 mmol/L _1 Calcium 8.9 - 10.3 mg/dL 7.4(L) 7.1(L) 6.9(L)  Total Protein 6.5 - 8.1 g/dL 6.5 5.4(L) 5.8(L)  Total Bilirubin 0.3 - 1.2 mg/dL 0.8 0.7 0.9  Alkaline Phos 38 - 126 U/L 54 50 61  AST 15 - 41 U/L 16 36 36  ALT 0 - 44 U/L _2 DIAGNOSTIC IMAGING:  I have independently reviewed the scans and discussed with the patient. No results found.   ASSESSMENT:  1.  Normocytic anemia: -Patient seen at the request of Dr. Buelah Manis for further work-up and management of normocytic anemia. -Recent CBC on 08/21/2019 shows hemoglobin 6.9 with MCV of 88.3.  White count and platelets are normal.  No history of CKD.  Denies any bleeding per rectum or melena. Last colonoscopy on 04/01/2014 shows diffusely pigmented rectal mucosa consistent with melanosis Kim Mills otherwise normal mucosa.  Diffusely pigmented colonic mucosa, 2 diminutive polyps in the mid ascending segment, otherwise the remainder of the colon mucosa was normal.  Biopsy consistent with tubular adenoma. -CT renal study on 05/11/2019 shows normal-sized spleen with normal liver.  No other abnormal adenopathy. -Denies any prior history of blood transfusion.  She is currently taking iron tablet twice daily.   2.  IgG lambda plasma cell myeloma: -Skeletal survey on 09/12/2019 shows diffuse faint lucencies in the skull, bilateral femurs. -Bone marrow biopsy on 09/27/2019 shows 70% plasma cells in  hypercellular marrow.  Chromosome analysis was 77, XX. FISH panel was normal.  However quality of specimen is compromised. -LDH normal, beta-2 microglobulin 2.9.  M spike was 2.1 g.  Kappa light chains 19.7, lambda light chains 14.3 with ratio of 1.38. -24-hour urine shows nonnephrotic range proteinuria.  Urine immunofixation was positive. - 9 cycles of RVD from 10/29/2019 through 05/02/2020. - Because of her age and comorbidities, she was not considered a candidate for bone marrow transplant.   PLAN:  1.  IgG lambda plasma cell myeloma: - She is taking Revlimid 10 mg 2 weeks on/1 week off. - Reviewed myeloma panel from 10/14/2020.  SPEP was negative for M spike.  Free kappa light chains and lambda light chains are elevated at 68 and 49 respectively.  Ratio was in the normal range 1.4.  Immunofixation was negative. - Labs from today showed creatinine 0.59 and normal LFTs.  CBC was grossly normal.  Hemoglobin is 10.4. - She started Revlimid on 11/09/2020.  Continue Revlimid 10 mg 2 weeks on/1 week off. - RTC 4 weeks with labs and treatment.   2.  Diabetes: -  Continue glipizide and metformin.   3.  Hypertension: - Continue amlodipine and HCTZ.  Blood pressure is well controlled.   4.  Weight loss: - She lost about 25 pounds since May. - She is eating better.  She is drinking boost occasionally. - She gained about 4 pounds since last visit 1 month ago.  We will closely monitor.   5.  Diarrhea: - Diarrhea improved since Revlimid dose was decreased.  Continue Imodium as needed.   6.  Bilateral knee pains: - We increased her hydrocodone to 5/325 twice daily.  No improvement in pain. - She will be seeing Dr. Marlou Sa (orthopedics) on Friday for possible injections.   7.  Infection prophylaxis: - Continue acyclovir 400 mg twice daily.  Continue aspirin 81 mg daily.   8.  Bone protection: - Calcium today 7.5 with albumin 3.4. - Continue calcium 600 mg 3 times daily. - We will hold denosumab  today.  9.  Hypokalemia: -Potassium today is 2.7.  She is not taking home potassium. - We will give her potassium today in the office.  We will start her on potassium 20 mEq daily.   Orders placed this encounter:  No orders of the defined types were placed in this encounter.    Derek Jack, MD [place] [phone]   I, Thana Ates, am acting as a scribe for Dr. Derek Jack.  I, Derek Jack MD, have reviewed the above documentation for accuracy and completeness, and I agree with the above.

## 2020-11-11 ENCOUNTER — Inpatient Hospital Stay (HOSPITAL_COMMUNITY): Payer: Medicare Other

## 2020-11-11 ENCOUNTER — Inpatient Hospital Stay (HOSPITAL_BASED_OUTPATIENT_CLINIC_OR_DEPARTMENT_OTHER): Payer: Medicare Other | Admitting: Hematology

## 2020-11-11 ENCOUNTER — Other Ambulatory Visit: Payer: Self-pay

## 2020-11-11 ENCOUNTER — Inpatient Hospital Stay (HOSPITAL_COMMUNITY): Payer: Medicare Other | Attending: Hematology

## 2020-11-11 VITALS — BP 154/70 | HR 57 | Temp 99.0°F | Resp 18 | Wt 160.6 lb

## 2020-11-11 DIAGNOSIS — M542 Cervicalgia: Secondary | ICD-10-CM | POA: Diagnosis not present

## 2020-11-11 DIAGNOSIS — I10 Essential (primary) hypertension: Secondary | ICD-10-CM | POA: Diagnosis not present

## 2020-11-11 DIAGNOSIS — Z79899 Other long term (current) drug therapy: Secondary | ICD-10-CM | POA: Insufficient documentation

## 2020-11-11 DIAGNOSIS — D649 Anemia, unspecified: Secondary | ICD-10-CM | POA: Diagnosis not present

## 2020-11-11 DIAGNOSIS — M25562 Pain in left knee: Secondary | ICD-10-CM | POA: Insufficient documentation

## 2020-11-11 DIAGNOSIS — E876 Hypokalemia: Secondary | ICD-10-CM

## 2020-11-11 DIAGNOSIS — M25561 Pain in right knee: Secondary | ICD-10-CM | POA: Insufficient documentation

## 2020-11-11 DIAGNOSIS — E119 Type 2 diabetes mellitus without complications: Secondary | ICD-10-CM | POA: Insufficient documentation

## 2020-11-11 DIAGNOSIS — C9000 Multiple myeloma not having achieved remission: Secondary | ICD-10-CM | POA: Insufficient documentation

## 2020-11-11 DIAGNOSIS — R197 Diarrhea, unspecified: Secondary | ICD-10-CM | POA: Insufficient documentation

## 2020-11-11 DIAGNOSIS — Z7984 Long term (current) use of oral hypoglycemic drugs: Secondary | ICD-10-CM | POA: Insufficient documentation

## 2020-11-11 LAB — COMPREHENSIVE METABOLIC PANEL
ALT: 23 U/L (ref 0–44)
AST: 20 U/L (ref 15–41)
Albumin: 3.4 g/dL — ABNORMAL LOW (ref 3.5–5.0)
Alkaline Phosphatase: 64 U/L (ref 38–126)
Anion gap: 8 (ref 5–15)
BUN: 10 mg/dL (ref 8–23)
CO2: 26 mmol/L (ref 22–32)
Calcium: 7.5 mg/dL — ABNORMAL LOW (ref 8.9–10.3)
Chloride: 108 mmol/L (ref 98–111)
Creatinine, Ser: 0.59 mg/dL (ref 0.44–1.00)
GFR, Estimated: >60 mL/min (ref >60)
Glucose, Bld: 130 mg/dL — ABNORMAL HIGH (ref 70–99)
Potassium: 2.7 mmol/L — CL (ref 3.5–5.1)
Sodium: 142 mmol/L (ref 135–145)
Total Bilirubin: 1 mg/dL (ref 0.3–1.2)
Total Protein: 6.7 g/dL (ref 6.5–8.1)

## 2020-11-11 LAB — CBC WITH DIFFERENTIAL/PLATELET
Abs Immature Granulocytes: 0.07 10*3/uL (ref 0.00–0.07)
Basophils Absolute: 0.1 10*3/uL (ref 0.0–0.1)
Basophils Relative: 2 %
Eosinophils Absolute: 0.5 10*3/uL (ref 0.0–0.5)
Eosinophils Relative: 8 %
HCT: 33.8 % — ABNORMAL LOW (ref 36.0–46.0)
Hemoglobin: 10.4 g/dL — ABNORMAL LOW (ref 12.0–15.0)
Immature Granulocytes: 1 %
Lymphocytes Relative: 42 %
Lymphs Abs: 2.7 10*3/uL (ref 0.7–4.0)
MCH: 29.1 pg (ref 26.0–34.0)
MCHC: 30.8 g/dL (ref 30.0–36.0)
MCV: 94.4 fL (ref 80.0–100.0)
Monocytes Absolute: 0.5 10*3/uL (ref 0.1–1.0)
Monocytes Relative: 8 %
Neutro Abs: 2.5 10*3/uL (ref 1.7–7.7)
Neutrophils Relative %: 39 %
Platelets: 208 10*3/uL (ref 150–400)
RBC: 3.58 MIL/uL — ABNORMAL LOW (ref 3.87–5.11)
RDW: 19 % — ABNORMAL HIGH (ref 11.5–15.5)
WBC: 6.3 10*3/uL (ref 4.0–10.5)
nRBC: 0 % (ref 0.0–0.2)

## 2020-11-11 LAB — LACTATE DEHYDROGENASE: LDH: 157 U/L (ref 98–192)

## 2020-11-11 MED ORDER — POTASSIUM CHLORIDE ER 10 MEQ PO TBCR
40.0000 meq | EXTENDED_RELEASE_TABLET | Freq: Once | ORAL | Status: AC
  Administered 2020-11-11: 40 meq via ORAL
  Filled 2020-11-11: qty 4

## 2020-11-11 MED ORDER — POTASSIUM CHLORIDE CRYS ER 10 MEQ PO TBCR: 20.0000 meq | EXTENDED_RELEASE_TABLET | Freq: Every day | ORAL | 2 refills | Status: DC

## 2020-11-11 MED ORDER — POTASSIUM CHLORIDE ER 10 MEQ PO TBCR
40.0000 meq | EXTENDED_RELEASE_TABLET | Freq: Every day | ORAL | Status: DC
  Filled 2020-11-11 (×2): qty 4

## 2020-11-11 NOTE — Patient Instructions (Signed)
[place] at [facility] Discharge Instructions  You were seen today by Dr. Delton Coombes. He went over your recent results, and you received your injection. Dr. Delton Coombes will see you back in 1 month for labs and follow up.   Thank you for choosing [place] at [facility] to provide your oncology and hematology care.  To afford each patient quality time with our provider, please arrive at least 15 minutes before your scheduled appointment time.   If you have a lab appointment with the [facility] please come in thru the Main Entrance and check in at the main information desk  You need to re-schedule your appointment should you arrive 10 or more minutes late.  We strive to give you quality time with our providers, and arriving late affects you and other patients whose appointments are after yours.  Also, if you no show three or more times for appointments you may be dismissed from the clinic at the providers discretion.     Again, thank you for choosing [facility].  Our hope is that these requests will decrease the amount of time that you wait before being seen by our physicians.       _____________________________________________________________  Should you have questions after your visit to [facility], please contact our office at [phone] between the hours of 8:00 a.m. and 4:30 p.m.  Voicemails left after 4:00 p.m. will not be returned until the following business day.  For prescription refill requests, have your pharmacy contact our office and allow 72 hours.    Cancer Center Support Programs:   > Cancer Support Group  2nd Tuesday of the month 1pm-2pm, Journey Room

## 2020-11-11 NOTE — Progress Notes (Signed)
Patient is taking Revlimid as prescribed.  They have not missed any doses and report no side effects at this time.

## 2020-11-11 NOTE — Progress Notes (Signed)
CRITICAL VALUE STICKER  CRITICAL VALUE:  Potassium  RECEIVER (on-site recipient of call): Emmett RN  [place] NOTIFIED: 1405 11/11/20  MESSENGER (representative from lab): Brittney  MD NOTIFIED: Delton Coombes  TIME OF NOTIFICATION: 1406  RESPONSE:  Potassium 40 mEq PO

## 2020-11-11 NOTE — Progress Notes (Signed)
[name] being held today per order parameters for a Calcium of 7.5.  Patient states that she has been taking Calcium/Vitamin D supplements at home.  Patient given 40 mEq Potassium PO per Dr. Delton Coombes.  Discharge from clinic via wheelchair in stable condition.  Alert and oriented X 3.  Follow up with [facility] as scheduled.

## 2020-11-11 NOTE — Patient Instructions (Signed)
[place]  Discharge Instructions: Thank you for choosing [facility] to provide your oncology and hematology care.  If you have a lab appointment with the [facility], please come in thru the Main Entrance and check in at the main information desk.  Wear comfortable clothing and clothing appropriate for easy access to any Portacath or PICC line.   We strive to give you quality time with your provider. You may need to reschedule your appointment if you arrive late (15 or more minutes).  Arriving late affects you and other patients whose appointments are after yours.  Also, if you miss three or more appointments without notifying the office, you may be dismissed from the clinic at the provider's discretion.      For prescription refill requests, have your pharmacy contact our office and allow 72 hours for refills to be completed.    Today you received the following chemotherapy and/or immunotherapy agents Potassium PO      To help prevent nausea and vomiting after your treatment, we encourage you to take your nausea medication as directed.  BELOW ARE SYMPTOMS THAT SHOULD BE REPORTED IMMEDIATELY: *FEVER GREATER THAN 100.4 F (38 C) OR HIGHER *CHILLS OR SWEATING *NAUSEA AND VOMITING THAT IS NOT CONTROLLED WITH YOUR NAUSEA MEDICATION *UNUSUAL SHORTNESS OF BREATH *UNUSUAL BRUISING OR BLEEDING *URINARY PROBLEMS (pain or burning when urinating, or frequent urination) *BOWEL PROBLEMS (unusual diarrhea, constipation, pain near the anus) TENDERNESS IN MOUTH AND THROAT WITH OR WITHOUT PRESENCE OF ULCERS (sore throat, sores in mouth, or a toothache) UNUSUAL RASH, SWELLING OR PAIN  UNUSUAL VAGINAL DISCHARGE OR ITCHING   Items with * indicate a potential emergency and should be followed up as soon as possible or go to the Emergency Department if any problems should occur.  Please show the CHEMOTHERAPY ALERT CARD or IMMUNOTHERAPY ALERT CARD at check-in to the Emergency  Department and triage nurse.  Should you have questions after your visit or need to cancel or reschedule your appointment, please contact [facility] [phone]  and follow the prompts.  Office hours are 8:00 a.m. to 4:30 p.m. Monday - Friday. Please note that voicemails left after 4:00 p.m. may not be returned until the following business day.  We are closed weekends and major holidays. You have access to a nurse at all times for urgent questions. Please call the main number to the clinic [phone] and follow the prompts.  For any non-urgent questions, you may also contact your provider using MyChart. We now offer e-Visits for anyone 55 and older to request care online for non-urgent symptoms. For details visit [email or web address].   Also download the MyChart app! Go to the app store, search "MyChart", open the app, select [facility], and log in with your MyChart username and password.  Due to Covid, a mask is required upon entering the hospital/clinic. If you do not have a mask, one will be given to you upon arrival. For doctor visits, patients may have 1 support person aged 47 or older with them. For treatment visits, patients cannot have anyone with them due to current Covid guidelines and our immunocompromised population.

## 2020-11-12 LAB — KAPPA/LAMBDA LIGHT CHAINS
Kappa free light chain: 40.9 mg/L — ABNORMAL HIGH (ref 3.3–19.4)
Kappa, lambda light chain ratio: 1.82 — ABNORMAL HIGH (ref 0.26–1.65)
Lambda free light chains: 22.5 mg/L (ref 5.7–26.3)

## 2020-11-13 LAB — IMMUNOFIXATION ELECTROPHORESIS
IgA: 522 mg/dL — ABNORMAL HIGH (ref 64–422)
IgG (Immunoglobin G), Serum: 984 mg/dL (ref 586–1602)
IgM (Immunoglobulin M), Srm: 26 mg/dL (ref 26–217)
Total Protein ELP: 6.4 g/dL (ref 6.0–8.5)

## 2020-11-13 LAB — PROTEIN ELECTROPHORESIS, SERUM
A/G Ratio: 1.1 (ref 0.7–1.7)
Albumin ELP: 3.4 g/dL (ref 2.9–4.4)
Alpha-1-Globulin: 0.3 g/dL (ref 0.0–0.4)
Alpha-2-Globulin: 0.7 g/dL (ref 0.4–1.0)
Beta Globulin: 1.2 g/dL (ref 0.7–1.3)
Gamma Globulin: 0.8 g/dL (ref 0.4–1.8)
Globulin, Total: 3 g/dL (ref 2.2–3.9)
M-Spike, %: 0.2 g/dL — ABNORMAL HIGH
Total Protein ELP: 6.4 g/dL (ref 6.0–8.5)

## 2020-11-14 ENCOUNTER — Ambulatory Visit (INDEPENDENT_AMBULATORY_CARE_PROVIDER_SITE_OTHER): Payer: Medicare Other | Admitting: Orthopedic Surgery

## 2020-11-14 ENCOUNTER — Other Ambulatory Visit: Payer: Self-pay

## 2020-11-14 DIAGNOSIS — I2510 Atherosclerotic heart disease of native coronary artery without angina pectoris: Secondary | ICD-10-CM

## 2020-11-14 DIAGNOSIS — M1711 Unilateral primary osteoarthritis, right knee: Secondary | ICD-10-CM

## 2020-11-14 DIAGNOSIS — M1712 Unilateral primary osteoarthritis, left knee: Secondary | ICD-10-CM | POA: Diagnosis not present

## 2020-11-15 NOTE — Progress Notes (Signed)
Office Visit Note   Patient: Kim Mills           Date of Birth: 11/06/1940           MRN: [id number] Visit Date: 11/14/2020 Requested by: Lindell Spar, MD [address] [place],  [place] [address] PCP: Lindell Spar, MD  Subjective: Chief Complaint  Patient presents with   Left Knee - Pain   Right Knee - Pain    HPI: Kim Mills is a 80 y.o. female who presents to the office complaining of bilateral knee pain.  Patient has history of bilateral knee arthritis with last office visit in 2021.  Gel injections were discussed at that time but patient never followed up as she was subsequently diagnosed with multiple myeloma.  She is currently undergoing chemotherapy and she is in remission but she will have to be on long-term chemotherapy for this.  Being treated in [place].  She describes both knees bothering her but the left knee bothers her more.  She does wake with pain most nights and has about 1 to 2-minute walking endurance.  She takes hydrocodone for pain that she receives from her cancer doctor.  Denies any groin pain or radicular pain..                ROS: All systems reviewed are negative as they relate to the chief complaint within the history of present illness.  Patient denies fevers or chills.  Assessment & Plan: Visit Diagnoses:  1. Unilateral primary osteoarthritis, left knee   2. Unilateral primary osteoarthritis, right knee     Plan: Patient is a 80 year old female who presents for reevaluation of bilateral knee pain.  She has history of bilateral knee arthritis.  She has had cortisone injections in the past with limited response.  She had plan to try gel injections in 2021 but became more focused with her multiple myeloma diagnosis later that year, understandably so.  She would like to try these now.  Plan to preapproved her for gel injections.  Left knee cortisone injection administered today.  Patient tolerated the procedure well.  Follow-up in several  weeks after gel injection approval.  This patient is diagnosed with osteoarthritis of the knee(s).    Radiographs show evidence of joint space narrowing, osteophytes, subchondral sclerosis and/or subchondral cysts.  This patient has knee pain which interferes with functional and activities of daily living.    This patient has experienced inadequate response, adverse effects and/or intolerance with conservative treatments such as acetaminophen, NSAIDS, topical creams, physical therapy or regular exercise, knee bracing and/or weight loss.   This patient has experienced inadequate response or has a contraindication to intra articular steroid injections for at least 3 months.   This patient is not scheduled to have a total knee replacement within 6 months of starting treatment with viscosupplementation.   Follow-Up Instructions: No follow-ups on file.   Orders:  No orders of the defined types were placed in this encounter.  No orders of the defined types were placed in this encounter.     Procedures: Large Joint Inj: L knee on 11/14/2020 9:45 AM Indications: diagnostic evaluation, joint swelling and pain Details: 18 G 1.5 in needle, superolateral approach  Arthrogram: No  Medications: 5 mL lidocaine 1 %; 40 mg methylPREDNISolone acetate 40 MG/ML; 4 mL bupivacaine 0.25 % Outcome: tolerated well, no immediate complications Procedure, treatment alternatives, risks and benefits explained, specific risks discussed. Consent was given by the patient. Immediately prior  to procedure a time out was called to verify the correct patient, procedure, equipment, support staff and site/side marked as required. Patient was prepped and draped in the usual sterile fashion.      Clinical Data: No additional findings.  Objective: Vital Signs: There were no vitals taken for this visit.  Physical Exam:  Constitutional: Patient appears well-developed HEENT:  Head: Normocephalic Eyes:EOM are  normal Neck: Normal range of motion Cardiovascular: Normal rate Pulmonary/chest: Effort normal Neurologic: Patient is alert Skin: Skin is warm Psychiatric: Patient has normal mood and affect  Ortho Exam: Bilateral knees without effusion.  Left knee with 10 degree flexion contracture and 100 degrees of flexion.  Right knee with 5 degree flexion contracture and 100 agrees of flexion.  Tenderness over the medial lateral joint lines of the left knee with no significant tenderness in the right knee.  She is able to extend both legs against resistance.  No pain with hip range of motion bilaterally.  No calf tenderness.  Negative Homans' sign.  Specialty Comments:  No specialty comments available.  Imaging: No results found.   PMFS History: Patient Active Problem List   Diagnosis Date Noted   AKI (acute kidney injury) ([place]) 09/26/2020   Physical debility 09/26/2020    Class: Chronic   Acute lower UTI 09/26/2020    Class: Acute   Generalized weakness 09/24/2020   Overactive bladder 05/13/2020   Multiple myeloma ([place]) 10/13/2019   Goals of care, counseling/discussion 10/13/2019   Multiple myeloma not having achieved remission ([place]) 10/13/2019   Monoclonal gammopathy 09/12/2019   CKD (chronic kidney disease) stage 3, GFR 30-59 ml/min (HCC) 04/18/2019   Vitamin D deficiency 04/17/2019   B12 deficiency 04/12/2018   Normocytic anemia 04/12/2018   Carotid artery disease without cerebral infarction ([place]) 04/12/2018   Gait disorder 01/11/2018   Diabetic neuropathy ([place]) 01/10/2018   CAD (coronary artery disease) 11/23/2017   SVT (supraventricular tachycardia) ([place]) 10/05/2017   History of colonic polyps    GERD (gastroesophageal reflux disease) 03/13/2014   Hyperlipidemia    Type 2 diabetes mellitus (HCC)    OA (osteoarthritis) of knee    Hypothyroidism    Hypertension    Past Medical History:  Diagnosis Date   Arthritis    Cancer ([place])    Cataract    Chest pain    Associated  with weakness and fatigue   Diabetes mellitus    A1c of 7.4 in 08/2010   GERD (gastroesophageal reflux disease)    Glaucoma    Hyperlipidemia    Lipid profile in 08/2010:190, 121, 60, 118; normal CBC and CMet   Hypertension    Lipid profile in 08/2010:190, 121, 48, 118; normal CBC and CMet   Hypothyroidism    Hypothyroidism    Obesity     Family History  Problem Relation Age of Onset   Anemia Father    Arthritis Mother    Stroke Brother    Cancer Sister        unknown kind   Diabetes Sister    Dementia Sister    Cancer Niece        Breast   Cancer Brother    Healthy Son    Healthy Son    Healthy Son    Healthy Daughter    Healthy Daughter    Healthy Daughter    Sarcoidosis Daughter     Past Surgical History:  Procedure Laterality Date   CHOLECYSTECTOMY     COLONOSCOPY N/A 04/01/2014   RMR:  Melanosis coli. colonic polyps removed as described above.    CYSTOSCOPY W/ URETERAL STENT PLACEMENT Right 09/13/2019   Procedure: CYSTOSCOPY WITH RIGHT  RETROGRADE PYELOGRAM; BLADDER BIOPSY;  Surgeon: Cleon Gustin, MD;  Location: [facility];  Service: Urology;  Laterality: Right;   LEFT HEART CATH AND CORONARY ANGIOGRAPHY N/A 10/07/2017   Procedure: LEFT HEART CATH AND CORONARY ANGIOGRAPHY;  Surgeon: Troy Sine, MD;  Location: [facility];  Service: Cardiovascular;  Laterality: N/A;   THYROIDECTOMY, PARTIAL     Social History   Occupational History    Employer: RETIRED  Tobacco Use   Smoking status: Never    Passive exposure: Yes   Smokeless tobacco: Never  Vaping Use   Vaping Use: Never used  Substance and Sexual Activity   Alcohol use: Never   Drug use: Never   Sexual activity: Not Currently

## 2020-11-16 ENCOUNTER — Encounter: Payer: Self-pay | Admitting: Orthopedic Surgery

## 2020-11-16 MED ORDER — METHYLPREDNISOLONE ACETATE 40 MG/ML IJ SUSP
40.0000 mg | INTRAMUSCULAR | Status: AC | PRN
  Administered 2020-11-14: 40 mg via INTRA_ARTICULAR

## 2020-11-16 MED ORDER — BUPIVACAINE HCL 0.25 % IJ SOLN
4.0000 mL | INTRAMUSCULAR | Status: AC | PRN
  Administered 2020-11-14: 4 mL via INTRA_ARTICULAR

## 2020-11-16 MED ORDER — LIDOCAINE HCL 1 % IJ SOLN
5.0000 mL | INTRAMUSCULAR | Status: AC | PRN
  Administered 2020-11-14: 5 mL

## 2020-11-17 ENCOUNTER — Telehealth: Payer: Self-pay

## 2020-11-17 NOTE — Telephone Encounter (Signed)
Auth needed for bilat knee gel injection  

## 2020-11-17 NOTE — Telephone Encounter (Signed)
Noted  

## 2020-11-21 ENCOUNTER — Other Ambulatory Visit (HOSPITAL_COMMUNITY): Payer: Self-pay

## 2020-11-21 MED ORDER — LENALIDOMIDE 10 MG PO CAPS: 10.0000 mg | ORAL_CAPSULE | Freq: Every day | ORAL | 0 refills | Status: DC

## 2020-11-21 NOTE — Telephone Encounter (Signed)
Chart reviewed. Revlimid refilled per last office note with Dr. Katragadda.  

## 2020-11-26 ENCOUNTER — Other Ambulatory Visit (HOSPITAL_COMMUNITY): Payer: Self-pay | Admitting: Hematology

## 2020-11-26 DIAGNOSIS — M542 Cervicalgia: Secondary | ICD-10-CM

## 2020-12-08 ENCOUNTER — Other Ambulatory Visit (HOSPITAL_COMMUNITY): Payer: Self-pay | Admitting: *Deleted

## 2020-12-08 DIAGNOSIS — C9000 Multiple myeloma not having achieved remission: Secondary | ICD-10-CM

## 2020-12-08 NOTE — Progress Notes (Signed)
Kim Mills, [place] [address]   CLINIC:  Medical Oncology/Hematology  PCP:  Lindell Spar, MD [address] / [place] [address] [phone]   REASON FOR VISIT:  Follow-up for multiple myeloma & normocytic anemia  PRIOR THERAPY:   Velcade x 9 cycles from 10/29/2019 to 05/02/2020 Revlimid 2/3 weeks  NGS Results: not done  CURRENT THERAPY: surveillance  BRIEF ONCOLOGIC HISTORY:  Oncology History  Multiple myeloma not having achieved remission ([facility])  10/13/2019 Initial Diagnosis   Multiple myeloma not having achieved remission ([facility])   10/29/2019 -  Chemotherapy    Patient is on Treatment Plan: MYELOMA NON-TRANSPLANT CANDIDATES VRD WEEKLY Q21D         CANCER STAGING: Cancer Staging No matching staging information was found for the patient.  INTERVAL HISTORY:  [name], a 80 y.o. female, returns for routine follow-up of her multiple myeloma & normocytic anemia. Kim Mills was last seen on 11/11/2020.   Today she reports feeling fair. She reports dysuria and strong smelling urine for the past week. She is able to walk short distance with the assistance of a walker and denies any recent falls. She denies leg swellings, fevers, chills, and n/v/d. She is taking calcium BID. Her appetite is fair.  REVIEW OF SYSTEMS:  Review of Systems  Constitutional:  Positive for fatigue (depleted). Negative for appetite change (50%), chills and fever.  Cardiovascular:  Negative for leg swelling.  Gastrointestinal:  Negative for diarrhea, nausea and vomiting.  Genitourinary:  Positive for dysuria.   Neurological:  Positive for dizziness, headaches and numbness.  All other systems reviewed and are negative.  PAST MEDICAL/SURGICAL HISTORY:  Past Medical History:  Diagnosis Date   Arthritis    Cancer [facility])    Cataract    Chest pain    Associated with weakness and fatigue   Diabetes mellitus    A1c of 7.4 in 08/2010   GERD  (gastroesophageal reflux disease)    Glaucoma    Hyperlipidemia    Lipid profile in 08/2010:190, 121, 48, 118; normal CBC and CMet   Hypertension    Lipid profile in 08/2010:190, 121, 48, 118; normal CBC and CMet   Hypothyroidism    Hypothyroidism    Obesity    Past Surgical History:  Procedure Laterality Date   CHOLECYSTECTOMY     COLONOSCOPY N/A 04/01/2014   RMR: Melanosis coli. colonic polyps removed as described above.    CYSTOSCOPY W/ URETERAL STENT PLACEMENT Right 09/13/2019   Procedure: CYSTOSCOPY WITH RIGHT  RETROGRADE PYELOGRAM; BLADDER BIOPSY;  Surgeon: Cleon Gustin, MD;  Location: [facility];  Service: Urology;  Laterality: Right;   LEFT HEART CATH AND CORONARY ANGIOGRAPHY N/A 10/07/2017   Procedure: LEFT HEART CATH AND CORONARY ANGIOGRAPHY;  Surgeon: Troy Sine, MD;  Location: [facility];  Service: Cardiovascular;  Laterality: N/A;   THYROIDECTOMY, PARTIAL      SOCIAL HISTORY:  Social History   Socioeconomic History   Marital status: Widowed    Spouse name: Not on file   Number of children: 7   Years of education: Not on file   Highest education level: Not on file  Occupational History    Employer: RETIRED  Tobacco Use   Smoking status: Never    Passive exposure: Yes   Smokeless tobacco: Never  Vaping Use   Vaping Use: Never used  Substance and Sexual Activity   Alcohol use: Never   Drug use: Never  Sexual activity: Not Currently  Other Topics Concern   Not on file  Social History Narrative   Daughter, Kim Mills, lives with and assists patient.    Social Determinants of Health   Financial Resource Strain: Low Risk    Difficulty of Paying Living Expenses: Not hard at all  Food Insecurity: No Food Insecurity   Worried About Charity fundraiser in the Last Year: Never true   [place] in the Last Year: Never true  Transportation Needs: No Transportation Needs   Lack of Transportation (Medical): No   Lack of Transportation (Non-Medical): No   Physical Activity: Sufficiently Active   Days of Exercise per Week: 5 days   Minutes of Exercise per Session: 30 min  Stress: No Stress Concern Present   Feeling of Stress : Not at all  Social Connections: Moderately Integrated   Frequency of Communication with Friends and Family: More than three times a week   Frequency of Social Gatherings with Friends and Family: More than three times a week   Attends Religious Services: 1 to 4 times per year   Active Member of [employer] or Organizations: Yes   Attends Archivist Meetings: 1 to 4 times per year   Marital Status: Widowed  Human resources officer Violence: Not At Risk   Fear of Current or Ex-Partner: No   Emotionally Abused: No   Physically Abused: No   Sexually Abused: No    FAMILY HISTORY:  Family History  Problem Relation Age of Onset   Anemia Father    Arthritis Mother    Stroke Brother    Cancer Sister        unknown kind   Diabetes Sister    Dementia Sister    Cancer Niece        Breast   Cancer Brother    Healthy Son    Healthy Son    Healthy Son    Healthy Daughter    Healthy Daughter    Healthy Daughter    Sarcoidosis Daughter     CURRENT MEDICATIONS:  Current Outpatient Medications  Medication Sig Dispense Refill   acyclovir (ZOVIRAX) 400 MG tablet Take 1 tablet (400 mg total) by mouth 2 (two) times daily. 60 tablet 6   ASPIRIN 81 PO Take 1 tablet by mouth daily.     blood glucose meter kit and supplies Dispense based on patient and insurance preference. Use up to four times daily as directed. (FOR ICD-10 E10.9, E11.9). 1 each 0   Blood Glucose Monitoring Suppl (BLOOD GLUCOSE SYSTEM PAK) KIT Please dispense based on patient and insurance preference. Use as directed to monitor FSBS 2x daily. Dx: E11.9. 1 each 1   Calcium 500 MG tablet Take 600 mg by mouth 2 (two) times daily.     fluticasone (FLONASE) 50 MCG/ACT nasal spray USE TWO SPRAYS IN BOTH NOSTRILS DAILY (Patient taking differently: Place 2 sprays  into both nostrils daily.) 16 g 6   gabapentin (NEURONTIN) 300 MG capsule TAKE ONE CAPSULE BY MOUTH EVERY NIGHT AT BEDTIME 90 capsule 3   glipiZIDE (GLUCOTROL) 5 MG tablet TAKE ONE TABLET (5MG TOTAL) BY MOUTH TWOTIMES DAILY (Patient taking differently: Take 5 mg by mouth 2 (two) times daily before a meal.) 180 tablet 3   Glucose Blood (BLOOD GLUCOSE TEST STRIPS) STRP Please dispense based on patient and insurance preference. Use as directed to monitor FSBS 2x daily. Dx: E11.9. 100 each 11   HM LORATADINE 10 MG tablet TAKE ONE  TABLET (10MG TOTAL) BY MOUTH DAILY (Patient taking differently: Take 10 mg by mouth daily as needed for allergies.) 30 tablet 0   HYDROcodone-acetaminophen (NORCO/VICODIN) 5-325 MG tablet TAKE ONE (1) TABLET BY MOUTH EVERY 6 HOURS AS NEEDED FOR MODERATE PAIN OR SEVERE PAIN 60 tablet 0   Lancets MISC Please dispense based on patient and insurance preference. Use as directed to monitor FSBS 2x daily. Dx: E11.9. 100 each 11   lenalidomide (REVLIMID) 10 MG capsule Take 1 capsule (10 mg total) by mouth daily. 14 days on, 7 days off 14 capsule 0   magic mouthwash w/lidocaine SOLN Take 5 mLs by mouth 4 (four) times daily. 450 mL 0   magnesium oxide (MAG-OX) 400 (240 Mg) MG tablet TAKE ONE TABLET (400MG TOTAL) BY MOUTH TWO TIMES DAILY (Patient taking differently: Take 400 mg by mouth 2 (two) times daily.) 120 tablet 1   meclizine (ANTIVERT) 25 MG tablet Take 25 mg by mouth every 6 (six) hours as needed for dizziness.  (Patient not taking: Reported on 11/11/2020)     metFORMIN (GLUCOPHAGE) 1000 MG tablet TAKE ONE TABLET BY MOUTH TWICE A DAY 60 tablet 3   metoprolol tartrate (LOPRESSOR) 25 MG tablet TAKE ONE-HALF TABLET (12.5MG TOTAL) BY MOUTH TWO TIMES DAILY (Patient taking differently: Take 12.5 mg by mouth 2 (two) times daily.) 90 tablet 3   omeprazole (PRILOSEC) 40 MG capsule TAKE ONE CAPSULE BY MOUTH DAILY 90 capsule 3   polyethylene glycol (MIRALAX / GLYCOLAX) 17 g packet Take 17 g by  mouth daily. 14 each 0   potassium chloride (KLOR-CON) 10 MEQ tablet Take 2 tablets (20 mEq total) by mouth daily. 60 tablet 2   simvastatin (ZOCOR) 40 MG tablet TAKE ONE TABLET (40MG TOTAL) BY MOUTH BEDTIME (Patient taking differently: Take 40 mg by mouth at bedtime.) 90 tablet 3   SYNTHROID 137 MCG tablet TAKE ONE TABLET (137MCG TOTAL) BY MOUTH DAILY BEFORE BREAKFAST (Patient taking differently: Take 137 mcg by mouth daily before breakfast.) 30 tablet 0   traMADol (ULTRAM) 50 MG tablet TAKE ONE TABLET (50MG TOTAL) BY MOUTH TWO TIMES DAILY AS NEEDED (Patient not taking: Reported on 11/11/2020) 60 tablet 0   zinc oxide (MEIJER ZINC OXIDE) 20 % ointment Apply 1 application topically as needed for irritation. (Patient not taking: Reported on 11/11/2020) 56.7 g 0   No current facility-administered medications for this visit.    ALLERGIES:  No Known Allergies  PHYSICAL EXAM:  Performance status (ECOG): 3 - Symptomatic, >50% confined to bed  There were no vitals filed for this visit. Wt Readings from Last 3 Encounters:  11/11/20 160 lb 9.6 oz (72.8 kg)  11/05/20 156 lb (70.8 kg)  10/14/20 156 lb 1.6 oz (70.8 kg)   Physical Exam Vitals reviewed.  Constitutional:      Appearance: Normal appearance.     Comments: In wheelchair  Cardiovascular:     Rate and Rhythm: Normal rate and regular rhythm.     Pulses: Normal pulses.     Heart sounds: Normal heart sounds.  Pulmonary:     Effort: Pulmonary effort is normal.     Breath sounds: Normal breath sounds.  Neurological:     General: No focal deficit present.     Mental Status: She is alert and oriented to person, place, and time.  Psychiatric:        Mood and Affect: Mood normal.        Behavior: Behavior normal.     LABORATORY DATA:  I have reviewed the labs as listed.  CBC Latest Ref Rng & Units 11/11/2020 10/14/2020 09/26/2020  WBC 4.0 - 10.5 K/uL 6.3 7.0 8.2  Hemoglobin 12.0 - 15.0 g/dL 10.4(L) 9.9(L) 9.9(L)  Hematocrit 36.0 - 46.0 %  33.8(L) 31.9(L) 32.1(L)  Platelets 150 - 400 K/uL 208 221 127(L)   CMP Latest Ref Rng & Units 11/11/2020 10/14/2020 09/29/2020  Glucose 70 - 99 mg/dL 130(H) 104(H) 123(H)  BUN 8 - 23 mg/dL 10 23 10   Creatinine 0.44 - 1.00 mg/dL 0.59 1.08(H) 0.82  Sodium 135 - 145 mmol/L 142 140 136  Potassium 3.5 - 5.1 mmol/L 2.7(LL) 3.6 3.9  Chloride 98 - 111 mmol/L 108 105 108  CO2 22 - 32 mmol/L 26 26 22   Calcium 8.9 - 10.3 mg/dL 7.5(L) 7.4(L) 7.1(L)  Total Protein 6.5 - 8.1 g/dL 6.7 6.5 5.4(L)  Total Bilirubin 0.3 - 1.2 mg/dL 1.0 0.8 0.7  Alkaline Phos 38 - 126 U/L 64 54 50  AST 15 - 41 U/L 20 16 36  ALT 0 - 44 U/L 23 15 19     DIAGNOSTIC IMAGING:  I have independently reviewed the scans and discussed with the patient. No results found.   ASSESSMENT:  1.  Normocytic anemia: -Patient seen at the request of Dr. Buelah Manis for further work-up and management of normocytic anemia. -Recent CBC on 08/21/2019 shows hemoglobin 6.9 with MCV of 88.3.  White count and platelets are normal.  No history of CKD.  Denies any bleeding per rectum or melena. Last colonoscopy on 04/01/2014 shows diffusely pigmented rectal mucosa consistent with melanosis [name]  Diffusely pigmented colonic mucosa, 2 diminutive polyps in the mid ascending segment, otherwise the remainder of the colon mucosa was normal.  Biopsy consistent with tubular adenoma. -CT renal study on 05/11/2019 shows normal-sized spleen with normal liver.  No other abnormal adenopathy. -Denies any prior history of blood transfusion.  She is currently taking iron tablet twice daily.   2.  IgG lambda plasma cell myeloma: -Skeletal survey on 09/12/2019 shows diffuse faint lucencies in the skull, bilateral femurs. -Bone marrow biopsy on 09/27/2019 shows 70% plasma cells in hypercellular marrow.  Chromosome analysis was 95, XX. FISH panel was normal.  However quality of specimen is compromised. -LDH normal, beta-2 microglobulin 2.9.  M spike was 2.1 g.   Kappa light chains 19.7, lambda light chains 14.3 with ratio of 1.38. -24-hour urine shows nonnephrotic range proteinuria.  Urine immunofixation was positive. - 9 cycles of RVD from 10/29/2019 through 05/02/2020. - Because of her age and comorbidities, she was not considered a candidate for bone marrow transplant.   PLAN:  1.  IgG lambda plasma cell myeloma: - Reviewed myeloma labs from 11/11/2020.  M spike is 0.2 g.  Free light chain ratio is 1.82 with kappa light chains 40.9.  Immunofixation was positive for IgG lambda. - Reviewed her labs today which showed normal LFTs and CBC. - Continue Revlimid 10 mg 3 weeks on/1 week off. - She denies any GI symptoms.  She reports dysuria for the last 1 week. - We will check UA which showed large amount of leukocytes and many bacteria.  We will proceed with Cipro 500 mg twice daily for 10 days. - RTC 6 weeks for follow-up with repeat labs.   2.  Diabetes: - Continue glipizide and metformin.   3.  Hypertension: - Continue amlodipine and HCTZ.   4.  Weight loss: - She has lost some weight since May.  However she  is eating better and is not losing weight anymore.   5.  Diarrhea: - Diarrhea has improved since Revlimid dose reduced.  Continue Imodium as needed.   6.  Bilateral knee pains: - Continue hydrocodone 5/325 twice daily as needed. - Continue follow-up with orthopedics.   7.  Infection prophylaxis: - Continue acyclovir 400 mg twice daily.  Continue aspirin 81 mg daily.   8.  Bone protection: - Denosumab was held last few times due to hypocalcemia. - She is taking calcium 600 mg 2-3 times daily.  She often forgets the third dose. - Calcium today is 8.6 with albumin 3.6.  She may proceed with denosumab today.  9.  Hypokalemia: - Continue potassium 20 mEq daily.  Potassium today is 3.5.   Orders placed this encounter:  No orders of the defined types were placed in this encounter.    Derek Jack, MD [place] [phone]   I, Thana Ates, am acting as a scribe for Dr. Derek Jack.  I, Derek Jack MD, have reviewed the above documentation for accuracy and completeness, and I agree with the above.

## 2020-12-09 ENCOUNTER — Inpatient Hospital Stay (HOSPITAL_COMMUNITY): Payer: Medicare Other | Attending: Hematology

## 2020-12-09 ENCOUNTER — Inpatient Hospital Stay (HOSPITAL_COMMUNITY): Payer: Medicare Other

## 2020-12-09 ENCOUNTER — Other Ambulatory Visit: Payer: Self-pay

## 2020-12-09 ENCOUNTER — Inpatient Hospital Stay (HOSPITAL_BASED_OUTPATIENT_CLINIC_OR_DEPARTMENT_OTHER): Payer: Medicare Other | Admitting: Hematology

## 2020-12-09 ENCOUNTER — Other Ambulatory Visit (HOSPITAL_COMMUNITY): Payer: Self-pay

## 2020-12-09 VITALS — BP 140/69 | HR 46 | Temp 98.8°F | Resp 18

## 2020-12-09 DIAGNOSIS — C9000 Multiple myeloma not having achieved remission: Secondary | ICD-10-CM

## 2020-12-09 DIAGNOSIS — L750 Bromhidrosis: Secondary | ICD-10-CM | POA: Diagnosis not present

## 2020-12-09 DIAGNOSIS — D649 Anemia, unspecified: Secondary | ICD-10-CM

## 2020-12-09 DIAGNOSIS — Z23 Encounter for immunization: Secondary | ICD-10-CM | POA: Diagnosis not present

## 2020-12-09 LAB — CBC WITH DIFFERENTIAL/PLATELET
Abs Immature Granulocytes: 0.07 10*3/uL (ref 0.00–0.07)
Basophils Absolute: 0.1 10*3/uL (ref 0.0–0.1)
Basophils Relative: 1 %
Eosinophils Absolute: 0.4 10*3/uL (ref 0.0–0.5)
Eosinophils Relative: 6 %
HCT: 34.5 % — ABNORMAL LOW (ref 36.0–46.0)
Hemoglobin: 10.9 g/dL — ABNORMAL LOW (ref 12.0–15.0)
Immature Granulocytes: 1 %
Lymphocytes Relative: 36 %
Lymphs Abs: 2.5 10*3/uL (ref 0.7–4.0)
MCH: 29.7 pg (ref 26.0–34.0)
MCHC: 31.6 g/dL (ref 30.0–36.0)
MCV: 94 fL (ref 80.0–100.0)
Monocytes Absolute: 0.3 10*3/uL (ref 0.1–1.0)
Monocytes Relative: 5 %
Neutro Abs: 3.5 10*3/uL (ref 1.7–7.7)
Neutrophils Relative %: 51 %
Platelets: 271 10*3/uL (ref 150–400)
RBC: 3.67 MIL/uL — ABNORMAL LOW (ref 3.87–5.11)
RDW: 18.2 % — ABNORMAL HIGH (ref 11.5–15.5)
WBC: 6.8 10*3/uL (ref 4.0–10.5)
nRBC: 0 % (ref 0.0–0.2)

## 2020-12-09 LAB — COMPREHENSIVE METABOLIC PANEL
ALT: 36 U/L (ref 0–44)
AST: 36 U/L (ref 15–41)
Albumin: 3.6 g/dL (ref 3.5–5.0)
Alkaline Phosphatase: 46 U/L (ref 38–126)
Anion gap: 8 (ref 5–15)
BUN: 12 mg/dL (ref 8–23)
CO2: 24 mmol/L (ref 22–32)
Calcium: 8.6 mg/dL — ABNORMAL LOW (ref 8.9–10.3)
Chloride: 107 mmol/L (ref 98–111)
Creatinine, Ser: 0.84 mg/dL (ref 0.44–1.00)
GFR, Estimated: >60 mL/min (ref >60)
Glucose, Bld: 174 mg/dL — ABNORMAL HIGH (ref 70–99)
Potassium: 3.5 mmol/L (ref 3.5–5.1)
Sodium: 139 mmol/L (ref 135–145)
Total Bilirubin: 0.8 mg/dL (ref 0.3–1.2)
Total Protein: 6.7 g/dL (ref 6.5–8.1)

## 2020-12-09 LAB — URINALYSIS, ROUTINE W REFLEX MICROSCOPIC
Bilirubin Urine: NEGATIVE
Glucose, UA: NEGATIVE mg/dL
Ketones, ur: NEGATIVE mg/dL
Nitrite: NEGATIVE
Protein, ur: 30 mg/dL — AB
Specific Gravity, Urine: 1.006 (ref 1.005–1.030)
WBC, UA: >50 WBC/hpf — ABNORMAL HIGH (ref 0–5)
pH: 6 (ref 5.0–8.0)

## 2020-12-09 LAB — LACTATE DEHYDROGENASE: LDH: 150 U/L (ref 98–192)

## 2020-12-09 MED ORDER — DENOSUMAB 120 MG/1.7ML ~~LOC~~ SOLN
120.0000 mg | Freq: Once | SUBCUTANEOUS | Status: AC
  Administered 2020-12-09: 120 mg via SUBCUTANEOUS
  Filled 2020-12-09: qty 1.7

## 2020-12-09 MED ORDER — CIPROFLOXACIN HCL 500 MG PO TABS: 500.0000 mg | ORAL_TABLET | Freq: Two times a day (BID) | ORAL | 0 refills | Status: DC

## 2020-12-09 MED ORDER — LENALIDOMIDE 10 MG PO CAPS: 10.0000 mg | ORAL_CAPSULE | Freq: Every day | ORAL | 0 refills | Status: DC

## 2020-12-09 NOTE — Progress Notes (Signed)
Patient has been assessed, vital signs and labs have been reviewed by Dr. Delton Coombes. ANC, Creatinine, LFTs, and Platelets are within treatment parameters per Dr. Delton Coombes. The patient is good to proceed with Xgeva injection at this time.  Primary RN and pharmacy aware. Patient is collecting urine to check for UTI per Dr. Delton Coombes urinalysis with microscopic reflex ordered.

## 2020-12-09 NOTE — Patient Instructions (Signed)
[place] at [facility] Discharge Instructions  You were seen and examined today by Dr. Delton Coombes. He reviewed your most recent labs and everything looks stable. He wants you to continue taking the calcium and Revlimid as prescribed. You received your Xgeva injection today. Please keep follow up as scheduled.   Thank you for choosing [place] at [facility] to provide your oncology and hematology care.  To afford each patient quality time with our provider, please arrive at least 15 minutes before your scheduled appointment time.   If you have a lab appointment with the [facility] please come in thru the Main Entrance and check in at the main information desk.  You need to re-schedule your appointment should you arrive 10 or more minutes late.  We strive to give you quality time with our providers, and arriving late affects you and other patients whose appointments are after yours.  Also, if you no show three or more times for appointments you may be dismissed from the clinic at the providers discretion.     Again, thank you for choosing [facility].  Our hope is that these requests will decrease the amount of time that you wait before being seen by our physicians.       _____________________________________________________________  Should you have questions after your visit to [facility], please contact our office at [phone] and follow the prompts.  Our office hours are 8:00 a.m. and 4:30 p.m. Monday - Friday.  Please note that voicemails left after 4:00 p.m. may not be returned until the following business day.  We are closed weekends and major holidays.  You do have access to a nurse 24-7, just call the main number to the clinic [phone] and do not press any options, hold on the line and a nurse will answer the phone.    For prescription refill requests, have your pharmacy contact our office and allow 72  hours.    Due to Covid, you will need to wear a mask upon entering the hospital. If you do not have a mask, a mask will be given to you at the Main Entrance upon arrival. For doctor visits, patients may have 1 support person age 20 or older with them. For treatment visits, patients can not have anyone with them due to social distancing guidelines and our immunocompromised population.

## 2020-12-09 NOTE — Progress Notes (Signed)
Kim Mills presents today for injection per the provider's orders.  Xgeva  administration without incident; injection site WNL; see MAR for injection details. Calcium 8.6 pt takes Calcium/Vit D as directed. Pt denies any tooth or jaw pain and  pt has no recent or future dental appointments at this time.  Patient tolerated procedure well and without incident.  No questions or complaints noted at this time.   [name] given today per MD orders. Tolerated infusion without adverse affects. Vital signs stable. No complaints at this time. Discharged from clinic via wheelchair in stable condition. Alert and oriented x 3. F/U with [facility] as scheduled.

## 2020-12-09 NOTE — Progress Notes (Signed)
Patient is taking Revlimid as prescribed.  Pt has not missed any doses and reports no side effects at this time.

## 2020-12-09 NOTE — Patient Instructions (Signed)
[facility]  Discharge Instructions: Thank you for choosing [facility] to provide your oncology and hematology care.  If you have a lab appointment with the [facility], please come in thru the Main Entrance and check in at the main information desk.  Wear comfortable clothing and clothing appropriate for easy access to any Portacath or PICC line.   We strive to give you quality time with your provider. You may need to reschedule your appointment if you arrive late (15 or more minutes).  Arriving late affects you and other patients whose appointments are after yours.  Also, if you miss three or more appointments without notifying the office, you may be dismissed from the clinic at the provider's discretion.      For prescription refill requests, have your pharmacy contact our office and allow 72 hours for refills to be completed.    Today you received Xgeva injection.    BELOW ARE SYMPTOMS THAT SHOULD BE REPORTED IMMEDIATELY: *FEVER GREATER THAN 100.4 F (38 C) OR HIGHER *CHILLS OR SWEATING *NAUSEA AND VOMITING THAT IS NOT CONTROLLED WITH YOUR NAUSEA MEDICATION *UNUSUAL SHORTNESS OF BREATH *UNUSUAL BRUISING OR BLEEDING *URINARY PROBLEMS (pain or burning when urinating, or frequent urination) *BOWEL PROBLEMS (unusual diarrhea, constipation, pain near the anus) TENDERNESS IN MOUTH AND THROAT WITH OR WITHOUT PRESENCE OF ULCERS (sore throat, sores in mouth, or a toothache) UNUSUAL RASH, SWELLING OR PAIN  UNUSUAL VAGINAL DISCHARGE OR ITCHING   Items with * indicate a potential emergency and should be followed up as soon as possible or go to the Emergency Department if any problems should occur.  Please show the CHEMOTHERAPY ALERT CARD or IMMUNOTHERAPY ALERT CARD at check-in to the Emergency Department and triage nurse.  Should you have questions after your visit or need to cancel or reschedule your appointment, please contact [facility] [phone]   and follow the prompts.  Office hours are 8:00 a.m. to 4:30 p.m. Monday - Friday. Please note that voicemails left after 4:00 p.m. may not be returned until the following business day.  We are closed weekends and major holidays. You have access to a nurse at all times for urgent questions. Please call the main number to the clinic [phone] and follow the prompts.  For any non-urgent questions, you may also contact your provider using MyChart. We now offer e-Visits for anyone 37 and older to request care online for non-urgent symptoms. For details visit [email or web address].   Also download the MyChart app! Go to the app store, search "MyChart", open the app, select [facility], and log in with your MyChart username and password.  Due to Covid, a mask is required upon entering the hospital/clinic. If you do not have a mask, one will be given to you upon arrival. For doctor visits, patients may have 1 support person aged 78 or older with them. For treatment visits, patients cannot have anyone with them due to current Covid guidelines and our immunocompromised population.

## 2020-12-09 NOTE — Telephone Encounter (Signed)
Chart reviewed. Revlimid refilled per last office note with Dr. Katragadda.  

## 2020-12-10 ENCOUNTER — Other Ambulatory Visit (HOSPITAL_COMMUNITY): Payer: Self-pay | Admitting: Hematology

## 2020-12-10 ENCOUNTER — Other Ambulatory Visit: Payer: Self-pay | Admitting: Internal Medicine

## 2020-12-10 ENCOUNTER — Encounter (HOSPITAL_COMMUNITY): Payer: Self-pay

## 2020-12-10 DIAGNOSIS — C9000 Multiple myeloma not having achieved remission: Secondary | ICD-10-CM

## 2020-12-10 LAB — KAPPA/LAMBDA LIGHT CHAINS
Kappa free light chain: 48.5 mg/L — ABNORMAL HIGH (ref 3.3–19.4)
Kappa, lambda light chain ratio: 1.63 (ref 0.26–1.65)
Lambda free light chains: 29.7 mg/L — ABNORMAL HIGH (ref 5.7–26.3)

## 2020-12-10 NOTE — Progress Notes (Signed)
Patient's daughter Iris aware that Dr. Delton Coombes sent in [place] to the pharmacy for patient to take for 5 days.

## 2020-12-12 LAB — PROTEIN ELECTROPHORESIS, SERUM
A/G Ratio: 1.3 (ref 0.7–1.7)
Albumin ELP: 3.3 g/dL (ref 2.9–4.4)
Alpha-1-Globulin: 0.2 g/dL (ref 0.0–0.4)
Alpha-2-Globulin: 0.6 g/dL (ref 0.4–1.0)
Beta Globulin: 1.1 g/dL (ref 0.7–1.3)
Gamma Globulin: 0.7 g/dL (ref 0.4–1.8)
Globulin, Total: 2.6 g/dL (ref 2.2–3.9)
M-Spike, %: 0.2 g/dL — ABNORMAL HIGH
Total Protein ELP: 5.9 g/dL — ABNORMAL LOW (ref 6.0–8.5)

## 2020-12-15 LAB — IMMUNOFIXATION ELECTROPHORESIS
IgA: 470 mg/dL — ABNORMAL HIGH (ref 64–422)
IgG (Immunoglobin G), Serum: 826 mg/dL (ref 586–1602)
IgM (Immunoglobulin M), Srm: 24 mg/dL — ABNORMAL LOW (ref 26–217)
Total Protein ELP: 6.3 g/dL (ref 6.0–8.5)

## 2020-12-25 ENCOUNTER — Ambulatory Visit: Payer: Medicare Other | Admitting: Internal Medicine

## 2020-12-29 ENCOUNTER — Other Ambulatory Visit (HOSPITAL_COMMUNITY): Payer: Self-pay

## 2020-12-29 MED ORDER — LENALIDOMIDE 10 MG PO CAPS: 10.0000 mg | ORAL_CAPSULE | Freq: Every day | ORAL | 0 refills | Status: DC

## 2020-12-29 NOTE — Telephone Encounter (Signed)
Chart reviewed. Revlimid refilled per Dr. Delton Coombes.

## 2020-12-31 ENCOUNTER — Telehealth: Payer: Self-pay

## 2020-12-31 NOTE — Telephone Encounter (Signed)
VOB submitted for Monovisc, bilateral knee. BV pending 

## 2021-01-02 ENCOUNTER — Other Ambulatory Visit (HOSPITAL_COMMUNITY): Payer: Self-pay | Admitting: Hematology

## 2021-01-02 DIAGNOSIS — M542 Cervicalgia: Secondary | ICD-10-CM

## 2021-01-05 ENCOUNTER — Encounter (HOSPITAL_COMMUNITY): Payer: Self-pay | Admitting: Hematology

## 2021-01-05 ENCOUNTER — Telehealth: Payer: Self-pay

## 2021-01-05 NOTE — Telephone Encounter (Signed)
Called and left a VM advising patient to CB to schedule with Dr. Marlou Sa for gel injection.  Approved for Monovisc, bilateral knee. [place] deductible has been met Secondary insurance will pick up remaining eligible expenses at 100%. No Co-pay No PA required

## 2021-01-14 ENCOUNTER — Ambulatory Visit (INDEPENDENT_AMBULATORY_CARE_PROVIDER_SITE_OTHER): Payer: Medicare Other | Admitting: Internal Medicine

## 2021-01-14 ENCOUNTER — Encounter: Payer: Self-pay | Admitting: Internal Medicine

## 2021-01-14 ENCOUNTER — Encounter (INDEPENDENT_AMBULATORY_CARE_PROVIDER_SITE_OTHER): Payer: Self-pay

## 2021-01-14 ENCOUNTER — Other Ambulatory Visit: Payer: Self-pay

## 2021-01-14 DIAGNOSIS — J209 Acute bronchitis, unspecified: Secondary | ICD-10-CM | POA: Insufficient documentation

## 2021-01-14 MED ORDER — METHYLPREDNISOLONE 4 MG PO TBPK: ORAL_TABLET | ORAL | 0 refills | Status: DC

## 2021-01-14 MED ORDER — GUAIFENESIN-DM 100-10 MG/5ML PO SYRP: 5.0000 mL | ORAL_SOLUTION | ORAL | 0 refills | Status: DC | PRN

## 2021-01-14 MED ORDER — ALBUTEROL SULFATE HFA 108 (90 BASE) MCG/ACT IN AERS: 2.0000 | INHALATION_SPRAY | Freq: Four times a day (QID) | RESPIRATORY_TRACT | 0 refills | Status: DC | PRN

## 2021-01-14 MED ORDER — AZITHROMYCIN 250 MG PO TABS: ORAL_TABLET | ORAL | 0 refills | Status: AC

## 2021-01-14 NOTE — Progress Notes (Signed)
Virtual Visit via Telephone Note   This visit type was conducted due to national recommendations for restrictions regarding the COVID-19 Pandemic (e.g. social distancing) in an effort to limit this patient's exposure and mitigate transmission in our community.  Due to her co-morbid illnesses, this patient is at least at moderate risk for complications without adequate follow up.  This format is felt to be most appropriate for this patient at this time.  The patient did not have access to video technology/had technical difficulties with video requiring transitioning to audio format only (telephone).  All issues noted in this document were discussed and addressed.  No physical exam could be performed with this format.  Evaluation Performed:  Follow-up visit  Date:  01/14/2021   ID:  Kim, Mills [id number], MRN [id number]  Patient Location: Home Provider Location: Office/Clinic  Participants: Patient and daughter - Kim Mills Location of Patient: Home Location of Provider: Telehealth Consent was obtain for visit to be over via telehealth. I verified that I am speaking with the correct person using two identifiers.  PCP:  Kim Spar, MD   Chief Complaint: Cough, runny nose, sore throat and fatigue  History of Present Illness:    Kim Mills is a 80 y.o. female who has a televisit for complaint of cough that started about 2 weeks ago.  Cough has been dry and getting worse and is worse at nighttime.  She also complains of sore throat, runny nose, myalgias and fatigue for about a week now.  Daughter also reports mild wheezing at times.  She has tried NyQuil, Alka-Seltzer and throat lozenges so far.  Denies any fever or chills currently.  The patient does not have symptoms concerning for COVID-19 infection (fever, chills, cough, or new shortness of breath).   Past Medical, Surgical, Social History, Allergies, and Medications have been Reviewed.  Past Medical History:   Diagnosis Date   Arthritis    Cancer [facility])    Cataract    Chest pain    Associated with weakness and fatigue   Diabetes mellitus    A1c of 7.4 in 08/2010   GERD (gastroesophageal reflux disease)    Glaucoma    Hyperlipidemia    Lipid profile in 08/2010:190, 121, 48, 118; normal CBC and CMet   Hypertension    Lipid profile in 08/2010:190, 121, 48, 118; normal CBC and CMet   Hypothyroidism    Hypothyroidism    Obesity    Past Surgical History:  Procedure Laterality Date   CHOLECYSTECTOMY     COLONOSCOPY N/A 04/01/2014   RMR: Melanosis coli. colonic polyps removed as described above.    CYSTOSCOPY W/ URETERAL STENT PLACEMENT Right 09/13/2019   Procedure: CYSTOSCOPY WITH RIGHT  RETROGRADE PYELOGRAM; BLADDER BIOPSY;  Surgeon: Kim Gustin, MD;  Location: [facility];  Service: Urology;  Laterality: Right;   LEFT HEART CATH AND CORONARY ANGIOGRAPHY N/A 10/07/2017   Procedure: LEFT HEART CATH AND CORONARY ANGIOGRAPHY;  Surgeon: Kim Sine, MD;  Location: [facility];  Service: Cardiovascular;  Laterality: N/A;   THYROIDECTOMY, PARTIAL       Current Meds  Medication Sig   acyclovir (ZOVIRAX) 400 MG tablet TAKE ONE TABLET (400MG TOTAL) BY MOUTH TWICE DAILY   albuterol (VENTOLIN HFA) 108 (90 Base) MCG/ACT inhaler Inhale 2 puffs into the lungs every 6 (six) hours as needed for wheezing or shortness of breath.   ASPIRIN 81 PO Take 1 tablet by mouth daily.   azithromycin (  ZITHROMAX) 250 MG tablet Take 2 tablets on day 1, then 1 tablet daily on days 2 through 5   blood glucose meter kit and supplies Dispense based on patient and insurance preference. Use up to four times daily as directed. (FOR ICD-10 E10.9, E11.9).   Blood Glucose Monitoring Suppl (BLOOD GLUCOSE SYSTEM PAK) KIT Please dispense based on patient and insurance preference. Use as directed to monitor FSBS 2x daily. Dx: E11.9.   Calcium 500 MG tablet Take 600 mg by mouth 2 (two) times daily.   fluticasone (FLONASE) 50  MCG/ACT nasal spray USE TWO SPRAYS IN BOTH NOSTRILS DAILY (Patient taking differently: Place 2 sprays into both nostrils daily.)   gabapentin (NEURONTIN) 300 MG capsule TAKE ONE CAPSULE BY MOUTH EVERY NIGHT AT BEDTIME   glipiZIDE (GLUCOTROL) 5 MG tablet TAKE ONE TABLET (5MG TOTAL) BY MOUTH TWOTIMES DAILY (Patient taking differently: Take 5 mg by mouth 2 (two) times daily before a meal.)   Glucose Blood (BLOOD GLUCOSE TEST STRIPS) STRP Please dispense based on patient and insurance preference. Use as directed to monitor FSBS 2x daily. Dx: E11.9.   guaiFENesin-dextromethorphan (ROBITUSSIN DM) 100-10 MG/5ML syrup Take 5 mLs by mouth every 4 (four) hours as needed for cough.   HM LORATADINE 10 MG tablet TAKE ONE TABLET (10MG TOTAL) BY MOUTH DAILY (Patient taking differently: Take 10 mg by mouth daily as needed for allergies.)   HYDROcodone-acetaminophen (NORCO/VICODIN) 5-325 MG tablet TAKE ONE (1) TABLET BY MOUTH EVERY 6 HOURS AS NEEDED FOR MODERATE PAIN OR SEVERE PAIN   Lancets MISC Please dispense based on patient and insurance preference. Use as directed to monitor FSBS 2x daily. Dx: E11.9.   lenalidomide (REVLIMID) 10 MG capsule Take 1 capsule (10 mg total) by mouth daily. 14 days on, 7 days off   magic mouthwash w/lidocaine SOLN Take 5 mLs by mouth 4 (four) times daily.   magnesium oxide (MAG-OX) 400 (240 Mg) MG tablet TAKE ONE TABLET (400MG TOTAL) BY MOUTH TWO TIMES DAILY (Patient taking differently: Take 400 mg by mouth 2 (two) times daily.)   meclizine (ANTIVERT) 25 MG tablet Take 25 mg by mouth every 6 (six) hours as needed for dizziness.   metFORMIN (GLUCOPHAGE) 1000 MG tablet TAKE ONE TABLET BY MOUTH TWICE A DAY   methylPREDNISolone (MEDROL DOSEPAK) 4 MG TBPK tablet Take as package instructions.   metoprolol tartrate (LOPRESSOR) 25 MG tablet TAKE ONE-HALF TABLET (12.5MG TOTAL) BY MOUTH TWO TIMES DAILY (Patient taking differently: Take 12.5 mg by mouth 2 (two) times daily.)   omeprazole  (PRILOSEC) 40 MG capsule TAKE ONE CAPSULE BY MOUTH DAILY   polyethylene glycol (MIRALAX / GLYCOLAX) 17 g packet Take 17 g by mouth daily.   potassium chloride (KLOR-CON) 10 MEQ tablet Take 2 tablets (20 mEq total) by mouth daily.   simvastatin (ZOCOR) 40 MG tablet TAKE ONE TABLET (40MG TOTAL) BY MOUTH BEDTIME (Patient taking differently: Take 40 mg by mouth at bedtime.)   SYNTHROID 137 MCG tablet TAKE ONE TABLET (137MCG TOTAL) BY MOUTH DAILY BEFORE BREAKFAST   traMADol (ULTRAM) 50 MG tablet TAKE ONE TABLET (50MG TOTAL) BY MOUTH TWO TIMES DAILY AS NEEDED   zinc oxide (MEIJER ZINC OXIDE) 20 % ointment Apply 1 application topically as needed for irritation.   [DISCONTINUED] ciprofloxacin (CIPRO) 500 MG tablet Take 1 tablet (500 mg total) by mouth 2 (two) times daily.     Allergies:   Patient has no known allergies.   ROS:   Please see the history of present  illness.     All other systems reviewed and are negative.   Labs/Other Tests and Data Reviewed:    Recent Labs: 09/26/2020: TSH 3.414 09/28/2020: Magnesium 1.9 12/09/2020: ALT 36; BUN 12; Creatinine, Ser 0.84; Hemoglobin 10.9; Platelets 271; Potassium 3.5; Sodium 139   Recent Lipid Panel Lab Results  Component Value Date/Time   CHOL 162 04/13/2019 12:41 PM   TRIG 157 (H) 04/13/2019 12:41 PM   HDL 40 (L) 04/13/2019 12:41 PM   CHOLHDL 4.1 04/13/2019 12:41 PM   LDLCALC 97 04/13/2019 12:41 PM    Wt Readings from Last 3 Encounters:  11/11/20 160 lb 9.6 oz (72.8 kg)  11/05/20 156 lb (70.8 kg)  10/14/20 156 lb 1.6 oz (70.8 kg)     ASSESSMENT & PLAN:    Acute bronchitis Started azithromycin, low threshold for antibiotic due to concurrent chemotherapy Medrol Dosepak Robitussin as needed for cough and congestion Albuterol as needed for mild dyspnea/wheezing Advised to contact if persistent or worsening symptoms  Time:   Today, I have spent 16 minutes reviewing the chart, including problem list, medications, and with the patient  with telehealth technology discussing the above problems.   Medication Adjustments/Labs and Tests Ordered: Current medicines are reviewed at length with the patient today.  Concerns regarding medicines are outlined above.   Tests Ordered: No orders of the defined types were placed in this encounter.   Medication Changes: Meds ordered this encounter  Medications   azithromycin (ZITHROMAX) 250 MG tablet    Sig: Take 2 tablets on day 1, then 1 tablet daily on days 2 through 5    Dispense:  6 tablet    Refill:  0   methylPREDNISolone (MEDROL DOSEPAK) 4 MG TBPK tablet    Sig: Take as package instructions.    Dispense:  1 each    Refill:  0   guaiFENesin-dextromethorphan (ROBITUSSIN DM) 100-10 MG/5ML syrup    Sig: Take 5 mLs by mouth every 4 (four) hours as needed for cough.    Dispense:  118 mL    Refill:  0   albuterol (VENTOLIN HFA) 108 (90 Base) MCG/ACT inhaler    Sig: Inhale 2 puffs into the lungs every 6 (six) hours as needed for wheezing or shortness of breath.    Dispense:  8 g    Refill:  0    Okay to substitute to generic/formulary Albuterol.     Note: This dictation was prepared with Dragon dictation along with smaller phrase technology. Similar sounding words can be transcribed inadequately or may not be corrected upon review. Any transcriptional errors that result from this process are unintentional.      Disposition:  Follow up  Signed, Kim Spar, MD  01/14/2021 10:43 AM     [place]

## 2021-01-19 ENCOUNTER — Other Ambulatory Visit (HOSPITAL_COMMUNITY): Payer: Self-pay

## 2021-01-19 MED ORDER — LENALIDOMIDE 10 MG PO CAPS: 10.0000 mg | ORAL_CAPSULE | Freq: Every day | ORAL | 0 refills | Status: DC

## 2021-01-19 NOTE — Telephone Encounter (Signed)
Chart reviewed. Revlimid refilled per verbal order from Dr. Katragadda. 

## 2021-01-26 ENCOUNTER — Encounter: Payer: Self-pay | Admitting: Internal Medicine

## 2021-01-26 ENCOUNTER — Other Ambulatory Visit: Payer: Self-pay

## 2021-01-26 ENCOUNTER — Ambulatory Visit (INDEPENDENT_AMBULATORY_CARE_PROVIDER_SITE_OTHER): Payer: Medicare Other | Admitting: Internal Medicine

## 2021-01-26 VITALS — BP 118/63 | HR 51 | Resp 20 | Ht 63.0 in

## 2021-01-26 DIAGNOSIS — E1140 Type 2 diabetes mellitus with diabetic neuropathy, unspecified: Secondary | ICD-10-CM | POA: Diagnosis not present

## 2021-01-26 DIAGNOSIS — C9000 Multiple myeloma not having achieved remission: Secondary | ICD-10-CM | POA: Diagnosis not present

## 2021-01-26 DIAGNOSIS — E1143 Type 2 diabetes mellitus with diabetic autonomic (poly)neuropathy: Secondary | ICD-10-CM | POA: Diagnosis not present

## 2021-01-26 DIAGNOSIS — I10 Essential (primary) hypertension: Secondary | ICD-10-CM

## 2021-01-26 DIAGNOSIS — I779 Disorder of arteries and arterioles, unspecified: Secondary | ICD-10-CM

## 2021-01-26 NOTE — Patient Instructions (Addendum)
Please take Gabapentin twice daily instead of once daily.  Please continue taking other medications as prescribed.

## 2021-01-26 NOTE — Progress Notes (Signed)
[place], [place] [address]   CLINIC:  Medical Oncology/Hematology  PCP:  Lindell Spar, MD [address] / [place] [address] [phone]   REASON FOR VISIT:  Follow-up for multiple myeloma & normocytic anemia  PRIOR THERAPY:   Velcade x 9 cycles from 10/29/2019 to 05/02/2020 Revlimid 2/3 weeks  NGS Results: not done  CURRENT THERAPY: surveillance  BRIEF ONCOLOGIC HISTORY:  Oncology History  Multiple myeloma not having achieved remission ([place])  10/13/2019 Initial Diagnosis   Multiple myeloma not having achieved remission ([place])   10/29/2019 -  Chemotherapy    Patient is on Treatment Plan: MYELOMA NON-TRANSPLANT CANDIDATES VRD WEEKLY Q21D         CANCER STAGING:  Cancer Staging  No matching staging information was found for the patient.  INTERVAL HISTORY:  [name], a 80 y.o. female, returns for routine follow-up of her multiple myeloma & normocytic anemia. Kim Mills was last seen on 12/09/2020.   Today she reports feeling good, and she is accompanied by her daughter. She reports pain in her legs and knees bilaterally associated with arthritis which has worsened since Saturday, hydrocodone helps for only 2 hours. She reports diarrhea and constipation intermittently; her diarrhea has improved since decreasing magnesium to once daily. She has started taking Gabapentin BID. She is taking Revlimid and tolerating it well. Her appetite is fair. She reports occasional abdominal pain. She denies vomiting. She has a history of gall stones. She reports nausea. She had acute bronchitis 2 weeks ago for which she was given Z-pak and Robitussin.   REVIEW OF SYSTEMS:  Review of Systems  Constitutional:  Positive for fatigue (depleted). Negative for appetite change.  Respiratory:  Negative for cough.   Gastrointestinal:  Positive for abdominal pain (occasional), constipation, diarrhea and nausea. Negative for vomiting.   Musculoskeletal:  Positive for arthralgias (9/10 knees and legs).  Psychiatric/Behavioral:  Positive for sleep disturbance.   All other systems reviewed and are negative.  PAST MEDICAL/SURGICAL HISTORY:  Past Medical History:  Diagnosis Date   Arthritis    Cancer [facility])    Cataract    Chest pain    Associated with weakness and fatigue   Diabetes mellitus    A1c of 7.4 in 08/2010   GERD (gastroesophageal reflux disease)    Glaucoma    Hyperlipidemia    Lipid profile in 08/2010:190, 121, 48, 118; normal CBC and CMet   Hypertension    Lipid profile in 08/2010:190, 121, 48, 118; normal CBC and CMet   Hypothyroidism    Hypothyroidism    Obesity    Past Surgical History:  Procedure Laterality Date   CHOLECYSTECTOMY     COLONOSCOPY N/A 04/01/2014   RMR: Melanosis coli. colonic polyps removed as described above.    CYSTOSCOPY W/ URETERAL STENT PLACEMENT Right 09/13/2019   Procedure: CYSTOSCOPY WITH RIGHT  RETROGRADE PYELOGRAM; BLADDER BIOPSY;  Surgeon: Cleon Gustin, MD;  Location: [facility];  Service: Urology;  Laterality: Right;   LEFT HEART CATH AND CORONARY ANGIOGRAPHY N/A 10/07/2017   Procedure: LEFT HEART CATH AND CORONARY ANGIOGRAPHY;  Surgeon: Troy Sine, MD;  Location: [facility];  Service: Cardiovascular;  Laterality: N/A;   THYROIDECTOMY, PARTIAL      SOCIAL HISTORY:  Social History   Socioeconomic History   Marital status: Widowed    Spouse name: Not on file   Number of children: 7   Years of education: Not on file  Highest education level: Not on file  Occupational History    Employer: RETIRED  Tobacco Use   Smoking status: Never    Passive exposure: Yes   Smokeless tobacco: Never  Vaping Use   Vaping Use: Never used  Substance and Sexual Activity   Alcohol use: Never   Drug use: Never   Sexual activity: Not Currently  Other Topics Concern   Not on file  Social History Narrative   Daughter, Corky Sing, lives with and assists patient.    Social  Determinants of Health   Financial Resource Strain: Low Risk    Difficulty of Paying Living Expenses: Not hard at all  Food Insecurity: No Food Insecurity   Worried About Charity fundraiser in the Last Year: Never true   [place] in the Last Year: Never true  Transportation Needs: No Transportation Needs   Lack of Transportation (Medical): No   Lack of Transportation (Non-Medical): No  Physical Activity: Sufficiently Active   Days of Exercise per Week: 5 days   Minutes of Exercise per Session: 30 min  Stress: No Stress Concern Present   Feeling of Stress : Not at all  Social Connections: Moderately Integrated   Frequency of Communication with Friends and Family: More than three times a week   Frequency of Social Gatherings with Friends and Family: More than three times a week   Attends Religious Services: 1 to 4 times per year   Active Member of [employer] or Organizations: Yes   Attends Archivist Meetings: 1 to 4 times per year   Marital Status: Widowed  Human resources officer Violence: Not At Risk   Fear of Current or Ex-Partner: No   Emotionally Abused: No   Physically Abused: No   Sexually Abused: No    FAMILY HISTORY:  Family History  Problem Relation Age of Onset   Anemia Father    Arthritis Mother    Stroke Brother    Cancer Sister        unknown kind   Diabetes Sister    Dementia Sister    Cancer Niece        Breast   Cancer Brother    Healthy Son    Healthy Son    Healthy Son    Healthy Daughter    Healthy Daughter    Healthy Daughter    Sarcoidosis Daughter     CURRENT MEDICATIONS:  Current Outpatient Medications  Medication Sig Dispense Refill   acyclovir (ZOVIRAX) 400 MG tablet TAKE ONE TABLET (400MG TOTAL) BY MOUTH TWICE DAILY 60 tablet 6   albuterol (VENTOLIN HFA) 108 (90 Base) MCG/ACT inhaler Inhale 2 puffs into the lungs every 6 (six) hours as needed for wheezing or shortness of breath. 8 g 0   ASPIRIN 81 PO Take 1 tablet by mouth daily.      blood glucose meter kit and supplies Dispense based on patient and insurance preference. Use up to four times daily as directed. (FOR ICD-10 E10.9, E11.9). 1 each 0   Blood Glucose Monitoring Suppl (BLOOD GLUCOSE SYSTEM PAK) KIT Please dispense based on patient and insurance preference. Use as directed to monitor FSBS 2x daily. Dx: E11.9. 1 each 1   Calcium 500 MG tablet Take 600 mg by mouth 2 (two) times daily.     fluticasone (FLONASE) 50 MCG/ACT nasal spray USE TWO SPRAYS IN BOTH NOSTRILS DAILY (Patient taking differently: Place 2 sprays into both nostrils daily.) 16 g 6   gabapentin (NEURONTIN) 300  MG capsule TAKE ONE CAPSULE BY MOUTH EVERY NIGHT AT BEDTIME 90 capsule 3   glipiZIDE (GLUCOTROL) 5 MG tablet TAKE ONE TABLET (5MG TOTAL) BY MOUTH TWOTIMES DAILY (Patient taking differently: Take 5 mg by mouth 2 (two) times daily before a meal.) 180 tablet 3   Glucose Blood (BLOOD GLUCOSE TEST STRIPS) STRP Please dispense based on patient and insurance preference. Use as directed to monitor FSBS 2x daily. Dx: E11.9. 100 each 11   HM LORATADINE 10 MG tablet TAKE ONE TABLET (10MG TOTAL) BY MOUTH DAILY (Patient taking differently: Take 10 mg by mouth daily as needed for allergies.) 30 tablet 0   HYDROcodone-acetaminophen (NORCO/VICODIN) 5-325 MG tablet TAKE ONE (1) TABLET BY MOUTH EVERY 6 HOURS AS NEEDED FOR MODERATE PAIN OR SEVERE PAIN 60 tablet 0   Lancets MISC Please dispense based on patient and insurance preference. Use as directed to monitor FSBS 2x daily. Dx: E11.9. 100 each 11   magic mouthwash w/lidocaine SOLN Take 5 mLs by mouth 4 (four) times daily. 450 mL 0   magnesium oxide (MAG-OX) 400 (240 Mg) MG tablet TAKE ONE TABLET (400MG TOTAL) BY MOUTH TWO TIMES DAILY (Patient taking differently: Take 400 mg by mouth 2 (two) times daily.) 120 tablet 1   meclizine (ANTIVERT) 25 MG tablet Take 25 mg by mouth every 6 (six) hours as needed for dizziness.     metFORMIN (GLUCOPHAGE) 1000 MG tablet TAKE ONE  TABLET BY MOUTH TWICE A DAY 60 tablet 3   metoprolol tartrate (LOPRESSOR) 25 MG tablet TAKE ONE-HALF TABLET (12.5MG TOTAL) BY MOUTH TWO TIMES DAILY (Patient taking differently: Take 12.5 mg by mouth 2 (two) times daily.) 90 tablet 3   omeprazole (PRILOSEC) 40 MG capsule TAKE ONE CAPSULE BY MOUTH DAILY 90 capsule 3   polyethylene glycol (MIRALAX / GLYCOLAX) 17 g packet Take 17 g by mouth daily. 14 each 0   potassium chloride (KLOR-CON) 10 MEQ tablet Take 2 tablets (20 mEq total) by mouth daily. 60 tablet 2   simvastatin (ZOCOR) 40 MG tablet TAKE ONE TABLET (40MG TOTAL) BY MOUTH BEDTIME (Patient taking differently: Take 40 mg by mouth at bedtime.) 90 tablet 3   SYNTHROID 137 MCG tablet TAKE ONE TABLET (137MCG TOTAL) BY MOUTH DAILY BEFORE BREAKFAST 30 tablet 0   traMADol (ULTRAM) 50 MG tablet TAKE ONE TABLET (50MG TOTAL) BY MOUTH TWO TIMES DAILY AS NEEDED 60 tablet 0   zinc oxide (MEIJER ZINC OXIDE) 20 % ointment Apply 1 application topically as needed for irritation. 56.7 g 0   guaiFENesin-dextromethorphan (ROBITUSSIN DM) 100-10 MG/5ML syrup Take 5 mLs by mouth every 4 (four) hours as needed for cough. 118 mL 0   lenalidomide (REVLIMID) 10 MG capsule Take 1 capsule (10 mg total) by mouth daily. 14 days on, 7 days off 14 capsule 0   No current facility-administered medications for this visit.    ALLERGIES:  No Known Allergies  PHYSICAL EXAM:  Performance status (ECOG): 3 - Symptomatic, >50% confined to bed  Vitals:   01/27/21 1348  BP: (!) 142/59  Pulse: 86  Resp: 18  Temp: (!) 96.6 F (35.9 C)  SpO2: 100%   Wt Readings from Last 3 Encounters:  11/11/20 160 lb 9.6 oz (72.8 kg)  11/05/20 156 lb (70.8 kg)  10/14/20 156 lb 1.6 oz (70.8 kg)   Physical Exam Vitals reviewed.  Constitutional:      Appearance: Normal appearance.     Comments: In wheelchair  Cardiovascular:     Rate and  Rhythm: Normal rate and regular rhythm.     Pulses: Normal pulses.     Heart sounds: Normal heart  sounds.  Pulmonary:     Effort: Pulmonary effort is normal.     Breath sounds: Normal breath sounds.  Lymphadenopathy:     Lower Body: No right inguinal adenopathy. No left inguinal adenopathy.  Neurological:     General: No focal deficit present.     Mental Status: She is alert and oriented to person, place, and time.  Psychiatric:        Mood and Affect: Mood normal.        Behavior: Behavior normal.     LABORATORY DATA:  I have reviewed the labs as listed.  CBC Latest Ref Rng & Units 01/27/2021 12/09/2020 11/11/2020  WBC 4.0 - 10.5 K/uL 5.2 6.8 6.3  Hemoglobin 12.0 - 15.0 g/dL 12.1 10.9(L) 10.4(L)  Hematocrit 36.0 - 46.0 % 39.3 34.5(L) 33.8(L)  Platelets 150 - 400 K/uL 147(L) 271 208   CMP Latest Ref Rng & Units 01/27/2021 12/09/2020 11/11/2020  Glucose 70 - 99 mg/dL 174(H) 174(H) 130(H)  BUN 8 - 23 mg/dL 18 12 10   Creatinine 0.44 - 1.00 mg/dL 1.01(H) 0.84 0.59  Sodium 135 - 145 mmol/L 138 139 142  Potassium 3.5 - 5.1 mmol/L 3.9 3.5 2.7(LL)  Chloride 98 - 111 mmol/L 106 107 108  CO2 22 - 32 mmol/L 22 24 26   Calcium 8.9 - 10.3 mg/dL 8.1(L) 8.6(L) 7.5(L)  Total Protein 6.5 - 8.1 g/dL 7.4 6.7 6.7  Total Bilirubin 0.3 - 1.2 mg/dL 1.0 0.8 1.0  Alkaline Phos 38 - 126 U/L 195(H) 46 64  AST 15 - 41 U/L 626(H) 36 20  ALT 0 - 44 U/L 526(H) 36 23    DIAGNOSTIC IMAGING:  I have independently reviewed the scans and discussed with the patient. No results found.   ASSESSMENT:  1.  Normocytic anemia: -Patient seen at the request of Dr. Buelah Manis for further work-up and management of normocytic anemia. -Recent CBC on 08/21/2019 shows hemoglobin 6.9 with MCV of 88.3.  White count and platelets are normal.  No history of CKD.  Denies any bleeding per rectum or melena. Last colonoscopy on 04/01/2014 shows diffusely pigmented rectal mucosa consistent with melanosis Kim Mills otherwise normal mucosa.  Diffusely pigmented colonic mucosa, 2 diminutive polyps in the mid ascending segment, otherwise the  remainder of the colon mucosa was normal.  Biopsy consistent with tubular adenoma. -CT renal study on 05/11/2019 shows normal-sized spleen with normal liver.  No other abnormal adenopathy. -Denies any prior history of blood transfusion.  She is currently taking iron tablet twice daily.   2.  IgG lambda plasma cell myeloma: -Skeletal survey on 09/12/2019 shows diffuse faint lucencies in the skull, bilateral femurs. -Bone marrow biopsy on 09/27/2019 shows 70% plasma cells in hypercellular marrow.  Chromosome analysis was 40, XX. FISH panel was normal.  However quality of specimen is compromised. -LDH normal, beta-2 microglobulin 2.9.  M spike was 2.1 g.  Kappa light chains 19.7, lambda light chains 14.3 with ratio of 1.38. -24-hour urine shows nonnephrotic range proteinuria.  Urine immunofixation was positive. - 9 cycles of RVD from 10/29/2019 through 05/02/2020. - Because of her age and comorbidities, she was not considered a candidate for bone marrow transplant.   PLAN:  1.  IgG lambda plasma cell myeloma: - Reviewed myeloma panel from 12/09/2020.  M spike is stable at 0.2 g.  Lambda light chains at 29.7 and ratio is 1.63.  Immunofixation shows IgG lambda. - She is tolerating Revlimid 10 mg 2 weeks on/1 week off very well.  This is her week off. - I reviewed her labs today which showed AST elevated at 626 and ALT at 526 and alk phos 195.  LDH elevated at 602. - No recent new medications. - We have done right upper quadrant ultrasound which showed mildly dilated CBD.  This is not unexpected after cholecystectomy.  No focal liver lesions.  Normal parenchymal echogenicity. - Transaminitis most likely medication induced.  We will hold off on any Tylenol. - She reportedly had upper respiratory infection on 01/14/2021 and was prescribed Z-Pak and Robitussin-DM and Medrol Dosepak. - We will repeat LFTs on Thursday.  She was instructed to go to the ER should any of her symptoms get worse.  I will see her in 1  week for follow-up.   2.  Diabetes: - Continue glipizide and metformin.  Hold Zocor until LFTs improved.   3.  Hypertension: - Continue amlodipine and HCTZ.   4.  Weight loss: - She has lost some weight since May.  However she is eating better and is not losing weight anymore.   5.  Diarrhea: - Diarrhea has improved since she cut back on magnesium to once daily.   6.  Bilateral knee pains: - She reports slight worsening of knee pain and leg pains since 01/24/2021.  She is currently taking hydrocodone 5/325 every 4 hours as needed.  She reports pain relief lasting no more than 2 hours. - Due to her elevated LFTs, I recommend hold off on hydrocodone at this time. - We will start her on oxycodone 5 mg every 6 hours as needed.   7.  Infection prophylaxis: -Due to elevated LFTs, will hold off on acyclovir.   8.  Bone protection: - Continue calcium daily. - We will hold her denosumab today.  Once her LFTs improve, will restart.  9.  Hypokalemia: - Continue potassium 20 mEq daily.   Orders placed this encounter:  No orders of the defined types were placed in this encounter.  Total time spent is 40 minutes with more than 50% of the time spent face-to-face reviewing records, counseling and coordination of care.  Derek Jack, MD [place] [phone]   I, Thana Ates, am acting as a scribe for Dr. Derek Jack.  I, Derek Jack MD, have reviewed the above documentation for accuracy and completeness, and I agree with the above.

## 2021-01-27 ENCOUNTER — Inpatient Hospital Stay (HOSPITAL_COMMUNITY): Payer: Medicare Other

## 2021-01-27 ENCOUNTER — Ambulatory Visit (HOSPITAL_COMMUNITY)
Admission: RE | Admit: 2021-01-27 | Discharge: 2021-01-27 | Disposition: A | Discharge status: Home / Self Care | Payer: Medicare Other | Source: Ambulatory Visit | Attending: Hematology | Admitting: Hematology

## 2021-01-27 ENCOUNTER — Encounter (HOSPITAL_COMMUNITY): Payer: Self-pay | Admitting: Hematology

## 2021-01-27 ENCOUNTER — Inpatient Hospital Stay (HOSPITAL_COMMUNITY): Payer: Medicare Other | Attending: Hematology | Admitting: Hematology

## 2021-01-27 VITALS — BP 142/59 | HR 86 | Temp 96.6°F | Resp 18 | Ht 63.0 in

## 2021-01-27 DIAGNOSIS — E876 Hypokalemia: Secondary | ICD-10-CM | POA: Diagnosis not present

## 2021-01-27 DIAGNOSIS — K838 Other specified diseases of biliary tract: Secondary | ICD-10-CM | POA: Diagnosis not present

## 2021-01-27 DIAGNOSIS — R748 Abnormal levels of other serum enzymes: Secondary | ICD-10-CM

## 2021-01-27 DIAGNOSIS — M25562 Pain in left knee: Secondary | ICD-10-CM | POA: Insufficient documentation

## 2021-01-27 DIAGNOSIS — M25561 Pain in right knee: Secondary | ICD-10-CM | POA: Diagnosis not present

## 2021-01-27 DIAGNOSIS — D649 Anemia, unspecified: Secondary | ICD-10-CM | POA: Insufficient documentation

## 2021-01-27 DIAGNOSIS — E119 Type 2 diabetes mellitus without complications: Secondary | ICD-10-CM | POA: Diagnosis not present

## 2021-01-27 DIAGNOSIS — R634 Abnormal weight loss: Secondary | ICD-10-CM | POA: Insufficient documentation

## 2021-01-27 DIAGNOSIS — Z7984 Long term (current) use of oral hypoglycemic drugs: Secondary | ICD-10-CM | POA: Diagnosis not present

## 2021-01-27 DIAGNOSIS — R197 Diarrhea, unspecified: Secondary | ICD-10-CM | POA: Insufficient documentation

## 2021-01-27 DIAGNOSIS — C9000 Multiple myeloma not having achieved remission: Secondary | ICD-10-CM | POA: Diagnosis not present

## 2021-01-27 DIAGNOSIS — Z79899 Other long term (current) drug therapy: Secondary | ICD-10-CM | POA: Insufficient documentation

## 2021-01-27 LAB — CBC WITH DIFFERENTIAL/PLATELET
Abs Immature Granulocytes: 0.04 10*3/uL (ref 0.00–0.07)
Basophils Absolute: 0.1 10*3/uL (ref 0.0–0.1)
Basophils Relative: 1 %
Eosinophils Absolute: 0.2 10*3/uL (ref 0.0–0.5)
Eosinophils Relative: 3 %
HCT: 39.3 % (ref 36.0–46.0)
Hemoglobin: 12.1 g/dL (ref 12.0–15.0)
Immature Granulocytes: 1 %
Lymphocytes Relative: 45 %
Lymphs Abs: 2.3 10*3/uL (ref 0.7–4.0)
MCH: 29.9 pg (ref 26.0–34.0)
MCHC: 30.8 g/dL (ref 30.0–36.0)
MCV: 97 fL (ref 80.0–100.0)
Monocytes Absolute: 0.4 10*3/uL (ref 0.1–1.0)
Monocytes Relative: 8 %
Neutro Abs: 2.2 10*3/uL (ref 1.7–7.7)
Neutrophils Relative %: 42 %
Platelets: 147 10*3/uL — ABNORMAL LOW (ref 150–400)
RBC: 4.05 MIL/uL (ref 3.87–5.11)
RDW: 16.5 % — ABNORMAL HIGH (ref 11.5–15.5)
WBC: 5.2 10*3/uL (ref 4.0–10.5)
nRBC: 0 % (ref 0.0–0.2)

## 2021-01-27 LAB — COMPREHENSIVE METABOLIC PANEL
ALT: 526 U/L — ABNORMAL HIGH (ref 0–44)
AST: 626 U/L — ABNORMAL HIGH (ref 15–41)
Albumin: 3.6 g/dL (ref 3.5–5.0)
Alkaline Phosphatase: 195 U/L — ABNORMAL HIGH (ref 38–126)
Anion gap: 10 (ref 5–15)
BUN: 18 mg/dL (ref 8–23)
CO2: 22 mmol/L (ref 22–32)
Calcium: 8.1 mg/dL — ABNORMAL LOW (ref 8.9–10.3)
Chloride: 106 mmol/L (ref 98–111)
Creatinine, Ser: 1.01 mg/dL — ABNORMAL HIGH (ref 0.44–1.00)
GFR, Estimated: 57 mL/min — ABNORMAL LOW (ref >60)
Glucose, Bld: 174 mg/dL — ABNORMAL HIGH (ref 70–99)
Potassium: 3.9 mmol/L (ref 3.5–5.1)
Sodium: 138 mmol/L (ref 135–145)
Total Bilirubin: 1 mg/dL (ref 0.3–1.2)
Total Protein: 7.4 g/dL (ref 6.5–8.1)

## 2021-01-27 LAB — LACTATE DEHYDROGENASE: LDH: 602 U/L — ABNORMAL HIGH (ref 98–192)

## 2021-01-27 MED ORDER — OXYCODONE HCL 5 MG PO TABS: 5.0000 mg | ORAL_TABLET | ORAL | 0 refills | Status: DC | PRN

## 2021-01-27 NOTE — Progress Notes (Signed)
Per Dr. Delton Coombes patient not receiving [name] today due to elevated liver enzymes.

## 2021-01-27 NOTE — Patient Instructions (Signed)
[place] at [facility] Discharge Instructions  You were seen and examined today by Dr. Delton Coombes. He reviewed your most recent labs and her liver test is really high. He is ordering liver ultrasound and he is changing her hydrocodone to something without tylenol. He wants her to stop the hydrocodone, zocor (simvastatin), and acyclovir because these can cause the liver enzymes to be elevated. If you have any change in how you are feeling now he wants you to go to the emergency department. Please keep follow up appointment as scheduled.   Thank you for choosing [place] at [facility] to provide your oncology and hematology care.  To afford each patient quality time with our provider, please arrive at least 15 minutes before your scheduled appointment time.   If you have a lab appointment with the [facility] please come in thru the Main Entrance and check in at the main information desk.  You need to re-schedule your appointment should you arrive 10 or more minutes late.  We strive to give you quality time with our providers, and arriving late affects you and other patients whose appointments are after yours.  Also, if you no show three or more times for appointments you may be dismissed from the clinic at the providers discretion.     Again, thank you for choosing [facility].  Our hope is that these requests will decrease the amount of time that you wait before being seen by our physicians.       _____________________________________________________________  Should you have questions after your visit to [facility], please contact our office at [phone] and follow the prompts.  Our office hours are 8:00 a.m. and 4:30 p.m. Monday - Friday.  Please note that voicemails left after 4:00 p.m. may not be returned until the following business day.  We are closed weekends and major holidays.  You do have access to a nurse  24-7, just call the main number to the clinic [phone] and do not press any options, hold on the line and a nurse will answer the phone.    For prescription refill requests, have your pharmacy contact our office and allow 72 hours.    Due to Covid, you will need to wear a mask upon entering the hospital. If you do not have a mask, a mask will be given to you at the Main Entrance upon arrival. For doctor visits, patients may have 1 support person age 41 or older with them. For treatment visits, patients can not have anyone with them due to social distancing guidelines and our immunocompromised population.

## 2021-01-27 NOTE — Patient Instructions (Signed)
[facility]  Discharge Instructions: ?Thank you for choosing [facility] to provide your oncology and hematology care.  ?If you have a lab appointment with the Cancer Center, please come in thru the Main Entrance and check in at the main information desk. ? ?Wear comfortable clothing and clothing appropriate for easy access to any Portacath or PICC line.  ? ?We strive to give you quality time with your provider. You may need to reschedule your appointment if you arrive late (15 or more minutes).  Arriving late affects you and other patients whose appointments are after yours.  Also, if you miss three or more appointments without notifying the office, you may be dismissed from the clinic at the provider?s discretion.    ?  ?For prescription refill requests, have your pharmacy contact our office and allow 72 hours for refills to be completed.   ? ?Today you received the following chemotherapy and/or immunotherapy agents No treatment today    ?  ?To help prevent nausea and vomiting after your treatment, we encourage you to take your nausea medication as directed. ? ?BELOW ARE SYMPTOMS THAT SHOULD BE REPORTED IMMEDIATELY: ?*FEVER GREATER THAN 100.4 F (38 ?C) OR HIGHER ?*CHILLS OR SWEATING ?*NAUSEA AND VOMITING THAT IS NOT CONTROLLED WITH YOUR NAUSEA MEDICATION ?*UNUSUAL SHORTNESS OF BREATH ?*UNUSUAL BRUISING OR BLEEDING ?*URINARY PROBLEMS (pain or burning when urinating, or frequent urination) ?*BOWEL PROBLEMS (unusual diarrhea, constipation, pain near the anus) ?TENDERNESS IN MOUTH AND THROAT WITH OR WITHOUT PRESENCE OF ULCERS (sore throat, sores in mouth, or a toothache) ?UNUSUAL RASH, SWELLING OR PAIN  ?UNUSUAL VAGINAL DISCHARGE OR ITCHING  ? ?Items with * indicate a potential emergency and should be followed up as soon as possible or go to the Emergency Department if any problems should occur. ? ?Please show the CHEMOTHERAPY ALERT CARD or IMMUNOTHERAPY ALERT CARD at check-in to the  Emergency Department and triage nurse. ? ?Should you have questions after your visit or need to cancel or reschedule your appointment, please contact [facility] [phone]  and follow the prompts.  Office hours are 8:00 a.m. to 4:30 p.m. Monday - Friday. Please note that voicemails left after 4:00 p.m. may not be returned until the following business day.  We are closed weekends and major holidays. You have access to a nurse at all times for urgent questions. Please call the main number to the clinic [phone] and follow the prompts. ? ?For any non-urgent questions, you may also contact your provider using MyChart. We now offer e-Visits for anyone 18 and older to request care online for non-urgent symptoms. For details visit [email or web address]. ?  ?Also download the MyChart app! Go to the app store, search "MyChart", open the app, select , and log in with your MyChart username and password. ? ?Due to Covid, a mask is required upon entering the hospital/clinic. If you do not have a mask, one will be given to you upon arrival. For doctor visits, patients may have 1 support person aged 18 or older with them. For treatment visits, patients cannot have anyone with them due to current Covid guidelines and our immunocompromised population.  ?

## 2021-01-27 NOTE — Progress Notes (Signed)
Patient has been assessed, vital signs and labs have been reviewed by Dr. Delton Coombes. No injection at this time due to LFT being elevated per Dr. Delton Coombes.  Primary RN and pharmacy aware.

## 2021-01-28 ENCOUNTER — Encounter (HOSPITAL_COMMUNITY): Payer: Self-pay | Admitting: *Deleted

## 2021-01-28 LAB — KAPPA/LAMBDA LIGHT CHAINS
Kappa free light chain: 47.7 mg/L — ABNORMAL HIGH (ref 3.3–19.4)
Kappa, lambda light chain ratio: 1.31 (ref 0.26–1.65)
Lambda free light chains: 36.3 mg/L — ABNORMAL HIGH (ref 5.7–26.3)

## 2021-01-28 NOTE — Progress Notes (Signed)
Called daughter with [place] results.  States that she had multiple episodes of emesis with yellowish appearance last night.  States nothing as of yet this am.  Made her aware that if she continues to have vomiting to seek medical care in the ER.  Verbalized understanding.

## 2021-01-29 ENCOUNTER — Inpatient Hospital Stay (HOSPITAL_COMMUNITY): Payer: Medicare Other

## 2021-01-29 ENCOUNTER — Other Ambulatory Visit (HOSPITAL_COMMUNITY): Payer: Medicare Other

## 2021-01-29 ENCOUNTER — Other Ambulatory Visit: Payer: Self-pay

## 2021-01-29 DIAGNOSIS — M25562 Pain in left knee: Secondary | ICD-10-CM | POA: Diagnosis not present

## 2021-01-29 DIAGNOSIS — E119 Type 2 diabetes mellitus without complications: Secondary | ICD-10-CM | POA: Diagnosis not present

## 2021-01-29 DIAGNOSIS — R748 Abnormal levels of other serum enzymes: Secondary | ICD-10-CM

## 2021-01-29 DIAGNOSIS — C9000 Multiple myeloma not having achieved remission: Secondary | ICD-10-CM | POA: Diagnosis not present

## 2021-01-29 DIAGNOSIS — D649 Anemia, unspecified: Secondary | ICD-10-CM | POA: Diagnosis not present

## 2021-01-29 DIAGNOSIS — M25561 Pain in right knee: Secondary | ICD-10-CM | POA: Diagnosis not present

## 2021-01-29 DIAGNOSIS — E876 Hypokalemia: Secondary | ICD-10-CM | POA: Diagnosis not present

## 2021-01-29 LAB — HEPATIC FUNCTION PANEL
ALT: 227 U/L — ABNORMAL HIGH (ref 0–44)
AST: 69 U/L — ABNORMAL HIGH (ref 15–41)
Albumin: 3.7 g/dL (ref 3.5–5.0)
Alkaline Phosphatase: 143 U/L — ABNORMAL HIGH (ref 38–126)
Bilirubin, Direct: 0.1 mg/dL (ref 0.0–0.2)
Indirect Bilirubin: 0.7 mg/dL (ref 0.3–0.9)
Total Bilirubin: 0.8 mg/dL (ref 0.3–1.2)
Total Protein: 7.4 g/dL (ref 6.5–8.1)

## 2021-01-30 NOTE — Progress Notes (Signed)
Established Patient Office Visit  Subjective:  Patient ID: Kim Mills, female    DOB: October 01, 1940  Age: 80 y.o. MRN: [id number]  CC:  Chief Complaint  Patient presents with   Follow-up    3 month follow up pain medication not working but sees dr Raliegh Ip tomorrow     HPI Kim Mills is a 80 y.o. female with past medical history of multiple myeloma, HTN, CAD, DM with neuropathy, CKD stage 3, hypothyroidism, overactive bladder and chronic pain who presents for f/u of her chronic medical conditions.  HTN: Her BP is stable considering her age.Takes medications regularly. Patient denies headache, dizziness, chest pain, dyspnea or palpitations.   DM: She takes Metformin and Glipizide. Denies any polyuria or polyphagia. Last HbA1C was 6.9.  Diabetic neuropathy: She has been having chronic leg pain despite taking gabapentin.  She has chronic numbness and weakness of the feet.  Denies any recent injury or fall.  She complains of chronic, diffuse pain of joints.  She has been getting Norco for cancer related pain.  She follows up with Oncology for h/o MM.   Past Medical History:  Diagnosis Date   Arthritis    Cancer [facility])    Cataract    Chest pain    Associated with weakness and fatigue   Diabetes mellitus    A1c of 7.4 in 08/2010   GERD (gastroesophageal reflux disease)    Glaucoma    Hyperlipidemia    Lipid profile in 08/2010:190, 121, 48, 118; normal CBC and CMet   Hypertension    Lipid profile in 08/2010:190, 121, 48, 118; normal CBC and CMet   Hypothyroidism    Hypothyroidism    Obesity     Past Surgical History:  Procedure Laterality Date   CHOLECYSTECTOMY     COLONOSCOPY N/A 04/01/2014   RMR: Melanosis coli. colonic polyps removed as described above.    CYSTOSCOPY W/ URETERAL STENT PLACEMENT Right 09/13/2019   Procedure: CYSTOSCOPY WITH RIGHT  RETROGRADE PYELOGRAM; BLADDER BIOPSY;  Surgeon: Cleon Gustin, MD;  Location: [facility];  Service: Urology;  Laterality:  Right;   LEFT HEART CATH AND CORONARY ANGIOGRAPHY N/A 10/07/2017   Procedure: LEFT HEART CATH AND CORONARY ANGIOGRAPHY;  Surgeon: Troy Sine, MD;  Location: [facility];  Service: Cardiovascular;  Laterality: N/A;   THYROIDECTOMY, PARTIAL      Family History  Problem Relation Age of Onset   Anemia Father    Arthritis Mother    Stroke Brother    Cancer Sister        unknown kind   Diabetes Sister    Dementia Sister    Cancer Niece        Breast   Cancer Brother    Healthy Son    Healthy Son    Healthy Son    Healthy Daughter    Healthy Daughter    Healthy Daughter    Sarcoidosis Daughter     Social History   Socioeconomic History   Marital status: Widowed    Spouse name: Not on file   Number of children: 7   Years of education: Not on file   Highest education level: Not on file  Occupational History    Employer: RETIRED  Tobacco Use   Smoking status: Never    Passive exposure: Yes   Smokeless tobacco: Never  Vaping Use   Vaping Use: Never used  Substance and Sexual Activity   Alcohol use: Never   Drug use:  Never   Sexual activity: Not Currently  Other Topics Concern   Not on file  Social History Narrative   Daughter, Corky Sing, lives with and assists patient.    Social Determinants of Health   Financial Resource Strain: Low Risk    Difficulty of Paying Living Expenses: Not hard at all  Food Insecurity: No Food Insecurity   Worried About Charity fundraiser in the Last Year: Never true   [place] in the Last Year: Never true  Transportation Needs: No Transportation Needs   Lack of Transportation (Medical): No   Lack of Transportation (Non-Medical): No  Physical Activity: Sufficiently Active   Days of Exercise per Week: 5 days   Minutes of Exercise per Session: 30 min  Stress: No Stress Concern Present   Feeling of Stress : Not at all  Social Connections: Moderately Integrated   Frequency of Communication with Friends and Family: More than  three times a week   Frequency of Social Gatherings with Friends and Family: More than three times a week   Attends Religious Services: 1 to 4 times per year   Active Member of [employer] or Organizations: Yes   Attends Archivist Meetings: 1 to 4 times per year   Marital Status: Widowed  Human resources officer Violence: Not At Risk   Fear of Current or Ex-Partner: No   Emotionally Abused: No   Physically Abused: No   Sexually Abused: No    Outpatient Medications Prior to Visit  Medication Sig Dispense Refill   acyclovir (ZOVIRAX) 400 MG tablet TAKE ONE TABLET (400MG TOTAL) BY MOUTH TWICE DAILY 60 tablet 6   albuterol (VENTOLIN HFA) 108 (90 Base) MCG/ACT inhaler Inhale 2 puffs into the lungs every 6 (six) hours as needed for wheezing or shortness of breath. 8 g 0   ASPIRIN 81 PO Take 1 tablet by mouth daily.     blood glucose meter kit and supplies Dispense based on patient and insurance preference. Use up to four times daily as directed. (FOR ICD-10 E10.9, E11.9). 1 each 0   Blood Glucose Monitoring Suppl (BLOOD GLUCOSE SYSTEM PAK) KIT Please dispense based on patient and insurance preference. Use as directed to monitor FSBS 2x daily. Dx: E11.9. 1 each 1   Calcium 500 MG tablet Take 600 mg by mouth 2 (two) times daily.     fluticasone (FLONASE) 50 MCG/ACT nasal spray USE TWO SPRAYS IN BOTH NOSTRILS DAILY (Patient taking differently: Place 2 sprays into both nostrils daily.) 16 g 6   gabapentin (NEURONTIN) 300 MG capsule TAKE ONE CAPSULE BY MOUTH EVERY NIGHT AT BEDTIME 90 capsule 3   glipiZIDE (GLUCOTROL) 5 MG tablet TAKE ONE TABLET (5MG TOTAL) BY MOUTH TWOTIMES DAILY (Patient taking differently: Take 5 mg by mouth 2 (two) times daily before a meal.) 180 tablet 3   Glucose Blood (BLOOD GLUCOSE TEST STRIPS) STRP Please dispense based on patient and insurance preference. Use as directed to monitor FSBS 2x daily. Dx: E11.9. 100 each 11   guaiFENesin-dextromethorphan (ROBITUSSIN DM) 100-10 MG/5ML  syrup Take 5 mLs by mouth every 4 (four) hours as needed for cough. 118 mL 0   HM LORATADINE 10 MG tablet TAKE ONE TABLET (10MG TOTAL) BY MOUTH DAILY (Patient taking differently: Take 10 mg by mouth daily as needed for allergies.) 30 tablet 0   HYDROcodone-acetaminophen (NORCO/VICODIN) 5-325 MG tablet TAKE ONE (1) TABLET BY MOUTH EVERY 6 HOURS AS NEEDED FOR MODERATE PAIN OR SEVERE PAIN 60 tablet 0  Lancets MISC Please dispense based on patient and insurance preference. Use as directed to monitor FSBS 2x daily. Dx: E11.9. 100 each 11   lenalidomide (REVLIMID) 10 MG capsule Take 1 capsule (10 mg total) by mouth daily. 14 days on, 7 days off 14 capsule 0   magic mouthwash w/lidocaine SOLN Take 5 mLs by mouth 4 (four) times daily. 450 mL 0   magnesium oxide (MAG-OX) 400 (240 Mg) MG tablet TAKE ONE TABLET (400MG TOTAL) BY MOUTH TWO TIMES DAILY (Patient taking differently: Take 400 mg by mouth 2 (two) times daily.) 120 tablet 1   meclizine (ANTIVERT) 25 MG tablet Take 25 mg by mouth every 6 (six) hours as needed for dizziness.     metFORMIN (GLUCOPHAGE) 1000 MG tablet TAKE ONE TABLET BY MOUTH TWICE A DAY 60 tablet 3   metoprolol tartrate (LOPRESSOR) 25 MG tablet TAKE ONE-HALF TABLET (12.5MG TOTAL) BY MOUTH TWO TIMES DAILY (Patient taking differently: Take 12.5 mg by mouth 2 (two) times daily.) 90 tablet 3   omeprazole (PRILOSEC) 40 MG capsule TAKE ONE CAPSULE BY MOUTH DAILY 90 capsule 3   polyethylene glycol (MIRALAX / GLYCOLAX) 17 g packet Take 17 g by mouth daily. 14 each 0   potassium chloride (KLOR-CON) 10 MEQ tablet Take 2 tablets (20 mEq total) by mouth daily. 60 tablet 2   simvastatin (ZOCOR) 40 MG tablet TAKE ONE TABLET (40MG TOTAL) BY MOUTH BEDTIME (Patient taking differently: Take 40 mg by mouth at bedtime.) 90 tablet 3   SYNTHROID 137 MCG tablet TAKE ONE TABLET (137MCG TOTAL) BY MOUTH DAILY BEFORE BREAKFAST 30 tablet 0   traMADol (ULTRAM) 50 MG tablet TAKE ONE TABLET (50MG TOTAL) BY MOUTH TWO  TIMES DAILY AS NEEDED 60 tablet 0   zinc oxide (MEIJER ZINC OXIDE) 20 % ointment Apply 1 application topically as needed for irritation. 56.7 g 0   methylPREDNISolone (MEDROL DOSEPAK) 4 MG TBPK tablet Take as package instructions. 1 each 0   No facility-administered medications prior to visit.    No Known Allergies  ROS Review of Systems  Constitutional:  Positive for fatigue. Negative for chills and fever.  HENT:  Negative for congestion, sinus pressure, sinus pain and sore throat.   Eyes:  Negative for pain and discharge.  Respiratory:  Negative for cough and shortness of breath.   Cardiovascular:  Negative for chest pain and palpitations.  Gastrointestinal:  Negative for abdominal pain, constipation, diarrhea, nausea and vomiting.  Endocrine: Negative for polydipsia and polyuria.  Genitourinary:  Negative for dysuria and hematuria.  Musculoskeletal:  Positive for arthralgias, back pain and neck pain.  Skin:  Negative for rash.  Neurological:  Positive for weakness (Generalized). Negative for dizziness.  Psychiatric/Behavioral:  Negative for agitation and behavioral problems.      Objective:    Physical Exam Vitals reviewed.  Constitutional:      General: She is not in acute distress.    Appearance: She is not diaphoretic.     Comments: In wheelchair  HENT:     Head: Normocephalic and atraumatic.     Nose: Nose normal. No congestion.     Mouth/Throat:     Mouth: Mucous membranes are moist.     Pharynx: No posterior oropharyngeal erythema.  Eyes:     General: No scleral icterus.    Extraocular Movements: Extraocular movements intact.  Cardiovascular:     Rate and Rhythm: Normal rate and regular rhythm.     Pulses: Normal pulses.     Heart sounds:  Normal heart sounds. No murmur heard. Pulmonary:     Breath sounds: Normal breath sounds. No wheezing or rales.  Musculoskeletal:     Cervical back: Neck supple. No rigidity or tenderness.     Right lower leg: No edema.      Left lower leg: No edema.  Skin:    General: Skin is warm.     Findings: No rash.  Neurological:     General: No focal deficit present.     Mental Status: She is alert and oriented to person, place, and time.     Sensory: Sensory deficit present.     Motor: Weakness (3/5 in b/l LE) present.  Psychiatric:        Mood and Affect: Mood normal.        Behavior: Behavior normal.    BP 118/63 (BP Location: Left Arm, Patient Position: Sitting, Cuff Size: Normal)    Pulse (!) 51    Resp 20    Ht 5' 3"  (1.6 m)    SpO2 100%    BMI 28.45 kg/m  Wt Readings from Last 3 Encounters:  11/11/20 160 lb 9.6 oz (72.8 kg)  11/05/20 156 lb (70.8 kg)  10/14/20 156 lb 1.6 oz (70.8 kg)    Lab Results  Component Value Date   TSH 3.414 09/26/2020   Lab Results  Component Value Date   WBC 5.2 01/27/2021   HGB 12.1 01/27/2021   HCT 39.3 01/27/2021   MCV 97.0 01/27/2021   PLT 147 (L) 01/27/2021   Lab Results  Component Value Date   NA 138 01/27/2021   K 3.9 01/27/2021   CO2 22 01/27/2021   GLUCOSE 174 (H) 01/27/2021   BUN 18 01/27/2021   CREATININE 1.01 (H) 01/27/2021   BILITOT 0.8 01/29/2021   ALKPHOS 143 (H) 01/29/2021   AST 69 (H) 01/29/2021   ALT 227 (H) 01/29/2021   PROT 7.4 01/29/2021   ALBUMIN 3.7 01/29/2021   CALCIUM 8.1 (L) 01/27/2021   ANIONGAP 10 01/27/2021   EGFR 40 (L) 09/24/2020   Lab Results  Component Value Date   CHOL 162 04/13/2019   Lab Results  Component Value Date   HDL 40 (L) 04/13/2019   Lab Results  Component Value Date   LDLCALC 97 04/13/2019   Lab Results  Component Value Date   TRIG 157 (H) 04/13/2019   Lab Results  Component Value Date   CHOLHDL 4.1 04/13/2019   Lab Results  Component Value Date   HGBA1C 6.7 (H) 09/24/2020      Assessment & Plan:   Problem List Items Addressed This Visit       Cardiovascular and Mediastinum   Hypertension - Primary    BP Readings from Last 1 Encounters:  01/27/21 (!) 142/59  Well-controlled for her  age, on amlodipine, Metoprolol and Losartan-HCTZ Counseled for compliance with the medications Advised low salt diet      Carotid artery disease without cerebral infarction [facility])    asymptomatic currently On statin        Endocrine   Type 2 diabetes mellitus with diabetic neuropathy, unspecified (HCC)    Lab Results  Component Value Date   HGBA1C 6.7 (H) 09/24/2020  On metformin and glipizide Advised to follow diabetic diet On statin Diabetic eye exam: Advised to follow up with Ophthalmology for diabetic eye exam Increased gabapentin to 300 mg twice daily      Diabetic neuropathy (HCC)    Persistent leg pain even with gabapentin Increased dose  to 300 mg twice daily        Other   Multiple myeloma (HCC)    Not in remission On Revlimid Follows up with Oncology Has chronic pain, on Norco       No orders of the defined types were placed in this encounter.   Follow-up: Return in about 4 months (around 05/27/2021).    Lindell Spar, MD

## 2021-01-30 NOTE — Assessment & Plan Note (Signed)
Not in remission On Revlimid Follows up with Oncology Has chronic pain, on Norco

## 2021-01-30 NOTE — Assessment & Plan Note (Addendum)
Lab Results  Component Value Date   HGBA1C 6.7 (H) 09/24/2020   On metformin and glipizide Advised to follow diabetic diet On statin Diabetic eye exam: Advised to follow up with Ophthalmology for diabetic eye exam Increased gabapentin to 300 mg twice daily

## 2021-01-30 NOTE — Assessment & Plan Note (Signed)
BP Readings from Last 1 Encounters:  01/27/21 (!) 142/59   Well-controlled for her age, on amlodipine, Metoprolol and Losartan-HCTZ Counseled for compliance with the medications Advised low salt diet

## 2021-01-30 NOTE — Assessment & Plan Note (Signed)
Persistent leg pain even with gabapentin Increased dose to 300 mg twice daily

## 2021-01-30 NOTE — Assessment & Plan Note (Signed)
asymptomatic currently On statin 

## 2021-02-03 LAB — PROTEIN ELECTROPHORESIS, SERUM
A/G Ratio: UNDETERMINED
Albumin ELP: 3.4 g/dL (ref 2.9–4.4)
Alpha-1-Globulin: 0.3 g/dL (ref 0.0–0.4)
Alpha-2-Globulin: 0.8 g/dL (ref 0.4–1.0)
Beta Globulin: 1.2 g/dL (ref 0.7–1.3)
Gamma Globulin: 0.8 g/dL (ref 0.4–1.8)
Globulin, Total: UNDETERMINED g/dL

## 2021-02-03 LAB — IMMUNOFIXATION ELECTROPHORESIS
IgA: 488 mg/dL — ABNORMAL HIGH (ref 64–422)
IgG (Immunoglobin G), Serum: 873 mg/dL (ref 586–1602)
IgM (Immunoglobulin M), Srm: 28 mg/dL (ref 26–217)
Total Protein ELP: 6.5 g/dL (ref 6.0–8.5)

## 2021-02-03 NOTE — Progress Notes (Signed)
[place], [place] [address]   CLINIC:  Medical Oncology/Hematology  PCP:  Lindell Spar, MD [address] / [place] [address] [phone]   REASON FOR VISIT:  Follow-up for multiple myeloma & normocytic anemia  PRIOR THERAPY:   Velcade x 9 cycles from 10/29/2019 to 05/02/2020 Revlimid 2/3 weeks  NGS Results: not done  CURRENT THERAPY: surveillance  BRIEF ONCOLOGIC HISTORY:  Oncology History  Multiple myeloma not having achieved remission ([place])  10/13/2019 Initial Diagnosis   Multiple myeloma not having achieved remission ([place])   10/29/2019 -  Chemotherapy    Patient is on Treatment Plan: MYELOMA NON-TRANSPLANT CANDIDATES VRD WEEKLY Q21D         CANCER STAGING:  Cancer Staging  No matching staging information was found for the patient.  INTERVAL HISTORY:  [name], a 80 y.o. female, returns for routine follow-up of her multiple myeloma & normocytic anemia. [place] was last seen on 01/27/2021.   Today she reports feeling well. She reports oxycodone has not helped with the worsened severe pain in her knees bilaterally which radiates up into her thighs and back, and it has not caused any drowsiness. She is taking 1 oxycodone tablet every 4 hours. She reports cramping in her left which she needs assistance to straighten; when she straightens that leg there is audible popping. She cannot stand due to the pain and stiffness in her knees.   REVIEW OF SYSTEMS:  Review of Systems  Constitutional:  Negative for fatigue (50%).  Musculoskeletal:  Positive for arthralgias (knees and thighs) and back pain.  All other systems reviewed and are negative.  PAST MEDICAL/SURGICAL HISTORY:  Past Medical History:  Diagnosis Date   Arthritis    Cancer [facility])    Cataract    Chest pain    Associated with weakness and fatigue   Diabetes mellitus    A1c of 7.4 in 08/2010   GERD (gastroesophageal reflux disease)    Glaucoma     Hyperlipidemia    Lipid profile in 08/2010:190, 121, 48, 118; normal CBC and CMet   Hypertension    Lipid profile in 08/2010:190, 121, 48, 118; normal CBC and CMet   Hypothyroidism    Hypothyroidism    Obesity    Past Surgical History:  Procedure Laterality Date   CHOLECYSTECTOMY     COLONOSCOPY N/A 04/01/2014   RMR: Melanosis coli. colonic polyps removed as described above.    CYSTOSCOPY W/ URETERAL STENT PLACEMENT Right 09/13/2019   Procedure: CYSTOSCOPY WITH RIGHT  RETROGRADE PYELOGRAM; BLADDER BIOPSY;  Surgeon: Cleon Gustin, MD;  Location: [facility];  Service: Urology;  Laterality: Right;   LEFT HEART CATH AND CORONARY ANGIOGRAPHY N/A 10/07/2017   Procedure: LEFT HEART CATH AND CORONARY ANGIOGRAPHY;  Surgeon: Troy Sine, MD;  Location: [facility];  Service: Cardiovascular;  Laterality: N/A;   THYROIDECTOMY, PARTIAL      SOCIAL HISTORY:  Social History   Socioeconomic History   Marital status: Widowed    Spouse name: Not on file   Number of children: 7   Years of education: Not on file   Highest education level: Not on file  Occupational History    Employer: RETIRED  Tobacco Use   Smoking status: Never    Passive exposure: Yes   Smokeless tobacco: Never  Vaping Use   Vaping Use: Never used  Substance and Sexual Activity   Alcohol use: Never   Drug use:  Never   Sexual activity: Not Currently  Other Topics Concern   Not on file  Social History Narrative   Daughter, Corky Sing, lives with and assists patient.    Social Determinants of Health   Financial Resource Strain: Low Risk    Difficulty of Paying Living Expenses: Not hard at all  Food Insecurity: No Food Insecurity   Worried About Charity fundraiser in the Last Year: Never true   [place] in the Last Year: Never true  Transportation Needs: No Transportation Needs   Lack of Transportation (Medical): No   Lack of Transportation (Non-Medical): No  Physical Activity: Sufficiently Active   Days  of Exercise per Week: 5 days   Minutes of Exercise per Session: 30 min  Stress: No Stress Concern Present   Feeling of Stress : Not at all  Social Connections: Moderately Integrated   Frequency of Communication with Friends and Family: More than three times a week   Frequency of Social Gatherings with Friends and Family: More than three times a week   Attends Religious Services: 1 to 4 times per year   Active Member of [employer] or Organizations: Yes   Attends Archivist Meetings: 1 to 4 times per year   Marital Status: Widowed  Human resources officer Violence: Not At Risk   Fear of Current or Ex-Partner: No   Emotionally Abused: No   Physically Abused: No   Sexually Abused: No    FAMILY HISTORY:  Family History  Problem Relation Age of Onset   Anemia Father    Arthritis Mother    Stroke Brother    Cancer Sister        unknown kind   Diabetes Sister    Dementia Sister    Cancer Niece        Breast   Cancer Brother    Healthy Son    Healthy Son    Healthy Son    Healthy Daughter    Healthy Daughter    Healthy Daughter    Sarcoidosis Daughter     CURRENT MEDICATIONS:  Current Outpatient Medications  Medication Sig Dispense Refill   acyclovir (ZOVIRAX) 400 MG tablet TAKE ONE TABLET (400MG TOTAL) BY MOUTH TWICE DAILY 60 tablet 6   albuterol (VENTOLIN HFA) 108 (90 Base) MCG/ACT inhaler Inhale 2 puffs into the lungs every 6 (six) hours as needed for wheezing or shortness of breath. 8 g 0   ASPIRIN 81 PO Take 1 tablet by mouth daily.     blood glucose meter kit and supplies Dispense based on patient and insurance preference. Use up to four times daily as directed. (FOR ICD-10 E10.9, E11.9). 1 each 0   Blood Glucose Monitoring Suppl (BLOOD GLUCOSE SYSTEM PAK) KIT Please dispense based on patient and insurance preference. Use as directed to monitor FSBS 2x daily. Dx: E11.9. 1 each 1   Calcium 500 MG tablet Take 600 mg by mouth 2 (two) times daily.     fluticasone (FLONASE) 50  MCG/ACT nasal spray USE TWO SPRAYS IN BOTH NOSTRILS DAILY (Patient taking differently: Place 2 sprays into both nostrils daily.) 16 g 6   gabapentin (NEURONTIN) 300 MG capsule TAKE ONE CAPSULE BY MOUTH EVERY NIGHT AT BEDTIME 90 capsule 3   glipiZIDE (GLUCOTROL) 5 MG tablet TAKE ONE TABLET (5MG TOTAL) BY MOUTH TWOTIMES DAILY (Patient taking differently: Take 5 mg by mouth 2 (two) times daily before a meal.) 180 tablet 3   Glucose Blood (BLOOD GLUCOSE TEST STRIPS)  STRP Please dispense based on patient and insurance preference. Use as directed to monitor FSBS 2x daily. Dx: E11.9. 100 each 11   guaiFENesin-dextromethorphan (ROBITUSSIN DM) 100-10 MG/5ML syrup Take 5 mLs by mouth every 4 (four) hours as needed for cough. 118 mL 0   HM LORATADINE 10 MG tablet TAKE ONE TABLET (10MG TOTAL) BY MOUTH DAILY (Patient taking differently: Take 10 mg by mouth daily as needed for allergies.) 30 tablet 0   Lancets MISC Please dispense based on patient and insurance preference. Use as directed to monitor FSBS 2x daily. Dx: E11.9. 100 each 11   lenalidomide (REVLIMID) 10 MG capsule Take 1 capsule (10 mg total) by mouth daily. 14 days on, 7 days off 14 capsule 0   magic mouthwash w/lidocaine SOLN Take 5 mLs by mouth 4 (four) times daily. 450 mL 0   magnesium oxide (MAG-OX) 400 (240 Mg) MG tablet TAKE ONE TABLET (400MG TOTAL) BY MOUTH TWO TIMES DAILY (Patient taking differently: Take 400 mg by mouth 2 (two) times daily.) 120 tablet 1   meclizine (ANTIVERT) 25 MG tablet Take 25 mg by mouth every 6 (six) hours as needed for dizziness.     metFORMIN (GLUCOPHAGE) 1000 MG tablet TAKE ONE TABLET BY MOUTH TWICE A DAY 60 tablet 3   metoprolol tartrate (LOPRESSOR) 25 MG tablet TAKE ONE-HALF TABLET (12.5MG TOTAL) BY MOUTH TWO TIMES DAILY (Patient taking differently: Take 12.5 mg by mouth 2 (two) times daily.) 90 tablet 3   omeprazole (PRILOSEC) 40 MG capsule TAKE ONE CAPSULE BY MOUTH DAILY 90 capsule 3   polyethylene glycol (MIRALAX  / GLYCOLAX) 17 g packet Take 17 g by mouth daily. 14 each 0   potassium chloride (KLOR-CON) 10 MEQ tablet Take 2 tablets (20 mEq total) by mouth daily. 60 tablet 2   simvastatin (ZOCOR) 40 MG tablet TAKE ONE TABLET (40MG TOTAL) BY MOUTH BEDTIME (Patient taking differently: Take 40 mg by mouth at bedtime.) 90 tablet 3   SYNTHROID 137 MCG tablet TAKE ONE TABLET (137MCG TOTAL) BY MOUTH DAILY BEFORE BREAKFAST 30 tablet 0   traMADol (ULTRAM) 50 MG tablet TAKE ONE TABLET (50MG TOTAL) BY MOUTH TWO TIMES DAILY AS NEEDED 60 tablet 0   zinc oxide (MEIJER ZINC OXIDE) 20 % ointment Apply 1 application topically as needed for irritation. 56.7 g 0   oxyCODONE 10 MG TABS Take 1 tablet (10 mg total) by mouth every 6 (six) hours as needed for severe pain. 60 tablet 0   No current facility-administered medications for this visit.    ALLERGIES:  No Known Allergies  PHYSICAL EXAM:  Performance status (ECOG): 3 - Symptomatic, >50% confined to bed  Vitals:   02/04/21 1300  BP: (!) 144/80  Pulse: (!) 58  Resp: 18  Temp: 97.6 F (36.4 C)  SpO2: 98%   Wt Readings from Last 3 Encounters:  11/11/20 160 lb 9.6 oz (72.8 kg)  11/05/20 156 lb (70.8 kg)  10/14/20 156 lb 1.6 oz (70.8 kg)   Physical Exam Vitals reviewed.  Constitutional:      Appearance: Normal appearance.     Comments: In wheelchair  Cardiovascular:     Rate and Rhythm: Normal rate and regular rhythm.     Pulses: Normal pulses.     Heart sounds: Normal heart sounds.  Pulmonary:     Effort: Pulmonary effort is normal.     Breath sounds: Normal breath sounds.  Neurological:     General: No focal deficit present.  Mental Status: She is alert and oriented to person, place, and time.  Psychiatric:        Mood and Affect: Mood normal.        Behavior: Behavior normal.     LABORATORY DATA:  I have reviewed the labs as listed.  CBC Latest Ref Rng & Units 02/04/2021 01/27/2021 12/09/2020  WBC 4.0 - 10.5 K/uL 6.5 5.2 6.8  Hemoglobin  12.0 - 15.0 g/dL 11.7(L) 12.1 10.9(L)  Hematocrit 36.0 - 46.0 % 37.4 39.3 34.5(L)  Platelets 150 - 400 K/uL 198 147(L) 271   CMP Latest Ref Rng & Units 01/29/2021 01/27/2021 12/09/2020  Glucose 70 - 99 mg/dL - 174(H) 174(H)  BUN 8 - 23 mg/dL - 18 12  Creatinine 0.44 - 1.00 mg/dL - 1.01(H) 0.84  Sodium 135 - 145 mmol/L - 138 139  Potassium 3.5 - 5.1 mmol/L - 3.9 3.5  Chloride 98 - 111 mmol/L - 106 107  CO2 22 - 32 mmol/L - 22 24  Calcium 8.9 - 10.3 mg/dL - 8.1(L) 8.6(L)  Total Protein 6.5 - 8.1 g/dL 7.4 7.4 6.7  Total Bilirubin 0.3 - 1.2 mg/dL 0.8 1.0 0.8  Alkaline Phos 38 - 126 U/L 143(H) 195(H) 46  AST 15 - 41 U/L 69(H) 626(H) 36  ALT 0 - 44 U/L 227(H) 526(H) 36    DIAGNOSTIC IMAGING:  I have independently reviewed the scans and discussed with the patient. US Abdomen Limited RUQ (LIVER/GB)  Result Date: 01/27/2021 CLINICAL DATA:  Elevated liver enzymes EXAM: ULTRASOUND ABDOMEN LIMITED RIGHT UPPER QUADRANT COMPARISON:  None. FINDINGS: Gallbladder: Surgically absent Common bile duct: Diameter: 7 mm Liver: No focal lesion identified. Within normal limits in parenchymal echogenicity. Portal vein is patent on color Doppler imaging with normal direction of blood flow towards the liver. Other: None. IMPRESSION: Mildly dilated common bile duct, not unexpected status post cholecystectomy. Electronically Signed   By: Yetta Glassman M.D.   On: 01/27/2021 15:20     ASSESSMENT:  1.  Normocytic anemia: -Patient seen at the request of Dr. Buelah Manis for further work-up and management of normocytic anemia. -Recent CBC on 08/21/2019 shows hemoglobin 6.9 with MCV of 88.3.  White count and platelets are normal.  No history of CKD.  Denies any bleeding per rectum or melena. Last colonoscopy on 04/01/2014 shows diffusely pigmented rectal mucosa consistent with melanosis [place] otherwise normal mucosa.  Diffusely pigmented colonic mucosa, 2 diminutive polyps in the mid ascending segment, otherwise the remainder  of the colon mucosa was normal.  Biopsy consistent with tubular adenoma. -CT renal study on 05/11/2019 shows normal-sized spleen with normal liver.  No other abnormal adenopathy. -Denies any prior history of blood transfusion.  She is currently taking iron tablet twice daily.   2.  IgG lambda plasma cell myeloma: -Skeletal survey on 09/12/2019 shows diffuse faint lucencies in the skull, bilateral femurs. -Bone marrow biopsy on 09/27/2019 shows 70% plasma cells in hypercellular marrow.  Chromosome analysis was 59, XX. FISH panel was normal.  However quality of specimen is compromised. -LDH normal, beta-2 microglobulin 2.9.  M spike was 2.1 g.  Kappa light chains 19.7, lambda light chains 14.3 with ratio of 1.38. -24-hour urine shows nonnephrotic range proteinuria.  Urine immunofixation was positive. - 9 cycles of RVD from 10/29/2019 through 05/02/2020. - Because of her age and comorbidities, she was not considered a candidate for bone marrow transplant.   PLAN:  1.  IgG lambda plasma cell myeloma: - Reviewed myeloma panel from 01/27/2021.  M  spike is negative.  Immunofixation was normal.  Light chain ratio is 1.31.  Light chains 47 and lambda light chains 36. - We are currently holding her Revlimid because of elevated LFTs.  Today LFTs show ALT still mildly elevated at 45.  AST is normal.  Total bilirubin was normal. - Recommend continue to hold Revlimid at this time. - Recommend MRI of the lumbar spine with and without contrast due to low back pain and leg weakness, although it can be due to knee problems. - RTC 2 weeks for follow-up.  Will likely start her back on Revlimid at next visit in 2 weeks.   2.  Diabetes: - Continue glipizide and metformin.  We will continue to hold Zocor at this time.   3.  Hypertension: - Continue amlodipine and HCTZ.   4.  Weight loss: - She has lost some weight since May of this year.  However we could not get a weight on her today as she is unable to stand.   5.   Diarrhea: - Diarrhea is better since we cut back on the magnesium to once daily.   6.  Bilateral knee pains: - She reports worsening of knee pain and leg pain since 01/24/2021. - She is currently taking oxycodone every 6 hours as needed.  She reports it is lasting only couple of hours. - Will increase oxycodone to 10 mg every 6 hours as needed. - She also reports some low back pain.  Because of her myeloma, recommend MRI of the lumbar spine with and without contrast.   7.  Infection prophylaxis: - Today her LFTs improved with normal AST and ALT improved to 45. - We will consider restarting acyclovir at next visit.   8.  Bone protection: - Today calcium is at 9.0 with almond 3.7. - Will likely resume her Xgeva in 2 weeks.  9.  Hypokalemia: - Potassium is 4.2.  Continue potassium 20 mEq daily.   Orders placed this encounter:  No orders of the defined types were placed in this encounter.    Derek Jack, MD [facility] [phone]   I, Thana Ates, am acting as a scribe for Dr. Derek Jack.  I, Derek Jack MD, have reviewed the above documentation for accuracy and completeness, and I agree with the above.

## 2021-02-04 ENCOUNTER — Encounter (HOSPITAL_COMMUNITY): Payer: Self-pay | Admitting: Hematology

## 2021-02-04 ENCOUNTER — Inpatient Hospital Stay (HOSPITAL_BASED_OUTPATIENT_CLINIC_OR_DEPARTMENT_OTHER): Payer: Medicare Other | Admitting: Hematology

## 2021-02-04 ENCOUNTER — Other Ambulatory Visit: Payer: Self-pay

## 2021-02-04 ENCOUNTER — Inpatient Hospital Stay (HOSPITAL_COMMUNITY): Payer: Medicare Other

## 2021-02-04 ENCOUNTER — Other Ambulatory Visit (HOSPITAL_COMMUNITY): Payer: Self-pay

## 2021-02-04 VITALS — BP 144/80 | HR 58 | Temp 97.6°F | Resp 18 | Ht 63.0 in

## 2021-02-04 DIAGNOSIS — D649 Anemia, unspecified: Secondary | ICD-10-CM | POA: Diagnosis not present

## 2021-02-04 DIAGNOSIS — C9000 Multiple myeloma not having achieved remission: Secondary | ICD-10-CM

## 2021-02-04 DIAGNOSIS — M25562 Pain in left knee: Secondary | ICD-10-CM | POA: Diagnosis not present

## 2021-02-04 DIAGNOSIS — E876 Hypokalemia: Secondary | ICD-10-CM | POA: Diagnosis not present

## 2021-02-04 DIAGNOSIS — R748 Abnormal levels of other serum enzymes: Secondary | ICD-10-CM | POA: Diagnosis not present

## 2021-02-04 DIAGNOSIS — E119 Type 2 diabetes mellitus without complications: Secondary | ICD-10-CM | POA: Diagnosis not present

## 2021-02-04 DIAGNOSIS — M25561 Pain in right knee: Secondary | ICD-10-CM | POA: Diagnosis not present

## 2021-02-04 LAB — COMPREHENSIVE METABOLIC PANEL
ALT: 45 U/L — ABNORMAL HIGH (ref 0–44)
AST: 18 U/L (ref 15–41)
Albumin: 3.7 g/dL (ref 3.5–5.0)
Alkaline Phosphatase: 75 U/L (ref 38–126)
Anion gap: 7 (ref 5–15)
BUN: 18 mg/dL (ref 8–23)
CO2: 26 mmol/L (ref 22–32)
Calcium: 9 mg/dL (ref 8.9–10.3)
Chloride: 110 mmol/L (ref 98–111)
Creatinine, Ser: 0.82 mg/dL (ref 0.44–1.00)
GFR, Estimated: >60 mL/min (ref >60)
Glucose, Bld: 91 mg/dL (ref 70–99)
Potassium: 4.2 mmol/L (ref 3.5–5.1)
Sodium: 143 mmol/L (ref 135–145)
Total Bilirubin: 0.9 mg/dL (ref 0.3–1.2)
Total Protein: 6.9 g/dL (ref 6.5–8.1)

## 2021-02-04 LAB — CBC WITH DIFFERENTIAL/PLATELET
Abs Immature Granulocytes: 0.02 10*3/uL (ref 0.00–0.07)
Basophils Absolute: 0.1 10*3/uL (ref 0.0–0.1)
Basophils Relative: 1 %
Eosinophils Absolute: 0.3 10*3/uL (ref 0.0–0.5)
Eosinophils Relative: 4 %
HCT: 37.4 % (ref 36.0–46.0)
Hemoglobin: 11.7 g/dL — ABNORMAL LOW (ref 12.0–15.0)
Immature Granulocytes: 0 %
Lymphocytes Relative: 54 %
Lymphs Abs: 3.4 10*3/uL (ref 0.7–4.0)
MCH: 29.6 pg (ref 26.0–34.0)
MCHC: 31.3 g/dL (ref 30.0–36.0)
MCV: 94.7 fL (ref 80.0–100.0)
Monocytes Absolute: 0.6 10*3/uL (ref 0.1–1.0)
Monocytes Relative: 9 %
Neutro Abs: 2.1 10*3/uL (ref 1.7–7.7)
Neutrophils Relative %: 32 %
Platelets: 198 10*3/uL (ref 150–400)
RBC: 3.95 MIL/uL (ref 3.87–5.11)
RDW: 16.4 % — ABNORMAL HIGH (ref 11.5–15.5)
WBC: 6.5 10*3/uL (ref 4.0–10.5)
nRBC: 0 % (ref 0.0–0.2)

## 2021-02-04 LAB — MAGNESIUM: Magnesium: 1.7 mg/dL (ref 1.7–2.4)

## 2021-02-04 MED ORDER — LENALIDOMIDE 10 MG PO CAPS: 10.0000 mg | ORAL_CAPSULE | Freq: Every day | ORAL | 0 refills | Status: DC

## 2021-02-04 MED ORDER — OXYCODONE HCL 10 MG PO TABS: 10.0000 mg | ORAL_TABLET | Freq: Four times a day (QID) | ORAL | 0 refills | Status: DC | PRN

## 2021-02-04 NOTE — Patient Instructions (Addendum)
[place] at [facility] Discharge Instructions   You were seen and examined today by Dr. Delton Coombes. He reviewed your lab work - your multiple myeloma labs look good. Continue to hold Revlimid for 2 more weeks. We will continue with Xgeva injections in 2 weeks and every 4 weeks.  Oxycodone is increased to 10 mg every 6 hours as needed. We will obtain an MRI of your lower back to rule out causes for your pain.  Return as scheduled in 2 weeks.    Thank you for choosing [place] at [facility] to provide your oncology and hematology care.  To afford each patient quality time with our provider, please arrive at least 15 minutes before your scheduled appointment time.   If you have a lab appointment with the [facility] please come in thru the Main Entrance and check in at the main information desk.  You need to re-schedule your appointment should you arrive 10 or more minutes late.  We strive to give you quality time with our providers, and arriving late affects you and other patients whose appointments are after yours.  Also, if you no show three or more times for appointments you may be dismissed from the clinic at the providers discretion.     Again, thank you for choosing [facility].  Our hope is that these requests will decrease the amount of time that you wait before being seen by our physicians.       _____________________________________________________________  Should you have questions after your visit to [facility], please contact our office at [phone] and follow the prompts.  Our office hours are 8:00 a.m. and 4:30 p.m. Monday - Friday.  Please note that voicemails left after 4:00 p.m. may not be returned until the following business day.  We are closed weekends and major holidays.  You do have access to a nurse 24-7, just call the main number to the clinic [phone] and do not press any options,  hold on the line and a nurse will answer the phone.    For prescription refill requests, have your pharmacy contact our office and allow 72 hours.    Due to Covid, you will need to wear a mask upon entering the hospital. If you do not have a mask, a mask will be given to you at the Main Entrance upon arrival. For doctor visits, patients may have 1 support person age 62 or older with them. For treatment visits, patients can not have anyone with them due to social distancing guidelines and our immunocompromised population.

## 2021-02-04 NOTE — Telephone Encounter (Signed)
Chart reviewed. Revlimid refilled per last office note with Dr. Katragadda.  

## 2021-02-09 ENCOUNTER — Other Ambulatory Visit: Payer: Self-pay | Admitting: Internal Medicine

## 2021-02-12 ENCOUNTER — Other Ambulatory Visit: Payer: Self-pay

## 2021-02-12 ENCOUNTER — Ambulatory Visit (HOSPITAL_COMMUNITY)
Admission: RE | Admit: 2021-02-12 | Discharge: 2021-02-12 | Disposition: A | Discharge status: Home / Self Care | Payer: Medicare Other | Source: Ambulatory Visit | Attending: Hematology | Admitting: Hematology

## 2021-02-12 ENCOUNTER — Inpatient Hospital Stay (HOSPITAL_COMMUNITY): Payer: Medicare Other | Attending: Hematology

## 2021-02-12 DIAGNOSIS — C9000 Multiple myeloma not having achieved remission: Secondary | ICD-10-CM | POA: Diagnosis not present

## 2021-02-12 DIAGNOSIS — M48061 Spinal stenosis, lumbar region without neurogenic claudication: Secondary | ICD-10-CM | POA: Diagnosis not present

## 2021-02-12 DIAGNOSIS — M5136 Other intervertebral disc degeneration, lumbar region: Secondary | ICD-10-CM | POA: Diagnosis not present

## 2021-02-12 DIAGNOSIS — M5126 Other intervertebral disc displacement, lumbar region: Secondary | ICD-10-CM | POA: Diagnosis not present

## 2021-02-12 LAB — COMPREHENSIVE METABOLIC PANEL
ALT: 27 U/L (ref 0–44)
AST: 24 U/L (ref 15–41)
Albumin: 3.6 g/dL (ref 3.5–5.0)
Alkaline Phosphatase: 78 U/L (ref 38–126)
Anion gap: 8 (ref 5–15)
BUN: 16 mg/dL (ref 8–23)
CO2: 24 mmol/L (ref 22–32)
Calcium: 8.7 mg/dL — ABNORMAL LOW (ref 8.9–10.3)
Chloride: 109 mmol/L (ref 98–111)
Creatinine, Ser: 1.01 mg/dL — ABNORMAL HIGH (ref 0.44–1.00)
GFR, Estimated: 57 mL/min — ABNORMAL LOW (ref >60)
Glucose, Bld: 139 mg/dL — ABNORMAL HIGH (ref 70–99)
Potassium: 4.4 mmol/L (ref 3.5–5.1)
Sodium: 141 mmol/L (ref 135–145)
Total Bilirubin: 0.6 mg/dL (ref 0.3–1.2)
Total Protein: 6.8 g/dL (ref 6.5–8.1)

## 2021-02-12 LAB — CBC WITH DIFFERENTIAL/PLATELET
Abs Immature Granulocytes: 0.05 10*3/uL (ref 0.00–0.07)
Basophils Absolute: 0.1 10*3/uL (ref 0.0–0.1)
Basophils Relative: 1 %
Eosinophils Absolute: 0.2 10*3/uL (ref 0.0–0.5)
Eosinophils Relative: 3 %
HCT: 40 % (ref 36.0–46.0)
Hemoglobin: 12.3 g/dL (ref 12.0–15.0)
Immature Granulocytes: 1 %
Lymphocytes Relative: 40 %
Lymphs Abs: 2.9 10*3/uL (ref 0.7–4.0)
MCH: 29.6 pg (ref 26.0–34.0)
MCHC: 30.8 g/dL (ref 30.0–36.0)
MCV: 96.2 fL (ref 80.0–100.0)
Monocytes Absolute: 0.4 10*3/uL (ref 0.1–1.0)
Monocytes Relative: 6 %
Neutro Abs: 3.6 10*3/uL (ref 1.7–7.7)
Neutrophils Relative %: 49 %
Platelets: 272 10*3/uL (ref 150–400)
RBC: 4.16 MIL/uL (ref 3.87–5.11)
RDW: 16.8 % — ABNORMAL HIGH (ref 11.5–15.5)
WBC: 7.2 10*3/uL (ref 4.0–10.5)
nRBC: 0 % (ref 0.0–0.2)

## 2021-02-12 LAB — MAGNESIUM: Magnesium: 1.8 mg/dL (ref 1.7–2.4)

## 2021-02-12 MED ORDER — GADOBUTROL 1 MMOL/ML IV SOLN
7.0000 mL | Freq: Once | INTRAVENOUS | Status: AC | PRN
  Administered 2021-02-12: 7 mL via INTRAVENOUS

## 2021-02-17 ENCOUNTER — Inpatient Hospital Stay (HOSPITAL_BASED_OUTPATIENT_CLINIC_OR_DEPARTMENT_OTHER): Payer: Medicare Other | Admitting: Hematology

## 2021-02-17 ENCOUNTER — Other Ambulatory Visit: Payer: Self-pay

## 2021-02-17 ENCOUNTER — Inpatient Hospital Stay (HOSPITAL_COMMUNITY): Payer: Medicare Other

## 2021-02-17 VITALS — BP 195/71 | HR 54 | Temp 99.4°F | Resp 18 | Ht 63.0 in | Wt 155.0 lb

## 2021-02-17 DIAGNOSIS — L89309 Pressure ulcer of unspecified buttock, unspecified stage: Secondary | ICD-10-CM

## 2021-02-17 DIAGNOSIS — R748 Abnormal levels of other serum enzymes: Secondary | ICD-10-CM | POA: Diagnosis not present

## 2021-02-17 DIAGNOSIS — C9000 Multiple myeloma not having achieved remission: Secondary | ICD-10-CM

## 2021-02-17 DIAGNOSIS — D649 Anemia, unspecified: Secondary | ICD-10-CM

## 2021-02-17 MED ORDER — TRAMADOL HCL 50 MG PO TABS: 50.0000 mg | ORAL_TABLET | Freq: Four times a day (QID) | ORAL | 0 refills | Status: DC | PRN

## 2021-02-17 MED ORDER — DENOSUMAB 120 MG/1.7ML ~~LOC~~ SOLN
120.0000 mg | Freq: Once | SUBCUTANEOUS | Status: AC
  Administered 2021-02-17: 120 mg via SUBCUTANEOUS
  Filled 2021-02-17: qty 1.7

## 2021-02-17 NOTE — Progress Notes (Signed)
Patient taking calcium as directed.  Denied tooth, jaw, and leg pain.  No recent or upcoming dental visits.  Labs reviewed.  Patient tolerated injection with no complaints voiced.  See MAR for details.  Patient stable during and after injection.  Site clean and dry with no bruising or swelling noted.  Band aid applied.  Vss with discharge and left in satisfactory condition with no s/s of distress noted.   

## 2021-02-17 NOTE — Patient Instructions (Addendum)
[place] at [facility] Discharge Instructions  You were seen and examined today by Dr. Delton Coombes. He reviewed your most recent labs and everything looks good. Your scan shows that you have severe stenosis in your back. Hold Revlimid since your myeloma numbers are looking good. Start taking the Tramadol 50 mg every 6 hours. Please keep follow up appointment as scheduled in 3-4 weeks.   Thank you for choosing [place] at [facility] to provide your oncology and hematology care.  To afford each patient quality time with our provider, please arrive at least 15 minutes before your scheduled appointment time.   If you have a lab appointment with the [facility] please come in thru the Main Entrance and check in at the main information desk.  You need to re-schedule your appointment should you arrive 10 or more minutes late.  We strive to give you quality time with our providers, and arriving late affects you and other patients whose appointments are after yours.  Also, if you no show three or more times for appointments you may be dismissed from the clinic at the providers discretion.     Again, thank you for choosing [facility].  Our hope is that these requests will decrease the amount of time that you wait before being seen by our physicians.       _____________________________________________________________  Should you have questions after your visit to [facility], please contact our office at [phone] and follow the prompts.  Our office hours are 8:00 a.m. and 4:30 p.m. Monday - Friday.  Please note that voicemails left after 4:00 p.m. may not be returned until the following business day.  We are closed weekends and major holidays.  You do have access to a nurse 24-7, just call the main number to the clinic [phone] and do not press any options, hold on the line and a nurse will answer the phone.    For  prescription refill requests, have your pharmacy contact our office and allow 72 hours.    Due to Covid, you will need to wear a mask upon entering the hospital. If you do not have a mask, a mask will be given to you at the Main Entrance upon arrival. For doctor visits, patients may have 1 support person age 72 or older with them. For treatment visits, patients can not have anyone with them due to social distancing guidelines and our immunocompromised population.

## 2021-02-17 NOTE — Progress Notes (Signed)
[place], [place] [address]   CLINIC:  Medical Oncology/Hematology  PCP:  Lindell Spar, MD [address] / [place] [address]  [phone]  REASON FOR VISIT:  Follow-up for multiple myeloma & normocytic anemia  PRIOR THERAPY:   Velcade x 9 cycles from 10/29/2019 to 05/02/2020 Revlimid 2/3 weeks  CURRENT THERAPY: surveillance  INTERVAL HISTORY:  Kim Mills, a 81 y.o. female, returns for routine follow-up for her multiple myeloma & normocytic anemia. Kim Mills was last seen on 02/04/2021.  Today she reports feeling fair. She reports fecal incontinence. She denies diarrhea and constipation. She reports increased full body pain which has not been helped by Oxycodone. She is unable to stable stand upright for more than 2 minutes. She is taking Gabapentin BID. She continues to have lower back pain. She reports a sore on her buttock, and she denies any associated pain.   REVIEW OF SYSTEMS:  Review of Systems  Constitutional:  Positive for fatigue. Negative for appetite change.  Gastrointestinal:  Positive for vomiting. Negative for constipation and diarrhea.  Genitourinary:  Positive for bladder incontinence (stool) and frequency.   Musculoskeletal:  Positive for arthralgias (9/10 legs and knees) and back pain.  Skin:        Sore on buttock  Neurological:  Positive for dizziness and numbness.  All other systems reviewed and are negative.  PAST MEDICAL/SURGICAL HISTORY:  Past Medical History:  Diagnosis Date   Arthritis    Cancer [facility])    Cataract    Chest pain    Associated with weakness and fatigue   Diabetes mellitus    A1c of 7.4 in 08/2010   GERD (gastroesophageal reflux disease)    Glaucoma    Hyperlipidemia    Lipid profile in 08/2010:190, 121, 48, 118; normal CBC and CMet   Hypertension    Lipid profile in 08/2010:190, 121, 48, 118; normal CBC and CMet   Hypothyroidism    Hypothyroidism    Obesity    Past  Surgical History:  Procedure Laterality Date   CHOLECYSTECTOMY     COLONOSCOPY N/A 04/01/2014   RMR: Melanosis coli. colonic polyps removed as described above.    CYSTOSCOPY W/ URETERAL STENT PLACEMENT Right 09/13/2019   Procedure: CYSTOSCOPY WITH RIGHT  RETROGRADE PYELOGRAM; BLADDER BIOPSY;  Surgeon: Cleon Gustin, MD;  Location: [facility];  Service: Urology;  Laterality: Right;   LEFT HEART CATH AND CORONARY ANGIOGRAPHY N/A 10/07/2017   Procedure: LEFT HEART CATH AND CORONARY ANGIOGRAPHY;  Surgeon: Troy Sine, MD;  Location: [facility];  Service: Cardiovascular;  Laterality: N/A;   THYROIDECTOMY, PARTIAL      SOCIAL HISTORY:  Social History   Socioeconomic History   Marital status: Widowed    Spouse name: Not on file   Number of children: 7   Years of education: Not on file   Highest education level: Not on file  Occupational History    Employer: RETIRED  Tobacco Use   Smoking status: Never    Passive exposure: Yes   Smokeless tobacco: Never  Vaping Use   Vaping Use: Never used  Substance and Sexual Activity   Alcohol use: Never   Drug use: Never   Sexual activity: Not Currently  Other Topics Concern   Not on file  Social History Narrative   Daughter, Corky Sing, lives with and assists patient.    Social Determinants of Health   Financial Resource Strain: Low Risk  Difficulty of Paying Living Expenses: Not hard at all  Food Insecurity: No Food Insecurity   Worried About Fossil in the Last Year: Never true   Ran Out of Food in the Last Year: Never true  Transportation Needs: No Transportation Needs   Lack of Transportation (Medical): No   Lack of Transportation (Non-Medical): No  Physical Activity: Sufficiently Active   Days of Exercise per Week: 5 days   Minutes of Exercise per Session: 30 min  Stress: No Stress Concern Present   Feeling of Stress : Not at all  Social Connections: Moderately Integrated   Frequency of Communication with  Friends and Family: More than three times a week   Frequency of Social Gatherings with Friends and Family: More than three times a week   Attends Religious Services: 1 to 4 times per year   Active Member of [employer] or Organizations: Yes   Attends Archivist Meetings: 1 to 4 times per year   Marital Status: Widowed  Human resources officer Violence: Not At Risk   Fear of Current or Ex-Partner: No   Emotionally Abused: No   Physically Abused: No   Sexually Abused: No    FAMILY HISTORY:  Family History  Problem Relation Age of Onset   Anemia Father    Arthritis Mother    Stroke Brother    Cancer Sister        unknown kind   Diabetes Sister    Dementia Sister    Cancer Niece        Breast   Cancer Brother    Healthy Son    Healthy Son    Healthy Son    Healthy Daughter    Healthy Daughter    Healthy Daughter    Sarcoidosis Daughter     CURRENT MEDICATIONS:  Current Outpatient Medications  Medication Sig Dispense Refill   acyclovir (ZOVIRAX) 400 MG tablet TAKE ONE TABLET (400MG TOTAL) BY MOUTH TWICE DAILY 60 tablet 6   albuterol (VENTOLIN HFA) 108 (90 Base) MCG/ACT inhaler Inhale 2 puffs into the lungs every 6 (six) hours as needed for wheezing or shortness of breath. 8 g 0   ASPIRIN 81 PO Take 1 tablet by mouth daily.     blood glucose meter kit and supplies Dispense based on patient and insurance preference. Use up to four times daily as directed. (FOR ICD-10 E10.9, E11.9). 1 each 0   Blood Glucose Monitoring Suppl (BLOOD GLUCOSE SYSTEM PAK) KIT Please dispense based on patient and insurance preference. Use as directed to monitor FSBS 2x daily. Dx: E11.9. 1 each 1   Calcium 500 MG tablet Take 600 mg by mouth 2 (two) times daily.     fluticasone (FLONASE) 50 MCG/ACT nasal spray USE TWO SPRAYS IN BOTH NOSTRILS DAILY (Patient taking differently: Place 2 sprays into both nostrils daily.) 16 g 6   gabapentin (NEURONTIN) 300 MG capsule TAKE ONE CAPSULE BY MOUTH EVERY NIGHT AT  BEDTIME 90 capsule 3   glipiZIDE (GLUCOTROL) 5 MG tablet TAKE ONE TABLET (5MG TOTAL) BY MOUTH TWOTIMES DAILY (Patient taking differently: Take 5 mg by mouth 2 (two) times daily before a meal.) 180 tablet 3   Glucose Blood (BLOOD GLUCOSE TEST STRIPS) STRP Please dispense based on patient and insurance preference. Use as directed to monitor FSBS 2x daily. Dx: E11.9. 100 each 11   guaiFENesin-dextromethorphan (ROBITUSSIN DM) 100-10 MG/5ML syrup Take 5 mLs by mouth every 4 (four) hours as needed for cough. 118 mL 0  HM LORATADINE 10 MG tablet TAKE ONE TABLET (10MG TOTAL) BY MOUTH DAILY (Patient taking differently: Take 10 mg by mouth daily as needed for allergies.) 30 tablet 0   Lancets MISC Please dispense based on patient and insurance preference. Use as directed to monitor FSBS 2x daily. Dx: E11.9. 100 each 11   lenalidomide (REVLIMID) 10 MG capsule Take 1 capsule (10 mg total) by mouth daily. 14 days on, 7 days off 14 capsule 0   magic mouthwash w/lidocaine SOLN Take 5 mLs by mouth 4 (four) times daily. 450 mL 0   magnesium oxide (MAG-OX) 400 (240 Mg) MG tablet TAKE ONE TABLET (400MG TOTAL) BY MOUTH TWO TIMES DAILY (Patient taking differently: Take 400 mg by mouth 2 (two) times daily.) 120 tablet 1   meclizine (ANTIVERT) 25 MG tablet Take 25 mg by mouth every 6 (six) hours as needed for dizziness.     metFORMIN (GLUCOPHAGE) 1000 MG tablet TAKE ONE TABLET BY MOUTH TWICE A DAY 60 tablet 3   metoprolol tartrate (LOPRESSOR) 25 MG tablet TAKE ONE-HALF TABLET (12.5MG TOTAL) BY MOUTH TWO TIMES DAILY (Patient taking differently: Take 12.5 mg by mouth 2 (two) times daily.) 90 tablet 3   omeprazole (PRILOSEC) 40 MG capsule TAKE ONE CAPSULE BY MOUTH DAILY 90 capsule 3   oxyCODONE 10 MG TABS Take 1 tablet (10 mg total) by mouth every 6 (six) hours as needed for severe pain. 60 tablet 0   polyethylene glycol (MIRALAX / GLYCOLAX) 17 g packet Take 17 g by mouth daily. 14 each 0   potassium chloride (KLOR-CON) 10  MEQ tablet Take 2 tablets (20 mEq total) by mouth daily. 60 tablet 2   simvastatin (ZOCOR) 40 MG tablet TAKE ONE TABLET (40MG TOTAL) BY MOUTH BEDTIME (Patient taking differently: Take 40 mg by mouth at bedtime.) 90 tablet 3   SYNTHROID 137 MCG tablet TAKE ONE TABLET (137MCG TOTAL) BY MOUTH DAILY BEFORE BREAKFAST 30 tablet 0   zinc oxide (MEIJER ZINC OXIDE) 20 % ointment Apply 1 application topically as needed for irritation. 56.7 g 0   traMADol (ULTRAM) 50 MG tablet Take 1 tablet (50 mg total) by mouth every 6 (six) hours as needed. 120 tablet 0   No current facility-administered medications for this visit.   Facility-Administered Medications Ordered in Other Visits  Medication Dose Route Frequency Provider Last Rate Last Admin   denosumab (XGEVA) injection 120 mg  120 mg Subcutaneous Once Derek Jack, MD        ALLERGIES:  No Known Allergies  PHYSICAL EXAM:  Performance status (ECOG): 3 - Symptomatic, >50% confined to bed  Vitals:   02/17/21 1406  BP: (!) 195/71  Pulse: (!) 54  Resp: 18  Temp: 99.4 F (37.4 C)  SpO2: 97%   Wt Readings from Last 3 Encounters:  02/17/21 155 lb (70.3 kg)  11/11/20 160 lb 9.6 oz (72.8 kg)  11/05/20 156 lb (70.8 kg)   Physical Exam Vitals reviewed.  Constitutional:      Appearance: Normal appearance.     Comments: In wheelchair  Cardiovascular:     Rate and Rhythm: Normal rate and regular rhythm.     Pulses: Normal pulses.     Heart sounds: Normal heart sounds.  Pulmonary:     Effort: Pulmonary effort is normal.     Breath sounds: Normal breath sounds.  Skin:    Findings: Lesion (R buttock 1 cm area of skin degradation) present.  Neurological:     General: No focal deficit  present.     Mental Status: She is alert and oriented to person, place, and time.  Psychiatric:        Mood and Affect: Mood normal.        Behavior: Behavior normal.    LABORATORY DATA:  I have reviewed the labs as listed.  CBC Latest Ref Rng & Units  02/12/2021 02/04/2021 01/27/2021  WBC 4.0 - 10.5 K/uL 7.2 6.5 5.2  Hemoglobin 12.0 - 15.0 g/dL 12.3 11.7(L) 12.1  Hematocrit 36.0 - 46.0 % 40.0 37.4 39.3  Platelets 150 - 400 K/uL 272 198 147(L)   CMP Latest Ref Rng & Units 02/12/2021 02/04/2021 01/29/2021  Glucose 70 - 99 mg/dL 139(H) 91 -  BUN 8 - 23 mg/dL 16 18 -  Creatinine 0.44 - 1.00 mg/dL 1.01(H) 0.82 -  Sodium 135 - 145 mmol/L 141 143 -  Potassium 3.5 - 5.1 mmol/L 4.4 4.2 -  Chloride 98 - 111 mmol/L 109 110 -  CO2 22 - 32 mmol/L 24 26 -  Calcium 8.9 - 10.3 mg/dL 8.7(L) 9.0 -  Total Protein 6.5 - 8.1 g/dL 6.8 6.9 7.4  Total Bilirubin 0.3 - 1.2 mg/dL 0.6 0.9 0.8  Alkaline Phos 38 - 126 U/L 78 75 143(H)  AST 15 - 41 U/L 24 18 69(H)  ALT 0 - 44 U/L 27 45(H) 227(H)      Component Value Date/Time   RBC 4.16 02/12/2021 1406   MCV 96.2 02/12/2021 1406   MCH 29.6 02/12/2021 1406   MCHC 30.8 02/12/2021 1406   RDW 16.8 (H) 02/12/2021 1406   LYMPHSABS 2.9 02/12/2021 1406   MONOABS 0.4 02/12/2021 1406   EOSABS 0.2 02/12/2021 1406   BASOSABS 0.1 02/12/2021 1406    DIAGNOSTIC IMAGING:  I have independently reviewed the scans and discussed with the patient. MR Lumbar Spine W Wo Contrast  Result Date: 02/12/2021 CLINICAL DATA:  Multiple myeloma.  Low back pain and leg weakness EXAM: MRI LUMBAR SPINE WITHOUT AND WITH CONTRAST TECHNIQUE: Multiplanar and multiecho pulse sequences of the lumbar spine were obtained without and with intravenous contrast. CONTRAST:  33m GADAVIST GADOBUTROL 1 MMOL/ML IV SOLN COMPARISON:  CT lumbar spine 07/08/2020 FINDINGS: Segmentation:  5 lumbar vertebra. Alignment:  Mild anterolisthesis L3-4 L4-5 Vertebrae: Fatty bone marrow diffusely. No focal mass or fracture. No enhancing lesion in the bone marrow. Conus medullaris and cauda equina: Conus extends to the L2 level. Conus and cauda equina appear normal. Paraspinal and other soft tissues: Small bilateral renal cysts. No paraspinous mass or adenopathy Common bile  duct is dilated at 12 mm. Common bile duct measures 7 mm on abdominal ultrasound of 01/27/2021. Postop cholecystectomy. Disc levels: T12-L1: Chronic left-sided disc protrusion and spurring with mild left-sided foraminal stenosis. L1-2: Mild disc degeneration. Mild spurring on the left with mild foraminal narrowing. L2-3: Disc degeneration with disc bulging and endplate spurring. Bilateral facet hypertrophy. Mild spinal stenosis. Mild to moderate subarticular stenosis on the left. L3-4: Disc degeneration with disc space narrowing and diffuse endplate spurring. Moderate facet degeneration. Severe spinal stenosis. Severe subarticular and foraminal stenosis on the right. Severe subarticular stenosis on the left with moderate left foraminal stenosis L4-5: Mild anterolisthesis. Diffuse disc bulging and bilateral facet degeneration. Severe spinal stenosis. Severe subarticular stenosis bilaterally. Moderate right foraminal stenosis L5-S1: Disc degeneration with disc space narrowing and diffuse endplate spurring. Moderate subarticular stenosis bilaterally. IMPRESSION: 1. Negative for multiple myeloma lumbar spine.  No fracture 2. Multilevel chronic degenerative changes lumbar spine causing spinal and foraminal  stenosis. Severe stenosis at L3-4 and L4-5 3. Dilated common bile duct 12 mm. Correlate with liver function tests. Electronically Signed   By: Franchot Gallo M.D.   On: 02/12/2021 16:26   US Abdomen Limited RUQ (LIVER/GB)  Result Date: 01/27/2021 CLINICAL DATA:  Elevated liver enzymes EXAM: ULTRASOUND ABDOMEN LIMITED RIGHT UPPER QUADRANT COMPARISON:  None. FINDINGS: Gallbladder: Surgically absent Common bile duct: Diameter: 7 mm Liver: No focal lesion identified. Within normal limits in parenchymal echogenicity. Portal vein is patent on color Doppler imaging with normal direction of blood flow towards the liver. Other: None. IMPRESSION: Mildly dilated common bile duct, not unexpected status post cholecystectomy.  Electronically Signed   By: Yetta Glassman M.D.   On: 01/27/2021 15:20     ASSESSMENT:  1.  Normocytic anemia: -Patient seen at the request of Dr. Buelah Manis for further work-up and management of normocytic anemia. -Recent CBC on 08/21/2019 shows hemoglobin 6.9 with MCV of 88.3.  White count and platelets are normal.  No history of CKD.  Denies any bleeding per rectum or melena. Last colonoscopy on 04/01/2014 shows diffusely pigmented rectal mucosa consistent with melanosis [place] otherwise normal mucosa.  Diffusely pigmented colonic mucosa, 2 diminutive polyps in the mid ascending segment, otherwise the remainder of the colon mucosa was normal.  Biopsy consistent with tubular adenoma. -CT renal study on 05/11/2019 shows normal-sized spleen with normal liver.  No other abnormal adenopathy. -Denies any prior history of blood transfusion.  She is currently taking iron tablet twice daily.   2.  IgG lambda plasma cell myeloma: -Skeletal survey on 09/12/2019 shows diffuse faint lucencies in the skull, bilateral femurs. -Bone marrow biopsy on 09/27/2019 shows 70% plasma cells in hypercellular marrow.  Chromosome analysis was 51, XX. FISH panel was normal.  However quality of specimen is compromised. -LDH normal, beta-2 microglobulin 2.9.  M spike was 2.1 g.  Kappa light chains 19.7, lambda light chains 14.3 with ratio of 1.38. -24-hour urine shows nonnephrotic range proteinuria.  Urine immunofixation was positive. - 9 cycles of RVD from 10/29/2019 through 05/02/2020. - Because of her age and comorbidities, she was not considered a candidate for bone marrow transplant.   PLAN:  1.  IgG lambda plasma cell myeloma: - Myeloma labs from 01/27/2021 showed M spike is negative.  Immunofixation normal.  Light chain ratio is 1.31.  Lambda light chains are 36. - Reviewed labs today which showed normal LFTs.  Creatinine mildly elevated at 1.01 and calcium 8.7.  CBC was normal. - Would continue to hold Revlimid at this time  due to feeling weak. - She has developed a grade 1 decubitus ulcer in the right buttock region.  We will make a referral to home health. - RTC 3 to 4 weeks for follow-up to reevaluate to see if we can restart on Revlimid.   2.  Diabetes: - Continue glipizide and metformin.  Continue to hold Zocor.   3.  Hypertension: - Continue amlodipine and HCTZ.   4.  Weight loss: - Weight today is 155, more or less stable in the last 6 months.   5.  Diarrhea: - Diarrhea improved once we cut back on magnesium to once daily.   6.  Generalized body pains: - She was originally taking tramadol 50 mg twice daily for knee pains and leg pains. - As her pains have worsened, we have started her on oxycodone.  She is not having great pain relief from oxycodone.  She is also afraid that she might get addicted  to it. - She also reported low back pain and leg pains.  Hence we ordered an MRI of the lumbar spine. - No myeloma disease was seen in lumbar spine.  Multilevel chronic degenerative changes in the lumbar spine with severe stenosis at L3-4 and L4-5. - I have offered neurosurgical evaluation.  She did not want to do it at this time. - We will change her back to tramadol 50 mg every 6 hours.  She will also take Tylenol alternating with ibuprofen.   7.  Infection prophylaxis: - Her LFTs have improved to normal. - Acyclovir on hold since elevated LFTs.   8.  Bone protection: - Calcium is 8.7 with albumin 3.6. - We will resume Xgeva today.  9.  Hypokalemia: - Potassium today is 4.4.  Continue potassium supplements.  Orders placed this encounter:  No orders of the defined types were placed in this encounter.    Derek Jack, MD [facility] [phone]   I, Thana Ates, am acting as a scribe for Dr. Derek Jack.  I, Derek Jack MD, have reviewed the above documentation for accuracy and completeness, and I agree with the above.

## 2021-02-17 NOTE — Patient Instructions (Signed)
[facility]  Discharge Instructions: Thank you for choosing [facility] to provide your oncology and hematology care.  If you have a lab appointment with the Cancer Center, please come in thru the Main Entrance and check in at the main information desk.  Wear comfortable clothing and clothing appropriate for easy access to any Portacath or PICC line.   We strive to give you quality time with your provider. You may need to reschedule your appointment if you arrive late (15 or more minutes).  Arriving late affects you and other patients whose appointments are after yours.  Also, if you miss three or more appointments without notifying the office, you may be dismissed from the clinic at the provider's discretion.      For prescription refill requests, have your pharmacy contact our office and allow 72 hours for refills to be completed.        To help prevent nausea and vomiting after your treatment, we encourage you to take your nausea medication as directed.  BELOW ARE SYMPTOMS THAT SHOULD BE REPORTED IMMEDIATELY: *FEVER GREATER THAN 100.4 F (38 C) OR HIGHER *CHILLS OR SWEATING *NAUSEA AND VOMITING THAT IS NOT CONTROLLED WITH YOUR NAUSEA MEDICATION *UNUSUAL SHORTNESS OF BREATH *UNUSUAL BRUISING OR BLEEDING *URINARY PROBLEMS (pain or burning when urinating, or frequent urination) *BOWEL PROBLEMS (unusual diarrhea, constipation, pain near the anus) TENDERNESS IN MOUTH AND THROAT WITH OR WITHOUT PRESENCE OF ULCERS (sore throat, sores in mouth, or a toothache) UNUSUAL RASH, SWELLING OR PAIN  UNUSUAL VAGINAL DISCHARGE OR ITCHING   Items with * indicate a potential emergency and should be followed up as soon as possible or go to the Emergency Department if any problems should occur.  Please show the CHEMOTHERAPY ALERT CARD or IMMUNOTHERAPY ALERT CARD at check-in to the Emergency Department and triage nurse.  Should you have questions after your visit or need to cancel  or reschedule your appointment, please contact [facility] [phone]  and follow the prompts.  Office hours are 8:00 a.m. to 4:30 p.m. Monday - Friday. Please note that voicemails left after 4:00 p.m. may not be returned until the following business day.  We are closed weekends and major holidays. You have access to a nurse at all times for urgent questions. Please call the main number to the clinic [phone] and follow the prompts.  For any non-urgent questions, you may also contact your provider using MyChart. We now offer e-Visits for anyone 18 and older to request care online for non-urgent symptoms. For details visit [email or web address].   Also download the MyChart app! Go to the app store, search "MyChart", open the app, select [facility], and log in with your MyChart username and password.  Due to Covid, a mask is required upon entering the hospital/clinic. If you do not have a mask, one will be given to you upon arrival. For doctor visits, patients may have 1 support person aged 18 or older with them. For treatment visits, patients cannot have anyone with them due to current Covid guidelines and our immunocompromised population.  

## 2021-02-17 NOTE — Progress Notes (Signed)
Kim Mills with [facility] notified of need for wound care services for decubitus to buttocks and will contact them to arrange visit.

## 2021-02-18 NOTE — Addendum Note (Signed)
Addended by: Renda Rolls A on: 02/18/2021 08:45 AM   Modules accepted: Orders

## 2021-02-19 ENCOUNTER — Encounter (HOSPITAL_COMMUNITY): Payer: Self-pay

## 2021-02-19 DIAGNOSIS — Z79891 Long term (current) use of opiate analgesic: Secondary | ICD-10-CM | POA: Diagnosis not present

## 2021-02-19 DIAGNOSIS — C9000 Multiple myeloma not having achieved remission: Secondary | ICD-10-CM | POA: Diagnosis not present

## 2021-02-19 DIAGNOSIS — Z7984 Long term (current) use of oral hypoglycemic drugs: Secondary | ICD-10-CM | POA: Diagnosis not present

## 2021-02-19 DIAGNOSIS — E876 Hypokalemia: Secondary | ICD-10-CM | POA: Diagnosis not present

## 2021-02-19 DIAGNOSIS — E039 Hypothyroidism, unspecified: Secondary | ICD-10-CM | POA: Diagnosis not present

## 2021-02-19 DIAGNOSIS — N183 Chronic kidney disease, stage 3 unspecified: Secondary | ICD-10-CM | POA: Diagnosis not present

## 2021-02-19 DIAGNOSIS — H409 Unspecified glaucoma: Secondary | ICD-10-CM | POA: Diagnosis not present

## 2021-02-19 DIAGNOSIS — E785 Hyperlipidemia, unspecified: Secondary | ICD-10-CM | POA: Diagnosis not present

## 2021-02-19 DIAGNOSIS — R634 Abnormal weight loss: Secondary | ICD-10-CM | POA: Diagnosis not present

## 2021-02-19 DIAGNOSIS — M1990 Unspecified osteoarthritis, unspecified site: Secondary | ICD-10-CM | POA: Diagnosis not present

## 2021-02-19 DIAGNOSIS — E1122 Type 2 diabetes mellitus with diabetic chronic kidney disease: Secondary | ICD-10-CM | POA: Diagnosis not present

## 2021-02-19 DIAGNOSIS — R197 Diarrhea, unspecified: Secondary | ICD-10-CM | POA: Diagnosis not present

## 2021-02-19 DIAGNOSIS — Z9181 History of falling: Secondary | ICD-10-CM | POA: Diagnosis not present

## 2021-02-19 DIAGNOSIS — E669 Obesity, unspecified: Secondary | ICD-10-CM | POA: Diagnosis not present

## 2021-02-19 DIAGNOSIS — L89311 Pressure ulcer of right buttock, stage 1: Secondary | ICD-10-CM | POA: Diagnosis not present

## 2021-02-19 DIAGNOSIS — N3281 Overactive bladder: Secondary | ICD-10-CM | POA: Diagnosis not present

## 2021-02-19 DIAGNOSIS — I129 Hypertensive chronic kidney disease with stage 1 through stage 4 chronic kidney disease, or unspecified chronic kidney disease: Secondary | ICD-10-CM | POA: Diagnosis not present

## 2021-02-19 DIAGNOSIS — K219 Gastro-esophageal reflux disease without esophagitis: Secondary | ICD-10-CM | POA: Diagnosis not present

## 2021-02-19 DIAGNOSIS — Z6827 Body mass index (BMI) 27.0-27.9, adult: Secondary | ICD-10-CM | POA: Diagnosis not present

## 2021-02-19 DIAGNOSIS — I2510 Atherosclerotic heart disease of native coronary artery without angina pectoris: Secondary | ICD-10-CM | POA: Diagnosis not present

## 2021-02-19 DIAGNOSIS — Z7982 Long term (current) use of aspirin: Secondary | ICD-10-CM | POA: Diagnosis not present

## 2021-02-19 DIAGNOSIS — D649 Anemia, unspecified: Secondary | ICD-10-CM | POA: Diagnosis not present

## 2021-02-19 NOTE — Progress Notes (Signed)
Verbal order confirmation given to Steffanie Dunn, RN with [facility] for RN weekly x7 weeks, nurse aide twice weekly x4weeks, and PT and OT evaluation per Dr. Delton Coombes

## 2021-02-23 DIAGNOSIS — I129 Hypertensive chronic kidney disease with stage 1 through stage 4 chronic kidney disease, or unspecified chronic kidney disease: Secondary | ICD-10-CM | POA: Diagnosis not present

## 2021-02-23 DIAGNOSIS — E1122 Type 2 diabetes mellitus with diabetic chronic kidney disease: Secondary | ICD-10-CM | POA: Diagnosis not present

## 2021-02-23 DIAGNOSIS — C9000 Multiple myeloma not having achieved remission: Secondary | ICD-10-CM | POA: Diagnosis not present

## 2021-02-23 DIAGNOSIS — N183 Chronic kidney disease, stage 3 unspecified: Secondary | ICD-10-CM | POA: Diagnosis not present

## 2021-02-23 DIAGNOSIS — D649 Anemia, unspecified: Secondary | ICD-10-CM | POA: Diagnosis not present

## 2021-02-23 DIAGNOSIS — L89311 Pressure ulcer of right buttock, stage 1: Secondary | ICD-10-CM | POA: Diagnosis not present

## 2021-02-24 DIAGNOSIS — C9000 Multiple myeloma not having achieved remission: Secondary | ICD-10-CM | POA: Diagnosis not present

## 2021-02-24 DIAGNOSIS — D649 Anemia, unspecified: Secondary | ICD-10-CM | POA: Diagnosis not present

## 2021-02-24 DIAGNOSIS — L89311 Pressure ulcer of right buttock, stage 1: Secondary | ICD-10-CM | POA: Diagnosis not present

## 2021-02-24 DIAGNOSIS — I129 Hypertensive chronic kidney disease with stage 1 through stage 4 chronic kidney disease, or unspecified chronic kidney disease: Secondary | ICD-10-CM | POA: Diagnosis not present

## 2021-02-24 DIAGNOSIS — N183 Chronic kidney disease, stage 3 unspecified: Secondary | ICD-10-CM | POA: Diagnosis not present

## 2021-02-24 DIAGNOSIS — E1122 Type 2 diabetes mellitus with diabetic chronic kidney disease: Secondary | ICD-10-CM | POA: Diagnosis not present

## 2021-02-25 ENCOUNTER — Telehealth: Payer: Self-pay | Admitting: Internal Medicine

## 2021-02-25 ENCOUNTER — Ambulatory Visit (HOSPITAL_COMMUNITY): Payer: Medicare Other | Admitting: Hematology

## 2021-02-25 ENCOUNTER — Other Ambulatory Visit (HOSPITAL_COMMUNITY): Payer: Medicare Other

## 2021-02-25 ENCOUNTER — Ambulatory Visit (HOSPITAL_COMMUNITY): Payer: Medicare Other

## 2021-02-25 NOTE — Telephone Encounter (Signed)
Pts daughter is calling to say that they need a new order\refill on the gabapentin,   Caller states that Dr Posey Pronto changed it to 2 times a day -but did not call that in --please advise

## 2021-02-26 ENCOUNTER — Other Ambulatory Visit: Payer: Self-pay | Admitting: Internal Medicine

## 2021-02-26 DIAGNOSIS — D649 Anemia, unspecified: Secondary | ICD-10-CM | POA: Diagnosis not present

## 2021-02-26 DIAGNOSIS — L89311 Pressure ulcer of right buttock, stage 1: Secondary | ICD-10-CM | POA: Diagnosis not present

## 2021-02-26 DIAGNOSIS — N183 Chronic kidney disease, stage 3 unspecified: Secondary | ICD-10-CM | POA: Diagnosis not present

## 2021-02-26 DIAGNOSIS — I129 Hypertensive chronic kidney disease with stage 1 through stage 4 chronic kidney disease, or unspecified chronic kidney disease: Secondary | ICD-10-CM | POA: Diagnosis not present

## 2021-02-26 DIAGNOSIS — E1140 Type 2 diabetes mellitus with diabetic neuropathy, unspecified: Secondary | ICD-10-CM

## 2021-02-26 DIAGNOSIS — E1122 Type 2 diabetes mellitus with diabetic chronic kidney disease: Secondary | ICD-10-CM | POA: Diagnosis not present

## 2021-02-26 DIAGNOSIS — C9000 Multiple myeloma not having achieved remission: Secondary | ICD-10-CM | POA: Diagnosis not present

## 2021-02-26 MED ORDER — GABAPENTIN 300 MG PO CAPS: 300.0000 mg | ORAL_CAPSULE | Freq: Two times a day (BID) | ORAL | 3 refills | Status: DC

## 2021-02-26 NOTE — Telephone Encounter (Signed)
noted 

## 2021-02-27 DIAGNOSIS — D649 Anemia, unspecified: Secondary | ICD-10-CM | POA: Diagnosis not present

## 2021-02-27 DIAGNOSIS — N183 Chronic kidney disease, stage 3 unspecified: Secondary | ICD-10-CM | POA: Diagnosis not present

## 2021-02-27 DIAGNOSIS — C9000 Multiple myeloma not having achieved remission: Secondary | ICD-10-CM | POA: Diagnosis not present

## 2021-02-27 DIAGNOSIS — E1122 Type 2 diabetes mellitus with diabetic chronic kidney disease: Secondary | ICD-10-CM | POA: Diagnosis not present

## 2021-02-27 DIAGNOSIS — I129 Hypertensive chronic kidney disease with stage 1 through stage 4 chronic kidney disease, or unspecified chronic kidney disease: Secondary | ICD-10-CM | POA: Diagnosis not present

## 2021-02-27 DIAGNOSIS — L89311 Pressure ulcer of right buttock, stage 1: Secondary | ICD-10-CM | POA: Diagnosis not present

## 2021-02-28 DIAGNOSIS — L89311 Pressure ulcer of right buttock, stage 1: Secondary | ICD-10-CM | POA: Diagnosis not present

## 2021-02-28 DIAGNOSIS — D649 Anemia, unspecified: Secondary | ICD-10-CM | POA: Diagnosis not present

## 2021-02-28 DIAGNOSIS — C9000 Multiple myeloma not having achieved remission: Secondary | ICD-10-CM | POA: Diagnosis not present

## 2021-02-28 DIAGNOSIS — I129 Hypertensive chronic kidney disease with stage 1 through stage 4 chronic kidney disease, or unspecified chronic kidney disease: Secondary | ICD-10-CM | POA: Diagnosis not present

## 2021-02-28 DIAGNOSIS — N183 Chronic kidney disease, stage 3 unspecified: Secondary | ICD-10-CM | POA: Diagnosis not present

## 2021-02-28 DIAGNOSIS — E1122 Type 2 diabetes mellitus with diabetic chronic kidney disease: Secondary | ICD-10-CM | POA: Diagnosis not present

## 2021-03-02 DIAGNOSIS — C9000 Multiple myeloma not having achieved remission: Secondary | ICD-10-CM | POA: Diagnosis not present

## 2021-03-02 DIAGNOSIS — N183 Chronic kidney disease, stage 3 unspecified: Secondary | ICD-10-CM | POA: Diagnosis not present

## 2021-03-02 DIAGNOSIS — L89311 Pressure ulcer of right buttock, stage 1: Secondary | ICD-10-CM | POA: Diagnosis not present

## 2021-03-02 DIAGNOSIS — I129 Hypertensive chronic kidney disease with stage 1 through stage 4 chronic kidney disease, or unspecified chronic kidney disease: Secondary | ICD-10-CM | POA: Diagnosis not present

## 2021-03-02 DIAGNOSIS — D649 Anemia, unspecified: Secondary | ICD-10-CM | POA: Diagnosis not present

## 2021-03-02 DIAGNOSIS — E1122 Type 2 diabetes mellitus with diabetic chronic kidney disease: Secondary | ICD-10-CM | POA: Diagnosis not present

## 2021-03-03 DIAGNOSIS — E1122 Type 2 diabetes mellitus with diabetic chronic kidney disease: Secondary | ICD-10-CM | POA: Diagnosis not present

## 2021-03-03 DIAGNOSIS — I129 Hypertensive chronic kidney disease with stage 1 through stage 4 chronic kidney disease, or unspecified chronic kidney disease: Secondary | ICD-10-CM | POA: Diagnosis not present

## 2021-03-03 DIAGNOSIS — N183 Chronic kidney disease, stage 3 unspecified: Secondary | ICD-10-CM | POA: Diagnosis not present

## 2021-03-03 DIAGNOSIS — L89311 Pressure ulcer of right buttock, stage 1: Secondary | ICD-10-CM | POA: Diagnosis not present

## 2021-03-03 DIAGNOSIS — D649 Anemia, unspecified: Secondary | ICD-10-CM | POA: Diagnosis not present

## 2021-03-03 DIAGNOSIS — C9000 Multiple myeloma not having achieved remission: Secondary | ICD-10-CM | POA: Diagnosis not present

## 2021-03-04 DIAGNOSIS — L89311 Pressure ulcer of right buttock, stage 1: Secondary | ICD-10-CM | POA: Diagnosis not present

## 2021-03-04 DIAGNOSIS — N183 Chronic kidney disease, stage 3 unspecified: Secondary | ICD-10-CM | POA: Diagnosis not present

## 2021-03-04 DIAGNOSIS — E1122 Type 2 diabetes mellitus with diabetic chronic kidney disease: Secondary | ICD-10-CM | POA: Diagnosis not present

## 2021-03-04 DIAGNOSIS — D649 Anemia, unspecified: Secondary | ICD-10-CM | POA: Diagnosis not present

## 2021-03-04 DIAGNOSIS — I129 Hypertensive chronic kidney disease with stage 1 through stage 4 chronic kidney disease, or unspecified chronic kidney disease: Secondary | ICD-10-CM | POA: Diagnosis not present

## 2021-03-04 DIAGNOSIS — C9000 Multiple myeloma not having achieved remission: Secondary | ICD-10-CM | POA: Diagnosis not present

## 2021-03-05 DIAGNOSIS — D649 Anemia, unspecified: Secondary | ICD-10-CM | POA: Diagnosis not present

## 2021-03-05 DIAGNOSIS — E1122 Type 2 diabetes mellitus with diabetic chronic kidney disease: Secondary | ICD-10-CM | POA: Diagnosis not present

## 2021-03-05 DIAGNOSIS — L89311 Pressure ulcer of right buttock, stage 1: Secondary | ICD-10-CM | POA: Diagnosis not present

## 2021-03-05 DIAGNOSIS — I129 Hypertensive chronic kidney disease with stage 1 through stage 4 chronic kidney disease, or unspecified chronic kidney disease: Secondary | ICD-10-CM | POA: Diagnosis not present

## 2021-03-05 DIAGNOSIS — C9000 Multiple myeloma not having achieved remission: Secondary | ICD-10-CM | POA: Diagnosis not present

## 2021-03-05 DIAGNOSIS — N183 Chronic kidney disease, stage 3 unspecified: Secondary | ICD-10-CM | POA: Diagnosis not present

## 2021-03-06 DIAGNOSIS — L89311 Pressure ulcer of right buttock, stage 1: Secondary | ICD-10-CM | POA: Diagnosis not present

## 2021-03-06 DIAGNOSIS — N183 Chronic kidney disease, stage 3 unspecified: Secondary | ICD-10-CM | POA: Diagnosis not present

## 2021-03-06 DIAGNOSIS — D649 Anemia, unspecified: Secondary | ICD-10-CM | POA: Diagnosis not present

## 2021-03-06 DIAGNOSIS — I129 Hypertensive chronic kidney disease with stage 1 through stage 4 chronic kidney disease, or unspecified chronic kidney disease: Secondary | ICD-10-CM | POA: Diagnosis not present

## 2021-03-06 DIAGNOSIS — C9000 Multiple myeloma not having achieved remission: Secondary | ICD-10-CM | POA: Diagnosis not present

## 2021-03-06 DIAGNOSIS — E1122 Type 2 diabetes mellitus with diabetic chronic kidney disease: Secondary | ICD-10-CM | POA: Diagnosis not present

## 2021-03-09 DIAGNOSIS — E1122 Type 2 diabetes mellitus with diabetic chronic kidney disease: Secondary | ICD-10-CM | POA: Diagnosis not present

## 2021-03-09 DIAGNOSIS — D649 Anemia, unspecified: Secondary | ICD-10-CM | POA: Diagnosis not present

## 2021-03-09 DIAGNOSIS — L89311 Pressure ulcer of right buttock, stage 1: Secondary | ICD-10-CM | POA: Diagnosis not present

## 2021-03-09 DIAGNOSIS — I129 Hypertensive chronic kidney disease with stage 1 through stage 4 chronic kidney disease, or unspecified chronic kidney disease: Secondary | ICD-10-CM | POA: Diagnosis not present

## 2021-03-09 DIAGNOSIS — C9000 Multiple myeloma not having achieved remission: Secondary | ICD-10-CM | POA: Diagnosis not present

## 2021-03-09 DIAGNOSIS — N183 Chronic kidney disease, stage 3 unspecified: Secondary | ICD-10-CM | POA: Diagnosis not present

## 2021-03-10 DIAGNOSIS — N183 Chronic kidney disease, stage 3 unspecified: Secondary | ICD-10-CM | POA: Diagnosis not present

## 2021-03-10 DIAGNOSIS — I129 Hypertensive chronic kidney disease with stage 1 through stage 4 chronic kidney disease, or unspecified chronic kidney disease: Secondary | ICD-10-CM | POA: Diagnosis not present

## 2021-03-10 DIAGNOSIS — E1122 Type 2 diabetes mellitus with diabetic chronic kidney disease: Secondary | ICD-10-CM | POA: Diagnosis not present

## 2021-03-10 DIAGNOSIS — D649 Anemia, unspecified: Secondary | ICD-10-CM | POA: Diagnosis not present

## 2021-03-10 DIAGNOSIS — C9000 Multiple myeloma not having achieved remission: Secondary | ICD-10-CM | POA: Diagnosis not present

## 2021-03-10 DIAGNOSIS — L89311 Pressure ulcer of right buttock, stage 1: Secondary | ICD-10-CM | POA: Diagnosis not present

## 2021-03-11 DIAGNOSIS — E1122 Type 2 diabetes mellitus with diabetic chronic kidney disease: Secondary | ICD-10-CM | POA: Diagnosis not present

## 2021-03-11 DIAGNOSIS — L89311 Pressure ulcer of right buttock, stage 1: Secondary | ICD-10-CM | POA: Diagnosis not present

## 2021-03-11 DIAGNOSIS — N183 Chronic kidney disease, stage 3 unspecified: Secondary | ICD-10-CM | POA: Diagnosis not present

## 2021-03-11 DIAGNOSIS — C9000 Multiple myeloma not having achieved remission: Secondary | ICD-10-CM | POA: Diagnosis not present

## 2021-03-11 DIAGNOSIS — D649 Anemia, unspecified: Secondary | ICD-10-CM | POA: Diagnosis not present

## 2021-03-11 DIAGNOSIS — I129 Hypertensive chronic kidney disease with stage 1 through stage 4 chronic kidney disease, or unspecified chronic kidney disease: Secondary | ICD-10-CM | POA: Diagnosis not present

## 2021-03-12 DIAGNOSIS — L89311 Pressure ulcer of right buttock, stage 1: Secondary | ICD-10-CM | POA: Diagnosis not present

## 2021-03-12 DIAGNOSIS — E1122 Type 2 diabetes mellitus with diabetic chronic kidney disease: Secondary | ICD-10-CM | POA: Diagnosis not present

## 2021-03-12 DIAGNOSIS — N183 Chronic kidney disease, stage 3 unspecified: Secondary | ICD-10-CM | POA: Diagnosis not present

## 2021-03-12 DIAGNOSIS — I129 Hypertensive chronic kidney disease with stage 1 through stage 4 chronic kidney disease, or unspecified chronic kidney disease: Secondary | ICD-10-CM | POA: Diagnosis not present

## 2021-03-12 DIAGNOSIS — D649 Anemia, unspecified: Secondary | ICD-10-CM | POA: Diagnosis not present

## 2021-03-12 DIAGNOSIS — C9000 Multiple myeloma not having achieved remission: Secondary | ICD-10-CM | POA: Diagnosis not present

## 2021-03-13 DIAGNOSIS — L89311 Pressure ulcer of right buttock, stage 1: Secondary | ICD-10-CM | POA: Diagnosis not present

## 2021-03-13 DIAGNOSIS — C9000 Multiple myeloma not having achieved remission: Secondary | ICD-10-CM | POA: Diagnosis not present

## 2021-03-13 DIAGNOSIS — N183 Chronic kidney disease, stage 3 unspecified: Secondary | ICD-10-CM | POA: Diagnosis not present

## 2021-03-13 DIAGNOSIS — E1122 Type 2 diabetes mellitus with diabetic chronic kidney disease: Secondary | ICD-10-CM | POA: Diagnosis not present

## 2021-03-13 DIAGNOSIS — I129 Hypertensive chronic kidney disease with stage 1 through stage 4 chronic kidney disease, or unspecified chronic kidney disease: Secondary | ICD-10-CM | POA: Diagnosis not present

## 2021-03-13 DIAGNOSIS — D649 Anemia, unspecified: Secondary | ICD-10-CM | POA: Diagnosis not present

## 2021-03-16 DIAGNOSIS — L89311 Pressure ulcer of right buttock, stage 1: Secondary | ICD-10-CM | POA: Diagnosis not present

## 2021-03-16 DIAGNOSIS — C9000 Multiple myeloma not having achieved remission: Secondary | ICD-10-CM | POA: Diagnosis not present

## 2021-03-16 DIAGNOSIS — E1122 Type 2 diabetes mellitus with diabetic chronic kidney disease: Secondary | ICD-10-CM | POA: Diagnosis not present

## 2021-03-16 DIAGNOSIS — I129 Hypertensive chronic kidney disease with stage 1 through stage 4 chronic kidney disease, or unspecified chronic kidney disease: Secondary | ICD-10-CM | POA: Diagnosis not present

## 2021-03-16 DIAGNOSIS — N183 Chronic kidney disease, stage 3 unspecified: Secondary | ICD-10-CM | POA: Diagnosis not present

## 2021-03-16 DIAGNOSIS — D649 Anemia, unspecified: Secondary | ICD-10-CM | POA: Diagnosis not present

## 2021-03-17 ENCOUNTER — Other Ambulatory Visit: Payer: Self-pay | Admitting: Internal Medicine

## 2021-03-17 ENCOUNTER — Other Ambulatory Visit (HOSPITAL_COMMUNITY): Payer: Medicare Other

## 2021-03-17 ENCOUNTER — Inpatient Hospital Stay (HOSPITAL_COMMUNITY): Payer: Medicare Other | Attending: Hematology

## 2021-03-17 ENCOUNTER — Inpatient Hospital Stay (HOSPITAL_COMMUNITY): Payer: Medicare Other

## 2021-03-17 ENCOUNTER — Inpatient Hospital Stay (HOSPITAL_BASED_OUTPATIENT_CLINIC_OR_DEPARTMENT_OTHER): Payer: Medicare Other | Admitting: Hematology

## 2021-03-17 ENCOUNTER — Other Ambulatory Visit: Payer: Self-pay

## 2021-03-17 VITALS — BP 119/75 | HR 119 | Temp 98.1°F | Resp 16

## 2021-03-17 DIAGNOSIS — C9000 Multiple myeloma not having achieved remission: Secondary | ICD-10-CM | POA: Insufficient documentation

## 2021-03-17 DIAGNOSIS — D649 Anemia, unspecified: Secondary | ICD-10-CM

## 2021-03-17 LAB — COMPREHENSIVE METABOLIC PANEL
ALT: 29 U/L (ref 0–44)
AST: 23 U/L (ref 15–41)
Albumin: 3.9 g/dL (ref 3.5–5.0)
Alkaline Phosphatase: 54 U/L (ref 38–126)
Anion gap: 10 (ref 5–15)
BUN: 32 mg/dL — ABNORMAL HIGH (ref 8–23)
CO2: 24 mmol/L (ref 22–32)
Calcium: 8.9 mg/dL (ref 8.9–10.3)
Chloride: 106 mmol/L (ref 98–111)
Creatinine, Ser: 1.2 mg/dL — ABNORMAL HIGH (ref 0.44–1.00)
GFR, Estimated: 46 mL/min — ABNORMAL LOW (ref >60)
Glucose, Bld: 83 mg/dL (ref 70–99)
Potassium: 3.9 mmol/L (ref 3.5–5.1)
Sodium: 140 mmol/L (ref 135–145)
Total Bilirubin: 0.5 mg/dL (ref 0.3–1.2)
Total Protein: 7.2 g/dL (ref 6.5–8.1)

## 2021-03-17 LAB — CBC WITH DIFFERENTIAL/PLATELET
Abs Immature Granulocytes: 0.05 10*3/uL (ref 0.00–0.07)
Basophils Absolute: 0.1 10*3/uL (ref 0.0–0.1)
Basophils Relative: 1 %
Eosinophils Absolute: 0.2 10*3/uL (ref 0.0–0.5)
Eosinophils Relative: 3 %
HCT: 38.6 % (ref 36.0–46.0)
Hemoglobin: 12.1 g/dL (ref 12.0–15.0)
Immature Granulocytes: 1 %
Lymphocytes Relative: 46 %
Lymphs Abs: 3.5 10*3/uL (ref 0.7–4.0)
MCH: 29.2 pg (ref 26.0–34.0)
MCHC: 31.3 g/dL (ref 30.0–36.0)
MCV: 93.2 fL (ref 80.0–100.0)
Monocytes Absolute: 0.7 10*3/uL (ref 0.1–1.0)
Monocytes Relative: 9 %
Neutro Abs: 3 10*3/uL (ref 1.7–7.7)
Neutrophils Relative %: 40 %
Platelets: 197 10*3/uL (ref 150–400)
RBC: 4.14 MIL/uL (ref 3.87–5.11)
RDW: 16.6 % — ABNORMAL HIGH (ref 11.5–15.5)
WBC: 7.5 10*3/uL (ref 4.0–10.5)
nRBC: 0 % (ref 0.0–0.2)

## 2021-03-17 LAB — MAGNESIUM: Magnesium: 1.7 mg/dL (ref 1.7–2.4)

## 2021-03-17 LAB — LACTATE DEHYDROGENASE: LDH: 135 U/L (ref 98–192)

## 2021-03-17 MED ORDER — DENOSUMAB 120 MG/1.7ML ~~LOC~~ SOLN
120.0000 mg | Freq: Once | SUBCUTANEOUS | Status: AC
  Administered 2021-03-17: 120 mg via SUBCUTANEOUS
  Filled 2021-03-17: qty 1.7

## 2021-03-17 NOTE — Progress Notes (Signed)
Patient presents today for Xgeva injection.  Patient is in satisfactory condition with no new complaints voiced.  Vital signs are stable.  Labs reviewed by Dr. Delton Coombes during her office visit.  All labs are within treatment parameters.  We will proceed with treatment per MD orders.

## 2021-03-17 NOTE — Progress Notes (Signed)
[place], [place] [address]   CLINIC:  Medical Oncology/Hematology  PCP:  Lindell Spar, MD [address] / [place] [address] [phone]   REASON FOR VISIT:  Follow-up for multiple myeloma & normocytic anemia  PRIOR THERAPY:  Velcade x 9 cycles from 10/29/2019 to 05/02/2020 Revlimid 2/3 weeks  CURRENT THERAPY: surveillance  BRIEF ONCOLOGIC HISTORY:  Oncology History  Multiple myeloma not having achieved remission ([facility])  10/13/2019 Initial Diagnosis   Multiple myeloma not having achieved remission ([facility])   10/29/2019 -  Chemotherapy   Patient is on Treatment Plan : MYELOMA NON-TRANSPLANT CANDIDATES VRd weekly q21d       CANCER STAGING:  Cancer Staging  No matching staging information was found for the patient.  INTERVAL HISTORY:  Ms. ADONNA HORSLEY, a 81 y.o. female, returns for routine follow-up of her multiple myeloma & normocytic anemia. Arwen was last seen on 02/17/2021.   Today she reports feeling well. Her pain and fatigue have improved. She is taking Tramadol every 4 hours, 6 tablets daily, which is helping with her generalized body pains. She denies diarrhea. Her activity levels have increased with physical therapy.   REVIEW OF SYSTEMS:  Review of Systems  Constitutional:  Negative for appetite change and fatigue (improved).  Gastrointestinal:  Negative for diarrhea.  Genitourinary:  Positive for frequency.   Musculoskeletal:  Negative for arthralgias (improved).  All other systems reviewed and are negative.  PAST MEDICAL/SURGICAL HISTORY:  Past Medical History:  Diagnosis Date   Arthritis    Cancer [facility])    Cataract    Chest pain    Associated with weakness and fatigue   Diabetes mellitus    A1c of 7.4 in 08/2010   GERD (gastroesophageal reflux disease)    Glaucoma    Hyperlipidemia    Lipid profile in 08/2010:190, 121, 48, 118; normal CBC and CMet   Hypertension    Lipid profile in 08/2010:190, 121,  48, 118; normal CBC and CMet   Hypothyroidism    Hypothyroidism    Obesity    Past Surgical History:  Procedure Laterality Date   CHOLECYSTECTOMY     COLONOSCOPY N/A 04/01/2014   RMR: Melanosis coli. colonic polyps removed as described above.    CYSTOSCOPY W/ URETERAL STENT PLACEMENT Right 09/13/2019   Procedure: CYSTOSCOPY WITH RIGHT  RETROGRADE PYELOGRAM; BLADDER BIOPSY;  Surgeon: Cleon Gustin, MD;  Location: [facility];  Service: Urology;  Laterality: Right;   LEFT HEART CATH AND CORONARY ANGIOGRAPHY N/A 10/07/2017   Procedure: LEFT HEART CATH AND CORONARY ANGIOGRAPHY;  Surgeon: Troy Sine, MD;  Location: [facility];  Service: Cardiovascular;  Laterality: N/A;   THYROIDECTOMY, PARTIAL      SOCIAL HISTORY:  Social History   Socioeconomic History   Marital status: Widowed    Spouse name: Not on file   Number of children: 7   Years of education: Not on file   Highest education level: Not on file  Occupational History    Employer: RETIRED  Tobacco Use   Smoking status: Never    Passive exposure: Yes   Smokeless tobacco: Never  Vaping Use   Vaping Use: Never used  Substance and Sexual Activity   Alcohol use: Never   Drug use: Never   Sexual activity: Not Currently  Other Topics Concern   Not on file  Social History Narrative   Daughter, Corky Sing, lives with and assists patient.    Social Determinants  of Health   Financial Resource Strain: Low Risk    Difficulty of Paying Living Expenses: Not hard at all  Food Insecurity: No Food Insecurity   Worried About [place] in the Last Year: Never true   [place] in the Last Year: Never true  Transportation Needs: No Transportation Needs   Lack of Transportation (Medical): No   Lack of Transportation (Non-Medical): No  Physical Activity: Sufficiently Active   Days of Exercise per Week: 5 days   Minutes of Exercise per Session: 30 min  Stress: No Stress Concern Present   Feeling of Stress : Not at  all  Social Connections: Moderately Integrated   Frequency of Communication with Friends and Family: More than three times a week   Frequency of Social Gatherings with Friends and Family: More than three times a week   Attends Religious Services: 1 to 4 times per year   Active Member of [employer] or Organizations: Yes   Attends Archivist Meetings: 1 to 4 times per year   Marital Status: Widowed  Human resources officer Violence: Not At Risk   Fear of Current or Ex-Partner: No   Emotionally Abused: No   Physically Abused: No   Sexually Abused: No    FAMILY HISTORY:  Family History  Problem Relation Age of Onset   Anemia Father    Arthritis Mother    Stroke Brother    Cancer Sister        unknown kind   Diabetes Sister    Dementia Sister    Cancer Niece        Breast   Cancer Brother    Healthy Son    Healthy Son    Healthy Son    Healthy Daughter    Healthy Daughter    Healthy Daughter    Sarcoidosis Daughter     CURRENT MEDICATIONS:  Current Outpatient Medications  Medication Sig Dispense Refill   acyclovir (ZOVIRAX) 400 MG tablet TAKE ONE TABLET (400MG TOTAL) BY MOUTH TWICE DAILY 60 tablet 6   albuterol (VENTOLIN HFA) 108 (90 Base) MCG/ACT inhaler Inhale 2 puffs into the lungs every 6 (six) hours as needed for wheezing or shortness of breath. 8 g 0   ASPIRIN 81 PO Take 1 tablet by mouth daily.     blood glucose meter kit and supplies Dispense based on patient and insurance preference. Use up to four times daily as directed. (FOR ICD-10 E10.9, E11.9). 1 each 0   Blood Glucose Monitoring Suppl (BLOOD GLUCOSE SYSTEM PAK) KIT Please dispense based on patient and insurance preference. Use as directed to monitor FSBS 2x daily. Dx: E11.9. 1 each 1   Calcium 500 MG tablet Take 600 mg by mouth 2 (two) times daily.     fluticasone (FLONASE) 50 MCG/ACT nasal spray USE TWO SPRAYS IN BOTH NOSTRILS DAILY (Patient taking differently: Place 2 sprays into both nostrils daily.) 16 g 6    gabapentin (NEURONTIN) 300 MG capsule Take 1 capsule (300 mg total) by mouth 2 (two) times daily. Dose/frequency change 180 capsule 3   glipiZIDE (GLUCOTROL) 5 MG tablet TAKE ONE TABLET (5MG TOTAL) BY MOUTH TWOTIMES DAILY (Patient taking differently: Take 5 mg by mouth 2 (two) times daily before a meal.) 180 tablet 3   Glucose Blood (BLOOD GLUCOSE TEST STRIPS) STRP Please dispense based on patient and insurance preference. Use as directed to monitor FSBS 2x daily. Dx: E11.9. 100 each 11   guaiFENesin-dextromethorphan (ROBITUSSIN DM) 100-10 MG/5ML  syrup Take 5 mLs by mouth every 4 (four) hours as needed for cough. 118 mL 0   HM LORATADINE 10 MG tablet TAKE ONE TABLET (10MG TOTAL) BY MOUTH DAILY (Patient taking differently: Take 10 mg by mouth daily as needed for allergies.) 30 tablet 0   Lancets MISC Please dispense based on patient and insurance preference. Use as directed to monitor FSBS 2x daily. Dx: E11.9. 100 each 11   magic mouthwash w/lidocaine SOLN Take 5 mLs by mouth 4 (four) times daily. 450 mL 0   magnesium oxide (MAG-OX) 400 (240 Mg) MG tablet TAKE ONE TABLET (400MG TOTAL) BY MOUTH TWO TIMES DAILY (Patient taking differently: Take 400 mg by mouth 2 (two) times daily.) 120 tablet 1   meclizine (ANTIVERT) 25 MG tablet Take 25 mg by mouth every 6 (six) hours as needed for dizziness.     metFORMIN (GLUCOPHAGE) 1000 MG tablet TAKE ONE TABLET BY MOUTH TWICE A DAY 60 tablet 3   metoprolol tartrate (LOPRESSOR) 25 MG tablet TAKE ONE-HALF TABLET (12.5MG TOTAL) BY MOUTH TWO TIMES DAILY (Patient taking differently: Take 12.5 mg by mouth 2 (two) times daily.) 90 tablet 3   omeprazole (PRILOSEC) 40 MG capsule TAKE ONE CAPSULE BY MOUTH DAILY 90 capsule 3   oxyCODONE 10 MG TABS Take 1 tablet (10 mg total) by mouth every 6 (six) hours as needed for severe pain. 60 tablet 0   polyethylene glycol (MIRALAX / GLYCOLAX) 17 g packet Take 17 g by mouth daily. 14 each 0   potassium chloride (KLOR-CON) 10 MEQ tablet  Take 2 tablets (20 mEq total) by mouth daily. 60 tablet 2   simvastatin (ZOCOR) 40 MG tablet TAKE ONE TABLET (40MG TOTAL) BY MOUTH BEDTIME (Patient taking differently: Take 40 mg by mouth at bedtime.) 90 tablet 3   SYNTHROID 137 MCG tablet TAKE ONE TABLET (137MCG TOTAL) BY MOUTH DAILY BEFORE BREAKFAST 90 tablet 1   traMADol (ULTRAM) 50 MG tablet Take 1 tablet (50 mg total) by mouth every 6 (six) hours as needed. 120 tablet 0   zinc oxide (MEIJER ZINC OXIDE) 20 % ointment Apply 1 application topically as needed for irritation. 56.7 g 0   lenalidomide (REVLIMID) 10 MG capsule Take 1 capsule (10 mg total) by mouth daily. 14 days on, 7 days off (Patient not taking: Reported on 03/17/2021) 14 capsule 0   No current facility-administered medications for this visit.    ALLERGIES:  No Known Allergies  PHYSICAL EXAM:  Performance status (ECOG): 3 - Symptomatic, >50% confined to bed  Vitals:   03/17/21 1410  BP: 119/75  Pulse: (!) 119  Resp: 16  Temp: 98.1 F (36.7 C)  SpO2: 98%   Wt Readings from Last 3 Encounters:  02/17/21 155 lb (70.3 kg)  11/11/20 160 lb 9.6 oz (72.8 kg)  11/05/20 156 lb (70.8 kg)   Physical Exam Vitals reviewed.  Constitutional:      Appearance: Normal appearance.     Comments: In wheelchair  Cardiovascular:     Rate and Rhythm: Normal rate and regular rhythm.     Pulses: Normal pulses.     Heart sounds: Normal heart sounds.  Pulmonary:     Effort: Pulmonary effort is normal.     Breath sounds: Normal breath sounds.  Musculoskeletal:     Right lower leg: No edema.     Left lower leg: No edema.  Neurological:     General: No focal deficit present.     Mental Status: She is  alert and oriented to person, place, and time.  Psychiatric:        Mood and Affect: Mood normal.        Behavior: Behavior normal.     LABORATORY DATA:  I have reviewed the labs as listed.  CBC Latest Ref Rng & Units 03/17/2021 02/12/2021 02/04/2021  WBC 4.0 - 10.5 K/uL 7.5 7.2 6.5   Hemoglobin 12.0 - 15.0 g/dL 12.1 12.3 11.7(L)  Hematocrit 36.0 - 46.0 % 38.6 40.0 37.4  Platelets 150 - 400 K/uL 197 272 198   CMP Latest Ref Rng & Units 03/17/2021 02/12/2021 02/04/2021  Glucose 70 - 99 mg/dL 83 139(H) 91  BUN 8 - 23 mg/dL 32(H) 16 18  Creatinine 0.44 - 1.00 mg/dL 1.20(H) 1.01(H) 0.82  Sodium 135 - 145 mmol/L 140 141 143  Potassium 3.5 - 5.1 mmol/L 3.9 4.4 4.2  Chloride 98 - 111 mmol/L 106 109 110  CO2 22 - 32 mmol/L _0 Calcium 8.9 - 10.3 mg/dL 8.9 8.7(L) 9.0  Total Protein 6.5 - 8.1 g/dL 7.2 6.8 6.9  Total Bilirubin 0.3 - 1.2 mg/dL 0.5 0.6 0.9  Alkaline Phos 38 - 126 U/L 54 78 75  AST 15 - 41 U/L _1 ALT 0 - 44 U/L 29 27 45(H)    DIAGNOSTIC IMAGING:  I have independently reviewed the scans and discussed with the patient. No results found.   ASSESSMENT:  1.  Normocytic anemia: -Patient seen at the request of Dr. Buelah Manis for further work-up and management of normocytic anemia. -Recent CBC on 08/21/2019 shows hemoglobin 6.9 with MCV of 88.3.  White count and platelets are normal.  No history of CKD.  Denies any bleeding per rectum or melena. Last colonoscopy on 04/01/2014 shows diffusely pigmented rectal mucosa consistent with melanosis [name]  Diffusely pigmented colonic mucosa, 2 diminutive polyps in the mid ascending segment, otherwise the remainder of the colon mucosa was normal.  Biopsy consistent with tubular adenoma. -CT renal study on 05/11/2019 shows normal-sized spleen with normal liver.  No other abnormal adenopathy. -Denies any prior history of blood transfusion.  She is currently taking iron tablet twice daily.   2.  IgG lambda plasma cell myeloma: -Skeletal survey on 09/12/2019 shows diffuse faint lucencies in the skull, bilateral femurs. -Bone marrow biopsy on 09/27/2019 shows 70% plasma cells in hypercellular marrow.  Chromosome analysis was 38, XX. FISH panel was normal.  However quality of specimen is compromised. -LDH  normal, beta-2 microglobulin 2.9.  M spike was 2.1 g.  Kappa light chains 19.7, lambda light chains 14.3 with ratio of 1.38. -24-hour urine shows nonnephrotic range proteinuria.  Urine immunofixation was positive. - 9 cycles of RVD from 10/29/2019 through 05/02/2020. - Because of her age and comorbidities, she was not considered a candidate for bone marrow transplant.   PLAN:  1.  IgG lambda plasma cell myeloma: - We are holding Revlimid as she was feeling very weak. - Last myeloma panel on 01/27/2021 showed M spike was negative with immunofixation normal.  Light chain ratio was also normal. - She reports improvement in energy levels.  Her right buttock pressure sore is also healing. - Reviewed labs today which showed creatinine 1.2 with normal calcium.  LDH and CBC was grossly normal.  Myeloma panel is pending. - We will continue to hold Revlimid at this time.  We will reevaluate her in 4 weeks.   2.  Diarrhea: - Diarrhea improved since magnesium cut back  to once daily.   3.  Generalized body pains: - Recent MRI did not show any myeloma in the lumbar spine although it showed severe stenosis at L3-4 and L4-5. - She is currently taking tramadol 50 mg every 4 hours.  She reports improvement in pain. - She is also doing physical therapy at home.  4.  Bone protection: - Calcium today is 8.9 with albumin 3.9. - She will receive Xgeva today.  5.  Hypokalemia: - Potassium today is 3.9.  Continue potassium supplements.   Orders placed this encounter:  No orders of the defined types were placed in this encounter.    Derek Jack, MD [place] [phone]   I, Thana Ates, am acting as a scribe for Dr. Derek Jack.  I, Derek Jack MD, have reviewed the above documentation for accuracy and completeness, and I agree with the above.

## 2021-03-17 NOTE — Patient Instructions (Signed)
[facility]  Discharge Instructions: Thank you for choosing [facility] to provide your oncology and hematology care.  If you have a lab appointment with the Cancer Center, please come in thru the Main Entrance and check in at the main information desk.  Wear comfortable clothing and clothing appropriate for easy access to any Portacath or PICC line.   We strive to give you quality time with your provider. You may need to reschedule your appointment if you arrive late (15 or more minutes).  Arriving late affects you and other patients whose appointments are after yours.  Also, if you miss three or more appointments without notifying the office, you may be dismissed from the clinic at the provider's discretion.      For prescription refill requests, have your pharmacy contact our office and allow 72 hours for refills to be completed.        To help prevent nausea and vomiting after your treatment, we encourage you to take your nausea medication as directed.  BELOW ARE SYMPTOMS THAT SHOULD BE REPORTED IMMEDIATELY: *FEVER GREATER THAN 100.4 F (38 C) OR HIGHER *CHILLS OR SWEATING *NAUSEA AND VOMITING THAT IS NOT CONTROLLED WITH YOUR NAUSEA MEDICATION *UNUSUAL SHORTNESS OF BREATH *UNUSUAL BRUISING OR BLEEDING *URINARY PROBLEMS (pain or burning when urinating, or frequent urination) *BOWEL PROBLEMS (unusual diarrhea, constipation, pain near the anus) TENDERNESS IN MOUTH AND THROAT WITH OR WITHOUT PRESENCE OF ULCERS (sore throat, sores in mouth, or a toothache) UNUSUAL RASH, SWELLING OR PAIN  UNUSUAL VAGINAL DISCHARGE OR ITCHING   Items with * indicate a potential emergency and should be followed up as soon as possible or go to the Emergency Department if any problems should occur.  Please show the CHEMOTHERAPY ALERT CARD or IMMUNOTHERAPY ALERT CARD at check-in to the Emergency Department and triage nurse.  Should you have questions after your visit or need to cancel  or reschedule your appointment, please contact [facility] [phone]  and follow the prompts.  Office hours are 8:00 a.m. to 4:30 p.m. Monday - Friday. Please note that voicemails left after 4:00 p.m. may not be returned until the following business day.  We are closed weekends and major holidays. You have access to a nurse at all times for urgent questions. Please call the main number to the clinic [phone] and follow the prompts.  For any non-urgent questions, you may also contact your provider using MyChart. We now offer e-Visits for anyone 18 and older to request care online for non-urgent symptoms. For details visit [email or web address].   Also download the MyChart app! Go to the app store, search "MyChart", open the app, select [facility], and log in with your MyChart username and password.  Due to Covid, a mask is required upon entering the hospital/clinic. If you do not have a mask, one will be given to you upon arrival. For doctor visits, patients may have 1 support person aged 18 or older with them. For treatment visits, patients cannot have anyone with them due to current Covid guidelines and our immunocompromised population.  

## 2021-03-17 NOTE — Progress Notes (Signed)
Patient has been assessed, vital signs and labs have been reviewed by Dr. Delton Coombes. ANC, Creatinine, LFTs, and Platelets are within treatment parameters per Dr. Delton Coombes. The patient is good to proceed with Xgeva treatment at this time.  Primary RN and pharmacy aware.

## 2021-03-17 NOTE — Patient Instructions (Signed)
[place] at [facility] Discharge Instructions  You were seen and examined today by Dr. Delton Coombes. He reviewed your most recent labs and everything looks good. Continue holding Revlimid pill. Continue doing physical therapy at home as this will help you get stronger. Please keep follow up appointment in 4 weeks.  You received your Xgeva injection today.   Thank you for choosing [place] at [facility] to provide your oncology and hematology care.  To afford each patient quality time with our provider, please arrive at least 15 minutes before your scheduled appointment time.   If you have a lab appointment with the [facility] please come in thru the Main Entrance and check in at the main information desk.  You need to re-schedule your appointment should you arrive 10 or more minutes late.  We strive to give you quality time with our providers, and arriving late affects you and other patients whose appointments are after yours.  Also, if you no show three or more times for appointments you may be dismissed from the clinic at the providers discretion.     Again, thank you for choosing [facility].  Our hope is that these requests will decrease the amount of time that you wait before being seen by our physicians.       _____________________________________________________________  Should you have questions after your visit to [facility], please contact our office at [phone] and follow the prompts.  Our office hours are 8:00 a.m. and 4:30 p.m. Monday - Friday.  Please note that voicemails left after 4:00 p.m. may not be returned until the following business day.  We are closed weekends and major holidays.  You do have access to a nurse 24-7, just call the main number to the clinic [phone] and do not press any options, hold on the line and a nurse will answer the phone.    For prescription refill requests, have  your pharmacy contact our office and allow 72 hours.    Due to Covid, you will need to wear a mask upon entering the hospital. If you do not have a mask, a mask will be given to you at the Main Entrance upon arrival. For doctor visits, patients may have 1 support person age 68 or older with them. For treatment visits, patients can not have anyone with them due to social distancing guidelines and our immunocompromised population.

## 2021-03-17 NOTE — Progress Notes (Signed)
Patient taking calcium as directed. Denied tooth, jaw, and leg pain. No recent or upcoming dental visits. Labs reviewed. Patient tolerated injection with no complaints voiced. See MAR for details. Patient stable during and after injection. Site clean and dry with no bruising or swelling noted. Band aid applied. Vss with discharge and left in satisfactory condition with no s/s of distress.  

## 2021-03-18 LAB — KAPPA/LAMBDA LIGHT CHAINS
Kappa free light chain: 39.5 mg/L — ABNORMAL HIGH (ref 3.3–19.4)
Kappa, lambda light chain ratio: 1.73 — ABNORMAL HIGH (ref 0.26–1.65)
Lambda free light chains: 22.8 mg/L (ref 5.7–26.3)

## 2021-03-19 DIAGNOSIS — C9000 Multiple myeloma not having achieved remission: Secondary | ICD-10-CM | POA: Diagnosis not present

## 2021-03-19 DIAGNOSIS — I129 Hypertensive chronic kidney disease with stage 1 through stage 4 chronic kidney disease, or unspecified chronic kidney disease: Secondary | ICD-10-CM | POA: Diagnosis not present

## 2021-03-19 DIAGNOSIS — L89311 Pressure ulcer of right buttock, stage 1: Secondary | ICD-10-CM | POA: Diagnosis not present

## 2021-03-19 DIAGNOSIS — N183 Chronic kidney disease, stage 3 unspecified: Secondary | ICD-10-CM | POA: Diagnosis not present

## 2021-03-19 DIAGNOSIS — D649 Anemia, unspecified: Secondary | ICD-10-CM | POA: Diagnosis not present

## 2021-03-19 DIAGNOSIS — E1122 Type 2 diabetes mellitus with diabetic chronic kidney disease: Secondary | ICD-10-CM | POA: Diagnosis not present

## 2021-03-19 LAB — PROTEIN ELECTROPHORESIS, SERUM
A/G Ratio: 1.2 (ref 0.7–1.7)
Albumin ELP: 3.7 g/dL (ref 2.9–4.4)
Alpha-1-Globulin: 0.2 g/dL (ref 0.0–0.4)
Alpha-2-Globulin: 0.7 g/dL (ref 0.4–1.0)
Beta Globulin: 1.3 g/dL (ref 0.7–1.3)
Gamma Globulin: 0.8 g/dL (ref 0.4–1.8)
Globulin, Total: 3.1 g/dL (ref 2.2–3.9)
M-Spike, %: 0.2 g/dL — ABNORMAL HIGH
Total Protein ELP: 6.8 g/dL (ref 6.0–8.5)

## 2021-03-20 DIAGNOSIS — I129 Hypertensive chronic kidney disease with stage 1 through stage 4 chronic kidney disease, or unspecified chronic kidney disease: Secondary | ICD-10-CM | POA: Diagnosis not present

## 2021-03-20 DIAGNOSIS — E1122 Type 2 diabetes mellitus with diabetic chronic kidney disease: Secondary | ICD-10-CM | POA: Diagnosis not present

## 2021-03-20 DIAGNOSIS — N183 Chronic kidney disease, stage 3 unspecified: Secondary | ICD-10-CM | POA: Diagnosis not present

## 2021-03-20 DIAGNOSIS — L89311 Pressure ulcer of right buttock, stage 1: Secondary | ICD-10-CM | POA: Diagnosis not present

## 2021-03-20 DIAGNOSIS — D649 Anemia, unspecified: Secondary | ICD-10-CM | POA: Diagnosis not present

## 2021-03-20 DIAGNOSIS — C9000 Multiple myeloma not having achieved remission: Secondary | ICD-10-CM | POA: Diagnosis not present

## 2021-03-21 DIAGNOSIS — D649 Anemia, unspecified: Secondary | ICD-10-CM | POA: Diagnosis not present

## 2021-03-21 DIAGNOSIS — H409 Unspecified glaucoma: Secondary | ICD-10-CM | POA: Diagnosis not present

## 2021-03-21 DIAGNOSIS — Z9181 History of falling: Secondary | ICD-10-CM | POA: Diagnosis not present

## 2021-03-21 DIAGNOSIS — E876 Hypokalemia: Secondary | ICD-10-CM | POA: Diagnosis not present

## 2021-03-21 DIAGNOSIS — E669 Obesity, unspecified: Secondary | ICD-10-CM | POA: Diagnosis not present

## 2021-03-21 DIAGNOSIS — E1122 Type 2 diabetes mellitus with diabetic chronic kidney disease: Secondary | ICD-10-CM | POA: Diagnosis not present

## 2021-03-21 DIAGNOSIS — R197 Diarrhea, unspecified: Secondary | ICD-10-CM | POA: Diagnosis not present

## 2021-03-21 DIAGNOSIS — N3281 Overactive bladder: Secondary | ICD-10-CM | POA: Diagnosis not present

## 2021-03-21 DIAGNOSIS — Z6827 Body mass index (BMI) 27.0-27.9, adult: Secondary | ICD-10-CM | POA: Diagnosis not present

## 2021-03-21 DIAGNOSIS — I129 Hypertensive chronic kidney disease with stage 1 through stage 4 chronic kidney disease, or unspecified chronic kidney disease: Secondary | ICD-10-CM | POA: Diagnosis not present

## 2021-03-21 DIAGNOSIS — E039 Hypothyroidism, unspecified: Secondary | ICD-10-CM | POA: Diagnosis not present

## 2021-03-21 DIAGNOSIS — Z7984 Long term (current) use of oral hypoglycemic drugs: Secondary | ICD-10-CM | POA: Diagnosis not present

## 2021-03-21 DIAGNOSIS — Z7982 Long term (current) use of aspirin: Secondary | ICD-10-CM | POA: Diagnosis not present

## 2021-03-21 DIAGNOSIS — R634 Abnormal weight loss: Secondary | ICD-10-CM | POA: Diagnosis not present

## 2021-03-21 DIAGNOSIS — L89311 Pressure ulcer of right buttock, stage 1: Secondary | ICD-10-CM | POA: Diagnosis not present

## 2021-03-21 DIAGNOSIS — E785 Hyperlipidemia, unspecified: Secondary | ICD-10-CM | POA: Diagnosis not present

## 2021-03-21 DIAGNOSIS — K219 Gastro-esophageal reflux disease without esophagitis: Secondary | ICD-10-CM | POA: Diagnosis not present

## 2021-03-21 DIAGNOSIS — Z79891 Long term (current) use of opiate analgesic: Secondary | ICD-10-CM | POA: Diagnosis not present

## 2021-03-21 DIAGNOSIS — N183 Chronic kidney disease, stage 3 unspecified: Secondary | ICD-10-CM | POA: Diagnosis not present

## 2021-03-21 DIAGNOSIS — C9000 Multiple myeloma not having achieved remission: Secondary | ICD-10-CM | POA: Diagnosis not present

## 2021-03-21 DIAGNOSIS — M1990 Unspecified osteoarthritis, unspecified site: Secondary | ICD-10-CM | POA: Diagnosis not present

## 2021-03-21 DIAGNOSIS — I2510 Atherosclerotic heart disease of native coronary artery without angina pectoris: Secondary | ICD-10-CM | POA: Diagnosis not present

## 2021-03-23 LAB — IMMUNOFIXATION ELECTROPHORESIS
IgA: 412 mg/dL (ref 64–422)
IgG (Immunoglobin G), Serum: 947 mg/dL (ref 586–1602)
IgM (Immunoglobulin M), Srm: 21 mg/dL — ABNORMAL LOW (ref 26–217)
Total Protein ELP: 6.9 g/dL (ref 6.0–8.5)

## 2021-03-24 DIAGNOSIS — I129 Hypertensive chronic kidney disease with stage 1 through stage 4 chronic kidney disease, or unspecified chronic kidney disease: Secondary | ICD-10-CM | POA: Diagnosis not present

## 2021-03-24 DIAGNOSIS — D649 Anemia, unspecified: Secondary | ICD-10-CM | POA: Diagnosis not present

## 2021-03-24 DIAGNOSIS — N183 Chronic kidney disease, stage 3 unspecified: Secondary | ICD-10-CM | POA: Diagnosis not present

## 2021-03-24 DIAGNOSIS — E1122 Type 2 diabetes mellitus with diabetic chronic kidney disease: Secondary | ICD-10-CM | POA: Diagnosis not present

## 2021-03-24 DIAGNOSIS — L89311 Pressure ulcer of right buttock, stage 1: Secondary | ICD-10-CM | POA: Diagnosis not present

## 2021-03-24 DIAGNOSIS — C9000 Multiple myeloma not having achieved remission: Secondary | ICD-10-CM | POA: Diagnosis not present

## 2021-03-26 DIAGNOSIS — C9000 Multiple myeloma not having achieved remission: Secondary | ICD-10-CM | POA: Diagnosis not present

## 2021-03-26 DIAGNOSIS — I129 Hypertensive chronic kidney disease with stage 1 through stage 4 chronic kidney disease, or unspecified chronic kidney disease: Secondary | ICD-10-CM | POA: Diagnosis not present

## 2021-03-26 DIAGNOSIS — D649 Anemia, unspecified: Secondary | ICD-10-CM | POA: Diagnosis not present

## 2021-03-26 DIAGNOSIS — N183 Chronic kidney disease, stage 3 unspecified: Secondary | ICD-10-CM | POA: Diagnosis not present

## 2021-03-26 DIAGNOSIS — L89311 Pressure ulcer of right buttock, stage 1: Secondary | ICD-10-CM | POA: Diagnosis not present

## 2021-03-26 DIAGNOSIS — E1122 Type 2 diabetes mellitus with diabetic chronic kidney disease: Secondary | ICD-10-CM | POA: Diagnosis not present

## 2021-03-31 ENCOUNTER — Encounter: Payer: Self-pay | Admitting: *Deleted

## 2021-03-31 DIAGNOSIS — D649 Anemia, unspecified: Secondary | ICD-10-CM | POA: Diagnosis not present

## 2021-03-31 DIAGNOSIS — E1122 Type 2 diabetes mellitus with diabetic chronic kidney disease: Secondary | ICD-10-CM | POA: Diagnosis not present

## 2021-03-31 DIAGNOSIS — L89311 Pressure ulcer of right buttock, stage 1: Secondary | ICD-10-CM | POA: Diagnosis not present

## 2021-03-31 DIAGNOSIS — N183 Chronic kidney disease, stage 3 unspecified: Secondary | ICD-10-CM | POA: Diagnosis not present

## 2021-03-31 DIAGNOSIS — C9000 Multiple myeloma not having achieved remission: Secondary | ICD-10-CM | POA: Diagnosis not present

## 2021-03-31 DIAGNOSIS — I129 Hypertensive chronic kidney disease with stage 1 through stage 4 chronic kidney disease, or unspecified chronic kidney disease: Secondary | ICD-10-CM | POA: Diagnosis not present

## 2021-04-02 DIAGNOSIS — I129 Hypertensive chronic kidney disease with stage 1 through stage 4 chronic kidney disease, or unspecified chronic kidney disease: Secondary | ICD-10-CM | POA: Diagnosis not present

## 2021-04-02 DIAGNOSIS — N183 Chronic kidney disease, stage 3 unspecified: Secondary | ICD-10-CM | POA: Diagnosis not present

## 2021-04-02 DIAGNOSIS — L89311 Pressure ulcer of right buttock, stage 1: Secondary | ICD-10-CM | POA: Diagnosis not present

## 2021-04-02 DIAGNOSIS — C9000 Multiple myeloma not having achieved remission: Secondary | ICD-10-CM | POA: Diagnosis not present

## 2021-04-02 DIAGNOSIS — D649 Anemia, unspecified: Secondary | ICD-10-CM | POA: Diagnosis not present

## 2021-04-02 DIAGNOSIS — E1122 Type 2 diabetes mellitus with diabetic chronic kidney disease: Secondary | ICD-10-CM | POA: Diagnosis not present

## 2021-04-03 ENCOUNTER — Other Ambulatory Visit (HOSPITAL_COMMUNITY): Payer: Self-pay | Admitting: Hematology

## 2021-04-03 DIAGNOSIS — D649 Anemia, unspecified: Secondary | ICD-10-CM | POA: Diagnosis not present

## 2021-04-03 DIAGNOSIS — L89311 Pressure ulcer of right buttock, stage 1: Secondary | ICD-10-CM | POA: Diagnosis not present

## 2021-04-03 DIAGNOSIS — C9000 Multiple myeloma not having achieved remission: Secondary | ICD-10-CM | POA: Diagnosis not present

## 2021-04-03 DIAGNOSIS — E1122 Type 2 diabetes mellitus with diabetic chronic kidney disease: Secondary | ICD-10-CM | POA: Diagnosis not present

## 2021-04-03 DIAGNOSIS — N183 Chronic kidney disease, stage 3 unspecified: Secondary | ICD-10-CM | POA: Diagnosis not present

## 2021-04-03 DIAGNOSIS — I129 Hypertensive chronic kidney disease with stage 1 through stage 4 chronic kidney disease, or unspecified chronic kidney disease: Secondary | ICD-10-CM | POA: Diagnosis not present

## 2021-04-03 NOTE — Telephone Encounter (Signed)
Pls refill

## 2021-04-06 ENCOUNTER — Encounter (HOSPITAL_COMMUNITY): Payer: Self-pay | Admitting: Hematology

## 2021-04-06 DIAGNOSIS — Z20822 Contact with and (suspected) exposure to covid-19: Secondary | ICD-10-CM | POA: Diagnosis not present

## 2021-04-07 ENCOUNTER — Other Ambulatory Visit (HOSPITAL_COMMUNITY): Payer: Self-pay | Admitting: *Deleted

## 2021-04-07 ENCOUNTER — Telehealth (HOSPITAL_COMMUNITY): Payer: Self-pay | Admitting: *Deleted

## 2021-04-07 DIAGNOSIS — L89311 Pressure ulcer of right buttock, stage 1: Secondary | ICD-10-CM | POA: Diagnosis not present

## 2021-04-07 DIAGNOSIS — D649 Anemia, unspecified: Secondary | ICD-10-CM | POA: Diagnosis not present

## 2021-04-07 DIAGNOSIS — I129 Hypertensive chronic kidney disease with stage 1 through stage 4 chronic kidney disease, or unspecified chronic kidney disease: Secondary | ICD-10-CM | POA: Diagnosis not present

## 2021-04-07 DIAGNOSIS — N183 Chronic kidney disease, stage 3 unspecified: Secondary | ICD-10-CM | POA: Diagnosis not present

## 2021-04-07 DIAGNOSIS — C9000 Multiple myeloma not having achieved remission: Secondary | ICD-10-CM | POA: Diagnosis not present

## 2021-04-07 DIAGNOSIS — E1122 Type 2 diabetes mellitus with diabetic chronic kidney disease: Secondary | ICD-10-CM | POA: Diagnosis not present

## 2021-04-07 MED ORDER — CIPROFLOXACIN HCL 500 MG PO TABS: 500.0000 mg | ORAL_TABLET | Freq: Two times a day (BID) | ORAL | 0 refills | Status: AC

## 2021-04-07 NOTE — Telephone Encounter (Signed)
Documentation entered in order enounter

## 2021-04-07 NOTE — Progress Notes (Signed)
Patient's daughter called to clarify that Tramadol had been sent to her pharmacy and I confirmed that it was sent yesterday.  She also states that her urine is very strong and she is going frequently.  Per Dr.Katragadda, Cipro 500 mg x 5 days was sent as well.  [place] to advise of new script so that they coud be dispensed at the same time.  Daughter aware.

## 2021-04-09 DIAGNOSIS — N183 Chronic kidney disease, stage 3 unspecified: Secondary | ICD-10-CM | POA: Diagnosis not present

## 2021-04-09 DIAGNOSIS — I129 Hypertensive chronic kidney disease with stage 1 through stage 4 chronic kidney disease, or unspecified chronic kidney disease: Secondary | ICD-10-CM | POA: Diagnosis not present

## 2021-04-09 DIAGNOSIS — C9000 Multiple myeloma not having achieved remission: Secondary | ICD-10-CM | POA: Diagnosis not present

## 2021-04-09 DIAGNOSIS — L89311 Pressure ulcer of right buttock, stage 1: Secondary | ICD-10-CM | POA: Diagnosis not present

## 2021-04-09 DIAGNOSIS — D649 Anemia, unspecified: Secondary | ICD-10-CM | POA: Diagnosis not present

## 2021-04-09 DIAGNOSIS — E1122 Type 2 diabetes mellitus with diabetic chronic kidney disease: Secondary | ICD-10-CM | POA: Diagnosis not present

## 2021-04-13 DIAGNOSIS — I129 Hypertensive chronic kidney disease with stage 1 through stage 4 chronic kidney disease, or unspecified chronic kidney disease: Secondary | ICD-10-CM | POA: Diagnosis not present

## 2021-04-13 DIAGNOSIS — E1122 Type 2 diabetes mellitus with diabetic chronic kidney disease: Secondary | ICD-10-CM | POA: Diagnosis not present

## 2021-04-13 DIAGNOSIS — C9000 Multiple myeloma not having achieved remission: Secondary | ICD-10-CM | POA: Diagnosis not present

## 2021-04-13 DIAGNOSIS — D649 Anemia, unspecified: Secondary | ICD-10-CM | POA: Diagnosis not present

## 2021-04-13 DIAGNOSIS — L89311 Pressure ulcer of right buttock, stage 1: Secondary | ICD-10-CM | POA: Diagnosis not present

## 2021-04-13 DIAGNOSIS — N183 Chronic kidney disease, stage 3 unspecified: Secondary | ICD-10-CM | POA: Diagnosis not present

## 2021-04-14 ENCOUNTER — Inpatient Hospital Stay (HOSPITAL_COMMUNITY): Payer: Medicare Other

## 2021-04-14 ENCOUNTER — Other Ambulatory Visit: Payer: Self-pay

## 2021-04-14 ENCOUNTER — Inpatient Hospital Stay (HOSPITAL_BASED_OUTPATIENT_CLINIC_OR_DEPARTMENT_OTHER): Payer: Medicare Other | Admitting: Hematology

## 2021-04-14 ENCOUNTER — Inpatient Hospital Stay (HOSPITAL_COMMUNITY): Payer: Medicare Other | Attending: Hematology

## 2021-04-14 DIAGNOSIS — C9000 Multiple myeloma not having achieved remission: Secondary | ICD-10-CM | POA: Insufficient documentation

## 2021-04-14 DIAGNOSIS — D649 Anemia, unspecified: Secondary | ICD-10-CM

## 2021-04-14 LAB — CBC WITH DIFFERENTIAL/PLATELET
Abs Immature Granulocytes: 0.12 10*3/uL — ABNORMAL HIGH (ref 0.00–0.07)
Basophils Absolute: 0.1 10*3/uL (ref 0.0–0.1)
Basophils Relative: 1 %
Eosinophils Absolute: 0.2 10*3/uL (ref 0.0–0.5)
Eosinophils Relative: 2 %
HCT: 38.3 % (ref 36.0–46.0)
Hemoglobin: 11.6 g/dL — ABNORMAL LOW (ref 12.0–15.0)
Immature Granulocytes: 2 %
Lymphocytes Relative: 38 %
Lymphs Abs: 2.9 10*3/uL (ref 0.7–4.0)
MCH: 29.1 pg (ref 26.0–34.0)
MCHC: 30.3 g/dL (ref 30.0–36.0)
MCV: 96 fL (ref 80.0–100.0)
Monocytes Absolute: 0.8 10*3/uL (ref 0.1–1.0)
Monocytes Relative: 10 %
Neutro Abs: 3.7 10*3/uL (ref 1.7–7.7)
Neutrophils Relative %: 47 %
Platelets: 218 10*3/uL (ref 150–400)
RBC: 3.99 MIL/uL (ref 3.87–5.11)
RDW: 16.8 % — ABNORMAL HIGH (ref 11.5–15.5)
WBC: 7.7 10*3/uL (ref 4.0–10.5)
nRBC: 0.3 % — ABNORMAL HIGH (ref 0.0–0.2)

## 2021-04-14 LAB — COMPREHENSIVE METABOLIC PANEL
ALT: 24 U/L (ref 0–44)
AST: 21 U/L (ref 15–41)
Albumin: 3.9 g/dL (ref 3.5–5.0)
Alkaline Phosphatase: 66 U/L (ref 38–126)
Anion gap: 5 (ref 5–15)
BUN: 23 mg/dL (ref 8–23)
CO2: 26 mmol/L (ref 22–32)
Calcium: 9.3 mg/dL (ref 8.9–10.3)
Chloride: 110 mmol/L (ref 98–111)
Creatinine, Ser: 1.06 mg/dL — ABNORMAL HIGH (ref 0.44–1.00)
GFR, Estimated: 54 mL/min — ABNORMAL LOW (ref >60)
Glucose, Bld: 146 mg/dL — ABNORMAL HIGH (ref 70–99)
Potassium: 4.2 mmol/L (ref 3.5–5.1)
Sodium: 141 mmol/L (ref 135–145)
Total Bilirubin: 0.4 mg/dL (ref 0.3–1.2)
Total Protein: 7.3 g/dL (ref 6.5–8.1)

## 2021-04-14 MED ORDER — DENOSUMAB 120 MG/1.7ML ~~LOC~~ SOLN
120.0000 mg | Freq: Once | SUBCUTANEOUS | Status: AC
  Administered 2021-04-14: 120 mg via SUBCUTANEOUS
  Filled 2021-04-14: qty 1.7

## 2021-04-14 NOTE — Progress Notes (Signed)
Xgeva injection given per orders. Patient states she is taking her calcium and vit D. No dental procedures soon and denies any jaw pain.  ? ?Patient tolerated it well without problems. Vitals stable and discharged home from clinic via wheelchair.  Follow up as scheduled. ? ?

## 2021-04-14 NOTE — Progress Notes (Signed)
[place], [place] [address]   CLINIC:  Medical Oncology/Hematology  PCP:  Lindell Spar, MD [address] / [place] [address] [phone]   REASON FOR VISIT:  Follow-up for multiple myeloma & normocytic anemia  PRIOR THERAPY:  Velcade x 9 cycles from 10/29/2019 to 05/02/2020 Revlimid 2/3 weeks  CURRENT THERAPY: surveillance  BRIEF ONCOLOGIC HISTORY:  Oncology History  Multiple myeloma not having achieved remission ([facility])  10/13/2019 Initial Diagnosis   Multiple myeloma not having achieved remission ([facility])   10/29/2019 -  Chemotherapy   Patient is on Treatment Plan : MYELOMA NON-TRANSPLANT CANDIDATES VRd weekly q21d       CANCER STAGING:  Cancer Staging  No matching staging information was found for the patient.  INTERVAL HISTORY:  [name], a 81 y.o. female, returns for routine follow-up of her multiple myeloma & normocytic anemia. Kim Mills was last seen on 03/17/2021.   Today she reports feeling good, and she is accompanied by her daughter. She is taking tramadol every 2-3 hours for her knee and back pain; she denies associated drowsiness. She reports continued fatigue.   REVIEW OF SYSTEMS:  Review of Systems  Constitutional:  Positive for fatigue. Negative for appetite change.  Musculoskeletal:  Positive for arthralgias (7/10 knee) and back pain.  Neurological:  Positive for numbness.  Psychiatric/Behavioral:  Positive for sleep disturbance.   All other systems reviewed and are negative.  PAST MEDICAL/SURGICAL HISTORY:  Past Medical History:  Diagnosis Date   Arthritis    Cancer [facility])    Cataract    Chest pain    Associated with weakness and fatigue   Diabetes mellitus    A1c of 7.4 in 08/2010   GERD (gastroesophageal reflux disease)    Glaucoma    Hyperlipidemia    Lipid profile in 08/2010:190, 121, 48, 118; normal CBC and CMet   Hypertension    Lipid profile in 08/2010:190, 121, 48, 118; normal CBC  and CMet   Hypothyroidism    Hypothyroidism    Obesity    Past Surgical History:  Procedure Laterality Date   CHOLECYSTECTOMY     COLONOSCOPY N/A 04/01/2014   RMR: Melanosis coli. colonic polyps removed as described above.    CYSTOSCOPY W/ URETERAL STENT PLACEMENT Right 09/13/2019   Procedure: CYSTOSCOPY WITH RIGHT  RETROGRADE PYELOGRAM; BLADDER BIOPSY;  Surgeon: Cleon Gustin, MD;  Location: [facility];  Service: Urology;  Laterality: Right;   LEFT HEART CATH AND CORONARY ANGIOGRAPHY N/A 10/07/2017   Procedure: LEFT HEART CATH AND CORONARY ANGIOGRAPHY;  Surgeon: Troy Sine, MD;  Location: [facility];  Service: Cardiovascular;  Laterality: N/A;   THYROIDECTOMY, PARTIAL      SOCIAL HISTORY:  Social History   Socioeconomic History   Marital status: Widowed    Spouse name: Not on file   Number of children: 7   Years of education: Not on file   Highest education level: Not on file  Occupational History    Employer: RETIRED  Tobacco Use   Smoking status: Never    Passive exposure: Yes   Smokeless tobacco: Never  Vaping Use   Vaping Use: Never used  Substance and Sexual Activity   Alcohol use: Never   Drug use: Never   Sexual activity: Not Currently  Other Topics Concern   Not on file  Social History Narrative   Daughter, Corky Sing, lives with and assists patient.    Social Determinants of Health  Financial Resource Strain: Low Risk    Difficulty of Paying Living Expenses: Not hard at all  Food Insecurity: No Food Insecurity   Worried About Charity fundraiser in the Last Year: Never true   Ran Out of Food in the Last Year: Never true  Transportation Needs: No Transportation Needs   Lack of Transportation (Medical): No   Lack of Transportation (Non-Medical): No  Physical Activity: Sufficiently Active   Days of Exercise per Week: 5 days   Minutes of Exercise per Session: 30 min  Stress: No Stress Concern Present   Feeling of Stress : Not at all  Social  Connections: Moderately Integrated   Frequency of Communication with Friends and Family: More than three times a week   Frequency of Social Gatherings with Friends and Family: More than three times a week   Attends Religious Services: 1 to 4 times per year   Active Member of [employer] or Organizations: Yes   Attends Archivist Meetings: 1 to 4 times per year   Marital Status: Widowed  Human resources officer Violence: Not At Risk   Fear of Current or Ex-Partner: No   Emotionally Abused: No   Physically Abused: No   Sexually Abused: No    FAMILY HISTORY:  Family History  Problem Relation Age of Onset   Anemia Father    Arthritis Mother    Stroke Brother    Cancer Sister        unknown kind   Diabetes Sister    Dementia Sister    Cancer Niece        Breast   Cancer Brother    Healthy Son    Healthy Son    Healthy Son    Healthy Daughter    Healthy Daughter    Healthy Daughter    Sarcoidosis Daughter     CURRENT MEDICATIONS:  Current Outpatient Medications  Medication Sig Dispense Refill   acyclovir (ZOVIRAX) 400 MG tablet TAKE ONE TABLET (400MG TOTAL) BY MOUTH TWICE DAILY 60 tablet 6   albuterol (VENTOLIN HFA) 108 (90 Base) MCG/ACT inhaler Inhale 2 puffs into the lungs every 6 (six) hours as needed for wheezing or shortness of breath. 8 g 0   ASPIRIN 81 PO Take 1 tablet by mouth daily.     blood glucose meter kit and supplies Dispense based on patient and insurance preference. Use up to four times daily as directed. (FOR ICD-10 E10.9, E11.9). 1 each 0   Blood Glucose Monitoring Suppl (BLOOD GLUCOSE SYSTEM PAK) KIT Please dispense based on patient and insurance preference. Use as directed to monitor FSBS 2x daily. Dx: E11.9. 1 each 1   Calcium 500 MG tablet Take 600 mg by mouth 2 (two) times daily.     fluticasone (FLONASE) 50 MCG/ACT nasal spray USE TWO SPRAYS IN BOTH NOSTRILS DAILY (Patient taking differently: Place 2 sprays into both nostrils daily.) 16 g 6   gabapentin  (NEURONTIN) 300 MG capsule Take 1 capsule (300 mg total) by mouth 2 (two) times daily. Dose/frequency change 180 capsule 3   glipiZIDE (GLUCOTROL) 5 MG tablet TAKE ONE TABLET (5MG TOTAL) BY MOUTH TWOTIMES DAILY (Patient taking differently: Take 5 mg by mouth 2 (two) times daily before a meal.) 180 tablet 3   Glucose Blood (BLOOD GLUCOSE TEST STRIPS) STRP Please dispense based on patient and insurance preference. Use as directed to monitor FSBS 2x daily. Dx: E11.9. 100 each 11   guaiFENesin-dextromethorphan (ROBITUSSIN DM) 100-10 MG/5ML syrup Take 5 mLs  by mouth every 4 (four) hours as needed for cough. 118 mL 0   HM LORATADINE 10 MG tablet TAKE ONE TABLET (10MG TOTAL) BY MOUTH DAILY (Patient taking differently: Take 10 mg by mouth daily as needed for allergies.) 30 tablet 0   Lancets MISC Please dispense based on patient and insurance preference. Use as directed to monitor FSBS 2x daily. Dx: E11.9. 100 each 11   lenalidomide (REVLIMID) 10 MG capsule Take 1 capsule (10 mg total) by mouth daily. 14 days on, 7 days off 14 capsule 0   magic mouthwash w/lidocaine SOLN Take 5 mLs by mouth 4 (four) times daily. 450 mL 0   magnesium oxide (MAG-OX) 400 (240 Mg) MG tablet TAKE ONE TABLET (400MG TOTAL) BY MOUTH TWO TIMES DAILY (Patient taking differently: Take 400 mg by mouth 2 (two) times daily.) 120 tablet 1   meclizine (ANTIVERT) 25 MG tablet Take 25 mg by mouth every 6 (six) hours as needed for dizziness.     metFORMIN (GLUCOPHAGE) 1000 MG tablet TAKE ONE TABLET BY MOUTH TWICE A DAY 60 tablet 3   metoprolol tartrate (LOPRESSOR) 25 MG tablet TAKE ONE-HALF TABLET (12.5MG TOTAL) BY MOUTH TWO TIMES DAILY (Patient taking differently: Take 12.5 mg by mouth 2 (two) times daily.) 90 tablet 3   omeprazole (PRILOSEC) 40 MG capsule TAKE ONE CAPSULE BY MOUTH DAILY 90 capsule 3   oxyCODONE 10 MG TABS Take 1 tablet (10 mg total) by mouth every 6 (six) hours as needed for severe pain. 60 tablet 0   polyethylene glycol  (MIRALAX / GLYCOLAX) 17 g packet Take 17 g by mouth daily. 14 each 0   potassium chloride (KLOR-CON) 10 MEQ tablet Take 2 tablets (20 mEq total) by mouth daily. 60 tablet 2   simvastatin (ZOCOR) 40 MG tablet TAKE ONE TABLET (40MG TOTAL) BY MOUTH BEDTIME (Patient taking differently: Take 40 mg by mouth at bedtime.) 90 tablet 3   SYNTHROID 137 MCG tablet TAKE ONE TABLET (137MCG TOTAL) BY MOUTH DAILY BEFORE BREAKFAST 90 tablet 1   traMADol (ULTRAM) 50 MG tablet TAKE ONE TABLET (50MG TOTAL) BY MOUTH EVERY SIX HOURS AS NEEDED 120 tablet 0   zinc oxide (MEIJER ZINC OXIDE) 20 % ointment Apply 1 application topically as needed for irritation. 56.7 g 0   No current facility-administered medications for this visit.    ALLERGIES:  No Known Allergies  PHYSICAL EXAM:  Performance status (ECOG): 3 - Symptomatic, >50% confined to bed  There were no vitals filed for this visit. Wt Readings from Last 3 Encounters:  02/17/21 155 lb (70.3 kg)  11/11/20 160 lb 9.6 oz (72.8 kg)  11/05/20 156 lb (70.8 kg)   Physical Exam Vitals reviewed.  Constitutional:      Appearance: Normal appearance.     Comments: In wheelchair  Cardiovascular:     Rate and Rhythm: Normal rate and regular rhythm.     Pulses: Normal pulses.     Heart sounds: Normal heart sounds.  Pulmonary:     Effort: Pulmonary effort is normal.     Breath sounds: Normal breath sounds.  Neurological:     General: No focal deficit present.     Mental Status: She is alert and oriented to person, place, and time.  Psychiatric:        Mood and Affect: Mood normal.        Behavior: Behavior normal.     LABORATORY DATA:  I have reviewed the labs as listed.  CBC Latest Ref  Rng & Units 04/14/2021 03/17/2021 02/12/2021  WBC 4.0 - 10.5 K/uL 7.7 7.5 7.2  Hemoglobin 12.0 - 15.0 g/dL 11.6(L) 12.1 12.3  Hematocrit 36.0 - 46.0 % 38.3 38.6 40.0  Platelets 150 - 400 K/uL 218 197 272   CMP Latest Ref Rng & Units 04/14/2021 03/17/2021 02/12/2021  Glucose 70 - 99  mg/dL 146(H) 83 139(H)  BUN 8 - 23 mg/dL 23 32(H) 16  Creatinine 0.44 - 1.00 mg/dL 1.06(H) 1.20(H) 1.01(H)  Sodium 135 - 145 mmol/L 141 140 141  Potassium 3.5 - 5.1 mmol/L 4.2 3.9 4.4  Chloride 98 - 111 mmol/L 110 106 109  CO2 22 - 32 mmol/L _0 Calcium 8.9 - 10.3 mg/dL 9.3 8.9 8.7(L)  Total Protein 6.5 - 8.1 g/dL 7.3 7.2 6.8  Total Bilirubin 0.3 - 1.2 mg/dL 0.4 0.5 0.6  Alkaline Phos 38 - 126 U/L 66 54 78  AST 15 - 41 U/L _1 ALT 0 - 44 U/L _2 DIAGNOSTIC IMAGING:  I have independently reviewed the scans and discussed with the patient. No results found.   ASSESSMENT:  1.  Normocytic anemia: -Patient seen at the request of Dr. Buelah Manis for further work-up and management of normocytic anemia. -Recent CBC on 08/21/2019 shows hemoglobin 6.9 with MCV of 88.3.  White count and platelets are normal.  No history of CKD.  Denies any bleeding per rectum or melena. Last colonoscopy on 04/01/2014 shows diffusely pigmented rectal mucosa consistent with melanosis [name]  Diffusely pigmented colonic mucosa, 2 diminutive polyps in the mid ascending segment, otherwise the remainder of the colon mucosa was normal.  Biopsy consistent with tubular adenoma. -CT renal study on 05/11/2019 shows normal-sized spleen with normal liver.  No other abnormal adenopathy. -Denies any prior history of blood transfusion.  She is currently taking iron tablet twice daily.   2.  IgG lambda plasma cell myeloma: -Skeletal survey on 09/12/2019 shows diffuse faint lucencies in the skull, bilateral femurs. -Bone marrow biopsy on 09/27/2019 shows 70% plasma cells in hypercellular marrow.  Chromosome analysis was 4, XX. FISH panel was normal.  However quality of specimen is compromised. -LDH normal, beta-2 microglobulin 2.9.  M spike was 2.1 g.  Kappa light chains 19.7, lambda light chains 14.3 with ratio of 1.38. -24-hour urine shows nonnephrotic range proteinuria.  Urine immunofixation was  positive. - 9 cycles of RVD from 10/29/2019 through 05/02/2020. - Because of her age and comorbidities, she was not considered a candidate for bone marrow transplant.   PLAN:  1.  IgG lambda plasma cell myeloma: - Revlimid is on hold due to weakness. - He did not report any further worsening of her weakness.  Reviewed myeloma labs from 03/17/2021.  M spike is 0.2 g.  Kappa light chains 39.5 and ratio is 1.73.  Immunofixation shows IgG lambda. - There is some resurfacing of M spike although it has done it previously. - Labs from today shows normal CBC and LFTs.  Creatinine is mildly elevated at 1.06 and stable. - Recommend continue holding her treatment for myeloma. - We will plan to repeat myeloma labs in 5 weeks and see her back in 6 weeks.  If the M spike continues to go up, will consider restarting Revlimid.   2.  Diarrhea: - Diarrhea has improved since magnesium cut back to once daily.   3.  Bilateral knee pains: - She reports that her pains have gotten worse.  She is  taking tramadol for 50 mg every 3-4 hours. - She had previously injections done in the knees.  She does not want to go back to [facility]. - We will make a referral to Dr. Cecilie Lowers for further management of osteoarthritic knee pains. - We will change tramadol to 50 mg every 4 hours as needed.   4.  Bone protection: - Calcium today is 9.3.  Continue Xgeva monthly.  5.  Hypokalemia: - Potassium is 4.2.  Continue potassium supplements.   Orders placed this encounter:  No orders of the defined types were placed in this encounter.    Derek Jack, MD [place] [phone]   I, Thana Ates, am acting as a scribe for Dr. Derek Jack.  I, Derek Jack MD, have reviewed the above documentation for accuracy and completeness, and I agree with the above.

## 2021-04-14 NOTE — Patient Instructions (Addendum)
Kim Mills at [facility] ?Discharge Instructions ? ? ?You were seen and examined today by Dr. Delton Coombes. ? ?He reviewed your lab work which is normal/stable.  ? ?Return to see Dr. Amedeo Kinsman for knee pain due to arthritis. ? ?We will increase your pain medication (Tramadol) to every 4 hours. ? ?Continue to hold the Revlimid. ? ?Return as scheduled.  ? ? ?Thank you for choosing [place] [facility] to provide your oncology and hematology care.  To afford each patient quality time with our provider, please arrive at least 15 minutes before your scheduled appointment time.  ? ?If you have a lab appointment with the [place] please come in thru the Main Entrance and check in at the main information desk. ? ?You need to re-schedule your appointment should you arrive 10 or more minutes late.  We strive to give you quality time with our providers, and arriving late affects you and other patients whose appointments are after yours.  Also, if you no show three or more times for appointments you may be dismissed from the clinic at the providers discretion.     ?Again, thank you for choosing [facility].  Our hope is that these requests will decrease the amount of time that you wait before being seen by our physicians.       ?_____________________________________________________________ ? ?Should you have questions after your visit to [facility], please contact our office at [phone] and follow the prompts.  Our office hours are 8:00 a.m. and 4:30 p.m. Monday - Friday.  Please note that voicemails left after 4:00 p.m. may not be returned until the following business day.  We are closed weekends and major holidays.  You do have access to a nurse 24-7, just call the main number to the clinic [phone] and do not press any options, hold on the line and a nurse will answer the phone.   ? ?For prescription refill requests, have your pharmacy  contact our office and allow 72 hours.   ? ?Due to Covid, you will need to wear a mask upon entering the hospital. If you do not have a mask, a mask will be given to you at the Main Entrance upon arrival. For doctor visits, patients may have 1 support person age 79 or older with them. For treatment visits, patients can not have anyone with them due to social distancing guidelines and our immunocompromised population.  ? ?   ?

## 2021-04-17 ENCOUNTER — Ambulatory Visit: Payer: Medicare Other | Admitting: Orthopedic Surgery

## 2021-04-17 ENCOUNTER — Encounter (HOSPITAL_COMMUNITY): Payer: Self-pay | Admitting: Hematology

## 2021-04-17 DIAGNOSIS — C9000 Multiple myeloma not having achieved remission: Secondary | ICD-10-CM | POA: Diagnosis not present

## 2021-04-17 DIAGNOSIS — N183 Chronic kidney disease, stage 3 unspecified: Secondary | ICD-10-CM | POA: Diagnosis not present

## 2021-04-17 DIAGNOSIS — I129 Hypertensive chronic kidney disease with stage 1 through stage 4 chronic kidney disease, or unspecified chronic kidney disease: Secondary | ICD-10-CM | POA: Diagnosis not present

## 2021-04-17 DIAGNOSIS — L89311 Pressure ulcer of right buttock, stage 1: Secondary | ICD-10-CM | POA: Diagnosis not present

## 2021-04-17 DIAGNOSIS — E1122 Type 2 diabetes mellitus with diabetic chronic kidney disease: Secondary | ICD-10-CM | POA: Diagnosis not present

## 2021-04-17 DIAGNOSIS — D649 Anemia, unspecified: Secondary | ICD-10-CM | POA: Diagnosis not present

## 2021-04-20 DIAGNOSIS — E1122 Type 2 diabetes mellitus with diabetic chronic kidney disease: Secondary | ICD-10-CM | POA: Diagnosis not present

## 2021-04-20 DIAGNOSIS — I2510 Atherosclerotic heart disease of native coronary artery without angina pectoris: Secondary | ICD-10-CM | POA: Diagnosis not present

## 2021-04-20 DIAGNOSIS — Z7984 Long term (current) use of oral hypoglycemic drugs: Secondary | ICD-10-CM | POA: Diagnosis not present

## 2021-04-20 DIAGNOSIS — I129 Hypertensive chronic kidney disease with stage 1 through stage 4 chronic kidney disease, or unspecified chronic kidney disease: Secondary | ICD-10-CM | POA: Diagnosis not present

## 2021-04-20 DIAGNOSIS — R634 Abnormal weight loss: Secondary | ICD-10-CM | POA: Diagnosis not present

## 2021-04-20 DIAGNOSIS — M1990 Unspecified osteoarthritis, unspecified site: Secondary | ICD-10-CM | POA: Diagnosis not present

## 2021-04-20 DIAGNOSIS — E039 Hypothyroidism, unspecified: Secondary | ICD-10-CM | POA: Diagnosis not present

## 2021-04-20 DIAGNOSIS — Z791 Long term (current) use of non-steroidal anti-inflammatories (NSAID): Secondary | ICD-10-CM | POA: Diagnosis not present

## 2021-04-20 DIAGNOSIS — N3281 Overactive bladder: Secondary | ICD-10-CM | POA: Diagnosis not present

## 2021-04-20 DIAGNOSIS — Z79891 Long term (current) use of opiate analgesic: Secondary | ICD-10-CM | POA: Diagnosis not present

## 2021-04-20 DIAGNOSIS — Z7982 Long term (current) use of aspirin: Secondary | ICD-10-CM | POA: Diagnosis not present

## 2021-04-20 DIAGNOSIS — E669 Obesity, unspecified: Secondary | ICD-10-CM | POA: Diagnosis not present

## 2021-04-20 DIAGNOSIS — R197 Diarrhea, unspecified: Secondary | ICD-10-CM | POA: Diagnosis not present

## 2021-04-20 DIAGNOSIS — N183 Chronic kidney disease, stage 3 unspecified: Secondary | ICD-10-CM | POA: Diagnosis not present

## 2021-04-20 DIAGNOSIS — D631 Anemia in chronic kidney disease: Secondary | ICD-10-CM | POA: Diagnosis not present

## 2021-04-20 DIAGNOSIS — E876 Hypokalemia: Secondary | ICD-10-CM | POA: Diagnosis not present

## 2021-04-20 DIAGNOSIS — Z6827 Body mass index (BMI) 27.0-27.9, adult: Secondary | ICD-10-CM | POA: Diagnosis not present

## 2021-04-20 DIAGNOSIS — C9000 Multiple myeloma not having achieved remission: Secondary | ICD-10-CM | POA: Diagnosis not present

## 2021-04-20 DIAGNOSIS — Z9181 History of falling: Secondary | ICD-10-CM | POA: Diagnosis not present

## 2021-04-20 DIAGNOSIS — H409 Unspecified glaucoma: Secondary | ICD-10-CM | POA: Diagnosis not present

## 2021-04-20 DIAGNOSIS — E785 Hyperlipidemia, unspecified: Secondary | ICD-10-CM | POA: Diagnosis not present

## 2021-04-20 DIAGNOSIS — K219 Gastro-esophageal reflux disease without esophagitis: Secondary | ICD-10-CM | POA: Diagnosis not present

## 2021-04-21 DIAGNOSIS — N183 Chronic kidney disease, stage 3 unspecified: Secondary | ICD-10-CM | POA: Diagnosis not present

## 2021-04-21 DIAGNOSIS — D631 Anemia in chronic kidney disease: Secondary | ICD-10-CM | POA: Diagnosis not present

## 2021-04-21 DIAGNOSIS — I129 Hypertensive chronic kidney disease with stage 1 through stage 4 chronic kidney disease, or unspecified chronic kidney disease: Secondary | ICD-10-CM | POA: Diagnosis not present

## 2021-04-21 DIAGNOSIS — C9000 Multiple myeloma not having achieved remission: Secondary | ICD-10-CM | POA: Diagnosis not present

## 2021-04-21 DIAGNOSIS — E1122 Type 2 diabetes mellitus with diabetic chronic kidney disease: Secondary | ICD-10-CM | POA: Diagnosis not present

## 2021-04-21 DIAGNOSIS — I2510 Atherosclerotic heart disease of native coronary artery without angina pectoris: Secondary | ICD-10-CM | POA: Diagnosis not present

## 2021-04-22 ENCOUNTER — Encounter: Payer: Self-pay | Admitting: Orthopedic Surgery

## 2021-04-22 ENCOUNTER — Other Ambulatory Visit: Payer: Self-pay

## 2021-04-22 ENCOUNTER — Other Ambulatory Visit: Payer: Self-pay | Admitting: Orthopedic Surgery

## 2021-04-22 ENCOUNTER — Ambulatory Visit (INDEPENDENT_AMBULATORY_CARE_PROVIDER_SITE_OTHER): Payer: Medicare Other | Admitting: Orthopedic Surgery

## 2021-04-22 ENCOUNTER — Ambulatory Visit: Payer: Medicare Other

## 2021-04-22 DIAGNOSIS — G8929 Other chronic pain: Secondary | ICD-10-CM

## 2021-04-22 DIAGNOSIS — M25562 Pain in left knee: Secondary | ICD-10-CM | POA: Diagnosis not present

## 2021-04-22 DIAGNOSIS — M25561 Pain in right knee: Secondary | ICD-10-CM

## 2021-04-22 NOTE — Progress Notes (Signed)
Orthopaedic Clinic Return ? ?Assessment: ?MARRISA KIMBER is a 81 y.o. female with the following: ?Bilateral knee arthritis ? ? ?Plan: ?Mrs. Brownley has severe pain in bilateral knees.  It is affecting her ability to ambulate.  A single AP view of bilateral knees obtained within the last year demonstrates advanced degenerative changes.  She has had injections in the past, with excellent improvement in her symptoms.  She is interested in proceeding today.  Patient and her daughter expressed interest in hyaluronic acid injections, so we will investigate this further with her healthcare insurance company.  Follow-up as needed. ? ?Procedure note injection Right knee joint ?  ?Verbal consent was obtained to inject the right knee joint  ?Timeout was completed to confirm the site of injection.  The skin was prepped with alcohol and ethyl chloride was sprayed at the injection site.  ?A 21-gauge needle was used to inject 40 mg of Depo-Medrol and 1% lidocaine (3 cc) into the right knee using an anterolateral approach.  ?There were no complications. A sterile bandage was applied. ? ? ?Procedure note injection Left knee joint ?  ?Verbal consent was obtained to inject the left knee joint  ?Timeout was completed to confirm the site of injection.  The skin was prepped with alcohol and ethyl chloride was sprayed at the injection site.  ?A 21-gauge needle was used to inject 40 mg of Depo-Medrol and 1% lidocaine (3 cc) into the left knee using an anterolateral approach.  ?There were no complications. A sterile bandage was applied. ?  ? ? ?Follow-up: ?Return if symptoms worsen or fail to improve. ? ? ?Subjective: ? ?Chief Complaint  ?Patient presents with  ? Knee Pain  ?  Bil knee pain, for several years, worse recently.  Popping/cracking, and giving way.  She has fallen.  L>R  ? ? ?History of Present Illness: ?NIKITHA MODE is a 81 y.o. female who returns to clinic for evaluation of bilateral knee pain.  She has had progressively  worsening bilateral knee pain.  Her left knee pain is worse than her right currently.  It is affecting her ability to ambulate.  She is taking Tylenol as needed.  She has had injections in the past, with some improvement in her symptoms.  She is asking questions about the possibility of proceeding with a hyaluronic acid injection. ? ?Review of Systems: ?No fevers or chills ?No numbness or tingling ?No chest pain ?No shortness of breath ?No bowel or bladder dysfunction ?No GI distress ?No headaches ? ? ?Objective: ?There were no vitals taken for this visit. ? ?Physical Exam: ? ?Elderly female.  Seated in a wheelchair.  No acute distress. ? ?Right knee range of motion from 20-100 degrees.  No effusion on exam.  Tenderness to palpation along the medial lateral joint line.  Left knee range of motion from 3-100 degrees.  No effusion.  She does have some tenderness to palpation along the medial lateral joint lines. ? ?IMAGING: ?I personally ordered and reviewed the following images: ? ? ?Single AP view of bilateral knees demonstrates advanced degenerative changes, with near complete loss of joint space within the medial lateral compartments. ? ?Mordecai Rasmussen, MD ?04/22/2021 ?10:51 AM ? ? ?

## 2021-04-22 NOTE — Patient Instructions (Signed)

## 2021-04-23 DIAGNOSIS — N183 Chronic kidney disease, stage 3 unspecified: Secondary | ICD-10-CM | POA: Diagnosis not present

## 2021-04-23 DIAGNOSIS — E1122 Type 2 diabetes mellitus with diabetic chronic kidney disease: Secondary | ICD-10-CM | POA: Diagnosis not present

## 2021-04-23 DIAGNOSIS — I129 Hypertensive chronic kidney disease with stage 1 through stage 4 chronic kidney disease, or unspecified chronic kidney disease: Secondary | ICD-10-CM | POA: Diagnosis not present

## 2021-04-23 DIAGNOSIS — C9000 Multiple myeloma not having achieved remission: Secondary | ICD-10-CM | POA: Diagnosis not present

## 2021-04-23 DIAGNOSIS — D631 Anemia in chronic kidney disease: Secondary | ICD-10-CM | POA: Diagnosis not present

## 2021-04-23 DIAGNOSIS — I2510 Atherosclerotic heart disease of native coronary artery without angina pectoris: Secondary | ICD-10-CM | POA: Diagnosis not present

## 2021-04-24 DIAGNOSIS — C9000 Multiple myeloma not having achieved remission: Secondary | ICD-10-CM | POA: Diagnosis not present

## 2021-04-24 DIAGNOSIS — D631 Anemia in chronic kidney disease: Secondary | ICD-10-CM | POA: Diagnosis not present

## 2021-04-24 DIAGNOSIS — I129 Hypertensive chronic kidney disease with stage 1 through stage 4 chronic kidney disease, or unspecified chronic kidney disease: Secondary | ICD-10-CM | POA: Diagnosis not present

## 2021-04-24 DIAGNOSIS — N183 Chronic kidney disease, stage 3 unspecified: Secondary | ICD-10-CM | POA: Diagnosis not present

## 2021-04-24 DIAGNOSIS — E1122 Type 2 diabetes mellitus with diabetic chronic kidney disease: Secondary | ICD-10-CM | POA: Diagnosis not present

## 2021-04-24 DIAGNOSIS — I2510 Atherosclerotic heart disease of native coronary artery without angina pectoris: Secondary | ICD-10-CM | POA: Diagnosis not present

## 2021-04-28 DIAGNOSIS — E1122 Type 2 diabetes mellitus with diabetic chronic kidney disease: Secondary | ICD-10-CM | POA: Diagnosis not present

## 2021-04-28 DIAGNOSIS — N183 Chronic kidney disease, stage 3 unspecified: Secondary | ICD-10-CM | POA: Diagnosis not present

## 2021-04-28 DIAGNOSIS — D631 Anemia in chronic kidney disease: Secondary | ICD-10-CM | POA: Diagnosis not present

## 2021-04-28 DIAGNOSIS — C9000 Multiple myeloma not having achieved remission: Secondary | ICD-10-CM | POA: Diagnosis not present

## 2021-04-28 DIAGNOSIS — I129 Hypertensive chronic kidney disease with stage 1 through stage 4 chronic kidney disease, or unspecified chronic kidney disease: Secondary | ICD-10-CM | POA: Diagnosis not present

## 2021-04-28 DIAGNOSIS — I2510 Atherosclerotic heart disease of native coronary artery without angina pectoris: Secondary | ICD-10-CM | POA: Diagnosis not present

## 2021-04-29 DIAGNOSIS — I129 Hypertensive chronic kidney disease with stage 1 through stage 4 chronic kidney disease, or unspecified chronic kidney disease: Secondary | ICD-10-CM | POA: Diagnosis not present

## 2021-04-29 DIAGNOSIS — E1122 Type 2 diabetes mellitus with diabetic chronic kidney disease: Secondary | ICD-10-CM | POA: Diagnosis not present

## 2021-04-29 DIAGNOSIS — I2510 Atherosclerotic heart disease of native coronary artery without angina pectoris: Secondary | ICD-10-CM | POA: Diagnosis not present

## 2021-04-29 DIAGNOSIS — D631 Anemia in chronic kidney disease: Secondary | ICD-10-CM | POA: Diagnosis not present

## 2021-04-29 DIAGNOSIS — N183 Chronic kidney disease, stage 3 unspecified: Secondary | ICD-10-CM | POA: Diagnosis not present

## 2021-04-29 DIAGNOSIS — C9000 Multiple myeloma not having achieved remission: Secondary | ICD-10-CM | POA: Diagnosis not present

## 2021-05-01 DIAGNOSIS — C9000 Multiple myeloma not having achieved remission: Secondary | ICD-10-CM | POA: Diagnosis not present

## 2021-05-01 DIAGNOSIS — I2510 Atherosclerotic heart disease of native coronary artery without angina pectoris: Secondary | ICD-10-CM | POA: Diagnosis not present

## 2021-05-01 DIAGNOSIS — I129 Hypertensive chronic kidney disease with stage 1 through stage 4 chronic kidney disease, or unspecified chronic kidney disease: Secondary | ICD-10-CM | POA: Diagnosis not present

## 2021-05-01 DIAGNOSIS — N183 Chronic kidney disease, stage 3 unspecified: Secondary | ICD-10-CM | POA: Diagnosis not present

## 2021-05-01 DIAGNOSIS — E1122 Type 2 diabetes mellitus with diabetic chronic kidney disease: Secondary | ICD-10-CM | POA: Diagnosis not present

## 2021-05-01 DIAGNOSIS — D631 Anemia in chronic kidney disease: Secondary | ICD-10-CM | POA: Diagnosis not present

## 2021-05-04 ENCOUNTER — Encounter (HOSPITAL_COMMUNITY): Payer: Self-pay | Admitting: Hematology

## 2021-05-06 DIAGNOSIS — C9000 Multiple myeloma not having achieved remission: Secondary | ICD-10-CM | POA: Diagnosis not present

## 2021-05-06 DIAGNOSIS — I2510 Atherosclerotic heart disease of native coronary artery without angina pectoris: Secondary | ICD-10-CM | POA: Diagnosis not present

## 2021-05-06 DIAGNOSIS — D631 Anemia in chronic kidney disease: Secondary | ICD-10-CM | POA: Diagnosis not present

## 2021-05-06 DIAGNOSIS — E1122 Type 2 diabetes mellitus with diabetic chronic kidney disease: Secondary | ICD-10-CM | POA: Diagnosis not present

## 2021-05-06 DIAGNOSIS — N183 Chronic kidney disease, stage 3 unspecified: Secondary | ICD-10-CM | POA: Diagnosis not present

## 2021-05-06 DIAGNOSIS — I129 Hypertensive chronic kidney disease with stage 1 through stage 4 chronic kidney disease, or unspecified chronic kidney disease: Secondary | ICD-10-CM | POA: Diagnosis not present

## 2021-05-07 DIAGNOSIS — I129 Hypertensive chronic kidney disease with stage 1 through stage 4 chronic kidney disease, or unspecified chronic kidney disease: Secondary | ICD-10-CM | POA: Diagnosis not present

## 2021-05-07 DIAGNOSIS — N183 Chronic kidney disease, stage 3 unspecified: Secondary | ICD-10-CM | POA: Diagnosis not present

## 2021-05-07 DIAGNOSIS — E1122 Type 2 diabetes mellitus with diabetic chronic kidney disease: Secondary | ICD-10-CM | POA: Diagnosis not present

## 2021-05-07 DIAGNOSIS — D631 Anemia in chronic kidney disease: Secondary | ICD-10-CM | POA: Diagnosis not present

## 2021-05-07 DIAGNOSIS — I2510 Atherosclerotic heart disease of native coronary artery without angina pectoris: Secondary | ICD-10-CM | POA: Diagnosis not present

## 2021-05-07 DIAGNOSIS — C9000 Multiple myeloma not having achieved remission: Secondary | ICD-10-CM | POA: Diagnosis not present

## 2021-05-08 DIAGNOSIS — I129 Hypertensive chronic kidney disease with stage 1 through stage 4 chronic kidney disease, or unspecified chronic kidney disease: Secondary | ICD-10-CM | POA: Diagnosis not present

## 2021-05-08 DIAGNOSIS — N183 Chronic kidney disease, stage 3 unspecified: Secondary | ICD-10-CM | POA: Diagnosis not present

## 2021-05-08 DIAGNOSIS — C9000 Multiple myeloma not having achieved remission: Secondary | ICD-10-CM | POA: Diagnosis not present

## 2021-05-08 DIAGNOSIS — D631 Anemia in chronic kidney disease: Secondary | ICD-10-CM | POA: Diagnosis not present

## 2021-05-08 DIAGNOSIS — I2510 Atherosclerotic heart disease of native coronary artery without angina pectoris: Secondary | ICD-10-CM | POA: Diagnosis not present

## 2021-05-08 DIAGNOSIS — E1122 Type 2 diabetes mellitus with diabetic chronic kidney disease: Secondary | ICD-10-CM | POA: Diagnosis not present

## 2021-05-12 DIAGNOSIS — E1122 Type 2 diabetes mellitus with diabetic chronic kidney disease: Secondary | ICD-10-CM | POA: Diagnosis not present

## 2021-05-12 DIAGNOSIS — I2510 Atherosclerotic heart disease of native coronary artery without angina pectoris: Secondary | ICD-10-CM | POA: Diagnosis not present

## 2021-05-12 DIAGNOSIS — D631 Anemia in chronic kidney disease: Secondary | ICD-10-CM | POA: Diagnosis not present

## 2021-05-12 DIAGNOSIS — N183 Chronic kidney disease, stage 3 unspecified: Secondary | ICD-10-CM | POA: Diagnosis not present

## 2021-05-12 DIAGNOSIS — I129 Hypertensive chronic kidney disease with stage 1 through stage 4 chronic kidney disease, or unspecified chronic kidney disease: Secondary | ICD-10-CM | POA: Diagnosis not present

## 2021-05-12 DIAGNOSIS — C9000 Multiple myeloma not having achieved remission: Secondary | ICD-10-CM | POA: Diagnosis not present

## 2021-05-13 ENCOUNTER — Encounter (HOSPITAL_COMMUNITY): Payer: Self-pay | Admitting: Hematology

## 2021-05-19 ENCOUNTER — Other Ambulatory Visit (HOSPITAL_COMMUNITY): Payer: Medicare Other

## 2021-05-20 DIAGNOSIS — N3281 Overactive bladder: Secondary | ICD-10-CM | POA: Diagnosis not present

## 2021-05-20 DIAGNOSIS — R197 Diarrhea, unspecified: Secondary | ICD-10-CM | POA: Diagnosis not present

## 2021-05-20 DIAGNOSIS — N183 Chronic kidney disease, stage 3 unspecified: Secondary | ICD-10-CM | POA: Diagnosis not present

## 2021-05-20 DIAGNOSIS — R634 Abnormal weight loss: Secondary | ICD-10-CM | POA: Diagnosis not present

## 2021-05-20 DIAGNOSIS — E039 Hypothyroidism, unspecified: Secondary | ICD-10-CM | POA: Diagnosis not present

## 2021-05-20 DIAGNOSIS — Z6827 Body mass index (BMI) 27.0-27.9, adult: Secondary | ICD-10-CM | POA: Diagnosis not present

## 2021-05-20 DIAGNOSIS — I129 Hypertensive chronic kidney disease with stage 1 through stage 4 chronic kidney disease, or unspecified chronic kidney disease: Secondary | ICD-10-CM | POA: Diagnosis not present

## 2021-05-20 DIAGNOSIS — K219 Gastro-esophageal reflux disease without esophagitis: Secondary | ICD-10-CM | POA: Diagnosis not present

## 2021-05-20 DIAGNOSIS — E876 Hypokalemia: Secondary | ICD-10-CM | POA: Diagnosis not present

## 2021-05-20 DIAGNOSIS — Z7984 Long term (current) use of oral hypoglycemic drugs: Secondary | ICD-10-CM | POA: Diagnosis not present

## 2021-05-20 DIAGNOSIS — C9000 Multiple myeloma not having achieved remission: Secondary | ICD-10-CM | POA: Diagnosis not present

## 2021-05-20 DIAGNOSIS — E1122 Type 2 diabetes mellitus with diabetic chronic kidney disease: Secondary | ICD-10-CM | POA: Diagnosis not present

## 2021-05-20 DIAGNOSIS — Z9181 History of falling: Secondary | ICD-10-CM | POA: Diagnosis not present

## 2021-05-20 DIAGNOSIS — E669 Obesity, unspecified: Secondary | ICD-10-CM | POA: Diagnosis not present

## 2021-05-20 DIAGNOSIS — Z79891 Long term (current) use of opiate analgesic: Secondary | ICD-10-CM | POA: Diagnosis not present

## 2021-05-20 DIAGNOSIS — M1990 Unspecified osteoarthritis, unspecified site: Secondary | ICD-10-CM | POA: Diagnosis not present

## 2021-05-20 DIAGNOSIS — Z7982 Long term (current) use of aspirin: Secondary | ICD-10-CM | POA: Diagnosis not present

## 2021-05-20 DIAGNOSIS — I2510 Atherosclerotic heart disease of native coronary artery without angina pectoris: Secondary | ICD-10-CM | POA: Diagnosis not present

## 2021-05-20 DIAGNOSIS — E785 Hyperlipidemia, unspecified: Secondary | ICD-10-CM | POA: Diagnosis not present

## 2021-05-20 DIAGNOSIS — D631 Anemia in chronic kidney disease: Secondary | ICD-10-CM | POA: Diagnosis not present

## 2021-05-20 DIAGNOSIS — H409 Unspecified glaucoma: Secondary | ICD-10-CM | POA: Diagnosis not present

## 2021-05-20 DIAGNOSIS — Z791 Long term (current) use of non-steroidal anti-inflammatories (NSAID): Secondary | ICD-10-CM | POA: Diagnosis not present

## 2021-05-26 ENCOUNTER — Inpatient Hospital Stay (HOSPITAL_COMMUNITY): Payer: Medicare Other

## 2021-05-26 ENCOUNTER — Inpatient Hospital Stay (HOSPITAL_COMMUNITY): Payer: Medicare Other | Attending: Hematology

## 2021-05-26 ENCOUNTER — Ambulatory Visit (HOSPITAL_COMMUNITY): Payer: Medicare Other | Admitting: Hematology

## 2021-05-26 DIAGNOSIS — D649 Anemia, unspecified: Secondary | ICD-10-CM | POA: Insufficient documentation

## 2021-05-26 DIAGNOSIS — R200 Anesthesia of skin: Secondary | ICD-10-CM | POA: Insufficient documentation

## 2021-05-26 DIAGNOSIS — Z79899 Other long term (current) drug therapy: Secondary | ICD-10-CM | POA: Insufficient documentation

## 2021-05-26 DIAGNOSIS — M25562 Pain in left knee: Secondary | ICD-10-CM | POA: Insufficient documentation

## 2021-05-26 DIAGNOSIS — M25561 Pain in right knee: Secondary | ICD-10-CM | POA: Insufficient documentation

## 2021-05-26 DIAGNOSIS — C9000 Multiple myeloma not having achieved remission: Secondary | ICD-10-CM | POA: Insufficient documentation

## 2021-05-27 ENCOUNTER — Ambulatory Visit (INDEPENDENT_AMBULATORY_CARE_PROVIDER_SITE_OTHER): Payer: Medicare Other | Admitting: Internal Medicine

## 2021-05-27 ENCOUNTER — Other Ambulatory Visit (HOSPITAL_COMMUNITY)
Admission: RE | Admit: 2021-05-27 | Discharge: 2021-05-27 | Disposition: A | Discharge status: Home / Self Care | Payer: Medicare Other | Source: Ambulatory Visit | Attending: Internal Medicine | Admitting: Internal Medicine

## 2021-05-27 ENCOUNTER — Encounter: Payer: Self-pay | Admitting: Internal Medicine

## 2021-05-27 ENCOUNTER — Inpatient Hospital Stay (HOSPITAL_COMMUNITY): Payer: Medicare Other

## 2021-05-27 VITALS — BP 124/82 | HR 62 | Resp 18 | Ht 62.0 in

## 2021-05-27 DIAGNOSIS — J0110 Acute frontal sinusitis, unspecified: Secondary | ICD-10-CM | POA: Diagnosis not present

## 2021-05-27 DIAGNOSIS — E1140 Type 2 diabetes mellitus with diabetic neuropathy, unspecified: Secondary | ICD-10-CM | POA: Diagnosis not present

## 2021-05-27 DIAGNOSIS — M170 Bilateral primary osteoarthritis of knee: Secondary | ICD-10-CM | POA: Diagnosis not present

## 2021-05-27 DIAGNOSIS — N1832 Chronic kidney disease, stage 3b: Secondary | ICD-10-CM

## 2021-05-27 DIAGNOSIS — C9000 Multiple myeloma not having achieved remission: Secondary | ICD-10-CM | POA: Diagnosis not present

## 2021-05-27 DIAGNOSIS — I779 Disorder of arteries and arterioles, unspecified: Secondary | ICD-10-CM | POA: Diagnosis not present

## 2021-05-27 DIAGNOSIS — E876 Hypokalemia: Secondary | ICD-10-CM | POA: Diagnosis not present

## 2021-05-27 DIAGNOSIS — Z79899 Other long term (current) drug therapy: Secondary | ICD-10-CM | POA: Diagnosis not present

## 2021-05-27 DIAGNOSIS — M25562 Pain in left knee: Secondary | ICD-10-CM | POA: Diagnosis not present

## 2021-05-27 DIAGNOSIS — I10 Essential (primary) hypertension: Secondary | ICD-10-CM

## 2021-05-27 DIAGNOSIS — J3089 Other allergic rhinitis: Secondary | ICD-10-CM | POA: Diagnosis not present

## 2021-05-27 DIAGNOSIS — R748 Abnormal levels of other serum enzymes: Secondary | ICD-10-CM

## 2021-05-27 DIAGNOSIS — D649 Anemia, unspecified: Secondary | ICD-10-CM | POA: Diagnosis not present

## 2021-05-27 DIAGNOSIS — R200 Anesthesia of skin: Secondary | ICD-10-CM | POA: Diagnosis not present

## 2021-05-27 DIAGNOSIS — M25561 Pain in right knee: Secondary | ICD-10-CM | POA: Diagnosis not present

## 2021-05-27 LAB — CBC WITH DIFFERENTIAL/PLATELET
Abs Immature Granulocytes: 0.09 10*3/uL — ABNORMAL HIGH (ref 0.00–0.07)
Basophils Absolute: 0 10*3/uL (ref 0.0–0.1)
Basophils Relative: 0 %
Eosinophils Absolute: 0.2 10*3/uL (ref 0.0–0.5)
Eosinophils Relative: 3 %
HCT: 36.4 % (ref 36.0–46.0)
Hemoglobin: 11.5 g/dL — ABNORMAL LOW (ref 12.0–15.0)
Immature Granulocytes: 1 %
Lymphocytes Relative: 38 %
Lymphs Abs: 3.5 10*3/uL (ref 0.7–4.0)
MCH: 30.5 pg (ref 26.0–34.0)
MCHC: 31.6 g/dL (ref 30.0–36.0)
MCV: 96.6 fL (ref 80.0–100.0)
Monocytes Absolute: 0.7 10*3/uL (ref 0.1–1.0)
Monocytes Relative: 8 %
Neutro Abs: 4.5 10*3/uL (ref 1.7–7.7)
Neutrophils Relative %: 50 %
Platelets: 224 10*3/uL (ref 150–400)
RBC: 3.77 MIL/uL — ABNORMAL LOW (ref 3.87–5.11)
RDW: 14.4 % (ref 11.5–15.5)
WBC: 9 10*3/uL (ref 4.0–10.5)
nRBC: 0 % (ref 0.0–0.2)

## 2021-05-27 LAB — COMPREHENSIVE METABOLIC PANEL WITH GFR
ALT: 12 U/L (ref 0–44)
AST: 15 U/L (ref 15–41)
Albumin: 3.7 g/dL (ref 3.5–5.0)
Alkaline Phosphatase: 44 U/L (ref 38–126)
Anion gap: 8 (ref 5–15)
BUN: 27 mg/dL — ABNORMAL HIGH (ref 8–23)
CO2: 24 mmol/L (ref 22–32)
Calcium: 9.2 mg/dL (ref 8.9–10.3)
Chloride: 109 mmol/L (ref 98–111)
Creatinine, Ser: 1.12 mg/dL — ABNORMAL HIGH (ref 0.44–1.00)
GFR, Estimated: 50 mL/min — ABNORMAL LOW
Glucose, Bld: 121 mg/dL — ABNORMAL HIGH (ref 70–99)
Potassium: 4.5 mmol/L (ref 3.5–5.1)
Sodium: 141 mmol/L (ref 135–145)
Total Bilirubin: 0.6 mg/dL (ref 0.3–1.2)
Total Protein: 7.1 g/dL (ref 6.5–8.1)

## 2021-05-27 LAB — LACTATE DEHYDROGENASE: LDH: 135 U/L (ref 98–192)

## 2021-05-27 LAB — HEMOGLOBIN A1C
Hgb A1c MFr Bld: 6.1 % — ABNORMAL HIGH (ref 4.8–5.6)
Mean Plasma Glucose: 128.37 mg/dL

## 2021-05-27 LAB — MAGNESIUM: Magnesium: 1.9 mg/dL (ref 1.7–2.4)

## 2021-05-27 MED ORDER — AZITHROMYCIN 250 MG PO TABS: ORAL_TABLET | ORAL | 0 refills | Status: AC

## 2021-05-27 MED ORDER — POTASSIUM CHLORIDE CRYS ER 10 MEQ PO TBCR: 20.0000 meq | EXTENDED_RELEASE_TABLET | Freq: Every day | ORAL | 2 refills | Status: DC

## 2021-05-27 MED ORDER — FLUTICASONE PROPIONATE 50 MCG/ACT NA SUSP: 2.0000 | Freq: Every day | NASAL | 6 refills | Status: DC

## 2021-05-27 MED ORDER — TRAMADOL HCL 50 MG PO TABS: 50.0000 mg | ORAL_TABLET | Freq: Four times a day (QID) | ORAL | 0 refills | Status: DC | PRN

## 2021-05-27 NOTE — Assessment & Plan Note (Signed)
asymptomatic currently On statin 

## 2021-05-27 NOTE — Patient Instructions (Signed)
Please start taking Azithromycin for sinusitis. ? ?Please use Flonase for allergies. ? ?Please continue to take other medications as prescribed. ?

## 2021-05-27 NOTE — Assessment & Plan Note (Signed)
Not in remission ?On Revlimid ?Follows up with Oncology ?Has chronic pain, on Tramadol - refilled for now ?

## 2021-05-27 NOTE — Assessment & Plan Note (Signed)
Has underlying MM ?Gets routine BMP check during Oncology visits ?Avoid nephrotoxic agents ?

## 2021-05-27 NOTE — Assessment & Plan Note (Signed)
BP Readings from Last 1 Encounters:  ?05/27/21 124/82  ? ?Well-controlled for her age, on amlodipine, Metoprolol and Losartan-HCTZ ?Counseled for compliance with the medications ?Advised low salt diet ?

## 2021-05-27 NOTE — Assessment & Plan Note (Signed)
Lab Results  ?Component Value Date  ? HGBA1C 6.7 (H) 09/24/2020  ? ?On metformin and glipizide ?Advised to follow diabetic diet ?On statin ?Diabetic eye exam: Advised to follow up with Ophthalmology for diabetic eye exam ?On gabapentin to 300 mg twice daily for DM neuropathy ?

## 2021-05-27 NOTE — Progress Notes (Signed)
? ?Established Patient Office Visit ? ?Subjective:  ?Patient ID: Kim Mills, female    DOB: 1940/06/05  Age: 81 y.o. MRN: [id number] ? ?CC:  ?Chief Complaint  ?Patient presents with  ? Follow-up  ?  4 month follow up pt has chest congestion started 05-18-21 also has frequent urination not sure about a UTI   ? ? ?HPI ?Kim Mills is a 81 y.o. female with past medical history of multiple myeloma, HTN, CAD, DM with neuropathy, CKD stage 3, hypothyroidism, overactive bladder and chronic pain who presents for f/u of her chronic medical conditions. ? ?She complains of nasal congestion and cough for the last 10 days.  She also reports postnasal drip, but denies any fever, chills, dyspnea or wheezing currently. ? ?She also reports urinary frequency, but denies any dysuria or hematuria currently.  She has had UTIs in the past. ?  ?HTN: Her BP is stable considering her age.Takes medications regularly. Patient denies headache, dizziness, chest pain, dyspnea or palpitations. ?  ?DM: She takes Metformin and Glipizide. Denies any polyuria or polyphagia. Last HbA1C was 6.7. ? ?She complains of chronic, diffuse pain of joints.  She has been getting tramadol for cancer related pain and requests a refill of it.  She follows up with Oncology for h/o MM. ? ?Past Medical History:  ?Diagnosis Date  ? Arthritis   ? Cancer [facility])   ? Cataract   ? Chest pain   ? Associated with weakness and fatigue  ? Diabetes mellitus   ? A1c of 7.4 in 08/2010  ? GERD (gastroesophageal reflux disease)   ? Glaucoma   ? Hyperlipidemia   ? Lipid profile in 08/2010:190, 121, 48, 118; normal CBC and CMet  ? Hypertension   ? Lipid profile in 08/2010:190, 121, 48, 118; normal CBC and CMet  ? Hypothyroidism   ? Hypothyroidism   ? Obesity   ? ? ?Past Surgical History:  ?Procedure Laterality Date  ? CHOLECYSTECTOMY    ? COLONOSCOPY N/A 04/01/2014  ? RMR: Melanosis coli. colonic polyps removed as described above.   ? CYSTOSCOPY W/ URETERAL STENT PLACEMENT Right  09/13/2019  ? Procedure: CYSTOSCOPY WITH RIGHT  RETROGRADE PYELOGRAM; BLADDER BIOPSY;  Surgeon: Cleon Gustin, MD;  Location: [facility];  Service: Urology;  Laterality: Right;  ? LEFT HEART CATH AND CORONARY ANGIOGRAPHY N/A 10/07/2017  ? Procedure: LEFT HEART CATH AND CORONARY ANGIOGRAPHY;  Surgeon: Troy Sine, MD;  Location: [facility];  Service: Cardiovascular;  Laterality: N/A;  ? THYROIDECTOMY, PARTIAL    ? ? ?Family History  ?Problem Relation Age of Onset  ? Anemia Father   ? Arthritis Mother   ? Stroke Brother   ? Cancer Sister   ?     unknown kind  ? Diabetes Sister   ? Dementia Sister   ? Cancer Niece   ?     Breast  ? Cancer Brother   ? Healthy Son   ? Healthy Son   ? Healthy Son   ? Healthy Daughter   ? Healthy Daughter   ? Healthy Daughter   ? Sarcoidosis Daughter   ? ? ?Social History  ? ?Socioeconomic History  ? Marital status: Widowed  ?  Spouse name: Not on file  ? Number of children: 7  ? Years of education: Not on file  ? Highest education level: Not on file  ?Occupational History  ?  Employer: RETIRED  ?Tobacco Use  ? Smoking status: Never  ?  Passive exposure: Yes  ? Smokeless tobacco: Never  ?Vaping Use  ? Vaping Use: Never used  ?Substance and Sexual Activity  ? Alcohol use: Never  ? Drug use: Never  ? Sexual activity: Not Currently  ?Other Topics Concern  ? Not on file  ?Social History Narrative  ? Daughter, Corky Sing, lives with and assists patient.   ? ?Social Determinants of Health  ? ?Financial Resource Strain: Low Risk   ? Difficulty of Paying Living Expenses: Not hard at all  ?Food Insecurity: No Food Insecurity  ? Worried About Charity fundraiser in the Last Year: Never true  ? Ran Out of Food in the Last Year: Never true  ?Transportation Needs: No Transportation Needs  ? Lack of Transportation (Medical): No  ? Lack of Transportation (Non-Medical): No  ?Physical Activity: Sufficiently Active  ? Days of Exercise per Week: 5 days  ? Minutes of Exercise per Session: 30 min   ?Stress: No Stress Concern Present  ? Feeling of Stress : Not at all  ?Social Connections: Moderately Integrated  ? Frequency of Communication with Friends and Family: More than three times a week  ? Frequency of Social Gatherings with Friends and Family: More than three times a week  ? Attends Religious Services: 1 to 4 times per year  ? Active Member of Clubs or Organizations: Yes  ? Attends Archivist Meetings: 1 to 4 times per year  ? Marital Status: Widowed  ?Intimate Partner Violence: Not At Risk  ? Fear of Current or Ex-Partner: No  ? Emotionally Abused: No  ? Physically Abused: No  ? Sexually Abused: No  ? ? ?Outpatient Medications Prior to Visit  ?Medication Sig Dispense Refill  ? acyclovir (ZOVIRAX) 400 MG tablet TAKE ONE TABLET (400MG TOTAL) BY MOUTH TWICE DAILY 60 tablet 6  ? albuterol (VENTOLIN HFA) 108 (90 Base) MCG/ACT inhaler Inhale 2 puffs into the lungs every 6 (six) hours as needed for wheezing or shortness of breath. 8 g 0  ? ASPIRIN 81 PO Take 1 tablet by mouth daily.    ? blood glucose meter kit and supplies Dispense based on patient and insurance preference. Use up to four times daily as directed. (FOR ICD-10 E10.9, E11.9). 1 each 0  ? Blood Glucose Monitoring Suppl (BLOOD GLUCOSE SYSTEM PAK) KIT Please dispense based on patient and insurance preference. Use as directed to monitor FSBS 2x daily. Dx: E11.9. 1 each 1  ? Calcium 500 MG tablet Take 600 mg by mouth 2 (two) times daily.    ? gabapentin (NEURONTIN) 300 MG capsule Take 1 capsule (300 mg total) by mouth 2 (two) times daily. Dose/frequency change 180 capsule 3  ? glipiZIDE (GLUCOTROL) 5 MG tablet TAKE ONE TABLET (5MG TOTAL) BY MOUTH TWOTIMES DAILY (Patient taking differently: Take 5 mg by mouth 2 (two) times daily before a meal.) 180 tablet 3  ? Glucose Blood (BLOOD GLUCOSE TEST STRIPS) STRP Please dispense based on patient and insurance preference. Use as directed to monitor FSBS 2x daily. Dx: E11.9. 100 each 11  ?  guaiFENesin-dextromethorphan (ROBITUSSIN DM) 100-10 MG/5ML syrup Take 5 mLs by mouth every 4 (four) hours as needed for cough. 118 mL 0  ? HM LORATADINE 10 MG tablet TAKE ONE TABLET (10MG TOTAL) BY MOUTH DAILY (Patient taking differently: Take 10 mg by mouth daily as needed for allergies.) 30 tablet 0  ? Lancets MISC Please dispense based on patient and insurance preference. Use as directed to monitor FSBS 2x daily.  Dx: E11.9. 100 each 11  ? lenalidomide (REVLIMID) 10 MG capsule Take 1 capsule (10 mg total) by mouth daily. 14 days on, 7 days off 14 capsule 0  ? magic mouthwash w/lidocaine SOLN Take 5 mLs by mouth 4 (four) times daily. 450 mL 0  ? magnesium oxide (MAG-OX) 400 (240 Mg) MG tablet TAKE ONE TABLET (400MG TOTAL) BY MOUTH TWO TIMES DAILY (Patient taking differently: Take 400 mg by mouth 2 (two) times daily.) 120 tablet 1  ? meclizine (ANTIVERT) 25 MG tablet Take 25 mg by mouth every 6 (six) hours as needed for dizziness.    ? metFORMIN (GLUCOPHAGE) 1000 MG tablet TAKE ONE TABLET BY MOUTH TWICE A DAY 60 tablet 3  ? metoprolol tartrate (LOPRESSOR) 25 MG tablet TAKE ONE-HALF TABLET (12.5MG TOTAL) BY MOUTH TWO TIMES DAILY (Patient taking differently: Take 12.5 mg by mouth 2 (two) times daily.) 90 tablet 3  ? omeprazole (PRILOSEC) 40 MG capsule TAKE ONE CAPSULE BY MOUTH DAILY 90 capsule 3  ? polyethylene glycol (MIRALAX / GLYCOLAX) 17 g packet Take 17 g by mouth daily. 14 each 0  ? simvastatin (ZOCOR) 40 MG tablet TAKE ONE TABLET (40MG TOTAL) BY MOUTH BEDTIME (Patient taking differently: Take 40 mg by mouth at bedtime.) 90 tablet 3  ? SYNTHROID 137 MCG tablet TAKE ONE TABLET (137MCG TOTAL) BY MOUTH DAILY BEFORE BREAKFAST 90 tablet 1  ? zinc oxide (MEIJER ZINC OXIDE) 20 % ointment Apply 1 application topically as needed for irritation. 56.7 g 0  ? fluticasone (FLONASE) 50 MCG/ACT nasal spray USE TWO SPRAYS IN BOTH NOSTRILS DAILY (Patient taking differently: Place 2 sprays into both nostrils daily.) 16 g 6  ?  oxyCODONE 10 MG TABS Take 1 tablet (10 mg total) by mouth every 6 (six) hours as needed for severe pain. 60 tablet 0  ? potassium chloride (KLOR-CON) 10 MEQ tablet Take 2 tablets (20 mEq total) by mouth daily. 60 tablet

## 2021-05-28 DIAGNOSIS — I2510 Atherosclerotic heart disease of native coronary artery without angina pectoris: Secondary | ICD-10-CM | POA: Diagnosis not present

## 2021-05-28 DIAGNOSIS — N183 Chronic kidney disease, stage 3 unspecified: Secondary | ICD-10-CM | POA: Diagnosis not present

## 2021-05-28 DIAGNOSIS — D631 Anemia in chronic kidney disease: Secondary | ICD-10-CM | POA: Diagnosis not present

## 2021-05-28 DIAGNOSIS — E1122 Type 2 diabetes mellitus with diabetic chronic kidney disease: Secondary | ICD-10-CM | POA: Diagnosis not present

## 2021-05-28 DIAGNOSIS — C9000 Multiple myeloma not having achieved remission: Secondary | ICD-10-CM | POA: Diagnosis not present

## 2021-05-28 DIAGNOSIS — I129 Hypertensive chronic kidney disease with stage 1 through stage 4 chronic kidney disease, or unspecified chronic kidney disease: Secondary | ICD-10-CM | POA: Diagnosis not present

## 2021-05-28 LAB — KAPPA/LAMBDA LIGHT CHAINS
Kappa free light chain: 27.6 mg/L — ABNORMAL HIGH (ref 3.3–19.4)
Kappa, lambda light chain ratio: 1.73 — ABNORMAL HIGH (ref 0.26–1.65)
Lambda free light chains: 16 mg/L (ref 5.7–26.3)

## 2021-05-29 DIAGNOSIS — I129 Hypertensive chronic kidney disease with stage 1 through stage 4 chronic kidney disease, or unspecified chronic kidney disease: Secondary | ICD-10-CM | POA: Diagnosis not present

## 2021-05-29 DIAGNOSIS — E1122 Type 2 diabetes mellitus with diabetic chronic kidney disease: Secondary | ICD-10-CM | POA: Diagnosis not present

## 2021-05-29 DIAGNOSIS — C9000 Multiple myeloma not having achieved remission: Secondary | ICD-10-CM | POA: Diagnosis not present

## 2021-05-29 DIAGNOSIS — I2510 Atherosclerotic heart disease of native coronary artery without angina pectoris: Secondary | ICD-10-CM | POA: Diagnosis not present

## 2021-05-29 DIAGNOSIS — N183 Chronic kidney disease, stage 3 unspecified: Secondary | ICD-10-CM | POA: Diagnosis not present

## 2021-05-29 DIAGNOSIS — D631 Anemia in chronic kidney disease: Secondary | ICD-10-CM | POA: Diagnosis not present

## 2021-05-29 LAB — PROTEIN ELECTROPHORESIS, SERUM
A/G Ratio: 1.2 (ref 0.7–1.7)
Albumin ELP: 3.6 g/dL (ref 2.9–4.4)
Alpha-1-Globulin: 0.2 g/dL (ref 0.0–0.4)
Alpha-2-Globulin: 0.7 g/dL (ref 0.4–1.0)
Beta Globulin: 1.3 g/dL (ref 0.7–1.3)
Gamma Globulin: 0.9 g/dL (ref 0.4–1.8)
Globulin, Total: 3.1 g/dL (ref 2.2–3.9)
M-Spike, %: 0.5 g/dL — ABNORMAL HIGH
Total Protein ELP: 6.7 g/dL (ref 6.0–8.5)

## 2021-05-29 LAB — IMMUNOFIXATION ELECTROPHORESIS
IgA: 383 mg/dL (ref 64–422)
IgG (Immunoglobin G), Serum: 1101 mg/dL (ref 586–1602)
IgM (Immunoglobulin M), Srm: 27 mg/dL (ref 26–217)
Total Protein ELP: 7 g/dL (ref 6.0–8.5)

## 2021-06-01 DIAGNOSIS — Z20822 Contact with and (suspected) exposure to covid-19: Secondary | ICD-10-CM | POA: Diagnosis not present

## 2021-06-02 DIAGNOSIS — E1122 Type 2 diabetes mellitus with diabetic chronic kidney disease: Secondary | ICD-10-CM | POA: Diagnosis not present

## 2021-06-02 DIAGNOSIS — N183 Chronic kidney disease, stage 3 unspecified: Secondary | ICD-10-CM | POA: Diagnosis not present

## 2021-06-02 DIAGNOSIS — C9000 Multiple myeloma not having achieved remission: Secondary | ICD-10-CM | POA: Diagnosis not present

## 2021-06-02 DIAGNOSIS — I129 Hypertensive chronic kidney disease with stage 1 through stage 4 chronic kidney disease, or unspecified chronic kidney disease: Secondary | ICD-10-CM | POA: Diagnosis not present

## 2021-06-02 DIAGNOSIS — I2510 Atherosclerotic heart disease of native coronary artery without angina pectoris: Secondary | ICD-10-CM | POA: Diagnosis not present

## 2021-06-02 DIAGNOSIS — D631 Anemia in chronic kidney disease: Secondary | ICD-10-CM | POA: Diagnosis not present

## 2021-06-03 ENCOUNTER — Inpatient Hospital Stay (HOSPITAL_COMMUNITY): Payer: Medicare Other

## 2021-06-03 ENCOUNTER — Other Ambulatory Visit (HOSPITAL_COMMUNITY): Payer: Self-pay

## 2021-06-03 ENCOUNTER — Inpatient Hospital Stay (HOSPITAL_BASED_OUTPATIENT_CLINIC_OR_DEPARTMENT_OTHER): Payer: Medicare Other | Admitting: Hematology

## 2021-06-03 VITALS — BP 163/58 | HR 55 | Temp 98.1°F | Resp 18 | Wt 173.9 lb

## 2021-06-03 DIAGNOSIS — R200 Anesthesia of skin: Secondary | ICD-10-CM | POA: Diagnosis not present

## 2021-06-03 DIAGNOSIS — C9000 Multiple myeloma not having achieved remission: Secondary | ICD-10-CM

## 2021-06-03 DIAGNOSIS — Z79899 Other long term (current) drug therapy: Secondary | ICD-10-CM | POA: Diagnosis not present

## 2021-06-03 DIAGNOSIS — D649 Anemia, unspecified: Secondary | ICD-10-CM | POA: Diagnosis not present

## 2021-06-03 DIAGNOSIS — M25562 Pain in left knee: Secondary | ICD-10-CM | POA: Diagnosis not present

## 2021-06-03 DIAGNOSIS — M25561 Pain in right knee: Secondary | ICD-10-CM | POA: Diagnosis not present

## 2021-06-03 MED ORDER — DENOSUMAB 120 MG/1.7ML ~~LOC~~ SOLN
120.0000 mg | Freq: Once | SUBCUTANEOUS | Status: AC
  Administered 2021-06-03: 120 mg via SUBCUTANEOUS
  Filled 2021-06-03: qty 1.7

## 2021-06-03 MED ORDER — LENALIDOMIDE 10 MG PO CAPS: 10.0000 mg | ORAL_CAPSULE | Freq: Every day | ORAL | 0 refills | Status: DC

## 2021-06-03 NOTE — Progress Notes (Signed)
Patient tolerated Xgeva injection with no complaints voiced.  Site clean and dry with no bruising or swelling noted.  No complaints of pain.  Discharged with vital signs stable and no signs or symptoms of distress noted.  

## 2021-06-03 NOTE — Progress Notes (Signed)
? ?[facility] ?[address] ?[place], [place] [address] ? ? ?CLINIC:  ?Medical Oncology/Hematology ? ?PCP:  ?Lindell Spar, MD ?[address] / [place] [address]  ?[phone] ? ?REASON FOR VISIT:  ?Follow-up for multiple myeloma & normocytic anemia ? ?PRIOR THERAPY:  ?Velcade x 9 cycles from 10/29/2019 to 05/02/2020 ?Revlimid 2/3 weeks ? ?CURRENT THERAPY: surveillance ? ?INTERVAL HISTORY:  ?[name], a 81 y.o. female, returns for routine follow-up for her multiple myeloma & normocytic anemia. Kim Mills was last seen on 04/14/2021. ? ?Today she reports feeling well. She reports continued bilateral knee pain for which she takes 4 tablets of tramadol daily. She denies new pains. She reports she has a cold last week for which she was given Z-pak. She walks with a walker at home and denies falls. The numbness in her feet is stable. She is eating good.  ? ?REVIEW OF SYSTEMS:  ?Review of Systems  ?Constitutional:  Negative for appetite change and fatigue.  ?Neurological:  Positive for dizziness and headaches.  ?Psychiatric/Behavioral:  Positive for sleep disturbance.   ?All other systems reviewed and are negative. ? ?PAST MEDICAL/SURGICAL HISTORY:  ?Past Medical History:  ?Diagnosis Date  ? Arthritis   ? Cancer [facility])   ? Cataract   ? Chest pain   ? Associated with weakness and fatigue  ? Diabetes mellitus   ? A1c of 7.4 in 08/2010  ? GERD (gastroesophageal reflux disease)   ? Glaucoma   ? Hyperlipidemia   ? Lipid profile in 08/2010:190, 121, 48, 118; normal CBC and CMet  ? Hypertension   ? Lipid profile in 08/2010:190, 121, 48, 118; normal CBC and CMet  ? Hypothyroidism   ? Hypothyroidism   ? Obesity   ? ?Past Surgical History:  ?Procedure Laterality Date  ? CHOLECYSTECTOMY    ? COLONOSCOPY N/A 04/01/2014  ? RMR: Melanosis coli. colonic polyps removed as described above.   ? CYSTOSCOPY W/ URETERAL STENT PLACEMENT Right 09/13/2019  ? Procedure: CYSTOSCOPY WITH RIGHT  RETROGRADE PYELOGRAM; BLADDER  BIOPSY;  Surgeon: Cleon Gustin, MD;  Location: [facility];  Service: Urology;  Laterality: Right;  ? LEFT HEART CATH AND CORONARY ANGIOGRAPHY N/A 10/07/2017  ? Procedure: LEFT HEART CATH AND CORONARY ANGIOGRAPHY;  Surgeon: Troy Sine, MD;  Location: [facility];  Service: Cardiovascular;  Laterality: N/A;  ? THYROIDECTOMY, PARTIAL    ? ? ?SOCIAL HISTORY:  ?Social History  ? ?Socioeconomic History  ? Marital status: Widowed  ?  Spouse name: Not on file  ? Number of children: 7  ? Years of education: Not on file  ? Highest education level: Not on file  ?Occupational History  ?  Employer: RETIRED  ?Tobacco Use  ? Smoking status: Never  ?  Passive exposure: Yes  ? Smokeless tobacco: Never  ?Vaping Use  ? Vaping Use: Never used  ?Substance and Sexual Activity  ? Alcohol use: Never  ? Drug use: Never  ? Sexual activity: Not Currently  ?Other Topics Concern  ? Not on file  ?Social History Narrative  ? Daughter, Corky Sing, lives with and assists patient.   ? ?Social Determinants of Health  ? ?Financial Resource Strain: Low Risk   ? Difficulty of Paying Living Expenses: Not hard at all  ?Food Insecurity: No Food Insecurity  ? Worried About Charity fundraiser in the Last Year: Never true  ? Ran Out of Food in the Last Year: Never true  ?Transportation Needs:  No Transportation Needs  ? Lack of Transportation (Medical): No  ? Lack of Transportation (Non-Medical): No  ?Physical Activity: Sufficiently Active  ? Days of Exercise per Week: 5 days  ? Minutes of Exercise per Session: 30 min  ?Stress: No Stress Concern Present  ? Feeling of Stress : Not at all  ?Social Connections: Moderately Integrated  ? Frequency of Communication with Friends and Family: More than three times a week  ? Frequency of Social Gatherings with Friends and Family: More than three times a week  ? Attends Religious Services: 1 to 4 times per year  ? Active Member of Clubs or Organizations: Yes  ? Attends Archivist Meetings: 1 to 4 times  per year  ? Marital Status: Widowed  ?Intimate Partner Violence: Not At Risk  ? Fear of Current or Ex-Partner: No  ? Emotionally Abused: No  ? Physically Abused: No  ? Sexually Abused: No  ? ? ?FAMILY HISTORY:  ?Family History  ?Problem Relation Age of Onset  ? Anemia Father   ? Arthritis Mother   ? Stroke Brother   ? Cancer Sister   ?     unknown kind  ? Diabetes Sister   ? Dementia Sister   ? Cancer Niece   ?     Breast  ? Cancer Brother   ? Healthy Son   ? Healthy Son   ? Healthy Son   ? Healthy Daughter   ? Healthy Daughter   ? Healthy Daughter   ? Sarcoidosis Daughter   ? ? ?CURRENT MEDICATIONS:  ?Current Outpatient Medications  ?Medication Sig Dispense Refill  ? acyclovir (ZOVIRAX) 400 MG tablet TAKE ONE TABLET (400MG TOTAL) BY MOUTH TWICE DAILY 60 tablet 6  ? albuterol (VENTOLIN HFA) 108 (90 Base) MCG/ACT inhaler Inhale 2 puffs into the lungs every 6 (six) hours as needed for wheezing or shortness of breath. 8 g 0  ? ASPIRIN 81 PO Take 1 tablet by mouth daily.    ? blood glucose meter kit and supplies Dispense based on patient and insurance preference. Use up to four times daily as directed. (FOR ICD-10 E10.9, E11.9). 1 each 0  ? Blood Glucose Monitoring Suppl (BLOOD GLUCOSE SYSTEM PAK) KIT Please dispense based on patient and insurance preference. Use as directed to monitor FSBS 2x daily. Dx: E11.9. 1 each 1  ? Calcium 500 MG tablet Take 600 mg by mouth 2 (two) times daily.    ? fluticasone (FLONASE) 50 MCG/ACT nasal spray Place 2 sprays into both nostrils daily. 16 g 6  ? gabapentin (NEURONTIN) 300 MG capsule Take 1 capsule (300 mg total) by mouth 2 (two) times daily. Dose/frequency change 180 capsule 3  ? glipiZIDE (GLUCOTROL) 5 MG tablet TAKE ONE TABLET (5MG TOTAL) BY MOUTH TWOTIMES DAILY (Patient taking differently: Take 5 mg by mouth 2 (two) times daily before a meal.) 180 tablet 3  ? Glucose Blood (BLOOD GLUCOSE TEST STRIPS) STRP Please dispense based on patient and insurance preference. Use as  directed to monitor FSBS 2x daily. Dx: E11.9. 100 each 11  ? guaiFENesin-dextromethorphan (ROBITUSSIN DM) 100-10 MG/5ML syrup Take 5 mLs by mouth every 4 (four) hours as needed for cough. 118 mL 0  ? HM LORATADINE 10 MG tablet TAKE ONE TABLET (10MG TOTAL) BY MOUTH DAILY (Patient taking differently: Take 10 mg by mouth daily as needed for allergies.) 30 tablet 0  ? Lancets MISC Please dispense based on patient and insurance preference. Use as directed to monitor FSBS  2x daily. Dx: E11.9. 100 each 11  ? lenalidomide (REVLIMID) 10 MG capsule Take 1 capsule (10 mg total) by mouth daily. 14 days on, 7 days off 14 capsule 0  ? magic mouthwash w/lidocaine SOLN Take 5 mLs by mouth 4 (four) times daily. 450 mL 0  ? magnesium oxide (MAG-OX) 400 (240 Mg) MG tablet TAKE ONE TABLET (400MG TOTAL) BY MOUTH TWO TIMES DAILY (Patient taking differently: Take 400 mg by mouth 2 (two) times daily.) 120 tablet 1  ? meclizine (ANTIVERT) 25 MG tablet Take 25 mg by mouth every 6 (six) hours as needed for dizziness.    ? metFORMIN (GLUCOPHAGE) 1000 MG tablet TAKE ONE TABLET BY MOUTH TWICE A DAY 60 tablet 3  ? metoprolol tartrate (LOPRESSOR) 25 MG tablet TAKE ONE-HALF TABLET (12.5MG TOTAL) BY MOUTH TWO TIMES DAILY (Patient taking differently: Take 12.5 mg by mouth 2 (two) times daily.) 90 tablet 3  ? omeprazole (PRILOSEC) 40 MG capsule TAKE ONE CAPSULE BY MOUTH DAILY 90 capsule 3  ? polyethylene glycol (MIRALAX / GLYCOLAX) 17 g packet Take 17 g by mouth daily. 14 each 0  ? potassium chloride (KLOR-CON M) 10 MEQ tablet Take 2 tablets (20 mEq total) by mouth daily. 60 tablet 2  ? simvastatin (ZOCOR) 40 MG tablet TAKE ONE TABLET (40MG TOTAL) BY MOUTH BEDTIME (Patient taking differently: Take 40 mg by mouth at bedtime.) 90 tablet 3  ? SYNTHROID 137 MCG tablet TAKE ONE TABLET (137MCG TOTAL) BY MOUTH DAILY BEFORE BREAKFAST 90 tablet 1  ? traMADol (ULTRAM) 50 MG tablet Take 1 tablet (50 mg total) by mouth every 6 (six) hours as needed. 30 tablet 0   ? zinc oxide (MEIJER ZINC OXIDE) 20 % ointment Apply 1 application topically as needed for irritation. 56.7 g 0  ? ?No current facility-administered medications for this visit.  ? ? ?ALLERGIES:  ?No Known All

## 2021-06-03 NOTE — Patient Instructions (Addendum)
[facility] at [facility] ?Discharge Instructions ? ? ?You were seen and examined today by Dr. Delton Coombes. ? ?He reviewed the results of your lab work. Your m-spike is continuing to rise. Dr. Raliegh Ip recommends restarting on the Revlimid 10 mg 2 weeks on and 1 week off.  ? ?We will proceed with your Xgeva injection today.  ? ?Return as scheduled.  ? ? ?Thank you for choosing [place] at [facility] to provide your oncology and hematology care.  To afford each patient quality time with our provider, please arrive at least 15 minutes before your scheduled appointment time.  ? ?If you have a lab appointment with the [facility] please come in thru the Main Entrance and check in at the main information desk. ? ?You need to re-schedule your appointment should you arrive 10 or more minutes late.  We strive to give you quality time with our providers, and arriving late affects you and other patients whose appointments are after yours.  Also, if you no show three or more times for appointments you may be dismissed from the clinic at the providers discretion.     ?Again, thank you for choosing [facility].  Our hope is that these requests will decrease the amount of time that you wait before being seen by our physicians.       ?_____________________________________________________________ ? ?Should you have questions after your visit to [facility], please contact our office at [phone] and follow the prompts.  Our office hours are 8:00 a.m. and 4:30 p.m. Monday - Friday.  Please note that voicemails left after 4:00 p.m. may not be returned until the following business day.  We are closed weekends and major holidays.  You do have access to a nurse 24-7, just call the main number to the clinic [phone] and do not press any options, hold on the line and a nurse will answer the phone.   ? ?For prescription refill requests, have your pharmacy contact  our office and allow 72 hours.   ? ?Due to Covid, you will need to wear a mask upon entering the hospital. If you do not have a mask, a mask will be given to you at the Main Entrance upon arrival. For doctor visits, patients may have 1 support person age 35 or older with them. For treatment visits, patients can not have anyone with them due to social distancing guidelines and our immunocompromised population.  ? ?   ?

## 2021-06-03 NOTE — Telephone Encounter (Signed)
Order sent for Revlimid '10mg'$  14 days on, 7 days off per Dr. Delton Coombes verbal order ?

## 2021-06-03 NOTE — Patient Instructions (Signed)
[facility]  Discharge Instructions: Thank you for choosing [facility] to provide your oncology and hematology care.  If you have a lab appointment with the Cancer Center, please come in thru the Main Entrance and check in at the main information desk.  Wear comfortable clothing and clothing appropriate for easy access to any Portacath or PICC line.   We strive to give you quality time with your provider. You may need to reschedule your appointment if you arrive late (15 or more minutes).  Arriving late affects you and other patients whose appointments are after yours.  Also, if you miss three or more appointments without notifying the office, you may be dismissed from the clinic at the provider's discretion.      For prescription refill requests, have your pharmacy contact our office and allow 72 hours for refills to be completed.        To help prevent nausea and vomiting after your treatment, we encourage you to take your nausea medication as directed.  BELOW ARE SYMPTOMS THAT SHOULD BE REPORTED IMMEDIATELY: *FEVER GREATER THAN 100.4 F (38 C) OR HIGHER *CHILLS OR SWEATING *NAUSEA AND VOMITING THAT IS NOT CONTROLLED WITH YOUR NAUSEA MEDICATION *UNUSUAL SHORTNESS OF BREATH *UNUSUAL BRUISING OR BLEEDING *URINARY PROBLEMS (pain or burning when urinating, or frequent urination) *BOWEL PROBLEMS (unusual diarrhea, constipation, pain near the anus) TENDERNESS IN MOUTH AND THROAT WITH OR WITHOUT PRESENCE OF ULCERS (sore throat, sores in mouth, or a toothache) UNUSUAL RASH, SWELLING OR PAIN  UNUSUAL VAGINAL DISCHARGE OR ITCHING   Items with * indicate a potential emergency and should be followed up as soon as possible or go to the Emergency Department if any problems should occur.  Please show the CHEMOTHERAPY ALERT CARD or IMMUNOTHERAPY ALERT CARD at check-in to the Emergency Department and triage nurse.  Should you have questions after your visit or need to cancel  or reschedule your appointment, please contact [facility] [phone]  and follow the prompts.  Office hours are 8:00 a.m. to 4:30 p.m. Monday - Friday. Please note that voicemails left after 4:00 p.m. may not be returned until the following business day.  We are closed weekends and major holidays. You have access to a nurse at all times for urgent questions. Please call the main number to the clinic [phone] and follow the prompts.  For any non-urgent questions, you may also contact your provider using MyChart. We now offer e-Visits for anyone 18 and older to request care online for non-urgent symptoms. For details visit [email or web address].   Also download the MyChart app! Go to the app store, search "MyChart", open the app, select [facility], and log in with your MyChart username and password.  Due to Covid, a mask is required upon entering the hospital/clinic. If you do not have a mask, one will be given to you upon arrival. For doctor visits, patients may have 1 support person aged 18 or older with them. For treatment visits, patients cannot have anyone with them due to current Covid guidelines and our immunocompromised population.  

## 2021-06-04 DIAGNOSIS — E1122 Type 2 diabetes mellitus with diabetic chronic kidney disease: Secondary | ICD-10-CM | POA: Diagnosis not present

## 2021-06-04 DIAGNOSIS — C9000 Multiple myeloma not having achieved remission: Secondary | ICD-10-CM | POA: Diagnosis not present

## 2021-06-04 DIAGNOSIS — I129 Hypertensive chronic kidney disease with stage 1 through stage 4 chronic kidney disease, or unspecified chronic kidney disease: Secondary | ICD-10-CM | POA: Diagnosis not present

## 2021-06-04 DIAGNOSIS — I2510 Atherosclerotic heart disease of native coronary artery without angina pectoris: Secondary | ICD-10-CM | POA: Diagnosis not present

## 2021-06-04 DIAGNOSIS — D631 Anemia in chronic kidney disease: Secondary | ICD-10-CM | POA: Diagnosis not present

## 2021-06-04 DIAGNOSIS — N183 Chronic kidney disease, stage 3 unspecified: Secondary | ICD-10-CM | POA: Diagnosis not present

## 2021-06-09 ENCOUNTER — Other Ambulatory Visit (HOSPITAL_COMMUNITY): Payer: Self-pay | Admitting: *Deleted

## 2021-06-09 DIAGNOSIS — C9000 Multiple myeloma not having achieved remission: Secondary | ICD-10-CM

## 2021-06-09 DIAGNOSIS — N183 Chronic kidney disease, stage 3 unspecified: Secondary | ICD-10-CM | POA: Diagnosis not present

## 2021-06-09 DIAGNOSIS — D631 Anemia in chronic kidney disease: Secondary | ICD-10-CM | POA: Diagnosis not present

## 2021-06-09 DIAGNOSIS — E1122 Type 2 diabetes mellitus with diabetic chronic kidney disease: Secondary | ICD-10-CM | POA: Diagnosis not present

## 2021-06-09 DIAGNOSIS — M170 Bilateral primary osteoarthritis of knee: Secondary | ICD-10-CM

## 2021-06-09 DIAGNOSIS — I2510 Atherosclerotic heart disease of native coronary artery without angina pectoris: Secondary | ICD-10-CM | POA: Diagnosis not present

## 2021-06-09 DIAGNOSIS — I129 Hypertensive chronic kidney disease with stage 1 through stage 4 chronic kidney disease, or unspecified chronic kidney disease: Secondary | ICD-10-CM | POA: Diagnosis not present

## 2021-06-09 MED ORDER — TRAMADOL HCL 50 MG PO TABS: 50.0000 mg | ORAL_TABLET | Freq: Four times a day (QID) | ORAL | 0 refills | Status: DC | PRN

## 2021-06-10 ENCOUNTER — Other Ambulatory Visit (HOSPITAL_COMMUNITY): Payer: Self-pay | Admitting: *Deleted

## 2021-06-10 DIAGNOSIS — N183 Chronic kidney disease, stage 3 unspecified: Secondary | ICD-10-CM | POA: Diagnosis not present

## 2021-06-10 DIAGNOSIS — C9000 Multiple myeloma not having achieved remission: Secondary | ICD-10-CM

## 2021-06-10 DIAGNOSIS — E1122 Type 2 diabetes mellitus with diabetic chronic kidney disease: Secondary | ICD-10-CM | POA: Diagnosis not present

## 2021-06-10 DIAGNOSIS — I2510 Atherosclerotic heart disease of native coronary artery without angina pectoris: Secondary | ICD-10-CM | POA: Diagnosis not present

## 2021-06-10 DIAGNOSIS — M170 Bilateral primary osteoarthritis of knee: Secondary | ICD-10-CM

## 2021-06-10 DIAGNOSIS — I129 Hypertensive chronic kidney disease with stage 1 through stage 4 chronic kidney disease, or unspecified chronic kidney disease: Secondary | ICD-10-CM | POA: Diagnosis not present

## 2021-06-10 DIAGNOSIS — D631 Anemia in chronic kidney disease: Secondary | ICD-10-CM | POA: Diagnosis not present

## 2021-06-11 DIAGNOSIS — I129 Hypertensive chronic kidney disease with stage 1 through stage 4 chronic kidney disease, or unspecified chronic kidney disease: Secondary | ICD-10-CM | POA: Diagnosis not present

## 2021-06-11 DIAGNOSIS — D631 Anemia in chronic kidney disease: Secondary | ICD-10-CM | POA: Diagnosis not present

## 2021-06-11 DIAGNOSIS — E1122 Type 2 diabetes mellitus with diabetic chronic kidney disease: Secondary | ICD-10-CM | POA: Diagnosis not present

## 2021-06-11 DIAGNOSIS — I2510 Atherosclerotic heart disease of native coronary artery without angina pectoris: Secondary | ICD-10-CM | POA: Diagnosis not present

## 2021-06-11 DIAGNOSIS — C9000 Multiple myeloma not having achieved remission: Secondary | ICD-10-CM | POA: Diagnosis not present

## 2021-06-11 DIAGNOSIS — N183 Chronic kidney disease, stage 3 unspecified: Secondary | ICD-10-CM | POA: Diagnosis not present

## 2021-06-12 DIAGNOSIS — C9000 Multiple myeloma not having achieved remission: Secondary | ICD-10-CM | POA: Diagnosis not present

## 2021-06-12 DIAGNOSIS — D631 Anemia in chronic kidney disease: Secondary | ICD-10-CM | POA: Diagnosis not present

## 2021-06-12 DIAGNOSIS — N183 Chronic kidney disease, stage 3 unspecified: Secondary | ICD-10-CM | POA: Diagnosis not present

## 2021-06-12 DIAGNOSIS — E1122 Type 2 diabetes mellitus with diabetic chronic kidney disease: Secondary | ICD-10-CM | POA: Diagnosis not present

## 2021-06-12 DIAGNOSIS — I2510 Atherosclerotic heart disease of native coronary artery without angina pectoris: Secondary | ICD-10-CM | POA: Diagnosis not present

## 2021-06-12 DIAGNOSIS — I129 Hypertensive chronic kidney disease with stage 1 through stage 4 chronic kidney disease, or unspecified chronic kidney disease: Secondary | ICD-10-CM | POA: Diagnosis not present

## 2021-06-16 DIAGNOSIS — N183 Chronic kidney disease, stage 3 unspecified: Secondary | ICD-10-CM | POA: Diagnosis not present

## 2021-06-16 DIAGNOSIS — D631 Anemia in chronic kidney disease: Secondary | ICD-10-CM | POA: Diagnosis not present

## 2021-06-16 DIAGNOSIS — I2510 Atherosclerotic heart disease of native coronary artery without angina pectoris: Secondary | ICD-10-CM | POA: Diagnosis not present

## 2021-06-16 DIAGNOSIS — E1122 Type 2 diabetes mellitus with diabetic chronic kidney disease: Secondary | ICD-10-CM | POA: Diagnosis not present

## 2021-06-16 DIAGNOSIS — I129 Hypertensive chronic kidney disease with stage 1 through stage 4 chronic kidney disease, or unspecified chronic kidney disease: Secondary | ICD-10-CM | POA: Diagnosis not present

## 2021-06-16 DIAGNOSIS — C9000 Multiple myeloma not having achieved remission: Secondary | ICD-10-CM | POA: Diagnosis not present

## 2021-06-17 DIAGNOSIS — E1122 Type 2 diabetes mellitus with diabetic chronic kidney disease: Secondary | ICD-10-CM | POA: Diagnosis not present

## 2021-06-17 DIAGNOSIS — N183 Chronic kidney disease, stage 3 unspecified: Secondary | ICD-10-CM | POA: Diagnosis not present

## 2021-06-17 DIAGNOSIS — C9000 Multiple myeloma not having achieved remission: Secondary | ICD-10-CM | POA: Diagnosis not present

## 2021-06-17 DIAGNOSIS — D631 Anemia in chronic kidney disease: Secondary | ICD-10-CM | POA: Diagnosis not present

## 2021-06-17 DIAGNOSIS — I2510 Atherosclerotic heart disease of native coronary artery without angina pectoris: Secondary | ICD-10-CM | POA: Diagnosis not present

## 2021-06-17 DIAGNOSIS — I129 Hypertensive chronic kidney disease with stage 1 through stage 4 chronic kidney disease, or unspecified chronic kidney disease: Secondary | ICD-10-CM | POA: Diagnosis not present

## 2021-06-18 ENCOUNTER — Other Ambulatory Visit (HOSPITAL_COMMUNITY): Payer: Self-pay

## 2021-06-18 MED ORDER — LENALIDOMIDE 10 MG PO CAPS: 10.0000 mg | ORAL_CAPSULE | Freq: Every day | ORAL | 0 refills | Status: DC

## 2021-06-18 NOTE — Telephone Encounter (Signed)
Chart reviewed. Revlimid refilled per last office note with Dr. Katragadda.  

## 2021-07-01 ENCOUNTER — Other Ambulatory Visit: Payer: Self-pay | Admitting: Internal Medicine

## 2021-07-07 ENCOUNTER — Other Ambulatory Visit (HOSPITAL_COMMUNITY): Payer: Self-pay

## 2021-07-07 MED ORDER — LENALIDOMIDE 10 MG PO CAPS: 10.0000 mg | ORAL_CAPSULE | Freq: Every day | ORAL | 0 refills | Status: DC

## 2021-07-07 NOTE — Telephone Encounter (Signed)
Chart reviewed. Revlimid refilled per last office note with Dr. Katragadda.  

## 2021-07-08 ENCOUNTER — Inpatient Hospital Stay (HOSPITAL_COMMUNITY): Payer: Medicare Other

## 2021-07-08 ENCOUNTER — Inpatient Hospital Stay (HOSPITAL_COMMUNITY): Payer: Medicare Other | Attending: Hematology | Admitting: Hematology

## 2021-07-08 ENCOUNTER — Other Ambulatory Visit (HOSPITAL_COMMUNITY): Payer: Self-pay

## 2021-07-08 VITALS — BP 163/75 | HR 58 | Temp 98.1°F | Resp 20 | Wt 181.5 lb

## 2021-07-08 DIAGNOSIS — C9000 Multiple myeloma not having achieved remission: Secondary | ICD-10-CM

## 2021-07-08 DIAGNOSIS — M170 Bilateral primary osteoarthritis of knee: Secondary | ICD-10-CM

## 2021-07-08 DIAGNOSIS — D649 Anemia, unspecified: Secondary | ICD-10-CM | POA: Diagnosis not present

## 2021-07-08 LAB — CBC WITH DIFFERENTIAL/PLATELET
Abs Immature Granulocytes: 0.14 10*3/uL — ABNORMAL HIGH (ref 0.00–0.07)
Basophils Absolute: 0.1 10*3/uL (ref 0.0–0.1)
Basophils Relative: 1 %
Eosinophils Absolute: 0.3 10*3/uL (ref 0.0–0.5)
Eosinophils Relative: 3 %
HCT: 36.1 % (ref 36.0–46.0)
Hemoglobin: 11.5 g/dL — ABNORMAL LOW (ref 12.0–15.0)
Immature Granulocytes: 2 %
Lymphocytes Relative: 35 %
Lymphs Abs: 2.7 10*3/uL (ref 0.7–4.0)
MCH: 30.9 pg (ref 26.0–34.0)
MCHC: 31.9 g/dL (ref 30.0–36.0)
MCV: 97 fL (ref 80.0–100.0)
Monocytes Absolute: 0.4 10*3/uL (ref 0.1–1.0)
Monocytes Relative: 5 %
Neutro Abs: 4.3 10*3/uL (ref 1.7–7.7)
Neutrophils Relative %: 54 %
Platelets: 225 10*3/uL (ref 150–400)
RBC: 3.72 MIL/uL — ABNORMAL LOW (ref 3.87–5.11)
RDW: 14.5 % (ref 11.5–15.5)
WBC: 7.9 10*3/uL (ref 4.0–10.5)
nRBC: 0 % (ref 0.0–0.2)

## 2021-07-08 LAB — COMPREHENSIVE METABOLIC PANEL
ALT: 28 U/L (ref 0–44)
AST: 25 U/L (ref 15–41)
Albumin: 3.6 g/dL (ref 3.5–5.0)
Alkaline Phosphatase: 56 U/L (ref 38–126)
Anion gap: 6 (ref 5–15)
BUN: 19 mg/dL (ref 8–23)
CO2: 24 mmol/L (ref 22–32)
Calcium: 8.2 mg/dL — ABNORMAL LOW (ref 8.9–10.3)
Chloride: 107 mmol/L (ref 98–111)
Creatinine, Ser: 1.12 mg/dL — ABNORMAL HIGH (ref 0.44–1.00)
GFR, Estimated: 50 mL/min — ABNORMAL LOW (ref >60)
Glucose, Bld: 191 mg/dL — ABNORMAL HIGH (ref 70–99)
Potassium: 3.9 mmol/L (ref 3.5–5.1)
Sodium: 137 mmol/L (ref 135–145)
Total Bilirubin: 0.8 mg/dL (ref 0.3–1.2)
Total Protein: 6.9 g/dL (ref 6.5–8.1)

## 2021-07-08 LAB — MAGNESIUM: Magnesium: 1.6 mg/dL — ABNORMAL LOW (ref 1.7–2.4)

## 2021-07-08 MED ORDER — TRAMADOL HCL 50 MG PO TABS: 50.0000 mg | ORAL_TABLET | Freq: Four times a day (QID) | ORAL | 0 refills | Status: DC | PRN

## 2021-07-08 MED ORDER — DENOSUMAB 120 MG/1.7ML ~~LOC~~ SOLN
120.0000 mg | Freq: Once | SUBCUTANEOUS | Status: AC
  Administered 2021-07-08: 120 mg via SUBCUTANEOUS
  Filled 2021-07-08: qty 1.7

## 2021-07-08 NOTE — Patient Instructions (Signed)
Kim Mills  Discharge Instructions: Thank you for choosing [facility] to provide your oncology and hematology care.  If you have a lab appointment with the [facility], please come in thru the Main Entrance and check in at the main information desk.  Wear comfortable clothing and clothing appropriate for easy access to any Portacath or PICC line.   We strive to give you quality time with your provider. You may need to reschedule your appointment if you arrive late (15 or more minutes).  Arriving late affects you and other patients whose appointments are after yours.  Also, if you miss three or more appointments without notifying the office, you may be dismissed from the clinic at the provider's discretion.      For prescription refill requests, have your pharmacy contact our office and allow 72 hours for refills to be completed.    Today you received Xgeva     BELOW ARE SYMPTOMS THAT SHOULD BE REPORTED IMMEDIATELY: *FEVER GREATER THAN 100.4 F (38 C) OR HIGHER *CHILLS OR SWEATING *NAUSEA AND VOMITING THAT IS NOT CONTROLLED WITH YOUR NAUSEA MEDICATION *UNUSUAL SHORTNESS OF BREATH *UNUSUAL BRUISING OR BLEEDING *URINARY PROBLEMS (pain or burning when urinating, or frequent urination) *BOWEL PROBLEMS (unusual diarrhea, constipation, pain near the anus) TENDERNESS IN MOUTH AND THROAT WITH OR WITHOUT PRESENCE OF ULCERS (sore throat, sores in mouth, or a toothache) UNUSUAL RASH, SWELLING OR PAIN  UNUSUAL VAGINAL DISCHARGE OR ITCHING   Items with * indicate a potential emergency and should be followed up as soon as possible or go to the Emergency Department if any problems should occur.  Please show the CHEMOTHERAPY ALERT CARD or IMMUNOTHERAPY ALERT CARD at check-in to the Emergency Department and triage nurse.  Should you have questions after your visit or need to cancel or reschedule your appointment, please contact [facility] [phone]  and  follow the prompts.  Office hours are 8:00 a.m. to 4:30 p.m. Monday - Friday. Please note that voicemails left after 4:00 p.m. may not be returned until the following business day.  We are closed weekends and major holidays. You have access to a nurse at all times for urgent questions. Please call the main number to the clinic [phone] and follow the prompts.  For any non-urgent questions, you may also contact your provider using MyChart. We now offer e-Visits for anyone 3 and older to request care online for non-urgent symptoms. For details visit [email or web address].   Also download the MyChart app! Go to the app store, search "MyChart", open the app, select [facility], and log in with your MyChart username and password.  Due to Covid, a mask is required upon entering the hospital/clinic. If you do not have a mask, one will be given to you upon arrival. For doctor visits, patients may have 1 support person aged 81 or older with them. For treatment visits, patients cannot have anyone with them due to current Covid guidelines and our immunocompromised population.

## 2021-07-08 NOTE — Patient Instructions (Addendum)
[employer] at [facility] Discharge Instructions  You were seen and examined today by Dr. Delton Coombes.  Dr. Delton Coombes discussed your most recent lab work, which is stable. Continue your Revlimid, you seem to be tolerating it well.  Proceed with Xgeva today.   Follow-up as scheduled.    Thank you for choosing [facility] at [facility] to provide your oncology and hematology care.  To afford each patient quality time with our provider, please arrive at least 15 minutes before your scheduled appointment time.   If you have a lab appointment with the [facility] please come in thru the Main Entrance and check in at the main information desk.  You need to re-schedule your appointment should you arrive 10 or more minutes late.  We strive to give you quality time with our providers, and arriving late affects you and other patients whose appointments are after yours.  Also, if you no show three or more times for appointments you may be dismissed from the clinic at the providers discretion.     Again, thank you for choosing [facility].  Our hope is that these requests will decrease the amount of time that you wait before being seen by our physicians.       _____________________________________________________________  Should you have questions after your visit to [facility], please contact our office at [phone] and follow the prompts.  Our office hours are 8:00 a.m. and 4:30 p.m. Monday - Friday.  Please note that voicemails left after 4:00 p.m. may not be returned until the following business day.  We are closed weekends and major holidays.  You do have access to a nurse 24-7, just call the main number to the clinic [phone] and do not press any options, hold on the line and a nurse will answer the phone.    For prescription refill requests, have your pharmacy contact our office and allow 72 hours.    Due to  Covid, you will need to wear a mask upon entering the hospital. If you do not have a mask, a mask will be given to you at the Main Entrance upon arrival. For doctor visits, patients may have 1 support person age 53 or older with them. For treatment visits, patients can not have anyone with them due to social distancing guidelines and our immunocompromised population.

## 2021-07-08 NOTE — Progress Notes (Signed)
Patient has been assessed, vital signs and labs have been reviewed by Dr. Katragadda. ANC, Creatinine, LFTs, and Platelets are within treatment parameters per Dr. Katragadda. The patient is good to proceed with treatment at this time. Primary RN and pharmacy aware.  

## 2021-07-08 NOTE — Progress Notes (Signed)
[place], [place] [address]   CLINIC:  Medical Oncology/Hematology  PCP:  Lindell Spar, MD [address] / [place] [address]  [phone]  REASON FOR VISIT:  Follow-up for multiple myeloma & normocytic anemia  PRIOR THERAPY:  Velcade x 9 cycles from 10/29/2019 to 05/02/2020 Revlimid 2/3 weeks  CURRENT THERAPY: surveillance  INTERVAL HISTORY:  [name], a 81 y.o. female, returns for routine follow-up for her multiple myeloma & normocytic anemia. Kim Mills was last seen on 06/03/2021.  Today she reports feeling good. She has started Revlimid and reports tolerating it well. She denies new pains and n/v/d. Her bilateral knee pains are stable, and she reports tingling in her feet. Her appetite is good. She is taking Tramadol prn 3-4 times daily.   REVIEW OF SYSTEMS:  Review of Systems  Constitutional:  Negative for appetite change and fatigue.  Respiratory:  Positive for shortness of breath.   Gastrointestinal:  Negative for diarrhea, nausea and vomiting.  Genitourinary:  Positive for frequency.   Musculoskeletal:  Positive for arthralgias (6/10 knees).  Neurological:  Positive for numbness (feet).  Psychiatric/Behavioral:  Positive for sleep disturbance.   All other systems reviewed and are negative.  PAST MEDICAL/SURGICAL HISTORY:  Past Medical History:  Diagnosis Date   Arthritis    Cancer [facility])    Cataract    Chest pain    Associated with weakness and fatigue   Diabetes mellitus    A1c of 7.4 in 08/2010   GERD (gastroesophageal reflux disease)    Glaucoma    Hyperlipidemia    Lipid profile in 08/2010:190, 121, 48, 118; normal CBC and CMet   Hypertension    Lipid profile in 08/2010:190, 121, 48, 118; normal CBC and CMet   Hypothyroidism    Hypothyroidism    Obesity    Past Surgical History:  Procedure Laterality Date   CHOLECYSTECTOMY     COLONOSCOPY N/A 04/01/2014   RMR: Melanosis coli. colonic polyps  removed as described above.    CYSTOSCOPY W/ URETERAL STENT PLACEMENT Right 09/13/2019   Procedure: CYSTOSCOPY WITH RIGHT  RETROGRADE PYELOGRAM; BLADDER BIOPSY;  Surgeon: Cleon Gustin, MD;  Location: [facility];  Service: Urology;  Laterality: Right;   LEFT HEART CATH AND CORONARY ANGIOGRAPHY N/A 10/07/2017   Procedure: LEFT HEART CATH AND CORONARY ANGIOGRAPHY;  Surgeon: Troy Sine, MD;  Location: [facility];  Service: Cardiovascular;  Laterality: N/A;   THYROIDECTOMY, PARTIAL      SOCIAL HISTORY:  Social History   Socioeconomic History   Marital status: Widowed    Spouse name: Not on file   Number of children: 7   Years of education: Not on file   Highest education level: Not on file  Occupational History    Employer: RETIRED  Tobacco Use   Smoking status: Never    Passive exposure: Yes   Smokeless tobacco: Never  Vaping Use   Vaping Use: Never used  Substance and Sexual Activity   Alcohol use: Never   Drug use: Never   Sexual activity: Not Currently  Other Topics Concern   Not on file  Social History Narrative   Daughter, Corky Sing, lives with and assists patient.    Social Determinants of Health   Financial Resource Strain: Low Risk    Difficulty of Paying Living Expenses: Not hard at all  Food Insecurity: No Food Insecurity   Worried About Charity fundraiser in the Last  Year: Never true   Fertile in the Last Year: Never true  Transportation Needs: No Transportation Needs   Lack of Transportation (Medical): No   Lack of Transportation (Non-Medical): No  Physical Activity: Sufficiently Active   Days of Exercise per Week: 5 days   Minutes of Exercise per Session: 30 min  Stress: No Stress Concern Present   Feeling of Stress : Not at all  Social Connections: Moderately Integrated   Frequency of Communication with Friends and Family: More than three times a week   Frequency of Social Gatherings with Friends and Family: More than three times a week    Attends Religious Services: 1 to 4 times per year   Active Member of [employer] or Organizations: Yes   Attends Archivist Meetings: 1 to 4 times per year   Marital Status: Widowed  Human resources officer Violence: Not At Risk   Fear of Current or Ex-Partner: No   Emotionally Abused: No   Physically Abused: No   Sexually Abused: No    FAMILY HISTORY:  Family History  Problem Relation Age of Onset   Anemia Father    Arthritis Mother    Stroke Brother    Cancer Sister        unknown kind   Diabetes Sister    Dementia Sister    Cancer Niece        Breast   Cancer Brother    Healthy Son    Healthy Son    Healthy Son    Healthy Daughter    Healthy Daughter    Healthy Daughter    Sarcoidosis Daughter     CURRENT MEDICATIONS:  Current Outpatient Medications  Medication Sig Dispense Refill   acyclovir (ZOVIRAX) 400 MG tablet TAKE ONE TABLET (400MG TOTAL) BY MOUTH TWICE DAILY 60 tablet 6   albuterol (VENTOLIN HFA) 108 (90 Base) MCG/ACT inhaler Inhale 2 puffs into the lungs every 6 (six) hours as needed for wheezing or shortness of breath. 8 g 0   ASPIRIN 81 PO Take 1 tablet by mouth daily.     blood glucose meter kit and supplies Dispense based on patient and insurance preference. Use up to four times daily as directed. (FOR ICD-10 E10.9, E11.9). 1 each 0   Blood Glucose Monitoring Suppl (BLOOD GLUCOSE SYSTEM PAK) KIT Please dispense based on patient and insurance preference. Use as directed to monitor FSBS 2x daily. Dx: E11.9. 1 each 1   Calcium 500 MG tablet Take 600 mg by mouth 2 (two) times daily.     fluticasone (FLONASE) 50 MCG/ACT nasal spray Place 2 sprays into both nostrils daily. 16 g 6   gabapentin (NEURONTIN) 300 MG capsule Take 1 capsule (300 mg total) by mouth 2 (two) times daily. Dose/frequency change 180 capsule 3   glipiZIDE (GLUCOTROL) 5 MG tablet TAKE ONE TABLET (5MG TOTAL) BY MOUTH TWOTIMES DAILY (Patient taking differently: Take 5 mg by mouth 2 (two) times daily  before a meal.) 180 tablet 3   Glucose Blood (BLOOD GLUCOSE TEST STRIPS) STRP Please dispense based on patient and insurance preference. Use as directed to monitor FSBS 2x daily. Dx: E11.9. 100 each 11   guaiFENesin-dextromethorphan (ROBITUSSIN DM) 100-10 MG/5ML syrup Take 5 mLs by mouth every 4 (four) hours as needed for cough. 118 mL 0   HM LORATADINE 10 MG tablet TAKE ONE TABLET (10MG TOTAL) BY MOUTH DAILY (Patient taking differently: Take 10 mg by mouth daily as needed for allergies.) 30 tablet 0  Lancets MISC Please dispense based on patient and insurance preference. Use as directed to monitor FSBS 2x daily. Dx: E11.9. 100 each 11   lenalidomide (REVLIMID) 10 MG capsule Take 1 capsule (10 mg total) by mouth daily. 14 days on, 7 days off 14 capsule 0   magic mouthwash w/lidocaine SOLN Take 5 mLs by mouth 4 (four) times daily. 450 mL 0   magnesium oxide (MAG-OX) 400 (240 Mg) MG tablet TAKE ONE TABLET (400MG TOTAL) BY MOUTH TWO TIMES DAILY (Patient taking differently: Take 400 mg by mouth 2 (two) times daily.) 120 tablet 1   meclizine (ANTIVERT) 25 MG tablet Take 25 mg by mouth every 6 (six) hours as needed for dizziness.     metFORMIN (GLUCOPHAGE) 1000 MG tablet TAKE ONE TABLET BY MOUTH TWICE A DAY 60 tablet 3   metoprolol tartrate (LOPRESSOR) 25 MG tablet TAKE ONE-HALF TABLET (12.5MG TOTAL) BY MOUTH TWO TIMES DAILY (Patient taking differently: Take 12.5 mg by mouth 2 (two) times daily.) 90 tablet 3   omeprazole (PRILOSEC) 40 MG capsule TAKE ONE CAPSULE BY MOUTH DAILY 90 capsule 3   polyethylene glycol (MIRALAX / GLYCOLAX) 17 g packet Take 17 g by mouth daily. 14 each 0   potassium chloride (KLOR-CON M) 10 MEQ tablet Take 2 tablets (20 mEq total) by mouth daily. 60 tablet 2   simvastatin (ZOCOR) 40 MG tablet TAKE ONE TABLET (40MG TOTAL) BY MOUTH BEDTIME (Patient taking differently: Take 40 mg by mouth at bedtime.) 90 tablet 3   SYNTHROID 137 MCG tablet TAKE ONE TABLET (137MCG TOTAL) BY MOUTH DAILY  BEFORE BREAKFAST 90 tablet 1   traMADol (ULTRAM) 50 MG tablet Take 1 tablet (50 mg total) by mouth every 6 (six) hours as needed. 60 tablet 0   zinc oxide (MEIJER ZINC OXIDE) 20 % ointment Apply 1 application topically as needed for irritation. 56.7 g 0   No current facility-administered medications for this visit.    ALLERGIES:  No Known Allergies  PHYSICAL EXAM:  Performance status (ECOG): 3 - Symptomatic, >50% confined to bed  There were no vitals filed for this visit. Wt Readings from Last 3 Encounters:  06/03/21 173 lb 14.4 oz (78.9 kg)  02/17/21 155 lb (70.3 kg)  11/11/20 160 lb 9.6 oz (72.8 kg)   Physical Exam Vitals reviewed.  Constitutional:      Appearance: Normal appearance. She is obese.     Comments: In wheelchair  Cardiovascular:     Rate and Rhythm: Normal rate and regular rhythm.     Pulses: Normal pulses.     Heart sounds: Normal heart sounds.  Pulmonary:     Effort: Pulmonary effort is normal.     Breath sounds: Normal breath sounds.  Musculoskeletal:     Right lower leg: No edema.     Left lower leg: No edema.  Neurological:     General: No focal deficit present.     Mental Status: She is alert and oriented to person, place, and time.  Psychiatric:        Mood and Affect: Mood normal.        Behavior: Behavior normal.    LABORATORY DATA:  I have reviewed the labs as listed.     Latest Ref Rng & Units 07/08/2021    1:10 PM 05/27/2021    3:36 PM 04/14/2021   12:24 PM  CBC  WBC 4.0 - 10.5 K/uL 7.9   9.0   7.7    Hemoglobin 12.0 - 15.0 g/dL  11.5   11.5   11.6    Hematocrit 36.0 - 46.0 % 36.1   36.4   38.3    Platelets 150 - 400 K/uL 225   224   218        Latest Ref Rng & Units 05/27/2021    3:36 PM 04/14/2021   12:24 PM 03/17/2021   12:50 PM  CMP  Glucose 70 - 99 mg/dL 121   146   83    BUN 8 - 23 mg/dL 27   23   32    Creatinine 0.44 - 1.00 mg/dL 1.12   1.06   1.20    Sodium 135 - 145 mmol/L 141   141   140    Potassium 3.5 - 5.1 mmol/L 4.5    4.2   3.9    Chloride 98 - 111 mmol/L 109   110   106    CO2 22 - 32 mmol/L 24   26   24     Calcium 8.9 - 10.3 mg/dL 9.2   9.3   8.9    Total Protein 6.5 - 8.1 g/dL 7.1   7.3   7.2    Total Bilirubin 0.3 - 1.2 mg/dL 0.6   0.4   0.5    Alkaline Phos 38 - 126 U/L 44   66   54    AST 15 - 41 U/L 15   21   23     ALT 0 - 44 U/L 12   24   29         Component Value Date/Time   RBC 3.72 (L) 07/08/2021 1310   MCV 97.0 07/08/2021 1310   MCH 30.9 07/08/2021 1310   MCHC 31.9 07/08/2021 1310   RDW 14.5 07/08/2021 1310   LYMPHSABS 2.7 07/08/2021 1310   MONOABS 0.4 07/08/2021 1310   EOSABS 0.3 07/08/2021 1310   BASOSABS 0.1 07/08/2021 1310    DIAGNOSTIC IMAGING:  I have independently reviewed the scans and discussed with the patient. No results found.   ASSESSMENT:  1.  Normocytic anemia: -Patient seen at the request of Dr. Buelah Manis for further work-up and management of normocytic anemia. -Recent CBC on 08/21/2019 shows hemoglobin 6.9 with MCV of 88.3.  White count and platelets are normal.  No history of CKD.  Denies any bleeding per rectum or melena. Last colonoscopy on 04/01/2014 shows diffusely pigmented rectal mucosa consistent with melanosis [name]  Diffusely pigmented colonic mucosa, 2 diminutive polyps in the mid ascending segment, otherwise the remainder of the colon mucosa was normal.  Biopsy consistent with tubular adenoma. -CT renal study on 05/11/2019 shows normal-sized spleen with normal liver.  No other abnormal adenopathy. -Denies any prior history of blood transfusion.  She is currently taking iron tablet twice daily.   2.  IgG lambda plasma cell myeloma: -Skeletal survey on 09/12/2019 shows diffuse faint lucencies in the skull, bilateral femurs. -Bone marrow biopsy on 09/27/2019 shows 70% plasma cells in hypercellular marrow.  Chromosome analysis was 73, XX. FISH panel was normal.  However quality of specimen is compromised. -LDH normal, beta-2 microglobulin  2.9.  M spike was 2.1 g.  Kappa light chains 19.7, lambda light chains 14.3 with ratio of 1.38. -24-hour urine shows nonnephrotic range proteinuria.  Urine immunofixation was positive. - 9 cycles of RVD from 10/29/2019 through 05/02/2020. - Because of her age and comorbidities, she was not considered a candidate for bone marrow transplant. - Maintenance Revlimid was held due to  severe weakness. - Revlimid 10 mg 2 weeks on/1 week off restarted back on 06/03/2021 due to worsening M spike.   PLAN:  1.  IgG lambda plasma cell myeloma: - Myeloma panel on 05/27/2021 showed M spike 0.5 g, up from 0.2 g previously.  Lambda light chains are 16 with ratio of 1.73. - At last visit, we have restarted her back on Revlimid.  So far she is tolerating well.  Denies any major GI side effects. - Mild neuropathy in the feet has been stable. - Reviewed labs from today with normal LFTs.  Creatinine 1.12 and stable.  CBC was grossly normal. - Recommend continue Revlimid 10 mg 2 weeks on/1 week off daily. - Plan to repeat SPEP and free light chains in 4 weeks and return to clinic in 8 weeks with repeat labs.     2.  Diarrhea: - Diarrhea has improved since magnesium was cut back to once daily.   3.  Bilateral knee pains: - She was evaluated by Dr. Regino Bellow and received steroid injections in both knees. - Continue tramadol up to 4 tablets daily as needed.   4.  Bone protection: - Calcium today is 8.2.  May proceed with denosumab.  5.  Hypokalemia: - Continue potassium supplements.  Potassium today is normal. - Continue magnesium 1 tablet daily.  Orders placed this encounter:  No orders of the defined types were placed in this encounter.    Derek Jack, MD [facility] [phone]   I, Thana Ates, am acting as a scribe for Dr. Derek Jack.  I, Derek Jack MD, have reviewed the above documentation for accuracy and completeness, and I agree with the above.

## 2021-07-08 NOTE — Progress Notes (Signed)
Kim Mills presents today for injection per the provider's orders.  Xgeva administration without incident; injection site WNL; see MAR for injection details.  Pt denies any tooth or jaw pain and no recent or future dental appointments at this time. Pt reports taking Calcium and Vit D supplements as directed. Patient tolerated procedure well and without incident.  No questions or complaints noted at this time. Pt's Calcium noted to be 8.2 today.   Discharged from clinic via wheelchair in stable condition. Alert and oriented x 3. F/U with [facility] as scheduled.

## 2021-07-14 ENCOUNTER — Other Ambulatory Visit: Payer: Self-pay | Admitting: Internal Medicine

## 2021-07-18 ENCOUNTER — Other Ambulatory Visit: Payer: Self-pay | Admitting: Nurse Practitioner

## 2021-07-20 ENCOUNTER — Other Ambulatory Visit: Payer: Self-pay | Admitting: Internal Medicine

## 2021-07-21 ENCOUNTER — Telehealth: Payer: Self-pay | Admitting: Internal Medicine

## 2021-07-21 NOTE — Telephone Encounter (Signed)
Please advice  

## 2021-07-21 NOTE — Telephone Encounter (Signed)
  PATEL Pt  Pt daughter called stating she has been out of Glipizide '5mg'$  for 2 wks. States she has been trying with phar to get this refilled. Can you please refill?   [facility]

## 2021-07-23 ENCOUNTER — Other Ambulatory Visit: Payer: Self-pay | Admitting: Family Medicine

## 2021-07-23 DIAGNOSIS — E1140 Type 2 diabetes mellitus with diabetic neuropathy, unspecified: Secondary | ICD-10-CM

## 2021-07-23 MED ORDER — GLIPIZIDE 5 MG PO TABS: 5.0000 mg | ORAL_TABLET | Freq: Two times a day (BID) | ORAL | 1 refills | Status: DC

## 2021-07-23 NOTE — Telephone Encounter (Signed)
Refill sent to her pharmacy

## 2021-07-23 NOTE — Telephone Encounter (Signed)
Pt informed

## 2021-07-29 ENCOUNTER — Other Ambulatory Visit (HOSPITAL_COMMUNITY): Payer: Self-pay

## 2021-07-29 MED ORDER — LENALIDOMIDE 10 MG PO CAPS: 10.0000 mg | ORAL_CAPSULE | Freq: Every day | ORAL | 0 refills | Status: DC

## 2021-07-29 NOTE — Telephone Encounter (Signed)
Chart reviewed. Revlimid refilled per last office note with Dr. Katragadda.  

## 2021-08-05 ENCOUNTER — Inpatient Hospital Stay (HOSPITAL_COMMUNITY): Payer: Medicare Other | Attending: Hematology

## 2021-08-05 ENCOUNTER — Inpatient Hospital Stay (HOSPITAL_COMMUNITY): Payer: Medicare Other

## 2021-08-17 ENCOUNTER — Other Ambulatory Visit (HOSPITAL_COMMUNITY): Payer: Self-pay | Admitting: Hematology

## 2021-08-17 ENCOUNTER — Other Ambulatory Visit (HOSPITAL_COMMUNITY): Payer: Self-pay

## 2021-08-17 DIAGNOSIS — M170 Bilateral primary osteoarthritis of knee: Secondary | ICD-10-CM

## 2021-08-17 DIAGNOSIS — C9000 Multiple myeloma not having achieved remission: Secondary | ICD-10-CM

## 2021-08-17 MED ORDER — LENALIDOMIDE 10 MG PO CAPS: 10.0000 mg | ORAL_CAPSULE | Freq: Every day | ORAL | 0 refills | Status: DC

## 2021-08-17 NOTE — Telephone Encounter (Signed)
Chart reviewed. Revlimid refilled per last office note with Dr. Katragadda.  

## 2021-09-01 ENCOUNTER — Other Ambulatory Visit (HOSPITAL_COMMUNITY): Payer: Self-pay

## 2021-09-01 MED ORDER — LENALIDOMIDE 10 MG PO CAPS: 10.0000 mg | ORAL_CAPSULE | Freq: Every day | ORAL | 0 refills | Status: DC

## 2021-09-01 NOTE — Telephone Encounter (Signed)
Chart reviewed. Revlimid refilled per last office note with Dr. Katragadda.  

## 2021-09-02 ENCOUNTER — Encounter (HOSPITAL_COMMUNITY): Payer: Self-pay

## 2021-09-02 ENCOUNTER — Inpatient Hospital Stay (HOSPITAL_COMMUNITY): Payer: Medicare Other

## 2021-09-02 ENCOUNTER — Inpatient Hospital Stay (HOSPITAL_COMMUNITY): Payer: Medicare Other | Attending: Hematology

## 2021-09-02 ENCOUNTER — Inpatient Hospital Stay (HOSPITAL_COMMUNITY): Payer: Medicare Other | Admitting: Hematology

## 2021-09-02 VITALS — BP 168/70 | HR 60 | Temp 98.1°F | Resp 18

## 2021-09-02 DIAGNOSIS — C9000 Multiple myeloma not having achieved remission: Secondary | ICD-10-CM | POA: Insufficient documentation

## 2021-09-02 DIAGNOSIS — E876 Hypokalemia: Secondary | ICD-10-CM | POA: Diagnosis not present

## 2021-09-02 LAB — CBC WITH DIFFERENTIAL/PLATELET
Abs Immature Granulocytes: 0.07 10*3/uL (ref 0.00–0.07)
Basophils Absolute: 0.1 10*3/uL (ref 0.0–0.1)
Basophils Relative: 1 %
Eosinophils Absolute: 0.3 10*3/uL (ref 0.0–0.5)
Eosinophils Relative: 4 %
HCT: 38.1 % (ref 36.0–46.0)
Hemoglobin: 12.4 g/dL (ref 12.0–15.0)
Immature Granulocytes: 1 %
Lymphocytes Relative: 42 %
Lymphs Abs: 2.9 10*3/uL (ref 0.7–4.0)
MCH: 30.5 pg (ref 26.0–34.0)
MCHC: 32.5 g/dL (ref 30.0–36.0)
MCV: 93.8 fL (ref 80.0–100.0)
Monocytes Absolute: 0.3 10*3/uL (ref 0.1–1.0)
Monocytes Relative: 4 %
Neutro Abs: 3.4 10*3/uL (ref 1.7–7.7)
Neutrophils Relative %: 48 %
Platelets: 179 10*3/uL (ref 150–400)
RBC: 4.06 MIL/uL (ref 3.87–5.11)
RDW: 14.7 % (ref 11.5–15.5)
WBC: 7 10*3/uL (ref 4.0–10.5)
nRBC: 0 % (ref 0.0–0.2)

## 2021-09-02 LAB — COMPREHENSIVE METABOLIC PANEL
ALT: 11 U/L (ref 0–44)
AST: 15 U/L (ref 15–41)
Albumin: 3.7 g/dL (ref 3.5–5.0)
Alkaline Phosphatase: 49 U/L (ref 38–126)
Anion gap: 7 (ref 5–15)
BUN: 12 mg/dL (ref 8–23)
CO2: 23 mmol/L (ref 22–32)
Calcium: 8.5 mg/dL — ABNORMAL LOW (ref 8.9–10.3)
Chloride: 111 mmol/L (ref 98–111)
Creatinine, Ser: 0.86 mg/dL (ref 0.44–1.00)
GFR, Estimated: >60 mL/min (ref >60)
Glucose, Bld: 207 mg/dL — ABNORMAL HIGH (ref 70–99)
Potassium: 3.3 mmol/L — ABNORMAL LOW (ref 3.5–5.1)
Sodium: 141 mmol/L (ref 135–145)
Total Bilirubin: 1 mg/dL (ref 0.3–1.2)
Total Protein: 7 g/dL (ref 6.5–8.1)

## 2021-09-02 LAB — MAGNESIUM: Magnesium: 1.4 mg/dL — ABNORMAL LOW (ref 1.7–2.4)

## 2021-09-02 MED ORDER — DENOSUMAB 120 MG/1.7ML ~~LOC~~ SOLN
120.0000 mg | Freq: Once | SUBCUTANEOUS | Status: AC
  Administered 2021-09-02: 120 mg via SUBCUTANEOUS
  Filled 2021-09-02: qty 1.7

## 2021-09-02 NOTE — Patient Instructions (Signed)
[place] [facility]  Discharge Instructions: Thank you for choosing [facility] to provide your oncology and hematology care.  If you have a lab appointment with the [facility], please come in thru the Main Entrance and check in at the main information desk.  Wear comfortable clothing and clothing appropriate for easy access to any Portacath or PICC line.   We strive to give you quality time with your provider. You may need to reschedule your appointment if you arrive late (15 or more minutes).  Arriving late affects you and other patients whose appointments are after yours.  Also, if you miss three or more appointments without notifying the office, you may be dismissed from the clinic at the provider's discretion.      For prescription refill requests, have your pharmacy contact our office and allow 72 hours for refills to be completed.    Today you received the following chemotherapy and/or immunotherapy agents xgeva.  Denosumab injection What is this medication? DENOSUMAB (den oh sue mab) slows bone breakdown. Prolia is used to treat osteoporosis in women after menopause and in men, and in people who are taking corticosteroids for 6 months or more. [name] is used to treat a high calcium level due to cancer and to prevent bone fractures and other bone problems caused by multiple myeloma or cancer bone metastases. [name] is also used to treat giant cell tumor of the bone. This medicine may be used for other purposes; ask your health care provider or pharmacist if you have questions. COMMON BRAND NAME(S): Prolia, XGEVA What should I tell my care team before I take this medication? They need to know if you have any of these conditions: dental disease having surgery or tooth extraction infection kidney disease low levels of calcium or Vitamin D in the blood malnutrition on hemodialysis skin conditions or sensitivity thyroid or parathyroid disease an unusual reaction to  denosumab, other medicines, foods, dyes, or preservatives pregnant or trying to get pregnant breast-feeding How should I use this medication? This medicine is for injection under the skin. It is given by a health care professional in a hospital or clinic setting. A special MedGuide will be given to you before each treatment. Be sure to read this information carefully each time. For Prolia, talk to your pediatrician regarding the use of this medicine in children. Special care may be needed. For [name], talk to your pediatrician regarding the use of this medicine in children. While this drug may be prescribed for children as young as 13 years for selected conditions, precautions do apply. Overdosage: If you think you have taken too much of this medicine contact a poison control center or emergency room at once. NOTE: This medicine is only for you. Do not share this medicine with others. What if I miss a dose? It is important not to miss your dose. Call your doctor or health care professional if you are unable to keep an appointment. What may interact with this medication? Do not take this medicine with any of the following medications: other medicines containing denosumab This medicine may also interact with the following medications: medicines that lower your chance of fighting infection steroid medicines like prednisone or cortisone This list may not describe all possible interactions. Give your health care provider a list of all the medicines, herbs, non-prescription drugs, or dietary supplements you use. Also tell them if you smoke, drink alcohol, or use illegal drugs. Some items may interact with your medicine. What should  I watch for while using this medication? Visit your doctor or health care professional for regular checks on your progress. Your doctor or health care professional may order blood tests and other tests to see how you are doing. Call your doctor or health care professional for  advice if you get a fever, chills or sore throat, or other symptoms of a cold or flu. Do not treat yourself. This drug may decrease your body's ability to fight infection. Try to avoid being around people who are sick. You should make sure you get enough calcium and vitamin D while you are taking this medicine, unless your doctor tells you not to. Discuss the foods you eat and the vitamins you take with your health care professional. See your dentist regularly. Brush and floss your teeth as directed. Before you have any dental work done, tell your dentist you are receiving this medicine. Do not become pregnant while taking this medicine or for 5 months after stopping it. Talk with your doctor or health care professional about your birth control options while taking this medicine. Women should inform their doctor if they wish to become pregnant or think they might be pregnant. There is a potential for serious side effects to an unborn child. Talk to your health care professional or pharmacist for more information. What side effects may I notice from receiving this medication? Side effects that you should report to your doctor or health care professional as soon as possible: allergic reactions like skin rash, itching or hives, swelling of the face, lips, or tongue bone pain breathing problems dizziness jaw pain, especially after dental work redness, blistering, peeling of the skin signs and symptoms of infection like fever or chills; cough; sore throat; pain or trouble passing urine signs of low calcium like fast heartbeat, muscle cramps or muscle pain; pain, tingling, numbness in the hands or feet; seizures unusual bleeding or bruising unusually weak or tired Side effects that usually do not require medical attention (report to your doctor or health care professional if they continue or are bothersome): constipation diarrhea headache joint pain loss of appetite muscle pain runny  nose tiredness upset stomach This list may not describe all possible side effects. Call your doctor for medical advice about side effects. You may report side effects to FDA at 1-800-FDA-1088. Where should I keep my medication? This medicine is only given in a clinic, doctor's office, or other health care setting and will not be stored at home. NOTE: This sheet is a summary. It may not cover all possible information. If you have questions about this medicine, talk to your doctor, pharmacist, or health care provider.  2023 Elsevier/Gold Standard (2017-06-03 00:00:00)       To help prevent nausea and vomiting after your treatment, we encourage you to take your nausea medication as directed.  BELOW ARE SYMPTOMS THAT SHOULD BE REPORTED IMMEDIATELY: *FEVER GREATER THAN 100.4 F (38 C) OR HIGHER *CHILLS OR SWEATING *NAUSEA AND VOMITING THAT IS NOT CONTROLLED WITH YOUR NAUSEA MEDICATION *UNUSUAL SHORTNESS OF BREATH *UNUSUAL BRUISING OR BLEEDING *URINARY PROBLEMS (pain or burning when urinating, or frequent urination) *BOWEL PROBLEMS (unusual diarrhea, constipation, pain near the anus) TENDERNESS IN MOUTH AND THROAT WITH OR WITHOUT PRESENCE OF ULCERS (sore throat, sores in mouth, or a toothache) UNUSUAL RASH, SWELLING OR PAIN  UNUSUAL VAGINAL DISCHARGE OR ITCHING   Items with * indicate a potential emergency and should be followed up as soon as possible or go to the Emergency Department if any  problems should occur.  Please show the CHEMOTHERAPY ALERT CARD or IMMUNOTHERAPY ALERT CARD at check-in to the Emergency Department and triage nurse.  Should you have questions after your visit or need to cancel or reschedule your appointment, please contact [facility] [phone]  and follow the prompts.  Office hours are 8:00 a.m. to 4:30 p.m. Monday - Friday. Please note that voicemails left after 4:00 p.m. may not be returned until the following business day.  We are closed weekends and  major holidays. You have access to a nurse at all times for urgent questions. Please call the main number to the clinic [phone] and follow the prompts.  For any non-urgent questions, you may also contact your provider using MyChart. We now offer e-Visits for anyone 41 and older to request care online for non-urgent symptoms. For details visit [email or web address].   Also download the MyChart app! Go to the app store, search "MyChart", open the app, select [facility], and log in with your MyChart username and password.  Masks are optional in the cancer centers. If you would like for your care team to wear a mask while they are taking care of you, please let them know. For doctor visits, patients may have with them one support person who is at least 79 years old. At this time, visitors are not allowed in the infusion area.

## 2021-09-02 NOTE — Progress Notes (Signed)
Patient taking calcium as directed.  Denied tooth, jaw, and leg pain.  No recent or upcoming dental visits.  Labs reviewed.  Patient tolerated injection with no complaints voiced.  See MAR for details.  Patient stable during and after injection.  Site clean and dry with no bruising or swelling noted.  Band aid applied.  Vss with discharge and left in satisfactory condition with no s/s of distress noted.   

## 2021-09-03 LAB — KAPPA/LAMBDA LIGHT CHAINS
Kappa free light chain: 37 mg/L — ABNORMAL HIGH (ref 3.3–19.4)
Kappa, lambda light chain ratio: 1.54 (ref 0.26–1.65)
Lambda free light chains: 24 mg/L (ref 5.7–26.3)

## 2021-09-03 LAB — LACTATE DEHYDROGENASE: LDH: 166 U/L (ref 98–192)

## 2021-09-04 LAB — IMMUNOFIXATION ELECTROPHORESIS
IgA: 398 mg/dL (ref 64–422)
IgG (Immunoglobin G), Serum: 1131 mg/dL (ref 586–1602)
IgM (Immunoglobulin M), Srm: 22 mg/dL — ABNORMAL LOW (ref 26–217)
Total Protein ELP: 6.5 g/dL (ref 6.0–8.5)

## 2021-09-07 LAB — PROTEIN ELECTROPHORESIS, SERUM
A/G Ratio: 1.1 (ref 0.7–1.7)
Albumin ELP: 3.5 g/dL (ref 2.9–4.4)
Alpha-1-Globulin: 0.2 g/dL (ref 0.0–0.4)
Alpha-2-Globulin: 0.7 g/dL (ref 0.4–1.0)
Beta Globulin: 1.2 g/dL (ref 0.7–1.3)
Gamma Globulin: 1 g/dL (ref 0.4–1.8)
Globulin, Total: 3.1 g/dL (ref 2.2–3.9)
M-Spike, %: 0.5 g/dL — ABNORMAL HIGH
Total Protein ELP: 6.6 g/dL (ref 6.0–8.5)

## 2021-09-09 ENCOUNTER — Inpatient Hospital Stay: Payer: Medicare Other | Attending: Hematology | Admitting: Hematology

## 2021-09-09 VITALS — BP 147/80 | HR 54 | Temp 97.7°F | Resp 16

## 2021-09-09 DIAGNOSIS — Z993 Dependence on wheelchair: Secondary | ICD-10-CM | POA: Insufficient documentation

## 2021-09-09 DIAGNOSIS — Z79899 Other long term (current) drug therapy: Secondary | ICD-10-CM | POA: Diagnosis not present

## 2021-09-09 DIAGNOSIS — M25561 Pain in right knee: Secondary | ICD-10-CM | POA: Insufficient documentation

## 2021-09-09 DIAGNOSIS — D649 Anemia, unspecified: Secondary | ICD-10-CM | POA: Insufficient documentation

## 2021-09-09 DIAGNOSIS — E876 Hypokalemia: Secondary | ICD-10-CM | POA: Insufficient documentation

## 2021-09-09 DIAGNOSIS — M25562 Pain in left knee: Secondary | ICD-10-CM | POA: Insufficient documentation

## 2021-09-09 DIAGNOSIS — C9000 Multiple myeloma not having achieved remission: Secondary | ICD-10-CM | POA: Diagnosis not present

## 2021-09-09 DIAGNOSIS — R197 Diarrhea, unspecified: Secondary | ICD-10-CM | POA: Diagnosis not present

## 2021-09-09 NOTE — Progress Notes (Signed)
Patient is taking Revlimid as prescribed.  She has not missed any doses and reports diarrhea as the only side effect at this time.

## 2021-09-09 NOTE — Patient Instructions (Addendum)
[facility] at [facility] Discharge Instructions   You were seen and examined today by Dr. Delton Coombes.  He reviewed the results of your lab work which are stable. Your magnesium is low. Try breaking in half the magnesium tablet and take as prescribed.   Reach out to the local orthopedic doctor (Dr. Amedeo Kinsman) regarding your knee pain.   Return as scheduled.    Thank you for choosing [facility] at [facility] to provide your oncology and hematology care.  To afford each patient quality time with our provider, please arrive at least 15 minutes before your scheduled appointment time.   If you have a lab appointment with the [facility] please come in thru the Main Entrance and check in at the main information desk.  You need to re-schedule your appointment should you arrive 10 or more minutes late.  We strive to give you quality time with our providers, and arriving late affects you and other patients whose appointments are after yours.  Also, if you no show three or more times for appointments you may be dismissed from the clinic at the providers discretion.     Again, thank you for choosing [facility].  Our hope is that these requests will decrease the amount of time that you wait before being seen by our physicians.       _____________________________________________________________  Should you have questions after your visit to [facility], please contact our office at [phone] and follow the prompts.  Our office hours are 8:00 a.m. and 4:30 p.m. Monday - Friday.  Please note that voicemails left after 4:00 p.m. may not be returned until the following business day.  We are closed weekends and major holidays.  You do have access to a nurse 24-7, just call the main number to the clinic [phone] and do not press any options, hold on the line and a nurse will answer the phone.    For prescription refill requests,  have your pharmacy contact our office and allow 72 hours.    Due to Covid, you will need to wear a mask upon entering the hospital. If you do not have a mask, a mask will be given to you at the Main Entrance upon arrival. For doctor visits, patients may have 1 support person age 79 or older with them. For treatment visits, patients can not have anyone with them due to social distancing guidelines and our immunocompromised population.

## 2021-09-09 NOTE — Progress Notes (Signed)
[place], [place] [address]   CLINIC:  Medical Oncology/Hematology  PCP:  Lindell Spar, MD [address] / [place] [address]  [phone]  REASON FOR VISIT:  Follow-up for multiple myeloma & normocytic Mills  PRIOR THERAPY:  Velcade x 9 cycles from 10/29/2019 to 05/02/2020 Revlimid 2/3 weeks  CURRENT THERAPY: surveillance  INTERVAL HISTORY:  Kim Mills Mills, Kim Mills 81 y.o. Mills, Kim Mills Mills. Kim Mills Mills was last seen on 07/08/2021.  Today she reports feeling well. She reports pain in her knees bilaterally which has not improved with Tramadol.   REVIEW OF SYSTEMS:  Review of Systems  Constitutional:  Positive for fatigue. Negative for appetite change.  Respiratory:  Positive for shortness of breath.   Gastrointestinal:  Positive for abdominal pain, diarrhea, nausea and vomiting.  Genitourinary:  Positive for frequency.   Musculoskeletal:  Positive for arthralgias (knees).  Neurological:  Positive for dizziness, headaches and numbness.  Psychiatric/Behavioral:  Positive for sleep disturbance.   All other systems reviewed and are negative.   PAST MEDICAL/SURGICAL HISTORY:  Past Medical History:  Diagnosis Date   Arthritis    Cancer [facility])    Cataract    Chest pain    Associated with weakness and fatigue   Diabetes mellitus    A1c of 7.4 in 08/2010   GERD (gastroesophageal reflux disease)    Glaucoma    Hyperlipidemia    Lipid profile in 08/2010:190, 121, 48, 118; normal CBC and CMet   Hypertension    Lipid profile in 08/2010:190, 121, 48, 118; normal CBC and CMet   Hypothyroidism    Hypothyroidism    Obesity    Past Surgical History:  Procedure Laterality Date   CHOLECYSTECTOMY     COLONOSCOPY N/Kim Mills 04/01/2014   RMR: Melanosis coli. colonic polyps removed as described above.    CYSTOSCOPY W/ URETERAL STENT PLACEMENT Right 09/13/2019   Procedure: CYSTOSCOPY  WITH RIGHT  RETROGRADE PYELOGRAM; BLADDER BIOPSY;  Surgeon: Cleon Gustin, MD;  Location: [facility];  Service: Urology;  Laterality: Right;   LEFT HEART CATH AND CORONARY ANGIOGRAPHY N/Kim Mills 10/07/2017   Procedure: LEFT HEART CATH AND CORONARY ANGIOGRAPHY;  Surgeon: Troy Sine, MD;  Location: [facility];  Service: Cardiovascular;  Laterality: N/Kim Mills;   THYROIDECTOMY, PARTIAL      SOCIAL HISTORY:  Social History   Socioeconomic History   Marital status: Widowed    Spouse name: Not on file   Number of children: 7   Years of education: Not on file   Highest education level: Not on file  Occupational History    Employer: RETIRED  Tobacco Use   Smoking status: Never    Passive exposure: Yes   Smokeless tobacco: Never  Vaping Use   Vaping Use: Never used  Substance and Sexual Activity   Alcohol use: Never   Drug use: Never   Sexual activity: Not Currently  Other Topics Concern   Not on file  Social History Narrative   Daughter, Kim Mills Mills, lives with and assists patient.    Social Determinants of Health   Financial Resource Strain: Low Risk  (11/05/2020)   Overall Financial Resource Strain (CARDIA)    Difficulty of Paying Living Expenses: Not hard at all  Food Insecurity: No Food Insecurity (11/05/2020)   Hunger Vital Sign    Worried About Running Out of Food in the Last Year: Never true  Ran Out of Food in the Last Year: Never true  Transportation Needs: No Transportation Needs (11/05/2020)   PRAPARE - Hydrologist (Medical): No    Lack of Transportation (Non-Medical): No  Physical Activity: Sufficiently Active (11/05/2020)   Exercise Vital Sign    Days of Exercise per Week: 5 days    Minutes of Exercise per Session: 30 min  Stress: No Stress Concern Present (11/05/2020)   [place]    Feeling of Stress : Not at all  Social Connections: Moderately Integrated (11/05/2020)    Social Connection and Isolation Panel [NHANES]    Frequency of Communication with Friends and Family: More than three times Kim Mills week    Frequency of Social Gatherings with Friends and Family: More than three times Kim Mills week    Attends Religious Services: 1 to 4 times per year    Active Member of [employer] or Organizations: Yes    Attends Archivist Meetings: 1 to 4 times per year    Marital Status: Widowed  Intimate Partner Violence: Not At Risk (11/05/2020)   Humiliation, Afraid, Rape, and Kick questionnaire    Fear of Current or Ex-Partner: No    Emotionally Abused: No    Physically Abused: No    Sexually Abused: No    FAMILY HISTORY:  Family History  Problem Relation Age of Onset   Mills Father    Arthritis Mother    Stroke Brother    Cancer Sister        unknown kind   Diabetes Sister    Dementia Sister    Cancer Niece        Breast   Cancer Brother    Healthy Son    Healthy Son    Healthy Son    Healthy Daughter    Healthy Daughter    Healthy Daughter    Sarcoidosis Daughter     CURRENT MEDICATIONS:  Current Outpatient Medications  Medication Sig Dispense Refill   albuterol (VENTOLIN HFA) 108 (90 Base) MCG/ACT inhaler Inhale 2 puffs into the lungs every 6 (six) hours as needed for wheezing or shortness of breath. 8 g 0   ASPIRIN 81 PO Take 1 tablet by mouth daily.     blood glucose meter kit and supplies Dispense based on patient and insurance preference. Use up to four times daily as directed. (FOR ICD-10 E10.9, E11.9). 1 each 0   Blood Glucose Monitoring Suppl (BLOOD GLUCOSE SYSTEM PAK) KIT Please dispense based on patient and insurance preference. Use as directed to monitor FSBS 2x daily. Dx: E11.9. 1 each 1   Calcium 500 MG tablet Take 600 mg by mouth 2 (two) times daily.     fluticasone (FLONASE) 50 MCG/ACT nasal spray Place 2 sprays into both nostrils daily. 16 g 6   gabapentin (NEURONTIN) 300 MG capsule Take 1 capsule (300 mg total) by mouth 2 (two) times  daily. Dose/frequency change 180 capsule 3   glipiZIDE (GLUCOTROL) 5 MG tablet Take 1 tablet (5 mg total) by mouth 2 (two) times daily before Kim Mills meal. 90 tablet 1   Glucose Blood (BLOOD GLUCOSE TEST STRIPS) STRP Please dispense based on patient and insurance preference. Use as directed to monitor FSBS 2x daily. Dx: E11.9. 100 each 11   guaiFENesin-dextromethorphan (ROBITUSSIN DM) 100-10 MG/5ML syrup Take 5 mLs by mouth every 4 (four) hours as needed for cough. 118 mL 0   HM LORATADINE 10 MG tablet  TAKE ONE TABLET (10MG TOTAL) BY MOUTH DAILY (Patient taking differently: Take 10 mg by mouth daily as needed for allergies.) 30 tablet 0   Lancets MISC Please dispense based on patient and insurance preference. Use as directed to monitor FSBS 2x daily. Dx: E11.9. 100 each 11   lenalidomide (REVLIMID) 10 MG capsule Take 1 capsule (10 mg total) by mouth daily. 14 days on, 7 days off 14 capsule 0   magic mouthwash w/lidocaine SOLN Take 5 mLs by mouth 4 (four) times daily. 450 mL 0   magnesium oxide (MAG-OX) 400 (240 Mg) MG tablet TAKE ONE TABLET (400MG TOTAL) BY MOUTH TWO TIMES DAILY (Patient taking differently: Take 400 mg by mouth 2 (two) times daily.) 120 tablet 1   meclizine (ANTIVERT) 25 MG tablet Take 25 mg by mouth every 6 (six) hours as needed for dizziness.     metFORMIN (GLUCOPHAGE) 1000 MG tablet TAKE ONE TABLET BY MOUTH TWICE Kim Mills DAY 60 tablet 3   metoprolol tartrate (LOPRESSOR) 25 MG tablet TAKE ONE-HALF TABLET (12.5MG TOTAL) BY MOUTH TWO TIMES DAILY 90 tablet 3   omeprazole (PRILOSEC) 40 MG capsule TAKE ONE CAPSULE BY MOUTH DAILY 90 capsule 3   polyethylene glycol (MIRALAX / GLYCOLAX) 17 g packet Take 17 g by mouth daily. 14 each 0   potassium chloride (KLOR-CON M) 10 MEQ tablet Take 2 tablets (20 mEq total) by mouth daily. 60 tablet 2   simvastatin (ZOCOR) 40 MG tablet TAKE ONE TABLET (40MG TOTAL) BY MOUTH BEDTIME 90 tablet 3   SYNTHROID 137 MCG tablet TAKE ONE TABLET (137MCG TOTAL) BY MOUTH DAILY  BEFORE BREAKFAST 90 tablet 1   traMADol (ULTRAM) 50 MG tablet TAKE 1 TABLET BY MOUTH EVERY 6 HOURS AS NEEDED 120 tablet 0   zinc oxide (MEIJER ZINC OXIDE) 20 % ointment Apply 1 application topically as needed for irritation. 56.7 g 0   No current facility-administered medications for this visit.    ALLERGIES:  No Known Allergies  PHYSICAL EXAM:  Performance status (ECOG): 3 - Symptomatic, >50% confined to bed  There were no vitals filed for this visit. Wt Readings from Last 3 Encounters:  07/08/21 181 lb 8 oz (82.3 kg)  06/03/21 173 lb 14.4 oz (78.9 kg)  02/17/21 155 lb (70.3 kg)   Physical Exam Vitals reviewed.  Constitutional:      Appearance: Normal appearance.     Comments: In wheelchair  Cardiovascular:     Rate and Rhythm: Normal rate and regular rhythm.     Pulses: Normal pulses.     Heart sounds: Normal heart sounds.  Pulmonary:     Effort: Pulmonary effort is normal.     Breath sounds: Normal breath sounds.  Neurological:     General: No focal deficit present.     Mental Status: She is alert and oriented to person, place, and time.  Psychiatric:        Mood and Affect: Mood normal.        Behavior: Behavior normal.     LABORATORY DATA:  I have reviewed the labs as listed.     Latest Ref Rng & Units 09/02/2021    1:03 PM 07/08/2021    1:10 PM 05/27/2021    3:36 PM  CBC  WBC 4.0 - 10.5 K/uL 7.0  7.9  9.0   Hemoglobin 12.0 - 15.0 g/dL 12.4  11.5  11.5   Hematocrit 36.0 - 46.0 % 38.1  36.1  36.4   Platelets 150 - 400 K/uL  179  225  224       Latest Ref Rng & Units 09/02/2021    1:03 PM 07/08/2021    1:10 PM 05/27/2021    3:36 PM  CMP  Glucose 70 - 99 mg/dL 207  191  121   BUN 8 - 23 mg/dL _0 Creatinine 0.44 - 1.00 mg/dL 0.86  1.12  1.12   Sodium 135 - 145 mmol/L 141  137  141   Potassium 3.5 - 5.1 mmol/L 3.3  3.9  4.5   Chloride 98 - 111 mmol/L 111  107  109   CO2 22 - 32 mmol/L _1 Calcium 8.9 - 10.3 mg/dL 8.5  8.2  9.2   Total  Protein 6.5 - 8.1 g/dL 7.0  6.9  7.1   Total Bilirubin 0.3 - 1.2 mg/dL 1.0  0.8  0.6   Alkaline Phos 38 - 126 U/L 49  56  44   AST 15 - 41 U/L _2 ALT 0 - 44 U/L _3 Component Value Date/Time   RBC 4.06 09/02/2021 1303   MCV 93.8 09/02/2021 1303   MCH 30.5 09/02/2021 1303   MCHC 32.5 09/02/2021 1303   RDW 14.7 09/02/2021 1303   LYMPHSABS 2.9 09/02/2021 1303   MONOABS 0.3 09/02/2021 1303   EOSABS 0.3 09/02/2021 1303   BASOSABS 0.1 09/02/2021 1303    DIAGNOSTIC IMAGING:  I have independently reviewed the scans and discussed with the patient. No results found.   ASSESSMENT:  1.  Normocytic Mills: -Patient seen at the request of Dr. Buelah Manis for further work-up and management of normocytic Mills. -Recent CBC on 08/21/2019 shows hemoglobin 6.9 with MCV of 88.3.  White count and platelets are normal.  No history of CKD.  Denies any bleeding per rectum or melena. Last colonoscopy on 04/01/2014 shows diffusely pigmented rectal mucosa consistent with melanosis [name]  Diffusely pigmented colonic mucosa, 2 diminutive polyps in the mid ascending segment, otherwise the remainder of the colon mucosa was normal.  Biopsy consistent with tubular adenoma. -CT renal study on 05/11/2019 shows normal-sized spleen with normal liver.  No other abnormal adenopathy. -Denies any prior history of blood transfusion.  She is currently taking iron tablet twice daily.   2.  IgG lambda plasma cell myeloma: -Skeletal survey on 09/12/2019 shows diffuse faint lucencies in the skull, bilateral femurs. -Bone marrow biopsy on 09/27/2019 shows 70% plasma cells in hypercellular marrow.  Chromosome analysis was 69, XX. FISH panel was normal.  However quality of specimen is compromised. -LDH normal, beta-2 microglobulin 2.9.  M spike was 2.1 g.  Kappa light chains 19.7, lambda light chains 14.3 with ratio of 1.38. -24-hour urine shows nonnephrotic range proteinuria.  Urine  immunofixation was positive. - 9 cycles of RVD from 10/29/2019 through 05/02/2020. - Because of her age and comorbidities, she was not considered Kim Mills candidate for bone marrow transplant. - Maintenance Revlimid was held due to severe weakness. - Revlimid 10 mg 2 weeks on/1 week off restarted back on 06/03/2021 due to worsening M spike.   PLAN:  1.  IgG lambda plasma cell myeloma: - She is taking Revlimid 10 mg 2 weeks on/1 week off. - Myeloma panel (09/02/2021): M spike is stable at 0.5 g.  Free light chain ratio is stable at 1.54.  Kappa light chains are 37, slightly increased  from 27 last time.  Immunofixation positive for IgG lambda. - Other labs including creatinine, calcium, LFTs and CBC are within normal limits. - Her myeloma is stable with Revlimid at this time.  I do not want to escalate treatment because of her functional status.  We will continue Revlimid at this time and recheck her myeloma panel in 2 months.     2.  Diarrhea: - She has occasional diarrhea with Revlimid and magnesium which is stable.   3.  Bilateral knee pains: - She was taking tramadol up to 4 tablets daily as needed. - She reported worsening knee pains over the weekend. - I have recommended that she follow-up with orthopedics for possible steroid injections.   4.  Bone protection: - Calcium today is 8.5.  Continue denosumab monthly.  5.  Hypokalemia: - Continue potassium supplements.  Potassium today is 3.3. - She is taking magnesium 1 tablet daily.  Magnesium is low at 1.4.  She will increase magnesium to 2 tablets daily.  Orders placed this encounter:  No orders of the defined types were placed in this encounter.    Derek Jack, MD [facility] [phone]   I, Thana Ates, am acting as Kim Mills scribe for Dr. Derek Jack.  I, Derek Jack MD, have reviewed the above documentation for accuracy and completeness, and I agree with the above.

## 2021-09-21 ENCOUNTER — Other Ambulatory Visit: Payer: Self-pay

## 2021-09-21 MED ORDER — LENALIDOMIDE 10 MG PO CAPS: 10.0000 mg | ORAL_CAPSULE | Freq: Every day | ORAL | 0 refills | Status: DC

## 2021-09-21 NOTE — Telephone Encounter (Signed)
Chart reviewed. Revlimid refilled per last office note with Dr. Katragadda.  

## 2021-09-29 ENCOUNTER — Encounter: Payer: Self-pay | Admitting: Internal Medicine

## 2021-09-29 ENCOUNTER — Ambulatory Visit (INDEPENDENT_AMBULATORY_CARE_PROVIDER_SITE_OTHER): Payer: Medicare Other | Admitting: Internal Medicine

## 2021-09-29 VITALS — BP 142/62 | HR 55 | Ht 64.0 in | Wt 175.0 lb

## 2021-09-29 DIAGNOSIS — N1832 Chronic kidney disease, stage 3b: Secondary | ICD-10-CM | POA: Diagnosis not present

## 2021-09-29 DIAGNOSIS — W57XXXA Bitten or stung by nonvenomous insect and other nonvenomous arthropods, initial encounter: Secondary | ICD-10-CM | POA: Diagnosis not present

## 2021-09-29 DIAGNOSIS — C9000 Multiple myeloma not having achieved remission: Secondary | ICD-10-CM | POA: Diagnosis not present

## 2021-09-29 DIAGNOSIS — M170 Bilateral primary osteoarthritis of knee: Secondary | ICD-10-CM

## 2021-09-29 DIAGNOSIS — E039 Hypothyroidism, unspecified: Secondary | ICD-10-CM | POA: Diagnosis not present

## 2021-09-29 DIAGNOSIS — E1140 Type 2 diabetes mellitus with diabetic neuropathy, unspecified: Secondary | ICD-10-CM | POA: Diagnosis not present

## 2021-09-29 DIAGNOSIS — I10 Essential (primary) hypertension: Secondary | ICD-10-CM

## 2021-09-29 DIAGNOSIS — S60561A Insect bite (nonvenomous) of right hand, initial encounter: Secondary | ICD-10-CM | POA: Diagnosis not present

## 2021-09-29 MED ORDER — HYDROCODONE-ACETAMINOPHEN 5-325 MG PO TABS: 1.0000 | ORAL_TABLET | Freq: Two times a day (BID) | ORAL | 0 refills | Status: DC | PRN

## 2021-09-29 MED ORDER — LOSARTAN POTASSIUM 25 MG PO TABS: 25.0000 mg | ORAL_TABLET | Freq: Every day | ORAL | 1 refills | Status: DC

## 2021-09-29 NOTE — Assessment & Plan Note (Signed)
Lab Results  Component Value Date   TSH 3.414 09/26/2020   On Synthroid 137 mcg QD

## 2021-09-29 NOTE — Assessment & Plan Note (Signed)
Severe, chronic knee pain Has seen orthopedic surgeon-has had steroid injections in the past Currently on tramadol as needed, but has uncontrolled pain Switched to Norco 5- 325 mg twice daily as needed

## 2021-09-29 NOTE — Assessment & Plan Note (Signed)
Lab Results  Component Value Date   HGBA1C 6.1 (H) 05/27/2021   Well controlled On metformin and glipizide Advised to follow diabetic diet On statin Diabetic eye exam: Advised to follow up with Ophthalmology for diabetic eye exam On gabapentin to 300 mg twice daily for DM neuropathy

## 2021-09-29 NOTE — Assessment & Plan Note (Signed)
Not in remission On Revlimid Follows up with Oncology Has chronic pain, on Tramadol Switched to Norco PRN

## 2021-09-29 NOTE — Assessment & Plan Note (Signed)
Has underlying MM Gets routine BMP check during Oncology visits Avoid nephrotoxic agents Added losartan for HTN 

## 2021-09-29 NOTE — Progress Notes (Signed)
Established Patient Office Visit  Subjective:  Patient ID: Kim Mills, female    DOB: 11-30-1940  Age: 81 y.o. MRN: [id number]  CC:  Chief Complaint  Patient presents with   Follow-up    Follow up daughter states pain medication for knees needs to be stronger having to take it more often, bug bite on finger    HPI Kim Mills is a 81 y.o. female with past medical history of multiple myeloma, HTN, CAD, DM with neuropathy, CKD stage 3, hypothyroidism, overactive bladder and chronic pain who presents for f/u of her chronic medical conditions.  HTN: Her BP used to remain stable considering her age, but elevated today. Takes medications regularly. Patient denies headache, dizziness, chest pain, dyspnea or palpitations.  DM: She takes Metformin and Glipizide. Denies any polyuria or polyphagia. Last HbA1C was 6.7.   She complains of chronic, diffuse pain of joints.  She has been getting tramadol for cancer related pain, but has to take it more than QID for pain control.  She follows up with Oncology for h/o MM.  She had an insect bite over middle finger of right hand about 2 days ago (09/27/21), where she had mild swelling and redness, which has been improving.  She has itching over the area.  Denies any fever, chills or recent worsening of fatigue.  Past Medical History:  Diagnosis Date   Arthritis    Cancer [facility])    Cataract    Chest pain    Associated with weakness and fatigue   Diabetes mellitus    A1c of 7.4 in 08/2010   GERD (gastroesophageal reflux disease)    Glaucoma    Hyperlipidemia    Lipid profile in 08/2010:190, 121, 48, 118; normal CBC and CMet   Hypertension    Lipid profile in 08/2010:190, 121, 48, 118; normal CBC and CMet   Hypothyroidism    Hypothyroidism    Obesity     Past Surgical History:  Procedure Laterality Date   CHOLECYSTECTOMY     COLONOSCOPY N/A 04/01/2014   RMR: Melanosis coli. colonic polyps removed as described above.    CYSTOSCOPY  W/ URETERAL STENT PLACEMENT Right 09/13/2019   Procedure: CYSTOSCOPY WITH RIGHT  RETROGRADE PYELOGRAM; BLADDER BIOPSY;  Surgeon: Cleon Gustin, MD;  Location: [facility];  Service: Urology;  Laterality: Right;   LEFT HEART CATH AND CORONARY ANGIOGRAPHY N/A 10/07/2017   Procedure: LEFT HEART CATH AND CORONARY ANGIOGRAPHY;  Surgeon: Troy Sine, MD;  Location: [facility];  Service: Cardiovascular;  Laterality: N/A;   THYROIDECTOMY, PARTIAL      Family History  Problem Relation Age of Onset   Anemia Father    Arthritis Mother    Stroke Brother    Cancer Sister        unknown kind   Diabetes Sister    Dementia Sister    Cancer Niece        Breast   Cancer Brother    Healthy Son    Healthy Son    Healthy Son    Healthy Daughter    Healthy Daughter    Healthy Daughter    Sarcoidosis Daughter     Social History   Socioeconomic History   Marital status: Widowed    Spouse name: Not on file   Number of children: 7   Years of education: Not on file   Highest education level: Not on file  Occupational History    Employer: RETIRED  Tobacco Use  Smoking status: Never    Passive exposure: Yes   Smokeless tobacco: Never  Vaping Use   Vaping Use: Never used  Substance and Sexual Activity   Alcohol use: Never   Drug use: Never   Sexual activity: Not Currently  Other Topics Concern   Not on file  Social History Narrative   Daughter, Corky Sing, lives with and assists patient.    Social Determinants of Health   Financial Resource Strain: Low Risk  (11/05/2020)   Overall Financial Resource Strain (CARDIA)    Difficulty of Paying Living Expenses: Not hard at all  Food Insecurity: No Food Insecurity (11/05/2020)   Hunger Vital Sign    Worried About Running Out of Food in the Last Year: Never true    Ran Out of Food in the Last Year: Never true  Transportation Needs: No Transportation Needs (11/05/2020)   PRAPARE - Hydrologist (Medical): No     Lack of Transportation (Non-Medical): No  Physical Activity: Sufficiently Active (11/05/2020)   Exercise Vital Sign    Days of Exercise per Week: 5 days    Minutes of Exercise per Session: 30 min  Stress: No Stress Concern Present (11/05/2020)   [place]    Feeling of Stress : Not at all  Social Connections: Moderately Integrated (11/05/2020)   Social Connection and Isolation Panel [NHANES]    Frequency of Communication with Friends and Family: More than three times a week    Frequency of Social Gatherings with Friends and Family: More than three times a week    Attends Religious Services: 1 to 4 times per year    Active Member of [employer] or Organizations: Yes    Attends Archivist Meetings: 1 to 4 times per year    Marital Status: Widowed  Intimate Partner Violence: Not At Risk (11/05/2020)   Humiliation, Afraid, Rape, and Kick questionnaire    Fear of Current or Ex-Partner: No    Emotionally Abused: No    Physically Abused: No    Sexually Abused: No    Outpatient Medications Prior to Visit  Medication Sig Dispense Refill   albuterol (VENTOLIN HFA) 108 (90 Base) MCG/ACT inhaler Inhale 2 puffs into the lungs every 6 (six) hours as needed for wheezing or shortness of breath. 8 g 0   ASPIRIN 81 PO Take 1 tablet by mouth daily.     blood glucose meter kit and supplies Dispense based on patient and insurance preference. Use up to four times daily as directed. (FOR ICD-10 E10.9, E11.9). 1 each 0   Blood Glucose Monitoring Suppl (BLOOD GLUCOSE SYSTEM PAK) KIT Please dispense based on patient and insurance preference. Use as directed to monitor FSBS 2x daily. Dx: E11.9. 1 each 1   Calcium 500 MG tablet Take 600 mg by mouth 2 (two) times daily.     fluticasone (FLONASE) 50 MCG/ACT nasal spray Place 2 sprays into both nostrils daily. 16 g 6   gabapentin (NEURONTIN) 300 MG capsule Take 1 capsule (300 mg total) by mouth 2  (two) times daily. Dose/frequency change 180 capsule 3   glipiZIDE (GLUCOTROL) 5 MG tablet Take 1 tablet (5 mg total) by mouth 2 (two) times daily before a meal. 90 tablet 1   Glucose Blood (BLOOD GLUCOSE TEST STRIPS) STRP Please dispense based on patient and insurance preference. Use as directed to monitor FSBS 2x daily. Dx: E11.9. 100 each 11   guaiFENesin-dextromethorphan (ROBITUSSIN DM)  100-10 MG/5ML syrup Take 5 mLs by mouth every 4 (four) hours as needed for cough. 118 mL 0   HM LORATADINE 10 MG tablet TAKE ONE TABLET (10MG TOTAL) BY MOUTH DAILY (Patient taking differently: Take 10 mg by mouth daily as needed for allergies.) 30 tablet 0   Lancets MISC Please dispense based on patient and insurance preference. Use as directed to monitor FSBS 2x daily. Dx: E11.9. 100 each 11   lenalidomide (REVLIMID) 10 MG capsule Take 1 capsule (10 mg total) by mouth daily. 14 days on, 7 days off 14 capsule 0   magic mouthwash w/lidocaine SOLN Take 5 mLs by mouth 4 (four) times daily. 450 mL 0   magnesium oxide (MAG-OX) 400 (240 Mg) MG tablet TAKE ONE TABLET (400MG TOTAL) BY MOUTH TWO TIMES DAILY (Patient taking differently: Take 400 mg by mouth 2 (two) times daily.) 120 tablet 1   meclizine (ANTIVERT) 25 MG tablet Take 25 mg by mouth every 6 (six) hours as needed for dizziness.     metFORMIN (GLUCOPHAGE) 1000 MG tablet TAKE ONE TABLET BY MOUTH TWICE A DAY 60 tablet 3   metoprolol tartrate (LOPRESSOR) 25 MG tablet TAKE ONE-HALF TABLET (12.5MG TOTAL) BY MOUTH TWO TIMES DAILY 90 tablet 3   omeprazole (PRILOSEC) 40 MG capsule TAKE ONE CAPSULE BY MOUTH DAILY 90 capsule 3   polyethylene glycol (MIRALAX / GLYCOLAX) 17 g packet Take 17 g by mouth daily. 14 each 0   potassium chloride (KLOR-CON M) 10 MEQ tablet Take 2 tablets (20 mEq total) by mouth daily. 60 tablet 2   simvastatin (ZOCOR) 40 MG tablet TAKE ONE TABLET (40MG TOTAL) BY MOUTH BEDTIME 90 tablet 3   SYNTHROID 137 MCG tablet TAKE ONE TABLET (137MCG TOTAL) BY  MOUTH DAILY BEFORE BREAKFAST 90 tablet 1   zinc oxide (MEIJER ZINC OXIDE) 20 % ointment Apply 1 application topically as needed for irritation. 56.7 g 0   traMADol (ULTRAM) 50 MG tablet TAKE 1 TABLET BY MOUTH EVERY 6 HOURS AS NEEDED 120 tablet 0   No facility-administered medications prior to visit.    No Known Allergies  ROS Review of Systems  Constitutional:  Positive for fatigue. Negative for chills and fever.  HENT:  Negative for congestion, postnasal drip and sore throat.   Eyes:  Negative for pain and discharge.  Respiratory:  Negative for cough and shortness of breath.   Cardiovascular:  Negative for chest pain and palpitations.  Gastrointestinal:  Negative for abdominal pain, diarrhea, nausea and vomiting.  Endocrine: Negative for polydipsia and polyuria.  Genitourinary:  Negative for dysuria and hematuria.  Musculoskeletal:  Positive for arthralgias, back pain and neck pain.  Skin:  Positive for rash.  Neurological:  Positive for weakness (Generalized). Negative for dizziness.  Psychiatric/Behavioral:  Negative for agitation and behavioral problems.       Objective:    Physical Exam Vitals reviewed.  Constitutional:      General: She is not in acute distress.    Appearance: She is not diaphoretic.     Comments: In wheelchair  HENT:     Head: Normocephalic and atraumatic.     Nose: No congestion.     Mouth/Throat:     Mouth: Mucous membranes are moist.     Pharynx: No posterior oropharyngeal erythema.  Eyes:     General: No scleral icterus.    Extraocular Movements: Extraocular movements intact.  Cardiovascular:     Rate and Rhythm: Normal rate and regular rhythm.  Pulses: Normal pulses.     Heart sounds: Normal heart sounds. No murmur heard. Pulmonary:     Breath sounds: Normal breath sounds. No wheezing or rales.  Musculoskeletal:        General: Tenderness (Bilateral knee, with swelling) present.     Cervical back: Neck supple. No rigidity or  tenderness.     Right lower leg: No edema.     Left lower leg: No edema.  Skin:    General: Skin is warm.     Findings: Erythema (Over middle finger of right hand) present.  Neurological:     General: No focal deficit present.     Mental Status: She is alert and oriented to person, place, and time.     Sensory: Sensory deficit present.     Motor: Weakness (3/5 in b/l LE) present.  Psychiatric:        Mood and Affect: Mood normal.        Behavior: Behavior normal.     BP (!) 142/62 (BP Location: Left Arm, Patient Position: Sitting, Cuff Size: Normal)   Pulse (!) 55   Ht 5' 4" (1.626 m)   Wt 175 lb (79.4 kg)   SpO2 97%   BMI 30.04 kg/m  Wt Readings from Last 3 Encounters:  09/29/21 175 lb (79.4 kg)  07/08/21 181 lb 8 oz (82.3 kg)  06/03/21 173 lb 14.4 oz (78.9 kg)    Lab Results  Component Value Date   TSH 3.414 09/26/2020   Lab Results  Component Value Date   WBC 7.0 09/02/2021   HGB 12.4 09/02/2021   HCT 38.1 09/02/2021   MCV 93.8 09/02/2021   PLT 179 09/02/2021   Lab Results  Component Value Date   NA 141 09/02/2021   K 3.3 (L) 09/02/2021   CO2 23 09/02/2021   GLUCOSE 207 (H) 09/02/2021   BUN 12 09/02/2021   CREATININE 0.86 09/02/2021   BILITOT 1.0 09/02/2021   ALKPHOS 49 09/02/2021   AST 15 09/02/2021   ALT 11 09/02/2021   PROT 7.0 09/02/2021   ALBUMIN 3.7 09/02/2021   CALCIUM 8.5 (L) 09/02/2021   ANIONGAP 7 09/02/2021   EGFR 40 (L) 09/24/2020   Lab Results  Component Value Date   CHOL 162 04/13/2019   Lab Results  Component Value Date   HDL 40 (L) 04/13/2019   Lab Results  Component Value Date   LDLCALC 97 04/13/2019   Lab Results  Component Value Date   TRIG 157 (H) 04/13/2019   Lab Results  Component Value Date   CHOLHDL 4.1 04/13/2019   Lab Results  Component Value Date   HGBA1C 6.1 (H) 05/27/2021      Assessment & Plan:   Problem List Items Addressed This Visit       Cardiovascular and Mediastinum   Hypertension -  Primary    BP Readings from Last 1 Encounters:  09/29/21 (!) 142/62  Uncontrolled Was well-controlled for her age, on Metoprolol Added Losartan 25 mg QD Counseled for compliance with the medications Advised low salt diet      Relevant Medications   losartan (COZAAR) 25 MG tablet     Endocrine   Type 2 diabetes mellitus with diabetic neuropathy, unspecified (HCC)    Lab Results  Component Value Date   HGBA1C 6.1 (H) 05/27/2021  Well controlled On metformin and glipizide Advised to follow diabetic diet On statin Diabetic eye exam: Advised to follow up with Ophthalmology for diabetic eye exam On gabapentin to  300 mg twice daily for DM neuropathy      Relevant Medications   losartan (COZAAR) 25 MG tablet   Other Relevant Orders   Hemoglobin A1c   Hypothyroidism    Lab Results  Component Value Date   TSH 3.414 09/26/2020  On Synthroid 137 mcg QD      Relevant Orders   TSH + free T4     Musculoskeletal and Integument   OA (osteoarthritis) of knee    Severe, chronic knee pain Has seen orthopedic surgeon-has had steroid injections in the past Currently on tramadol as needed, but has uncontrolled pain Switched to Norco 5- 325 mg twice daily as needed      Relevant Medications   HYDROcodone-acetaminophen (NORCO/VICODIN) 5-325 MG tablet     Genitourinary   CKD (chronic kidney disease) stage 3, GFR 30-59 ml/min (HCC)    Has underlying MM Gets routine BMP check during Oncology visits Avoid nephrotoxic agents Added losartan for HTN        Other   Multiple myeloma not having achieved remission ([place])    Not in remission On Revlimid Follows up with Oncology Has chronic pain, on Tramadol Switched to Norco PRN      Relevant Medications   HYDROcodone-acetaminophen (NORCO/VICODIN) 5-325 MG tablet   Other Visit Diagnoses     Insect bite of right hand, initial encounter     Mild erythema over the finger area Improving Advised to apply hydrocortisone cream as  needed for itching       Meds ordered this encounter  Medications   losartan (COZAAR) 25 MG tablet    Sig: Take 1 tablet (25 mg total) by mouth daily.    Dispense:  90 tablet    Refill:  1   HYDROcodone-acetaminophen (NORCO/VICODIN) 5-325 MG tablet    Sig: Take 1 tablet by mouth every 12 (twelve) hours as needed for moderate pain.    Dispense:  60 tablet    Refill:  0    Follow-up: Return in about 3 months (around 12/30/2021) for HTN and chronic pain.    Lindell Spar, MD

## 2021-09-29 NOTE — Assessment & Plan Note (Addendum)
BP Readings from Last 1 Encounters:  09/29/21 (!) 142/62   Uncontrolled Was well-controlled for her age, on Metoprolol Added Losartan 25 mg QD Counseled for compliance with the medications Advised low salt diet

## 2021-09-29 NOTE — Patient Instructions (Signed)
Please start taking Losartan 25 mg once daily.  Please start taking Norco up to twice daily as needed for severe pain.  Please continue to follow low carb diet and ambulate as tolerated.

## 2021-09-30 ENCOUNTER — Inpatient Hospital Stay: Payer: Medicare Other

## 2021-09-30 ENCOUNTER — Inpatient Hospital Stay: Payer: Medicare Other | Admitting: Hematology

## 2021-09-30 ENCOUNTER — Other Ambulatory Visit (HOSPITAL_COMMUNITY): Payer: Medicare Other

## 2021-09-30 ENCOUNTER — Ambulatory Visit (HOSPITAL_COMMUNITY): Payer: Medicare Other

## 2021-09-30 VITALS — BP 137/55 | HR 42 | Temp 96.6°F | Resp 20

## 2021-09-30 DIAGNOSIS — M25562 Pain in left knee: Secondary | ICD-10-CM | POA: Diagnosis not present

## 2021-09-30 DIAGNOSIS — E876 Hypokalemia: Secondary | ICD-10-CM | POA: Diagnosis not present

## 2021-09-30 DIAGNOSIS — C9000 Multiple myeloma not having achieved remission: Secondary | ICD-10-CM

## 2021-09-30 DIAGNOSIS — M25561 Pain in right knee: Secondary | ICD-10-CM | POA: Diagnosis not present

## 2021-09-30 DIAGNOSIS — D649 Anemia, unspecified: Secondary | ICD-10-CM | POA: Diagnosis not present

## 2021-09-30 DIAGNOSIS — R197 Diarrhea, unspecified: Secondary | ICD-10-CM | POA: Diagnosis not present

## 2021-09-30 LAB — CBC WITH DIFFERENTIAL/PLATELET
Abs Immature Granulocytes: 0.04 10*3/uL (ref 0.00–0.07)
Basophils Absolute: 0.1 10*3/uL (ref 0.0–0.1)
Basophils Relative: 1 %
Eosinophils Absolute: 0.2 10*3/uL (ref 0.0–0.5)
Eosinophils Relative: 2 %
HCT: 37.6 % (ref 36.0–46.0)
Hemoglobin: 11.9 g/dL — ABNORMAL LOW (ref 12.0–15.0)
Immature Granulocytes: 1 %
Lymphocytes Relative: 43 %
Lymphs Abs: 3.4 10*3/uL (ref 0.7–4.0)
MCH: 30 pg (ref 26.0–34.0)
MCHC: 31.6 g/dL (ref 30.0–36.0)
MCV: 94.7 fL (ref 80.0–100.0)
Monocytes Absolute: 0.6 10*3/uL (ref 0.1–1.0)
Monocytes Relative: 7 %
Neutro Abs: 3.8 10*3/uL (ref 1.7–7.7)
Neutrophils Relative %: 46 %
Platelets: 174 10*3/uL (ref 150–400)
RBC: 3.97 MIL/uL (ref 3.87–5.11)
RDW: 15 % (ref 11.5–15.5)
WBC: 8.1 10*3/uL (ref 4.0–10.5)
nRBC: 0.4 % — ABNORMAL HIGH (ref 0.0–0.2)

## 2021-09-30 LAB — MAGNESIUM
Magnesium: 1.7 mg/dL (ref 1.7–2.4)
Magnesium: 1.7 mg/dL (ref 1.7–2.4)

## 2021-09-30 LAB — COMPREHENSIVE METABOLIC PANEL
ALT: 10 U/L (ref 0–44)
AST: 16 U/L (ref 15–41)
Albumin: 3.5 g/dL (ref 3.5–5.0)
Alkaline Phosphatase: 44 U/L (ref 38–126)
Anion gap: 6 (ref 5–15)
BUN: 22 mg/dL (ref 8–23)
CO2: 23 mmol/L (ref 22–32)
Calcium: 8.3 mg/dL — ABNORMAL LOW (ref 8.9–10.3)
Chloride: 110 mmol/L (ref 98–111)
Creatinine, Ser: 1.26 mg/dL — ABNORMAL HIGH (ref 0.44–1.00)
GFR, Estimated: 44 mL/min — ABNORMAL LOW (ref >60)
Glucose, Bld: 143 mg/dL — ABNORMAL HIGH (ref 70–99)
Potassium: 3.7 mmol/L (ref 3.5–5.1)
Sodium: 139 mmol/L (ref 135–145)
Total Bilirubin: 0.9 mg/dL (ref 0.3–1.2)
Total Protein: 7 g/dL (ref 6.5–8.1)

## 2021-09-30 MED ORDER — DENOSUMAB 120 MG/1.7ML ~~LOC~~ SOLN
120.0000 mg | Freq: Once | SUBCUTANEOUS | Status: AC
  Administered 2021-09-30: 120 mg via SUBCUTANEOUS
  Filled 2021-09-30: qty 1.7

## 2021-09-30 NOTE — Progress Notes (Signed)
Patient taking calcium as directed.  Denied tooth, jaw, and leg pain.  No recent or upcoming dental visits.  Labs reviewed.  Patient tolerated injection with no complaints voiced.  See MAR for details.  Patient stable during and after injection.  Site clean and dry with no bruising or swelling noted.  Band aid applied.  Vss with discharge and left in satisfactory condition with no s/s of distress noted.   

## 2021-09-30 NOTE — Patient Instructions (Signed)
[facility]  Discharge Instructions: Thank you for choosing [facility] to provide your oncology and hematology care.  If you have a lab appointment with the [facility], please come in thru the Main Entrance and check in at the main information desk.  Wear comfortable clothing and clothing appropriate for easy access to any Portacath or PICC line.   We strive to give you quality time with your provider. You may need to reschedule your appointment if you arrive late (15 or more minutes).  Arriving late affects you and other patients whose appointments are after yours.  Also, if you miss three or more appointments without notifying the office, you may be dismissed from the clinic at the provider's discretion.      For prescription refill requests, have your pharmacy contact our office and allow 72 hours for refills to be completed.    Today you received the following chemotherapy and/or immunotherapy agents xgeva.  Denosumab Injection (Oncology) What is this medication? DENOSUMAB (den oh SUE mab) prevents weakened bones caused by cancer. It may also be used to treat noncancerous bone tumors that cannot be removed by surgery. It can also be used to treat high calcium levels in the blood caused by cancer. It works by blocking a protein that causes bones to break down quickly. This slows down the release of calcium from bones, which lowers calcium levels in your blood. It also makes your bones stronger and less likely to break (fracture). This medicine may be used for other purposes; ask your health care provider or pharmacist if you have questions. COMMON BRAND NAME(S): XGEVA What should I tell my care team before I take this medication? They need to know if you have any of these conditions: Dental disease Having surgery or tooth extraction Infection Kidney disease Low levels of calcium or vitamin D in the blood Malnutrition On hemodialysis Skin conditions  or sensitivity Thyroid or parathyroid disease An unusual reaction to denosumab, other medications, foods, dyes, or preservatives Pregnant or trying to get pregnant Breast-feeding How should I use this medication? This medication is for injection under the skin. It is given by your care team in a hospital or clinic setting. A special MedGuide will be given to you before each treatment. Be sure to read this information carefully each time. Talk to your care team about the use of this medication in children. While it may be prescribed for children as young as 13 years for selected conditions, precautions do apply. Overdosage: If you think you have taken too much of this medicine contact a poison control center or emergency room at once. NOTE: This medicine is only for you. Do not share this medicine with others. What if I miss a dose? Keep appointments for follow-up doses. It is important not to miss your dose. Call your care team if you are unable to keep an appointment. What may interact with this medication? Do not take this medication with any of the following: Other medications containing denosumab This medication may also interact with the following: Medications that lower your chance of fighting infection Steroid medications, such as prednisone or cortisone This list may not describe all possible interactions. Give your health care provider a list of all the medicines, herbs, non-prescription drugs, or dietary supplements you use. Also tell them if you smoke, drink alcohol, or use illegal drugs. Some items may interact with your medicine. What should I watch for while using this medication? Your condition will  be monitored carefully while you are receiving this medication. You may need blood work while taking this medication. This medication may increase your risk of getting an infection. Call your care team for advice if you get a fever, chills, sore throat, or other symptoms of a cold or  flu. Do not treat yourself. Try to avoid being around people who are sick. You should make sure you get enough calcium and vitamin D while you are taking this medication, unless your care team tells you not to. Discuss the foods you eat and the vitamins you take with your care team. Some people who take this medication have severe bone, joint, or muscle pain. This medication may also increase your risk for jaw problems or a broken thigh bone. Tell your care team right away if you have severe pain in your jaw, bones, joints, or muscles. Tell your care team if you have any pain that does not go away or that gets worse. Talk to your care team if you may be pregnant. Serious birth defects can occur if you take this medication during pregnancy and for 5 months after the last dose. You will need a negative pregnancy test before starting this medication. Contraception is recommended while taking this medication and for 5 months after the last dose. Your care team can help you find the option that works for you. What side effects may I notice from receiving this medication? Side effects that you should report to your care team as soon as possible: Allergic reactions--skin rash, itching, hives, swelling of the face, lips, tongue, or throat Bone, joint, or muscle pain Low calcium level--muscle pain or cramps, confusion, tingling, or numbness in the hands or feet Osteonecrosis of the jaw--pain, swelling, or redness in the mouth, numbness of the jaw, poor healing after dental work, unusual discharge from the mouth, visible bones in the mouth Side effects that usually do not require medical attention (report to your care team if they continue or are bothersome): Cough Diarrhea Fatigue Headache Nausea This list may not describe all possible side effects. Call your doctor for medical advice about side effects. You may report side effects to FDA at 1-800-FDA-1088. Where should I keep my medication? This medication  is given in a hospital or clinic. It will not be stored at home. NOTE: This sheet is a summary. It may not cover all possible information. If you have questions about this medicine, talk to your doctor, pharmacist, or health care provider.  2023 Elsevier/Gold Standard (2021-06-15 00:00:00)        To help prevent nausea and vomiting after your treatment, we encourage you to take your nausea medication as directed.  BELOW ARE SYMPTOMS THAT SHOULD BE REPORTED IMMEDIATELY: *FEVER GREATER THAN 100.4 F (38 C) OR HIGHER *CHILLS OR SWEATING *NAUSEA AND VOMITING THAT IS NOT CONTROLLED WITH YOUR NAUSEA MEDICATION *UNUSUAL SHORTNESS OF BREATH *UNUSUAL BRUISING OR BLEEDING *URINARY PROBLEMS (pain or burning when urinating, or frequent urination) *BOWEL PROBLEMS (unusual diarrhea, constipation, pain near the anus) TENDERNESS IN MOUTH AND THROAT WITH OR WITHOUT PRESENCE OF ULCERS (sore throat, sores in mouth, or a toothache) UNUSUAL RASH, SWELLING OR PAIN  UNUSUAL VAGINAL DISCHARGE OR ITCHING   Items with * indicate a potential emergency and should be followed up as soon as possible or go to the Emergency Department if any problems should occur.  Please show the CHEMOTHERAPY ALERT CARD or IMMUNOTHERAPY ALERT CARD at check-in to the Emergency Department and triage nurse.  Should you have   questions after your visit or need to cancel or reschedule your appointment, please contact [facility] [phone]  and follow the prompts.  Office hours are 8:00 a.m. to 4:30 p.m. Monday - Friday. Please note that voicemails left after 4:00 p.m. may not be returned until the following business day.  We are closed weekends and major holidays. You have access to a nurse at all times for urgent questions. Please call the main number to the clinic [phone] and follow the prompts.  For any non-urgent questions, you may also contact your provider using MyChart. We now offer e-Visits for anyone 18  and older to request care online for non-urgent symptoms. For details visit [email or web address].   Also download the MyChart app! Go to the app store, search "MyChart", open the app, select [facility], and log in with your MyChart username and password.  Masks are optional in the cancer centers. If you would like for your care team to wear a mask while they are taking care of you, please let them know. You may have one support person who is at least 79 years old accompany you for your appointments.  

## 2021-10-10 ENCOUNTER — Other Ambulatory Visit: Payer: Self-pay | Admitting: Hematology

## 2021-10-13 ENCOUNTER — Encounter (HOSPITAL_COMMUNITY): Payer: Self-pay | Admitting: Hematology

## 2021-10-19 ENCOUNTER — Other Ambulatory Visit: Payer: Self-pay | Admitting: Internal Medicine

## 2021-10-21 ENCOUNTER — Other Ambulatory Visit: Payer: Self-pay

## 2021-10-21 ENCOUNTER — Inpatient Hospital Stay: Payer: Medicare Other

## 2021-10-21 MED ORDER — LENALIDOMIDE 10 MG PO CAPS: 10.0000 mg | ORAL_CAPSULE | Freq: Every day | ORAL | 0 refills | Status: DC

## 2021-10-21 NOTE — Telephone Encounter (Signed)
Chart reviewed. Revlimid refilled per last office note with Dr. Katragadda.  

## 2021-10-26 ENCOUNTER — Other Ambulatory Visit: Payer: Self-pay | Admitting: Internal Medicine

## 2021-10-26 DIAGNOSIS — M170 Bilateral primary osteoarthritis of knee: Secondary | ICD-10-CM

## 2021-10-26 DIAGNOSIS — C9000 Multiple myeloma not having achieved remission: Secondary | ICD-10-CM

## 2021-10-28 ENCOUNTER — Inpatient Hospital Stay: Payer: Medicare Other | Attending: Hematology

## 2021-10-28 ENCOUNTER — Inpatient Hospital Stay: Payer: Medicare Other

## 2021-10-28 DIAGNOSIS — C9000 Multiple myeloma not having achieved remission: Secondary | ICD-10-CM | POA: Diagnosis not present

## 2021-10-28 LAB — CBC WITH DIFFERENTIAL/PLATELET
Abs Immature Granulocytes: 0.06 10*3/uL (ref 0.00–0.07)
Basophils Absolute: 0.1 10*3/uL (ref 0.0–0.1)
Basophils Relative: 1 %
Eosinophils Absolute: 0.4 10*3/uL (ref 0.0–0.5)
Eosinophils Relative: 7 %
HCT: 37 % (ref 36.0–46.0)
Hemoglobin: 11.9 g/dL — ABNORMAL LOW (ref 12.0–15.0)
Immature Granulocytes: 1 %
Lymphocytes Relative: 43 %
Lymphs Abs: 2.4 10*3/uL (ref 0.7–4.0)
MCH: 30.2 pg (ref 26.0–34.0)
MCHC: 32.2 g/dL (ref 30.0–36.0)
MCV: 93.9 fL (ref 80.0–100.0)
Monocytes Absolute: 0.4 10*3/uL (ref 0.1–1.0)
Monocytes Relative: 8 %
Neutro Abs: 2.2 10*3/uL (ref 1.7–7.7)
Neutrophils Relative %: 40 %
Platelets: 218 10*3/uL (ref 150–400)
RBC: 3.94 MIL/uL (ref 3.87–5.11)
RDW: 15.9 % — ABNORMAL HIGH (ref 11.5–15.5)
WBC: 5.5 10*3/uL (ref 4.0–10.5)
nRBC: 0.4 % — ABNORMAL HIGH (ref 0.0–0.2)

## 2021-10-28 LAB — COMPREHENSIVE METABOLIC PANEL
ALT: 32 U/L (ref 0–44)
AST: 17 U/L (ref 15–41)
Albumin: 3.5 g/dL (ref 3.5–5.0)
Alkaline Phosphatase: 67 U/L (ref 38–126)
Anion gap: 7 (ref 5–15)
BUN: 13 mg/dL (ref 8–23)
CO2: 24 mmol/L (ref 22–32)
Calcium: 8.4 mg/dL — ABNORMAL LOW (ref 8.9–10.3)
Chloride: 107 mmol/L (ref 98–111)
Creatinine, Ser: 0.94 mg/dL (ref 0.44–1.00)
GFR, Estimated: >60 mL/min (ref >60)
Glucose, Bld: 109 mg/dL — ABNORMAL HIGH (ref 70–99)
Potassium: 3.4 mmol/L — ABNORMAL LOW (ref 3.5–5.1)
Sodium: 138 mmol/L (ref 135–145)
Total Bilirubin: 0.9 mg/dL (ref 0.3–1.2)
Total Protein: 7.2 g/dL (ref 6.5–8.1)

## 2021-10-28 LAB — MAGNESIUM: Magnesium: 1.5 mg/dL — ABNORMAL LOW (ref 1.7–2.4)

## 2021-10-28 LAB — LACTATE DEHYDROGENASE: LDH: 158 U/L (ref 98–192)

## 2021-10-29 LAB — KAPPA/LAMBDA LIGHT CHAINS
Kappa free light chain: 35.8 mg/L — ABNORMAL HIGH (ref 3.3–19.4)
Kappa, lambda light chain ratio: 1.55 (ref 0.26–1.65)
Lambda free light chains: 23.1 mg/L (ref 5.7–26.3)

## 2021-11-02 LAB — PROTEIN ELECTROPHORESIS, SERUM
A/G Ratio: 1.1 (ref 0.7–1.7)
Albumin ELP: 3.4 g/dL (ref 2.9–4.4)
Alpha-1-Globulin: 0.3 g/dL (ref 0.0–0.4)
Alpha-2-Globulin: 0.6 g/dL (ref 0.4–1.0)
Beta Globulin: 1.2 g/dL (ref 0.7–1.3)
Gamma Globulin: 0.9 g/dL (ref 0.4–1.8)
Globulin, Total: 3 g/dL (ref 2.2–3.9)
M-Spike, %: 0.5 g/dL — ABNORMAL HIGH
Total Protein ELP: 6.4 g/dL (ref 6.0–8.5)

## 2021-11-02 LAB — IMMUNOFIXATION ELECTROPHORESIS
IgA: 388 mg/dL (ref 64–422)
IgG (Immunoglobin G), Serum: 1033 mg/dL (ref 586–1602)
IgM (Immunoglobulin M), Srm: 21 mg/dL — ABNORMAL LOW (ref 26–217)
Total Protein ELP: 6.4 g/dL (ref 6.0–8.5)

## 2021-11-03 ENCOUNTER — Inpatient Hospital Stay: Payer: Medicare Other

## 2021-11-03 ENCOUNTER — Inpatient Hospital Stay (HOSPITAL_BASED_OUTPATIENT_CLINIC_OR_DEPARTMENT_OTHER): Payer: Medicare Other | Admitting: Hematology

## 2021-11-03 VITALS — BP 140/72 | HR 63 | Temp 98.2°F | Resp 18 | Ht 63.0 in | Wt 155.0 lb

## 2021-11-03 DIAGNOSIS — C9000 Multiple myeloma not having achieved remission: Secondary | ICD-10-CM

## 2021-11-03 MED ORDER — MAGNESIUM OXIDE -MG SUPPLEMENT 400 (240 MG) MG PO TABS: 400.0000 mg | ORAL_TABLET | Freq: Three times a day (TID) | ORAL | 6 refills | Status: DC

## 2021-11-03 MED ORDER — DENOSUMAB 120 MG/1.7ML ~~LOC~~ SOLN
120.0000 mg | Freq: Once | SUBCUTANEOUS | Status: AC
  Administered 2021-11-03: 120 mg via SUBCUTANEOUS
  Filled 2021-11-03: qty 1.7

## 2021-11-03 NOTE — Progress Notes (Signed)
[place], [place] [address]   CLINIC:  Medical Oncology/Hematology  PCP:  Lindell Spar, MD [address] / [place] [address]  [phone]  REASON FOR VISIT:  Follow-up for multiple myeloma & normocytic anemia  PRIOR THERAPY:  Velcade x 9 cycles from 10/29/2019 to 05/02/2020 Revlimid 2/3 weeks  CURRENT THERAPY: surveillance  INTERVAL HISTORY:  [name], a 81 y.o. female, seen for follow-up of multiple myeloma normocytic anemia.  She is accompanied by her caregiver.  She reports knee pains 6 out of 10 in intensity which is stable.  She is tolerating Revlimid very well.  She denies any recent infections or ER visits or hospitalizations.  REVIEW OF SYSTEMS:  Review of Systems  Gastrointestinal:  Positive for constipation and diarrhea.  Musculoskeletal:  Positive for arthralgias (knees).  Neurological:  Positive for dizziness and numbness.  All other systems reviewed and are negative.   PAST MEDICAL/SURGICAL HISTORY:  Past Medical History:  Diagnosis Date   Arthritis    Cancer [facility])    Cataract    Chest pain    Associated with weakness and fatigue   Diabetes mellitus    A1c of 7.4 in 08/2010   GERD (gastroesophageal reflux disease)    Glaucoma    Hyperlipidemia    Lipid profile in 08/2010:190, 121, 48, 118; normal CBC and CMet   Hypertension    Lipid profile in 08/2010:190, 121, 48, 118; normal CBC and CMet   Hypothyroidism    Hypothyroidism    Obesity    Past Surgical History:  Procedure Laterality Date   CHOLECYSTECTOMY     COLONOSCOPY N/A 04/01/2014   RMR: Melanosis coli. colonic polyps removed as described above.    CYSTOSCOPY W/ URETERAL STENT PLACEMENT Right 09/13/2019   Procedure: CYSTOSCOPY WITH RIGHT  RETROGRADE PYELOGRAM; BLADDER BIOPSY;  Surgeon: Cleon Gustin, MD;  Location: [facility];  Service: Urology;  Laterality: Right;   LEFT HEART CATH AND CORONARY ANGIOGRAPHY N/A 10/07/2017   Procedure:  LEFT HEART CATH AND CORONARY ANGIOGRAPHY;  Surgeon: Troy Sine, MD;  Location: [facility];  Service: Cardiovascular;  Laterality: N/A;   THYROIDECTOMY, PARTIAL      SOCIAL HISTORY:  Social History   Socioeconomic History   Marital status: Widowed    Spouse name: Not on file   Number of children: 7   Years of education: Not on file   Highest education level: Not on file  Occupational History    Employer: RETIRED  Tobacco Use   Smoking status: Never    Passive exposure: Yes   Smokeless tobacco: Never  Vaping Use   Vaping Use: Never used  Substance and Sexual Activity   Alcohol use: Never   Drug use: Never   Sexual activity: Not Currently  Other Topics Concern   Not on file  Social History Narrative   Daughter, Corky Sing, lives with and assists patient.    Social Determinants of Health   Financial Resource Strain: Low Risk  (11/05/2020)   Overall Financial Resource Strain (CARDIA)    Difficulty of Paying Living Expenses: Not hard at all  Food Insecurity: No Food Insecurity (11/05/2020)   Hunger Vital Sign    Worried About Running Out of Food in the Last Year: Never true    Ran Out of Food in the Last Year: Never true  Transportation Needs: No Transportation Needs (11/05/2020)   PRAPARE - Transportation    Lack of Transportation (  Medical): No    Lack of Transportation (Non-Medical): No  Physical Activity: Sufficiently Active (11/05/2020)   Exercise Vital Sign    Days of Exercise per Week: 5 days    Minutes of Exercise per Session: 30 min  Stress: No Stress Concern Present (11/05/2020)   [place]    Feeling of Stress : Not at all  Social Connections: Moderately Integrated (11/05/2020)   Social Connection and Isolation Panel [NHANES]    Frequency of Communication with Friends and Family: More than three times a week    Frequency of Social Gatherings with Friends and Family: More than three times a  week    Attends Religious Services: 1 to 4 times per year    Active Member of [employer] or Organizations: Yes    Attends Archivist Meetings: 1 to 4 times per year    Marital Status: Widowed  Intimate Partner Violence: Not At Risk (11/05/2020)   Humiliation, Afraid, Rape, and Kick questionnaire    Fear of Current or Ex-Partner: No    Emotionally Abused: No    Physically Abused: No    Sexually Abused: No    FAMILY HISTORY:  Family History  Problem Relation Age of Onset   Anemia Father    Arthritis Mother    Stroke Brother    Cancer Sister        unknown kind   Diabetes Sister    Dementia Sister    Cancer Niece        Breast   Cancer Brother    Healthy Son    Healthy Son    Healthy Son    Healthy Daughter    Healthy Daughter    Healthy Daughter    Sarcoidosis Daughter     CURRENT MEDICATIONS:  Current Outpatient Medications  Medication Sig Dispense Refill   albuterol (VENTOLIN HFA) 108 (90 Base) MCG/ACT inhaler Inhale 2 puffs into the lungs every 6 (six) hours as needed for wheezing or shortness of breath. 8 g 0   ASPIRIN 81 PO Take 1 tablet by mouth daily.     blood glucose meter kit and supplies Dispense based on patient and insurance preference. Use up to four times daily as directed. (FOR ICD-10 E10.9, E11.9). 1 each 0   Blood Glucose Monitoring Suppl (BLOOD GLUCOSE SYSTEM PAK) KIT Please dispense based on patient and insurance preference. Use as directed to monitor FSBS 2x daily. Dx: E11.9. 1 each 1   Calcium 500 MG tablet Take 600 mg by mouth 2 (two) times daily.     fluticasone (FLONASE) 50 MCG/ACT nasal spray Place 2 sprays into both nostrils daily. 16 g 6   gabapentin (NEURONTIN) 300 MG capsule Take 1 capsule (300 mg total) by mouth 2 (two) times daily. Dose/frequency change 180 capsule 3   glipiZIDE (GLUCOTROL) 5 MG tablet Take 1 tablet (5 mg total) by mouth 2 (two) times daily before a meal. 90 tablet 1   Glucose Blood (BLOOD GLUCOSE TEST STRIPS) STRP  Please dispense based on patient and insurance preference. Use as directed to monitor FSBS 2x daily. Dx: E11.9. 100 each 11   guaiFENesin-dextromethorphan (ROBITUSSIN DM) 100-10 MG/5ML syrup Take 5 mLs by mouth every 4 (four) hours as needed for cough. 118 mL 0   HM LORATADINE 10 MG tablet TAKE ONE TABLET (10MG TOTAL) BY MOUTH DAILY (Patient taking differently: Take 10 mg by mouth daily as needed for allergies.) 30 tablet 0   HYDROcodone-acetaminophen (NORCO/VICODIN)  5-325 MG tablet TAKE ONE (1) TABLET BY MOUTH EVERY 12 HOURS AS NEEDED FOR MODERATE PAIN 60 tablet 0   Lancets MISC Please dispense based on patient and insurance preference. Use as directed to monitor FSBS 2x daily. Dx: E11.9. 100 each 11   lenalidomide (REVLIMID) 10 MG capsule Take 1 capsule (10 mg total) by mouth daily. 14 days on, 7 days off 14 capsule 0   losartan (COZAAR) 25 MG tablet Take 1 tablet (25 mg total) by mouth daily. 90 tablet 1   magic mouthwash w/lidocaine SOLN Take 5 mLs by mouth 4 (four) times daily. 450 mL 0   magnesium oxide (MAG-OX) 400 (240 Mg) MG tablet Take 1 tablet (400 mg total) by mouth 3 (three) times daily. 90 tablet 6   meclizine (ANTIVERT) 25 MG tablet Take 25 mg by mouth every 6 (six) hours as needed for dizziness.     metFORMIN (GLUCOPHAGE) 1000 MG tablet TAKE ONE TABLET BY MOUTH TWICE A DAY 60 tablet 3   metoprolol tartrate (LOPRESSOR) 25 MG tablet TAKE ONE-HALF TABLET (12.5MG TOTAL) BY MOUTH TWO TIMES DAILY 90 tablet 3   omeprazole (PRILOSEC) 40 MG capsule TAKE ONE CAPSULE BY MOUTH DAILY 90 capsule 3   polyethylene glycol (MIRALAX / GLYCOLAX) 17 g packet Take 17 g by mouth daily. 14 each 0   potassium chloride (KLOR-CON M) 10 MEQ tablet Take 2 tablets (20 mEq total) by mouth daily. 60 tablet 2   simvastatin (ZOCOR) 40 MG tablet TAKE ONE TABLET (40MG TOTAL) BY MOUTH BEDTIME 90 tablet 3   SYNTHROID 137 MCG tablet TAKE ONE TABLET (137MCG TOTAL) BY MOUTH DAILY BEFORE BREAKFAST 90 tablet 1   traMADol  (ULTRAM) 50 MG tablet TAKE 1 TABLET BY MOUTH EVERY 6 HOURS AS NEEDED 120 tablet 0   zinc oxide (MEIJER ZINC OXIDE) 20 % ointment Apply 1 application topically as needed for irritation. 56.7 g 0   No current facility-administered medications for this visit.    ALLERGIES:  No Known Allergies  PHYSICAL EXAM:  Performance status (ECOG): 3 - Symptomatic, >50% confined to bed  Vitals:   11/03/21 1209  BP: (!) 140/72  Pulse: 63  Resp: 18  Temp: 98.2 F (36.8 C)  SpO2: 99%   Wt Readings from Last 3 Encounters:  11/03/21 155 lb (70.3 kg)  09/29/21 175 lb (79.4 kg)  07/08/21 181 lb 8 oz (82.3 kg)   Physical Exam Vitals reviewed.  Constitutional:      Appearance: Normal appearance.     Comments: In wheelchair  Cardiovascular:     Rate and Rhythm: Normal rate and regular rhythm.     Pulses: Normal pulses.     Heart sounds: Normal heart sounds.  Pulmonary:     Effort: Pulmonary effort is normal.     Breath sounds: Normal breath sounds.  Neurological:     General: No focal deficit present.     Mental Status: She is alert and oriented to person, place, and time.  Psychiatric:        Mood and Affect: Mood normal.        Behavior: Behavior normal.     LABORATORY DATA:  I have reviewed the labs as listed.     Latest Ref Rng & Units 10/28/2021   10:57 AM 09/30/2021   11:15 AM 09/02/2021    1:03 PM  CBC  WBC 4.0 - 10.5 K/uL 5.5  8.1  7.0   Hemoglobin 12.0 - 15.0 g/dL 11.9  11.9  12.4  Hematocrit 36.0 - 46.0 % 37.0  37.6  38.1   Platelets 150 - 400 K/uL 218  174  179       Latest Ref Rng & Units 10/28/2021   10:57 AM 09/30/2021    9:40 AM 09/02/2021    1:03 PM  CMP  Glucose 70 - 99 mg/dL 109  143  207   BUN 8 - 23 mg/dL _0 Creatinine 0.44 - 1.00 mg/dL 0.94  1.26  0.86   Sodium 135 - 145 mmol/L 138  139  141   Potassium 3.5 - 5.1 mmol/L 3.4  3.7  3.3   Chloride 98 - 111 mmol/L 107  110  111   CO2 22 - 32 mmol/L _1 Calcium 8.9 - 10.3 mg/dL 8.4  8.3   8.5   Total Protein 6.5 - 8.1 g/dL 7.2  7.0  7.0   Total Bilirubin 0.3 - 1.2 mg/dL 0.9  0.9  1.0   Alkaline Phos 38 - 126 U/L 67  44  49   AST 15 - 41 U/L _2 ALT 0 - 44 U/L 32  10  11       Component Value Date/Time   RBC 3.94 10/28/2021 1057   MCV 93.9 10/28/2021 1057   MCH 30.2 10/28/2021 1057   MCHC 32.2 10/28/2021 1057   RDW 15.9 (H) 10/28/2021 1057   LYMPHSABS 2.4 10/28/2021 1057   MONOABS 0.4 10/28/2021 1057   EOSABS 0.4 10/28/2021 1057   BASOSABS 0.1 10/28/2021 1057    DIAGNOSTIC IMAGING:  I have independently reviewed the scans and discussed with the patient. No results found.   ASSESSMENT:  1.  Normocytic anemia: -Patient seen at the request of Dr. Buelah Manis for further work-up and management of normocytic anemia. -Recent CBC on 08/21/2019 shows hemoglobin 6.9 with MCV of 88.3.  White count and platelets are normal.  No history of CKD.  Denies any bleeding per rectum or melena. Last colonoscopy on 04/01/2014 shows diffusely pigmented rectal mucosa consistent with melanosis [name]  Diffusely pigmented colonic mucosa, 2 diminutive polyps in the mid ascending segment, otherwise the remainder of the colon mucosa was normal.  Biopsy consistent with tubular adenoma. -CT renal study on 05/11/2019 shows normal-sized spleen with normal liver.  No other abnormal adenopathy. -Denies any prior history of blood transfusion.  She is currently taking iron tablet twice daily.   2.  IgG lambda plasma cell myeloma: -Skeletal survey on 09/12/2019 shows diffuse faint lucencies in the skull, bilateral femurs. -Bone marrow biopsy on 09/27/2019 shows 70% plasma cells in hypercellular marrow.  Chromosome analysis was 40, XX. FISH panel was normal.  However quality of specimen is compromised. -LDH normal, beta-2 microglobulin 2.9.  M spike was 2.1 g.  Kappa light chains 19.7, lambda light chains 14.3 with ratio of 1.38. -24-hour urine shows nonnephrotic range proteinuria.   Urine immunofixation was positive. - 9 cycles of RVD from 10/29/2019 through 05/02/2020. - Because of her age and comorbidities, she was not considered a candidate for bone marrow transplant. - Maintenance Revlimid was held due to severe weakness. - Revlimid 10 mg 2 weeks on/1 week off restarted back on 06/03/2021 due to worsening M spike.   PLAN:  1.  IgG lambda plasma cell myeloma: - She is taking Revlimid 10 mg 2 weeks on/1 week off. - Myeloma panel (10/28/2021): M spike stable at 0.5 g.  Kappa light chains are 35.8 and ratio is 1.55 and stable.  Immunofixation positive for IgG lambda. - Creatinine, LFTs and CBC are grossly within normal limits. - Based on her performance status, I would not make any changes to her treatment plan. - Continue Revlimid maintenance.  RTC 2 months with repeat labs.     2.  Hypomagnesemia: - She is taking magnesium twice daily.  Magnesium today is 1.5. - Increase magnesium to 3 times daily.   3.  Bilateral knee pains: - Continue tramadol 3 to 4 tablets daily as needed.   4.  Bone protection: - Calcium is 8.4.  Continue denosumab monthly.  5.  Hypokalemia: - Continue potassium supplements.  Potassium today is 3.4.  Orders placed this encounter:  No orders of the defined types were placed in this encounter.    Derek Jack, MD [place] [phone]

## 2021-11-03 NOTE — Patient Instructions (Addendum)
[facility]  Discharge Instructions  You were seen and examined today by Dr. Delton Coombes.   Dr. Delton Coombes has reviewed your labs which are stable.  Continue taking Revlimid as prescribed.  Follow-up as scheduled.   Thank you for choosing [facility] to provide your oncology and hematology care.   To afford each patient quality time with our provider, please arrive at least 15 minutes before your scheduled appointment time. You may need to reschedule your appointment if you arrive late (10 or more minutes). Arriving late affects you and other patients whose appointments are after yours.  Also, if you miss three or more appointments without notifying the office, you may be dismissed from the clinic at the provider's discretion.    Again, thank you for choosing [facility].  Our hope is that these requests will decrease the amount of time that you wait before being seen by our physicians.   If you have a lab appointment with the [facility] please come in thru the Main Entrance and check in at the main information desk.           _____________________________________________________________  Should you have questions after your visit to [facility], please contact our office at [phone] and follow the prompts.  Our office hours are 8:00 a.m. to 4:30 p.m. Monday - Thursday and 8:00 a.m. to 2:30 p.m. Friday.  Please note that voicemails left after 4:00 p.m. may not be returned until the following business day.  We are closed weekends and all major holidays.  You do have access to a nurse 24-7, just call the main number to the clinic [phone] and do not press any options, hold on the line and a nurse will answer the phone.    For prescription refill requests, have your pharmacy contact our office and allow 72 hours.    Masks are optional in the cancer centers. If you would like for your care team to wear  a mask while they are taking care of you, please let them know. You may have one support person who is at least 79 years old accompany you for your appointments.

## 2021-11-09 ENCOUNTER — Ambulatory Visit: Payer: Medicare Other | Admitting: Internal Medicine

## 2021-11-16 ENCOUNTER — Other Ambulatory Visit: Payer: Self-pay | Admitting: *Deleted

## 2021-11-17 ENCOUNTER — Encounter (HOSPITAL_COMMUNITY): Payer: Self-pay | Admitting: Hematology

## 2021-11-17 ENCOUNTER — Other Ambulatory Visit: Payer: Self-pay

## 2021-11-17 MED ORDER — LENALIDOMIDE 10 MG PO CAPS: 10.0000 mg | ORAL_CAPSULE | Freq: Every day | ORAL | 0 refills | Status: DC

## 2021-11-17 NOTE — Telephone Encounter (Signed)
Chart reviewed. Revlimid refilled per last office note with Dr. Katragadda.  

## 2021-11-20 ENCOUNTER — Other Ambulatory Visit: Payer: Self-pay | Admitting: Internal Medicine

## 2021-11-20 DIAGNOSIS — M170 Bilateral primary osteoarthritis of knee: Secondary | ICD-10-CM

## 2021-11-20 DIAGNOSIS — C9000 Multiple myeloma not having achieved remission: Secondary | ICD-10-CM

## 2021-11-25 ENCOUNTER — Inpatient Hospital Stay: Payer: Medicare Other

## 2021-11-25 ENCOUNTER — Inpatient Hospital Stay: Payer: Medicare Other | Admitting: Hematology

## 2021-12-01 ENCOUNTER — Inpatient Hospital Stay: Payer: Medicare Other | Attending: Hematology

## 2021-12-01 ENCOUNTER — Inpatient Hospital Stay: Payer: Medicare Other

## 2021-12-01 VITALS — BP 129/85 | HR 98 | Temp 99.0°F | Resp 18

## 2021-12-01 DIAGNOSIS — C9000 Multiple myeloma not having achieved remission: Secondary | ICD-10-CM | POA: Diagnosis not present

## 2021-12-01 DIAGNOSIS — E876 Hypokalemia: Secondary | ICD-10-CM

## 2021-12-01 LAB — COMPREHENSIVE METABOLIC PANEL
ALT: 23 U/L (ref 0–44)
AST: 36 U/L (ref 15–41)
Albumin: 3.8 g/dL (ref 3.5–5.0)
Alkaline Phosphatase: 60 U/L (ref 38–126)
Anion gap: 9 (ref 5–15)
BUN: 17 mg/dL (ref 8–23)
CO2: 25 mmol/L (ref 22–32)
Calcium: 8.6 mg/dL — ABNORMAL LOW (ref 8.9–10.3)
Chloride: 105 mmol/L (ref 98–111)
Creatinine, Ser: 1.19 mg/dL — ABNORMAL HIGH (ref 0.44–1.00)
GFR, Estimated: 47 mL/min — ABNORMAL LOW (ref >60)
Glucose, Bld: 157 mg/dL — ABNORMAL HIGH (ref 70–99)
Potassium: 3.1 mmol/L — ABNORMAL LOW (ref 3.5–5.1)
Sodium: 139 mmol/L (ref 135–145)
Total Bilirubin: 1 mg/dL (ref 0.3–1.2)
Total Protein: 7.6 g/dL (ref 6.5–8.1)

## 2021-12-01 MED ORDER — DENOSUMAB 120 MG/1.7ML ~~LOC~~ SOLN
120.0000 mg | Freq: Once | SUBCUTANEOUS | Status: AC
  Administered 2021-12-01: 120 mg via SUBCUTANEOUS
  Filled 2021-12-01: qty 1.7

## 2021-12-01 MED ORDER — POTASSIUM CHLORIDE CRYS ER 20 MEQ PO TBCR
40.0000 meq | EXTENDED_RELEASE_TABLET | Freq: Once | ORAL | Status: AC
  Administered 2021-12-01: 40 meq via ORAL
  Filled 2021-12-01: qty 2

## 2021-12-01 NOTE — Patient Instructions (Signed)
[facility]  Discharge Instructions: Thank you for choosing [facility] to provide your oncology and hematology care.  If you have a lab appointment with the Cancer Center, please come in thru the Main Entrance and check in at the main information desk.  Wear comfortable clothing and clothing appropriate for easy access to any Portacath or PICC line.   We strive to give you quality time with your provider. You may need to reschedule your appointment if you arrive late (15 or more minutes).  Arriving late affects you and other patients whose appointments are after yours.  Also, if you miss three or more appointments without notifying the office, you may be dismissed from the clinic at the provider's discretion.      For prescription refill requests, have your pharmacy contact our office and allow 72 hours for refills to be completed.     To help prevent nausea and vomiting after your treatment, we encourage you to take your nausea medication as directed.  BELOW ARE SYMPTOMS THAT SHOULD BE REPORTED IMMEDIATELY: *FEVER GREATER THAN 100.4 F (38 C) OR HIGHER *CHILLS OR SWEATING *NAUSEA AND VOMITING THAT IS NOT CONTROLLED WITH YOUR NAUSEA MEDICATION *UNUSUAL SHORTNESS OF BREATH *UNUSUAL BRUISING OR BLEEDING *URINARY PROBLEMS (pain or burning when urinating, or frequent urination) *BOWEL PROBLEMS (unusual diarrhea, constipation, pain near the anus) TENDERNESS IN MOUTH AND THROAT WITH OR WITHOUT PRESENCE OF ULCERS (sore throat, sores in mouth, or a toothache) UNUSUAL RASH, SWELLING OR PAIN  UNUSUAL VAGINAL DISCHARGE OR ITCHING   Items with * indicate a potential emergency and should be followed up as soon as possible or go to the Emergency Department if any problems should occur.  Please show the CHEMOTHERAPY ALERT CARD or IMMUNOTHERAPY ALERT CARD at check-in to the Emergency Department and triage nurse.  Should you have questions after your visit or need to  cancel or reschedule your appointment, please contact [facility] [phone]  and follow the prompts.  Office hours are 8:00 a.m. to 4:30 p.m. Monday - Friday. Please note that voicemails left after 4:00 p.m. may not be returned until the following business day.  We are closed weekends and major holidays. You have access to a nurse at all times for urgent questions. Please call the main number to the clinic [phone] and follow the prompts.  For any non-urgent questions, you may also contact your provider using MyChart. We now offer e-Visits for anyone 18 and older to request care online for non-urgent symptoms. For details visit [email or web address].   Also download the MyChart app! Go to the app store, search "MyChart", open the app, select [facility], and log in with your MyChart username and password.  Masks are optional in the cancer centers. If you would like for your care team to wear a mask while they are taking care of you, please let them know. You may have one support person who is at least 79 years old accompany you for your appointments.  

## 2021-12-01 NOTE — Progress Notes (Signed)
Patient presents today for Xgeva injection.  Patient is in satisfactory condition with no complaints voiced. Vital signs are stable. Labs reviewed.  Potassium today is 3.1.  We will give Klor Con 40 mEq PO x one dose today per standing orders by Dr. Delton Coombes.  All other labs are within treatment parameters.  We will proceed with injection per MD orders.  Patient tolerated Xgeva injection with no complaints voiced.  Site clean and dry with no bruising or swelling noted.  No complaints of pain.  Discharged with vital signs stable and no signs or symptoms of distress noted.

## 2021-12-02 ENCOUNTER — Other Ambulatory Visit: Payer: Self-pay

## 2021-12-02 MED ORDER — LENALIDOMIDE 10 MG PO CAPS: 10.0000 mg | ORAL_CAPSULE | Freq: Every day | ORAL | 0 refills | Status: DC

## 2021-12-02 MED ORDER — TRAMADOL HCL 50 MG PO TABS: 50.0000 mg | ORAL_TABLET | Freq: Four times a day (QID) | ORAL | 0 refills | Status: DC | PRN

## 2021-12-02 NOTE — Telephone Encounter (Signed)
Chart reviewed. Revlimid refilled per last office note with Dr. Katragadda.  

## 2021-12-17 ENCOUNTER — Other Ambulatory Visit: Payer: Self-pay | Admitting: Internal Medicine

## 2021-12-17 DIAGNOSIS — C9000 Multiple myeloma not having achieved remission: Secondary | ICD-10-CM

## 2021-12-17 DIAGNOSIS — M170 Bilateral primary osteoarthritis of knee: Secondary | ICD-10-CM

## 2021-12-18 ENCOUNTER — Other Ambulatory Visit: Payer: Self-pay

## 2021-12-18 MED ORDER — LENALIDOMIDE 10 MG PO CAPS: 10.0000 mg | ORAL_CAPSULE | Freq: Every day | ORAL | 0 refills | Status: DC

## 2021-12-18 NOTE — Telephone Encounter (Signed)
Chart reviewed. Revlimid refilled per last office note with Dr. Katragadda.  

## 2021-12-22 ENCOUNTER — Inpatient Hospital Stay: Payer: Medicare Other | Attending: Hematology

## 2021-12-22 DIAGNOSIS — C9000 Multiple myeloma not having achieved remission: Secondary | ICD-10-CM | POA: Insufficient documentation

## 2021-12-22 LAB — CBC WITH DIFFERENTIAL/PLATELET
Abs Immature Granulocytes: 0.03 10*3/uL (ref 0.00–0.07)
Basophils Absolute: 0 10*3/uL (ref 0.0–0.1)
Basophils Relative: 1 %
Eosinophils Absolute: 0.1 10*3/uL (ref 0.0–0.5)
Eosinophils Relative: 2 %
HCT: 37.7 % (ref 36.0–46.0)
Hemoglobin: 12.1 g/dL (ref 12.0–15.0)
Immature Granulocytes: 1 %
Lymphocytes Relative: 57 %
Lymphs Abs: 3.3 10*3/uL (ref 0.7–4.0)
MCH: 30.9 pg (ref 26.0–34.0)
MCHC: 32.1 g/dL (ref 30.0–36.0)
MCV: 96.4 fL (ref 80.0–100.0)
Monocytes Absolute: 0.5 10*3/uL (ref 0.1–1.0)
Monocytes Relative: 9 %
Neutro Abs: 1.7 10*3/uL (ref 1.7–7.7)
Neutrophils Relative %: 30 %
Platelets: 181 10*3/uL (ref 150–400)
RBC: 3.91 MIL/uL (ref 3.87–5.11)
RDW: 16.1 % — ABNORMAL HIGH (ref 11.5–15.5)
WBC: 5.8 10*3/uL (ref 4.0–10.5)
nRBC: 0 % (ref 0.0–0.2)

## 2021-12-22 LAB — COMPREHENSIVE METABOLIC PANEL
ALT: 20 U/L (ref 0–44)
AST: 20 U/L (ref 15–41)
Albumin: 3.8 g/dL (ref 3.5–5.0)
Alkaline Phosphatase: 48 U/L (ref 38–126)
Anion gap: 8 (ref 5–15)
BUN: 19 mg/dL (ref 8–23)
CO2: 24 mmol/L (ref 22–32)
Calcium: 8.4 mg/dL — ABNORMAL LOW (ref 8.9–10.3)
Chloride: 107 mmol/L (ref 98–111)
Creatinine, Ser: 1.05 mg/dL — ABNORMAL HIGH (ref 0.44–1.00)
GFR, Estimated: 54 mL/min — ABNORMAL LOW (ref >60)
Glucose, Bld: 133 mg/dL — ABNORMAL HIGH (ref 70–99)
Potassium: 3.8 mmol/L (ref 3.5–5.1)
Sodium: 139 mmol/L (ref 135–145)
Total Bilirubin: 0.9 mg/dL (ref 0.3–1.2)
Total Protein: 7.5 g/dL (ref 6.5–8.1)

## 2021-12-22 LAB — LACTATE DEHYDROGENASE: LDH: 157 U/L (ref 98–192)

## 2021-12-22 LAB — MAGNESIUM: Magnesium: 1.7 mg/dL (ref 1.7–2.4)

## 2021-12-23 DIAGNOSIS — H04123 Dry eye syndrome of bilateral lacrimal glands: Secondary | ICD-10-CM | POA: Diagnosis not present

## 2021-12-23 DIAGNOSIS — H401131 Primary open-angle glaucoma, bilateral, mild stage: Secondary | ICD-10-CM | POA: Diagnosis not present

## 2021-12-23 DIAGNOSIS — H2513 Age-related nuclear cataract, bilateral: Secondary | ICD-10-CM | POA: Diagnosis not present

## 2021-12-23 DIAGNOSIS — E113293 Type 2 diabetes mellitus with mild nonproliferative diabetic retinopathy without macular edema, bilateral: Secondary | ICD-10-CM | POA: Diagnosis not present

## 2021-12-23 DIAGNOSIS — H35033 Hypertensive retinopathy, bilateral: Secondary | ICD-10-CM | POA: Diagnosis not present

## 2021-12-23 LAB — HM DIABETES EYE EXAM

## 2021-12-23 LAB — KAPPA/LAMBDA LIGHT CHAINS
Kappa free light chain: 56.7 mg/L — ABNORMAL HIGH (ref 3.3–19.4)
Kappa, lambda light chain ratio: 2.01 — ABNORMAL HIGH (ref 0.26–1.65)
Lambda free light chains: 28.2 mg/L — ABNORMAL HIGH (ref 5.7–26.3)

## 2021-12-25 LAB — PROTEIN ELECTROPHORESIS, SERUM
A/G Ratio: 1.2 (ref 0.7–1.7)
Albumin ELP: 3.9 g/dL (ref 2.9–4.4)
Alpha-1-Globulin: 0.3 g/dL (ref 0.0–0.4)
Alpha-2-Globulin: 0.7 g/dL (ref 0.4–1.0)
Beta Globulin: 1.1 g/dL (ref 0.7–1.3)
Gamma Globulin: 1.2 g/dL (ref 0.4–1.8)
Globulin, Total: 3.3 g/dL (ref 2.2–3.9)
M-Spike, %: 0.5 g/dL — ABNORMAL HIGH
Total Protein ELP: 7.2 g/dL (ref 6.0–8.5)

## 2021-12-29 ENCOUNTER — Inpatient Hospital Stay (HOSPITAL_BASED_OUTPATIENT_CLINIC_OR_DEPARTMENT_OTHER): Payer: Medicare Other | Admitting: Hematology

## 2021-12-29 ENCOUNTER — Inpatient Hospital Stay: Payer: Medicare Other

## 2021-12-29 VITALS — BP 163/88 | HR 62 | Temp 98.0°F | Resp 18

## 2021-12-29 DIAGNOSIS — C9000 Multiple myeloma not having achieved remission: Secondary | ICD-10-CM

## 2021-12-29 MED ORDER — DENOSUMAB 120 MG/1.7ML ~~LOC~~ SOLN
120.0000 mg | Freq: Once | SUBCUTANEOUS | Status: AC
  Administered 2021-12-29: 120 mg via SUBCUTANEOUS
  Filled 2021-12-29: qty 1.7

## 2021-12-29 NOTE — Progress Notes (Signed)
[place],  [id number]   CLINIC:  Medical Oncology/Hematology  PCP:  Lindell Spar, MD [address] / [place] [address]  [phone]  REASON FOR VISIT:  Follow-up for multiple myeloma & normocytic anemia  PRIOR THERAPY:  Velcade x 9 cycles from 10/29/2019 to 05/02/2020 Revlimid 2/3 weeks  CURRENT THERAPY: surveillance  INTERVAL HISTORY:  Kim Mills, a 81 y.o. female, seen for follow-up of multiple myeloma.  She has knee pains which are stable.  She complains of burning and discomfort on urination for the past 7 days.  Denies any fevers or chills.  Denies any new onset bone pains.  Tolerating Revlimid reasonably well.  REVIEW OF SYSTEMS:  Review of Systems  Genitourinary:  Positive for dysuria.   Musculoskeletal:  Positive for arthralgias (knees).  All other systems reviewed and are negative.   PAST MEDICAL/SURGICAL HISTORY:  Past Medical History:  Diagnosis Date   Arthritis    Cancer [facility])    Cataract    Chest pain    Associated with weakness and fatigue   Diabetes mellitus    A1c of 7.4 in 08/2010   GERD (gastroesophageal reflux disease)    Glaucoma    Hyperlipidemia    Lipid profile in 08/2010:190, 121, 48, 118; normal CBC and CMet   Hypertension    Lipid profile in 08/2010:190, 121, 48, 118; normal CBC and CMet   Hypothyroidism    Hypothyroidism    Obesity    Past Surgical History:  Procedure Laterality Date   CHOLECYSTECTOMY     COLONOSCOPY N/A 04/01/2014   RMR: Melanosis coli. colonic polyps removed as described above.    CYSTOSCOPY W/ URETERAL STENT PLACEMENT Right 09/13/2019   Procedure: CYSTOSCOPY WITH RIGHT  RETROGRADE PYELOGRAM; BLADDER BIOPSY;  Surgeon: Cleon Gustin, MD;  Location: [facility];  Service: Urology;  Laterality: Right;   LEFT HEART CATH AND CORONARY ANGIOGRAPHY N/A 10/07/2017   Procedure: LEFT HEART CATH AND CORONARY ANGIOGRAPHY;  Surgeon: Troy Sine, MD;  Location: [facility];  Service: Cardiovascular;  Laterality: N/A;   THYROIDECTOMY, PARTIAL      SOCIAL HISTORY:  Social History   Socioeconomic History   Marital status: Widowed    Spouse name: Not on file   Number of children: 7   Years of education: Not on file   Highest education level: Not on file  Occupational History    Employer: RETIRED  Tobacco Use   Smoking status: Never    Passive exposure: Yes   Smokeless tobacco: Never  Vaping Use   Vaping Use: Never used  Substance and Sexual Activity   Alcohol use: Never   Drug use: Never   Sexual activity: Not Currently  Other Topics Concern   Not on file  Social History Narrative   Daughter, Corky Sing, lives with and assists patient.    Social Determinants of Health   Financial Resource Strain: Low Risk  (11/05/2020)   Overall Financial Resource Strain (CARDIA)    Difficulty of Paying Living Expenses: Not hard at all  Food Insecurity: No Food Insecurity (11/05/2020)   Hunger Vital Sign    Worried About Running Out of Food in the Last Year: Never true    Ran Out of Food in the Last Year: Never true  Transportation Needs: No Transportation Needs (11/05/2020)   PRAPARE - Hydrologist (Medical): No    Lack of Transportation (Non-Medical): No  Physical Activity: Sufficiently Active (11/05/2020)   Exercise Vital Sign    Days of Exercise per Week: 5 days    Minutes of Exercise per Session: 30 min  Stress: No Stress Concern Present (11/05/2020)   [place]    Feeling of Stress : Not at all  Social Connections: Moderately Integrated (11/05/2020)   Social Connection and Isolation Panel [NHANES]    Frequency of Communication with Friends and Family: More than three times a week    Frequency of Social Gatherings with Friends and Family: More than three times a week    Attends Religious Services: 1 to 4 times per year    Active Member of [employer] or  Organizations: Yes    Attends Archivist Meetings: 1 to 4 times per year    Marital Status: Widowed  Intimate Partner Violence: Not At Risk (11/05/2020)   Humiliation, Afraid, Rape, and Kick questionnaire    Fear of Current or Ex-Partner: No    Emotionally Abused: No    Physically Abused: No    Sexually Abused: No    FAMILY HISTORY:  Family History  Problem Relation Age of Onset   Anemia Father    Arthritis Mother    Stroke Brother    Cancer Sister        unknown kind   Diabetes Sister    Dementia Sister    Cancer Niece        Breast   Cancer Brother    Healthy Son    Healthy Son    Healthy Son    Healthy Daughter    Healthy Daughter    Healthy Daughter    Sarcoidosis Daughter     CURRENT MEDICATIONS:  Current Outpatient Medications  Medication Sig Dispense Refill   albuterol (VENTOLIN HFA) 108 (90 Base) MCG/ACT inhaler Inhale 2 puffs into the lungs every 6 (six) hours as needed for wheezing or shortness of breath. 8 g 0   ASPIRIN 81 PO Take 1 tablet by mouth daily.     blood glucose meter kit and supplies Dispense based on patient and insurance preference. Use up to four times daily as directed. (FOR ICD-10 E10.9, E11.9). 1 each 0   Blood Glucose Monitoring Suppl (BLOOD GLUCOSE SYSTEM PAK) KIT Please dispense based on patient and insurance preference. Use as directed to monitor FSBS 2x daily. Dx: E11.9. 1 each 1   Calcium 500 MG tablet Take 600 mg by mouth 2 (two) times daily.     fluticasone (FLONASE) 50 MCG/ACT nasal spray Place 2 sprays into both nostrils daily. 16 g 6   gabapentin (NEURONTIN) 300 MG capsule Take 1 capsule (300 mg total) by mouth 2 (two) times daily. Dose/frequency change 180 capsule 3   glipiZIDE (GLUCOTROL) 5 MG tablet Take 1 tablet (5 mg total) by mouth 2 (two) times daily before a meal. 90 tablet 1   Glucose Blood (BLOOD GLUCOSE TEST STRIPS) STRP Please dispense based on patient and insurance preference. Use as directed to monitor FSBS 2x  daily. Dx: E11.9. 100 each 11   guaiFENesin-dextromethorphan (ROBITUSSIN DM) 100-10 MG/5ML syrup Take 5 mLs by mouth every 4 (four) hours as needed for cough. 118 mL 0   HM LORATADINE 10 MG tablet TAKE ONE TABLET (10MG TOTAL) BY MOUTH DAILY (Patient taking differently: Take 10 mg by mouth daily as needed for allergies.) 30 tablet 0   HYDROcodone-acetaminophen (NORCO/VICODIN) 5-325 MG tablet TAKE ONE (1) TABLET BY MOUTH EVERY 12  HOURS AS NEEDED FOR MODERATE PAIN 60 tablet 0   Lancets MISC Please dispense based on patient and insurance preference. Use as directed to monitor FSBS 2x daily. Dx: E11.9. 100 each 11   lenalidomide (REVLIMID) 10 MG capsule Take 1 capsule (10 mg total) by mouth daily. 14 days on, 7 days off 14 capsule 0   losartan (COZAAR) 25 MG tablet Take 1 tablet (25 mg total) by mouth daily. 90 tablet 1   magic mouthwash w/lidocaine SOLN Take 5 mLs by mouth 4 (four) times daily. 450 mL 0   magnesium oxide (MAG-OX) 400 (240 Mg) MG tablet Take 1 tablet (400 mg total) by mouth 3 (three) times daily. 90 tablet 6   meclizine (ANTIVERT) 25 MG tablet Take 25 mg by mouth every 6 (six) hours as needed for dizziness.     metFORMIN (GLUCOPHAGE) 1000 MG tablet TAKE ONE TABLET BY MOUTH TWICE A DAY 60 tablet 3   metoprolol tartrate (LOPRESSOR) 25 MG tablet TAKE ONE-HALF TABLET (12.5MG TOTAL) BY MOUTH TWO TIMES DAILY 90 tablet 3   omeprazole (PRILOSEC) 40 MG capsule TAKE ONE CAPSULE BY MOUTH DAILY 90 capsule 3   polyethylene glycol (MIRALAX / GLYCOLAX) 17 g packet Take 17 g by mouth daily. 14 each 0   potassium chloride (KLOR-CON M) 10 MEQ tablet Take 2 tablets (20 mEq total) by mouth daily. 60 tablet 2   simvastatin (ZOCOR) 40 MG tablet TAKE ONE TABLET (40MG TOTAL) BY MOUTH BEDTIME 90 tablet 3   SYNTHROID 137 MCG tablet TAKE ONE TABLET (137MCG TOTAL) BY MOUTH DAILY BEFORE BREAKFAST 90 tablet 1   traMADol (ULTRAM) 50 MG tablet Take 1 tablet (50 mg total) by mouth every 6 (six) hours as needed. 120  tablet 0   zinc oxide (MEIJER ZINC OXIDE) 20 % ointment Apply 1 application topically as needed for irritation. 56.7 g 0   No current facility-administered medications for this visit.    ALLERGIES:  No Known Allergies  PHYSICAL EXAM:  Performance status (ECOG): 3 - Symptomatic, >50% confined to bed  There were no vitals filed for this visit.  Wt Readings from Last 3 Encounters:  11/03/21 155 lb (70.3 kg)  09/29/21 175 lb (79.4 kg)  07/08/21 181 lb 8 oz (82.3 kg)   Physical Exam Vitals reviewed.  Constitutional:      Appearance: Normal appearance.     Comments: In wheelchair  Cardiovascular:     Rate and Rhythm: Normal rate and regular rhythm.     Pulses: Normal pulses.     Heart sounds: Normal heart sounds.  Pulmonary:     Effort: Pulmonary effort is normal.     Breath sounds: Normal breath sounds.  Neurological:     General: No focal deficit present.     Mental Status: She is alert and oriented to person, place, and time.  Psychiatric:        Mood and Affect: Mood normal.        Behavior: Behavior normal.     LABORATORY DATA:  I have reviewed the labs as listed.     Latest Ref Rng & Units 12/22/2021   11:34 AM 10/28/2021   10:57 AM 09/30/2021   11:15 AM  CBC  WBC 4.0 - 10.5 K/uL 5.8  5.5  8.1   Hemoglobin 12.0 - 15.0 g/dL 12.1  11.9  11.9   Hematocrit 36.0 - 46.0 % 37.7  37.0  37.6   Platelets 150 - 400 K/uL 181  218  174  Latest Ref Rng & Units 12/22/2021   11:34 AM 12/01/2021   11:28 AM 10/28/2021   10:57 AM  CMP  Glucose 70 - 99 mg/dL 133  157  109   BUN 8 - 23 mg/dL _0 Creatinine 0.44 - 1.00 mg/dL 1.05  1.19  0.94   Sodium 135 - 145 mmol/L 139  139  138   Potassium 3.5 - 5.1 mmol/L 3.8  3.1  3.4   Chloride 98 - 111 mmol/L 107  105  107   CO2 22 - 32 mmol/L _1 Calcium 8.9 - 10.3 mg/dL 8.4  8.6  8.4   Total Protein 6.5 - 8.1 g/dL 7.5  7.6  7.2   Total Bilirubin 0.3 - 1.2 mg/dL 0.9  1.0  0.9   Alkaline Phos 38 - 126 U/L 48   60  67   AST 15 - 41 U/L 20  36  17   ALT 0 - 44 U/L 20  23  32       Component Value Date/Time   RBC 3.91 12/22/2021 1134   MCV 96.4 12/22/2021 1134   MCH 30.9 12/22/2021 1134   MCHC 32.1 12/22/2021 1134   RDW 16.1 (H) 12/22/2021 1134   LYMPHSABS 3.3 12/22/2021 1134   MONOABS 0.5 12/22/2021 1134   EOSABS 0.1 12/22/2021 1134   BASOSABS 0.0 12/22/2021 1134    DIAGNOSTIC IMAGING:  I have independently reviewed the scans and discussed with the patient. No results found.   ASSESSMENT:  1.  Normocytic anemia: -Patient seen at the request of Dr. Buelah Manis for further work-up and management of normocytic anemia. -Recent CBC on 08/21/2019 shows hemoglobin 6.9 with MCV of 88.3.  White count and platelets are normal.  No history of CKD.  Denies any bleeding per rectum or melena. Last colonoscopy on 04/01/2014 shows diffusely pigmented rectal mucosa consistent with melanosis [name]  Diffusely pigmented colonic mucosa, 2 diminutive polyps in the mid ascending segment, otherwise the remainder of the colon mucosa was normal.  Biopsy consistent with tubular adenoma. -CT renal study on 05/11/2019 shows normal-sized spleen with normal liver.  No other abnormal adenopathy. -Denies any prior history of blood transfusion.  She is currently taking iron tablet twice daily.   2.  IgG lambda plasma cell myeloma: -Skeletal survey on 09/12/2019 shows diffuse faint lucencies in the skull, bilateral femurs. -Bone marrow biopsy on 09/27/2019 shows 70% plasma cells in hypercellular marrow.  Chromosome analysis was 84, XX. FISH panel was normal.  However quality of specimen is compromised. -LDH normal, beta-2 microglobulin 2.9.  M spike was 2.1 g.  Kappa light chains 19.7, lambda light chains 14.3 with ratio of 1.38. -24-hour urine shows nonnephrotic range proteinuria.  Urine immunofixation was positive. - 9 cycles of RVD from 10/29/2019 through 05/02/2020. - Because of her age and comorbidities, she  was not considered a candidate for bone marrow transplant. - Maintenance Revlimid was held due to severe weakness. - Revlimid 10 mg 2 weeks on/1 week off restarted back on 06/03/2021 due to worsening M spike.   PLAN:  1.  IgG lambda plasma cell myeloma: - She is taking Revlimid 10 mg 2 weeks on/1 week off. - Reviewed myeloma panel (12/22/2021): M spike stable at 0.5 g.  Free kappa light chains are 56, lambda light chains 28, ratio of 2.01 which is slightly high from before.  CBC was grossly normal.  Renal function is stable  at 1.05 and calcium normal. - I recommend continuing Revlimid 10 mg 2 weeks on/1 week off. - RTC 2 months with repeat myeloma labs. - For the urinary symptoms, I have suggested checking UA.  She could not urinate in our office today.  She will bring his back the sample tomorrow.     2.  Hypomagnesemia: - Continue magnesium 3 times daily.  Magnesium is normal today.   3.  Bilateral knee pains: - Continue tramadol 3 to 4 tablets daily as needed.   4.  Bone protection: - Continue denosumab monthly.  Calcium is 8.4.  5.  Hypokalemia: - Continue potassium supplements.  Potassium is normal.  Orders placed this encounter:  Orders Placed This Encounter  Procedures   CBC with Differential/Platelet   Comprehensive metabolic panel   Protein electrophoresis, serum   Kappa/lambda light chains      Derek Jack, MD [place] [phone]

## 2021-12-29 NOTE — Patient Instructions (Signed)
[facility]  Discharge Instructions: Thank you for choosing [facility] to provide your oncology and hematology care.  If you have a lab appointment with the [facility], please come in thru the Main Entrance and check in at the main information desk.  Wear comfortable clothing and clothing appropriate for easy access to any Portacath or PICC line.   We strive to give you quality time with your provider. You may need to reschedule your appointment if you arrive late (15 or more minutes).  Arriving late affects you and other patients whose appointments are after yours.  Also, if you miss three or more appointments without notifying the office, you may be dismissed from the clinic at the provider's discretion.      For prescription refill requests, have your pharmacy contact our office and allow 72 hours for refills to be completed.    Today you received the following Xgeva, return as scheduled.   To help prevent nausea and vomiting after your treatment, we encourage you to take your nausea medication as directed.  BELOW ARE SYMPTOMS THAT SHOULD BE REPORTED IMMEDIATELY: *FEVER GREATER THAN 100.4 F (38 C) OR HIGHER *CHILLS OR SWEATING *NAUSEA AND VOMITING THAT IS NOT CONTROLLED WITH YOUR NAUSEA MEDICATION *UNUSUAL SHORTNESS OF BREATH *UNUSUAL BRUISING OR BLEEDING *URINARY PROBLEMS (pain or burning when urinating, or frequent urination) *BOWEL PROBLEMS (unusual diarrhea, constipation, pain near the anus) TENDERNESS IN MOUTH AND THROAT WITH OR WITHOUT PRESENCE OF ULCERS (sore throat, sores in mouth, or a toothache) UNUSUAL RASH, SWELLING OR PAIN  UNUSUAL VAGINAL DISCHARGE OR ITCHING   Items with * indicate a potential emergency and should be followed up as soon as possible or go to the Emergency Department if any problems should occur.  Please show the CHEMOTHERAPY ALERT CARD or IMMUNOTHERAPY ALERT CARD at check-in to the Emergency Department and triage  nurse.  Should you have questions after your visit or need to cancel or reschedule your appointment, please contact [facility] [phone]  and follow the prompts.  Office hours are 8:00 a.m. to 4:30 p.m. Monday - Friday. Please note that voicemails left after 4:00 p.m. may not be returned until the following business day.  We are closed weekends and major holidays. You have access to a nurse at all times for urgent questions. Please call the main number to the clinic [phone] and follow the prompts.  For any non-urgent questions, you may also contact your provider using MyChart. We now offer e-Visits for anyone 11 and older to request care online for non-urgent symptoms. For details visit [email or web address].   Also download the MyChart app! Go to the app store, search "MyChart", open the app, select [facility], and log in with your MyChart username and password.  Masks are optional in the cancer centers. If you would like for your care team to wear a mask while they are taking care of you, please let them know. You may have one support person who is at least 79 years old accompany you for your appointments.

## 2021-12-29 NOTE — Progress Notes (Signed)
Patient taking calcium as directed. Denied tooth, jaw, and leg pain. No recent or upcoming dental visits. Labs reviewed. Patient tolerated injection with no complaints voiced. See MAR for details. Patient stable during and after injection. Site clean and dry with no bruising or swelling noted. Band aid applied. Vss with discharge and left in satisfactory condition with no s/s of distress.  

## 2021-12-29 NOTE — Patient Instructions (Addendum)
[facility]  Discharge Instructions  You were seen and examined today by Dr. Delton Coombes.  Dr. Delton Coombes discussed your most recent lab work which revealed that everything looks ok.  Follow-up as scheduled in 2 months.    Thank you for choosing [facility] to provide your oncology and hematology care.   To afford each patient quality time with our provider, please arrive at least 15 minutes before your scheduled appointment time. You may need to reschedule your appointment if you arrive late (10 or more minutes). Arriving late affects you and other patients whose appointments are after yours.  Also, if you miss three or more appointments without notifying the office, you may be dismissed from the clinic at the provider's discretion.    Again, thank you for choosing [facility].  Our hope is that these requests will decrease the amount of time that you wait before being seen by our physicians.   If you have a lab appointment with the [facility] please come in thru the Main Entrance and check in at the main information desk.           _____________________________________________________________  Should you have questions after your visit to [facility], please contact our office at [phone] and follow the prompts.  Our office hours are 8:00 a.m. to 4:30 p.m. Monday - Thursday and 8:00 a.m. to 2:30 p.m. Friday.  Please note that voicemails left after 4:00 p.m. may not be returned until the following business day.  We are closed weekends and all major holidays.  You do have access to a nurse 24-7, just call the main number to the clinic [phone] and do not press any options, hold on the line and a nurse will answer the phone.    For prescription refill requests, have your pharmacy contact our office and allow 72 hours.    Masks are optional in the cancer centers. If you would like for your care team  to wear a mask while they are taking care of you, please let them know. You may have one support person who is at least 79 years old accompany you for your appointments.

## 2022-01-04 ENCOUNTER — Other Ambulatory Visit (HOSPITAL_COMMUNITY)
Admission: RE | Admit: 2022-01-04 | Discharge: 2022-01-04 | Disposition: A | Discharge status: Home / Self Care | Payer: Medicare Other | Source: Ambulatory Visit | Attending: Hematology | Admitting: Hematology

## 2022-01-04 ENCOUNTER — Other Ambulatory Visit: Payer: Self-pay

## 2022-01-04 DIAGNOSIS — C9000 Multiple myeloma not having achieved remission: Secondary | ICD-10-CM | POA: Insufficient documentation

## 2022-01-04 DIAGNOSIS — R300 Dysuria: Secondary | ICD-10-CM | POA: Insufficient documentation

## 2022-01-04 LAB — URINALYSIS, DIPSTICK ONLY
Bilirubin Urine: NEGATIVE
Glucose, UA: NEGATIVE mg/dL
Ketones, ur: NEGATIVE mg/dL
Nitrite: POSITIVE — AB
Protein, ur: 100 mg/dL — AB
Specific Gravity, Urine: 1.023 (ref 1.005–1.030)
pH: 5 (ref 5.0–8.0)

## 2022-01-05 ENCOUNTER — Ambulatory Visit: Payer: Medicare Other | Admitting: Internal Medicine

## 2022-01-06 ENCOUNTER — Other Ambulatory Visit: Payer: Self-pay

## 2022-01-06 LAB — URINE CULTURE: Culture: >=100000 — AB

## 2022-01-06 MED ORDER — LENALIDOMIDE 10 MG PO CAPS: 10.0000 mg | ORAL_CAPSULE | Freq: Every day | ORAL | 0 refills | Status: DC

## 2022-01-06 NOTE — Telephone Encounter (Signed)
Chart reviewed. Revlimid refilled per last office note with Dr. Katragadda.  

## 2022-01-07 ENCOUNTER — Other Ambulatory Visit: Payer: Self-pay | Admitting: *Deleted

## 2022-01-07 MED ORDER — CIPROFLOXACIN HCL 500 MG PO TABS: 500.0000 mg | ORAL_TABLET | Freq: Two times a day (BID) | ORAL | 0 refills | Status: AC

## 2022-01-07 NOTE — Telephone Encounter (Signed)
After review of urine culture, Dr. Delton Coombes has sent Cipro 500 mg bid x 5 days.  Patient aware.

## 2022-01-07 NOTE — Progress Notes (Signed)
Cipro 500 mg bid x 5 days sent to [facility] per Dr. Delton Coombes.

## 2022-01-13 ENCOUNTER — Other Ambulatory Visit: Payer: Self-pay | Admitting: Internal Medicine

## 2022-01-13 DIAGNOSIS — M170 Bilateral primary osteoarthritis of knee: Secondary | ICD-10-CM

## 2022-01-13 DIAGNOSIS — C9000 Multiple myeloma not having achieved remission: Secondary | ICD-10-CM

## 2022-01-15 ENCOUNTER — Other Ambulatory Visit: Payer: Self-pay | Admitting: Hematology

## 2022-01-23 ENCOUNTER — Other Ambulatory Visit: Payer: Self-pay | Admitting: Family Medicine

## 2022-01-23 ENCOUNTER — Other Ambulatory Visit: Payer: Self-pay | Admitting: Internal Medicine

## 2022-01-23 DIAGNOSIS — E1140 Type 2 diabetes mellitus with diabetic neuropathy, unspecified: Secondary | ICD-10-CM

## 2022-01-25 ENCOUNTER — Inpatient Hospital Stay: Payer: Medicare Other | Attending: Hematology

## 2022-01-25 ENCOUNTER — Inpatient Hospital Stay: Payer: Medicare Other

## 2022-01-25 ENCOUNTER — Other Ambulatory Visit: Payer: Self-pay

## 2022-01-25 VITALS — BP 159/58 | HR 67 | Temp 98.0°F | Resp 16

## 2022-01-25 DIAGNOSIS — C9000 Multiple myeloma not having achieved remission: Secondary | ICD-10-CM | POA: Insufficient documentation

## 2022-01-25 LAB — COMPREHENSIVE METABOLIC PANEL
ALT: 49 U/L — ABNORMAL HIGH (ref 0–44)
AST: 18 U/L (ref 15–41)
Albumin: 3.7 g/dL (ref 3.5–5.0)
Alkaline Phosphatase: 80 U/L (ref 38–126)
Anion gap: 7 (ref 5–15)
BUN: 20 mg/dL (ref 8–23)
CO2: 24 mmol/L (ref 22–32)
Calcium: 8.2 mg/dL — ABNORMAL LOW (ref 8.9–10.3)
Chloride: 109 mmol/L (ref 98–111)
Creatinine, Ser: 1.14 mg/dL — ABNORMAL HIGH (ref 0.44–1.00)
GFR, Estimated: 49 mL/min — ABNORMAL LOW (ref >60)
Glucose, Bld: 160 mg/dL — ABNORMAL HIGH (ref 70–99)
Potassium: 3.8 mmol/L (ref 3.5–5.1)
Sodium: 140 mmol/L (ref 135–145)
Total Bilirubin: 0.6 mg/dL (ref 0.3–1.2)
Total Protein: 7.3 g/dL (ref 6.5–8.1)

## 2022-01-25 MED ORDER — LENALIDOMIDE 10 MG PO CAPS: 10.0000 mg | ORAL_CAPSULE | Freq: Every day | ORAL | 0 refills | Status: DC

## 2022-01-25 MED ORDER — DENOSUMAB 120 MG/1.7ML ~~LOC~~ SOLN
120.0000 mg | Freq: Once | SUBCUTANEOUS | Status: AC
  Administered 2022-01-25: 120 mg via SUBCUTANEOUS
  Filled 2022-01-25: qty 1.7

## 2022-01-25 NOTE — Telephone Encounter (Signed)
Chart reviewed. Revlimid refilled per last office note with Dr. Katragadda.  

## 2022-01-25 NOTE — Patient Instructions (Signed)
[facility]  Discharge Instructions: Thank you for choosing [facility] to provide your oncology and hematology care.  If you have a lab appointment with the [facility], please come in thru the Main Entrance and check in at the main information desk.  Wear comfortable clothing and clothing appropriate for easy access to any Portacath or PICC line.   We strive to give you quality time with your provider. You may need to reschedule your appointment if you arrive late (15 or more minutes).  Arriving late affects you and other patients whose appointments are after yours.  Also, if you miss three or more appointments without notifying the office, you may be dismissed from the clinic at the provider's discretion.      For prescription refill requests, have your pharmacy contact our office and allow 72 hours for refills to be completed.    Today you received the following Xgeva, return as scheduled.   To help prevent nausea and vomiting after your treatment, we encourage you to take your nausea medication as directed.  BELOW ARE SYMPTOMS THAT SHOULD BE REPORTED IMMEDIATELY: *FEVER GREATER THAN 100.4 F (38 C) OR HIGHER *CHILLS OR SWEATING *NAUSEA AND VOMITING THAT IS NOT CONTROLLED WITH YOUR NAUSEA MEDICATION *UNUSUAL SHORTNESS OF BREATH *UNUSUAL BRUISING OR BLEEDING *URINARY PROBLEMS (pain or burning when urinating, or frequent urination) *BOWEL PROBLEMS (unusual diarrhea, constipation, pain near the anus) TENDERNESS IN MOUTH AND THROAT WITH OR WITHOUT PRESENCE OF ULCERS (sore throat, sores in mouth, or a toothache) UNUSUAL RASH, SWELLING OR PAIN  UNUSUAL VAGINAL DISCHARGE OR ITCHING   Items with * indicate a potential emergency and should be followed up as soon as possible or go to the Emergency Department if any problems should occur.  Please show the CHEMOTHERAPY ALERT CARD or IMMUNOTHERAPY ALERT CARD at check-in to the Emergency Department and triage  nurse.  Should you have questions after your visit or need to cancel or reschedule your appointment, please contact [facility] [phone]  and follow the prompts.  Office hours are 8:00 a.m. to 4:30 p.m. Monday - Friday. Please note that voicemails left after 4:00 p.m. may not be returned until the following business day.  We are closed weekends and major holidays. You have access to a nurse at all times for urgent questions. Please call the main number to the clinic [phone] and follow the prompts.  For any non-urgent questions, you may also contact your provider using MyChart. We now offer e-Visits for anyone 83 and older to request care online for non-urgent symptoms. For details visit [email or web address].   Also download the MyChart app! Go to the app store, search "MyChart", open the app, select [facility], and log in with your MyChart username and password.  Masks are optional in the cancer centers. If you would like for your care team to wear a mask while they are taking care of you, please let them know. You may have one support person who is at least 79 years old accompany you for your appointments.

## 2022-01-25 NOTE — Progress Notes (Signed)
Patient taking calcium as directed. Denied tooth, jaw, and leg pain. No recent or upcoming dental visits. Labs reviewed. Patient tolerated injection with no complaints voiced. See MAR for details. Patient stable during and after injection. Site clean and dry with no bruising or swelling noted. Band aid applied. Vss with discharge and left in satisfactory condition with no s/s of distress.  

## 2022-01-26 ENCOUNTER — Inpatient Hospital Stay: Payer: Medicare Other

## 2022-02-11 ENCOUNTER — Other Ambulatory Visit: Payer: Self-pay

## 2022-02-11 MED ORDER — LENALIDOMIDE 10 MG PO CAPS: 10.0000 mg | ORAL_CAPSULE | Freq: Every day | ORAL | 0 refills | Status: DC

## 2022-02-11 NOTE — Telephone Encounter (Signed)
Chart reviewed. Revlimid refilled per last office note with Dr. Katragadda.  

## 2022-02-17 ENCOUNTER — Inpatient Hospital Stay: Payer: Medicare Other

## 2022-02-22 ENCOUNTER — Inpatient Hospital Stay: Payer: Medicare Other | Attending: Hematology

## 2022-02-22 DIAGNOSIS — C9000 Multiple myeloma not having achieved remission: Secondary | ICD-10-CM | POA: Diagnosis not present

## 2022-02-22 LAB — CBC WITH DIFFERENTIAL/PLATELET
Abs Immature Granulocytes: 0.05 10*3/uL (ref 0.00–0.07)
Basophils Absolute: 0 10*3/uL (ref 0.0–0.1)
Basophils Relative: 1 %
Eosinophils Absolute: 0.2 10*3/uL (ref 0.0–0.5)
Eosinophils Relative: 4 %
HCT: 35.7 % — ABNORMAL LOW (ref 36.0–46.0)
Hemoglobin: 11.2 g/dL — ABNORMAL LOW (ref 12.0–15.0)
Immature Granulocytes: 1 %
Lymphocytes Relative: 46 %
Lymphs Abs: 2.3 10*3/uL (ref 0.7–4.0)
MCH: 31.2 pg (ref 26.0–34.0)
MCHC: 31.4 g/dL (ref 30.0–36.0)
MCV: 99.4 fL (ref 80.0–100.0)
Monocytes Absolute: 0.4 10*3/uL (ref 0.1–1.0)
Monocytes Relative: 7 %
Neutro Abs: 2 10*3/uL (ref 1.7–7.7)
Neutrophils Relative %: 41 %
Platelets: 162 10*3/uL (ref 150–400)
RBC: 3.59 MIL/uL — ABNORMAL LOW (ref 3.87–5.11)
RDW: 15.2 % (ref 11.5–15.5)
WBC: 4.9 10*3/uL (ref 4.0–10.5)
nRBC: 0.4 % — ABNORMAL HIGH (ref 0.0–0.2)

## 2022-02-22 LAB — COMPREHENSIVE METABOLIC PANEL
ALT: 11 U/L (ref 0–44)
AST: 16 U/L (ref 15–41)
Albumin: 3.4 g/dL — ABNORMAL LOW (ref 3.5–5.0)
Alkaline Phosphatase: 48 U/L (ref 38–126)
Anion gap: 11 (ref 5–15)
BUN: 15 mg/dL (ref 8–23)
CO2: 23 mmol/L (ref 22–32)
Calcium: 8.3 mg/dL — ABNORMAL LOW (ref 8.9–10.3)
Chloride: 106 mmol/L (ref 98–111)
Creatinine, Ser: 1.02 mg/dL — ABNORMAL HIGH (ref 0.44–1.00)
GFR, Estimated: 56 mL/min — ABNORMAL LOW (ref >60)
Glucose, Bld: 136 mg/dL — ABNORMAL HIGH (ref 70–99)
Potassium: 3.9 mmol/L (ref 3.5–5.1)
Sodium: 140 mmol/L (ref 135–145)
Total Bilirubin: 0.6 mg/dL (ref 0.3–1.2)
Total Protein: 7 g/dL (ref 6.5–8.1)

## 2022-02-23 LAB — KAPPA/LAMBDA LIGHT CHAINS
Kappa free light chain: 51.1 mg/L — ABNORMAL HIGH (ref 3.3–19.4)
Kappa, lambda light chain ratio: 1.86 — ABNORMAL HIGH (ref 0.26–1.65)
Lambda free light chains: 27.5 mg/L — ABNORMAL HIGH (ref 5.7–26.3)

## 2022-02-24 ENCOUNTER — Other Ambulatory Visit: Payer: Self-pay | Admitting: Hematology

## 2022-02-24 ENCOUNTER — Inpatient Hospital Stay: Payer: Medicare Other | Admitting: Hematology

## 2022-02-24 ENCOUNTER — Inpatient Hospital Stay: Payer: Medicare Other

## 2022-02-24 ENCOUNTER — Other Ambulatory Visit: Payer: Self-pay | Admitting: Internal Medicine

## 2022-02-24 DIAGNOSIS — M170 Bilateral primary osteoarthritis of knee: Secondary | ICD-10-CM

## 2022-02-24 DIAGNOSIS — C9000 Multiple myeloma not having achieved remission: Secondary | ICD-10-CM

## 2022-02-25 LAB — PROTEIN ELECTROPHORESIS, SERUM
A/G Ratio: 1.1 (ref 0.7–1.7)
Albumin ELP: 3.4 g/dL (ref 2.9–4.4)
Alpha-1-Globulin: 0.3 g/dL (ref 0.0–0.4)
Alpha-2-Globulin: 0.6 g/dL (ref 0.4–1.0)
Beta Globulin: 1.1 g/dL (ref 0.7–1.3)
Gamma Globulin: 1 g/dL (ref 0.4–1.8)
Globulin, Total: 3 g/dL (ref 2.2–3.9)
M-Spike, %: 0.6 g/dL — ABNORMAL HIGH
Total Protein ELP: 6.4 g/dL (ref 6.0–8.5)

## 2022-03-01 ENCOUNTER — Inpatient Hospital Stay: Payer: Medicare Other

## 2022-03-01 ENCOUNTER — Encounter: Payer: Self-pay | Admitting: Hematology

## 2022-03-01 ENCOUNTER — Inpatient Hospital Stay (HOSPITAL_BASED_OUTPATIENT_CLINIC_OR_DEPARTMENT_OTHER): Payer: Medicare Other | Admitting: Hematology

## 2022-03-01 VITALS — BP 95/83 | HR 57 | Temp 99.1°F | Resp 18 | Wt 166.0 lb

## 2022-03-01 DIAGNOSIS — C9000 Multiple myeloma not having achieved remission: Secondary | ICD-10-CM | POA: Diagnosis not present

## 2022-03-01 MED ORDER — DEXAMETHASONE 4 MG PO TABS: 20.0000 mg | ORAL_TABLET | ORAL | 5 refills | Status: DC

## 2022-03-01 MED ORDER — DENOSUMAB 120 MG/1.7ML ~~LOC~~ SOLN
120.0000 mg | Freq: Once | SUBCUTANEOUS | Status: AC
  Administered 2022-03-01: 120 mg via SUBCUTANEOUS
  Filled 2022-03-01: qty 1.7

## 2022-03-01 NOTE — Progress Notes (Signed)
Kim Mills presents today for Xgeva injection per the provider's orders.    Message received from Anastasio Champion RN/Dr. Delton Coombes patient okay for injection.  Stable during administration without incident; injection site WNL; see MAR for injection details.  Patient tolerated procedure well and without incident.  No questions or complaints noted at this time.

## 2022-03-01 NOTE — Patient Instructions (Addendum)
[facility] at [facility] Discharge Instructions   You were seen and examined today by Dr. Delton Coombes.  He reviewed the results of your lab work. Your myeloma lab numbers have gone up slightly. Dr. Raliegh Ip would like to add a steroid to your treatment. It is called dexamethasone. You take these pills once a week.   We will proceed with your Xgeva injection today.   Continue Revlimid as prescribed.   Return as scheduled.   Thank you for choosing [place] at [facility] to provide your oncology and hematology care.  To afford each patient quality time with our provider, please arrive at least 15 minutes before your scheduled appointment time.   If you have a lab appointment with the [facility] please come in thru the Main Entrance and check in at the main information desk.  You need to re-schedule your appointment should you arrive 10 or more minutes late.  We strive to give you quality time with our providers, and arriving late affects you and other patients whose appointments are after yours.  Also, if you no show three or more times for appointments you may be dismissed from the clinic at the providers discretion.     Again, thank you for choosing [facility].  Our hope is that these requests will decrease the amount of time that you wait before being seen by our physicians.       _____________________________________________________________  Should you have questions after your visit to [facility], please contact our office at [phone] and follow the prompts.  Our office hours are 8:00 a.m. and 4:30 p.m. Monday - Friday.  Please note that voicemails left after 4:00 p.m. may not be returned until the following business day.  We are closed weekends and major holidays.  You do have access to a nurse 24-7, just call the main number to the clinic [phone] and do not press any options, hold on the line and a nurse  will answer the phone.    For prescription refill requests, have your pharmacy contact our office and allow 72 hours.    Due to Covid, you will need to wear a mask upon entering the hospital. If you do not have a mask, a mask will be given to you at the Main Entrance upon arrival. For doctor visits, patients may have 1 support person age 75 or older with them. For treatment visits, patients can not have anyone with them due to social distancing guidelines and our immunocompromised population.

## 2022-03-01 NOTE — Patient Instructions (Signed)
[facility]  Discharge Instructions: Thank you for choosing [facility] to provide your oncology and hematology care.  If you have a lab appointment with the [facility], please come in thru the Main Entrance and check in at the main information desk.  Wear comfortable clothing and clothing appropriate for easy access to any Portacath or PICC line.   We strive to give you quality time with your provider. You may need to reschedule your appointment if you arrive late (15 or more minutes).  Arriving late affects you and other patients whose appointments are after yours.  Also, if you miss three or more appointments without notifying the office, you may be dismissed from the clinic at the provider's discretion.      For prescription refill requests, have your pharmacy contact our office and allow 72 hours for refills to be completed.    Today you received the following chemotherapy and/or immunotherapy agents Xgeva      To help prevent nausea and vomiting after your treatment, we encourage you to take your nausea medication as directed.  BELOW ARE SYMPTOMS THAT SHOULD BE REPORTED IMMEDIATELY: *FEVER GREATER THAN 100.4 F (38 C) OR HIGHER *CHILLS OR SWEATING *NAUSEA AND VOMITING THAT IS NOT CONTROLLED WITH YOUR NAUSEA MEDICATION *UNUSUAL SHORTNESS OF BREATH *UNUSUAL BRUISING OR BLEEDING *URINARY PROBLEMS (pain or burning when urinating, or frequent urination) *BOWEL PROBLEMS (unusual diarrhea, constipation, pain near the anus) TENDERNESS IN MOUTH AND THROAT WITH OR WITHOUT PRESENCE OF ULCERS (sore throat, sores in mouth, or a toothache) UNUSUAL RASH, SWELLING OR PAIN  UNUSUAL VAGINAL DISCHARGE OR ITCHING   Items with * indicate a potential emergency and should be followed up as soon as possible or go to the Emergency Department if any problems should occur.  Please show the CHEMOTHERAPY ALERT CARD or IMMUNOTHERAPY ALERT CARD at check-in to the Emergency  Department and triage nurse.  Should you have questions after your visit or need to cancel or reschedule your appointment, please contact [facility] [phone]  and follow the prompts.  Office hours are 8:00 a.m. to 4:30 p.m. Monday - Friday. Please note that voicemails left after 4:00 p.m. may not be returned until the following business day.  We are closed weekends and major holidays. You have access to a nurse at all times for urgent questions. Please call the main number to the clinic [phone] and follow the prompts.  For any non-urgent questions, you may also contact your provider using MyChart. We now offer e-Visits for anyone 26 and older to request care online for non-urgent symptoms. For details visit [email or web address].   Also download the MyChart app! Go to the app store, search "MyChart", open the app, select [facility], and log in with your MyChart username and password.

## 2022-03-01 NOTE — Progress Notes (Signed)
[place], [place] [address]   CLINIC:  Medical Oncology/Hematology  PCP:  Lindell Spar, MD [address] / [place] [address]  [phone]  REASON FOR VISIT:  Follow-up for multiple myeloma & normocytic anemia  PRIOR THERAPY:  Velcade x 9 cycles from 10/29/2019 to 05/02/2020 Revlimid 2/3 weeks  CURRENT THERAPY: surveillance  INTERVAL HISTORY:  Ms. BLESSYN SOMMERVILLE, a 82 y.o. female, seen for follow-up of multiple myeloma.  She is accompanied by her daughter today.  Denies any new onset pains.  Energy levels are 75%.  Constipation alternating with diarrhea is stable.  No recent infections noted.  REVIEW OF SYSTEMS:  Review of Systems  Respiratory:  Positive for shortness of breath.   Gastrointestinal:  Positive for constipation and diarrhea.  Musculoskeletal:  Positive for arthralgias (knees).  All other systems reviewed and are negative.   PAST MEDICAL/SURGICAL HISTORY:  Past Medical History:  Diagnosis Date   Arthritis    Cancer [facility])    Cataract    Chest pain    Associated with weakness and fatigue   Diabetes mellitus    A1c of 7.4 in 08/2010   GERD (gastroesophageal reflux disease)    Glaucoma    Hyperlipidemia    Lipid profile in 08/2010:190, 121, 48, 118; normal CBC and CMet   Hypertension    Lipid profile in 08/2010:190, 121, 48, 118; normal CBC and CMet   Hypothyroidism    Hypothyroidism    Obesity    Past Surgical History:  Procedure Laterality Date   CHOLECYSTECTOMY     COLONOSCOPY N/A 04/01/2014   RMR: Melanosis coli. colonic polyps removed as described above.    CYSTOSCOPY W/ URETERAL STENT PLACEMENT Right 09/13/2019   Procedure: CYSTOSCOPY WITH RIGHT  RETROGRADE PYELOGRAM; BLADDER BIOPSY;  Surgeon: Cleon Gustin, MD;  Location: [facility];  Service: Urology;  Laterality: Right;   LEFT HEART CATH AND CORONARY ANGIOGRAPHY N/A 10/07/2017   Procedure: LEFT HEART CATH AND CORONARY ANGIOGRAPHY;  Surgeon:  Troy Sine, MD;  Location: [facility];  Service: Cardiovascular;  Laterality: N/A;   THYROIDECTOMY, PARTIAL      SOCIAL HISTORY:  Social History   Socioeconomic History   Marital status: Widowed    Spouse name: Not on file   Number of children: 7   Years of education: Not on file   Highest education level: Not on file  Occupational History    Employer: RETIRED  Tobacco Use   Smoking status: Never    Passive exposure: Yes   Smokeless tobacco: Never  Vaping Use   Vaping Use: Never used  Substance and Sexual Activity   Alcohol use: Never   Drug use: Never   Sexual activity: Not Currently  Other Topics Concern   Not on file  Social History Narrative   Daughter, Corky Sing, lives with and assists patient.    Social Determinants of Health   Financial Resource Strain: Low Risk  (11/05/2020)   Overall Financial Resource Strain (CARDIA)    Difficulty of Paying Living Expenses: Not hard at all  Food Insecurity: No Food Insecurity (11/05/2020)   Hunger Vital Sign    Worried About Running Out of Food in the Last Year: Never true    Ran Out of Food in the Last Year: Never true  Transportation Needs: No Transportation Needs (11/05/2020)   PRAPARE - Hydrologist (Medical): No    Lack of Transportation (Non-Medical):  No  Physical Activity: Sufficiently Active (11/05/2020)   Exercise Vital Sign    Days of Exercise per Week: 5 days    Minutes of Exercise per Session: 30 min  Stress: No Stress Concern Present (11/05/2020)   [place]    Feeling of Stress : Not at all  Social Connections: Moderately Integrated (11/05/2020)   Social Connection and Isolation Panel [NHANES]    Frequency of Communication with Friends and Family: More than three times a week    Frequency of Social Gatherings with Friends and Family: More than three times a week    Attends Religious Services: 1 to 4 times per  year    Active Member of [employer] or Organizations: Yes    Attends Archivist Meetings: 1 to 4 times per year    Marital Status: Widowed  Intimate Partner Violence: Not At Risk (11/05/2020)   Humiliation, Afraid, Rape, and Kick questionnaire    Fear of Current or Ex-Partner: No    Emotionally Abused: No    Physically Abused: No    Sexually Abused: No    FAMILY HISTORY:  Family History  Problem Relation Age of Onset   Anemia Father    Arthritis Mother    Stroke Brother    Cancer Sister        unknown kind   Diabetes Sister    Dementia Sister    Cancer Niece        Breast   Cancer Brother    Healthy Son    Healthy Son    Healthy Son    Healthy Daughter    Healthy Daughter    Healthy Daughter    Sarcoidosis Daughter     CURRENT MEDICATIONS:  Current Outpatient Medications  Medication Sig Dispense Refill   albuterol (VENTOLIN HFA) 108 (90 Base) MCG/ACT inhaler Inhale 2 puffs into the lungs every 6 (six) hours as needed for wheezing or shortness of breath. 8 g 0   ASPIRIN 81 PO Take 1 tablet by mouth daily.     blood glucose meter kit and supplies Dispense based on patient and insurance preference. Use up to four times daily as directed. (FOR ICD-10 E10.9, E11.9). 1 each 0   Blood Glucose Monitoring Suppl (BLOOD GLUCOSE SYSTEM PAK) KIT Please dispense based on patient and insurance preference. Use as directed to monitor FSBS 2x daily. Dx: E11.9. 1 each 1   Calcium 500 MG tablet Take 600 mg by mouth 2 (two) times daily.     dexamethasone (DECADRON) 4 MG tablet Take 5 tablets (20 mg total) by mouth once a week. 20 tablet 5   fluticasone (FLONASE) 50 MCG/ACT nasal spray Place 2 sprays into both nostrils daily. 16 g 6   gabapentin (NEURONTIN) 300 MG capsule TAKE ONE CAPSULE ('300MG'$  TOTAL) BY MOUTH TWO TIMES DAILY 90 capsule 1   glipiZIDE (GLUCOTROL) 5 MG tablet TAKE ONE TABLET ('5MG'$  TOTAL) BY MOUTH TWO TIMES DAILY BEFORE A MEAL 90 tablet 1   Glucose Blood (BLOOD GLUCOSE TEST  STRIPS) STRP Please dispense based on patient and insurance preference. Use as directed to monitor FSBS 2x daily. Dx: E11.9. 100 each 11   guaiFENesin-dextromethorphan (ROBITUSSIN DM) 100-10 MG/5ML syrup Take 5 mLs by mouth every 4 (four) hours as needed for cough. 118 mL 0   HM LORATADINE 10 MG tablet TAKE ONE TABLET ('10MG'$  TOTAL) BY MOUTH DAILY (Patient taking differently: Take 10 mg by mouth daily as needed for allergies.) 30  tablet 0   HYDROcodone-acetaminophen (NORCO/VICODIN) 5-325 MG tablet TAKE ONE (1) TABLET BY MOUTH EVERY 12 HOURS AS NEEDED FOR MODERATE PAIN 60 tablet 0   Lancets MISC Please dispense based on patient and insurance preference. Use as directed to monitor FSBS 2x daily. Dx: E11.9. 100 each 11   lenalidomide (REVLIMID) 10 MG capsule Take 1 capsule (10 mg total) by mouth daily. 14 days on, 7 days off 14 capsule 0   losartan (COZAAR) 25 MG tablet Take 1 tablet (25 mg total) by mouth daily. 90 tablet 1   magic mouthwash w/lidocaine SOLN Take 5 mLs by mouth 4 (four) times daily. 450 mL 0   magnesium oxide (MAG-OX) 400 (240 Mg) MG tablet Take 1 tablet (400 mg total) by mouth 3 (three) times daily. 90 tablet 6   meclizine (ANTIVERT) 25 MG tablet Take 25 mg by mouth every 6 (six) hours as needed for dizziness.     metFORMIN (GLUCOPHAGE) 1000 MG tablet TAKE ONE TABLET BY MOUTH TWICE A DAY 60 tablet 3   metoprolol tartrate (LOPRESSOR) 25 MG tablet TAKE ONE-HALF TABLET (12.'5MG'$  TOTAL) BY MOUTH TWO TIMES DAILY 90 tablet 3   omeprazole (PRILOSEC) 40 MG capsule TAKE ONE CAPSULE BY MOUTH DAILY 90 capsule 3   polyethylene glycol (MIRALAX / GLYCOLAX) 17 g packet Take 17 g by mouth daily. 14 each 0   potassium chloride (KLOR-CON M) 10 MEQ tablet Take 2 tablets (20 mEq total) by mouth daily. 60 tablet 2   potassium chloride (KLOR-CON) 10 MEQ tablet Take 20 mEq by mouth daily.     simvastatin (ZOCOR) 40 MG tablet TAKE ONE TABLET ('40MG'$  TOTAL) BY MOUTH BEDTIME 90 tablet 3   SYNTHROID 137 MCG tablet  TAKE ONE TABLET (137MCG TOTAL) BY MOUTH DAILY BEFORE BREAKFAST 90 tablet 1   traMADol (ULTRAM) 50 MG tablet TAKE ONE TABLET ('50MG'$  TOTAL) BY MOUTH EVERY SIX HOURS AS NEEDED 120 tablet 0   zinc oxide (MEIJER ZINC OXIDE) 20 % ointment Apply 1 application topically as needed for irritation. 56.7 g 0   No current facility-administered medications for this visit.    ALLERGIES:  No Known Allergies  PHYSICAL EXAM:  Performance status (ECOG): 3 - Symptomatic, >50% confined to bed  Vitals:   03/01/22 1133  BP: 95/83  Pulse: (!) 57  Resp: 18  Temp: 99.1 F (37.3 C)  SpO2: 99%    Wt Readings from Last 3 Encounters:  03/01/22 166 lb (75.3 kg)  11/03/21 155 lb (70.3 kg)  09/29/21 175 lb (79.4 kg)   Physical Exam Vitals reviewed.  Constitutional:      Appearance: Normal appearance.     Comments: In wheelchair  Cardiovascular:     Rate and Rhythm: Normal rate and regular rhythm.     Pulses: Normal pulses.     Heart sounds: Normal heart sounds.  Pulmonary:     Effort: Pulmonary effort is normal.     Breath sounds: Normal breath sounds.  Neurological:     General: No focal deficit present.     Mental Status: She is alert and oriented to person, place, and time.  Psychiatric:        Mood and Affect: Mood normal.        Behavior: Behavior normal.     LABORATORY DATA:  I have reviewed the labs as listed.     Latest Ref Rng & Units 02/22/2022    9:45 AM 12/22/2021   11:34 AM 10/28/2021   10:57 AM  CBC  WBC 4.0 - 10.5 K/uL 4.9  5.8  5.5   Hemoglobin 12.0 - 15.0 g/dL 11.2  12.1  11.9   Hematocrit 36.0 - 46.0 % 35.7  37.7  37.0   Platelets 150 - 400 K/uL 162  181  218       Latest Ref Rng & Units 02/22/2022    9:45 AM 01/25/2022   10:38 AM 12/22/2021   11:34 AM  CMP  Glucose 70 - 99 mg/dL 136  160  133   BUN 8 - 23 mg/dL '15  20  19   '$ Creatinine 0.44 - 1.00 mg/dL 1.02  1.14  1.05   Sodium 135 - 145 mmol/L 140  140  139   Potassium 3.5 - 5.1 mmol/L 3.9  3.8  3.8   Chloride  98 - 111 mmol/L 106  109  107   CO2 22 - 32 mmol/L '23  24  24   '$ Calcium 8.9 - 10.3 mg/dL 8.3  8.2  8.4   Total Protein 6.5 - 8.1 g/dL 7.0  7.3  7.5   Total Bilirubin 0.3 - 1.2 mg/dL 0.6  0.6  0.9   Alkaline Phos 38 - 126 U/L 48  80  48   AST 15 - 41 U/L '16  18  20   '$ ALT 0 - 44 U/L 11  49  20       Component Value Date/Time   RBC 3.59 (L) 02/22/2022 0945   MCV 99.4 02/22/2022 0945   MCH 31.2 02/22/2022 0945   MCHC 31.4 02/22/2022 0945   RDW 15.2 02/22/2022 0945   LYMPHSABS 2.3 02/22/2022 0945   MONOABS 0.4 02/22/2022 0945   EOSABS 0.2 02/22/2022 0945   BASOSABS 0.0 02/22/2022 0945    DIAGNOSTIC IMAGING:  I have independently reviewed the scans and discussed with the patient. No results found.   ASSESSMENT:  1.  Normocytic anemia: -Patient seen at the request of Dr. Buelah Manis for further work-up and management of normocytic anemia. -Recent CBC on 08/21/2019 shows hemoglobin 6.9 with MCV of 88.3.  White count and platelets are normal.  No history of CKD.  Denies any bleeding per rectum or melena. Last colonoscopy on 04/01/2014 shows diffusely pigmented rectal mucosa consistent with melanosis [name]  Diffusely pigmented colonic mucosa, 2 diminutive polyps in the mid ascending segment, otherwise the remainder of the colon mucosa was normal.  Biopsy consistent with tubular adenoma. -CT renal study on 05/11/2019 shows normal-sized spleen with normal liver.  No other abnormal adenopathy. -Denies any prior history of blood transfusion.  She is currently taking iron tablet twice daily.   2.  IgG lambda plasma cell myeloma: -Skeletal survey on 09/12/2019 shows diffuse faint lucencies in the skull, bilateral femurs. -Bone marrow biopsy on 09/27/2019 shows 70% plasma cells in hypercellular marrow.  Chromosome analysis was 48, XX. FISH panel was normal.  However quality of specimen is compromised. -LDH normal, beta-2 microglobulin 2.9.  M spike was 2.1 g.  Kappa light chains  19.7, lambda light chains 14.3 with ratio of 1.38. -24-hour urine shows nonnephrotic range proteinuria.  Urine immunofixation was positive. - 9 cycles of RVD from 10/29/2019 through 05/02/2020. - Because of her age and comorbidities, she was not considered a candidate for bone marrow transplant. - Maintenance Revlimid was held due to severe weakness. - Revlimid 10 mg 2 weeks on/1 week off restarted back on 06/03/2021 due to worsening M spike.   PLAN:  1.  IgG lambda plasma cell myeloma: -  She is tolerating Revlimid reasonably well. - Reviewed myeloma panel from 02/22/2022.  M spike increased to 0.6 g from 0.5 g.  Lambda light chains are stable at 27.5, increased last 2 times.  Ratio is 1.86. - Because of slight worsening of myeloma labs, I have recommended adding dexamethasone 20 mg weekly to go to the maintenance Revlimid 10 mg 2 weeks on/1 week off. - RTC 4 weeks for follow-up.  Will check HbA1c at next visit.  If the sugars are running high, we will have to increase the dose of glipizide.     2.  Hypomagnesemia: - Continue magnesium 3 times daily.  Magnesium is 1.7.   3.  Bilateral knee pains: - Continue tramadol 3 to 4 tablets daily as needed.   4.  Bone protection: - Continue denosumab monthly.  Calcium is 8.3.  5.  Hypokalemia: - Continue potassium supplements.  Potassium is 3.9.  Orders placed this encounter:  No orders of the defined types were placed in this encounter.     Derek Jack, MD [place] [phone]

## 2022-03-02 ENCOUNTER — Other Ambulatory Visit: Payer: Self-pay

## 2022-03-02 MED ORDER — LENALIDOMIDE 10 MG PO CAPS: 10.0000 mg | ORAL_CAPSULE | Freq: Every day | ORAL | 0 refills | Status: DC

## 2022-03-02 NOTE — Telephone Encounter (Signed)
Chart reviewed. Revlimid refilled per last office note with Dr. Katragadda.  

## 2022-03-08 ENCOUNTER — Ambulatory Visit: Payer: Medicare Other | Admitting: Internal Medicine

## 2022-03-08 ENCOUNTER — Encounter: Payer: Self-pay | Admitting: Internal Medicine

## 2022-03-23 ENCOUNTER — Other Ambulatory Visit: Payer: Self-pay

## 2022-03-23 MED ORDER — LENALIDOMIDE 10 MG PO CAPS: 10.0000 mg | ORAL_CAPSULE | Freq: Every day | ORAL | 0 refills | Status: DC

## 2022-03-23 NOTE — Telephone Encounter (Signed)
Chart reviewed. Revlimid refilled per last office note with Dr. Katragadda.  

## 2022-03-26 ENCOUNTER — Other Ambulatory Visit: Payer: Self-pay

## 2022-03-26 DIAGNOSIS — E1140 Type 2 diabetes mellitus with diabetic neuropathy, unspecified: Secondary | ICD-10-CM

## 2022-03-26 DIAGNOSIS — C9000 Multiple myeloma not having achieved remission: Secondary | ICD-10-CM

## 2022-03-29 ENCOUNTER — Inpatient Hospital Stay: Payer: Medicare Other | Attending: Hematology | Admitting: Hematology

## 2022-03-29 ENCOUNTER — Encounter: Payer: Self-pay | Admitting: Internal Medicine

## 2022-03-29 ENCOUNTER — Inpatient Hospital Stay: Payer: Medicare Other

## 2022-03-29 ENCOUNTER — Other Ambulatory Visit: Payer: Self-pay | Admitting: *Deleted

## 2022-03-29 ENCOUNTER — Ambulatory Visit (INDEPENDENT_AMBULATORY_CARE_PROVIDER_SITE_OTHER): Payer: Medicare Other | Admitting: Internal Medicine

## 2022-03-29 VITALS — BP 138/77 | HR 89 | Ht 62.0 in

## 2022-03-29 DIAGNOSIS — E1140 Type 2 diabetes mellitus with diabetic neuropathy, unspecified: Secondary | ICD-10-CM

## 2022-03-29 DIAGNOSIS — Z8639 Personal history of other endocrine, nutritional and metabolic disease: Secondary | ICD-10-CM | POA: Insufficient documentation

## 2022-03-29 DIAGNOSIS — E039 Hypothyroidism, unspecified: Secondary | ICD-10-CM | POA: Diagnosis not present

## 2022-03-29 DIAGNOSIS — C9000 Multiple myeloma not having achieved remission: Secondary | ICD-10-CM

## 2022-03-29 DIAGNOSIS — Z7952 Long term (current) use of systemic steroids: Secondary | ICD-10-CM | POA: Diagnosis not present

## 2022-03-29 DIAGNOSIS — I779 Disorder of arteries and arterioles, unspecified: Secondary | ICD-10-CM | POA: Diagnosis not present

## 2022-03-29 DIAGNOSIS — N1832 Chronic kidney disease, stage 3b: Secondary | ICD-10-CM | POA: Diagnosis not present

## 2022-03-29 DIAGNOSIS — I10 Essential (primary) hypertension: Secondary | ICD-10-CM | POA: Diagnosis not present

## 2022-03-29 DIAGNOSIS — R531 Weakness: Secondary | ICD-10-CM | POA: Diagnosis not present

## 2022-03-29 LAB — CBC WITH DIFFERENTIAL/PLATELET
Abs Immature Granulocytes: 0.3 10*3/uL — ABNORMAL HIGH (ref 0.00–0.07)
Basophils Absolute: 0.1 10*3/uL (ref 0.0–0.1)
Basophils Relative: 1 %
Eosinophils Absolute: 0.3 10*3/uL (ref 0.0–0.5)
Eosinophils Relative: 4 %
HCT: 37.1 % (ref 36.0–46.0)
Hemoglobin: 11 g/dL — ABNORMAL LOW (ref 12.0–15.0)
Immature Granulocytes: 4 %
Lymphocytes Relative: 28 %
Lymphs Abs: 2.3 10*3/uL (ref 0.7–4.0)
MCH: 30.8 pg (ref 26.0–34.0)
MCHC: 29.6 g/dL — ABNORMAL LOW (ref 30.0–36.0)
MCV: 103.9 fL — ABNORMAL HIGH (ref 80.0–100.0)
Monocytes Absolute: 0.5 10*3/uL (ref 0.1–1.0)
Monocytes Relative: 6 %
Neutro Abs: 4.8 10*3/uL (ref 1.7–7.7)
Neutrophils Relative %: 57 %
Platelets: 199 10*3/uL (ref 150–400)
RBC: 3.57 MIL/uL — ABNORMAL LOW (ref 3.87–5.11)
RDW: 16 % — ABNORMAL HIGH (ref 11.5–15.5)
WBC: 8.1 10*3/uL (ref 4.0–10.5)
nRBC: 0.2 % (ref 0.0–0.2)

## 2022-03-29 LAB — COMPREHENSIVE METABOLIC PANEL
ALT: 101 U/L — ABNORMAL HIGH (ref 0–44)
AST: 26 U/L (ref 15–41)
Albumin: 3.3 g/dL — ABNORMAL LOW (ref 3.5–5.0)
Alkaline Phosphatase: 84 U/L (ref 38–126)
Anion gap: 9 (ref 5–15)
BUN: 21 mg/dL (ref 8–23)
CO2: 23 mmol/L (ref 22–32)
Calcium: 8 mg/dL — ABNORMAL LOW (ref 8.9–10.3)
Chloride: 106 mmol/L (ref 98–111)
Creatinine, Ser: 1.14 mg/dL — ABNORMAL HIGH (ref 0.44–1.00)
GFR, Estimated: 49 mL/min — ABNORMAL LOW (ref >60)
Glucose, Bld: 148 mg/dL — ABNORMAL HIGH (ref 70–99)
Potassium: 3.4 mmol/L — ABNORMAL LOW (ref 3.5–5.1)
Sodium: 138 mmol/L (ref 135–145)
Total Bilirubin: 1.1 mg/dL (ref 0.3–1.2)
Total Protein: 6.8 g/dL (ref 6.5–8.1)

## 2022-03-29 LAB — HEMOGLOBIN A1C
Hgb A1c MFr Bld: 6 % — ABNORMAL HIGH (ref 4.8–5.6)
Mean Plasma Glucose: 125.5 mg/dL

## 2022-03-29 LAB — POCT GLYCOSYLATED HEMOGLOBIN (HGB A1C): HbA1c, POC (controlled diabetic range): 6.4 % (ref 0.0–7.0)

## 2022-03-29 MED ORDER — DENOSUMAB 120 MG/1.7ML ~~LOC~~ SOLN
120.0000 mg | Freq: Once | SUBCUTANEOUS | Status: AC
  Administered 2022-03-29: 120 mg via SUBCUTANEOUS
  Filled 2022-03-29: qty 1.7

## 2022-03-29 MED ORDER — METFORMIN HCL 1000 MG PO TABS: 1000.0000 mg | ORAL_TABLET | Freq: Two times a day (BID) | ORAL | 3 refills | Status: DC

## 2022-03-29 MED ORDER — LEVOTHYROXINE SODIUM 137 MCG PO TABS: 137.0000 ug | ORAL_TABLET | Freq: Every day | ORAL | 1 refills | Status: DC

## 2022-03-29 MED ORDER — LOSARTAN POTASSIUM 25 MG PO TABS: 25.0000 mg | ORAL_TABLET | Freq: Every day | ORAL | 1 refills | Status: DC

## 2022-03-29 NOTE — Patient Instructions (Signed)
[place]  Discharge Instructions: Thank you for choosing [facility] to provide your oncology and hematology care.  If you have a lab appointment with the [facility], please come in thru the Main Entrance and check in at the main information desk.  Wear comfortable clothing and clothing appropriate for easy access to any Portacath or PICC line.   We strive to give you quality time with your provider. You may need to reschedule your appointment if you arrive late (15 or more minutes).  Arriving late affects you and other patients whose appointments are after yours.  Also, if you miss three or more appointments without notifying the office, you may be dismissed from the clinic at the provider's discretion.      For prescription refill requests, have your pharmacy contact our office and allow 72 hours for refills to be completed.    Today you received the following Xgeva, return as scheduled.   To help prevent nausea and vomiting after your treatment, we encourage you to take your nausea medication as directed.  BELOW ARE SYMPTOMS THAT SHOULD BE REPORTED IMMEDIATELY: *FEVER GREATER THAN 100.4 F (38 C) OR HIGHER *CHILLS OR SWEATING *NAUSEA AND VOMITING THAT IS NOT CONTROLLED WITH YOUR NAUSEA MEDICATION *UNUSUAL SHORTNESS OF BREATH *UNUSUAL BRUISING OR BLEEDING *URINARY PROBLEMS (pain or burning when urinating, or frequent urination) *BOWEL PROBLEMS (unusual diarrhea, constipation, pain near the anus) TENDERNESS IN MOUTH AND THROAT WITH OR WITHOUT PRESENCE OF ULCERS (sore throat, sores in mouth, or a toothache) UNUSUAL RASH, SWELLING OR PAIN  UNUSUAL VAGINAL DISCHARGE OR ITCHING   Items with * indicate a potential emergency and should be followed up as soon as possible or go to the Emergency Department if any problems should occur.  Please show the CHEMOTHERAPY ALERT CARD or IMMUNOTHERAPY ALERT CARD at check-in to the Emergency Department and triage  nurse.  Should you have questions after your visit or need to cancel or reschedule your appointment, please contact [facility] [phone]  and follow the prompts.  Office hours are 8:00 a.m. to 4:30 p.m. Monday - Friday. Please note that voicemails left after 4:00 p.m. may not be returned until the following business day.  We are closed weekends and major holidays. You have access to a nurse at all times for urgent questions. Please call the main number to the clinic [phone] and follow the prompts.  For any non-urgent questions, you may also contact your provider using MyChart. We now offer e-Visits for anyone 51 and older to request care online for non-urgent symptoms. For details visit [email or web address].   Also download the MyChart app! Go to the app store, search "MyChart", open the app, select [facility], and log in with your MyChart username and password.

## 2022-03-29 NOTE — Assessment & Plan Note (Addendum)
Lab Results  Component Value Date   TSH 3.414 09/26/2020   On Synthroid 137 mcg QD Check TSH and free T4

## 2022-03-29 NOTE — Progress Notes (Signed)
Established Patient Office Visit  Subjective:  Patient ID: Kim Mills, female    DOB: 07/22/1940  Age: 82 y.o. MRN: [id number]  CC:  Chief Complaint  Patient presents with   Hypertension    Follow up for hypertension and chronic pain     HPI Kim Mills is a 82 y.o. female with past medical history of multiple myeloma, HTN, CAD, DM with neuropathy, CKD stage 3, hypothyroidism, overactive bladder and chronic pain who presents for f/u of her chronic medical conditions.  HTN: Her BP remains stable considering her age. Takes medications regularly. Patient denies headache, dizziness, chest pain, dyspnea or palpitations.  DM: She takes Metformin and Glipizide. Denies any polyuria or polyphagia. Her HbA1C was 6.4 today.  She has chronic feet pain, for which she takes gabapentin.  She complains of chronic, diffuse pain of joints.  She has been getting tramadol for cancer related pain, but has to take it QID for pain control.  She takes Norco as needed for severe pain.  She follows up with Oncology for h/o MM.   Past Medical History:  Diagnosis Date   Arthritis    Cancer [facility])    Cataract    Chest pain    Associated with weakness and fatigue   Diabetes mellitus    A1c of 7.4 in 08/2010   GERD (gastroesophageal reflux disease)    Glaucoma    Hyperlipidemia    Lipid profile in 08/2010:190, 121, 48, 118; normal CBC and CMet   Hypertension    Lipid profile in 08/2010:190, 121, 48, 118; normal CBC and CMet   Hypothyroidism    Hypothyroidism    Obesity     Past Surgical History:  Procedure Laterality Date   CHOLECYSTECTOMY     COLONOSCOPY N/A 04/01/2014   RMR: Melanosis coli. colonic polyps removed as described above.    CYSTOSCOPY W/ URETERAL STENT PLACEMENT Right 09/13/2019   Procedure: CYSTOSCOPY WITH RIGHT  RETROGRADE PYELOGRAM; BLADDER BIOPSY;  Surgeon: Cleon Gustin, MD;  Location: [facility];  Service: Urology;  Laterality: Right;   LEFT HEART CATH AND CORONARY  ANGIOGRAPHY N/A 10/07/2017   Procedure: LEFT HEART CATH AND CORONARY ANGIOGRAPHY;  Surgeon: Troy Sine, MD;  Location: [facility];  Service: Cardiovascular;  Laterality: N/A;   THYROIDECTOMY, PARTIAL      Family History  Problem Relation Age of Onset   Anemia Father    Arthritis Mother    Stroke Brother    Cancer Sister        unknown kind   Diabetes Sister    Dementia Sister    Cancer Niece        Breast   Cancer Brother    Healthy Son    Healthy Son    Healthy Son    Healthy Daughter    Healthy Daughter    Healthy Daughter    Sarcoidosis Daughter     Social History   Socioeconomic History   Marital status: Widowed    Spouse name: Not on file   Number of children: 7   Years of education: Not on file   Highest education level: Not on file  Occupational History    Employer: RETIRED  Tobacco Use   Smoking status: Never    Passive exposure: Yes   Smokeless tobacco: Never  Vaping Use   Vaping Use: Never used  Substance and Sexual Activity   Alcohol use: Never   Drug use: Never   Sexual activity: Not  Currently  Other Topics Concern   Not on file  Social History Narrative   Daughter, Corky Sing, lives with and assists patient.    Social Determinants of Health   Financial Resource Strain: Low Risk  (11/05/2020)   Overall Financial Resource Strain (CARDIA)    Difficulty of Paying Living Expenses: Not hard at all  Food Insecurity: No Food Insecurity (11/05/2020)   Hunger Vital Sign    Worried About Running Out of Food in the Last Year: Never true    Ran Out of Food in the Last Year: Never true  Transportation Needs: No Transportation Needs (11/05/2020)   PRAPARE - Hydrologist (Medical): No    Lack of Transportation (Non-Medical): No  Physical Activity: Sufficiently Active (11/05/2020)   Exercise Vital Sign    Days of Exercise per Week: 5 days    Minutes of Exercise per Session: 30 min  Stress: No Stress Concern Present  (11/05/2020)   Kim Mills    Feeling of Stress : Not at all  Social Connections: Moderately Integrated (11/05/2020)   Social Connection and Isolation Panel [NHANES]    Frequency of Communication with Friends and Family: More than three times a week    Frequency of Social Gatherings with Friends and Family: More than three times a week    Attends Religious Services: 1 to 4 times per year    Active Member of [employer] or Organizations: Yes    Attends Archivist Meetings: 1 to 4 times per year    Marital Status: Widowed  Intimate Partner Violence: Not At Risk (11/05/2020)   Humiliation, Afraid, Rape, and Kick questionnaire    Fear of Current or Ex-Partner: No    Emotionally Abused: No    Physically Abused: No    Sexually Abused: No    Outpatient Medications Prior to Visit  Medication Sig Dispense Refill   albuterol (VENTOLIN HFA) 108 (90 Base) MCG/ACT inhaler Inhale 2 puffs into the lungs every 6 (six) hours as needed for wheezing or shortness of breath. 8 g 0   ASPIRIN 81 PO Take 1 tablet by mouth daily.     blood glucose meter kit and supplies Dispense based on patient and insurance preference. Use up to four times daily as directed. (FOR ICD-10 E10.9, E11.9). 1 each 0   Blood Glucose Monitoring Suppl (BLOOD GLUCOSE SYSTEM PAK) KIT Please dispense based on patient and insurance preference. Use as directed to monitor FSBS 2x daily. Dx: E11.9. 1 each 1   Calcium 500 MG tablet Take 600 mg by mouth 2 (two) times daily.     dexamethasone (DECADRON) 4 MG tablet Take 5 tablets (20 mg total) by mouth once a week. 20 tablet 5   fluticasone (FLONASE) 50 MCG/ACT nasal spray Place 2 sprays into both nostrils daily. 16 g 6   gabapentin (NEURONTIN) 300 MG capsule TAKE ONE CAPSULE (300MG TOTAL) BY MOUTH TWO TIMES DAILY 90 capsule 1   glipiZIDE (GLUCOTROL) 5 MG tablet TAKE ONE TABLET (5MG TOTAL) BY MOUTH TWO TIMES DAILY BEFORE A MEAL 90  tablet 1   Glucose Blood (BLOOD GLUCOSE TEST STRIPS) STRP Please dispense based on patient and insurance preference. Use as directed to monitor FSBS 2x daily. Dx: E11.9. 100 each 11   HM LORATADINE 10 MG tablet TAKE ONE TABLET (10MG TOTAL) BY MOUTH DAILY (Patient taking differently: Take 10 mg by mouth daily as needed for allergies.) 30 tablet 0  HYDROcodone-acetaminophen (NORCO/VICODIN) 5-325 MG tablet TAKE ONE (1) TABLET BY MOUTH EVERY 12 HOURS AS NEEDED FOR MODERATE PAIN 60 tablet 0   Lancets MISC Please dispense based on patient and insurance preference. Use as directed to monitor FSBS 2x daily. Dx: E11.9. 100 each 11   lenalidomide (REVLIMID) 10 MG capsule Take 1 capsule (10 mg total) by mouth daily. 14 days on, 7 days off 14 capsule 0   magic mouthwash w/lidocaine SOLN Take 5 mLs by mouth 4 (four) times daily. 450 mL 0   magnesium oxide (MAG-OX) 400 (240 Mg) MG tablet Take 1 tablet (400 mg total) by mouth 3 (three) times daily. 90 tablet 6   meclizine (ANTIVERT) 25 MG tablet Take 25 mg by mouth every 6 (six) hours as needed for dizziness.     metoprolol tartrate (LOPRESSOR) 25 MG tablet TAKE ONE-HALF TABLET (12.5MG TOTAL) BY MOUTH TWO TIMES DAILY 90 tablet 3   omeprazole (PRILOSEC) 40 MG capsule TAKE ONE CAPSULE BY MOUTH DAILY 90 capsule 3   polyethylene glycol (MIRALAX / GLYCOLAX) 17 g packet Take 17 g by mouth daily. 14 each 0   potassium chloride (KLOR-CON M) 10 MEQ tablet Take 2 tablets (20 mEq total) by mouth daily. 60 tablet 2   potassium chloride (KLOR-CON) 10 MEQ tablet Take 20 mEq by mouth daily.     simvastatin (ZOCOR) 40 MG tablet TAKE ONE TABLET (40MG TOTAL) BY MOUTH BEDTIME 90 tablet 3   traMADol (ULTRAM) 50 MG tablet TAKE ONE TABLET (50MG TOTAL) BY MOUTH EVERY SIX HOURS AS NEEDED 120 tablet 0   zinc oxide (MEIJER ZINC OXIDE) 20 % ointment Apply 1 application topically as needed for irritation. 56.7 g 0   guaiFENesin-dextromethorphan (ROBITUSSIN DM) 100-10 MG/5ML syrup Take 5 mLs  by mouth every 4 (four) hours as needed for cough. 118 mL 0   losartan (COZAAR) 25 MG tablet Take 1 tablet (25 mg total) by mouth daily. 90 tablet 1   metFORMIN (GLUCOPHAGE) 1000 MG tablet TAKE ONE TABLET BY MOUTH TWICE A DAY 60 tablet 3   SYNTHROID 137 MCG tablet TAKE ONE TABLET (137MCG TOTAL) BY MOUTH DAILY BEFORE BREAKFAST 90 tablet 1   No facility-administered medications prior to visit.    No Known Allergies  ROS Review of Systems  Constitutional:  Positive for fatigue. Negative for chills and fever.  HENT:  Negative for congestion, postnasal drip and sore throat.   Eyes:  Negative for pain and discharge.  Respiratory:  Negative for cough and shortness of breath.   Cardiovascular:  Negative for chest pain and palpitations.  Gastrointestinal:  Negative for abdominal pain, diarrhea, nausea and vomiting.  Endocrine: Negative for polydipsia and polyuria.  Genitourinary:  Negative for dysuria and hematuria.  Musculoskeletal:  Positive for arthralgias, back pain and neck pain.  Skin:  Negative for rash.  Neurological:  Positive for weakness (Generalized). Negative for dizziness.  Psychiatric/Behavioral:  Negative for agitation and behavioral problems.       Objective:    Physical Exam Vitals reviewed.  Constitutional:      General: She is not in acute distress.    Appearance: She is not diaphoretic.     Comments: In wheelchair  HENT:     Head: Normocephalic and atraumatic.     Nose: No congestion.     Mouth/Throat:     Mouth: Mucous membranes are moist.     Pharynx: No posterior oropharyngeal erythema.  Eyes:     General: No scleral icterus.  Extraocular Movements: Extraocular movements intact.  Cardiovascular:     Rate and Rhythm: Normal rate and regular rhythm.     Pulses: Normal pulses.     Heart sounds: Normal heart sounds. No murmur heard. Pulmonary:     Breath sounds: Normal breath sounds. No wheezing or rales.  Musculoskeletal:        General: Tenderness  (Bilateral knee, with swelling) present.     Cervical back: Neck supple. No rigidity or tenderness.     Right lower leg: Edema (1+) present.     Left lower leg: Edema (1+) present.  Skin:    General: Skin is warm.  Neurological:     General: No focal deficit present.     Mental Status: She is alert and oriented to person, place, and time.     Sensory: Sensory deficit present.     Motor: Weakness (3/5 in b/l LE) present.  Psychiatric:        Mood and Affect: Mood normal.        Behavior: Behavior normal.     BP 138/77 (BP Location: Right Arm, Patient Position: Sitting, Cuff Size: Normal)   Pulse 89   Ht 5' 2"$  (1.575 m)   SpO2 92%   BMI 30.36 kg/m  Wt Readings from Last 3 Encounters:  03/01/22 166 lb (75.3 kg)  11/03/21 155 lb (70.3 kg)  09/29/21 175 lb (79.4 kg)    Lab Results  Component Value Date   TSH 3.414 09/26/2020   Lab Results  Component Value Date   WBC 8.1 03/29/2022   HGB 11.0 (L) 03/29/2022   HCT 37.1 03/29/2022   MCV 103.9 (H) 03/29/2022   PLT 199 03/29/2022   Lab Results  Component Value Date   NA 138 03/29/2022   K 3.4 (L) 03/29/2022   CO2 23 03/29/2022   GLUCOSE 148 (H) 03/29/2022   BUN 21 03/29/2022   CREATININE 1.14 (H) 03/29/2022   BILITOT 1.1 03/29/2022   ALKPHOS 84 03/29/2022   AST 26 03/29/2022   ALT 101 (H) 03/29/2022   PROT 6.8 03/29/2022   ALBUMIN 3.3 (L) 03/29/2022   CALCIUM 8.0 (L) 03/29/2022   ANIONGAP 9 03/29/2022   EGFR 40 (L) 09/24/2020   Lab Results  Component Value Date   CHOL 162 04/13/2019   Lab Results  Component Value Date   HDL 40 (L) 04/13/2019   Lab Results  Component Value Date   LDLCALC 97 04/13/2019   Lab Results  Component Value Date   TRIG 157 (H) 04/13/2019   Lab Results  Component Value Date   CHOLHDL 4.1 04/13/2019   Lab Results  Component Value Date   HGBA1C 6.4 03/29/2022      Assessment & Plan:   Problem List Items Addressed This Visit    Hypertension BP Readings from Last 1  Encounters:  03/29/22 138/77   Well-controlled for her age, on Metoprolol and Losartan 25 mg QD Counseled for compliance with the medications Advised low salt diet  Type 2 diabetes mellitus with diabetic neuropathy, unspecified (HCC) Lab Results  Component Value Date   HGBA1C 6.4 03/29/2022   Well controlled On metformin and glipizide Advised to follow diabetic diet On statin Diabetic foot exam: Today Diabetic eye exam: Advised to follow up with Ophthalmology for diabetic eye exam  On gabapentin to 300 mg twice daily for DM neuropathy  Hypothyroidism Lab Results  Component Value Date   TSH 3.414 09/26/2020   On Synthroid 137 mcg QD Check TSH and  free T4  CKD (chronic kidney disease) stage 3, GFR 30-59 ml/min (HCC) Has underlying MM Gets routine BMP check during Oncology visits Avoid nephrotoxic agents Added losartan for HTN  Multiple myeloma not having achieved remission ([facility]) Not in remission On Revlimid Follows up with Oncology Has chronic pain, on Tramadol Has Norco PRN as well for severe pain  Carotid artery disease without cerebral infarction [facility]) asymptomatic currently On statin  Generalized weakness From MM and OA Has diabetic neuropathy as well Has a manual wheelchair, needs to walk with walker at home to avoid worsening of muscle weakness    Meds ordered this encounter  Medications   losartan (COZAAR) 25 MG tablet    Sig: Take 1 tablet (25 mg total) by mouth daily.    Dispense:  90 tablet    Refill:  1   metFORMIN (GLUCOPHAGE) 1000 MG tablet    Sig: Take 1 tablet (1,000 mg total) by mouth 2 (two) times daily.    Dispense:  60 tablet    Refill:  3   levothyroxine (SYNTHROID) 137 MCG tablet    Sig: Take 1 tablet (137 mcg total) by mouth daily before breakfast.    Dispense:  90 tablet    Refill:  1    Follow-up: Return in about 4 months (around 07/28/2022) for HTN and DM.    Lindell Spar, MD

## 2022-03-29 NOTE — Patient Instructions (Addendum)
[place] at [facility] Discharge Instructions   You were seen and examined today by Dr. Delton Coombes.  He reviewed the results of your lab work which are normal/stable.   You may take magnesium 2 pills in the morning and one in the evening. Once of your liver numbers is slightly elevated.   We will give your Xgeva injection today.   Return as scheduled.    Thank you for choosing [place] at [facility] to provide your oncology and hematology care.  To afford each patient quality time with our provider, please arrive at least 15 minutes before your scheduled appointment time.   If you have a lab appointment with the [facility] please come in thru the Main Entrance and check in at the main information desk.  You need to re-schedule your appointment should you arrive 10 or more minutes late.  We strive to give you quality time with our providers, and arriving late affects you and other patients whose appointments are after yours.  Also, if you no show three or more times for appointments you may be dismissed from the clinic at the providers discretion.     Again, thank you for choosing [facility].  Our hope is that these requests will decrease the amount of time that you wait before being seen by our physicians.       _____________________________________________________________  Should you have questions after your visit to [facility], please contact our office at [phone] and follow the prompts.  Our office hours are 8:00 a.m. and 4:30 p.m. Monday - Friday.  Please note that voicemails left after 4:00 p.m. may not be returned until the following business day.  We are closed weekends and major holidays.  You do have access to a nurse 24-7, just call the main number to the clinic [phone] and do not press any options, hold on the line and a nurse will answer the phone.    For prescription refill requests,  have your pharmacy contact our office and allow 72 hours.    Due to Covid, you will need to wear a mask upon entering the hospital. If you do not have a mask, a mask will be given to you at the Main Entrance upon arrival. For doctor visits, patients may have 1 support person age 68 or older with them. For treatment visits, patients can not have anyone with them due to social distancing guidelines and our immunocompromised population.

## 2022-03-29 NOTE — Assessment & Plan Note (Signed)
Has underlying MM Gets routine BMP check during Oncology visits Avoid nephrotoxic agents Added losartan for HTN

## 2022-03-29 NOTE — Assessment & Plan Note (Addendum)
From MM and OA Has diabetic neuropathy as well Has a manual wheelchair, needs to walk with walker at home to avoid worsening of muscle weakness

## 2022-03-29 NOTE — Assessment & Plan Note (Signed)
BP Readings from Last 1 Encounters:  03/29/22 138/77   Well-controlled for her age, on Metoprolol and Losartan 25 mg QD Counseled for compliance with the medications Advised low salt diet

## 2022-03-29 NOTE — Progress Notes (Signed)
[place] [address], [place] [address]    Clinic Day:  03/29/2022  Referring physician: Lindell Spar, MD  Patient Care Team: Lindell Spar, MD as PCP - General (Internal Medicine) Herminio Commons, MD (Inactive) as PCP - Cardiology (Cardiology) Daneil Dolin, MD as Consulting Physician (Gastroenterology) Derek Jack, MD as Medical Oncologist ([place]) Edythe Clarity, [facility] as Pharmacist (Pharmacist)   ASSESSMENT & PLAN:   Assessment: 1.  Normocytic anemia: -Patient seen at the request of Dr. Buelah Manis for further work-up and management of normocytic anemia. -Recent CBC on 08/21/2019 shows hemoglobin 6.9 with MCV of 88.3.  White count and platelets are normal.  No history of CKD.  Denies any bleeding per rectum or melena. Last colonoscopy on 04/01/2014 shows diffusely pigmented rectal mucosa consistent with melanosis [name]  Diffusely pigmented colonic mucosa, 2 diminutive polyps in the mid ascending segment, otherwise the remainder of the colon mucosa was normal.  Biopsy consistent with tubular adenoma. -CT renal study on 05/11/2019 shows normal-sized spleen with normal liver.  No other abnormal adenopathy. -Denies any prior history of blood transfusion.  She is currently taking iron tablet twice daily.   2.  IgG lambda plasma cell myeloma: -Skeletal survey on 09/12/2019 shows diffuse faint lucencies in the skull, bilateral femurs. -Bone marrow biopsy on 09/27/2019 shows 70% plasma cells in hypercellular marrow.  Chromosome analysis was 47, XX. FISH panel was normal.  However quality of specimen is compromised. -LDH normal, beta-2 microglobulin 2.9.  M spike was 2.1 g.  Kappa light chains 19.7, lambda light chains 14.3 with ratio of 1.38. -24-hour urine shows nonnephrotic range proteinuria.  Urine immunofixation was positive. - 9 cycles of RVD from 10/29/2019 through 05/02/2020. - Because of her age and  comorbidities, she was not considered a candidate for bone marrow transplant. - Maintenance Revlimid was held due to severe weakness. - Revlimid 10 mg 2 weeks on/1 week off restarted back on 06/03/2021 due to worsening M spike.    Plan: 1.  IgG lambda plasma cell myeloma: - She is tolerating Revlimid very well. - Based on her labs from 02/22/2022 which showed M spike increased to 0.6 g, we have added weekly dexamethasone on 03/01/2022. - Labs today shows ALT elevated at 101.  Rest of LFTs are normal.  Creatinine is 1.14.  CBC is grossly normal. - Continue dexamethasone 20 mg every Tuesday.  Continue Revlimid 10 mg 2 weeks on/1 week off.  RTC 4 weeks for follow-up.  Will follow-up on myeloma panel from today.     2.  Hypomagnesemia: - Continue magnesium 3 times daily.   3.  Bilateral knee pains: - Continue tramadol 3 to 4 tablets daily as needed.   4.  Bone protection: - Calcium today is 8.0.  Continue denosumab monthly.  Continue calcium supplements at home.  5.  Hypokalemia: - Continue potassium supplements.  Potassium today is 3.4.    No orders of the defined types were placed in this encounter.     Beverly Gust Oliver,acting as a scribe for Derek Jack, MD.,have documented all relevant documentation on the behalf of Derek Jack, MD,as directed by  Derek Jack, MD while in the presence of Derek Jack, MD.   I, Derek Jack MD, have reviewed the above documentation for accuracy and completeness, and I agree with the above.   Derek Jack, MD   2/19/202412:59 PM  CHIEF COMPLAINT:   Diagnosis: multiple myeloma & normocytic anemia  Cancer Staging  No matching staging information was found for the patient.   Prior Therapy:    Velcade x 9 cycles from 10/29/2019 to 05/02/2020 Revlimid 2/3 weeks   Current Therapy: Revlimid and dexamethasone   HISTORY OF PRESENT ILLNESS:   Oncology History  Multiple myeloma not having  achieved remission ([place])  10/13/2019 Initial Diagnosis   Multiple myeloma not having achieved remission ([place])   10/29/2019 - 05/02/2020 Chemotherapy   Patient is on Treatment Plan : MYELOMA NON-TRANSPLANT CANDIDATES VRd weekly q21d        INTERVAL HISTORY:   Kim Mills is a 82 y.o. female presenting to clinic today for follow up of multiple myeloma & normocytic anemia. She was last seen by me on 03/01/2022.  Today, she states that she is doing well overall. Her appetite level is at 50%. Her energy level is at 50%.She denies any new pains. She takes Mg and calcium.  She reports taking 2 Tylenol's daily.  PAST MEDICAL HISTORY:   Past Medical History: Past Medical History:  Diagnosis Date   Arthritis    Cancer ([place])    Cataract    Chest pain    Associated with weakness and fatigue   Diabetes mellitus    A1c of 7.4 in 08/2010   GERD (gastroesophageal reflux disease)    Glaucoma    Hyperlipidemia    Lipid profile in 08/2010:190, 121, 48, 118; normal CBC and CMet   Hypertension    Lipid profile in 08/2010:190, 121, 48, 118; normal CBC and CMet   Hypothyroidism    Hypothyroidism    Obesity     Surgical History: Past Surgical History:  Procedure Laterality Date   CHOLECYSTECTOMY     COLONOSCOPY N/A 04/01/2014   RMR: Melanosis coli. colonic polyps removed as described above.    CYSTOSCOPY W/ URETERAL STENT PLACEMENT Right 09/13/2019   Procedure: CYSTOSCOPY WITH RIGHT  RETROGRADE PYELOGRAM; BLADDER BIOPSY;  Surgeon: Cleon Gustin, MD;  Location: [facility];  Service: Urology;  Laterality: Right;   LEFT HEART CATH AND CORONARY ANGIOGRAPHY N/A 10/07/2017   Procedure: LEFT HEART CATH AND CORONARY ANGIOGRAPHY;  Surgeon: Troy Sine, MD;  Location: [facility];  Service: Cardiovascular;  Laterality: N/A;   THYROIDECTOMY, PARTIAL      Social History: Social History   Socioeconomic History   Marital status: Widowed    Spouse name: Not on file   Number of children: 7   Years of  education: Not on file   Highest education level: Not on file  Occupational History    Employer: RETIRED  Tobacco Use   Smoking status: Never    Passive exposure: Yes   Smokeless tobacco: Never  Vaping Use   Vaping Use: Never used  Substance and Sexual Activity   Alcohol use: Never   Drug use: Never   Sexual activity: Not Currently  Other Topics Concern   Not on file  Social History Narrative   Daughter, Corky Sing, lives with and assists patient.    Social Determinants of Health   Financial Resource Strain: Low Risk  (11/05/2020)   Overall Financial Resource Strain (CARDIA)    Difficulty of Paying Living Expenses: Not hard at all  Food Insecurity: No Food Insecurity (11/05/2020)   Hunger Vital Sign    Worried About Running Out of Food in the Last Year: Never true    Ran Out of Food in the Last Year: Never true  Transportation Needs: No Transportation Needs (11/05/2020)   PRAPARE - Transportation  Lack of Transportation (Medical): No    Lack of Transportation (Non-Medical): No  Physical Activity: Sufficiently Active (11/05/2020)   Exercise Vital Sign    Days of Exercise per Week: 5 days    Minutes of Exercise per Session: 30 min  Stress: No Stress Concern Present (11/05/2020)   [place]    Feeling of Stress : Not at all  Social Connections: Moderately Integrated (11/05/2020)   Social Connection and Isolation Panel [NHANES]    Frequency of Communication with Friends and Family: More than three times a week    Frequency of Social Gatherings with Friends and Family: More than three times a week    Attends Religious Services: 1 to 4 times per year    Active Member of [employer] or Organizations: Yes    Attends Archivist Meetings: 1 to 4 times per year    Marital Status: Widowed  Intimate Partner Violence: Not At Risk (11/05/2020)   Humiliation, Afraid, Rape, and Kick questionnaire    Fear of Current or  Ex-Partner: No    Emotionally Abused: No    Physically Abused: No    Sexually Abused: No    Family History: Family History  Problem Relation Age of Onset   Anemia Father    Arthritis Mother    Stroke Brother    Cancer Sister        unknown kind   Diabetes Sister    Dementia Sister    Cancer Niece        Breast   Cancer Brother    Healthy Son    Healthy Son    Healthy Son    Healthy Daughter    Healthy Daughter    Healthy Daughter    Sarcoidosis Daughter     Current Medications:  Current Outpatient Medications:    albuterol (VENTOLIN HFA) 108 (90 Base) MCG/ACT inhaler, Inhale 2 puffs into the lungs every 6 (six) hours as needed for wheezing or shortness of breath., Disp: 8 g, Rfl: 0   ASPIRIN 81 PO, Take 1 tablet by mouth daily., Disp: , Rfl:    blood glucose meter kit and supplies, Dispense based on patient and insurance preference. Use up to four times daily as directed. (FOR ICD-10 E10.9, E11.9)., Disp: 1 each, Rfl: 0   Blood Glucose Monitoring Suppl (BLOOD GLUCOSE SYSTEM PAK) KIT, Please dispense based on patient and insurance preference. Use as directed to monitor FSBS 2x daily. Dx: E11.9., Disp: 1 each, Rfl: 1   Calcium 500 MG tablet, Take 600 mg by mouth 2 (two) times daily., Disp: , Rfl:    dexamethasone (DECADRON) 4 MG tablet, Take 5 tablets (20 mg total) by mouth once a week., Disp: 20 tablet, Rfl: 5   fluticasone (FLONASE) 50 MCG/ACT nasal spray, Place 2 sprays into both nostrils daily., Disp: 16 g, Rfl: 6   gabapentin (NEURONTIN) 300 MG capsule, TAKE ONE CAPSULE (300MG TOTAL) BY MOUTH TWO TIMES DAILY, Disp: 90 capsule, Rfl: 1   glipiZIDE (GLUCOTROL) 5 MG tablet, TAKE ONE TABLET (5MG TOTAL) BY MOUTH TWO TIMES DAILY BEFORE A MEAL, Disp: 90 tablet, Rfl: 1   Glucose Blood (BLOOD GLUCOSE TEST STRIPS) STRP, Please dispense based on patient and insurance preference. Use as directed to monitor FSBS 2x daily. Dx: E11.9., Disp: 100 each, Rfl: 11    guaiFENesin-dextromethorphan (ROBITUSSIN DM) 100-10 MG/5ML syrup, Take 5 mLs by mouth every 4 (four) hours as needed for cough., Disp: 118 mL, Rfl: 0  HM LORATADINE 10 MG tablet, TAKE ONE TABLET (10MG TOTAL) BY MOUTH DAILY (Patient taking differently: Take 10 mg by mouth daily as needed for allergies.), Disp: 30 tablet, Rfl: 0   HYDROcodone-acetaminophen (NORCO/VICODIN) 5-325 MG tablet, TAKE ONE (1) TABLET BY MOUTH EVERY 12 HOURS AS NEEDED FOR MODERATE PAIN, Disp: 60 tablet, Rfl: 0   Lancets MISC, Please dispense based on patient and insurance preference. Use as directed to monitor FSBS 2x daily. Dx: E11.9., Disp: 100 each, Rfl: 11   lenalidomide (REVLIMID) 10 MG capsule, Take 1 capsule (10 mg total) by mouth daily. 14 days on, 7 days off, Disp: 14 capsule, Rfl: 0   losartan (COZAAR) 25 MG tablet, Take 1 tablet (25 mg total) by mouth daily., Disp: 90 tablet, Rfl: 1   magic mouthwash w/lidocaine SOLN, Take 5 mLs by mouth 4 (four) times daily., Disp: 450 mL, Rfl: 0   magnesium oxide (MAG-OX) 400 (240 Mg) MG tablet, Take 1 tablet (400 mg total) by mouth 3 (three) times daily., Disp: 90 tablet, Rfl: 6   meclizine (ANTIVERT) 25 MG tablet, Take 25 mg by mouth every 6 (six) hours as needed for dizziness., Disp: , Rfl:    metFORMIN (GLUCOPHAGE) 1000 MG tablet, TAKE ONE TABLET BY MOUTH TWICE A DAY, Disp: 60 tablet, Rfl: 3   metoprolol tartrate (LOPRESSOR) 25 MG tablet, TAKE ONE-HALF TABLET (12.5MG TOTAL) BY MOUTH TWO TIMES DAILY, Disp: 90 tablet, Rfl: 3   omeprazole (PRILOSEC) 40 MG capsule, TAKE ONE CAPSULE BY MOUTH DAILY, Disp: 90 capsule, Rfl: 3   polyethylene glycol (MIRALAX / GLYCOLAX) 17 g packet, Take 17 g by mouth daily., Disp: 14 each, Rfl: 0   potassium chloride (KLOR-CON M) 10 MEQ tablet, Take 2 tablets (20 mEq total) by mouth daily., Disp: 60 tablet, Rfl: 2   potassium chloride (KLOR-CON) 10 MEQ tablet, Take 20 mEq by mouth daily., Disp: , Rfl:    simvastatin (ZOCOR) 40 MG tablet, TAKE ONE TABLET  (40MG TOTAL) BY MOUTH BEDTIME, Disp: 90 tablet, Rfl: 3   SYNTHROID 137 MCG tablet, TAKE ONE TABLET (137MCG TOTAL) BY MOUTH DAILY BEFORE BREAKFAST, Disp: 90 tablet, Rfl: 1   traMADol (ULTRAM) 50 MG tablet, TAKE ONE TABLET (50MG TOTAL) BY MOUTH EVERY SIX HOURS AS NEEDED, Disp: 120 tablet, Rfl: 0   zinc oxide (MEIJER ZINC OXIDE) 20 % ointment, Apply 1 application topically as needed for irritation., Disp: 56.7 g, Rfl: 0   Allergies: No Known Allergies  REVIEW OF SYSTEMS:   Review of Systems  Constitutional:  Negative for chills, fatigue and fever.  HENT:   Negative for lump/mass, mouth sores, nosebleeds, sore throat and trouble swallowing.   Eyes:  Negative for eye problems.  Respiratory:  Negative for cough and shortness of breath.   Cardiovascular:  Negative for chest pain, leg swelling and palpitations.  Gastrointestinal:  Positive for constipation, diarrhea and nausea. Negative for abdominal pain and vomiting.  Genitourinary:  Negative for bladder incontinence, difficulty urinating, dysuria, frequency, hematuria and nocturia.   Musculoskeletal:  Negative for arthralgias, back pain, flank pain, myalgias and neck pain.  Skin:  Negative for itching and rash.  Neurological:  Positive for dizziness, headaches and numbness (Feet).  Hematological:  Does not bruise/bleed easily.  Psychiatric/Behavioral:  Positive for sleep disturbance. Negative for depression and suicidal ideas. The patient is not nervous/anxious.   All other systems reviewed and are negative.    VITALS:   Blood pressure (!) 119/51, pulse 69, temperature 98.3 F (36.8 C), temperature source Tympanic,  resp. rate 17, height 5' 2"$  (1.575 m), SpO2 100 %.  Wt Readings from Last 3 Encounters:  03/01/22 166 lb (75.3 kg)  11/03/21 155 lb (70.3 kg)  09/29/21 175 lb (79.4 kg)    Body mass index is 30.36 kg/m.  Performance status (ECOG): 1 - Symptomatic but completely ambulatory  PHYSICAL EXAM:   Physical Exam Vitals and  nursing note reviewed. Exam conducted with a chaperone present.  Constitutional:      Appearance: Normal appearance.  Cardiovascular:     Rate and Rhythm: Normal rate and regular rhythm.     Pulses: Normal pulses.     Heart sounds: Normal heart sounds.  Pulmonary:     Effort: Pulmonary effort is normal.     Breath sounds: Normal breath sounds.  Abdominal:     Palpations: Abdomen is soft. There is no hepatomegaly, splenomegaly or mass.     Tenderness: There is no abdominal tenderness.  Musculoskeletal:     Right lower leg: No edema.     Left lower leg: No edema.  Lymphadenopathy:     Cervical: No cervical adenopathy.     Right cervical: No superficial, deep or posterior cervical adenopathy.    Left cervical: No superficial, deep or posterior cervical adenopathy.     Upper Body:     Right upper body: No supraclavicular or axillary adenopathy.     Left upper body: No supraclavicular or axillary adenopathy.  Neurological:     General: No focal deficit present.     Mental Status: She is alert and oriented to person, place, and time.  Psychiatric:        Mood and Affect: Mood normal.        Behavior: Behavior normal.     LABS:      Latest Ref Rng & Units 03/29/2022   10:10 AM 02/22/2022    9:45 AM 12/22/2021   11:34 AM  CBC  WBC 4.0 - 10.5 K/uL 8.1  4.9  5.8   Hemoglobin 12.0 - 15.0 g/dL 11.0  11.2  12.1   Hematocrit 36.0 - 46.0 % 37.1  35.7  37.7   Platelets 150 - 400 K/uL 199  162  181       Latest Ref Rng & Units 03/29/2022   10:10 AM 02/22/2022    9:45 AM 01/25/2022   10:38 AM  CMP  Glucose 70 - 99 mg/dL 148  136  160   BUN 8 - 23 mg/dL 21  15  20   $ Creatinine 0.44 - 1.00 mg/dL 1.14  1.02  1.14   Sodium 135 - 145 mmol/L 138  140  140   Potassium 3.5 - 5.1 mmol/L 3.4  3.9  3.8   Chloride 98 - 111 mmol/L 106  106  109   CO2 22 - 32 mmol/L 23  23  24   $ Calcium 8.9 - 10.3 mg/dL 8.0  8.3  8.2   Total Protein 6.5 - 8.1 g/dL 6.8  7.0  7.3   Total Bilirubin 0.3 - 1.2  mg/dL 1.1  0.6  0.6   Alkaline Phos 38 - 126 U/L 84  48  80   AST 15 - 41 U/L 26  16  18   $ ALT 0 - 44 U/L 101  11  49      No results found for: "CEA1", "CEA" / No results found for: "CEA1", "CEA" No results found for: "PSA1" No results found for: "[id number]" No results found for: "[id number]"  Lab  Results  Component Value Date   TOTALPROTELP 6.4 02/22/2022   ALBUMINELP 3.4 02/22/2022   A1GS 0.3 02/22/2022   A2GS 0.6 02/22/2022   BETS 1.1 02/22/2022   GAMS 1.0 02/22/2022   MSPIKE 0.6 (H) 02/22/2022   SPEI Comment 02/22/2022   Lab Results  Component Value Date   TIBC 297 11/12/2019   TIBC 275 09/04/2019   TIBC 269 08/21/2019   FERRITIN 256 11/12/2019   FERRITIN 128 09/04/2019   FERRITIN 131 08/21/2019   IRONPCTSAT 23 11/12/2019   IRONPCTSAT 24 09/04/2019   IRONPCTSAT 25 08/21/2019   Lab Results  Component Value Date   LDH 157 12/22/2021   LDH 158 10/28/2021   LDH 166 09/02/2021     STUDIES:   No results found.

## 2022-03-29 NOTE — Assessment & Plan Note (Signed)
asymptomatic currently On statin

## 2022-03-29 NOTE — Assessment & Plan Note (Signed)
Not in remission On Revlimid Follows up with Oncology Has chronic pain, on Tramadol Has Norco PRN as well for severe pain

## 2022-03-29 NOTE — Progress Notes (Signed)
Patient taking calcium as directed. Denied tooth, jaw, and leg pain. No recent or upcoming dental visits. Labs reviewed. Patient tolerated injection with no complaints voiced. See MAR for details. Patient stable during and after injection. Site clean and dry with no bruising or swelling noted. Band aid applied. Vss with discharge and left in satisfactory condition with no s/s of distress.

## 2022-03-29 NOTE — Progress Notes (Signed)
Patient is taking Revlimid as prescribed.  She has not missed any doses and reports no side effects at this time.

## 2022-03-29 NOTE — Assessment & Plan Note (Addendum)
Lab Results  Component Value Date   HGBA1C 6.4 03/29/2022   Well controlled On metformin and glipizide Advised to follow diabetic diet On statin Diabetic foot exam: Today Diabetic eye exam: Advised to follow up with Ophthalmology for diabetic eye exam  On gabapentin to 300 mg twice daily for DM neuropathy

## 2022-03-29 NOTE — Patient Instructions (Signed)
Please continue taking Metformin and Glipizide as prescribed.  Please continue to follow low carb diet and ambulate as tolerated.

## 2022-03-30 ENCOUNTER — Other Ambulatory Visit: Payer: Self-pay | Admitting: Internal Medicine

## 2022-03-30 ENCOUNTER — Other Ambulatory Visit: Payer: Self-pay | Admitting: Hematology

## 2022-03-30 DIAGNOSIS — C9000 Multiple myeloma not having achieved remission: Secondary | ICD-10-CM

## 2022-03-30 DIAGNOSIS — M170 Bilateral primary osteoarthritis of knee: Secondary | ICD-10-CM

## 2022-03-30 LAB — KAPPA/LAMBDA LIGHT CHAINS
Kappa free light chain: 33.9 mg/L — ABNORMAL HIGH (ref 3.3–19.4)
Kappa, lambda light chain ratio: 1.44 (ref 0.26–1.65)
Lambda free light chains: 23.5 mg/L (ref 5.7–26.3)

## 2022-03-31 LAB — PROTEIN ELECTROPHORESIS, SERUM
A/G Ratio: 1 (ref 0.7–1.7)
Albumin ELP: 3.2 g/dL (ref 2.9–4.4)
Alpha-1-Globulin: 0.3 g/dL (ref 0.0–0.4)
Alpha-2-Globulin: 0.8 g/dL (ref 0.4–1.0)
Beta Globulin: 1.3 g/dL (ref 0.7–1.3)
Gamma Globulin: 0.8 g/dL (ref 0.4–1.8)
Globulin, Total: 3.1 g/dL (ref 2.2–3.9)
M-Spike, %: 0.4 g/dL — ABNORMAL HIGH
Total Protein ELP: 6.3 g/dL (ref 6.0–8.5)

## 2022-03-31 LAB — MICROALBUMIN / CREATININE URINE RATIO
Creatinine, Urine: 201.6 mg/dL
Microalb/Creat Ratio: 21 mg/g creat (ref 0–29)
Microalbumin, Urine: 41.7 ug/mL

## 2022-04-13 ENCOUNTER — Other Ambulatory Visit: Payer: Self-pay

## 2022-04-13 DIAGNOSIS — E876 Hypokalemia: Secondary | ICD-10-CM

## 2022-04-13 MED ORDER — POTASSIUM CHLORIDE CRYS ER 10 MEQ PO TBCR: 20.0000 meq | EXTENDED_RELEASE_TABLET | Freq: Every day | ORAL | 2 refills | Status: DC

## 2022-04-22 ENCOUNTER — Emergency Department (HOSPITAL_COMMUNITY)
Admission: EM | Admit: 2022-04-22 | Discharge: 2022-04-23 | Discharge status: Against Medical Advice | Payer: Medicare Other | Attending: Emergency Medicine | Admitting: Emergency Medicine

## 2022-04-22 ENCOUNTER — Encounter (HOSPITAL_COMMUNITY): Payer: Self-pay

## 2022-04-22 ENCOUNTER — Other Ambulatory Visit: Payer: Self-pay

## 2022-04-22 DIAGNOSIS — R197 Diarrhea, unspecified: Secondary | ICD-10-CM | POA: Diagnosis not present

## 2022-04-22 DIAGNOSIS — C9000 Multiple myeloma not having achieved remission: Secondary | ICD-10-CM | POA: Diagnosis not present

## 2022-04-22 DIAGNOSIS — Z5321 Procedure and treatment not carried out due to patient leaving prior to being seen by health care provider: Secondary | ICD-10-CM | POA: Insufficient documentation

## 2022-04-22 LAB — CBC
HCT: 37.7 % (ref 36.0–46.0)
Hemoglobin: 12 g/dL (ref 12.0–15.0)
MCH: 31.4 pg (ref 26.0–34.0)
MCHC: 31.8 g/dL (ref 30.0–36.0)
MCV: 98.7 fL (ref 80.0–100.0)
Platelets: 194 10*3/uL (ref 150–400)
RBC: 3.82 MIL/uL — ABNORMAL LOW (ref 3.87–5.11)
RDW: 14.8 % (ref 11.5–15.5)
WBC: 9.8 10*3/uL (ref 4.0–10.5)
nRBC: 0.2 % (ref 0.0–0.2)

## 2022-04-22 LAB — COMPREHENSIVE METABOLIC PANEL
ALT: 11 U/L (ref 0–44)
AST: 15 U/L (ref 15–41)
Albumin: 3.5 g/dL (ref 3.5–5.0)
Alkaline Phosphatase: 49 U/L (ref 38–126)
Anion gap: 10 (ref 5–15)
BUN: 18 mg/dL (ref 8–23)
CO2: 21 mmol/L — ABNORMAL LOW (ref 22–32)
Calcium: 8.5 mg/dL — ABNORMAL LOW (ref 8.9–10.3)
Chloride: 108 mmol/L (ref 98–111)
Creatinine, Ser: 0.85 mg/dL (ref 0.44–1.00)
GFR, Estimated: >60 mL/min (ref >60)
Glucose, Bld: 62 mg/dL — ABNORMAL LOW (ref 70–99)
Potassium: 4 mmol/L (ref 3.5–5.1)
Sodium: 139 mmol/L (ref 135–145)
Total Bilirubin: 1 mg/dL (ref 0.3–1.2)
Total Protein: 7 g/dL (ref 6.5–8.1)

## 2022-04-22 LAB — LIPASE, BLOOD: Lipase: 59 U/L — ABNORMAL HIGH (ref 11–51)

## 2022-04-22 MED ORDER — LENALIDOMIDE 10 MG PO CAPS: 10.0000 mg | ORAL_CAPSULE | Freq: Every day | ORAL | 0 refills | Status: DC

## 2022-04-22 NOTE — ED Triage Notes (Signed)
Complaining of diarrhea that has been pretty constant since yesterday. Said she has been to the bathroom 3 times since lunch. Is on chemo for multiple myoloma.

## 2022-04-22 NOTE — Telephone Encounter (Signed)
Chart reviewed. Revlimid refilled per last office note with Dr. Katragadda.  

## 2022-04-26 ENCOUNTER — Inpatient Hospital Stay: Payer: Medicare Other | Attending: Hematology

## 2022-04-26 ENCOUNTER — Inpatient Hospital Stay: Payer: Medicare Other

## 2022-04-26 ENCOUNTER — Encounter: Payer: Self-pay | Admitting: Hematology

## 2022-04-26 ENCOUNTER — Inpatient Hospital Stay (HOSPITAL_BASED_OUTPATIENT_CLINIC_OR_DEPARTMENT_OTHER): Payer: Medicare Other | Admitting: Hematology

## 2022-04-26 VITALS — BP 112/89 | HR 52 | Temp 97.8°F | Resp 18

## 2022-04-26 DIAGNOSIS — C9000 Multiple myeloma not having achieved remission: Secondary | ICD-10-CM

## 2022-04-26 LAB — CBC WITH DIFFERENTIAL/PLATELET
Abs Immature Granulocytes: 0.1 10*3/uL — ABNORMAL HIGH (ref 0.00–0.07)
Basophils Absolute: 0 10*3/uL (ref 0.0–0.1)
Basophils Relative: 1 %
Eosinophils Absolute: 0.2 10*3/uL (ref 0.0–0.5)
Eosinophils Relative: 3 %
HCT: 38.4 % (ref 36.0–46.0)
Hemoglobin: 12 g/dL (ref 12.0–15.0)
Immature Granulocytes: 1 %
Lymphocytes Relative: 44 %
Lymphs Abs: 3.2 10*3/uL (ref 0.7–4.0)
MCH: 31.4 pg (ref 26.0–34.0)
MCHC: 31.3 g/dL (ref 30.0–36.0)
MCV: 100.5 fL — ABNORMAL HIGH (ref 80.0–100.0)
Monocytes Absolute: 0.8 10*3/uL (ref 0.1–1.0)
Monocytes Relative: 11 %
Neutro Abs: 2.9 10*3/uL (ref 1.7–7.7)
Neutrophils Relative %: 40 %
Platelets: 149 10*3/uL — ABNORMAL LOW (ref 150–400)
RBC: 3.82 MIL/uL — ABNORMAL LOW (ref 3.87–5.11)
RDW: 14.9 % (ref 11.5–15.5)
WBC: 7.2 10*3/uL (ref 4.0–10.5)
nRBC: 0 % (ref 0.0–0.2)

## 2022-04-26 LAB — COMPREHENSIVE METABOLIC PANEL
ALT: 11 U/L (ref 0–44)
AST: 15 U/L (ref 15–41)
Albumin: 3.3 g/dL — ABNORMAL LOW (ref 3.5–5.0)
Alkaline Phosphatase: 50 U/L (ref 38–126)
Anion gap: 8 (ref 5–15)
BUN: 17 mg/dL (ref 8–23)
CO2: 24 mmol/L (ref 22–32)
Calcium: 8.3 mg/dL — ABNORMAL LOW (ref 8.9–10.3)
Chloride: 104 mmol/L (ref 98–111)
Creatinine, Ser: 1.01 mg/dL — ABNORMAL HIGH (ref 0.44–1.00)
GFR, Estimated: 57 mL/min — ABNORMAL LOW (ref >60)
Glucose, Bld: 140 mg/dL — ABNORMAL HIGH (ref 70–99)
Potassium: 4.5 mmol/L (ref 3.5–5.1)
Sodium: 136 mmol/L (ref 135–145)
Total Bilirubin: 1.2 mg/dL (ref 0.3–1.2)
Total Protein: 6.9 g/dL (ref 6.5–8.1)

## 2022-04-26 LAB — MAGNESIUM: Magnesium: 1.7 mg/dL (ref 1.7–2.4)

## 2022-04-26 MED ORDER — DENOSUMAB 120 MG/1.7ML ~~LOC~~ SOLN
120.0000 mg | Freq: Once | SUBCUTANEOUS | Status: AC
  Administered 2022-04-26: 120 mg via SUBCUTANEOUS
  Filled 2022-04-26: qty 1.7

## 2022-04-26 NOTE — Patient Instructions (Signed)
[facility]  Discharge Instructions  You were seen and examined today by Dr. Delton Coombes.  Dr. Delton Coombes discussed your most recent lab work which revealed that everything looks good.  You had your Xgeva injection today.  Follow-up as scheduled.    Thank you for choosing [facility] to provide your oncology and hematology care.   To afford each patient quality time with our provider, please arrive at least 15 minutes before your scheduled appointment time. You may need to reschedule your appointment if you arrive late (10 or more minutes). Arriving late affects you and other patients whose appointments are after yours.  Also, if you miss three or more appointments without notifying the office, you may be dismissed from the clinic at the provider's discretion.    Again, thank you for choosing [facility].  Our hope is that these requests will decrease the amount of time that you wait before being seen by our physicians.   If you have a lab appointment with the [facility] please come in thru the Main Entrance and check in at the main information desk.           _____________________________________________________________  Should you have questions after your visit to [facility], please contact our office at [phone] and follow the prompts.  Our office hours are 8:00 a.m. to 4:30 p.m. Monday - Thursday and 8:00 a.m. to 2:30 p.m. Friday.  Please note that voicemails left after 4:00 p.m. may not be returned until the following business day.  We are closed weekends and all major holidays.  You do have access to a nurse 24-7, just call the main number to the clinic [phone] and do not press any options, hold on the line and a nurse will answer the phone.    For prescription refill requests, have your pharmacy contact our office and allow 72 hours.    Masks are optional in the cancer centers. If you  would like for your care team to wear a mask while they are taking care of you, please let them know. You may have one support person who is at least 80 years old accompany you for your appointments.

## 2022-04-26 NOTE — Progress Notes (Signed)
Xgeva injection given per orders. Patient tolerated it well without problems. Vitals stable and discharged home from clinic via wheelchair. Follow up as scheduled.

## 2022-04-26 NOTE — Progress Notes (Signed)
Patient has been assessed, vital signs and labs have been reviewed by Dr. Katragadda. ANC, Creatinine, LFTs, and Platelets are within treatment parameters per Dr. Katragadda. The patient is good to proceed with Xgeva treatment at this time.  Primary RN and pharmacy aware. ° °

## 2022-04-26 NOTE — Progress Notes (Signed)
[facility] [address], [place] [address]    Clinic Day:  04/26/2022  Referring physician: Lindell Spar, MD  Patient Care Team: Lindell Spar, MD as PCP - General (Internal Medicine) Herminio Commons, MD (Inactive) as PCP - Cardiology (Cardiology) Daneil Dolin, MD as Consulting Physician (Gastroenterology) Derek Jack, MD as Medical Oncologist ([place]) Edythe Clarity, [facility] as Pharmacist (Pharmacist)   ASSESSMENT & PLAN:   Assessment: 1.  Normocytic anemia: -Patient seen at the request of Dr. Buelah Manis for further work-up and management of normocytic anemia. -Recent CBC on 08/21/2019 shows hemoglobin 6.9 with MCV of 88.3.  White count and platelets are normal.  No history of CKD.  Denies any bleeding per rectum or melena. Last colonoscopy on 04/01/2014 shows diffusely pigmented rectal mucosa consistent with melanosis [name]  Diffusely pigmented colonic mucosa, 2 diminutive polyps in the mid ascending segment, otherwise the remainder of the colon mucosa was normal.  Biopsy consistent with tubular adenoma. -CT renal study on 05/11/2019 shows normal-sized spleen with normal liver.  No other abnormal adenopathy. -Denies any prior history of blood transfusion.  She is currently taking iron tablet twice daily.   2.  IgG lambda plasma cell myeloma: -Skeletal survey on 09/12/2019 shows diffuse faint lucencies in the skull, bilateral femurs. -Bone marrow biopsy on 09/27/2019 shows 70% plasma cells in hypercellular marrow.  Chromosome analysis was 29, XX. FISH panel was normal.  However quality of specimen is compromised. -LDH normal, beta-2 microglobulin 2.9.  M spike was 2.1 g.  Kappa light chains 19.7, lambda light chains 14.3 with ratio of 1.38. -24-hour urine shows nonnephrotic range proteinuria.  Urine immunofixation was positive. - 9 cycles of RVD from 10/29/2019 through 05/02/2020. - Because of her age and  comorbidities, she was not considered a candidate for bone marrow transplant. - Maintenance Revlimid was held due to severe weakness. - Revlimid 10 mg 2 weeks on/1 week off restarted back on 06/03/2021 due to worsening M spike.    Plan: 1.  IgG lambda plasma cell myeloma: - She is tolerating Revlimid and dexamethasone very well. - Reviewed myeloma labs from 03/29/2022.  M spike improved to 0.4 g from 0.6 g previously.  Free light chain ratio also improved to 1.44 from 1.86.  Kappa light chains at 33.9, down from 51. - Continue Revlimid 10 mg 2 weeks on/1 week off and dexamethasone 20 mg weekly on Tuesdays. - RTC 6 weeks for follow-up with repeat of myeloma labs 2 weeks prior.     2.  Hypomagnesemia: - Continue magnesium 3 times daily.  Magnesium today is 1.7.   3.  Bilateral knee pains: - Continue tramadol 3 to 4 tablets daily as needed.   4.  Bone protection: - Calcium today is 8.3 with albumin of 3.3.  Continue denosumab monthly.  Continue calcium supplements at home.  5.  Hypokalemia: - Continue potassium supplements.  Potassium today is 4.5.    Orders Placed This Encounter  Procedures   Protein electrophoresis, serum    Standing Status:   Future    Standing Expiration Date:   04/26/2023    Order Specific Question:   Release to patient    Answer:   Immediate   Kappa/lambda light chains    Standing Status:   Future    Standing Expiration Date:   04/26/2023   CBC with Differential/Platelet    Standing Status:   Future    Standing Expiration Date:  04/26/2023    Order Specific Question:   Release to patient    Answer:   Immediate   Comprehensive metabolic panel    Standing Status:   Future    Standing Expiration Date:   04/26/2023    Order Specific Question:   Release to patient    Answer:   Immediate     I,Alexis Herring,acting as a scribe for Derek Jack, MD.,have documented all relevant documentation on the behalf of Derek Jack, MD,as directed by   Derek Jack, MD while in the presence of Derek Jack, MD.  I, Derek Jack MD, have reviewed the above documentation for accuracy and completeness, and I agree with the above.    Derek Jack, MD   3/18/20245:37 PM  CHIEF COMPLAINT:   Diagnosis: multiple myeloma & normocytic anemia    Cancer Staging  No matching staging information was found for the patient.   Prior Therapy:    Velcade x 9 cycles from 10/29/2019 to 05/02/2020 Revlimid 2/3 weeks   Current Therapy: Revlimid and dexamethasone   HISTORY OF PRESENT ILLNESS:   Oncology History  Multiple myeloma not having achieved remission ([place])  10/13/2019 Initial Diagnosis   Multiple myeloma not having achieved remission ([place])   10/29/2019 - 05/02/2020 Chemotherapy   Patient is on Treatment Plan : MYELOMA NON-TRANSPLANT CANDIDATES VRd weekly q21d        INTERVAL HISTORY:   Kim Mills is a 82 y.o. female presenting to clinic today for follow up of multiple myeloma & normocytic anemia. She was last seen by me on 03/29/22.  Today, she states that she is doing well overall. Her appetite level is at 75%. Her energy level is at 20%.  She denies any fevers or infections in the last 4 weeks.   PAST MEDICAL HISTORY:   Past Medical History: Past Medical History:  Diagnosis Date   Arthritis    Cancer ([place])    Cataract    Chest pain    Associated with weakness and fatigue   Diabetes mellitus    A1c of 7.4 in 08/2010   GERD (gastroesophageal reflux disease)    Glaucoma    Hyperlipidemia    Lipid profile in 08/2010:190, 121, 48, 118; normal CBC and CMet   Hypertension    Lipid profile in 08/2010:190, 121, 48, 118; normal CBC and CMet   Hypothyroidism    Hypothyroidism    Obesity     Surgical History: Past Surgical History:  Procedure Laterality Date   CHOLECYSTECTOMY     COLONOSCOPY N/A 04/01/2014   RMR: Melanosis coli. colonic polyps removed as described above.    CYSTOSCOPY W/ URETERAL STENT  PLACEMENT Right 09/13/2019   Procedure: CYSTOSCOPY WITH RIGHT  RETROGRADE PYELOGRAM; BLADDER BIOPSY;  Surgeon: Cleon Gustin, MD;  Location: [facility];  Service: Urology;  Laterality: Right;   LEFT HEART CATH AND CORONARY ANGIOGRAPHY N/A 10/07/2017   Procedure: LEFT HEART CATH AND CORONARY ANGIOGRAPHY;  Surgeon: Troy Sine, MD;  Location: [facility];  Service: Cardiovascular;  Laterality: N/A;   THYROIDECTOMY, PARTIAL      Social History: Social History   Socioeconomic History   Marital status: Widowed    Spouse name: Not on file   Number of children: 7   Years of education: Not on file   Highest education level: Not on file  Occupational History    Employer: RETIRED  Tobacco Use   Smoking status: Never    Passive exposure: Yes   Smokeless tobacco: Never  Vaping Use   Vaping Use: Never used  Substance and Sexual Activity   Alcohol use: Never   Drug use: Never   Sexual activity: Not Currently  Other Topics Concern   Not on file  Social History Narrative   Daughter, Corky Sing, lives with and assists patient.    Social Determinants of Health   Financial Resource Strain: Low Risk  (11/05/2020)   Overall Financial Resource Strain (CARDIA)    Difficulty of Paying Living Expenses: Not hard at all  Food Insecurity: No Food Insecurity (11/05/2020)   Hunger Vital Sign    Worried About Running Out of Food in the Last Year: Never true    Ran Out of Food in the Last Year: Never true  Transportation Needs: No Transportation Needs (11/05/2020)   PRAPARE - Hydrologist (Medical): No    Lack of Transportation (Non-Medical): No  Physical Activity: Sufficiently Active (11/05/2020)   Exercise Vital Sign    Days of Exercise per Week: 5 days    Minutes of Exercise per Session: 30 min  Stress: No Stress Concern Present (11/05/2020)   [place]    Feeling of Stress : Not at all  Social  Connections: Moderately Integrated (11/05/2020)   Social Connection and Isolation Panel [NHANES]    Frequency of Communication with Friends and Family: More than three times a week    Frequency of Social Gatherings with Friends and Family: More than three times a week    Attends Religious Services: 1 to 4 times per year    Active Member of [employer] or Organizations: Yes    Attends Archivist Meetings: 1 to 4 times per year    Marital Status: Widowed  Intimate Partner Violence: Not At Risk (11/05/2020)   Humiliation, Afraid, Rape, and Kick questionnaire    Fear of Current or Ex-Partner: No    Emotionally Abused: No    Physically Abused: No    Sexually Abused: No    Family History: Family History  Problem Relation Age of Onset   Anemia Father    Arthritis Mother    Stroke Brother    Cancer Sister        unknown kind   Diabetes Sister    Dementia Sister    Cancer Niece        Breast   Cancer Brother    Healthy Son    Healthy Son    Healthy Son    Healthy Daughter    Healthy Daughter    Healthy Daughter    Sarcoidosis Daughter     Current Medications:  Current Outpatient Medications:    albuterol (VENTOLIN HFA) 108 (90 Base) MCG/ACT inhaler, Inhale 2 puffs into the lungs every 6 (six) hours as needed for wheezing or shortness of breath., Disp: 8 g, Rfl: 0   ASPIRIN 81 PO, Take 1 tablet by mouth daily., Disp: , Rfl:    blood glucose meter kit and supplies, Dispense based on patient and insurance preference. Use up to four times daily as directed. (FOR ICD-10 E10.9, E11.9)., Disp: 1 each, Rfl: 0   Blood Glucose Monitoring Suppl (BLOOD GLUCOSE SYSTEM PAK) KIT, Please dispense based on patient and insurance preference. Use as directed to monitor FSBS 2x daily. Dx: E11.9., Disp: 1 each, Rfl: 1   Calcium 500 MG tablet, Take 600 mg by mouth 2 (two) times daily., Disp: , Rfl:    dexamethasone (DECADRON) 4 MG tablet, Take  5 tablets (20 mg total) by mouth once a week., Disp: 20  tablet, Rfl: 5   fluticasone (FLONASE) 50 MCG/ACT nasal spray, Place 2 sprays into both nostrils daily., Disp: 16 g, Rfl: 6   gabapentin (NEURONTIN) 300 MG capsule, TAKE ONE CAPSULE (300MG  TOTAL) BY MOUTH TWO TIMES DAILY, Disp: 90 capsule, Rfl: 1   glipiZIDE (GLUCOTROL) 5 MG tablet, TAKE ONE TABLET (5MG  TOTAL) BY MOUTH TWO TIMES DAILY BEFORE A MEAL, Disp: 90 tablet, Rfl: 1   Glucose Blood (BLOOD GLUCOSE TEST STRIPS) STRP, Please dispense based on patient and insurance preference. Use as directed to monitor FSBS 2x daily. Dx: E11.9., Disp: 100 each, Rfl: 11   HM LORATADINE 10 MG tablet, TAKE ONE TABLET (10MG  TOTAL) BY MOUTH DAILY (Patient taking differently: Take 10 mg by mouth daily as needed for allergies.), Disp: 30 tablet, Rfl: 0   HYDROcodone-acetaminophen (NORCO/VICODIN) 5-325 MG tablet, TAKE ONE (1) TABLET BY MOUTH EVERY 12 HOURS AS NEEDED FOR MODERATE PAIN, Disp: 60 tablet, Rfl: 0   Lancets MISC, Please dispense based on patient and insurance preference. Use as directed to monitor FSBS 2x daily. Dx: E11.9., Disp: 100 each, Rfl: 11   lenalidomide (REVLIMID) 10 MG capsule, Take 1 capsule (10 mg total) by mouth daily. 14 days on, 7 days off, Disp: 14 capsule, Rfl: 0   levothyroxine (SYNTHROID) 137 MCG tablet, Take 1 tablet (137 mcg total) by mouth daily before breakfast., Disp: 90 tablet, Rfl: 1   losartan (COZAAR) 25 MG tablet, Take 1 tablet (25 mg total) by mouth daily., Disp: 90 tablet, Rfl: 1   magic mouthwash w/lidocaine SOLN, Take 5 mLs by mouth 4 (four) times daily., Disp: 450 mL, Rfl: 0   magnesium oxide (MAG-OX) 400 (240 Mg) MG tablet, Take 1 tablet (400 mg total) by mouth 3 (three) times daily., Disp: 90 tablet, Rfl: 6   meclizine (ANTIVERT) 25 MG tablet, Take 25 mg by mouth every 6 (six) hours as needed for dizziness., Disp: , Rfl:    metFORMIN (GLUCOPHAGE) 1000 MG tablet, Take 1 tablet (1,000 mg total) by mouth 2 (two) times daily., Disp: 60 tablet, Rfl: 3   metoprolol tartrate  (LOPRESSOR) 25 MG tablet, TAKE ONE-HALF TABLET (12.5MG  TOTAL) BY MOUTH TWO TIMES DAILY, Disp: 90 tablet, Rfl: 3   omeprazole (PRILOSEC) 40 MG capsule, TAKE ONE CAPSULE BY MOUTH DAILY, Disp: 90 capsule, Rfl: 3   polyethylene glycol (MIRALAX / GLYCOLAX) 17 g packet, Take 17 g by mouth daily., Disp: 14 each, Rfl: 0   potassium chloride (KLOR-CON M) 10 MEQ tablet, Take 2 tablets (20 mEq total) by mouth daily., Disp: 60 tablet, Rfl: 2   potassium chloride (KLOR-CON) 10 MEQ tablet, Take 20 mEq by mouth daily., Disp: , Rfl:    simvastatin (ZOCOR) 40 MG tablet, TAKE ONE TABLET (40MG  TOTAL) BY MOUTH BEDTIME, Disp: 90 tablet, Rfl: 3   traMADol (ULTRAM) 50 MG tablet, TAKE ONE TABLET (50MG  TOTAL) BY MOUTH EVERY SIX HOURS AS NEEDED, Disp: 120 tablet, Rfl: 0   zinc oxide (MEIJER ZINC OXIDE) 20 % ointment, Apply 1 application topically as needed for irritation., Disp: 56.7 g, Rfl: 0   Allergies: No Known Allergies  REVIEW OF SYSTEMS:   Review of Systems  Constitutional:  Negative for chills, fatigue and fever.  HENT:   Negative for lump/mass, mouth sores, nosebleeds, sore throat and trouble swallowing.   Eyes:  Negative for eye problems.  Respiratory:  Negative for cough and shortness of breath.   Cardiovascular:  Negative  for chest pain, leg swelling and palpitations.  Gastrointestinal:  Negative for abdominal pain, constipation, diarrhea, nausea and vomiting.  Genitourinary:  Negative for bladder incontinence, difficulty urinating, dysuria, frequency, hematuria and nocturia.   Musculoskeletal:  Negative for arthralgias, back pain, flank pain, myalgias and neck pain.  Skin:  Negative for itching and rash.  Neurological:  Positive for numbness. Negative for dizziness and headaches.  Hematological:  Does not bruise/bleed easily.  Psychiatric/Behavioral:  Negative for depression, sleep disturbance and suicidal ideas. The patient is not nervous/anxious.   All other systems reviewed and are negative.     VITALS:   Blood pressure 112/89, pulse (!) 52, temperature 97.8 F (36.6 C), temperature source Oral, resp. rate 18, SpO2 100 %.  Wt Readings from Last 3 Encounters:  04/22/22 175 lb (79.4 kg)  03/01/22 166 lb (75.3 kg)  11/03/21 155 lb (70.3 kg)    There is no height or weight on file to calculate BMI.  Performance status (ECOG): 1 - Symptomatic but completely ambulatory  PHYSICAL EXAM:   Physical Exam Vitals and nursing note reviewed. Exam conducted with a chaperone present.  Constitutional:      Appearance: Normal appearance.  Cardiovascular:     Rate and Rhythm: Normal rate and regular rhythm.     Pulses: Normal pulses.     Heart sounds: Normal heart sounds.  Pulmonary:     Effort: Pulmonary effort is normal.     Breath sounds: Normal breath sounds.  Abdominal:     Palpations: Abdomen is soft. There is no hepatomegaly, splenomegaly or mass.     Tenderness: There is no abdominal tenderness.  Musculoskeletal:     Right lower leg: No edema.     Left lower leg: No edema.  Lymphadenopathy:     Cervical: No cervical adenopathy.     Right cervical: No superficial, deep or posterior cervical adenopathy.    Left cervical: No superficial, deep or posterior cervical adenopathy.     Upper Body:     Right upper body: No supraclavicular or axillary adenopathy.     Left upper body: No supraclavicular or axillary adenopathy.  Neurological:     General: No focal deficit present.     Mental Status: She is alert and oriented to person, place, and time.  Psychiatric:        Mood and Affect: Mood normal.        Behavior: Behavior normal.     LABS:      Latest Ref Rng & Units 04/26/2022   12:53 PM 04/22/2022    8:21 PM 03/29/2022   10:10 AM  CBC  WBC 4.0 - 10.5 K/uL 7.2  9.8  8.1   Hemoglobin 12.0 - 15.0 g/dL 12.0  12.0  11.0   Hematocrit 36.0 - 46.0 % 38.4  37.7  37.1   Platelets 150 - 400 K/uL 149  194  199       Latest Ref Rng & Units 04/26/2022   12:53 PM 04/22/2022     8:21 PM 03/29/2022   10:10 AM  CMP  Glucose 70 - 99 mg/dL 140  62  148   BUN 8 - 23 mg/dL 17  18  21    Creatinine 0.44 - 1.00 mg/dL 1.01  0.85  1.14   Sodium 135 - 145 mmol/L 136  139  138   Potassium 3.5 - 5.1 mmol/L 4.5  4.0  3.4   Chloride 98 - 111 mmol/L 104  108  106   CO2  22 - 32 mmol/L 24  21  23    Calcium 8.9 - 10.3 mg/dL 8.3  8.5  8.0   Total Protein 6.5 - 8.1 g/dL 6.9  7.0  6.8   Total Bilirubin 0.3 - 1.2 mg/dL 1.2  1.0  1.1   Alkaline Phos 38 - 126 U/L 50  49  84   AST 15 - 41 U/L 15  15  26    ALT 0 - 44 U/L 11  11  101      No results found for: "CEA1", "CEA" / No results found for: "CEA1", "CEA" No results found for: "PSA1" No results found for: "CAN199" No results found for: "CAN125"  Lab Results  Component Value Date   TOTALPROTELP 6.3 03/29/2022   ALBUMINELP 3.2 03/29/2022   A1GS 0.3 03/29/2022   A2GS 0.8 03/29/2022   BETS 1.3 03/29/2022   GAMS 0.8 03/29/2022   MSPIKE 0.4 (H) 03/29/2022   SPEI Comment 03/29/2022   Lab Results  Component Value Date   TIBC 297 11/12/2019   TIBC 275 09/04/2019   TIBC 269 08/21/2019   FERRITIN 256 11/12/2019   FERRITIN 128 09/04/2019   FERRITIN 131 08/21/2019   IRONPCTSAT 23 11/12/2019   IRONPCTSAT 24 09/04/2019   IRONPCTSAT 25 08/21/2019   Lab Results  Component Value Date   LDH 157 12/22/2021   LDH 158 10/28/2021   LDH 166 09/02/2021     STUDIES:   No results found.

## 2022-04-28 ENCOUNTER — Telehealth: Payer: Self-pay

## 2022-04-28 NOTE — Telephone Encounter (Signed)
        Patient  visited [facility] on 3/15   Telephone encounter attempt :1st    A HIPAA compliant voice message was left requesting a return call.  Instructed patient to call back.    [facility] [phone] [address], [place], [place] [address] Phone: [phone] Email: [email or web address]

## 2022-04-29 ENCOUNTER — Telehealth: Payer: Self-pay

## 2022-04-29 LAB — KAPPA/LAMBDA LIGHT CHAINS
Kappa free light chain: 64.7 mg/L — ABNORMAL HIGH (ref 3.3–19.4)
Kappa, lambda light chain ratio: 2.84 — ABNORMAL HIGH (ref 0.26–1.65)
Lambda free light chains: 22.8 mg/L (ref 5.7–26.3)

## 2022-04-29 NOTE — Telephone Encounter (Signed)
        Patient  visited [facility] on 3/15     Telephone encounter attempt : 2nd   A HIPAA compliant voice message was left requesting a return call.  Instructed patient to call back .    [facility] [phone] [address], [place], [place] [address] Phone: [phone] Email: [email or web address]

## 2022-04-30 ENCOUNTER — Telehealth: Payer: Self-pay

## 2022-04-30 NOTE — Progress Notes (Unsigned)
This encounter was created in error - please disregard.

## 2022-04-30 NOTE — Telephone Encounter (Signed)
     Patient  visit on 3/16  at [facility] you been able to follow up with your primary care physician? Yes   The patient was or was not able to obtain any needed medicine or equipment. Yes    Are there diet recommendations that you are having difficulty following? Na   Patient expresses understanding of discharge instructions and education provided has no other needs at this time.  Yes     [facility] [phone] [address], [place], [place] [address] Phone: [phone] Email: [email or web address]

## 2022-05-03 ENCOUNTER — Other Ambulatory Visit: Payer: Self-pay | Admitting: Hematology

## 2022-05-03 ENCOUNTER — Other Ambulatory Visit: Payer: Self-pay | Admitting: Internal Medicine

## 2022-05-03 DIAGNOSIS — M170 Bilateral primary osteoarthritis of knee: Secondary | ICD-10-CM

## 2022-05-03 DIAGNOSIS — C9000 Multiple myeloma not having achieved remission: Secondary | ICD-10-CM

## 2022-05-20 ENCOUNTER — Other Ambulatory Visit: Payer: Self-pay

## 2022-05-20 MED ORDER — LENALIDOMIDE 10 MG PO CAPS: 10.0000 mg | ORAL_CAPSULE | Freq: Every day | ORAL | 0 refills | Status: DC

## 2022-05-20 NOTE — Telephone Encounter (Signed)
Chart reviewed. Revlimid refilled per last office note with Dr. Katragadda.  

## 2022-05-24 ENCOUNTER — Other Ambulatory Visit: Payer: Medicare Other

## 2022-05-24 ENCOUNTER — Ambulatory Visit: Payer: Medicare Other

## 2022-05-24 ENCOUNTER — Inpatient Hospital Stay: Payer: Medicare Other

## 2022-05-24 ENCOUNTER — Inpatient Hospital Stay: Payer: Medicare Other | Attending: Hematology

## 2022-05-24 VITALS — BP 150/59 | HR 59 | Temp 97.2°F | Resp 17

## 2022-05-24 DIAGNOSIS — C9000 Multiple myeloma not having achieved remission: Secondary | ICD-10-CM | POA: Diagnosis not present

## 2022-05-24 DIAGNOSIS — Z79899 Other long term (current) drug therapy: Secondary | ICD-10-CM | POA: Insufficient documentation

## 2022-05-24 DIAGNOSIS — R34 Anuria and oliguria: Secondary | ICD-10-CM

## 2022-05-24 LAB — URINALYSIS, ROUTINE W REFLEX MICROSCOPIC
Bilirubin Urine: NEGATIVE
Glucose, UA: NEGATIVE mg/dL
Hgb urine dipstick: NEGATIVE
Ketones, ur: NEGATIVE mg/dL
Nitrite: NEGATIVE
Protein, ur: 100 mg/dL — AB
Specific Gravity, Urine: 1.021 (ref 1.005–1.030)
WBC, UA: >50 WBC/hpf (ref 0–5)
pH: 5 (ref 5.0–8.0)

## 2022-05-24 LAB — COMPREHENSIVE METABOLIC PANEL
ALT: 19 U/L (ref 0–44)
AST: 21 U/L (ref 15–41)
Albumin: 3.4 g/dL — ABNORMAL LOW (ref 3.5–5.0)
Alkaline Phosphatase: 62 U/L (ref 38–126)
Anion gap: 6 (ref 5–15)
BUN: 14 mg/dL (ref 8–23)
CO2: 20 mmol/L — ABNORMAL LOW (ref 22–32)
Calcium: 8.2 mg/dL — ABNORMAL LOW (ref 8.9–10.3)
Chloride: 110 mmol/L (ref 98–111)
Creatinine, Ser: 0.8 mg/dL (ref 0.44–1.00)
GFR, Estimated: >60 mL/min (ref >60)
Glucose, Bld: 50 mg/dL — ABNORMAL LOW (ref 70–99)
Potassium: 4.2 mmol/L (ref 3.5–5.1)
Sodium: 136 mmol/L (ref 135–145)
Total Bilirubin: 0.4 mg/dL (ref 0.3–1.2)
Total Protein: 7.1 g/dL (ref 6.5–8.1)

## 2022-05-24 LAB — CBC WITH DIFFERENTIAL/PLATELET
Abs Immature Granulocytes: 0.08 10*3/uL — ABNORMAL HIGH (ref 0.00–0.07)
Basophils Absolute: 0.1 10*3/uL (ref 0.0–0.1)
Basophils Relative: 1 %
Eosinophils Absolute: 0.1 10*3/uL (ref 0.0–0.5)
Eosinophils Relative: 2 %
HCT: 38.6 % (ref 36.0–46.0)
Hemoglobin: 12 g/dL (ref 12.0–15.0)
Immature Granulocytes: 1 %
Lymphocytes Relative: 39 %
Lymphs Abs: 2.3 10*3/uL (ref 0.7–4.0)
MCH: 30.8 pg (ref 26.0–34.0)
MCHC: 31.1 g/dL (ref 30.0–36.0)
MCV: 99.2 fL (ref 80.0–100.0)
Monocytes Absolute: 0.4 10*3/uL (ref 0.1–1.0)
Monocytes Relative: 7 %
Neutro Abs: 3.1 10*3/uL (ref 1.7–7.7)
Neutrophils Relative %: 50 %
Platelets: 186 10*3/uL (ref 150–400)
RBC: 3.89 MIL/uL (ref 3.87–5.11)
RDW: 15.1 % (ref 11.5–15.5)
WBC: 6.1 10*3/uL (ref 4.0–10.5)
nRBC: 0.8 % — ABNORMAL HIGH (ref 0.0–0.2)

## 2022-05-24 MED ORDER — DENOSUMAB 120 MG/1.7ML ~~LOC~~ SOLN
120.0000 mg | Freq: Once | SUBCUTANEOUS | Status: AC
  Administered 2022-05-24: 120 mg via SUBCUTANEOUS
  Filled 2022-05-24: qty 1.7

## 2022-05-24 NOTE — Progress Notes (Signed)
Creatinine 0.80. Patient presents today for Xgeva injection. Calcium 8.2. Patient taking Calcium 500 mg tablet PO BID. Patient's son at the bedside and requesting a urine to be collected. Patient denies any burning with voiding or issues with emptying her bladder. Patient denies any upcoming dental work. Patient denies jaw pain. Patient has no complaints today.   Urine collected today and sent to the lab. Ua and culture ordered.   Injection given today per MD orders. Tolerated without adverse affects. Vital signs stable. No complaints at this time. Discharged from clinic by wheel chair and accompanied by her son,  in stable condition. Alert and oriented x 3. F/U with [facility] as scheduled.

## 2022-05-24 NOTE — Patient Instructions (Signed)
[facility]  Discharge Instructions: Thank you for choosing [facility] to provide your oncology and hematology care.  If you have a lab appointment with the Cancer Center - please note that after April 8th, 2024, all labs will be drawn in the cancer center.  You do not have to check in or register with the main entrance as you have in the past but will complete your check-in in the cancer center.  Wear comfortable clothing and clothing appropriate for easy access to any Portacath or PICC line.   We strive to give you quality time with your provider. You may need to reschedule your appointment if you arrive late (15 or more minutes).  Arriving late affects you and other patients whose appointments are after yours.  Also, if you miss three or more appointments without notifying the office, you may be dismissed from the clinic at the provider's discretion.      For prescription refill requests, have your pharmacy contact our office and allow 72 hours for refills to be completed.    Today you received the following chemotherapy and/or immunotherapy agents Xgeva injection.  Denosumab Injection (Oncology) What is this medication? DENOSUMAB (den oh SUE mab) prevents weakened bones caused by cancer. It may also be used to treat noncancerous bone tumors that cannot be removed by surgery. It can also be used to treat high calcium levels in the blood caused by cancer. It works by blocking a protein that causes bones to break down quickly. This slows down the release of calcium from bones, which lowers calcium levels in your blood. It also makes your bones stronger and less likely to break (fracture). This medicine may be used for other purposes; ask your health care provider or pharmacist if you have questions. COMMON BRAND NAME(S): XGEVA What should I tell my care team before I take this medication? They need to know if you have any of these conditions: Dental  disease Having surgery or tooth extraction Infection Kidney disease Low levels of calcium or vitamin D in the blood Malnutrition On hemodialysis Skin conditions or sensitivity Thyroid or parathyroid disease An unusual reaction to denosumab, other medications, foods, dyes, or preservatives Pregnant or trying to get pregnant Breast-feeding How should I use this medication? This medication is for injection under the skin. It is given by your care team in a hospital or clinic setting. A special MedGuide will be given to you before each treatment. Be sure to read this information carefully each time. Talk to your care team about the use of this medication in children. While it may be prescribed for children as young as 13 years for selected conditions, precautions do apply. Overdosage: If you think you have taken too much of this medicine contact a poison control center or emergency room at once. NOTE: This medicine is only for you. Do not share this medicine with others. What if I miss a dose? Keep appointments for follow-up doses. It is important not to miss your dose. Call your care team if you are unable to keep an appointment. What may interact with this medication? Do not take this medication with any of the following: Other medications containing denosumab This medication may also interact with the following: Medications that lower your chance of fighting infection Steroid medications, such as prednisone or cortisone This list may not describe all possible interactions. Give your health care provider a list of all the medicines, herbs, non-prescription drugs, or dietary supplements you  use. Also tell them if you smoke, drink alcohol, or use illegal drugs. Some items may interact with your medicine. What should I watch for while using this medication? Your condition will be monitored carefully while you are receiving this medication. You may need blood work while taking this  medication. This medication may increase your risk of getting an infection. Call your care team for advice if you get a fever, chills, sore throat, or other symptoms of a cold or flu. Do not treat yourself. Try to avoid being around people who are sick. You should make sure you get enough calcium and vitamin D while you are taking this medication, unless your care team tells you not to. Discuss the foods you eat and the vitamins you take with your care team. Some people who take this medication have severe bone, joint, or muscle pain. This medication may also increase your risk for jaw problems or a broken thigh bone. Tell your care team right away if you have severe pain in your jaw, bones, joints, or muscles. Tell your care team if you have any pain that does not go away or that gets worse. Talk to your care team if you may be pregnant. Serious birth defects can occur if you take this medication during pregnancy and for 5 months after the last dose. You will need a negative pregnancy test before starting this medication. Contraception is recommended while taking this medication and for 5 months after the last dose. Your care team can help you find the option that works for you. What side effects may I notice from receiving this medication? Side effects that you should report to your care team as soon as possible: Allergic reactions--skin rash, itching, hives, swelling of the face, lips, tongue, or throat Bone, joint, or muscle pain Low calcium level--muscle pain or cramps, confusion, tingling, or numbness in the hands or feet Osteonecrosis of the jaw--pain, swelling, or redness in the mouth, numbness of the jaw, poor healing after dental work, unusual discharge from the mouth, visible bones in the mouth Side effects that usually do not require medical attention (report to your care team if they continue or are bothersome): Cough Diarrhea Fatigue Headache Nausea This list may not describe all  possible side effects. Call your doctor for medical advice about side effects. You may report side effects to FDA at 1-800-FDA-1088. Where should I keep my medication? This medication is given in a hospital or clinic. It will not be stored at home. NOTE: This sheet is a summary. It may not cover all possible information. If you have questions about this medicine, talk to your doctor, pharmacist, or health care provider.  2023 Elsevier/Gold Standard (2021-06-15 00:00:00)       To help prevent nausea and vomiting after your treatment, we encourage you to take your nausea medication as directed.  BELOW ARE SYMPTOMS THAT SHOULD BE REPORTED IMMEDIATELY: *FEVER GREATER THAN 100.4 F (38 C) OR HIGHER *CHILLS OR SWEATING *NAUSEA AND VOMITING THAT IS NOT CONTROLLED WITH YOUR NAUSEA MEDICATION *UNUSUAL SHORTNESS OF BREATH *UNUSUAL BRUISING OR BLEEDING *URINARY PROBLEMS (pain or burning when urinating, or frequent urination) *BOWEL PROBLEMS (unusual diarrhea, constipation, pain near the anus) TENDERNESS IN MOUTH AND THROAT WITH OR WITHOUT PRESENCE OF ULCERS (sore throat, sores in mouth, or a toothache) UNUSUAL RASH, SWELLING OR PAIN  UNUSUAL VAGINAL DISCHARGE OR ITCHING   Items with * indicate a potential emergency and should be followed up as soon as possible or go to the  Emergency Department if any problems should occur.  Please show the CHEMOTHERAPY ALERT CARD or IMMUNOTHERAPY ALERT CARD at check-in to the Emergency Department and triage nurse.  Should you have questions after your visit or need to cancel or reschedule your appointment, please contact [facility] [phone]  and follow the prompts.  Office hours are 8:00 a.m. to 4:30 p.m. Monday - Friday. Please note that voicemails left after 4:00 p.m. may not be returned until the following business day.  We are closed weekends and major holidays. You have access to a nurse at all times for urgent questions. Please call  the main number to the clinic [phone] and follow the prompts.  For any non-urgent questions, you may also contact your provider using MyChart. We now offer e-Visits for anyone 47 and older to request care online for non-urgent symptoms. For details visit [email or web address].   Also download the MyChart app! Go to the app store, search "MyChart", open the app, select [place], and log in with your MyChart username and password.

## 2022-05-25 LAB — KAPPA/LAMBDA LIGHT CHAINS
Kappa free light chain: 31.3 mg/L — ABNORMAL HIGH (ref 3.3–19.4)
Kappa, lambda light chain ratio: 1.25 (ref 0.26–1.65)
Lambda free light chains: 25 mg/L (ref 5.7–26.3)

## 2022-05-26 LAB — PROTEIN ELECTROPHORESIS, SERUM
A/G Ratio: 1.1 (ref 0.7–1.7)
Albumin ELP: 3.4 g/dL (ref 2.9–4.4)
Alpha-1-Globulin: 0.3 g/dL (ref 0.0–0.4)
Alpha-2-Globulin: 0.8 g/dL (ref 0.4–1.0)
Beta Globulin: 1.2 g/dL (ref 0.7–1.3)
Gamma Globulin: 0.8 g/dL (ref 0.4–1.8)
Globulin, Total: 3 g/dL (ref 2.2–3.9)
M-Spike, %: 0.4 g/dL — ABNORMAL HIGH
Total Protein ELP: 6.4 g/dL (ref 6.0–8.5)

## 2022-05-26 LAB — URINE CULTURE: Culture: >=100000 — AB

## 2022-05-27 ENCOUNTER — Telehealth: Payer: Self-pay | Admitting: *Deleted

## 2022-05-27 ENCOUNTER — Other Ambulatory Visit: Payer: Self-pay | Admitting: *Deleted

## 2022-05-27 MED ORDER — CIPROFLOXACIN HCL 500 MG PO TABS: 500.0000 mg | ORAL_TABLET | Freq: Two times a day (BID) | ORAL | 0 refills | Status: AC

## 2022-05-27 NOTE — Progress Notes (Signed)
Per Dr. Ellin Saba, Cipro 500 mg bid x 5 days sent to pharmacy.  Daughter aware.

## 2022-05-27 NOTE — Telephone Encounter (Signed)
Spoke to daughter Iris to advise that patient has UTI and Dr. Ellin Saba has sent Cipro 500 mg twice a day x 5 days.  Daughter expressed that she has been extremely confused and seeing people that are not there for > 1 week.  Advised that she would need to go to the ER, as UTI may have gotten out of hand and may require further management.  She stated that she was not sure she could get her there but would try.  Dr. Ellin Saba made aware.

## 2022-05-31 ENCOUNTER — Other Ambulatory Visit: Payer: Self-pay | Admitting: Internal Medicine

## 2022-05-31 DIAGNOSIS — M170 Bilateral primary osteoarthritis of knee: Secondary | ICD-10-CM

## 2022-05-31 DIAGNOSIS — C9000 Multiple myeloma not having achieved remission: Secondary | ICD-10-CM

## 2022-06-01 ENCOUNTER — Encounter (HOSPITAL_COMMUNITY): Payer: Self-pay | Admitting: Emergency Medicine

## 2022-06-01 ENCOUNTER — Emergency Department (HOSPITAL_COMMUNITY): Payer: Medicare Other

## 2022-06-01 ENCOUNTER — Other Ambulatory Visit: Payer: Self-pay

## 2022-06-01 ENCOUNTER — Emergency Department (HOSPITAL_COMMUNITY)
Admission: EM | Admit: 2022-06-01 | Discharge: 2022-06-01 | Disposition: A | Discharge status: Home / Self Care | Payer: Medicare Other | Attending: Emergency Medicine | Admitting: Emergency Medicine

## 2022-06-01 DIAGNOSIS — Z8579 Personal history of other malignant neoplasms of lymphoid, hematopoietic and related tissues: Secondary | ICD-10-CM | POA: Diagnosis not present

## 2022-06-01 DIAGNOSIS — X58XXXA Exposure to other specified factors, initial encounter: Secondary | ICD-10-CM | POA: Insufficient documentation

## 2022-06-01 DIAGNOSIS — S31809A Unspecified open wound of unspecified buttock, initial encounter: Secondary | ICD-10-CM | POA: Diagnosis not present

## 2022-06-01 DIAGNOSIS — I129 Hypertensive chronic kidney disease with stage 1 through stage 4 chronic kidney disease, or unspecified chronic kidney disease: Secondary | ICD-10-CM | POA: Insufficient documentation

## 2022-06-01 DIAGNOSIS — Z79899 Other long term (current) drug therapy: Secondary | ICD-10-CM | POA: Diagnosis not present

## 2022-06-01 DIAGNOSIS — Z7984 Long term (current) use of oral hypoglycemic drugs: Secondary | ICD-10-CM | POA: Insufficient documentation

## 2022-06-01 DIAGNOSIS — E1122 Type 2 diabetes mellitus with diabetic chronic kidney disease: Secondary | ICD-10-CM | POA: Diagnosis not present

## 2022-06-01 DIAGNOSIS — N189 Chronic kidney disease, unspecified: Secondary | ICD-10-CM | POA: Insufficient documentation

## 2022-06-01 DIAGNOSIS — R051 Acute cough: Secondary | ICD-10-CM | POA: Diagnosis not present

## 2022-06-01 DIAGNOSIS — R059 Cough, unspecified: Secondary | ICD-10-CM | POA: Diagnosis not present

## 2022-06-01 LAB — CBC WITH DIFFERENTIAL/PLATELET
Abs Immature Granulocytes: 0.17 10*3/uL — ABNORMAL HIGH (ref 0.00–0.07)
Basophils Absolute: 0 10*3/uL (ref 0.0–0.1)
Basophils Relative: 0 %
Eosinophils Absolute: 0.1 10*3/uL (ref 0.0–0.5)
Eosinophils Relative: 1 %
HCT: 36.4 % (ref 36.0–46.0)
Hemoglobin: 11.3 g/dL — ABNORMAL LOW (ref 12.0–15.0)
Immature Granulocytes: 3 %
Lymphocytes Relative: 31 %
Lymphs Abs: 2.1 10*3/uL (ref 0.7–4.0)
MCH: 30.7 pg (ref 26.0–34.0)
MCHC: 31 g/dL (ref 30.0–36.0)
MCV: 98.9 fL (ref 80.0–100.0)
Monocytes Absolute: 0.2 10*3/uL (ref 0.1–1.0)
Monocytes Relative: 3 %
Neutro Abs: 4.4 10*3/uL (ref 1.7–7.7)
Neutrophils Relative %: 62 %
Platelets: 200 10*3/uL (ref 150–400)
RBC: 3.68 MIL/uL — ABNORMAL LOW (ref 3.87–5.11)
RDW: 15 % (ref 11.5–15.5)
WBC: 6.9 10*3/uL (ref 4.0–10.5)
nRBC: 0 % (ref 0.0–0.2)

## 2022-06-01 LAB — COMPREHENSIVE METABOLIC PANEL
ALT: 19 U/L (ref 0–44)
AST: 22 U/L (ref 15–41)
Albumin: 3.1 g/dL — ABNORMAL LOW (ref 3.5–5.0)
Alkaline Phosphatase: 61 U/L (ref 38–126)
Anion gap: 10 (ref 5–15)
BUN: 20 mg/dL (ref 8–23)
CO2: 25 mmol/L (ref 22–32)
Calcium: 9.1 mg/dL (ref 8.9–10.3)
Chloride: 100 mmol/L (ref 98–111)
Creatinine, Ser: 1.09 mg/dL — ABNORMAL HIGH (ref 0.44–1.00)
GFR, Estimated: 52 mL/min — ABNORMAL LOW (ref >60)
Glucose, Bld: 212 mg/dL — ABNORMAL HIGH (ref 70–99)
Potassium: 4.6 mmol/L (ref 3.5–5.1)
Sodium: 135 mmol/L (ref 135–145)
Total Bilirubin: 0.8 mg/dL (ref 0.3–1.2)
Total Protein: 6.7 g/dL (ref 6.5–8.1)

## 2022-06-01 LAB — CBG MONITORING, ED: Glucose-Capillary: 174 mg/dL — ABNORMAL HIGH (ref 70–99)

## 2022-06-01 MED ORDER — AZITHROMYCIN 250 MG PO TABS: 250.0000 mg | ORAL_TABLET | Freq: Every day | ORAL | 0 refills | Status: DC

## 2022-06-01 MED ORDER — AMOXICILLIN-POT CLAVULANATE 875-125 MG PO TABS: 1.0000 | ORAL_TABLET | Freq: Two times a day (BID) | ORAL | 0 refills | Status: AC

## 2022-06-01 NOTE — ED Provider Notes (Signed)
[place] EMERGENCY DEPARTMENT AT [facility] Provider Note   CSN: [id number] Arrival date & time: 06/01/22  1019     History  No chief complaint on file.   Kim Mills is a 82 y.o. female.  HPI Patient presents for buttock wounds and chest congestion.  History is provided by patient, son, and daughter.  Patient has had wounds in the upper gluteal cleft over the past 2 weeks.  These wounds have been being treated with barrier cream.  Patient endorses some mild discomfort to this area.  Patient, herself, has no other complaints.  Patient and son confirm that she has been eating and drinking well.  Daughter endorse some concern about some recent chest congestion.  Patient has had increased shortness of breath with exertion.  She has had a recent cough.  Per chart review, patient's medical history includes arthritis, DM, GERD, HLD, HTN, CKD, and multiple myeloma.  She is followed by Dr. Kirtland Bouchard for her multiple myeloma.  She is currently on chemotherapy and Decadron.    Home Medications Prior to Admission medications   Medication Sig Start Date End Date Taking? Authorizing Provider  amoxicillin-clavulanate (AUGMENTIN) 875-125 MG tablet Take 1 tablet by mouth every 12 (twelve) hours for 5 days. 06/01/22 06/06/22 Yes Gloris Manchester, MD  azithromycin (ZITHROMAX) 250 MG tablet Take 1 tablet (250 mg total) by mouth daily. Take first 2 tablets together, then 1 every day until finished. 06/01/22  Yes Gloris Manchester, MD  ciprofloxacin (CIPRO) 500 MG tablet Take 1 tablet (500 mg total) by mouth 2 (two) times daily for 5 days. 05/27/22 06/01/22  Doreatha Massed, MD  albuterol (VENTOLIN HFA) 108 (90 Base) MCG/ACT inhaler Inhale 2 puffs into the lungs every 6 (six) hours as needed for wheezing or shortness of breath. 01/14/21   [name], MD  ASPIRIN 81 PO Take 1 tablet by mouth daily.    [provider]  blood glucose meter kit and supplies Dispense based on patient and insurance  preference. Use up to four times daily as directed. (FOR ICD-10 E10.9, E11.9). 10/23/20   [name], MD  Blood Glucose Monitoring Suppl (BLOOD GLUCOSE SYSTEM PAK) KIT Please dispense based on patient and insurance preference. Use as directed to monitor FSBS 2x daily. Dx: E11.9. 04/12/18   Salley Scarlet, MD  Calcium 500 MG tablet Take 600 mg by mouth 2 (two) times daily.    [provider]  dexamethasone (DECADRON) 4 MG tablet Take 5 tablets (20 mg total) by mouth once a week. 03/01/22   Doreatha Massed, MD  fluticasone (FLONASE) 50 MCG/ACT nasal spray Place 2 sprays into both nostrils daily. 05/27/21   [name], MD  gabapentin (NEURONTIN) 300 MG capsule TAKE ONE CAPSULE (  TOTAL) BY MOUTH TWO TIMES DAILY 01/25/22   [name], MD  glipiZIDE (GLUCOTROL) 5 MG tablet TAKE ONE TABLET (  TOTAL) BY MOUTH TWO TIMES DAILY BEFORE A MEAL 01/25/22   [name], MD  Glucose Blood (BLOOD GLUCOSE TEST STRIPS) STRP Please dispense based on patient and insurance preference. Use as directed to monitor FSBS 2x daily. Dx: E11.9. 04/12/18   Salley Scarlet, MD  HM LORATADINE 10 MG tablet TAKE ONE TABLET (  TOTAL) BY MOUTH DAILY Patient taking differently: Take 10 mg by mouth daily as needed for allergies. 10/05/19   [place], Velna Hatchet, MD  HYDROcodone-acetaminophen (NORCO/VICODIN) 5-325 MG tablet TAKE ONE (1) TABLET BY MOUTH EVERY 12 HOURS AS NEEDED FOR MODERATE PAIN  05/31/22   [name], MD  Lancets MISC Please dispense based on patient and insurance preference. Use as directed to monitor FSBS 2x daily. Dx: E11.9. 04/12/18   Salley Scarlet, MD  lenalidomide (REVLIMID) 10 MG capsule Take 1 capsule (10 mg total) by mouth daily. 14 days on, 7 days off 05/20/22   Doreatha Massed, MD  levothyroxine (SYNTHROID) 137 MCG tablet Take 1 tablet (137 mcg total) by mouth daily before breakfast. 03/29/22   [name], MD  losartan (COZAAR) 25 MG tablet Take 1 tablet (25 mg  total) by mouth daily. 03/29/22   [name], MD  magic mouthwash w/lidocaine SOLN Take 5 mLs by mouth 4 (four) times daily. 01/08/20   Doreatha Massed, MD  magnesium oxide (MAG-OX) 400 (240 Mg) MG tablet Take 1 tablet (400 mg total) by mouth 3 (three) times daily. 11/03/21   Doreatha Massed, MD  meclizine (ANTIVERT) 25 MG tablet Take 25 mg by mouth every 6 (six) hours as needed for dizziness.    [provider]  metFORMIN (GLUCOPHAGE) 1000 MG tablet Take 1 tablet (1,000 mg total) by mouth 2 (two) times daily. 03/29/22   [name], MD  metoprolol tartrate (LOPRESSOR) 25 MG tablet TAKE ONE-HALF TABLET (12.5MG  TOTAL) BY MOUTH TWO TIMES DAILY 07/14/21   [name], MD  omeprazole (PRILOSEC) 40 MG capsule TAKE ONE CAPSULE BY MOUTH DAILY 04/03/20   [place], Velna Hatchet, MD  polyethylene glycol (MIRALAX / GLYCOLAX) 17 g packet Take 17 g by mouth daily. 09/30/20   Shahmehdi, Gemma Payor, MD  potassium chloride (KLOR-CON M) 10 MEQ tablet Take 2 tablets (20 mEq total) by mouth daily. 04/13/22   [name], MD  potassium chloride (KLOR-CON) 10 MEQ tablet Take 20 mEq by mouth daily. 01/23/22   [provider]  simvastatin (ZOCOR) 40 MG tablet TAKE ONE TABLET (40MG  TOTAL) BY MOUTH BEDTIME 07/20/21   [name], MD  traMADol (ULTRAM) 50 MG tablet TAKE ONE TABLET (50MG  TOTAL) BY MOUTH EVERY SIX HOURS AS NEEDED 05/03/22   Doreatha Massed, MD  zinc oxide [facility] ZINC OXIDE) 20 % ointment Apply 1 application topically as needed for irritation. 09/29/20   Kendell Bane, MD      Allergies    Patient has no known allergies.    Review of Systems   Review of Systems  Respiratory:  Positive for cough and shortness of breath.   Skin:  Positive for wound.  All other systems reviewed and are negative.   Physical Exam Updated Vital Signs BP (!) 136/58 (BP Location: Right Arm)   Pulse (!) 59   Temp (!) 97.5 F (36.4 C) (Oral)   Resp (!) 21   SpO2 95%  Physical  Exam Vitals and nursing note reviewed. Exam conducted with a chaperone present.  Constitutional:      General: She is not in acute distress.    Appearance: Normal appearance. She is well-developed. She is not ill-appearing, toxic-appearing or diaphoretic.  HENT:     Head: Normocephalic and atraumatic.     Right Ear: External ear normal.     Left Ear: External ear normal.  Eyes:     Extraocular Movements: Extraocular movements intact.     Conjunctiva/sclera: Conjunctivae normal.  Cardiovascular:     Rate and Rhythm: Normal rate and regular rhythm.     Heart sounds: No murmur heard. Pulmonary:     Effort: Pulmonary effort is normal. No respiratory distress.  Breath sounds: Normal breath sounds. No stridor. No wheezing, rhonchi or rales.  Abdominal:     General: There is no distension.     Palpations: Abdomen is soft.     Tenderness: There is no abdominal tenderness.  Genitourinary:    Comments: Small wounds to bilateral aspects of upper gluteal cleft.  No areas of induration or fluctuance.  No drainage.  No surrounding erythema. Musculoskeletal:        General: No swelling. Normal range of motion.     Cervical back: Normal range of motion and neck supple.  Skin:    General: Skin is warm and dry.     Coloration: Skin is not jaundiced or pale.  Neurological:     General: No focal deficit present.     Mental Status: She is alert and oriented to person, place, and time.  Psychiatric:        Mood and Affect: Mood normal.        Behavior: Behavior normal.     ED Results / Procedures / Treatments   Labs (all labs ordered are listed, but only abnormal results are displayed) Labs Reviewed  CBC WITH DIFFERENTIAL/PLATELET - Abnormal; Notable for the following components:      Result Value   RBC 3.68 (*)    Hemoglobin 11.3 (*)    Abs Immature Granulocytes 0.17 (*)    All other components within normal limits  COMPREHENSIVE METABOLIC PANEL - Abnormal; Notable for the following  components:   Glucose, Bld 212 (*)    Creatinine, Ser 1.09 (*)    Albumin 3.1 (*)    GFR, Estimated 52 (*)    All other components within normal limits  CBG MONITORING, ED - Abnormal; Notable for the following components:   Glucose-Capillary 174 (*)    All other components within normal limits    EKG None  Radiology DG Chest Portable 1 View  Result Date: 06/01/2022 CLINICAL DATA:  Cough. EXAM: PORTABLE CHEST 1 VIEW COMPARISON:  09/26/2020. FINDINGS: Low lung volumes accentuate the pulmonary vasculature and cardiomediastinal silhouette. Bibasilar airspace opacities, which may reflect atelectasis, aspiration or infection. No pleural effusion or pneumothorax. IMPRESSION: Bibasilar airspace opacities, which may reflect atelectasis, aspiration or infection. Electronically Signed   By: Orvan Falconer M.D.   On: 06/01/2022 14:55    Procedures Procedures    Medications Ordered in ED Medications - No data to display  ED Course/ Medical Decision Making/ A&P                             Medical Decision Making Amount and/or Complexity of Data Reviewed Labs: ordered. Radiology: ordered.   Patient presenting from home, where she lives with her son and daughter, due to gluteal cleft wounds.  On exam, patient is overall well-appearing.  She endorses mild discomfort to area of wounds.  Wounds were inspected with chaperone present.  Findings not consistent with cellulitis or drainable fluid collection.  Patient and daughter were advised to continue keeping the area clean and applying zinc containing barrier cream.  Daughter also endorsing concern about recent chest congestion and cough.  Lab work is unremarkable.  X-ray shows bibasilar airspace opacities.  Given her recent symptoms, patient to be treated for pneumonia.  She currently has SpO2 of 95% on room air with unlabored breathing.  She is appropriate for discharge home and outpatient therapy.  She is currently on 20 mg of Decadron weekly.   Prescriptions for  antibiotics were sent to her pharmacy.  Patient was discharged in good condition.        Final Clinical Impression(s) / ED Diagnoses Final diagnoses:  Acute cough  Wound of buttock, unspecified laterality, initial encounter    Rx / DC Orders ED Discharge Orders          Ordered    azithromycin (ZITHROMAX) 250 MG tablet  Daily        06/01/22 1512    amoxicillin-clavulanate (AUGMENTIN) 875-125 MG tablet  Every 12 hours        06/01/22 1512              Gloris Manchester, MD 06/01/22 1514

## 2022-06-01 NOTE — ED Provider Triage Note (Signed)
Emergency Medicine Provider Triage Evaluation Note  Kim Mills , a 82 y.o. female with past medical history of coronary artery disease, type 2 diabetes, hypertension, multiple myeloma not currently in remission, was evaluated in triage.  Pt complains of 2 open wounds of her buttocks.  She is unsure how long they have been present.  She denies any pain, fever or chills.  Patient's son at bedside denies change in mental status, but notes that she does have some confusion.  Review of Systems  Positive: Wounds to the buttocks Negative: Fever, chills, nausea vomiting, abdominal pain, rectal pain  Physical Exam  BP (!) 136/58 (BP Location: Right Arm)   Pulse (!) 59   Temp (!) 97.5 F (36.4 C) (Oral)   Resp (!) 21   SpO2 95%  Gen:   Awake, no distress   Resp:  Normal effort  MSK:   Moves extremities without difficulty  Other:    Medical Decision Making  Medically screening exam initiated at 1:34 PM.  Appropriate orders placed.  Kim Mills was informed that the remainder of the evaluation will be completed by another provider, this initial triage assessment does not replace that evaluation, and the importance of remaining in the ED until their evaluation is complete.  Exam of buttocks deferred as patient evaluated in triage   Pauline Aus, PA-C 06/01/22 1339

## 2022-06-01 NOTE — ED Triage Notes (Signed)
Pt cancer pt with chronic bed sores that are getting worse per son. Denies fevers or change in mental status. Pt confused but son states that has been getting progressively worse. Pt is alert/oriented to some. Color wnl. Nad. Daughter is usual caretaker.

## 2022-06-01 NOTE — Discharge Instructions (Addendum)
Your chest x-ray showed a possible pneumonia.  Antibiotics were sent to your pharmacy.  Take as prescribed.  Continue to keep areas of buttock wounds clean and continue to apply barrier cream to promote healing and minimize discomfort.  Return to the emergency department for any new or worsening symptoms of concern.

## 2022-06-02 ENCOUNTER — Other Ambulatory Visit: Payer: Self-pay | Admitting: Hematology

## 2022-06-04 ENCOUNTER — Telehealth: Payer: Self-pay

## 2022-06-04 NOTE — Telephone Encounter (Signed)
        Patient  visited [facility] on 06/01/2022  for acute cough.   Telephone encounter attempt :  1st  A HIPAA compliant voice message was left requesting a return call.  Instructed patient to call back at [phone].   Doyle Kunath [facility]  [facility] Population Health Community Resource Care Guide   [email or web address]  ?? [phone]   Website: [email or web address]  [email or web address]

## 2022-06-06 NOTE — Progress Notes (Signed)
[facility] [address] [place] [address]    Clinic Day:  06/07/2022  Referring physician: [name], MD  Patient Care Team: [name], MD as PCP - General (Internal Medicine) Laqueta Linden, MD (Inactive) as PCP - Cardiology (Cardiology) Corbin Ade, MD as Consulting Physician (Gastroenterology) Doreatha Massed, MD as Medical Oncologist (Medical Oncology) Erroll Luna, [facility] as Pharmacist (Pharmacist)   ASSESSMENT & PLAN:   Assessment: 1.  Normocytic anemia: -Patient seen at the request of Dr. Jeanice Lim for further work-up and management of normocytic anemia. -Recent CBC on 08/21/2019 shows hemoglobin 6.9 with MCV of 88.3.  White count and platelets are normal.  No history of CKD.  Denies any bleeding per rectum or melena. Last colonoscopy on 04/01/2014 shows diffusely pigmented rectal mucosa consistent with melanosis [name]  Diffusely pigmented colonic mucosa, 2 diminutive polyps in the mid ascending segment, otherwise the remainder of the colon mucosa was normal.  Biopsy consistent with tubular adenoma. -CT renal study on 05/11/2019 shows normal-sized spleen with normal liver.  No other abnormal adenopathy. -Denies any prior history of blood transfusion.  She is currently taking iron tablet twice daily.   2.  IgG lambda plasma cell myeloma: -Skeletal survey on 09/12/2019 shows diffuse faint lucencies in the skull, bilateral femurs. -Bone marrow biopsy on 09/27/2019 shows 70% plasma cells in hypercellular marrow.  Chromosome analysis was 46, XX. FISH panel was normal.  However quality of specimen is compromised. -LDH normal, beta-2 microglobulin 2.9.  M spike was 2.1 g.  Kappa light chains 19.7, lambda light chains 14.3 with ratio of 1.38. -24-hour urine shows nonnephrotic range proteinuria.  Urine immunofixation was positive. - 9 cycles of RVD from 10/29/2019 through 05/02/2020. - Because of her age and  comorbidities, she was not considered a candidate for bone marrow transplant. - Maintenance Revlimid was held due to severe weakness. - Revlimid 10 mg 2 weeks on/1 week off restarted back on 06/03/2021 due to worsening M spike.    Plan: 1.  IgG lambda plasma cell myeloma: - Reviewed myeloma labs from 05/24/2022: M spike is stable at 0.4 g.  Free light chain ratio improved to 1.25 with kappa light chains of 31. - Continue Revlimid 10 mg 2 weeks on/1 week off and dexamethasone 20 mg weekly. - She had a UTI with E. coli and was treated with Cipro for 5 days on 05/26/2021.  She went to the ER on 06/01/2022 and was given Augmentin and Z-Pak which she finished yesterday.  She reported some diarrhea for the last few days but last 2 days stool has been thickening up. - I have recommended stool C. difficile and GI panel if she continues to have watery diarrhea.  Most likely antibiotic induced. - She also developed pressure sores in the buttock region.  We will make a referral to wound care nurse.  She was told to use guaifenesin for congestion.  She was also told to use donut pillow while sitting and try to be active as much as possible. - RTC 6 weeks with repeat myeloma panel.   2.  Hypomagnesemia: - Continue magnesium 3 times daily.   3.  Bilateral knee pains: - Continue tramadol as needed.   4.  Bone protection: - Latest calcium level is 9.1.  Continue denosumab monthly.  Continue calcium supplements at home.  5.  Hypokalemia: - Continue potassium supplements.  Potassium is normal.    Orders Placed This Encounter  Procedures   Protein electrophoresis, serum    Standing Status:   Future    Standing Expiration Date:   06/07/2023    Order Specific Question:   Release to patient    Answer:   Immediate   Kappa/lambda light chains    Standing Status:   Future    Standing Expiration Date:   06/07/2023      I,Katie Daubenspeck,acting as a scribe for Doreatha Massed, MD.,have documented all  relevant documentation on the behalf of Doreatha Massed, MD,as directed by  Doreatha Massed, MD while in the presence of Doreatha Massed, MD.   I, Doreatha Massed MD, have reviewed the above documentation for accuracy and completeness, and I agree with the above.   Doreatha Massed, MD   4/29/20244:20 PM  CHIEF COMPLAINT:   Diagnosis: multiple myeloma & normocytic anemia    Cancer Staging  No matching staging information was found for the patient.   Prior Therapy: 1. Velcade x 9 cycles from 10/29/2019 to 05/02/2020 2. Revlimid 2/3 weeks, held due to severe weakness   Current Therapy:  Revlimid and dexamethasone    HISTORY OF PRESENT ILLNESS:   Oncology History  Multiple myeloma not having achieved remission (HCC)  10/13/2019 Initial Diagnosis   Multiple myeloma not having achieved remission (HCC)   10/29/2019 - 05/02/2020 Chemotherapy   Patient is on Treatment Plan : MYELOMA NON-TRANSPLANT CANDIDATES VRd weekly q21d        INTERVAL HISTORY:   Kim Mills is a 82 y.o. female presenting to clinic today for follow up of multiple myeloma & normocytic anemia. She was last seen by me on 04/26/22.  Of note since her last visit, she was seen in the ED on 06/01/22 for gluteal cleft wounds and cough/chest congestion. She was discharged on antibiotics with amoxicillin and azithromycin.  Today, she states that she is doing well overall. Her appetite level is at 60%. Her energy level is at 25%.  PAST MEDICAL HISTORY:   Past Medical History: Past Medical History:  Diagnosis Date   Arthritis    Cancer (HCC)    Cataract    Chest pain    Associated with weakness and fatigue   Diabetes mellitus    A1c of 7.4 in 08/2010   GERD (gastroesophageal reflux disease)    Glaucoma    Hyperlipidemia    Lipid profile in 08/2010:190, 121, 48, 118; normal CBC and CMet   Hypertension    Lipid profile in 08/2010:190, 121, 48, 118; normal CBC and CMet   Hypothyroidism    Hypothyroidism     Obesity     Surgical History: Past Surgical History:  Procedure Laterality Date   CHOLECYSTECTOMY     COLONOSCOPY N/A 04/01/2014   RMR: Melanosis coli. colonic polyps removed as described above.    CYSTOSCOPY W/ URETERAL STENT PLACEMENT Right 09/13/2019   Procedure: CYSTOSCOPY WITH RIGHT  RETROGRADE PYELOGRAM; BLADDER BIOPSY;  Surgeon: Malen Gauze, MD;  Location: [facility];  Service: Urology;  Laterality: Right;   LEFT HEART CATH AND CORONARY ANGIOGRAPHY N/A 10/07/2017   Procedure: LEFT HEART CATH AND CORONARY ANGIOGRAPHY;  Surgeon: Lennette Bihari, MD;  Location: [facility];  Service: Cardiovascular;  Laterality: N/A;   THYROIDECTOMY, PARTIAL      Social History: Social History   Socioeconomic History   Marital status: Widowed    Spouse name: Not on file   Number of children: 7   Years of education: Not on file   Highest education level: Not on  file  Occupational History    Employer: RETIRED  Tobacco Use   Smoking status: Never    Passive exposure: Yes   Smokeless tobacco: Never  Vaping Use   Vaping Use: Never used  Substance and Sexual Activity   Alcohol use: Never   Drug use: Never   Sexual activity: Not Currently  Other Topics Concern   Not on file  Social History Narrative   Daughter, Denzil Magnuson, lives with and assists patient.    Social Determinants of Health   Financial Resource Strain: Low Risk  (11/05/2020)   Overall Financial Resource Strain (CARDIA)    Difficulty of Paying Living Expenses: Not hard at all  Food Insecurity: No Food Insecurity (11/05/2020)   Hunger Vital Sign    Worried About Running Out of Food in the Last Year: Never true    Ran Out of Food in the Last Year: Never true  Transportation Needs: No Transportation Needs (11/05/2020)   PRAPARE - Administrator, Civil Service (Medical): No    Lack of Transportation (Non-Medical): No  Physical Activity: Sufficiently Active (11/05/2020)   Exercise Vital Sign    Days of Exercise  per Week: 5 days    Minutes of Exercise per Session: 30 min  Stress: No Stress Concern Present (11/05/2020)   [employer] of Occupational Health - Occupational Stress Questionnaire    Feeling of Stress : Not at all  Social Connections: Moderately Integrated (11/05/2020)   Social Connection and Isolation Panel [NHANES]    Frequency of Communication with Friends and Family: More than three times a week    Frequency of Social Gatherings with Friends and Family: More than three times a week    Attends Religious Services: 1 to 4 times per year    Active Member of [employer] or Organizations: Yes    Attends Banker Meetings: 1 to 4 times per year    Marital Status: Widowed  Intimate Partner Violence: Not At Risk (11/05/2020)   Humiliation, Afraid, Rape, and Kick questionnaire    Fear of Current or Ex-Partner: No    Emotionally Abused: No    Physically Abused: No    Sexually Abused: No    Family History: Family History  Problem Relation Age of Onset   Anemia Father    Arthritis Mother    Stroke Brother    Cancer Sister        unknown kind   Diabetes Sister    Dementia Sister    Cancer Niece        Breast   Cancer Brother    Healthy Son    Healthy Son    Healthy Son    Healthy Daughter    Healthy Daughter    Healthy Daughter    Sarcoidosis Daughter     Current Medications:  Current Outpatient Medications:    albuterol (VENTOLIN HFA) 108 (90 Base) MCG/ACT inhaler, Inhale 2 puffs into the lungs every 6 (six) hours as needed for wheezing or shortness of breath., Disp: 8 g, Rfl: 0   ASPIRIN 81 PO, Take 1 tablet by mouth daily., Disp: , Rfl:    azithromycin (ZITHROMAX) 250 MG tablet, Take 1 tablet (250 mg total) by mouth daily. Take first 2 tablets together, then 1 every day until finished., Disp: 6 tablet, Rfl: 0   blood glucose meter kit and supplies, Dispense based on patient and insurance preference. Use up to four times daily as directed. (FOR ICD-10 E10.9,  E11.9)., Disp: 1 each, Rfl:  0   Blood Glucose Monitoring Suppl (BLOOD GLUCOSE SYSTEM PAK) KIT, Please dispense based on patient and insurance preference. Use as directed to monitor FSBS 2x daily. Dx: E11.9., Disp: 1 each, Rfl: 1   Calcium 500 MG tablet, Take 600 mg by mouth 2 (two) times daily., Disp: , Rfl:    dexamethasone (DECADRON) 4 MG tablet, Take 5 tablets (20 mg total) by mouth once a week., Disp: 20 tablet, Rfl: 5   fluticasone (FLONASE) 50 MCG/ACT nasal spray, Place 2 sprays into both nostrils daily., Disp: 16 g, Rfl: 6   gabapentin (NEURONTIN) 300 MG capsule, TAKE ONE CAPSULE (300MG  TOTAL) BY MOUTH TWO TIMES DAILY, Disp: 90 capsule, Rfl: 1   glipiZIDE (GLUCOTROL) 5 MG tablet, TAKE ONE TABLET (5MG  TOTAL) BY MOUTH TWO TIMES DAILY BEFORE A MEAL, Disp: 90 tablet, Rfl: 1   Glucose Blood (BLOOD GLUCOSE TEST STRIPS) STRP, Please dispense based on patient and insurance preference. Use as directed to monitor FSBS 2x daily. Dx: E11.9., Disp: 100 each, Rfl: 11   HM LORATADINE 10 MG tablet, TAKE ONE TABLET (10MG  TOTAL) BY MOUTH DAILY (Patient taking differently: Take 10 mg by mouth daily as needed for allergies.), Disp: 30 tablet, Rfl: 0   HYDROcodone-acetaminophen (NORCO/VICODIN) 5-325 MG tablet, TAKE ONE (1) TABLET BY MOUTH EVERY 12 HOURS AS NEEDED FOR MODERATE PAIN, Disp: 60 tablet, Rfl: 0   Lancets MISC, Please dispense based on patient and insurance preference. Use as directed to monitor FSBS 2x daily. Dx: E11.9., Disp: 100 each, Rfl: 11   lenalidomide (REVLIMID) 10 MG capsule, Take 1 capsule (10 mg total) by mouth daily. 14 days on, 7 days off, Disp: 14 capsule, Rfl: 0   levothyroxine (SYNTHROID) 137 MCG tablet, Take 1 tablet (137 mcg total) by mouth daily before breakfast., Disp: 90 tablet, Rfl: 1   losartan (COZAAR) 25 MG tablet, Take 1 tablet (25 mg total) by mouth daily., Disp: 90 tablet, Rfl: 1   magic mouthwash w/lidocaine SOLN, Take 5 mLs by mouth 4 (four) times daily., Disp: 450 mL, Rfl:  0   magnesium oxide (MAG-OX) 400 (240 Mg) MG tablet, Take 1 tablet (400 mg total) by mouth 3 (three) times daily., Disp: 90 tablet, Rfl: 6   meclizine (ANTIVERT) 25 MG tablet, Take 25 mg by mouth every 6 (six) hours as needed for dizziness., Disp: , Rfl:    metFORMIN (GLUCOPHAGE) 1000 MG tablet, Take 1 tablet (1,000 mg total) by mouth 2 (two) times daily., Disp: 60 tablet, Rfl: 3   metoprolol tartrate (LOPRESSOR) 25 MG tablet, TAKE ONE-HALF TABLET (12.5MG  TOTAL) BY MOUTH TWO TIMES DAILY, Disp: 90 tablet, Rfl: 3   omeprazole (PRILOSEC) 40 MG capsule, TAKE ONE CAPSULE BY MOUTH DAILY, Disp: 90 capsule, Rfl: 3   polyethylene glycol (MIRALAX / GLYCOLAX) 17 g packet, Take 17 g by mouth daily., Disp: 14 each, Rfl: 0   potassium chloride (KLOR-CON M) 10 MEQ tablet, Take 2 tablets (20 mEq total) by mouth daily., Disp: 60 tablet, Rfl: 2   potassium chloride (KLOR-CON) 10 MEQ tablet, Take 20 mEq by mouth daily., Disp: , Rfl:    simvastatin (ZOCOR) 40 MG tablet, TAKE ONE TABLET (40MG  TOTAL) BY MOUTH BEDTIME, Disp: 90 tablet, Rfl: 3   traMADol (ULTRAM) 50 MG tablet, TAKE ONE TABLET (50MG  TOTAL) BY MOUTH EVERY SIX HOURS AS NEEDED, Disp: 120 tablet, Rfl: 0   zinc oxide (MEIJER ZINC OXIDE) 20 % ointment, Apply 1 application topically as needed for irritation., Disp: 56.7 g, Rfl: 0  Allergies: No Known Allergies  REVIEW OF SYSTEMS:   Review of Systems  Constitutional:  Negative for chills, fatigue and fever.  HENT:   Negative for lump/mass, mouth sores, nosebleeds, sore throat and trouble swallowing.   Eyes:  Negative for eye problems.  Respiratory:  Positive for cough. Negative for shortness of breath.   Cardiovascular:  Positive for palpitations. Negative for chest pain and leg swelling.  Gastrointestinal:  Positive for diarrhea. Negative for abdominal pain, constipation, nausea and vomiting.  Genitourinary:  Negative for bladder incontinence, difficulty urinating, dysuria, frequency, hematuria and  nocturia.   Musculoskeletal:  Positive for arthralgias. Negative for back pain, flank pain, myalgias and neck pain.  Skin:  Negative for itching and rash.  Neurological:  Positive for dizziness and numbness. Negative for headaches.  Hematological:  Does not bruise/bleed easily.  Psychiatric/Behavioral:  Negative for depression, sleep disturbance and suicidal ideas. The patient is not nervous/anxious.   All other systems reviewed and are negative.    VITALS:   Blood pressure (!) 135/52, pulse (!) 59, temperature 99.6 F (37.6 C), temperature source Tympanic, resp. rate 16, SpO2 100 %.  Wt Readings from Last 3 Encounters:  04/22/22 175 lb (79.4 kg)  03/01/22 166 lb (75.3 kg)  11/03/21 155 lb (70.3 kg)    There is no height or weight on file to calculate BMI.  Performance status (ECOG): 1 - Symptomatic but completely ambulatory  PHYSICAL EXAM:   Physical Exam Vitals and nursing note reviewed. Exam conducted with a chaperone present.  Constitutional:      Appearance: Normal appearance.  Cardiovascular:     Rate and Rhythm: Normal rate and regular rhythm.     Pulses: Normal pulses.     Heart sounds: Normal heart sounds.  Pulmonary:     Effort: Pulmonary effort is normal.     Breath sounds: Normal breath sounds.  Abdominal:     Palpations: Abdomen is soft. There is no hepatomegaly, splenomegaly or mass.     Tenderness: There is no abdominal tenderness.  Musculoskeletal:     Right lower leg: No edema.     Left lower leg: No edema.  Lymphadenopathy:     Cervical: No cervical adenopathy.     Right cervical: No superficial, deep or posterior cervical adenopathy.    Left cervical: No superficial, deep or posterior cervical adenopathy.     Upper Body:     Right upper body: No supraclavicular or axillary adenopathy.     Left upper body: No supraclavicular or axillary adenopathy.  Neurological:     General: No focal deficit present.     Mental Status: She is alert and oriented to  person, place, and time.  Psychiatric:        Mood and Affect: Mood normal.        Behavior: Behavior normal.     LABS:      Latest Ref Rng & Units 06/01/2022   11:09 AM 05/24/2022    8:28 AM 04/26/2022   12:53 PM  CBC  WBC 4.0 - 10.5 K/uL 6.9  6.1  7.2   Hemoglobin 12.0 - 15.0 g/dL 38.7  56.4  33.2   Hematocrit 36.0 - 46.0 % 36.4  38.6  38.4   Platelets 150 - 400 K/uL 200  186  149       Latest Ref Rng & Units 06/01/2022   11:09 AM 05/24/2022    8:28 AM 04/26/2022   12:53 PM  CMP  Glucose 70 - 99 mg/dL  212  50  140   BUN 8 - 23 mg/dL 20  14  17    Creatinine 0.44 - 1.00 mg/dL 1.61  0.96  0.45   Sodium 135 - 145 mmol/L 135  136  136   Potassium 3.5 - 5.1 mmol/L 4.6  4.2  4.5   Chloride 98 - 111 mmol/L 100  110  104   CO2 22 - 32 mmol/L 25  20  24    Calcium 8.9 - 10.3 mg/dL 9.1  8.2  8.3   Total Protein 6.5 - 8.1 g/dL 6.7  7.1  6.9   Total Bilirubin 0.3 - 1.2 mg/dL 0.8  0.4  1.2   Alkaline Phos 38 - 126 U/L 61  62  50   AST 15 - 41 U/L 22  21  15    ALT 0 - 44 U/L 19  19  11       No results found for: "CEA1", "CEA" / No results found for: "CEA1", "CEA" No results found for: "PSA1" No results found for: "CAN199" No results found for: "CAN125"  Lab Results  Component Value Date   TOTALPROTELP 6.4 05/24/2022   ALBUMINELP 3.4 05/24/2022   A1GS 0.3 05/24/2022   A2GS 0.8 05/24/2022   BETS 1.2 05/24/2022   GAMS 0.8 05/24/2022   MSPIKE 0.4 (H) 05/24/2022   SPEI Comment 05/24/2022   Lab Results  Component Value Date   TIBC 297 11/12/2019   TIBC 275 09/04/2019   TIBC 269 08/21/2019   FERRITIN 256 11/12/2019   FERRITIN 128 09/04/2019   FERRITIN 131 08/21/2019   IRONPCTSAT 23 11/12/2019   IRONPCTSAT 24 09/04/2019   IRONPCTSAT 25 08/21/2019   Lab Results  Component Value Date   LDH 157 12/22/2021   LDH 158 10/28/2021   LDH 166 09/02/2021     STUDIES:   DG Chest Portable 1 View  Result Date: 06/01/2022 CLINICAL DATA:  Cough. EXAM: PORTABLE CHEST 1 VIEW  COMPARISON:  09/26/2020. FINDINGS: Low lung volumes accentuate the pulmonary vasculature and cardiomediastinal silhouette. Bibasilar airspace opacities, which may reflect atelectasis, aspiration or infection. No pleural effusion or pneumothorax. IMPRESSION: Bibasilar airspace opacities, which may reflect atelectasis, aspiration or infection. Electronically Signed   By: Orvan Falconer M.D.   On: 06/01/2022 14:55

## 2022-06-07 ENCOUNTER — Inpatient Hospital Stay (HOSPITAL_BASED_OUTPATIENT_CLINIC_OR_DEPARTMENT_OTHER): Payer: Medicare Other | Admitting: Hematology

## 2022-06-07 VITALS — BP 135/52 | HR 59 | Temp 99.6°F | Resp 16

## 2022-06-07 DIAGNOSIS — C9000 Multiple myeloma not having achieved remission: Secondary | ICD-10-CM

## 2022-06-07 DIAGNOSIS — Z79899 Other long term (current) drug therapy: Secondary | ICD-10-CM | POA: Diagnosis not present

## 2022-06-07 NOTE — Patient Instructions (Signed)
[facility]  Discharge Instructions  You were seen and examined today by Dr. Ellin Saba.  Dr. Ellin Saba discussed your most recent lab work which revealed that all of the myeloma labs look good.  Continue the Revlimid as prescribed.   Dr. Ellin Saba is sending referral to wound care nurse. Try to move as much as possible to take pressure off of the bed sores.   Get a donut pillow to help relieve pressure on the bed sores.  Take a probiotic and eat some yogurt to help get the good bacteria back in your gut.  Follow-up as scheduled in 6 weeks.    Thank you for choosing [facility] to provide your oncology and hematology care.   To afford each patient quality time with our provider, please arrive at least 15 minutes before your scheduled appointment time. You may need to reschedule your appointment if you arrive late (10 or more minutes). Arriving late affects you and other patients whose appointments are after yours.  Also, if you miss three or more appointments without notifying the office, you may be dismissed from the clinic at the provider's discretion.    Again, thank you for choosing [facility].  Our hope is that these requests will decrease the amount of time that you wait before being seen by our physicians.   If you have a lab appointment with the Cancer Center - please note that after April 8th, all labs will be drawn in the cancer center.  You do not have to check in or register with the main entrance as you have in the past but will complete your check-in at the cancer center.            _____________________________________________________________  Should you have questions after your visit to [facility], please contact our office at [phone] and follow the prompts.  Our office hours are 8:00 a.m. to 4:30 p.m. Monday - Thursday and 8:00 a.m. to 2:30 p.m. Friday.  Please note that  voicemails left after 4:00 p.m. may not be returned until the following business day.  We are closed weekends and all major holidays.  You do have access to a nurse 24-7, just call the main number to the clinic [phone] and do not press any options, hold on the line and a nurse will answer the phone.    For prescription refill requests, have your pharmacy contact our office and allow 72 hours.    Masks are no longer required in the cancer centers. If you would like for your care team to wear a mask while they are taking care of you, please let them know. You may have one support person who is at least 80 years old accompany you for your appointments.

## 2022-06-08 ENCOUNTER — Encounter: Payer: Self-pay | Admitting: *Deleted

## 2022-06-08 ENCOUNTER — Telehealth: Payer: Self-pay

## 2022-06-08 ENCOUNTER — Other Ambulatory Visit: Payer: Self-pay | Admitting: *Deleted

## 2022-06-08 DIAGNOSIS — R5381 Other malaise: Secondary | ICD-10-CM

## 2022-06-08 DIAGNOSIS — E1140 Type 2 diabetes mellitus with diabetic neuropathy, unspecified: Secondary | ICD-10-CM

## 2022-06-08 DIAGNOSIS — R531 Weakness: Secondary | ICD-10-CM

## 2022-06-08 DIAGNOSIS — L89309 Pressure ulcer of unspecified buttock, unspecified stage: Secondary | ICD-10-CM

## 2022-06-08 NOTE — Progress Notes (Signed)
Referral placed for wound care and has been accepted by [facility].  Sister Iris made aware.

## 2022-06-08 NOTE — Telephone Encounter (Signed)
Transition Care Management Unsuccessful Follow-up Telephone Call  Date of discharge and from where:  06/01/2022 [facility].  Attempts:  2nd Attempt  Reason for unsuccessful TCM follow-up call:  Left voice message     Kim Mills Deaver  [facility] Community Resource Care Guide   [email or web address]  ?? [phone]   Website: [email or web address]  [email or web address]   

## 2022-06-09 DIAGNOSIS — Z792 Long term (current) use of antibiotics: Secondary | ICD-10-CM | POA: Diagnosis not present

## 2022-06-09 DIAGNOSIS — M25562 Pain in left knee: Secondary | ICD-10-CM | POA: Diagnosis not present

## 2022-06-09 DIAGNOSIS — D630 Anemia in neoplastic disease: Secondary | ICD-10-CM | POA: Diagnosis not present

## 2022-06-09 DIAGNOSIS — E1136 Type 2 diabetes mellitus with diabetic cataract: Secondary | ICD-10-CM | POA: Diagnosis not present

## 2022-06-09 DIAGNOSIS — Z7984 Long term (current) use of oral hypoglycemic drugs: Secondary | ICD-10-CM | POA: Diagnosis not present

## 2022-06-09 DIAGNOSIS — E669 Obesity, unspecified: Secondary | ICD-10-CM | POA: Diagnosis not present

## 2022-06-09 DIAGNOSIS — K219 Gastro-esophageal reflux disease without esophagitis: Secondary | ICD-10-CM | POA: Diagnosis not present

## 2022-06-09 DIAGNOSIS — Z7952 Long term (current) use of systemic steroids: Secondary | ICD-10-CM | POA: Diagnosis not present

## 2022-06-09 DIAGNOSIS — C9000 Multiple myeloma not having achieved remission: Secondary | ICD-10-CM | POA: Diagnosis not present

## 2022-06-09 DIAGNOSIS — L89312 Pressure ulcer of right buttock, stage 2: Secondary | ICD-10-CM | POA: Diagnosis not present

## 2022-06-09 DIAGNOSIS — L89322 Pressure ulcer of left buttock, stage 2: Secondary | ICD-10-CM | POA: Diagnosis not present

## 2022-06-09 DIAGNOSIS — M1990 Unspecified osteoarthritis, unspecified site: Secondary | ICD-10-CM | POA: Diagnosis not present

## 2022-06-09 DIAGNOSIS — E876 Hypokalemia: Secondary | ICD-10-CM | POA: Diagnosis not present

## 2022-06-09 DIAGNOSIS — Z6834 Body mass index (BMI) 34.0-34.9, adult: Secondary | ICD-10-CM | POA: Diagnosis not present

## 2022-06-09 DIAGNOSIS — E785 Hyperlipidemia, unspecified: Secondary | ICD-10-CM | POA: Diagnosis not present

## 2022-06-09 DIAGNOSIS — I10 Essential (primary) hypertension: Secondary | ICD-10-CM | POA: Diagnosis not present

## 2022-06-09 DIAGNOSIS — M25561 Pain in right knee: Secondary | ICD-10-CM | POA: Diagnosis not present

## 2022-06-09 DIAGNOSIS — E039 Hypothyroidism, unspecified: Secondary | ICD-10-CM | POA: Diagnosis not present

## 2022-06-09 DIAGNOSIS — H409 Unspecified glaucoma: Secondary | ICD-10-CM | POA: Diagnosis not present

## 2022-06-14 ENCOUNTER — Other Ambulatory Visit: Payer: Self-pay

## 2022-06-14 ENCOUNTER — Other Ambulatory Visit (HOSPITAL_COMMUNITY)
Admission: RE | Admit: 2022-06-14 | Discharge: 2022-06-14 | Disposition: A | Discharge status: Home / Self Care | Payer: Medicare Other | Source: Ambulatory Visit | Attending: Hematology | Admitting: Hematology

## 2022-06-14 ENCOUNTER — Other Ambulatory Visit: Payer: Self-pay | Admitting: Hematology

## 2022-06-14 ENCOUNTER — Other Ambulatory Visit: Payer: Self-pay | Admitting: Internal Medicine

## 2022-06-14 DIAGNOSIS — C9000 Multiple myeloma not having achieved remission: Secondary | ICD-10-CM | POA: Insufficient documentation

## 2022-06-14 DIAGNOSIS — E1140 Type 2 diabetes mellitus with diabetic neuropathy, unspecified: Secondary | ICD-10-CM

## 2022-06-14 DIAGNOSIS — D84821 Immunodeficiency due to drugs: Secondary | ICD-10-CM | POA: Diagnosis not present

## 2022-06-14 DIAGNOSIS — R197 Diarrhea, unspecified: Secondary | ICD-10-CM

## 2022-06-14 LAB — C DIFFICILE QUICK SCREEN W PCR REFLEX
C Diff antigen: NEGATIVE
C Diff interpretation: NOT DETECTED
C Diff toxin: NEGATIVE

## 2022-06-14 MED ORDER — LENALIDOMIDE 10 MG PO CAPS: 10.0000 mg | ORAL_CAPSULE | Freq: Every day | ORAL | 0 refills | Status: DC

## 2022-06-14 NOTE — Telephone Encounter (Signed)
Chart reviewed. Revlimid refilled per last office note with Dr. Katragadda.  

## 2022-06-14 NOTE — Progress Notes (Signed)
Orders placed for GI panel per last MD note.  Patient has returned her stool sample.  MD aware that he will have to place the C-diff order.

## 2022-06-15 DIAGNOSIS — E1136 Type 2 diabetes mellitus with diabetic cataract: Secondary | ICD-10-CM | POA: Diagnosis not present

## 2022-06-15 DIAGNOSIS — I10 Essential (primary) hypertension: Secondary | ICD-10-CM | POA: Diagnosis not present

## 2022-06-15 DIAGNOSIS — L89312 Pressure ulcer of right buttock, stage 2: Secondary | ICD-10-CM | POA: Diagnosis not present

## 2022-06-15 DIAGNOSIS — C9000 Multiple myeloma not having achieved remission: Secondary | ICD-10-CM | POA: Diagnosis not present

## 2022-06-15 DIAGNOSIS — D630 Anemia in neoplastic disease: Secondary | ICD-10-CM | POA: Diagnosis not present

## 2022-06-15 DIAGNOSIS — L89322 Pressure ulcer of left buttock, stage 2: Secondary | ICD-10-CM | POA: Diagnosis not present

## 2022-06-15 LAB — GI PATHOGEN PANEL BY PCR, STOOL

## 2022-06-16 DIAGNOSIS — L89312 Pressure ulcer of right buttock, stage 2: Secondary | ICD-10-CM | POA: Diagnosis not present

## 2022-06-16 DIAGNOSIS — I10 Essential (primary) hypertension: Secondary | ICD-10-CM | POA: Diagnosis not present

## 2022-06-16 DIAGNOSIS — L89322 Pressure ulcer of left buttock, stage 2: Secondary | ICD-10-CM | POA: Diagnosis not present

## 2022-06-16 DIAGNOSIS — E1136 Type 2 diabetes mellitus with diabetic cataract: Secondary | ICD-10-CM | POA: Diagnosis not present

## 2022-06-16 DIAGNOSIS — D630 Anemia in neoplastic disease: Secondary | ICD-10-CM | POA: Diagnosis not present

## 2022-06-16 DIAGNOSIS — C9000 Multiple myeloma not having achieved remission: Secondary | ICD-10-CM | POA: Diagnosis not present

## 2022-06-21 ENCOUNTER — Inpatient Hospital Stay: Payer: Medicare Other | Attending: Hematology

## 2022-06-21 ENCOUNTER — Inpatient Hospital Stay: Payer: Medicare Other

## 2022-06-21 ENCOUNTER — Other Ambulatory Visit: Payer: Self-pay | Admitting: *Deleted

## 2022-06-21 ENCOUNTER — Other Ambulatory Visit: Payer: Self-pay

## 2022-06-21 VITALS — BP 140/53 | HR 63 | Temp 97.2°F | Resp 18

## 2022-06-21 DIAGNOSIS — E876 Hypokalemia: Secondary | ICD-10-CM

## 2022-06-21 DIAGNOSIS — C9000 Multiple myeloma not having achieved remission: Secondary | ICD-10-CM

## 2022-06-21 LAB — COMPREHENSIVE METABOLIC PANEL
ALT: 17 U/L (ref 0–44)
AST: 18 U/L (ref 15–41)
Albumin: 3.1 g/dL — ABNORMAL LOW (ref 3.5–5.0)
Alkaline Phosphatase: 88 U/L (ref 38–126)
Anion gap: 10 (ref 5–15)
BUN: 13 mg/dL (ref 8–23)
CO2: 22 mmol/L (ref 22–32)
Calcium: 8.5 mg/dL — ABNORMAL LOW (ref 8.9–10.3)
Chloride: 104 mmol/L (ref 98–111)
Creatinine, Ser: 0.81 mg/dL (ref 0.44–1.00)
GFR, Estimated: >60 mL/min (ref >60)
Glucose, Bld: 129 mg/dL — ABNORMAL HIGH (ref 70–99)
Potassium: 3.9 mmol/L (ref 3.5–5.1)
Sodium: 136 mmol/L (ref 135–145)
Total Bilirubin: 0.9 mg/dL (ref 0.3–1.2)
Total Protein: 6.9 g/dL (ref 6.5–8.1)

## 2022-06-21 MED ORDER — DENOSUMAB 120 MG/1.7ML ~~LOC~~ SOLN
120.0000 mg | Freq: Once | SUBCUTANEOUS | Status: AC
  Administered 2022-06-21: 120 mg via SUBCUTANEOUS
  Filled 2022-06-21: qty 1.7

## 2022-06-21 NOTE — Patient Instructions (Signed)
[facility]  Discharge Instructions: Thank you for choosing [facility] to provide your oncology and hematology care.  If you have a lab appointment with the Cancer Center - please note that after April 8th, 2024, all labs will be drawn in the cancer center.  You do not have to check in or register with the main entrance as you have in the past but will complete your check-in in the cancer center.  Wear comfortable clothing and clothing appropriate for easy access to any Portacath or PICC line.   We strive to give you quality time with your provider. You may need to reschedule your appointment if you arrive late (15 or more minutes).  Arriving late affects you and other patients whose appointments are after yours.  Also, if you miss three or more appointments without notifying the office, you may be dismissed from the clinic at the provider's discretion.      For prescription refill requests, have your pharmacy contact our office and allow 72 hours for refills to be completed.    Today you received the following Xgeva, return as scheduled.   To help prevent nausea and vomiting after your treatment, we encourage you to take your nausea medication as directed.  BELOW ARE SYMPTOMS THAT SHOULD BE REPORTED IMMEDIATELY: *FEVER GREATER THAN 100.4 F (38 C) OR HIGHER *CHILLS OR SWEATING *NAUSEA AND VOMITING THAT IS NOT CONTROLLED WITH YOUR NAUSEA MEDICATION *UNUSUAL SHORTNESS OF BREATH *UNUSUAL BRUISING OR BLEEDING *URINARY PROBLEMS (pain or burning when urinating, or frequent urination) *BOWEL PROBLEMS (unusual diarrhea, constipation, pain near the anus) TENDERNESS IN MOUTH AND THROAT WITH OR WITHOUT PRESENCE OF ULCERS (sore throat, sores in mouth, or a toothache) UNUSUAL RASH, SWELLING OR PAIN  UNUSUAL VAGINAL DISCHARGE OR ITCHING   Items with * indicate a potential emergency and should be followed up as soon as possible or go to the Emergency Department if any  problems should occur.  Please show the CHEMOTHERAPY ALERT CARD or IMMUNOTHERAPY ALERT CARD at check-in to the Emergency Department and triage nurse.  Should you have questions after your visit or need to cancel or reschedule your appointment, please contact [facility] [phone]  and follow the prompts.  Office hours are 8:00 a.m. to 4:30 p.m. Monday - Friday. Please note that voicemails left after 4:00 p.m. may not be returned until the following business day.  We are closed weekends and major holidays. You have access to a nurse at all times for urgent questions. Please call the main number to the clinic [phone] and follow the prompts.  For any non-urgent questions, you may also contact your provider using MyChart. We now offer e-Visits for anyone 18 and older to request care online for non-urgent symptoms. For details visit [email or web address].   Also download the MyChart app! Go to the app store, search "MyChart", open the app, select [facility], and log in with your MyChart username and password.   

## 2022-06-21 NOTE — Progress Notes (Signed)
Patient taking calcium as directed. Denied tooth, jaw, and leg pain. No recent or upcoming dental visits. Labs reviewed. Patient tolerated injection with no complaints voiced. See MAR for details. Patient stable during and after injection. Site clean and dry with no bruising or swelling noted. Band aid applied. Vss with discharge and left in satisfactory condition with no s/s of distress.   

## 2022-06-22 DIAGNOSIS — L89312 Pressure ulcer of right buttock, stage 2: Secondary | ICD-10-CM | POA: Diagnosis not present

## 2022-06-22 DIAGNOSIS — I10 Essential (primary) hypertension: Secondary | ICD-10-CM | POA: Diagnosis not present

## 2022-06-22 DIAGNOSIS — L89322 Pressure ulcer of left buttock, stage 2: Secondary | ICD-10-CM | POA: Diagnosis not present

## 2022-06-22 DIAGNOSIS — D630 Anemia in neoplastic disease: Secondary | ICD-10-CM | POA: Diagnosis not present

## 2022-06-22 DIAGNOSIS — C9000 Multiple myeloma not having achieved remission: Secondary | ICD-10-CM | POA: Diagnosis not present

## 2022-06-22 DIAGNOSIS — E1136 Type 2 diabetes mellitus with diabetic cataract: Secondary | ICD-10-CM | POA: Diagnosis not present

## 2022-06-23 DIAGNOSIS — D630 Anemia in neoplastic disease: Secondary | ICD-10-CM | POA: Diagnosis not present

## 2022-06-23 DIAGNOSIS — L89322 Pressure ulcer of left buttock, stage 2: Secondary | ICD-10-CM | POA: Diagnosis not present

## 2022-06-23 DIAGNOSIS — I10 Essential (primary) hypertension: Secondary | ICD-10-CM | POA: Diagnosis not present

## 2022-06-23 DIAGNOSIS — C9000 Multiple myeloma not having achieved remission: Secondary | ICD-10-CM | POA: Diagnosis not present

## 2022-06-23 DIAGNOSIS — L89312 Pressure ulcer of right buttock, stage 2: Secondary | ICD-10-CM | POA: Diagnosis not present

## 2022-06-23 DIAGNOSIS — E1136 Type 2 diabetes mellitus with diabetic cataract: Secondary | ICD-10-CM | POA: Diagnosis not present

## 2022-06-25 DIAGNOSIS — E1136 Type 2 diabetes mellitus with diabetic cataract: Secondary | ICD-10-CM | POA: Diagnosis not present

## 2022-06-25 DIAGNOSIS — C9000 Multiple myeloma not having achieved remission: Secondary | ICD-10-CM | POA: Diagnosis not present

## 2022-06-25 DIAGNOSIS — L89312 Pressure ulcer of right buttock, stage 2: Secondary | ICD-10-CM | POA: Diagnosis not present

## 2022-06-25 DIAGNOSIS — L89322 Pressure ulcer of left buttock, stage 2: Secondary | ICD-10-CM | POA: Diagnosis not present

## 2022-06-25 DIAGNOSIS — D630 Anemia in neoplastic disease: Secondary | ICD-10-CM | POA: Diagnosis not present

## 2022-06-25 DIAGNOSIS — I10 Essential (primary) hypertension: Secondary | ICD-10-CM | POA: Diagnosis not present

## 2022-06-27 IMAGING — CT CT ABD-PEL WO/W CM
3 of 12 series · 11 of 46 positions shown, 17 images · IV contrast (Omnipaque or Isovue)
Comparison: 05/11/2019

CLINICAL DATA: Gross hematuria, 2 episodes in [REDACTED] and [REDACTED].

EXAM:
CT ABDOMEN AND PELVIS WITHOUT AND WITH CONTRAST
TECHNIQUE: Multidetector CT imaging of the abdomen and pelvis was performed
following the standard protocol before and following the bolus
administration of intravenous contrast.
CONTRAST:  100mL OMNIPAQUE IOHEXOL 300 MG/ML  SOLN

[Series 2: axial pre · axial · non-contrast · 0.91mm/px · z∈[+488,+784]mm · 5 of 89 slices shown, 10 images]
[im 15/89  soft-tissue]
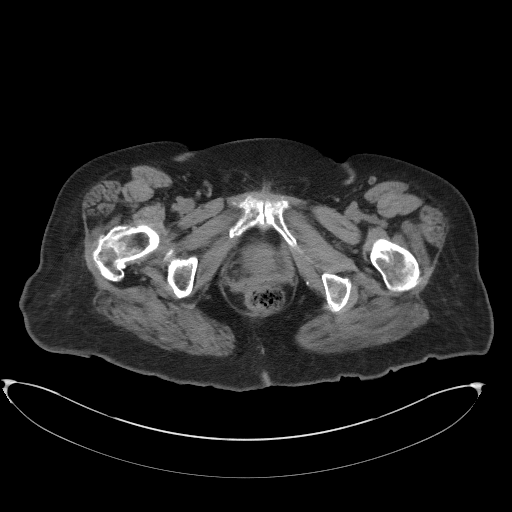
[im 15/89  bone]
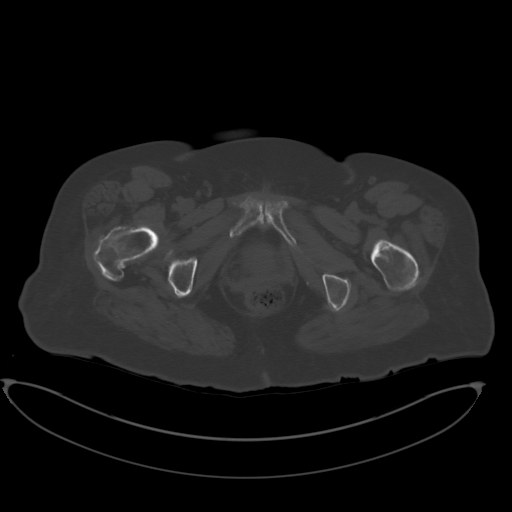
[im 30/89  soft-tissue]
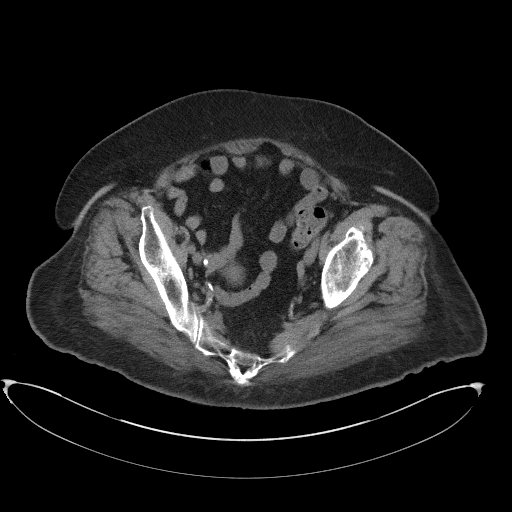
[im 30/89  lung]
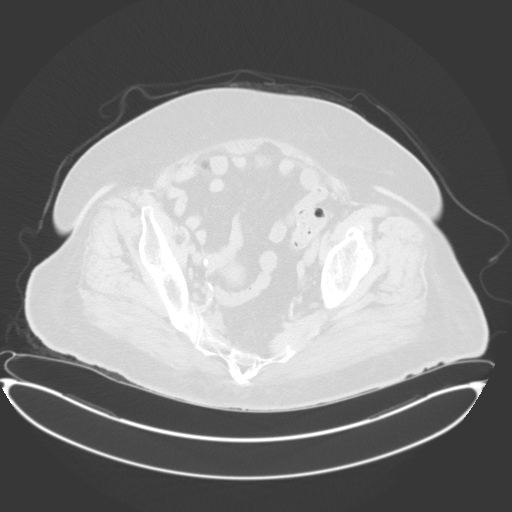
[im 45/89  soft-tissue]
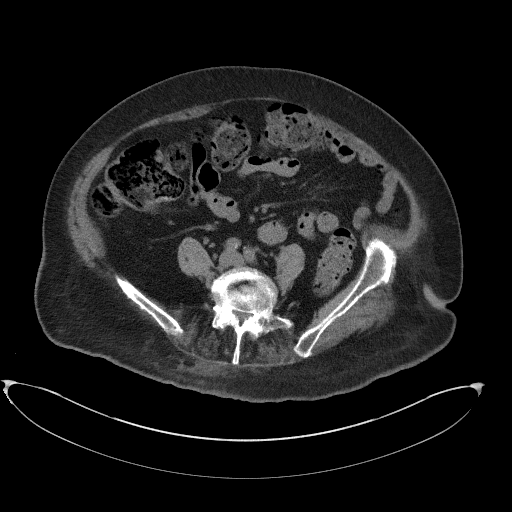
[im 45/89  lung]
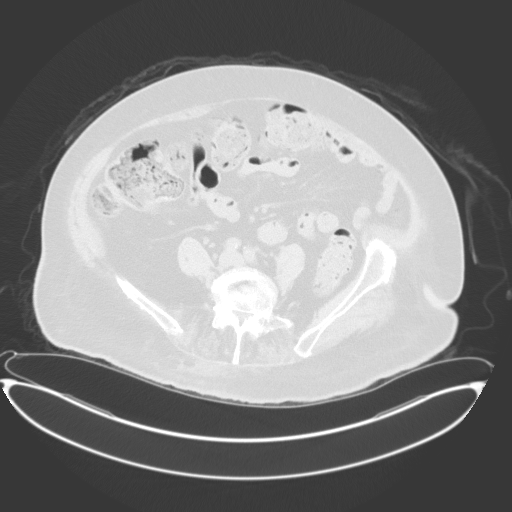
[im 59/89  soft-tissue]
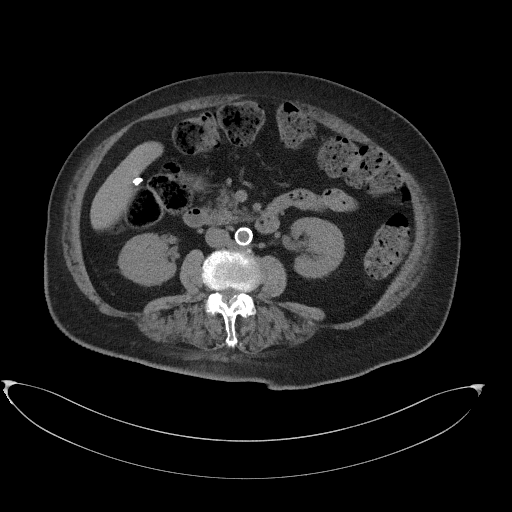
[im 59/89  lung]
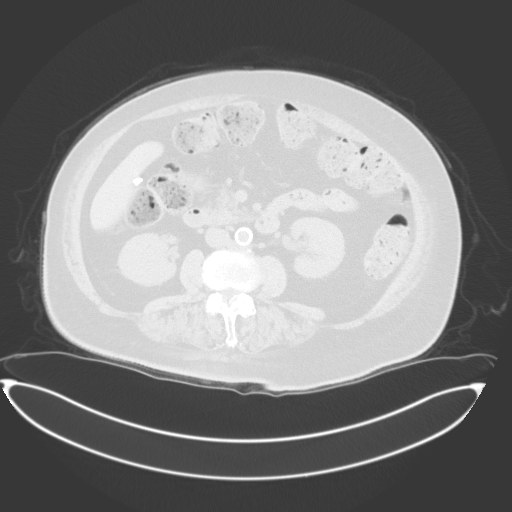
[im 74/89  soft-tissue]
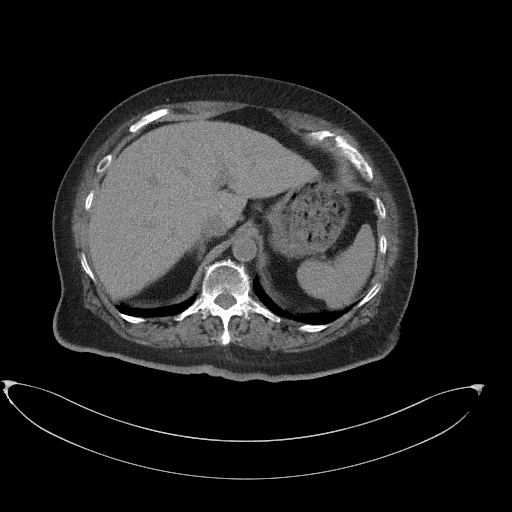
[im 74/89  lung]
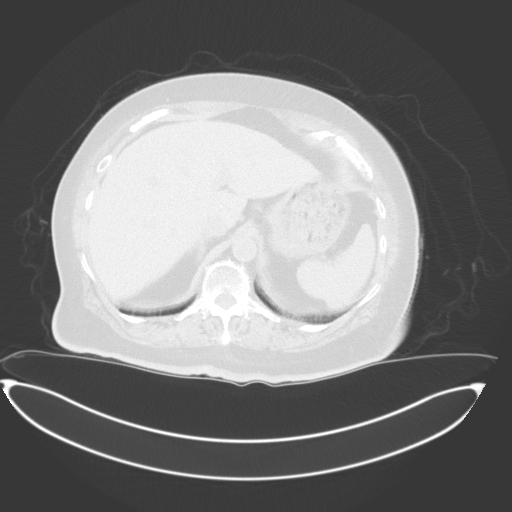

[Series 5: coronal pre · coronal · non-contrast · 0.83mm/px · 2 of 112 slices shown, 3 images]
[im 38/112  soft-tissue]
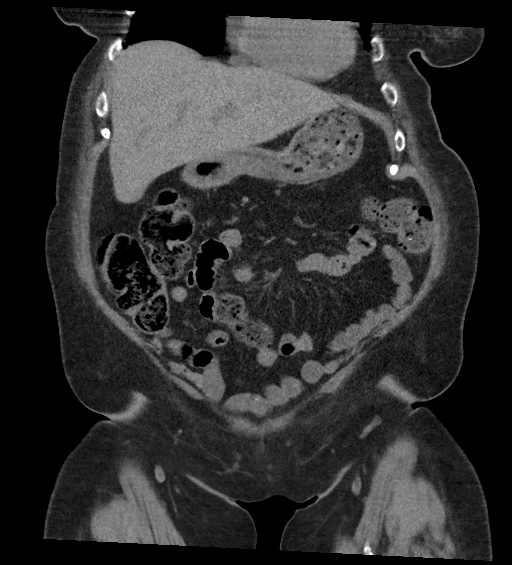
[im 38/112  bone]
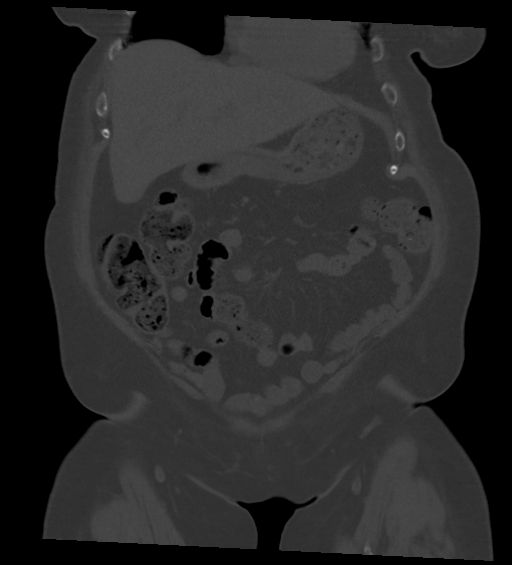
[im 75/112  soft-tissue]
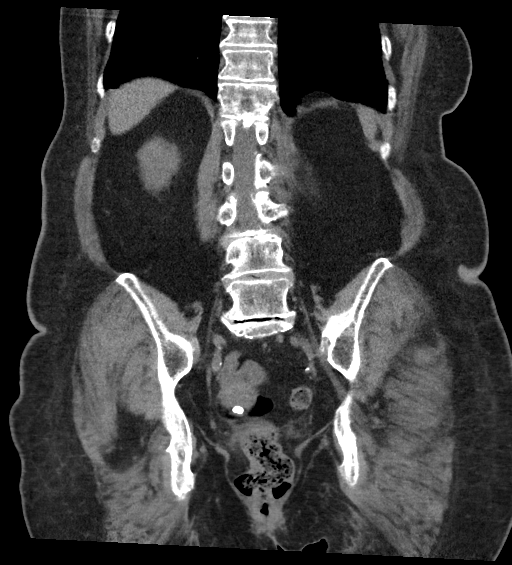

[Series 7: axial post · axial · 0.91mm/px · z∈[+498,+768]mm · 4 of 91 slices shown]
[im 19/91  soft-tissue]
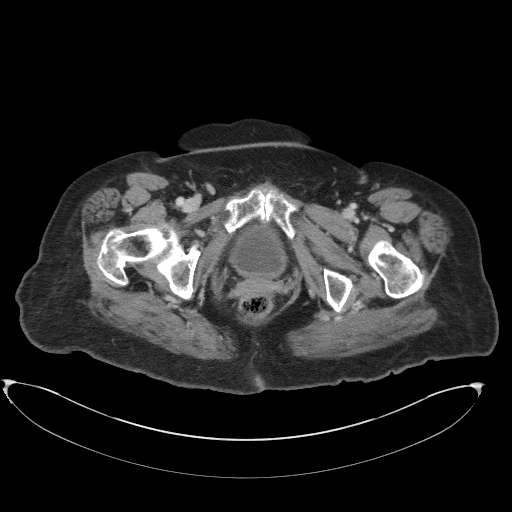
[im 37/91  soft-tissue]
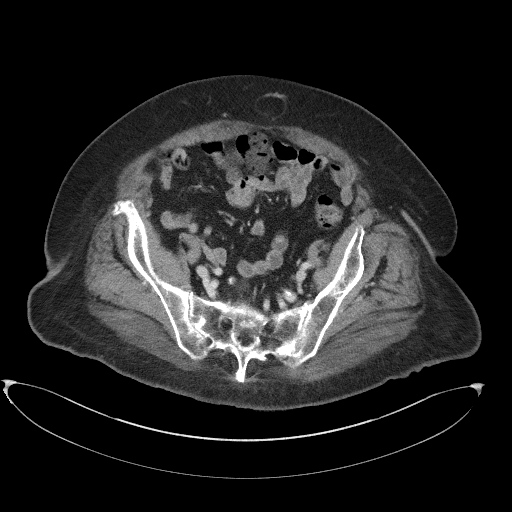
[im 55/91  soft-tissue]
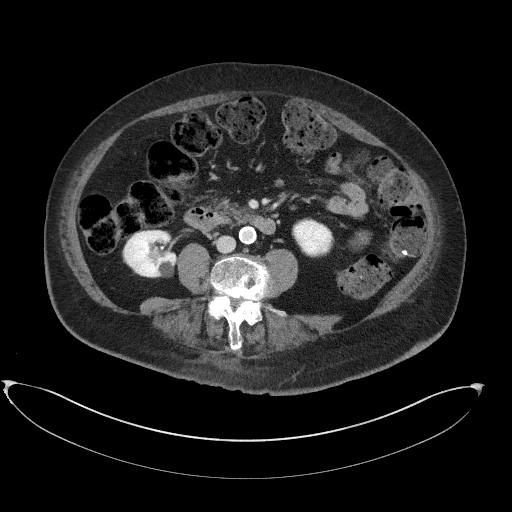
[im 73/91  soft-tissue]
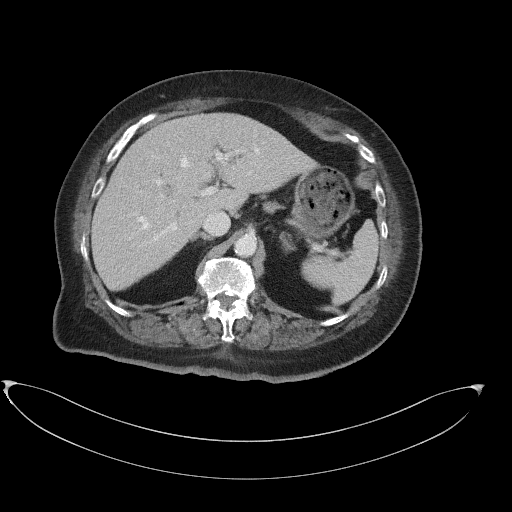

[11 of 46 positions shown; findings below may reference images not displayed]

FINDINGS: Lower chest: Incidental imaging of the lung bases is unremarkable.

Hepatobiliary: Post cholecystectomy. No focal, suspicious hepatic
lesion. Portal vein is patent. No biliary duct dilation.

Pancreas: Pancreas is normal without focal lesion or ductal
dilation.

Spleen: Spleen normal size without focal lesion.

Adrenals/Urinary Tract: Adrenal glands are normal.

Renal contours are smooth. No suspicious renal lesion. Mild
thickening along the bladder base without nodular features though
the bladder is under distended.

No signs of abnormal enhancement along the course of the ureters. No
nephrolithiasis.

Urothelial thickening on the RIGHT. Column of Bertin noted in the
interpolar RIGHT kidney. Caliceal elements in this area appear
irregular and or truncated best seen on image 67 of series 15 and 65
of series 15 in the setting of urothelial thickening involving
infundibular elements and the renal pelvis of the RIGHT kidney.

Stomach/Bowel: No acute gastric or small bowel process. Stomach is
under distended limiting assessment.

Stool fills much of the colon.

Vascular/Lymphatic: Calcified and noncalcified plaque of the
abdominal aorta. No aneurysmal dilation. No adenopathy.

No pelvic lymphadenopathy.

Reproductive: Uterus with calcified leiomyomata in the uterine body.

Other: Small fat containing umbilical hernia. No abdominal or pelvic
ascites.

Musculoskeletal: Spinal degenerative changes. No acute or
destructive bone process.
IMPRESSION: 1. Irregular appearance of interpolar collecting systems and
urothelium of the renal pelvis. While inflammation could be an
explanation for these findings in the setting of painless gross
hematuria upper tract lesion is considered. There is also a column
of Bertin extending into the renal pelvis which confound slightly
the examination ureteroscopic assessment may be helpful.
2. Mild circumferential thickening of the urinary bladder perhaps
slightly more prominent along the bladder base. This could be
assessed at cystoscopy but is likely due to under distension.
3. No nephrolithiasis or suspicious renal lesion.
4. Post cholecystectomy.
5. Aortic atherosclerosis.

Aortic Atherosclerosis ([id number].X).

## 2022-06-28 ENCOUNTER — Other Ambulatory Visit: Payer: Self-pay | Admitting: Hematology

## 2022-06-28 ENCOUNTER — Other Ambulatory Visit: Payer: Self-pay | Admitting: Internal Medicine

## 2022-06-28 DIAGNOSIS — C9000 Multiple myeloma not having achieved remission: Secondary | ICD-10-CM

## 2022-06-28 DIAGNOSIS — I10 Essential (primary) hypertension: Secondary | ICD-10-CM | POA: Diagnosis not present

## 2022-06-28 DIAGNOSIS — E1136 Type 2 diabetes mellitus with diabetic cataract: Secondary | ICD-10-CM | POA: Diagnosis not present

## 2022-06-28 DIAGNOSIS — L89322 Pressure ulcer of left buttock, stage 2: Secondary | ICD-10-CM | POA: Diagnosis not present

## 2022-06-28 DIAGNOSIS — L89312 Pressure ulcer of right buttock, stage 2: Secondary | ICD-10-CM | POA: Diagnosis not present

## 2022-06-28 DIAGNOSIS — M170 Bilateral primary osteoarthritis of knee: Secondary | ICD-10-CM

## 2022-06-28 DIAGNOSIS — D630 Anemia in neoplastic disease: Secondary | ICD-10-CM | POA: Diagnosis not present

## 2022-06-30 DIAGNOSIS — C9000 Multiple myeloma not having achieved remission: Secondary | ICD-10-CM | POA: Diagnosis not present

## 2022-06-30 DIAGNOSIS — L89322 Pressure ulcer of left buttock, stage 2: Secondary | ICD-10-CM | POA: Diagnosis not present

## 2022-06-30 DIAGNOSIS — I10 Essential (primary) hypertension: Secondary | ICD-10-CM | POA: Diagnosis not present

## 2022-06-30 DIAGNOSIS — L89312 Pressure ulcer of right buttock, stage 2: Secondary | ICD-10-CM | POA: Diagnosis not present

## 2022-06-30 DIAGNOSIS — D630 Anemia in neoplastic disease: Secondary | ICD-10-CM | POA: Diagnosis not present

## 2022-06-30 DIAGNOSIS — E1136 Type 2 diabetes mellitus with diabetic cataract: Secondary | ICD-10-CM | POA: Diagnosis not present

## 2022-07-01 ENCOUNTER — Other Ambulatory Visit: Payer: Self-pay

## 2022-07-01 MED ORDER — LENALIDOMIDE 10 MG PO CAPS: 10.0000 mg | ORAL_CAPSULE | Freq: Every day | ORAL | 0 refills | Status: DC

## 2022-07-01 NOTE — Telephone Encounter (Signed)
Chart reviewed. Revlimid refilled per last office note with Dr. Katragadda.  

## 2022-07-08 DIAGNOSIS — E1136 Type 2 diabetes mellitus with diabetic cataract: Secondary | ICD-10-CM | POA: Diagnosis not present

## 2022-07-08 DIAGNOSIS — D630 Anemia in neoplastic disease: Secondary | ICD-10-CM | POA: Diagnosis not present

## 2022-07-08 DIAGNOSIS — L89322 Pressure ulcer of left buttock, stage 2: Secondary | ICD-10-CM | POA: Diagnosis not present

## 2022-07-08 DIAGNOSIS — C9000 Multiple myeloma not having achieved remission: Secondary | ICD-10-CM | POA: Diagnosis not present

## 2022-07-08 DIAGNOSIS — L89312 Pressure ulcer of right buttock, stage 2: Secondary | ICD-10-CM | POA: Diagnosis not present

## 2022-07-08 DIAGNOSIS — I10 Essential (primary) hypertension: Secondary | ICD-10-CM | POA: Diagnosis not present

## 2022-07-09 ENCOUNTER — Other Ambulatory Visit: Payer: Self-pay

## 2022-07-09 DIAGNOSIS — M25561 Pain in right knee: Secondary | ICD-10-CM | POA: Diagnosis not present

## 2022-07-09 DIAGNOSIS — E1136 Type 2 diabetes mellitus with diabetic cataract: Secondary | ICD-10-CM | POA: Diagnosis not present

## 2022-07-09 DIAGNOSIS — Z6834 Body mass index (BMI) 34.0-34.9, adult: Secondary | ICD-10-CM | POA: Diagnosis not present

## 2022-07-09 DIAGNOSIS — M25562 Pain in left knee: Secondary | ICD-10-CM | POA: Diagnosis not present

## 2022-07-09 DIAGNOSIS — M1990 Unspecified osteoarthritis, unspecified site: Secondary | ICD-10-CM | POA: Diagnosis not present

## 2022-07-09 DIAGNOSIS — Z7984 Long term (current) use of oral hypoglycemic drugs: Secondary | ICD-10-CM | POA: Diagnosis not present

## 2022-07-09 DIAGNOSIS — L89312 Pressure ulcer of right buttock, stage 2: Secondary | ICD-10-CM | POA: Diagnosis not present

## 2022-07-09 DIAGNOSIS — I10 Essential (primary) hypertension: Secondary | ICD-10-CM | POA: Diagnosis not present

## 2022-07-09 DIAGNOSIS — D630 Anemia in neoplastic disease: Secondary | ICD-10-CM | POA: Diagnosis not present

## 2022-07-09 DIAGNOSIS — E876 Hypokalemia: Secondary | ICD-10-CM | POA: Diagnosis not present

## 2022-07-09 DIAGNOSIS — L89322 Pressure ulcer of left buttock, stage 2: Secondary | ICD-10-CM | POA: Diagnosis not present

## 2022-07-09 DIAGNOSIS — E039 Hypothyroidism, unspecified: Secondary | ICD-10-CM | POA: Diagnosis not present

## 2022-07-09 DIAGNOSIS — Z7952 Long term (current) use of systemic steroids: Secondary | ICD-10-CM | POA: Diagnosis not present

## 2022-07-09 DIAGNOSIS — E785 Hyperlipidemia, unspecified: Secondary | ICD-10-CM | POA: Diagnosis not present

## 2022-07-09 DIAGNOSIS — Z792 Long term (current) use of antibiotics: Secondary | ICD-10-CM | POA: Diagnosis not present

## 2022-07-09 DIAGNOSIS — E669 Obesity, unspecified: Secondary | ICD-10-CM | POA: Diagnosis not present

## 2022-07-09 DIAGNOSIS — H409 Unspecified glaucoma: Secondary | ICD-10-CM | POA: Diagnosis not present

## 2022-07-09 DIAGNOSIS — K219 Gastro-esophageal reflux disease without esophagitis: Secondary | ICD-10-CM | POA: Diagnosis not present

## 2022-07-09 DIAGNOSIS — C9000 Multiple myeloma not having achieved remission: Secondary | ICD-10-CM

## 2022-07-12 ENCOUNTER — Inpatient Hospital Stay: Payer: Medicare Other | Attending: Hematology

## 2022-07-12 ENCOUNTER — Encounter: Payer: Self-pay | Admitting: *Deleted

## 2022-07-12 ENCOUNTER — Other Ambulatory Visit: Payer: Self-pay | Admitting: Internal Medicine

## 2022-07-12 DIAGNOSIS — N390 Urinary tract infection, site not specified: Secondary | ICD-10-CM | POA: Diagnosis not present

## 2022-07-12 DIAGNOSIS — R059 Cough, unspecified: Secondary | ICD-10-CM | POA: Insufficient documentation

## 2022-07-12 DIAGNOSIS — C9000 Multiple myeloma not having achieved remission: Secondary | ICD-10-CM | POA: Insufficient documentation

## 2022-07-12 DIAGNOSIS — R051 Acute cough: Secondary | ICD-10-CM | POA: Insufficient documentation

## 2022-07-12 DIAGNOSIS — R61 Generalized hyperhidrosis: Secondary | ICD-10-CM | POA: Insufficient documentation

## 2022-07-12 DIAGNOSIS — Z79899 Other long term (current) drug therapy: Secondary | ICD-10-CM | POA: Diagnosis not present

## 2022-07-12 DIAGNOSIS — R251 Tremor, unspecified: Secondary | ICD-10-CM | POA: Insufficient documentation

## 2022-07-12 LAB — CBC WITH DIFFERENTIAL/PLATELET
Abs Immature Granulocytes: 0.3 10*3/uL — ABNORMAL HIGH (ref 0.00–0.07)
Band Neutrophils: 2 %
Basophils Absolute: 0 10*3/uL (ref 0.0–0.1)
Basophils Relative: 0 %
Eosinophils Absolute: 0 10*3/uL (ref 0.0–0.5)
Eosinophils Relative: 0 %
HCT: 32.2 % — ABNORMAL LOW (ref 36.0–46.0)
Hemoglobin: 10.4 g/dL — ABNORMAL LOW (ref 12.0–15.0)
Lymphocytes Relative: 15 %
Lymphs Abs: 1 10*3/uL (ref 0.7–4.0)
MCH: 30.4 pg (ref 26.0–34.0)
MCHC: 32.3 g/dL (ref 30.0–36.0)
MCV: 94.2 fL (ref 80.0–100.0)
Metamyelocytes Relative: 5 %
Monocytes Absolute: 0.3 10*3/uL (ref 0.1–1.0)
Monocytes Relative: 4 %
Neutro Abs: 5 10*3/uL (ref 1.7–7.7)
Neutrophils Relative %: 74 %
Platelets: 107 10*3/uL — ABNORMAL LOW (ref 150–400)
RBC: 3.42 MIL/uL — ABNORMAL LOW (ref 3.87–5.11)
RDW: 16 % — ABNORMAL HIGH (ref 11.5–15.5)
WBC: 6.6 10*3/uL (ref 4.0–10.5)
nRBC: 0 % (ref 0.0–0.2)

## 2022-07-12 LAB — COMPREHENSIVE METABOLIC PANEL
ALT: 13 U/L (ref 0–44)
AST: 14 U/L — ABNORMAL LOW (ref 15–41)
Albumin: 2.7 g/dL — ABNORMAL LOW (ref 3.5–5.0)
Alkaline Phosphatase: 58 U/L (ref 38–126)
Anion gap: 9 (ref 5–15)
BUN: 21 mg/dL (ref 8–23)
CO2: 22 mmol/L (ref 22–32)
Calcium: 8 mg/dL — ABNORMAL LOW (ref 8.9–10.3)
Chloride: 103 mmol/L (ref 98–111)
Creatinine, Ser: 1.12 mg/dL — ABNORMAL HIGH (ref 0.44–1.00)
GFR, Estimated: 50 mL/min — ABNORMAL LOW (ref >60)
Glucose, Bld: 243 mg/dL — ABNORMAL HIGH (ref 70–99)
Potassium: 3.8 mmol/L (ref 3.5–5.1)
Sodium: 134 mmol/L — ABNORMAL LOW (ref 135–145)
Total Bilirubin: 1.2 mg/dL (ref 0.3–1.2)
Total Protein: 6.1 g/dL — ABNORMAL LOW (ref 6.5–8.1)

## 2022-07-12 NOTE — Progress Notes (Signed)
[facility] [address] [place] [address] Phone: [phone] Fax: [phone]  SYMPTOM MANAGEMENT CLINIC PROGRESS NOTE   Kim Mills [id number] 1940-11-01 82 y.o.  DDNNA KOHR is managed by Dr. Ellin Saba for IgG lambda plasma cell myeloma.  Actively treated with chemotherapy/immunotherapy/hormonal therapy: YES  Current therapy: Revlimid 10 mg 2 weeks on/1 week off + dexamethasone 20 mg weekly  Supportive therapy: Denosumab monthly  INTERVAL HISTORY:  Chief Complaint: Tremors and night sweats  Kim Mills is seen today per family request.  Patient is poor historian, but her daughter is present today to assist with history.  Patient has had episodes of "uncontrolled shaking" since Saturday 5/31.  Associated symptoms include cold sweats and multiple episodes of drenching night sweats.  She has not had any similar episodes in the past.  Patient has been more confused and lethargic.  She has not had any neurologic deficits.  She has appeared to have some difficulty swallowing, as she has been holding pills in her mouth and not wanting to swallow them.  She had complained of painful swallowing the week prior.  She has not had any fevers.  She continues to have cough, which is slightly increased and productive of clear phlegm.  She does not have any significant dyspnea.  She continues to see wound care nurse for pressure sores on her buttocks.  Energy reported 40%, with appetite about 50%.  ASSESSMENT & PLAN:  ## IgG LAMBDA PLASMA CELL MYELOMA - Primary medical oncologist is Dr. Ellin Saba - Current therapy: Revlimid 10 mg 2 weeks on/1 week off + dexamethasone 20 mg weekly - Supportive therapy: Denosumab monthly - PLAN: Next scheduled visit with Dr. Ellin Saba on Monday, 07/19/2022 - Per Dr. Ellin Saba, patient can hold Revlimid and dexamethasone until follow-up next week.  # Tremors and night sweats - Episodes of "uncontrolled  shaking" since Saturday 5/31.  Associated symptoms include cold sweats and multiple episodes of drenching night sweats.  She has not had any similar episodes in the past.  Patient has been more confused and lethargic.  She has not had any neurologic deficits. - DIFFERENTIAL DIAGNOSIS: Infectious etiology favored given acute nature of symptoms and other findings as discussed below Although tramadol can be associated with tremors and serotonin syndrome, she is only taking about 50 mg twice daily, so this is unlikely. Revlimid has been associated with drowsiness, dysphagia, and tremors.  However, this is considered less likely due to acute onset of symptoms and association with infectious findings.  # Urinary tract infection - She recently had E. coli UTI treated with Cipro x 5 days (05/27/22 - 06/01/22) - Urinalysis today (07/13/2022) suggests UTI  - PLAN: Prescription for Macrobid 100 mg twice daily sent to pharmacy, based on sensitivity from last urine culture.  Will follow results of current urine culture and change therapy if indicated. - If any worsening symptoms or fever, patient advised to proceed to emergency department.  # Cough - Patient was treated for questionable pneumonia with Augmentin x 5 days from 06/01/2022 through 06/06/2022 - She has continued to have cough productive of clear sputum and mild shortness of breath - CXR today (07/13/2022) appears similar to previous, as reviewed by me and confirmed by radiologist with "low lung volumes and left basilar atelectasis or infiltrate, similar to prior study" - PLAN: Expectorant (guaifenesin) sent to pharmacy.  Instructions given for deep breathing exercises to improve pulmonary clearance. - If any worsening of respiratory symptoms or development of  fever, patient advised to proceed to emergency department.  # Difficulty swallowing - Has appeared to have some difficulty swallowing, as she has been holding pills in her mouth and not wanting to  swallow them.  She had complained of painful swallowing the week prior. - She did not have any evidence of focal neurologic deficit on exam. - No evidence of oral thrush on exam, but unable to adequately visualize oropharyngeal mucous membranes - She is at risk for thrush due to oral dexamethasone treatment. - PLAN: Will treat empirically with nystatin solution swallowed 4 times daily x 1 week. - Consider swallow study if symptoms persist after treatment.  PLAN SUMMARY: >> Next scheduled appointment with medical oncologist: Monday, 07/19/2022   REVIEW OF SYSTEMS:   Review of Systems  Constitutional:  Positive for chills, diaphoresis and fatigue. Negative for activity change, appetite change, fever and unexpected weight change.  HENT:  Positive for trouble swallowing. Negative for mouth sores, nosebleeds and sore throat.   Respiratory:  Positive for cough and shortness of breath.   Cardiovascular:  Negative for chest pain, palpitations and leg swelling.  Gastrointestinal:  Positive for constipation, diarrhea and nausea. Negative for abdominal pain, blood in stool and vomiting.  Genitourinary:  Negative for dysuria and hematuria.  Neurological:  Positive for dizziness, tremors and numbness. Negative for light-headedness and headaches.  Psychiatric/Behavioral:  Negative for dysphoric mood and sleep disturbance. The patient is not nervous/anxious.     Past Medical History, Surgical history, Social history, and Family history were reviewed as documented elsewhere in chart, and were updated as appropriate.   OBJECTIVE:  Physical Exam:  There were no vitals taken for this visit. ECOG: 3  Physical Exam Constitutional:      Appearance: Normal appearance. She is obese.  Cardiovascular:     Heart sounds: Normal heart sounds.  Pulmonary:     Breath sounds: Normal breath sounds. Decreased air movement present.     Comments: Diminished breath sounds bibasilarly Neurological:     General: No  focal deficit present.     Mental Status: Mental status is at baseline.  Psychiatric:        Behavior: Behavior normal. Behavior is cooperative.     Lab Review:     Component Value Date/Time   NA 134 (L) 07/12/2022 0825   NA 137 09/24/2020 1517   K 3.8 07/12/2022 0825   CL 103 07/12/2022 0825   CO2 22 07/12/2022 0825   GLUCOSE 243 (H) 07/12/2022 0825   BUN 21 07/12/2022 0825   BUN 21 09/24/2020 1517   CREATININE 1.12 (H) 07/12/2022 0825   CREATININE 0.88 03/19/2020 0936   CALCIUM 8.0 (L) 07/12/2022 0825   PROT 6.1 (L) 07/12/2022 0825   ALBUMIN 2.7 (L) 07/12/2022 0825   AST 14 (L) 07/12/2022 0825   ALT 13 07/12/2022 0825   ALKPHOS 58 07/12/2022 0825   BILITOT 1.2 07/12/2022 0825   GFRNONAA 50 (L) 07/12/2022 0825   GFRNONAA 41 (L) 04/13/2019 1241   GFRAA >60 11/12/2019 0950   GFRAA 48 (L) 04/13/2019 1241       Component Value Date/Time   WBC 6.6 07/12/2022 0825   RBC 3.42 (L) 07/12/2022 0825   HGB 10.4 (L) 07/12/2022 0825   HCT 32.2 (L) 07/12/2022 0825   PLT 107 (L) 07/12/2022 0825   MCV 94.2 07/12/2022 0825   MCH 30.4 07/12/2022 0825   MCHC 32.3 07/12/2022 0825   RDW 16.0 (H) 07/12/2022 0825   LYMPHSABS 1.0 07/12/2022 0825  MONOABS 0.3 07/12/2022 0825   EOSABS 0.0 07/12/2022 0825   BASOSABS 0.0 07/12/2022 0825   -------------------------------  Imaging from last 24 hours (if applicable): Radiology interpretation: No results found.    WRAP UP:  All questions were answered. The patient knows to call the clinic with any problems, questions or concerns.  Medical decision making: High  Time spent on visit: I spent 40 minutes counseling the patient face to face. The total time spent in the appointment was 60 minutes and more than 50% was on counseling.  Carnella Guadalajara, PA-C  07/13/22 1:17 PM

## 2022-07-12 NOTE — Progress Notes (Signed)
Son brought patient in for labs this morning and expressed that she is not eating well, having severe night sweats, with absence of fever, constant shaking and new onset of difficulty swallowing.  Added to symptom management schedule for tomorrow.  Rojelio Brenner, PA-C aware.

## 2022-07-13 ENCOUNTER — Ambulatory Visit (HOSPITAL_COMMUNITY)
Admission: RE | Admit: 2022-07-13 | Discharge: 2022-07-13 | Disposition: A | Discharge status: Home / Self Care | Payer: Medicare Other | Source: Ambulatory Visit | Attending: Physician Assistant | Admitting: Physician Assistant

## 2022-07-13 ENCOUNTER — Inpatient Hospital Stay (HOSPITAL_BASED_OUTPATIENT_CLINIC_OR_DEPARTMENT_OTHER): Payer: Medicare Other | Admitting: Physician Assistant

## 2022-07-13 VITALS — BP 115/65 | HR 94 | Temp 97.9°F | Resp 19

## 2022-07-13 DIAGNOSIS — B3781 Candidal esophagitis: Secondary | ICD-10-CM

## 2022-07-13 DIAGNOSIS — R6883 Chills (without fever): Secondary | ICD-10-CM | POA: Diagnosis not present

## 2022-07-13 DIAGNOSIS — R051 Acute cough: Secondary | ICD-10-CM

## 2022-07-13 DIAGNOSIS — C9000 Multiple myeloma not having achieved remission: Secondary | ICD-10-CM | POA: Diagnosis not present

## 2022-07-13 DIAGNOSIS — R61 Generalized hyperhidrosis: Secondary | ICD-10-CM | POA: Diagnosis not present

## 2022-07-13 DIAGNOSIS — R251 Tremor, unspecified: Secondary | ICD-10-CM | POA: Diagnosis not present

## 2022-07-13 DIAGNOSIS — N3000 Acute cystitis without hematuria: Secondary | ICD-10-CM | POA: Diagnosis not present

## 2022-07-13 DIAGNOSIS — R0602 Shortness of breath: Secondary | ICD-10-CM | POA: Diagnosis not present

## 2022-07-13 DIAGNOSIS — R059 Cough, unspecified: Secondary | ICD-10-CM | POA: Diagnosis not present

## 2022-07-13 DIAGNOSIS — N390 Urinary tract infection, site not specified: Secondary | ICD-10-CM | POA: Diagnosis not present

## 2022-07-13 DIAGNOSIS — Z79899 Other long term (current) drug therapy: Secondary | ICD-10-CM | POA: Diagnosis not present

## 2022-07-13 LAB — URINALYSIS, DIPSTICK ONLY
Bilirubin Urine: NEGATIVE
Glucose, UA: 150 mg/dL — AB
Ketones, ur: 20 mg/dL — AB
Nitrite: NEGATIVE
Protein, ur: 100 mg/dL — AB
Specific Gravity, Urine: 1.015 (ref 1.005–1.030)
pH: 5 (ref 5.0–8.0)

## 2022-07-13 LAB — KAPPA/LAMBDA LIGHT CHAINS
Kappa free light chain: 31 mg/L — ABNORMAL HIGH (ref 3.3–19.4)
Kappa, lambda light chain ratio: 1.12 (ref 0.26–1.65)
Lambda free light chains: 27.6 mg/L — ABNORMAL HIGH (ref 5.7–26.3)

## 2022-07-13 MED ORDER — NYSTATIN 100000 UNIT/ML MT SUSP
5.0000 mL | Freq: Four times a day (QID) | OROMUCOSAL | 0 refills | Status: DC
Start: 2022-07-13 — End: 2022-07-19

## 2022-07-13 MED ORDER — GUAIFENESIN ER 600 MG PO TB12
600.0000 mg | ORAL_TABLET | Freq: Two times a day (BID) | ORAL | 0 refills | Status: DC
Start: 2022-07-13 — End: 2022-08-03

## 2022-07-13 MED ORDER — NITROFURANTOIN MONOHYD MACRO 100 MG PO CAPS
100.0000 mg | ORAL_CAPSULE | Freq: Two times a day (BID) | ORAL | 0 refills | Status: DC
Start: 2022-07-13 — End: 2022-08-03

## 2022-07-13 NOTE — Patient Instructions (Signed)
[facility] **VISIT SUMMARY & IMPORTANT INSTRUCTIONS **   You were seen today by Rojelio Brenner PA-C for your symptom management visit.    TREMORS & NIGHT SWEATS: Since the symptoms are acute, they are most likely related to infection.  Your urine study shows that you may have a urinary tract infection. Prescription sent to pharmacy for nitrofurantoin (Macrobid).  Take this twice daily x 10 days. If you have any worsening of your symptoms, or start to run a fever, please go to the Emergency Department.  PAINFUL SWALLOWING Will send a prescription to your pharmacy for nystatin liquid.  Swallow this 4 times daily for the next week for treatment of possible thrush. If you continue to have difficulty swallowing in the future, we will send you for a swallow study evaluation. It is okay for you to not take your Revlimid and dexamethasone until you see Dr. Ellin Saba next week. You will need to check with your other doctors to see if those medications can also be held, or if they should be crushed and put in applesauce so that they are easier to swallow.  COUGH Your chest x-ray did not show obvious pneumonia, but did show that you might not be taking deep breaths often enough. Take guaifenesin (Mucinex) to help you cough up more mucus. Practice taking deep breaths throughout the day to improve airflow in your lungs and decrease your risk for pneumonia.  **Some of your symptoms may possibly be side effects of your Revlimid and dexamethasone, but this is less likely due to their sudden and acute onset.  You can discuss this further with Dr. Ellin Saba at your follow-up visit.  FOLLOW-UP APPOINTMENT: Office visit with Dr. Ellin Saba on Monday, 07/19/2022  ** Thank you for trusting me with your healthcare!  I strive to provide all of my patients with quality care at each visit.  If you receive a survey for this visit, I would be so grateful to you for taking the  time to provide feedback.  Thank you in advance!  ~ Estephania Licciardi                   Dr. Doreatha Massed   &   Rojelio Brenner, PA-C   - - - - - - - - - - - - - - - - - -    Thank you for choosing [facility] to provide your oncology and hematology care.  To afford each patient quality time with our provider, please arrive at least 15 minutes before your scheduled appointment time.   If you have a lab appointment with the Cancer Center please come in thru the Main Entrance and check in at the main information desk.  You need to re-schedule your appointment should you arrive 10 or more minutes late.  We strive to give you quality time with our providers, and arriving late affects you and other patients whose appointments are after yours.  Also, if you no show three or more times for appointments you may be dismissed from the clinic at the providers discretion.     Again, thank you for choosing [facility].  Our hope is that these requests will decrease the amount of time that you wait before being seen by our physicians.       _____________________________________________________________  Should you have questions after your visit to [facility], please contact our office at [phone]  and follow the prompts.  Our office hours are 8:00 a.m. and 4:30 p.m. Monday - Friday.  Please note that voicemails left after 4:00 p.m. may not be returned until the following business day.  We are closed weekends and major holidays.  You do have access to a nurse 24-7, just call the main number to the clinic [phone] and do not press any options, hold on the line and a nurse will answer the phone.    For prescription refill requests, have your pharmacy contact our office and allow 72 hours.

## 2022-07-14 DIAGNOSIS — E1136 Type 2 diabetes mellitus with diabetic cataract: Secondary | ICD-10-CM | POA: Diagnosis not present

## 2022-07-14 DIAGNOSIS — L89322 Pressure ulcer of left buttock, stage 2: Secondary | ICD-10-CM | POA: Diagnosis not present

## 2022-07-14 DIAGNOSIS — L89312 Pressure ulcer of right buttock, stage 2: Secondary | ICD-10-CM | POA: Diagnosis not present

## 2022-07-14 DIAGNOSIS — C9000 Multiple myeloma not having achieved remission: Secondary | ICD-10-CM | POA: Diagnosis not present

## 2022-07-14 DIAGNOSIS — D630 Anemia in neoplastic disease: Secondary | ICD-10-CM | POA: Diagnosis not present

## 2022-07-14 DIAGNOSIS — I10 Essential (primary) hypertension: Secondary | ICD-10-CM | POA: Diagnosis not present

## 2022-07-14 LAB — URINE CULTURE

## 2022-07-15 LAB — PROTEIN ELECTROPHORESIS, SERUM
A/G Ratio: 0.9 (ref 0.7–1.7)
Albumin ELP: 2.5 g/dL — ABNORMAL LOW (ref 2.9–4.4)
Alpha-1-Globulin: 0.4 g/dL (ref 0.0–0.4)
Alpha-2-Globulin: 0.8 g/dL (ref 0.4–1.0)
Beta Globulin: 1 g/dL (ref 0.7–1.3)
Gamma Globulin: 0.6 g/dL (ref 0.4–1.8)
Globulin, Total: 2.9 g/dL (ref 2.2–3.9)
M-Spike, %: 0.3 g/dL — ABNORMAL HIGH
Total Protein ELP: 5.4 g/dL — ABNORMAL LOW (ref 6.0–8.5)

## 2022-07-15 LAB — URINE CULTURE: Culture: >=100000 — AB

## 2022-07-17 DIAGNOSIS — L89312 Pressure ulcer of right buttock, stage 2: Secondary | ICD-10-CM | POA: Diagnosis not present

## 2022-07-17 DIAGNOSIS — I10 Essential (primary) hypertension: Secondary | ICD-10-CM | POA: Diagnosis not present

## 2022-07-17 DIAGNOSIS — L89322 Pressure ulcer of left buttock, stage 2: Secondary | ICD-10-CM | POA: Diagnosis not present

## 2022-07-17 DIAGNOSIS — D630 Anemia in neoplastic disease: Secondary | ICD-10-CM | POA: Diagnosis not present

## 2022-07-17 DIAGNOSIS — C9000 Multiple myeloma not having achieved remission: Secondary | ICD-10-CM | POA: Diagnosis not present

## 2022-07-17 DIAGNOSIS — E1136 Type 2 diabetes mellitus with diabetic cataract: Secondary | ICD-10-CM | POA: Diagnosis not present

## 2022-07-18 NOTE — Progress Notes (Signed)
[facility] [address] [place] [address]    Clinic Day:  07/18/2022  Referring physician: [name], MD  Patient Care Team: [name], MD as PCP - General (Internal Medicine) Laqueta Linden, MD (Inactive) as PCP - Cardiology (Cardiology) Corbin Ade, MD as Consulting Physician (Gastroenterology) Doreatha Massed, MD as Medical Oncologist (Medical Oncology) Erroll Luna, [facility] as Pharmacist (Pharmacist)   ASSESSMENT & PLAN:   Assessment: 1.  Normocytic anemia: -Patient seen at the request of Dr. Jeanice Lim for further work-up and management of normocytic anemia. -Recent CBC on 08/21/2019 shows hemoglobin 6.9 with MCV of 88.3.  White count and platelets are normal.  No history of CKD.  Denies any bleeding per rectum or melena. Last colonoscopy on 04/01/2014 shows diffusely pigmented rectal mucosa consistent with melanosis [name]  Diffusely pigmented colonic mucosa, 2 diminutive polyps in the mid ascending segment, otherwise the remainder of the colon mucosa was normal.  Biopsy consistent with tubular adenoma. -CT renal study on 05/11/2019 shows normal-sized spleen with normal liver.  No other abnormal adenopathy. -Denies any prior history of blood transfusion.  She is currently taking iron tablet twice daily.   2.  IgG lambda plasma cell myeloma: -Skeletal survey on 09/12/2019 shows diffuse faint lucencies in the skull, bilateral femurs. -Bone marrow biopsy on 09/27/2019 shows 70% plasma cells in hypercellular marrow.  Chromosome analysis was 62, XX. FISH panel was normal.  However quality of specimen is compromised. -LDH normal, beta-2 microglobulin 2.9.  M spike was 2.1 g.  Kappa light chains 19.7, lambda light chains 14.3 with ratio of 1.38. -24-hour urine shows nonnephrotic range proteinuria.  Urine immunofixation was positive. - 9 cycles of RVD from 10/29/2019 through 05/02/2020. - Because of her age and  comorbidities, she was not considered a candidate for bone marrow transplant. - Maintenance Revlimid was held due to severe weakness. - Revlimid 10 mg 2 weeks on/1 week off restarted back on 06/03/2021 due to worsening M spike.    Plan: 1.  IgG lambda plasma cell myeloma: - Reviewed myeloma labs from 05/24/2022: M spike is stable at 0.4 g.  Free light chain ratio improved to 1.25 with kappa light chains of 31. - Continue Revlimid 10 mg 2 weeks on/1 week off and dexamethasone 20 mg weekly. - She had a UTI with E. coli and was treated with Cipro for 5 days on 05/26/2021.  She went to the ER on 06/01/2022 and was given Augmentin and Z-Pak which she finished yesterday.  She reported some diarrhea for the last few days but last 2 days stool has been thickening up. - I have recommended stool C. difficile and GI panel if she continues to have watery diarrhea.  Most likely antibiotic induced. - She also developed pressure sores in the buttock region.  We will make a referral to wound care nurse.  She was told to use guaifenesin for congestion.  She was also told to use donut pillow while sitting and try to be active as much as possible. - RTC 6 weeks with repeat myeloma panel.   2.  Hypomagnesemia: - Continue magnesium 3 times daily.   3.  Bilateral knee pains: - Continue tramadol as needed.   4.  Bone protection: - Latest calcium level is 9.1.  Continue denosumab monthly.  Continue calcium supplements at home.  5.  Hypokalemia: - Continue potassium supplements.  Potassium is normal.    No orders of the defined  types were placed in this encounter.     I,Katie Daubenspeck,acting as a Neurosurgeon for Doreatha Massed, MD.,have documented all relevant documentation on the behalf of Doreatha Massed, MD,as directed by  Doreatha Massed, MD while in the presence of Doreatha Massed, MD.   ***  Mickie Bail   6/9/20249:44 PM  CHIEF COMPLAINT:   Diagnosis: multiple myeloma &  normocytic anemia   Cancer Staging  No matching staging information was found for the patient.   Prior Therapy: 1. Velcade x 9 cycles from 10/29/2019 to 05/02/2020 2. Revlimid 2/3 weeks, held due to severe weakness   Current Therapy:  Revlimid and dexamethasone    HISTORY OF PRESENT ILLNESS:   Oncology History  Multiple myeloma not having achieved remission (HCC)  10/13/2019 Initial Diagnosis   Multiple myeloma not having achieved remission (HCC)   10/29/2019 - 05/02/2020 Chemotherapy   Patient is on Treatment Plan : MYELOMA NON-TRANSPLANT CANDIDATES VRd weekly q21d        INTERVAL HISTORY:   Kim Mills is a 82 y.o. female presenting to clinic today for follow up of multiple myeloma & normocytic anemia. She was last seen by me on 06/07/22. She was also seen by PA Rebekah in our symptom management clinic on 07/13/22.  Today, she states that she is doing well overall. Her appetite level is at ***%. Her energy level is at ***%.  PAST MEDICAL HISTORY:   Past Medical History: Past Medical History:  Diagnosis Date   Arthritis    Cancer (HCC)    Cataract    Chest pain    Associated with weakness and fatigue   Diabetes mellitus    A1c of 7.4 in 08/2010   GERD (gastroesophageal reflux disease)    Glaucoma    Hyperlipidemia    Lipid profile in 08/2010:190, 121, 48, 118; normal CBC and CMet   Hypertension    Lipid profile in 08/2010:190, 121, 48, 118; normal CBC and CMet   Hypothyroidism    Hypothyroidism    Obesity     Surgical History: Past Surgical History:  Procedure Laterality Date   CHOLECYSTECTOMY     COLONOSCOPY N/A 04/01/2014   RMR: Melanosis coli. colonic polyps removed as described above.    CYSTOSCOPY W/ URETERAL STENT PLACEMENT Right 09/13/2019   Procedure: CYSTOSCOPY WITH RIGHT  RETROGRADE PYELOGRAM; BLADDER BIOPSY;  Surgeon: Malen Gauze, MD;  Location: [facility];  Service: Urology;  Laterality: Right;   LEFT HEART CATH AND CORONARY ANGIOGRAPHY N/A 10/07/2017    Procedure: LEFT HEART CATH AND CORONARY ANGIOGRAPHY;  Surgeon: Lennette Bihari, MD;  Location: [facility];  Service: Cardiovascular;  Laterality: N/A;   THYROIDECTOMY, PARTIAL      Social History: Social History   Socioeconomic History   Marital status: Widowed    Spouse name: Not on file   Number of children: 7   Years of education: Not on file   Highest education level: Not on file  Occupational History    Employer: RETIRED  Tobacco Use   Smoking status: Never    Passive exposure: Yes   Smokeless tobacco: Never  Vaping Use   Vaping Use: Never used  Substance and Sexual Activity   Alcohol use: Never   Drug use: Never   Sexual activity: Not Currently  Other Topics Concern   Not on file  Social History Narrative   Daughter, Denzil Magnuson, lives with and assists patient.    Social Determinants of Health   Financial Resource Strain: Low Risk  (11/05/2020)  Overall Financial Resource Strain (CARDIA)    Difficulty of Paying Living Expenses: Not hard at all  Food Insecurity: No Food Insecurity (11/05/2020)   Hunger Vital Sign    Worried About Running Out of Food in the Last Year: Never true    Ran Out of Food in the Last Year: Never true  Transportation Needs: No Transportation Needs (11/05/2020)   PRAPARE - Administrator, Civil Service (Medical): No    Lack of Transportation (Non-Medical): No  Physical Activity: Sufficiently Active (11/05/2020)   Exercise Vital Sign    Days of Exercise per Week: 5 days    Minutes of Exercise per Session: 30 min  Stress: No Stress Concern Present (11/05/2020)   [employer] of Occupational Health - Occupational Stress Questionnaire    Feeling of Stress : Not at all  Social Connections: Moderately Integrated (11/05/2020)   Social Connection and Isolation Panel [NHANES]    Frequency of Communication with Friends and Family: More than three times a week    Frequency of Social Gatherings with Friends and Family: More than three  times a week    Attends Religious Services: 1 to 4 times per year    Active Member of [employer] or Organizations: Yes    Attends Banker Meetings: 1 to 4 times per year    Marital Status: Widowed  Intimate Partner Violence: Not At Risk (11/05/2020)   Humiliation, Afraid, Rape, and Kick questionnaire    Fear of Current or Ex-Partner: No    Emotionally Abused: No    Physically Abused: No    Sexually Abused: No    Family History: Family History  Problem Relation Age of Onset   Anemia Father    Arthritis Mother    Stroke Brother    Cancer Sister        unknown kind   Diabetes Sister    Dementia Sister    Cancer Niece        Breast   Cancer Brother    Healthy Son    Healthy Son    Healthy Son    Healthy Daughter    Healthy Daughter    Healthy Daughter    Sarcoidosis Daughter     Current Medications:  Current Outpatient Medications:    albuterol (VENTOLIN HFA) 108 (90 Base) MCG/ACT inhaler, Inhale 2 puffs into the lungs every 6 (six) hours as needed for wheezing or shortness of breath., Disp: 8 g, Rfl: 0   ASPIRIN 81 PO, Take 1 tablet by mouth daily., Disp: , Rfl:    azithromycin (ZITHROMAX) 250 MG tablet, Take 1 tablet (250 mg total) by mouth daily. Take first 2 tablets together, then 1 every day until finished., Disp: 6 tablet, Rfl: 0   blood glucose meter kit and supplies, Dispense based on patient and insurance preference. Use up to four times daily as directed. (FOR ICD-10 E10.9, E11.9)., Disp: 1 each, Rfl: 0   Blood Glucose Monitoring Suppl (BLOOD GLUCOSE SYSTEM PAK) KIT, Please dispense based on patient and insurance preference. Use as directed to monitor FSBS 2x daily. Dx: E11.9., Disp: 1 each, Rfl: 1   Calcium 500 MG tablet, Take 600 mg by mouth 2 (two) times daily., Disp: , Rfl:    dexamethasone (DECADRON) 4 MG tablet, Take 5 tablets (20 mg total) by mouth once a week., Disp: 20 tablet, Rfl: 5   fluticasone (FLONASE) 50 MCG/ACT nasal spray, Place 2 sprays into  both nostrils daily., Disp: 16 g, Rfl: 6  gabapentin (NEURONTIN) 300 MG capsule, TAKE ONE CAPSULE (300MG  TOTAL) BY MOUTH TWO TIMES DAILY, Disp: 90 capsule, Rfl: 1   glipiZIDE (GLUCOTROL) 5 MG tablet, TAKE ONE TABLET (5MG  TOTAL) BY MOUTH TWO TIMES DAILY BEFORE A MEAL, Disp: 90 tablet, Rfl: 1   Glucose Blood (BLOOD GLUCOSE TEST STRIPS) STRP, Please dispense based on patient and insurance preference. Use as directed to monitor FSBS 2x daily. Dx: E11.9., Disp: 100 each, Rfl: 11   guaiFENesin (MUCINEX) 600 MG 12 hr tablet, Take 1 tablet (600 mg total) by mouth 2 (two) times daily., Disp: 60 tablet, Rfl: 0   HM LORATADINE 10 MG tablet, TAKE ONE TABLET (10MG  TOTAL) BY MOUTH DAILY (Patient taking differently: Take 10 mg by mouth daily as needed for allergies.), Disp: 30 tablet, Rfl: 0   HYDROcodone-acetaminophen (NORCO/VICODIN) 5-325 MG tablet, TAKE ONE (1) TABLET BY MOUTH EVERY 12 HOURS AS NEEDED FOR MODERATE PAIN, Disp: 60 tablet, Rfl: 0   Lancets MISC, Please dispense based on patient and insurance preference. Use as directed to monitor FSBS 2x daily. Dx: E11.9., Disp: 100 each, Rfl: 11   lenalidomide (REVLIMID) 10 MG capsule, Take 1 capsule (10 mg total) by mouth daily. 14 days on, 7 days off, Disp: 14 capsule, Rfl: 0   levothyroxine (SYNTHROID) 137 MCG tablet, Take 1 tablet (137 mcg total) by mouth daily before breakfast., Disp: 90 tablet, Rfl: 1   losartan (COZAAR) 25 MG tablet, Take 1 tablet (25 mg total) by mouth daily., Disp: 90 tablet, Rfl: 1   magic mouthwash w/lidocaine SOLN, Take 5 mLs by mouth 4 (four) times daily., Disp: 450 mL, Rfl: 0   magnesium oxide (MAG-OX) 400 (240 Mg) MG tablet, Take 1 tablet (400 mg total) by mouth 3 (three) times daily., Disp: 90 tablet, Rfl: 6   meclizine (ANTIVERT) 25 MG tablet, Take 25 mg by mouth every 6 (six) hours as needed for dizziness., Disp: , Rfl:    metFORMIN (GLUCOPHAGE) 1000 MG tablet, Take 1 tablet (1,000 mg total) by mouth 2 (two) times daily., Disp: 60  tablet, Rfl: 3   metoprolol tartrate (LOPRESSOR) 25 MG tablet, TAKE ONE-HALF TABLET (12.5MG  TOTAL) BY MOUTH TWO TIMES DAILY, Disp: 90 tablet, Rfl: 3   nitrofurantoin, macrocrystal-monohydrate, (MACROBID) 100 MG capsule, Take 1 capsule (100 mg total) by mouth 2 (two) times daily., Disp: 20 capsule, Rfl: 0   nystatin (MYCOSTATIN) 100000 UNIT/ML suspension, Take 5 mLs (500,000 Units total) by mouth 4 (four) times daily., Disp: 60 mL, Rfl: 0   omeprazole (PRILOSEC) 40 MG capsule, TAKE ONE CAPSULE BY MOUTH DAILY, Disp: 90 capsule, Rfl: 3   polyethylene glycol (MIRALAX / GLYCOLAX) 17 g packet, Take 17 g by mouth daily., Disp: 14 each, Rfl: 0   potassium chloride (KLOR-CON M) 10 MEQ tablet, Take 2 tablets (20 mEq total) by mouth daily., Disp: 60 tablet, Rfl: 2   potassium chloride (KLOR-CON) 10 MEQ tablet, Take 20 mEq by mouth daily., Disp: , Rfl:    simvastatin (ZOCOR) 40 MG tablet, TAKE ONE TABLET (40MG  TOTAL) BY MOUTH BEDTIME, Disp: 90 tablet, Rfl: 3   traMADol (ULTRAM) 50 MG tablet, TAKE ONE TABLET (50MG  TOTAL) BY MOUTH EVERY SIX HOURS AS NEEDED, Disp: 120 tablet, Rfl: 0   zinc oxide (MEIJER ZINC OXIDE) 20 % ointment, Apply 1 application topically as needed for irritation., Disp: 56.7 g, Rfl: 0   Allergies: No Known Allergies  REVIEW OF SYSTEMS:   Review of Systems  Constitutional:  Negative for chills, fatigue and fever.  HENT:   Negative for lump/mass, mouth sores, nosebleeds, sore throat and trouble swallowing.   Eyes:  Negative for eye problems.  Respiratory:  Negative for cough and shortness of breath.   Cardiovascular:  Negative for chest pain, leg swelling and palpitations.  Gastrointestinal:  Negative for abdominal pain, constipation, diarrhea, nausea and vomiting.  Genitourinary:  Negative for bladder incontinence, difficulty urinating, dysuria, frequency, hematuria and nocturia.   Musculoskeletal:  Negative for arthralgias, back pain, flank pain, myalgias and neck pain.  Skin:   Negative for itching and rash.  Neurological:  Negative for dizziness, headaches and numbness.  Hematological:  Does not bruise/bleed easily.  Psychiatric/Behavioral:  Negative for depression, sleep disturbance and suicidal ideas. The patient is not nervous/anxious.   All other systems reviewed and are negative.    VITALS:   There were no vitals taken for this visit.  Wt Readings from Last 3 Encounters:  04/22/22 175 lb (79.4 kg)  03/01/22 166 lb (75.3 kg)  11/03/21 155 lb (70.3 kg)    There is no height or weight on file to calculate BMI.  Performance status (ECOG): {CHL ONC [id number]  PHYSICAL EXAM:   Physical Exam Vitals and nursing note reviewed. Exam conducted with a chaperone present.  Constitutional:      Appearance: Normal appearance.  Cardiovascular:     Rate and Rhythm: Normal rate and regular rhythm.     Pulses: Normal pulses.     Heart sounds: Normal heart sounds.  Pulmonary:     Effort: Pulmonary effort is normal.     Breath sounds: Normal breath sounds.  Abdominal:     Palpations: Abdomen is soft. There is no hepatomegaly, splenomegaly or mass.     Tenderness: There is no abdominal tenderness.  Musculoskeletal:     Right lower leg: No edema.     Left lower leg: No edema.  Lymphadenopathy:     Cervical: No cervical adenopathy.     Right cervical: No superficial, deep or posterior cervical adenopathy.    Left cervical: No superficial, deep or posterior cervical adenopathy.     Upper Body:     Right upper body: No supraclavicular or axillary adenopathy.     Left upper body: No supraclavicular or axillary adenopathy.  Neurological:     General: No focal deficit present.     Mental Status: She is alert and oriented to person, place, and time.  Psychiatric:        Mood and Affect: Mood normal.        Behavior: Behavior normal.     LABS:      Latest Ref Rng & Units 07/12/2022    8:25 AM 06/01/2022   11:09 AM 05/24/2022    8:28 AM  CBC  WBC 4.0 -  10.5 K/uL 6.6  6.9  6.1   Hemoglobin 12.0 - 15.0 g/dL 40.9  81.1  91.4   Hematocrit 36.0 - 46.0 % 32.2  36.4  38.6   Platelets 150 - 400 K/uL 107  200  186       Latest Ref Rng & Units 07/12/2022    8:25 AM 06/21/2022    1:22 PM 06/01/2022   11:09 AM  CMP  Glucose 70 - 99 mg/dL 782  956  213   BUN 8 - 23 mg/dL 21  13  20    Creatinine 0.44 - 1.00 mg/dL 0.86  5.78  4.69   Sodium 135 - 145 mmol/L 134  136  135   Potassium 3.5 -  5.1 mmol/L 3.8  3.9  4.6   Chloride 98 - 111 mmol/L 103  104  100   CO2 22 - 32 mmol/L 22  22  25    Calcium 8.9 - 10.3 mg/dL 8.0  8.5  9.1   Total Protein 6.5 - 8.1 g/dL 6.1  6.9  6.7   Total Bilirubin 0.3 - 1.2 mg/dL 1.2  0.9  0.8   Alkaline Phos 38 - 126 U/L 58  88  61   AST 15 - 41 U/L 14  18  22    ALT 0 - 44 U/L 13  17  19       No results found for: "CEA1", "CEA" / No results found for: "CEA1", "CEA" No results found for: "PSA1" No results found for: "ZOX096" No results found for: "CAN125"  Lab Results  Component Value Date   TOTALPROTELP 5.4 (L) 07/12/2022   ALBUMINELP 2.5 (L) 07/12/2022   A1GS 0.4 07/12/2022   A2GS 0.8 07/12/2022   BETS 1.0 07/12/2022   GAMS 0.6 07/12/2022   MSPIKE 0.3 (H) 07/12/2022   SPEI Comment 07/12/2022   Lab Results  Component Value Date   TIBC 297 11/12/2019   TIBC 275 09/04/2019   TIBC 269 08/21/2019   FERRITIN 256 11/12/2019   FERRITIN 128 09/04/2019   FERRITIN 131 08/21/2019   IRONPCTSAT 23 11/12/2019   IRONPCTSAT 24 09/04/2019   IRONPCTSAT 25 08/21/2019   Lab Results  Component Value Date   LDH 157 12/22/2021   LDH 158 10/28/2021   LDH 166 09/02/2021     STUDIES:   DG Chest 2 View  Result Date: 07/13/2022 CLINICAL DATA:  Cough, chills, shortness of breath EXAM: CHEST - 2 VIEW COMPARISON:  06/01/2022 FINDINGS: Low lung volumes. Heart and mediastinal contours within normal limits. Aortic atherosclerosis. Left basilar opacity again noted, similar to prior study. No confluent opacity on the right. No  effusions or acute bony abnormality. IMPRESSION: Low lung volumes. Left basilar atelectasis or infiltrate, similar to prior study. Electronically Signed   By: Charlett Nose M.D.   On: 07/13/2022 12:39

## 2022-07-19 ENCOUNTER — Inpatient Hospital Stay (HOSPITAL_BASED_OUTPATIENT_CLINIC_OR_DEPARTMENT_OTHER): Payer: Medicare Other | Admitting: Hematology

## 2022-07-19 ENCOUNTER — Other Ambulatory Visit: Payer: Self-pay | Admitting: *Deleted

## 2022-07-19 ENCOUNTER — Encounter: Payer: Self-pay | Admitting: *Deleted

## 2022-07-19 ENCOUNTER — Inpatient Hospital Stay: Payer: Medicare Other

## 2022-07-19 VITALS — BP 117/70 | HR 80 | Temp 98.1°F | Resp 17

## 2022-07-19 DIAGNOSIS — C9000 Multiple myeloma not having achieved remission: Secondary | ICD-10-CM | POA: Diagnosis not present

## 2022-07-19 DIAGNOSIS — B3781 Candidal esophagitis: Secondary | ICD-10-CM

## 2022-07-19 DIAGNOSIS — R61 Generalized hyperhidrosis: Secondary | ICD-10-CM | POA: Diagnosis not present

## 2022-07-19 DIAGNOSIS — N390 Urinary tract infection, site not specified: Secondary | ICD-10-CM | POA: Diagnosis not present

## 2022-07-19 DIAGNOSIS — R251 Tremor, unspecified: Secondary | ICD-10-CM | POA: Diagnosis not present

## 2022-07-19 DIAGNOSIS — Z79899 Other long term (current) drug therapy: Secondary | ICD-10-CM | POA: Diagnosis not present

## 2022-07-19 DIAGNOSIS — R051 Acute cough: Secondary | ICD-10-CM | POA: Diagnosis not present

## 2022-07-19 MED ORDER — MEGESTROL ACETATE 400 MG/10ML PO SUSP: 400.0000 mg | Freq: Two times a day (BID) | ORAL | 1 refills | Status: DC

## 2022-07-19 MED ORDER — FLUCONAZOLE 100 MG PO TABS: 100.0000 mg | ORAL_TABLET | Freq: Every day | ORAL | 0 refills | Status: DC

## 2022-07-19 MED ORDER — CLOTRIMAZOLE 10 MG MT TROC: 10.0000 mg | Freq: Every day | OROMUCOSAL | 0 refills | Status: DC

## 2022-07-19 MED ORDER — NYSTATIN 100000 UNIT/ML MT SUSP
5.0000 mL | Freq: Four times a day (QID) | OROMUCOSAL | 0 refills | Status: DC
Start: 2022-07-19 — End: 2022-08-03

## 2022-07-19 NOTE — Patient Instructions (Addendum)
[facility] at [facility] Discharge Instructions   You were seen and examined today by Dr. Ellin Saba.  He reviewed the results of your lab work which are normal/stable.   Hold Revlimid and dexamethasone (steroid).   We sent a prescription to your pharmacy for Nystatin mouthwash and also a pill called Diflucan. This is to help treat your thrush. These were sent to [facility].   We sent a prescription to your pharmacy to help improve your appetite. It is called Megace. This is a liquid that you will take 2 teaspoons twice a day.   Return as scheduled.    Thank you for choosing [facility] at [facility] to provide your oncology and hematology care.  To afford each patient quality time with our provider, please arrive at least 15 minutes before your scheduled appointment time.   If you have a lab appointment with the Cancer Center please come in thru the Main Entrance and check in at the main information desk.  You need to re-schedule your appointment should you arrive 10 or more minutes late.  We strive to give you quality time with our providers, and arriving late affects you and other patients whose appointments are after yours.  Also, if you no show three or more times for appointments you may be dismissed from the clinic at the providers discretion.     Again, thank you for choosing [facility].  Our hope is that these requests will decrease the amount of time that you wait before being seen by our physicians.       _____________________________________________________________  Should you have questions after your visit to [facility], please contact our office at [phone] and follow the prompts.  Our office hours are 8:00 a.m. and 4:30 p.m. Monday - Friday.  Please note that voicemails left after 4:00 p.m. may not be returned until the following business day.  We are closed weekends and major holidays.   You do have access to a nurse 24-7, just call the main number to the clinic [phone] and do not press any options, hold on the line and a nurse will answer the phone.    For prescription refill requests, have your pharmacy contact our office and allow 72 hours.    Due to Covid, you will need to wear a mask upon entering the hospital. If you do not have a mask, a mask will be given to you at the Main Entrance upon arrival. For doctor visits, patients may have 1 support person age 77 or older with them. For treatment visits, patients can not have anyone with them due to social distancing guidelines and our immunocompromised population.

## 2022-07-19 NOTE — Progress Notes (Signed)
Received approval from plan for Megace 40mg /ml from 07/19/22-07/19/23.  Pharmacy made aware.

## 2022-07-19 NOTE — Progress Notes (Signed)
Hold Xgeva today per Dr. Ellin Saba, return as scheduled.

## 2022-07-20 ENCOUNTER — Other Ambulatory Visit: Payer: Self-pay

## 2022-07-21 DIAGNOSIS — E1136 Type 2 diabetes mellitus with diabetic cataract: Secondary | ICD-10-CM | POA: Diagnosis not present

## 2022-07-21 DIAGNOSIS — D630 Anemia in neoplastic disease: Secondary | ICD-10-CM | POA: Diagnosis not present

## 2022-07-21 DIAGNOSIS — I10 Essential (primary) hypertension: Secondary | ICD-10-CM | POA: Diagnosis not present

## 2022-07-21 DIAGNOSIS — L89322 Pressure ulcer of left buttock, stage 2: Secondary | ICD-10-CM | POA: Diagnosis not present

## 2022-07-21 DIAGNOSIS — L89312 Pressure ulcer of right buttock, stage 2: Secondary | ICD-10-CM | POA: Diagnosis not present

## 2022-07-21 DIAGNOSIS — C9000 Multiple myeloma not having achieved remission: Secondary | ICD-10-CM | POA: Diagnosis not present

## 2022-07-23 ENCOUNTER — Other Ambulatory Visit: Payer: Self-pay | Admitting: Hematology

## 2022-07-23 DIAGNOSIS — L89322 Pressure ulcer of left buttock, stage 2: Secondary | ICD-10-CM | POA: Diagnosis not present

## 2022-07-23 DIAGNOSIS — C9000 Multiple myeloma not having achieved remission: Secondary | ICD-10-CM | POA: Diagnosis not present

## 2022-07-23 DIAGNOSIS — D630 Anemia in neoplastic disease: Secondary | ICD-10-CM | POA: Diagnosis not present

## 2022-07-23 DIAGNOSIS — L89312 Pressure ulcer of right buttock, stage 2: Secondary | ICD-10-CM | POA: Diagnosis not present

## 2022-07-23 DIAGNOSIS — I10 Essential (primary) hypertension: Secondary | ICD-10-CM | POA: Diagnosis not present

## 2022-07-23 DIAGNOSIS — E1136 Type 2 diabetes mellitus with diabetic cataract: Secondary | ICD-10-CM | POA: Diagnosis not present

## 2022-07-26 ENCOUNTER — Emergency Department (HOSPITAL_COMMUNITY): Payer: Medicare Other

## 2022-07-26 ENCOUNTER — Other Ambulatory Visit: Payer: Self-pay

## 2022-07-26 ENCOUNTER — Inpatient Hospital Stay (HOSPITAL_COMMUNITY)
Admission: EM | Admit: 2022-07-26 | Discharge: 2022-08-03 | DRG: 064 | Disposition: A | Discharge status: Hospice | Payer: Medicare Other | Attending: Internal Medicine | Admitting: Internal Medicine

## 2022-07-26 ENCOUNTER — Observation Stay (HOSPITAL_COMMUNITY): Payer: Medicare Other

## 2022-07-26 ENCOUNTER — Encounter (HOSPITAL_COMMUNITY): Payer: Self-pay

## 2022-07-26 ENCOUNTER — Encounter (HOSPITAL_COMMUNITY): Payer: Self-pay | Admitting: Hematology

## 2022-07-26 DIAGNOSIS — E440 Moderate protein-calorie malnutrition: Secondary | ICD-10-CM | POA: Diagnosis present

## 2022-07-26 DIAGNOSIS — I10 Essential (primary) hypertension: Secondary | ICD-10-CM | POA: Diagnosis present

## 2022-07-26 DIAGNOSIS — C9000 Multiple myeloma not having achieved remission: Secondary | ICD-10-CM | POA: Diagnosis present

## 2022-07-26 DIAGNOSIS — Z7189 Other specified counseling: Secondary | ICD-10-CM | POA: Diagnosis not present

## 2022-07-26 DIAGNOSIS — D696 Thrombocytopenia, unspecified: Secondary | ICD-10-CM | POA: Diagnosis not present

## 2022-07-26 DIAGNOSIS — G40909 Epilepsy, unspecified, not intractable, without status epilepticus: Secondary | ICD-10-CM | POA: Diagnosis present

## 2022-07-26 DIAGNOSIS — E1122 Type 2 diabetes mellitus with diabetic chronic kidney disease: Secondary | ICD-10-CM | POA: Diagnosis present

## 2022-07-26 DIAGNOSIS — E161 Other hypoglycemia: Secondary | ICD-10-CM | POA: Diagnosis not present

## 2022-07-26 DIAGNOSIS — Z833 Family history of diabetes mellitus: Secondary | ICD-10-CM

## 2022-07-26 DIAGNOSIS — G9341 Metabolic encephalopathy: Secondary | ICD-10-CM | POA: Diagnosis not present

## 2022-07-26 DIAGNOSIS — B9620 Unspecified Escherichia coli [E. coli] as the cause of diseases classified elsewhere: Secondary | ICD-10-CM | POA: Diagnosis present

## 2022-07-26 DIAGNOSIS — R5381 Other malaise: Secondary | ICD-10-CM | POA: Diagnosis present

## 2022-07-26 DIAGNOSIS — E039 Hypothyroidism, unspecified: Secondary | ICD-10-CM | POA: Diagnosis present

## 2022-07-26 DIAGNOSIS — R29712 NIHSS score 12: Secondary | ICD-10-CM | POA: Diagnosis present

## 2022-07-26 DIAGNOSIS — Z7401 Bed confinement status: Secondary | ICD-10-CM

## 2022-07-26 DIAGNOSIS — M179 Osteoarthritis of knee, unspecified: Secondary | ICD-10-CM | POA: Diagnosis present

## 2022-07-26 DIAGNOSIS — J189 Pneumonia, unspecified organism: Secondary | ICD-10-CM | POA: Diagnosis present

## 2022-07-26 DIAGNOSIS — D472 Monoclonal gammopathy: Secondary | ICD-10-CM | POA: Diagnosis present

## 2022-07-26 DIAGNOSIS — E785 Hyperlipidemia, unspecified: Secondary | ICD-10-CM | POA: Diagnosis present

## 2022-07-26 DIAGNOSIS — I2510 Atherosclerotic heart disease of native coronary artery without angina pectoris: Secondary | ICD-10-CM | POA: Diagnosis present

## 2022-07-26 DIAGNOSIS — D630 Anemia in neoplastic disease: Secondary | ICD-10-CM | POA: Diagnosis present

## 2022-07-26 DIAGNOSIS — Z7961 Long term (current) use of immunomodulator: Secondary | ICD-10-CM

## 2022-07-26 DIAGNOSIS — I129 Hypertensive chronic kidney disease with stage 1 through stage 4 chronic kidney disease, or unspecified chronic kidney disease: Secondary | ICD-10-CM | POA: Diagnosis present

## 2022-07-26 DIAGNOSIS — R9431 Abnormal electrocardiogram [ECG] [EKG]: Secondary | ICD-10-CM | POA: Diagnosis not present

## 2022-07-26 DIAGNOSIS — L89153 Pressure ulcer of sacral region, stage 3: Secondary | ICD-10-CM | POA: Diagnosis present

## 2022-07-26 DIAGNOSIS — E1140 Type 2 diabetes mellitus with diabetic neuropathy, unspecified: Secondary | ICD-10-CM | POA: Diagnosis present

## 2022-07-26 DIAGNOSIS — E871 Hypo-osmolality and hyponatremia: Secondary | ICD-10-CM | POA: Diagnosis not present

## 2022-07-26 DIAGNOSIS — K219 Gastro-esophageal reflux disease without esophagitis: Secondary | ICD-10-CM | POA: Diagnosis present

## 2022-07-26 DIAGNOSIS — L8990 Pressure ulcer of unspecified site, unspecified stage: Secondary | ICD-10-CM | POA: Insufficient documentation

## 2022-07-26 DIAGNOSIS — Z993 Dependence on wheelchair: Secondary | ICD-10-CM

## 2022-07-26 DIAGNOSIS — Z820 Family history of epilepsy and other diseases of the nervous system: Secondary | ICD-10-CM

## 2022-07-26 DIAGNOSIS — D649 Anemia, unspecified: Secondary | ICD-10-CM | POA: Diagnosis present

## 2022-07-26 DIAGNOSIS — E11649 Type 2 diabetes mellitus with hypoglycemia without coma: Secondary | ICD-10-CM | POA: Diagnosis present

## 2022-07-26 DIAGNOSIS — E162 Hypoglycemia, unspecified: Principal | ICD-10-CM | POA: Insufficient documentation

## 2022-07-26 DIAGNOSIS — Z8261 Family history of arthritis: Secondary | ICD-10-CM

## 2022-07-26 DIAGNOSIS — R55 Syncope and collapse: Secondary | ICD-10-CM | POA: Diagnosis not present

## 2022-07-26 DIAGNOSIS — K5900 Constipation, unspecified: Secondary | ICD-10-CM | POA: Diagnosis present

## 2022-07-26 DIAGNOSIS — Z7984 Long term (current) use of oral hypoglycemic drugs: Secondary | ICD-10-CM

## 2022-07-26 DIAGNOSIS — R569 Unspecified convulsions: Secondary | ICD-10-CM | POA: Diagnosis not present

## 2022-07-26 DIAGNOSIS — I959 Hypotension, unspecified: Secondary | ICD-10-CM | POA: Diagnosis present

## 2022-07-26 DIAGNOSIS — Z66 Do not resuscitate: Secondary | ICD-10-CM | POA: Diagnosis present

## 2022-07-26 DIAGNOSIS — E860 Dehydration: Secondary | ICD-10-CM | POA: Diagnosis present

## 2022-07-26 DIAGNOSIS — I6389 Other cerebral infarction: Secondary | ICD-10-CM | POA: Diagnosis not present

## 2022-07-26 DIAGNOSIS — Z515 Encounter for palliative care: Secondary | ICD-10-CM

## 2022-07-26 DIAGNOSIS — Z79899 Other long term (current) drug therapy: Secondary | ICD-10-CM

## 2022-07-26 DIAGNOSIS — E8809 Other disorders of plasma-protein metabolism, not elsewhere classified: Secondary | ICD-10-CM | POA: Diagnosis present

## 2022-07-26 DIAGNOSIS — R4182 Altered mental status, unspecified: Secondary | ICD-10-CM

## 2022-07-26 DIAGNOSIS — R29818 Other symptoms and signs involving the nervous system: Secondary | ICD-10-CM | POA: Diagnosis not present

## 2022-07-26 DIAGNOSIS — F0390 Unspecified dementia without behavioral disturbance: Secondary | ICD-10-CM | POA: Diagnosis present

## 2022-07-26 DIAGNOSIS — R52 Pain, unspecified: Secondary | ICD-10-CM | POA: Diagnosis not present

## 2022-07-26 DIAGNOSIS — Z6830 Body mass index (BMI) 30.0-30.9, adult: Secondary | ICD-10-CM

## 2022-07-26 DIAGNOSIS — Z7982 Long term (current) use of aspirin: Secondary | ICD-10-CM

## 2022-07-26 DIAGNOSIS — R1310 Dysphagia, unspecified: Secondary | ICD-10-CM | POA: Diagnosis present

## 2022-07-26 DIAGNOSIS — Z809 Family history of malignant neoplasm, unspecified: Secondary | ICD-10-CM

## 2022-07-26 DIAGNOSIS — I63233 Cerebral infarction due to unspecified occlusion or stenosis of bilateral carotid arteries: Secondary | ICD-10-CM | POA: Diagnosis not present

## 2022-07-26 DIAGNOSIS — N390 Urinary tract infection, site not specified: Secondary | ICD-10-CM | POA: Diagnosis present

## 2022-07-26 DIAGNOSIS — Z803 Family history of malignant neoplasm of breast: Secondary | ICD-10-CM

## 2022-07-26 DIAGNOSIS — E1165 Type 2 diabetes mellitus with hyperglycemia: Secondary | ICD-10-CM | POA: Diagnosis present

## 2022-07-26 DIAGNOSIS — R627 Adult failure to thrive: Secondary | ICD-10-CM | POA: Diagnosis present

## 2022-07-26 DIAGNOSIS — Z79890 Hormone replacement therapy (postmenopausal): Secondary | ICD-10-CM

## 2022-07-26 DIAGNOSIS — I63543 Cerebral infarction due to unspecified occlusion or stenosis of bilateral cerebellar arteries: Secondary | ICD-10-CM | POA: Diagnosis not present

## 2022-07-26 DIAGNOSIS — R918 Other nonspecific abnormal finding of lung field: Secondary | ICD-10-CM | POA: Diagnosis not present

## 2022-07-26 DIAGNOSIS — N1831 Chronic kidney disease, stage 3a: Secondary | ICD-10-CM | POA: Diagnosis present

## 2022-07-26 DIAGNOSIS — Z4659 Encounter for fitting and adjustment of other gastrointestinal appliance and device: Secondary | ICD-10-CM | POA: Diagnosis not present

## 2022-07-26 DIAGNOSIS — R404 Transient alteration of awareness: Secondary | ICD-10-CM | POA: Diagnosis not present

## 2022-07-26 DIAGNOSIS — R001 Bradycardia, unspecified: Secondary | ICD-10-CM | POA: Diagnosis not present

## 2022-07-26 DIAGNOSIS — M1990 Unspecified osteoarthritis, unspecified site: Secondary | ICD-10-CM | POA: Diagnosis present

## 2022-07-26 DIAGNOSIS — E669 Obesity, unspecified: Secondary | ICD-10-CM | POA: Diagnosis present

## 2022-07-26 DIAGNOSIS — Z823 Family history of stroke: Secondary | ICD-10-CM

## 2022-07-26 DIAGNOSIS — H409 Unspecified glaucoma: Secondary | ICD-10-CM | POA: Diagnosis present

## 2022-07-26 LAB — COMPREHENSIVE METABOLIC PANEL
ALT: 31 U/L (ref 0–44)
AST: 34 U/L (ref 15–41)
Albumin: 2.5 g/dL — ABNORMAL LOW (ref 3.5–5.0)
Alkaline Phosphatase: 88 U/L (ref 38–126)
Anion gap: 10 (ref 5–15)
BUN: 32 mg/dL — ABNORMAL HIGH (ref 8–23)
CO2: 17 mmol/L — ABNORMAL LOW (ref 22–32)
Calcium: 7.8 mg/dL — ABNORMAL LOW (ref 8.9–10.3)
Chloride: 108 mmol/L (ref 98–111)
Creatinine, Ser: 1.19 mg/dL — ABNORMAL HIGH (ref 0.44–1.00)
GFR, Estimated: 47 mL/min — ABNORMAL LOW (ref >60)
Glucose, Bld: 61 mg/dL — ABNORMAL LOW (ref 70–99)
Potassium: 5 mmol/L (ref 3.5–5.1)
Sodium: 135 mmol/L (ref 135–145)
Total Bilirubin: 0.8 mg/dL (ref 0.3–1.2)
Total Protein: 6.5 g/dL (ref 6.5–8.1)

## 2022-07-26 LAB — DIFFERENTIAL
Abs Immature Granulocytes: 0.07 10*3/uL (ref 0.00–0.07)
Basophils Absolute: 0 10*3/uL (ref 0.0–0.1)
Basophils Relative: 1 %
Eosinophils Absolute: 0.1 10*3/uL (ref 0.0–0.5)
Eosinophils Relative: 1 %
Immature Granulocytes: 1 %
Lymphocytes Relative: 28 %
Lymphs Abs: 1.5 10*3/uL (ref 0.7–4.0)
Monocytes Absolute: 0.4 10*3/uL (ref 0.1–1.0)
Monocytes Relative: 7 %
Neutro Abs: 3.3 10*3/uL (ref 1.7–7.7)
Neutrophils Relative %: 62 %

## 2022-07-26 LAB — GLUCOSE, CAPILLARY: Glucose-Capillary: 104 mg/dL — ABNORMAL HIGH (ref 70–99)

## 2022-07-26 LAB — LIPID PANEL
Cholesterol: 107 mg/dL (ref 0–200)
HDL: 31 mg/dL — ABNORMAL LOW (ref >40)
LDL Cholesterol: 47 mg/dL (ref 0–99)
Total CHOL/HDL Ratio: 3.5 RATIO
Triglycerides: 143 mg/dL (ref <150)
VLDL: 29 mg/dL (ref 0–40)

## 2022-07-26 LAB — APTT: aPTT: 26 seconds (ref 24–36)

## 2022-07-26 LAB — RAPID URINE DRUG SCREEN, HOSP PERFORMED
Amphetamines: NOT DETECTED
Barbiturates: NOT DETECTED
Benzodiazepines: NOT DETECTED
Cocaine: NOT DETECTED
Opiates: POSITIVE — AB
Tetrahydrocannabinol: NOT DETECTED

## 2022-07-26 LAB — AMMONIA: Ammonia: 21 umol/L (ref 9–35)

## 2022-07-26 LAB — HEMOGLOBIN A1C
Hgb A1c MFr Bld: 6.4 % — ABNORMAL HIGH (ref 4.8–5.6)
Mean Plasma Glucose: 136.98 mg/dL

## 2022-07-26 LAB — CK: Total CK: 413 U/L — ABNORMAL HIGH (ref 38–234)

## 2022-07-26 LAB — URINALYSIS, ROUTINE W REFLEX MICROSCOPIC
Bilirubin Urine: NEGATIVE
Glucose, UA: NEGATIVE mg/dL
Ketones, ur: NEGATIVE mg/dL
Nitrite: NEGATIVE
Protein, ur: 100 mg/dL — AB
RBC / HPF: >50 RBC/hpf (ref 0–5)
Specific Gravity, Urine: 1.012 (ref 1.005–1.030)
WBC, UA: >50 WBC/hpf (ref 0–5)
pH: 5 (ref 5.0–8.0)

## 2022-07-26 LAB — I-STAT CHEM 8, ED
BUN: 45 mg/dL — ABNORMAL HIGH (ref 8–23)
Calcium, Ion: 0.99 mmol/L — ABNORMAL LOW (ref 1.15–1.40)
Chloride: 110 mmol/L (ref 98–111)
Creatinine, Ser: 1.2 mg/dL — ABNORMAL HIGH (ref 0.44–1.00)
Glucose, Bld: 102 mg/dL — ABNORMAL HIGH (ref 70–99)
HCT: 30 % — ABNORMAL LOW (ref 36.0–46.0)
Hemoglobin: 10.2 g/dL — ABNORMAL LOW (ref 12.0–15.0)
Potassium: 6.5 mmol/L (ref 3.5–5.1)
Sodium: 134 mmol/L — ABNORMAL LOW (ref 135–145)
TCO2: 20 mmol/L — ABNORMAL LOW (ref 22–32)

## 2022-07-26 LAB — BLOOD GAS, VENOUS
Acid-base deficit: 5.7 mmol/L — ABNORMAL HIGH (ref 0.0–2.0)
Bicarbonate: 20.7 mmol/L (ref 20.0–28.0)
Drawn by: 6246
O2 Saturation: 39.5 %
Patient temperature: 36.5
pCO2, Ven: 42 mmHg — ABNORMAL LOW (ref 44–60)
pH, Ven: 7.3 (ref 7.25–7.43)
pO2, Ven: <31 mmHg — CL (ref 32–45)

## 2022-07-26 LAB — TROPONIN I (HIGH SENSITIVITY)
Troponin I (High Sensitivity): 43 ng/L — ABNORMAL HIGH (ref <18)
Troponin I (High Sensitivity): 43 ng/L — ABNORMAL HIGH (ref <18)

## 2022-07-26 LAB — CBC
HCT: 31.3 % — ABNORMAL LOW (ref 36.0–46.0)
Hemoglobin: 9.8 g/dL — ABNORMAL LOW (ref 12.0–15.0)
MCH: 30 pg (ref 26.0–34.0)
MCHC: 31.3 g/dL (ref 30.0–36.0)
MCV: 95.7 fL (ref 80.0–100.0)
Platelets: 117 10*3/uL — ABNORMAL LOW (ref 150–400)
RBC: 3.27 MIL/uL — ABNORMAL LOW (ref 3.87–5.11)
RDW: 16.5 % — ABNORMAL HIGH (ref 11.5–15.5)
WBC: 5.3 10*3/uL (ref 4.0–10.5)
nRBC: 0 % (ref 0.0–0.2)

## 2022-07-26 LAB — CBG MONITORING, ED
Glucose-Capillary: 94 mg/dL (ref 70–99)
Glucose-Capillary: 20 mg/dL — CL (ref 70–99)
Glucose-Capillary: 103 mg/dL — ABNORMAL HIGH (ref 70–99)
Glucose-Capillary: 132 mg/dL — ABNORMAL HIGH (ref 70–99)
Glucose-Capillary: 110 mg/dL — ABNORMAL HIGH (ref 70–99)

## 2022-07-26 LAB — LACTIC ACID, PLASMA
Lactic Acid, Venous: 3.3 mmol/L (ref 0.5–1.9)
Lactic Acid, Venous: 2.9 mmol/L (ref 0.5–1.9)

## 2022-07-26 LAB — VITAMIN B12: Vitamin B-12: 368 pg/mL (ref 180–914)

## 2022-07-26 LAB — ETHANOL: Alcohol, Ethyl (B): <10 mg/dL (ref <10)

## 2022-07-26 LAB — PROTIME-INR
INR: 1.2 (ref 0.8–1.2)
Prothrombin Time: 15 seconds (ref 11.4–15.2)

## 2022-07-26 MED ORDER — DEXTROSE 50 % IV SOLN: 1.0000 | Freq: Once | INTRAVENOUS | Status: AC

## 2022-07-26 MED ORDER — ONDANSETRON HCL 4 MG PO TABS: 4.0000 mg | ORAL_TABLET | Freq: Four times a day (QID) | ORAL | Status: DC | PRN

## 2022-07-26 MED ORDER — HEPARIN SODIUM (PORCINE) 5000 UNIT/ML IJ SOLN
5000.0000 [IU] | Freq: Three times a day (TID) | INTRAMUSCULAR | Status: DC
  Administered 2022-07-26 – 2022-08-02 (×21): 5000 [IU] via SUBCUTANEOUS
  Filled 2022-07-26 (×20): qty 1

## 2022-07-26 MED ORDER — MEGESTROL ACETATE 400 MG/10ML PO SUSP
400.0000 mg | Freq: Two times a day (BID) | ORAL | Status: DC
  Filled 2022-07-26 (×2): qty 10

## 2022-07-26 MED ORDER — SODIUM CHLORIDE 0.9% FLUSH: 3.0000 mL | INTRAVENOUS | Status: DC | PRN

## 2022-07-26 MED ORDER — DEXTROSE 10 % IV SOLN: INTRAVENOUS | Status: DC

## 2022-07-26 MED ORDER — FLUCONAZOLE 200 MG PO TABS
200.0000 mg | ORAL_TABLET | Freq: Every day | ORAL | Status: DC
  Filled 2022-07-26 (×2): qty 1

## 2022-07-26 MED ORDER — GABAPENTIN 300 MG PO CAPS: 300.0000 mg | ORAL_CAPSULE | Freq: Two times a day (BID) | ORAL | Status: DC

## 2022-07-26 MED ORDER — SODIUM CHLORIDE 0.9 % IV SOLN: INTRAVENOUS | Status: DC | PRN

## 2022-07-26 MED ORDER — ACETAMINOPHEN 325 MG PO TABS
650.0000 mg | ORAL_TABLET | Freq: Four times a day (QID) | ORAL | Status: DC | PRN
  Administered 2022-07-28 – 2022-07-30 (×4): 650 mg via ORAL
  Filled 2022-07-26 (×4): qty 2

## 2022-07-26 MED ORDER — DEXTROSE 10 % IV SOLN: Freq: Once | INTRAVENOUS | Status: AC

## 2022-07-26 MED ORDER — ASPIRIN 81 MG PO TBEC: 81.0000 mg | DELAYED_RELEASE_TABLET | Freq: Every day | ORAL | Status: DC

## 2022-07-26 MED ORDER — BISACODYL 10 MG RE SUPP: 10.0000 mg | Freq: Every day | RECTAL | Status: DC | PRN

## 2022-07-26 MED ORDER — PANTOPRAZOLE SODIUM 40 MG PO TBEC: 40.0000 mg | DELAYED_RELEASE_TABLET | Freq: Every day | ORAL | Status: DC

## 2022-07-26 MED ORDER — LACTATED RINGERS IV BOLUS
1000.0000 mL | Freq: Once | INTRAVENOUS | Status: AC
  Administered 2022-07-26: 1000 mL via INTRAVENOUS

## 2022-07-26 MED ORDER — ACETAMINOPHEN 650 MG RE SUPP: 650.0000 mg | Freq: Four times a day (QID) | RECTAL | Status: DC | PRN

## 2022-07-26 MED ORDER — SODIUM CHLORIDE 0.9% FLUSH
3.0000 mL | Freq: Two times a day (BID) | INTRAVENOUS | Status: DC
  Administered 2022-07-26 – 2022-08-03 (×11): 3 mL via INTRAVENOUS

## 2022-07-26 MED ORDER — INSULIN ASPART 100 UNIT/ML IJ SOLN
0.0000 [IU] | Freq: Three times a day (TID) | INTRAMUSCULAR | Status: DC
  Administered 2022-07-27: 2 [IU] via SUBCUTANEOUS

## 2022-07-26 MED ORDER — SODIUM CHLORIDE 0.9 % IV SOLN
1.0000 g | INTRAVENOUS | Status: DC
  Administered 2022-07-26: 1 g via INTRAVENOUS
  Filled 2022-07-26: qty 10

## 2022-07-26 MED ORDER — SODIUM CHLORIDE 0.9 % IV SOLN
1.0000 g | Freq: Once | INTRAVENOUS | Status: AC
  Administered 2022-07-26: 1 g via INTRAVENOUS
  Filled 2022-07-26: qty 10

## 2022-07-26 MED ORDER — ONDANSETRON HCL 4 MG/2ML IJ SOLN: 4.0000 mg | Freq: Four times a day (QID) | INTRAMUSCULAR | Status: DC | PRN

## 2022-07-26 MED ORDER — TRAZODONE HCL 50 MG PO TABS: 50.0000 mg | ORAL_TABLET | Freq: Every evening | ORAL | Status: DC | PRN

## 2022-07-26 MED ORDER — SIMVASTATIN 20 MG PO TABS: 40.0000 mg | ORAL_TABLET | Freq: Every day | ORAL | Status: DC

## 2022-07-26 MED ORDER — DEXTROSE 50 % IV SOLN
INTRAVENOUS | Status: AC
  Administered 2022-07-26: 50 mL via INTRAVENOUS
  Filled 2022-07-26: qty 50

## 2022-07-26 MED ORDER — GADOBUTROL 1 MMOL/ML IV SOLN
7.0000 mL | Freq: Once | INTRAVENOUS | Status: AC | PRN
  Administered 2022-07-26: 7 mL via INTRAVENOUS

## 2022-07-26 MED ORDER — SODIUM CHLORIDE 0.9 % IV SOLN
500.0000 mg | Freq: Once | INTRAVENOUS | Status: AC
  Administered 2022-07-26: 500 mg via INTRAVENOUS
  Filled 2022-07-26: qty 5

## 2022-07-26 MED ORDER — ALBUTEROL SULFATE (2.5 MG/3ML) 0.083% IN NEBU: 2.5000 mg | INHALATION_SOLUTION | RESPIRATORY_TRACT | Status: DC | PRN

## 2022-07-26 MED ORDER — HYDRALAZINE HCL 20 MG/ML IJ SOLN
10.0000 mg | Freq: Four times a day (QID) | INTRAMUSCULAR | Status: DC | PRN
  Filled 2022-07-26: qty 1

## 2022-07-26 MED ORDER — HYDROCODONE-ACETAMINOPHEN 5-325 MG PO TABS: 1.0000 | ORAL_TABLET | Freq: Four times a day (QID) | ORAL | Status: DC | PRN

## 2022-07-26 MED ORDER — MECLIZINE HCL 25 MG PO TABS: 25.0000 mg | ORAL_TABLET | Freq: Four times a day (QID) | ORAL | Status: DC | PRN

## 2022-07-26 MED ORDER — LEVOTHYROXINE SODIUM 25 MCG PO TABS
137.0000 ug | ORAL_TABLET | Freq: Every day | ORAL | Status: DC
  Administered 2022-07-29 – 2022-07-31 (×3): 137 ug via ORAL
  Filled 2022-07-26 (×3): qty 1

## 2022-07-26 MED ORDER — POLYETHYLENE GLYCOL 3350 17 G PO PACK: 17.0000 g | PACK | Freq: Every day | ORAL | Status: DC

## 2022-07-26 MED ORDER — SODIUM CHLORIDE 0.9% FLUSH
3.0000 mL | Freq: Two times a day (BID) | INTRAVENOUS | Status: DC
  Administered 2022-07-26 – 2022-08-02 (×8): 3 mL via INTRAVENOUS

## 2022-07-26 MED ORDER — POLYETHYLENE GLYCOL 3350 17 G PO PACK: 17.0000 g | PACK | Freq: Every day | ORAL | Status: DC | PRN

## 2022-07-26 MED ORDER — SODIUM CHLORIDE 0.9 % IV SOLN
500.0000 mg | INTRAVENOUS | Status: DC
  Administered 2022-07-26 – 2022-08-01 (×7): 500 mg via INTRAVENOUS
  Filled 2022-07-26 (×9): qty 5

## 2022-07-26 NOTE — ED Notes (Signed)
Daughter at bedside.

## 2022-07-26 NOTE — ED Provider Notes (Signed)
[place] EMERGENCY DEPARTMENT AT [facility] Provider Note   CSN: [id number] Arrival date & time: 07/26/22  2130  An emergency department physician performed an initial assessment on this suspected stroke patient at 0719.  History  Chief Complaint  Patient presents with   Code Stroke    Kim Mills is a 82 y.o. female.  HPI Patient presents for altered mental status.  Medical history includes T2DM, arthritis, HTN, HLD, GERD, CAD, neuropathy, CKD, multiple myeloma.  She arrives from home via EMS.  She was reportedly in her normal state of health last night.  This morning, she was difficult to awaken.  When EMS arrived on scene, they noted decreased responsiveness and right-sided gaze.  Initial CBG was 30.  She received D10 through IV, placed by EMS.  Blood sugar normalized but patient remained altered.  History per daughter: Patient recently completed antibiotics for treatment of UTI.  While on the antibiotics, she would have decreased responsiveness.  This was discussed with her oncologist who feels like there may have been an interaction with her chemotherapy that she receives for her multiple myeloma.  Yesterday, patient had a reasonably good day and was talking.  She endorsed constipation and asked for a stool softener.  She was given MiraLAX and subsequently had multiple episodes of watery diarrhea.  Today, she has been minimally responsive and not talking.    Home Medications Prior to Admission medications   Medication Sig Start Date End Date Taking? Authorizing Provider  clotrimazole (MYCELEX) 10 MG troche Take 1 tablet (10 mg total) by mouth 5 (five) times daily. 07/19/22   Doreatha Massed, MD  albuterol (VENTOLIN HFA) 108 (90 Base) MCG/ACT inhaler Inhale 2 puffs into the lungs every 6 (six) hours as needed for wheezing or shortness of breath. 01/14/21   [name], MD  ASPIRIN 81 PO Take 1 tablet by mouth daily.    [provider]  blood glucose  meter kit and supplies Dispense based on patient and insurance preference. Use up to four times daily as directed. (FOR ICD-10 E10.9, E11.9). 10/23/20   [name], MD  Blood Glucose Monitoring Suppl (BLOOD GLUCOSE SYSTEM PAK) KIT Please dispense based on patient and insurance preference. Use as directed to monitor FSBS 2x daily. Dx: E11.9. 04/12/18   Salley Scarlet, MD  Calcium 500 MG tablet Take 600 mg by mouth 2 (two) times daily.    [provider]  dexamethasone (DECADRON) 4 MG tablet Take 5 tablets (20 mg total) by mouth once a week. 03/01/22   Doreatha Massed, MD  fluconazole (DIFLUCAN) 100 MG tablet Take 1 tablet (100 mg total) by mouth daily. Take 2 tablets by mouth on Day 1 and then 1 pill by mouth daily until complete 07/19/22   Doreatha Massed, MD  fluticasone [facility]) 50 MCG/ACT nasal spray Place 2 sprays into both nostrils daily. 05/27/21   [name], MD  gabapentin (NEURONTIN) 300 MG capsule TAKE ONE CAPSULE (300MG  TOTAL) BY MOUTH TWO TIMES DAILY 06/14/22   [name], MD  glipiZIDE (GLUCOTROL) 5 MG tablet TAKE ONE TABLET (5MG  TOTAL) BY MOUTH TWO TIMES DAILY BEFORE A MEAL 06/14/22   [name], MD  Glucose Blood (BLOOD GLUCOSE TEST STRIPS) STRP Please dispense based on patient and insurance preference. Use as directed to monitor FSBS 2x daily. Dx: E11.9. 04/12/18   Salley Scarlet, MD  guaiFENesin (MUCINEX) 600 MG 12 hr tablet Take 1 tablet (600 mg total) by mouth  2 (two) times daily. 07/13/22   Pennington, Rushie Goltz, PA-C  HM LORATADINE 10 MG tablet TAKE ONE TABLET (10MG  TOTAL) BY MOUTH DAILY Patient taking differently: Take 10 mg by mouth daily as needed for allergies. 10/05/19   Salley Scarlet, MD  HYDROcodone-acetaminophen (NORCO/VICODIN) 5-325 MG tablet TAKE ONE (1) TABLET BY MOUTH EVERY 12 HOURS AS NEEDED FOR MODERATE PAIN 06/28/22   Gardenia Phlegm, MD  Lancets MISC Please dispense based on patient and insurance preference. Use as directed to monitor  FSBS 2x daily. Dx: E11.9. 04/12/18   Salley Scarlet, MD  lenalidomide (REVLIMID) 10 MG capsule Take 1 capsule (10 mg total) by mouth daily. 14 days on, 7 days off 07/01/22   Doreatha Massed, MD  levothyroxine (SYNTHROID) 137 MCG tablet Take 1 tablet (137 mcg total) by mouth daily before breakfast. 03/29/22   [name], MD  losartan (COZAAR) 25 MG tablet Take 1 tablet (25 mg total) by mouth daily. 03/29/22   [name], MD  magnesium oxide (MAG-OX) 400 (240 Mg) MG tablet Take 1 tablet (400 mg total) by mouth 3 (three) times daily. 11/03/21   Doreatha Massed, MD  meclizine (ANTIVERT) 25 MG tablet Take 25 mg by mouth every 6 (six) hours as needed for dizziness.    [provider]  megestrol (MEGACE) 400 MG/10ML suspension Take 10 mLs (400 mg total) by mouth 2 (two) times daily. 07/19/22   Doreatha Massed, MD  metFORMIN (GLUCOPHAGE) 1000 MG tablet Take 1 tablet (1,000 mg total) by mouth 2 (two) times daily. 03/29/22   [name], MD  metoprolol tartrate (LOPRESSOR) 25 MG tablet TAKE ONE-HALF TABLET (12.5MG  TOTAL) BY MOUTH TWO TIMES DAILY 07/12/22   [name], MD  nitrofurantoin, macrocrystal-monohydrate, (MACROBID) 100 MG capsule Take 1 capsule (100 mg total) by mouth 2 (two) times daily. 07/13/22   Carnella Guadalajara, PA-C  nystatin (MYCOSTATIN) 100000 UNIT/ML suspension Take 5 mLs (500,000 Units total) by mouth 4 (four) times daily. 07/19/22   Doreatha Massed, MD  omeprazole (PRILOSEC) 40 MG capsule TAKE ONE CAPSULE BY MOUTH DAILY 04/03/20   Salley Scarlet, MD  polyethylene glycol (MIRALAX / GLYCOLAX) 17 g packet Take 17 g by mouth daily. 09/30/20   Shahmehdi, Gemma Payor, MD  potassium chloride (KLOR-CON M) 10 MEQ tablet Take 2 tablets (20 mEq total) by mouth daily. 04/13/22   [name], MD  potassium chloride (KLOR-CON) 10 MEQ tablet Take 20 mEq by mouth daily. 01/23/22   [provider]  simvastatin (ZOCOR) 40 MG tablet TAKE ONE TABLET (40MG   TOTAL) BY MOUTH BEDTIME 07/20/21   [name], MD  traMADol (ULTRAM) 50 MG tablet TAKE ONE TABLET (50MG  TOTAL) BY MOUTH EVERY SIX HOURS AS NEEDED 06/28/22   Doreatha Massed, MD  zinc oxide [facility] ZINC OXIDE) 20 % ointment Apply 1 application topically as needed for irritation. 09/29/20   Kendell Bane, MD      Allergies    Patient has no known allergies.    Review of Systems   Review of Systems  Unable to perform ROS: Mental status change    Physical Exam Updated Vital Signs BP 103/69 (BP Location: Left Arm)   Pulse 86   Temp 98.4 F (36.9 C) (Oral)   Resp 19   Ht 5\' 4"  (1.626 m)   Wt 79.4 kg   SpO2 99%   BMI 30.05 kg/m  Physical Exam Vitals and nursing note reviewed.  Constitutional:  General: She is not in acute distress.    Appearance: She is well-developed. She is ill-appearing. She is not toxic-appearing or diaphoretic.  HENT:     Head: Normocephalic and atraumatic.     Right Ear: External ear normal.     Left Ear: External ear normal.     Nose: Nose normal.     Mouth/Throat:     Mouth: Mucous membranes are moist.     Pharynx: Oropharynx is clear.  Eyes:     Conjunctiva/sclera: Conjunctivae normal.     Pupils: Pupils are equal, round, and reactive to light.  Cardiovascular:     Rate and Rhythm: Normal rate and regular rhythm.     Heart sounds: No murmur heard. Pulmonary:     Effort: Pulmonary effort is normal. No respiratory distress.     Breath sounds: Normal breath sounds. No wheezing, rhonchi or rales.  Abdominal:     General: There is no distension.     Palpations: Abdomen is soft.     Tenderness: There is no abdominal tenderness.  Musculoskeletal:        General: No deformity.     Cervical back: Neck supple. No rigidity.     Right lower leg: No edema.     Left lower leg: No edema.  Skin:    General: Skin is warm and dry.     Capillary Refill: Capillary refill takes less than 2 seconds.     Coloration: Skin is not jaundiced or pale.   Neurological:     Mental Status: She is alert.     GCS: GCS eye subscore is 3. GCS verbal subscore is 2. GCS motor subscore is 5.     Cranial Nerves: No facial asymmetry.     Comments: Not following commands but will have some purposeful movements.  Will resist when I attempt to open eyelids.  Psychiatric:        Mood and Affect: Mood normal.     ED Results / Procedures / Treatments   Labs (all labs ordered are listed, but only abnormal results are displayed) Labs Reviewed  CBC - Abnormal; Notable for the following components:      Result Value   RBC 3.27 (*)    Hemoglobin 9.8 (*)    HCT 31.3 (*)    RDW 16.5 (*)    Platelets 117 (*)    All other components within normal limits  COMPREHENSIVE METABOLIC PANEL - Abnormal; Notable for the following components:   CO2 17 (*)    Glucose, Bld 61 (*)    BUN 32 (*)    Creatinine, Ser 1.19 (*)    Calcium 7.8 (*)    Albumin 2.5 (*)    GFR, Estimated 47 (*)    All other components within normal limits  RAPID URINE DRUG SCREEN, HOSP PERFORMED - Abnormal; Notable for the following components:   Opiates POSITIVE (*)    All other components within normal limits  URINALYSIS, ROUTINE W REFLEX MICROSCOPIC - Abnormal; Notable for the following components:   APPearance TURBID (*)    Hgb urine dipstick SMALL (*)    Protein, ur 100 (*)    Leukocytes,Ua SMALL (*)    Bacteria, UA MANY (*)    All other components within normal limits  LACTIC ACID, PLASMA - Abnormal; Notable for the following components:   Lactic Acid, Venous 3.3 (*)    All other components within normal limits  LACTIC ACID, PLASMA - Abnormal; Notable for the following components:  Lactic Acid, Venous 2.9 (*)    All other components within normal limits  HEMOGLOBIN A1C - Abnormal; Notable for the following components:   Hgb A1c MFr Bld 6.4 (*)    All other components within normal limits  LIPID PANEL - Abnormal; Notable for the following components:   HDL 31 (*)    All  other components within normal limits  CK - Abnormal; Notable for the following components:   Total CK 413 (*)    All other components within normal limits  BLOOD GAS, VENOUS - Abnormal; Notable for the following components:   pCO2, Ven 42 (*)    pO2, Ven <31 (*)    Acid-base deficit 5.7 (*)    All other components within normal limits  GLUCOSE, CAPILLARY - Abnormal; Notable for the following components:   Glucose-Capillary 104 (*)    All other components within normal limits  I-STAT CHEM 8, ED - Abnormal; Notable for the following components:   Sodium 134 (*)    Potassium 6.5 (*)    BUN 45 (*)    Creatinine, Ser 1.20 (*)    Glucose, Bld 102 (*)    Calcium, Ion 0.99 (*)    TCO2 20 (*)    Hemoglobin 10.2 (*)    HCT 30.0 (*)    All other components within normal limits  CBG MONITORING, ED - Abnormal; Notable for the following components:   Glucose-Capillary 20 (*)    All other components within normal limits  CBG MONITORING, ED - Abnormal; Notable for the following components:   Glucose-Capillary 103 (*)    All other components within normal limits  CBG MONITORING, ED - Abnormal; Notable for the following components:   Glucose-Capillary 132 (*)    All other components within normal limits  CBG MONITORING, ED - Abnormal; Notable for the following components:   Glucose-Capillary 110 (*)    All other components within normal limits  TROPONIN I (HIGH SENSITIVITY) - Abnormal; Notable for the following components:   Troponin I (High Sensitivity) 43 (*)    All other components within normal limits  TROPONIN I (HIGH SENSITIVITY) - Abnormal; Notable for the following components:   Troponin I (High Sensitivity) 43 (*)    All other components within normal limits  CULTURE, BLOOD (ROUTINE X 2)  CULTURE, BLOOD (ROUTINE X 2)  C DIFFICILE QUICK SCREEN W PCR REFLEX    URINE CULTURE  DIFFERENTIAL  ETHANOL  PROTIME-INR  APTT  AMMONIA  VITAMIN B12  VITAMIN B1  RPR  COMPREHENSIVE  METABOLIC PANEL  CBC  CBG MONITORING, ED    EKG EKG Interpretation  Date/Time:  Monday July 26 2022 07:29:51 EDT Ventricular Rate:  76 PR Interval:    QRS Duration: 127 QT Interval:  439 QTC Calculation: 484 R Axis:   64 Text Interpretation: indeterminate rhythm Nonspecific intraventricular conduction delay Borderline ST depression, anterolateral leads Confirmed by Gloris Manchester [phone]) on 07/26/2022 9:00:38 AM  Radiology EEG adult  Result Date: 07/26/2022 Charlsie Quest, MD     07/26/2022  4:05 PM Patient Name: Kim Mills MRN: [id number] Epilepsy Attending: Charlsie Quest Referring Physician/Provider: Shon Hale, MD Date: 07/26/2022 Duration: 23.39 mins Patient history: 82 yo F w a PMHX of MM, UTI, HLD, HTN, who presents to the ED with the subacute onset of becoming total-care, not speaking, UTI. Baseline wheelchair bound. Had the acute onset of partial R gaze. EEG to evaluate for seizure Level of alertness: Awake AEDs during EEG study: None Technical  aspects: This EEG study was done with scalp electrodes positioned according to the 10-20 International system of electrode placement. Electrical activity was reviewed with band pass filter of 1-70Hz , sensitivity of 7 uV/mm, display speed of 45mm/sec with a 60Hz  notched filter applied as appropriate. EEG data were recorded continuously and digitally stored.  Video monitoring was available and reviewed as appropriate. Description: No clear posterior dominant rhythm was seen. EEG showed continuous generalized predominantly 5-8hz  theta-alpha activity admixed with intermittent 2-3hz  delta slowing. Hyperventilation and photic stimulation were not performed.  Parts of study were difficult to interpret due to significant myogenic artifact.  ABNORMALITY - Continuous slow, generalized IMPRESSION: This technically difficult study is suggestive of moderate diffuse encephalopathy, nonspecific etiology. No seizures or epileptiform discharges were seen  throughout the recording. Charlsie Quest   DG Chest Port 1 View  Result Date: 07/26/2022 CLINICAL DATA:  Provided history: Questionable sepsis-evaluate for abnormality. EXAM: PORTABLE CHEST 1 VIEW COMPARISON:  Prior chest radiographs 07/13/2022 and earlier. FINDINGS: Heart size within normal limits. Aortic atherosclerosis. Ill-defined opacity within the left lung base, more conspicuous as compared to the prior examination of 07/13/2022. Subtle ill-defined opacity also present within the lateral aspect of the mid right lung, new from the prior exam. No evidence of pleural effusion or pneumothorax. No acute osseous abnormality identified. Degenerative changes of the spine. IMPRESSION: 1. Ill-defined opacities within the left lung base and mid right lung, which may reflect atelectasis or pneumonia. 2.  Aortic Atherosclerosis (ICD10-I70.0). Electronically Signed   By: Jackey Loge D.O.   On: 07/26/2022 08:47   CT HEAD CODE STROKE WO CONTRAST  Result Date: 07/26/2022 CLINICAL DATA:  Code stroke. 82 year old female with altered mental status. Obtunded. EXAM: CT HEAD WITHOUT CONTRAST TECHNIQUE: Contiguous axial images were obtained from the base of the skull through the vertex without intravenous contrast. RADIATION DOSE REDUCTION: This exam was performed according to the departmental dose-optimization program which includes automated exposure control, adjustment of the mA and/or kV according to patient size and/or use of iterative reconstruction technique. COMPARISON:  Head CT 03/26/2020. FINDINGS: Brain: Cerebral volume is stable, within normal limits for age. No midline shift, mass effect, or evidence of intracranial mass lesion. No ventriculomegaly. No acute intracranial hemorrhage identified. Linear chronic appearing small infarct in the right cerebellar hemisphere series 2, image 8 is new since 08-14-2020.a but nearby punctate appearing chronic right cerebellar infarcts are stable (image 7). Patchy white matter  hypodensity appears stable since 08-14-2020, most pronounced in the left corona radiata as before. No cortically based acute infarct identified. And otherwise stable gray-white differentiation. Vascular: No suspicious intracranial vascular hyperdensity. Calcified atherosclerosis at the skull base. Skull: Mild motion artifact. No acute osseous abnormality identified. Sinuses/Orbits: Mild left frontoethmoidal recess mucosal thickening is new. Otherwise Visualized paranasal sinuses and mastoids are stable and well aerated. Other: Slight rightward gaze. Visualized scalp soft tissues are within normal limits. ASPECTS [facility] Stroke Program Early CT Score) Total score (0-10 with 10 being normal): 10 IMPRESSION: 1. No acute cortically based infarct or acute intracranial hemorrhage identified. ASPECTS 10. 2. Mild progression of chronic right cerebellar infarcts since August 14, 2020. Study discussed by telephone with Dr. Gloris Manchester on 07/26/2022 at 07:39 . Electronically Signed   By: Odessa Fleming M.D.   On: 07/26/2022 07:39    Procedures Procedures    Medications Ordered in ED Medications  aspirin EC tablet 81 mg (has no administration in time range)  HYDROcodone-acetaminophen (NORCO/VICODIN) 5-325 MG per tablet 1 tablet (has no administration in  time range)  megestrol (MEGACE) 400 MG/10ML suspension 400 mg (has no administration in time range)  levothyroxine (SYNTHROID) tablet 137 mcg (has no administration in time range)  meclizine (ANTIVERT) tablet 25 mg (has no administration in time range)  pantoprazole (PROTONIX) EC tablet 40 mg (has no administration in time range)  polyethylene glycol (MIRALAX / GLYCOLAX) packet 17 g (has no administration in time range)  gabapentin (NEURONTIN) capsule 300 mg (has no administration in time range)  sodium chloride flush (NS) 0.9 % injection 3 mL (has no administration in time range)  sodium chloride flush (NS) 0.9 % injection 3 mL (has no administration in time range)  sodium chloride  flush (NS) 0.9 % injection 3 mL (has no administration in time range)  0.9 %  sodium chloride infusion (has no administration in time range)  acetaminophen (TYLENOL) tablet 650 mg (has no administration in time range)    Or  acetaminophen (TYLENOL) suppository 650 mg (has no administration in time range)  polyethylene glycol (MIRALAX / GLYCOLAX) packet 17 g (has no administration in time range)  bisacodyl (DULCOLAX) suppository 10 mg (has no administration in time range)  ondansetron (ZOFRAN) tablet 4 mg (has no administration in time range)    Or  ondansetron (ZOFRAN) injection 4 mg (has no administration in time range)  heparin injection 5,000 Units (has no administration in time range)  insulin aspart (novoLOG) injection 0-6 Units (has no administration in time range)  albuterol (PROVENTIL) (2.5 MG/3ML) 0.083% nebulizer solution 2.5 mg (has no administration in time range)  hydrALAZINE (APRESOLINE) injection 10 mg (has no administration in time range)  dextrose 10 % infusion (has no administration in time range)  cefTRIAXone (ROCEPHIN) 1 g in sodium chloride 0.9 % 100 mL IVPB (has no administration in time range)  azithromycin (ZITHROMAX) 500 mg in sodium chloride 0.9 % 250 mL IVPB (has no administration in time range)  fluconazole (DIFLUCAN) tablet 200 mg (has no administration in time range)  cefTRIAXone (ROCEPHIN) 1 g in sodium chloride 0.9 % 100 mL IVPB (0 g Intravenous Stopped 07/26/22 1023)  azithromycin (ZITHROMAX) 500 mg in sodium chloride 0.9 % 250 mL IVPB (500 mg Intravenous New Bag/Given 07/26/22 1243)  lactated ringers bolus 1,000 mL (1,000 mLs Intravenous New Bag/Given 07/26/22 0948)  dextrose 10 % infusion (0 mLs Intravenous Stopped 07/26/22 1132)  dextrose 50 % solution 50 mL (50 mLs Intravenous Given 07/26/22 1028)  dextrose 10 % infusion ( Intravenous New Bag/Given 07/26/22 1133)    ED Course/ Medical Decision Making/ A&P                             Medical Decision  Making Amount and/or Complexity of Data Reviewed Labs: ordered. Radiology: ordered.  Risk Prescription drug management. Decision regarding hospitalization.   This patient presents to the ED for concern of altered mental status, this involves an extensive number of treatment options, and is a complaint that carries with it a high risk of complications and morbidity.  The differential diagnosis includes CVA, seizures, metabolic derangements, polypharmacy, infection   Co morbidities that complicate the patient evaluation  T2DM, arthritis, HTN, HLD, GERD, CAD, neuropathy, CKD, multiple myeloma   Additional history obtained:  Additional history obtained from EMS, patient's daughter External records from outside source obtained and reviewed including EMR   Lab Tests:  I Ordered, and personally interpreted labs.  The pertinent results include: Lactate is elevated, further raising suspicion of seizures.  Creatinine is baseline.  Hypocalcemia is present with otherwise normal electrolytes.  Hemoglobin is slightly decreased from baseline.  No leukocytosis is present.   Imaging Studies ordered:  I ordered imaging studies including chest x-ray, CT head I independently visualized and interpreted imaging which showed possible multifocal pneumonia on chest x-ray, no ICH or acute CVA identified on CT head. I agree with the radiologist interpretation   Cardiac Monitoring: / EKG:  The patient was maintained on a cardiac monitor.  I personally viewed and interpreted the cardiac monitored which showed an underlying rhythm of: Sinus rhythm   Consultations Obtained:  I requested consultation with the neurologist, Dr. Gerre Pebbles,  and discussed lab and imaging findings as well as pertinent plan - they recommend: Admission for CVA/seizure workup, MRI, EEG, no AEDs for now   Problem List / ED Course / Critical interventions / Medication management  Patient presents for altered mental status.   Initial history is provided by EMS.  EMS reported that she was last known well last night.  Although there was a report of right gaze on arrival, my initial inspection, no gaze deviation is present.  Patient is currently not following commands.  She is maintaining her airway.  Code stroke was initiated.  Patient was taken to CT scanner.  Noncontrasted CT scan showed progression of prior CVA but no acute findings.  I spoke with neurologist on-call, recommends admission for further workup.  Seizure is high on differential.  Daughter confirms that she has no known history of prior seizures.  Seizure may have been in the setting of hypoglycemia, identified by EMS on scene.  Neurology does not recommend initiation of AEDs.  MRI and EEG were ordered.  Initial lactate is elevated consistent with likely seizure.  On reassessment, patient has recurrence of right gaze deviation in addition to stiffening of limbs.  On recheck of CBG, patient is found to have recurrence of hypoglycemia.  D50 amp and D10 infusion were ordered.  Chest x-ray showed concern of pneumonia.  Patient was treated empirically with ceftriaxone and azithromycin.  He was admitted to medicine for further management. I ordered medication including ceftriaxone and azithromycin for empiric treatment of pneumonia; dextrose for hypoglycemia Reevaluation of the patient after these medicines showed that the patient improved I have reviewed the patients home medicines and have made adjustments as needed   Social Determinants of Health:  Lives at home with family, not ambulatory at baseline  CRITICAL CARE Performed by: Gloris Manchester   Total critical care time: 35 minutes  Critical care time was exclusive of separately billable procedures and treating other patients.  Critical care was necessary to treat or prevent imminent or life-threatening deterioration.  Critical care was time spent personally by me on the following activities: development of  treatment plan with patient and/or surrogate as well as nursing, discussions with consultants, evaluation of patient's response to treatment, examination of patient, obtaining history from patient or surrogate, ordering and performing treatments and interventions, ordering and review of laboratory studies, ordering and review of radiographic studies, pulse oximetry and re-evaluation of patient's condition.         Final Clinical Impression(s) / ED Diagnoses Final diagnoses:  Hypoglycemia  Seizure [facility])  Multifocal pneumonia    Rx / DC Orders ED Discharge Orders     None         Gloris Manchester, MD 07/26/22 1844

## 2022-07-26 NOTE — ED Notes (Signed)
Patient provided with warm blanket

## 2022-07-26 NOTE — Progress Notes (Signed)
Spoke with nurse for ED patient, was advised that she has already left for [facility].

## 2022-07-26 NOTE — Progress Notes (Signed)
Elert 0723  LNW 2200 6/16. R gaze preference today. Pts daughter states that the pt has had a UTI for the last few weeks and that she has been weaker than normal due to that. Pt normally can assist with transfers from bed to wheelchair but has not been able to recently. Pt normally can speak but also has not been able to recently per daughter. mRS 5.  Pt already in CT at time of elert at 0723 Pt back from CT 0728 TS paged 0737 Dr. Gerre Pebbles on screen [phone]

## 2022-07-26 NOTE — ED Triage Notes (Signed)
Patient BIB [facility] for unresponsive. Per family, patient was "difficult to awake". Patient was at baseline last night before bed. Patient arrives with a fixed right gaze. Patient's BGL 30 with EMS, D10 given, then BGL 217.   Dr. Durwin Nora at bedside for [facility].

## 2022-07-26 NOTE — ED Notes (Signed)
Patient's IV in L AC beside patient, not in patient's arm, catheter intact. Patient's receiving D10 in R IV. This RN attempted x 2 for IV, without success. Emilee, RN at beside for USG IV.

## 2022-07-26 NOTE — ED Notes (Signed)
Patient transported to [facility] 3W via [facility] at this time.

## 2022-07-26 NOTE — Consult Note (Signed)
[facility] Consult Services   Patient Name:   Kim Mills, Kim Mills Date of Birth:   1940-06-12 Identification Number:   MRN - [id number] Date of Service:   07/26/2022 07:37:50  Diagnosis:       G93.41 - Encephalopathy Metabolic       G40.909 - Nonintractable epilepsy without status epilepticus, unspecified epilepsy type (HCC)       I63.89 - Cerebrovascular accident (CVA) due to other mechanism [facility])  Impression:      This is a 82 yo F w a PMHX of MM, UTI, HLD, HTN, who presents to the ED with the subacute onset of becoming total-care, not speaking, UTI. Baseline wheelchair bound. Had the acute onset of partial R gaze. She was last known to be at baseline a few days ago, but R gaze only noted this AM. On arrival to the ED her symptoms remain persistent. BP was found to be 99 systolic. BGL was 30.        Physical exam with some spontaneous movement of all 4 limbs, not following commands, requiring repeated stimulation to open eyes, barely withdraws to pain. Partial R gaze seen at times.  Labs pending  Imaging with [facility] pending official read      A partial gaze palsy is a cortical sign. Stroke and seizure and reversible encephalopathy are high on the differential.      -No AEDs for now  -MRI brain w and wo con  -rEEG  -CTA head and neck when able-->even if LVO found, will not change management due to LKW over 24 hours ago as well as poor functional status.  -Labs for irregularities that can lower the seizure threshold or lead to a reversible encephalopathy: UA, Utox, thiamine, ammonia, RPR, TSH, B12, ABG, alcohol, A1C, CBC, CMP, Lipids, LFTs, CPK, lactic acid, blood cultures if febrile.  Our recommendations are outlined below.  Recommendations:        Stroke/Telemetry Floor       Neuro Checks       Bedside Swallow Eval       DVT Prophylaxis       IV Fluids, Normal Saline       Head of Bed 30 Degrees       Euglycemia and Avoid Hyperthermia (PRN  Acetaminophen)    ------------------------------------------------------------------------------  Advanced Imaging: Advanced Imaging Deferred because:  Poor functional status at baseline, a greater risk than benefit with acute intervention   Metrics: Last Known Well: Unknown [facility] Notification Time: 07/26/2022 07:37:50 Arrival Time: 07/26/2022 07:14:00 Stamp Time: 07/26/2022 07:37:50 Initial Response Time: 07/26/2022 07:40:08 Symptoms: AMS, R gaze, not talking, total care. Initial patient interaction: 07/26/2022 07:44:43 NIHSS Assessment Completed: 07/26/2022 07:57:25 Patient is not a candidate for Thrombolytic. Thrombolytic Medical Decision: 07/26/2022 07:57:26 Patient was not deemed candidate for Thrombolytic because of following reasons: Last Well Known Above 4.5 Hours.  I personally Reviewed the CT Head and it Showed  Primary Provider Notified of Diagnostic Impression and Management Plan on: 07/26/2022 08:29:09    ------------------------------------------------------------------------------  History of Present Illness: Patient is a 82 year old Female.  Patient was brought by EMS for symptoms of AMS, R gaze, not talking, total care. This is a 82 yo F w a PMHX of MM, UTI, who presents to the ED with the subacute onset of becoming total-care, not speaking, UTI. Baseline wheelchair bound. Had the acute onset of partial R gaze. She was last known to be at baseline a few days ago, but R gaze only noted this AM.  On arrival to the ED her symptoms remain persistent. BP was found to be 99 systolic. BGL was 30. She was taken immediately for [facility] and further evaluation. Decision on whether or not to give pharmacological thrombolysis was made based on indications, contraindications, and patient's disability status and preference.     Medications:  Anticoagulant use:  Unknown Antiplatelet use: Unknown Reviewed EMR for current medications  Allergies:  NKDA  Social  History: Smoking: No Alcohol Use: No Drug Use: No  Family History:  There is no family history of premature cerebrovascular disease pertinent to this consultation  ROS : 14 Points Review of Systems was performed and was negative except mentioned in HPI.  Past Surgical History: There Is No Surgical History Contributory To Today's Visit     Examination: BP(99/63), Pulse(73), 1A: Level of Consciousness - Requires repeated stimulation to arouse + 2 1B: Ask Month and Age - Could Not Answer Either Question Correctly + 2 1C: Blink Eyes & Squeeze Hands - Performs 0 Tasks + 2 2: Test Horizontal Extraocular Movements - Partial Gaze Palsy: Can Be Overcome + 1 3: Test Visual Fields - No Visual Loss + 0 4: Test Facial Palsy (Use Grimace if Obtunded) - Normal symmetry + 0 5A: Test Left Arm Motor Drift - No Effort Against Gravity + 3 5B: Test Right Arm Motor Drift - No Effort Against Gravity + 3 6A: Test Left Leg Motor Drift - No Effort Against Gravity + 3 6B: Test Right Leg Motor Drift - No Effort Against Gravity + 3 7: Test Limb Ataxia (FNF/Heel-Shin) - No Ataxia + 0 8: Test Sensation - Normal; No sensory loss + 0 9: Test Language/Aphasia - Normal; No aphasia + 0 10: Test Dysarthria - Mute/Anarthric + 2 11: Test Extinction/Inattention - No abnormality + 0  NIHSS Score: 21   Pre-Morbid Modified Rankin Scale: 4 Points = Moderately severe disability; unable to walk and attend to bodily needs without assistance  Spoke with : attending  This consult was conducted in real time using interactive audio and Immunologist. Patient was informed of the technology being used for this visit and agreed to proceed. Patient located in hospital and provider located at home/office setting.   Patient is being evaluated for possible acute neurologic impairment and high probability of imminent or life-threatening deterioration. I spent total of 35 minutes providing care to this patient, including time  for face to face visit via telemedicine, review of medical records, imaging studies and discussion of findings with providers, the patient and/or family.   Dr Richardo Priest   [facility] For Inpatient follow-up with [facility] physician please call [facility] [phone]. This is not an outpatient service. Post hospital discharge, please contact hospital directly.  Please do not communicate with [facility] physicians via secure chat. If you have any questions, Please contact [facility]. Please call or reconsult our service if there are any clinical or diagnostic changes.

## 2022-07-26 NOTE — ED Notes (Signed)
RT at bedside.

## 2022-07-26 NOTE — ED Notes (Signed)
CBG 20, EDP Dixon made aware. Amp d50 given

## 2022-07-26 NOTE — ED Notes (Signed)
X-ray at bedside

## 2022-07-26 NOTE — H&P (Signed)
Patient Demographics:    Kim Mills, is a 82 y.o. female  MRN: [id number]   DOB - [birth year]  Admit Date - 07/26/2022  Outpatient Primary MD for the patient is [name], MD   Assessment & Plan:   Assessment and Plan:  1)Recurrent Hypoglycemia--- due to diarrhea/GI losses after laxatives and compounded by poor oral intake and hypoglycemic medication use --On 07/26/2022 she was found to be minimally responsive with concerns about right eye deviation--- EMS found her blood sugar to be around 30 and gave dextrose solution -In the ED around 10:20 AM medical sugar dropped back to 20--- she received further glucose infusion -- hold PTA metformin and glipizide -Continue IV dextrose infusion until patient is awake enough to have reliable oral intake  2)Acute Metabolic Encephalopathy--- Remained lethargic and nonverbal -Teleneuro consult requested by EDP recommends stroke and seizure workup -Family concerned about possible recurrent seizures--- due to concerns about possible status epilepticus patient was transferred to [facility] for further neurology evaluation --CT head  without new acute findings there is evidence of mild progression of chronic right cerebellar infarcts -MRI brain pending -EEG without epileptiform findings -serum ammonia is 21 --UDS with opiates--which are prescribed for patient for arthritis  3)Social/ethics--- plan of care and advanced directive discussed with patient's daughter Denzil Magnuson and son Dan Humphreys --At this time CODE STATUS unclear keep a full code and get palliative care involved  4) possible pneumonia and possible UTI-----Chest x-ray with possible  pneumonia--- , -UA suggestive of possible UTI and yeast -WBC 5.3 -Lactic acid 3.3, repeat 2.9 -Rocephin/azithromycin and Diflucan  as ordered -Patient was recently treated with Cipro for E. coli UTI from urine culture dated 07/13/2022   5) anemia thrombocytopenia----in the setting of multiple myeloma with ongoing chemotherapy -hemoglobin 9.8 and platelets 117--- monitor closely, no bleeding concerns at this time  6)DM2----A1c is 6.4, reflecting good diabetic control PTA -Recurrent hypoglycemia show patient is currently on dextrose solution as above -Continue Accu-Cheks/fingersticks  7) advanced arthritis with ambulatory dysfunction--- patient is wheelchair-bound at baseline  8) multiple myeloma----patient sees Dr. Ellin Saba for ongoing chemotherapy  9)CKD stage -3A -Renal function appears to be close to baseline at this time - renally adjust medications, avoid nephrotoxic agents / dehydration  / hypotension  10)HLD--- hold simvastatin  11) hypothyroidism--- continue levothyroxine  Dispo: The patient is from: Home              Anticipated d/c is to: Home              Anticipated d/c date is: 2 days              Patient currently is not medically stable to d/c. Barriers: Not Clinically Stable-   With History of - Reviewed by me  Past Medical History:  Diagnosis Date   Arthritis    Cancer (HCC)    Cataract    Chest pain    Associated with weakness and  fatigue   Diabetes mellitus    A1c of 7.4 in 08/2010   GERD (gastroesophageal reflux disease)    Glaucoma    Hyperlipidemia    Lipid profile in 08/2010:190, 121, 48, 118; normal CBC and CMet   Hypertension    Lipid profile in 08/2010:190, 121, 48, 118; normal CBC and CMet   Hypothyroidism    Hypothyroidism    Obesity       Past Surgical History:  Procedure Laterality Date   CHOLECYSTECTOMY     COLONOSCOPY N/A 04/01/2014   RMR: Melanosis coli. colonic polyps removed as described above.    CYSTOSCOPY W/ URETERAL STENT PLACEMENT Right 09/13/2019   Procedure: CYSTOSCOPY WITH RIGHT  RETROGRADE PYELOGRAM; BLADDER BIOPSY;  Surgeon: Malen Gauze, MD;   Location: [facility];  Service: Urology;  Laterality: Right;   LEFT HEART CATH AND CORONARY ANGIOGRAPHY N/A 10/07/2017   Procedure: LEFT HEART CATH AND CORONARY ANGIOGRAPHY;  Surgeon: Lennette Bihari, MD;  Location: [facility];  Service: Cardiovascular;  Laterality: N/A;   THYROIDECTOMY, PARTIAL     Chief Complaint  Patient presents with   Code Stroke      HPI:    Kim Mills  is a 82 y.o. female with past medical history relevant for advanced arthritis with ambulatory dysfunction, DM2 , HTN, HLD, GERD, CAD, neuropathy, CKD 3A, multiple myeloma who presents to the ED with altered mentation and recurrent hypoglycemia- - History is limited due to altered mentation/lethargy with unresponsiveness, patient was responsive to tactile and noxious stimuli, patient is nonverbal  Additional history obtained from patient's daughter Denzil Magnuson and son Dan Humphreys  --patient was diagnosed with E. coli UTI on 07/13/2022 and treated with Cipro, concerned about drug drug interaction affect Cipro with her chemotherapy agent-- -patient developed weakness and altered mentation while taking Cipro -She is subsequently improved around 07/24/2022 -On 07/25/2022 she received laxatives for constipation and had multiple episodes of loose/watery stools -On 07/26/2022 she was found to be minimally responsive with concerns about right eye deviation--- EMS found her blood sugar to be around 30 and gave dextrose solution -In the ED around 10:20 AM medical sugar dropped back to 20--- she received further glucose infusion -Remained lethargic and nonverbal -Teleneuro consult requested by EDP recommends stroke and seizure workup -Family concerned about possible recurrent seizures--- due to concerns about possible status epilepticus patient was transferred to [facility] for further neurology evaluation No fever  Or chills  -No vomiting -Chest x-ray with possible  pneumonia--- , -UA suggestive of possible UTI and yeast -CT  head without new acute findings there is evidence of mild progression of chronic right cerebellar infarcts -MRI brain pending -EEG without epileptiform findings -UDS with opiates--which are prescribed for patient for arthritis -Initial troponin is 43, repeat is 43 -EKG without ACS type findings B12 is not low, A1c 6.4 -Total CK 413, serum ammonia is 21 -Creatinine is 1.1, glucose 61, LFTs WNL, potassium 5.0, sodium 135, bicarb 17 -WBC 5.3 hemoglobin 9.8 and platelets 117 -Lactic acid 3.3, repeat 2.9   Review of systems:    In addition to the HPI above,   A full Review of  Systems was done, all other systems reviewed are negative except as noted above in HPI , .    Social History:  Reviewed by me    Social History   Tobacco Use   Smoking status: Never    Passive exposure: Yes   Smokeless tobacco: Never  Substance Use Topics   Alcohol  use: Never    Family History :  Reviewed by me   Family History  Problem Relation Age of Onset   Anemia Father    Arthritis Mother    Stroke Brother    Cancer Sister        unknown kind   Diabetes Sister    Dementia Sister    Cancer Niece        Breast   Cancer Brother    Healthy Son    Healthy Son    Healthy Son    Healthy Daughter    Healthy Daughter    Healthy Daughter    Sarcoidosis Daughter    Home Medications:   Prior to Admission medications   Medication Sig Start Date End Date Taking? Authorizing Provider  clotrimazole (MYCELEX) 10 MG troche Take 1 tablet (10 mg total) by mouth 5 (five) times daily. 07/19/22   Doreatha Massed, MD  albuterol (VENTOLIN HFA) 108 (90 Base) MCG/ACT inhaler Inhale 2 puffs into the lungs every 6 (six) hours as needed for wheezing or shortness of breath. 01/14/21   [name], MD  ASPIRIN 81 PO Take 1 tablet by mouth daily.    [provider]  blood glucose meter kit and supplies Dispense based on patient and insurance preference. Use up to four times daily as directed. (FOR  ICD-10 E10.9, E11.9). 10/23/20   [name], MD  Blood Glucose Monitoring Suppl (BLOOD GLUCOSE SYSTEM PAK) KIT Please dispense based on patient and insurance preference. Use as directed to monitor FSBS 2x daily. Dx: E11.9. 04/12/18   Salley Scarlet, MD  Calcium 500 MG tablet Take 600 mg by mouth 2 (two) times daily.    [provider]  dexamethasone (DECADRON) 4 MG tablet Take 5 tablets (20 mg total) by mouth once a week. 03/01/22   Doreatha Massed, MD  fluconazole (DIFLUCAN) 100 MG tablet Take 1 tablet (100 mg total) by mouth daily. Take 2 tablets by mouth on Day 1 and then 1 pill by mouth daily until complete 07/19/22   Doreatha Massed, MD  fluticasone [facility]) 50 MCG/ACT nasal spray Place 2 sprays into both nostrils daily. 05/27/21   [name], MD  gabapentin (NEURONTIN) 300 MG capsule TAKE ONE CAPSULE (300MG  TOTAL) BY MOUTH TWO TIMES DAILY 06/14/22   [name], MD  glipiZIDE (GLUCOTROL) 5 MG tablet TAKE ONE TABLET (5MG  TOTAL) BY MOUTH TWO TIMES DAILY BEFORE A MEAL 06/14/22   [name], MD  Glucose Blood (BLOOD GLUCOSE TEST STRIPS) STRP Please dispense based on patient and insurance preference. Use as directed to monitor FSBS 2x daily. Dx: E11.9. 04/12/18   Salley Scarlet, MD  guaiFENesin (MUCINEX) 600 MG 12 hr tablet Take 1 tablet (600 mg total) by mouth 2 (two) times daily. 07/13/22   Pennington, Rushie Goltz, PA-C  HM LORATADINE 10 MG tablet TAKE ONE TABLET (10MG  TOTAL) BY MOUTH DAILY Patient taking differently: Take 10 mg by mouth daily as needed for allergies. 10/05/19   Salley Scarlet, MD  HYDROcodone-acetaminophen (NORCO/VICODIN) 5-325 MG tablet TAKE ONE (1) TABLET BY MOUTH EVERY 12 HOURS AS NEEDED FOR MODERATE PAIN 06/28/22   Gardenia Phlegm, MD  Lancets MISC Please dispense based on patient and insurance preference. Use as directed to monitor FSBS 2x daily. Dx: E11.9. 04/12/18   Salley Scarlet, MD  lenalidomide (REVLIMID) 10 MG capsule Take 1 capsule (10 mg  total) by mouth daily. 14 days on, 7 days off 07/01/22  Doreatha Massed, MD  levothyroxine (SYNTHROID) 137 MCG tablet Take 1 tablet (137 mcg total) by mouth daily before breakfast. 03/29/22   [name], MD  losartan (COZAAR) 25 MG tablet Take 1 tablet (25 mg total) by mouth daily. 03/29/22   [name], MD  magnesium oxide (MAG-OX) 400 (240 Mg) MG tablet Take 1 tablet (400 mg total) by mouth 3 (three) times daily. 11/03/21   Doreatha Massed, MD  meclizine (ANTIVERT) 25 MG tablet Take 25 mg by mouth every 6 (six) hours as needed for dizziness.    [provider]  megestrol (MEGACE) 400 MG/10ML suspension Take 10 mLs (400 mg total) by mouth 2 (two) times daily. 07/19/22   Doreatha Massed, MD  metFORMIN (GLUCOPHAGE) 1000 MG tablet Take 1 tablet (1,000 mg total) by mouth 2 (two) times daily. 03/29/22   [name], MD  metoprolol tartrate (LOPRESSOR) 25 MG tablet TAKE ONE-HALF TABLET (12.5MG  TOTAL) BY MOUTH TWO TIMES DAILY 07/12/22   [name], MD  nitrofurantoin, macrocrystal-monohydrate, (MACROBID) 100 MG capsule Take 1 capsule (100 mg total) by mouth 2 (two) times daily. 07/13/22   Carnella Guadalajara, PA-C  nystatin (MYCOSTATIN) 100000 UNIT/ML suspension Take 5 mLs (500,000 Units total) by mouth 4 (four) times daily. 07/19/22   Doreatha Massed, MD  omeprazole (PRILOSEC) 40 MG capsule TAKE ONE CAPSULE BY MOUTH DAILY 04/03/20   Salley Scarlet, MD  polyethylene glycol (MIRALAX / GLYCOLAX) 17 g packet Take 17 g by mouth daily. 09/30/20   Shahmehdi, Gemma Payor, MD  potassium chloride (KLOR-CON M) 10 MEQ tablet Take 2 tablets (20 mEq total) by mouth daily. 04/13/22   [name], MD  potassium chloride (KLOR-CON) 10 MEQ tablet Take 20 mEq by mouth daily. 01/23/22   [provider]  simvastatin (ZOCOR) 40 MG tablet TAKE ONE TABLET (40MG  TOTAL) BY MOUTH BEDTIME 07/20/21   [name], MD  traMADol (ULTRAM) 50 MG tablet TAKE ONE TABLET (50MG  TOTAL) BY MOUTH  EVERY SIX HOURS AS NEEDED 06/28/22   Doreatha Massed, MD  zinc oxide [facility] ZINC OXIDE) 20 % ointment Apply 1 application topically as needed for irritation. 09/29/20   Kendell Bane, MD     Allergies:    No Known Allergies   Physical Exam:   Vitals  Blood pressure 103/69, pulse 86, temperature 98.4 F (36.9 C), temperature source Oral, resp. rate 19, height 5\' 4"  (1.626 m), weight 79.4 kg, SpO2 99 %.  Physical Examination: General appearance -very lethargic, nonverbal  mental status -responsive to noxious and tactile stimuli, otherwise lethargic and really nonverbal  eyes - sclera anicteric, rt eye gaze is "somewhat off" Neck - supple, no JVD elevation , Chest - clear  to auscultation bilaterally, symmetrical air movement,  Heart - S1 and S2 normal, regular  Abdomen - soft, nontender, nondistended, +BS Neurological -exam is limited due to altered mentation/lethargy with unresponsiveness, patient was responsive to tactile and noxious stimuli, patient is nonverbal  -extremities - no pedal edema noted, intact peripheral pulses  Skin - warm, dry     Data Review:    CBC Recent Labs  Lab 07/26/22 0740 07/26/22 0748  WBC 5.3  --   HGB 9.8* 10.2*  HCT 31.3* 30.0*  PLT 117*  --   MCV 95.7  --   MCH 30.0  --   MCHC 31.3  --   RDW 16.5*  --   LYMPHSABS 1.5  --   MONOABS 0.4  --  EOSABS 0.1  --   BASOSABS 0.0  --    ------------------------------------------------------------------------------------------------------------------  Chemistries  Recent Labs  Lab 07/26/22 0740 07/26/22 0748  NA 135 134*  K 5.0 6.5*  CL 108 110  CO2 17*  --   GLUCOSE 61* 102*  BUN 32* 45*  CREATININE 1.19* 1.20*  CALCIUM 7.8*  --   AST 34  --   ALT 31  --   ALKPHOS 88  --   BILITOT 0.8  --    ------------------------------------------------------------------------------------------------------------------ estimated creatinine clearance is 38.8 mL/min (A) (by C-G formula  based on SCr of 1.2 mg/dL (H)). ------------------------------------------------------------------------------------------------------------------  Coagulation profile Recent Labs  Lab 07/26/22 0805  INR 1.2   ------------------------------------------------------------------------------------------------------------------------------------    Component Value Date/Time   BNP 138.0 (H) 10/05/2017 1845   Urinalysis    Component Value Date/Time   COLORURINE YELLOW 07/26/2022 1005   APPEARANCEUR TURBID (A) 07/26/2022 1005   LABSPEC 1.012 07/26/2022 1005   PHURINE 5.0 07/26/2022 1005   GLUCOSEU NEGATIVE 07/26/2022 1005   HGBUR SMALL (A) 07/26/2022 1005   BILIRUBINUR NEGATIVE 07/26/2022 1005   BILIRUBINUR neg 08/27/2019 1455   KETONESUR NEGATIVE 07/26/2022 1005   PROTEINUR 100 (A) 07/26/2022 1005   UROBILINOGEN 2.0 (A) 08/27/2019 1455   NITRITE NEGATIVE 07/26/2022 1005   LEUKOCYTESUR SMALL (A) 07/26/2022 1005    Imaging Results:    EEG adult  Result Date: 07/26/2022 Charlsie Quest, MD     07/26/2022  4:05 PM Patient Name: FATUMA CANIZALES MRN: [id number] Epilepsy Attending: Charlsie Quest Referring Physician/Provider: Shon Hale, MD Date: 07/26/2022 Duration: 23.39 mins Patient history: 82 yo F w a PMHX of MM, UTI, HLD, HTN, who presents to the ED with the subacute onset of becoming total-care, not speaking, UTI. Baseline wheelchair bound. Had the acute onset of partial R gaze. EEG to evaluate for seizure Level of alertness: Awake AEDs during EEG study: None Technical aspects: This EEG study was done with scalp electrodes positioned according to the 10-20 International system of electrode placement. Electrical activity was reviewed with band pass filter of 1-70Hz , sensitivity of 7 uV/mm, display speed of 50mm/sec with a 60Hz  notched filter applied as appropriate. EEG data were recorded continuously and digitally stored.  Video monitoring was available and reviewed as appropriate.  Description: No clear posterior dominant rhythm was seen. EEG showed continuous generalized predominantly 5-8hz  theta-alpha activity admixed with intermittent 2-3hz  delta slowing. Hyperventilation and photic stimulation were not performed.  Parts of study were difficult to interpret due to significant myogenic artifact.  ABNORMALITY - Continuous slow, generalized IMPRESSION: This technically difficult study is suggestive of moderate diffuse encephalopathy, nonspecific etiology. No seizures or epileptiform discharges were seen throughout the recording. Charlsie Quest   DG Chest Port 1 View  Result Date: 07/26/2022 CLINICAL DATA:  Provided history: Questionable sepsis-evaluate for abnormality. EXAM: PORTABLE CHEST 1 VIEW COMPARISON:  Prior chest radiographs 07/13/2022 and earlier. FINDINGS: Heart size within normal limits. Aortic atherosclerosis. Ill-defined opacity within the left lung base, more conspicuous as compared to the prior examination of 07/13/2022. Subtle ill-defined opacity also present within the lateral aspect of the mid right lung, new from the prior exam. No evidence of pleural effusion or pneumothorax. No acute osseous abnormality identified. Degenerative changes of the spine. IMPRESSION: 1. Ill-defined opacities within the left lung base and mid right lung, which may reflect atelectasis or pneumonia. 2.  Aortic Atherosclerosis (ICD10-I70.0). Electronically Signed   By: Jackey Loge D.O.   On: 07/26/2022 08:47  CT HEAD CODE STROKE WO CONTRAST  Result Date: 07/26/2022 CLINICAL DATA:  Code stroke. 81 year old female with altered mental status. Obtunded. EXAM: CT HEAD WITHOUT CONTRAST TECHNIQUE: Contiguous axial images were obtained from the base of the skull through the vertex without intravenous contrast. RADIATION DOSE REDUCTION: This exam was performed according to the departmental dose-optimization program which includes automated exposure control, adjustment of the mA and/or kV  according to patient size and/or use of iterative reconstruction technique. COMPARISON:  Head CT 03/26/2020. FINDINGS: Brain: Cerebral volume is stable, within normal limits for age. No midline shift, mass effect, or evidence of intracranial mass lesion. No ventriculomegaly. No acute intracranial hemorrhage identified. Linear chronic appearing small infarct in the right cerebellar hemisphere series 2, image 8 is new since August 15, 2020.a but nearby punctate appearing chronic right cerebellar infarcts are stable (image 7). Patchy white matter hypodensity appears stable since August 15, 2020, most pronounced in the left corona radiata as before. No cortically based acute infarct identified. And otherwise stable gray-white differentiation. Vascular: No suspicious intracranial vascular hyperdensity. Calcified atherosclerosis at the skull base. Skull: Mild motion artifact. No acute osseous abnormality identified. Sinuses/Orbits: Mild left frontoethmoidal recess mucosal thickening is new. Otherwise Visualized paranasal sinuses and mastoids are stable and well aerated. Other: Slight rightward gaze. Visualized scalp soft tissues are within normal limits. ASPECTS [facility] Stroke Program Early CT Score) Total score (0-10 with 10 being normal): 10 IMPRESSION: 1. No acute cortically based infarct or acute intracranial hemorrhage identified. ASPECTS 10. 2. Mild progression of chronic right cerebellar infarcts since 08-15-20. Study discussed by telephone with Dr. Gloris Manchester on 07/26/2022 at 07:39 . Electronically Signed   By: Odessa Fleming M.D.   On: 07/26/2022 07:39    Radiological Exams on Admission: EEG adult  Result Date: 07/26/2022 Charlsie Quest, MD     07/26/2022  4:05 PM Patient Name: EVGENIA LUSCOMBE MRN: [id number] Epilepsy Attending: Charlsie Quest Referring Physician/Provider: Shon Hale, MD Date: 07/26/2022 Duration: 23.39 mins Patient history: 82 yo F w a PMHX of MM, UTI, HLD, HTN, who presents to the ED with the subacute onset of  becoming total-care, not speaking, UTI. Baseline wheelchair bound. Had the acute onset of partial R gaze. EEG to evaluate for seizure Level of alertness: Awake AEDs during EEG study: None Technical aspects: This EEG study was done with scalp electrodes positioned according to the 10-20 International system of electrode placement. Electrical activity was reviewed with band pass filter of 1-70Hz , sensitivity of 7 uV/mm, display speed of 34mm/sec with a 60Hz  notched filter applied as appropriate. EEG data were recorded continuously and digitally stored.  Video monitoring was available and reviewed as appropriate. Description: No clear posterior dominant rhythm was seen. EEG showed continuous generalized predominantly 5-8hz  theta-alpha activity admixed with intermittent 2-3hz  delta slowing. Hyperventilation and photic stimulation were not performed.  Parts of study were difficult to interpret due to significant myogenic artifact.  ABNORMALITY - Continuous slow, generalized IMPRESSION: This technically difficult study is suggestive of moderate diffuse encephalopathy, nonspecific etiology. No seizures or epileptiform discharges were seen throughout the recording. Charlsie Quest   DG Chest Port 1 View  Result Date: 07/26/2022 CLINICAL DATA:  Provided history: Questionable sepsis-evaluate for abnormality. EXAM: PORTABLE CHEST 1 VIEW COMPARISON:  Prior chest radiographs 07/13/2022 and earlier. FINDINGS: Heart size within normal limits. Aortic atherosclerosis. Ill-defined opacity within the left lung base, more conspicuous as compared to the prior examination of 07/13/2022. Subtle ill-defined opacity also present within the lateral aspect of the mid right  lung, new from the prior exam. No evidence of pleural effusion or pneumothorax. No acute osseous abnormality identified. Degenerative changes of the spine. IMPRESSION: 1. Ill-defined opacities within the left lung base and mid right lung, which may reflect atelectasis  or pneumonia. 2.  Aortic Atherosclerosis (ICD10-I70.0). Electronically Signed   By: Jackey Loge D.O.   On: 07/26/2022 08:47   CT HEAD CODE STROKE WO CONTRAST  Result Date: 07/26/2022 CLINICAL DATA:  Code stroke. 82 year old female with altered mental status. Obtunded. EXAM: CT HEAD WITHOUT CONTRAST TECHNIQUE: Contiguous axial images were obtained from the base of the skull through the vertex without intravenous contrast. RADIATION DOSE REDUCTION: This exam was performed according to the departmental dose-optimization program which includes automated exposure control, adjustment of the mA and/or kV according to patient size and/or use of iterative reconstruction technique. COMPARISON:  Head CT 03/26/2020. FINDINGS: Brain: Cerebral volume is stable, within normal limits for age. No midline shift, mass effect, or evidence of intracranial mass lesion. No ventriculomegaly. No acute intracranial hemorrhage identified. Linear chronic appearing small infarct in the right cerebellar hemisphere series 2, image 8 is new since 2020/08/23.a but nearby punctate appearing chronic right cerebellar infarcts are stable (image 7). Patchy white matter hypodensity appears stable since August 23, 2020, most pronounced in the left corona radiata as before. No cortically based acute infarct identified. And otherwise stable gray-white differentiation. Vascular: No suspicious intracranial vascular hyperdensity. Calcified atherosclerosis at the skull base. Skull: Mild motion artifact. No acute osseous abnormality identified. Sinuses/Orbits: Mild left frontoethmoidal recess mucosal thickening is new. Otherwise Visualized paranasal sinuses and mastoids are stable and well aerated. Other: Slight rightward gaze. Visualized scalp soft tissues are within normal limits. ASPECTS [facility] Stroke Program Early CT Score) Total score (0-10 with 10 being normal): 10 IMPRESSION: 1. No acute cortically based infarct or acute intracranial hemorrhage identified. ASPECTS  10. 2. Mild progression of chronic right cerebellar infarcts since 2020/08/23. Study discussed by telephone with Dr. Gloris Manchester on 07/26/2022 at 07:39 . Electronically Signed   By: Odessa Fleming M.D.   On: 07/26/2022 07:39    DVT Prophylaxis -SCD/Heparin AM Labs Ordered, also please review Full Orders  Family Communication: Admission, patients condition and plan of care including tests being ordered have been discussed with the patient and daughter Denzil Magnuson and son Dan Humphreys who indicate understanding and agree with the plan    Condition -fair  Shon Hale M.D on 07/26/2022 at 5:50 PM Go to [email or web address] -  for contact info  [facility] - Office  [phone]

## 2022-07-26 NOTE — ED Notes (Signed)
Patient transported to CT at this time. 

## 2022-07-26 NOTE — Plan of Care (Signed)

## 2022-07-26 NOTE — Progress Notes (Signed)
EEG complete - results pending 

## 2022-07-26 NOTE — ED Notes (Signed)
Patient transported back to room from CT 

## 2022-07-26 NOTE — ED Notes (Signed)
ED TO INPATIENT HANDOFF REPORT  ED Nurse Name and Phone #:   S Name/Age/Gender Kim Mills 82 y.o. female Room/Bed: APOTF/OTF  Code Status   Code Status: Prior   Triage Complete: Triage complete  Chief Complaint Acute metabolic encephalopathy [G93.41]  Triage Note Patient BIB [facility] for unresponsive. Per family, patient was "difficult to awake". Patient was at baseline last night before bed. Patient arrives with a fixed right gaze. Patient's BGL 30 with EMS, D10 given, then BGL 217.   Dr. Durwin Nora at bedside for [facility].   Allergies No Known Allergies  Level of Care/Admitting Diagnosis ED Disposition     ED Disposition  Admit   Condition  --   Comment  Hospital Area: [facility] [[id number]]  Level of Care: Progressive [102]  Admit to Progressive based on following criteria: NEUROLOGICAL AND NEUROSURGICAL complex patients with significant risk of instability, who do not meet ICU criteria, yet require close observation or frequent assessment (< / = every 2 - 4 hours) with medical / nursing intervention.  May place patient in observation at [facility] or [facility] if equivalent level of care is available:: No  Covid Evaluation: Asymptomatic - no recent exposure (last 10 days) testing not required  Diagnosis: Acute metabolic encephalopathy [[id number]]  Admitting Physician: Marylyn Ishihara  Attending Physician: Marylyn Ishihara          B Medical/Surgery History Past Medical History:  Diagnosis Date   Arthritis    Cancer (HCC)    Cataract    Chest pain    Associated with weakness and fatigue   Diabetes mellitus    A1c of 7.4 in 08/2010   GERD (gastroesophageal reflux disease)    Glaucoma    Hyperlipidemia    Lipid profile in 08/2010:190, 121, 48, 118; normal CBC and CMet   Hypertension    Lipid profile in 08/2010:190, 121, 48, 118; normal CBC and CMet   Hypothyroidism    Hypothyroidism    Obesity    Past Surgical History:   Procedure Laterality Date   CHOLECYSTECTOMY     COLONOSCOPY N/A 04/01/2014   RMR: Melanosis coli. colonic polyps removed as described above.    CYSTOSCOPY W/ URETERAL STENT PLACEMENT Right 09/13/2019   Procedure: CYSTOSCOPY WITH RIGHT  RETROGRADE PYELOGRAM; BLADDER BIOPSY;  Surgeon: Malen Gauze, MD;  Location: [facility];  Service: Urology;  Laterality: Right;   LEFT HEART CATH AND CORONARY ANGIOGRAPHY N/A 10/07/2017   Procedure: LEFT HEART CATH AND CORONARY ANGIOGRAPHY;  Surgeon: Lennette Bihari, MD;  Location: [facility] INVAS[facility];  Service: Cardiovascular;  Laterality: N/A;   THYROIDECTOMY, PARTIAL       A IV Location/Drains/Wounds Patient Lines/Drains/Airways Status     Active Line/Drains/Airways     Name Placement date Placement time Site Days   Peripheral IV 07/26/22 20 G 1" Distal;Posterior;Right Forearm 07/26/22  --  Forearm  less than 1   Peripheral IV 07/26/22 22 G 1.88" Right;Medial;Anterior Antecubital 07/26/22  1239  Antecubital  less than 1   External Urinary Catheter 07/26/22  1042  --  less than 1            Intake/Output Last 24 hours No intake or output data in the 24 hours ending 07/26/22 1346  Labs/Imaging Results for orders placed or performed during the hospital encounter of 07/26/22 (from the past 48 hour(s))  CBG monitoring, ED     Status: None   Collection Time: 07/26/22  7:19 AM  Result  Value Ref Range   Glucose-Capillary 94 70 - 99 mg/dL    Comment: Glucose reference range applies only to samples taken after fasting for at least 8 hours.  CBC     Status: Abnormal   Collection Time: 07/26/22  7:40 AM  Result Value Ref Range   WBC 5.3 4.0 - 10.5 K/uL   RBC 3.27 (L) 3.87 - 5.11 MIL/uL   Hemoglobin 9.8 (L) 12.0 - 15.0 g/dL   HCT 16.1 (L) 09.6 - 04.5 %   MCV 95.7 80.0 - 100.0 fL   MCH 30.0 26.0 - 34.0 pg   MCHC 31.3 30.0 - 36.0 g/dL   RDW 40.9 (H) 81.1 - 91.4 %   Platelets 117 (L) 150 - 400 K/uL   nRBC 0.0 0.0 - 0.2 %    Comment: Performed at  [facility] [address] [place], [place] [address]  Differential     Status: None   Collection Time: 07/26/22  7:40 AM  Result Value Ref Range   Neutrophils Relative % 62 %   Neutro Abs 3.3 1.7 - 7.7 K/uL   Lymphocytes Relative 28 %   Lymphs Abs 1.5 0.7 - 4.0 K/uL   Monocytes Relative 7 %   Monocytes Absolute 0.4 0.1 - 1.0 K/uL   Eosinophils Relative 1 %   Eosinophils Absolute 0.1 0.0 - 0.5 K/uL   Basophils Relative 1 %   Basophils Absolute 0.0 0.0 - 0.1 K/uL   Immature Granulocytes 1 %   Abs Immature Granulocytes 0.07 0.00 - 0.07 K/uL    Comment: Performed at [facility], [address] [place], [place] [address]  Comprehensive metabolic panel     Status: Abnormal   Collection Time: 07/26/22  7:40 AM  Result Value Ref Range   Sodium 135 135 - 145 mmol/L   Potassium 5.0 3.5 - 5.1 mmol/L   Chloride 108 98 - 111 mmol/L   CO2 17 (L) 22 - 32 mmol/L   Glucose, Bld 61 (L) 70 - 99 mg/dL    Comment: Glucose reference range applies only to samples taken after fasting for at least 8 hours.   BUN 32 (H) 8 - 23 mg/dL   Creatinine, Ser 8.65 (H) 0.44 - 1.00 mg/dL   Calcium 7.8 (L) 8.9 - 10.3 mg/dL   Total Protein 6.5 6.5 - 8.1 g/dL   Albumin 2.5 (L) 3.5 - 5.0 g/dL   AST 34 15 - 41 U/L   ALT 31 0 - 44 U/L   Alkaline Phosphatase 88 38 - 126 U/L   Total Bilirubin 0.8 0.3 - 1.2 mg/dL   GFR, Estimated 47 (L) >60 mL/min    Comment: (NOTE) Calculated using the CKD-EPI Creatinine Equation (2021)    Anion gap 10 5 - 15    Comment: Performed at [facility], [address], [place], [place] [address]  I-stat chem 8, ED     Status: Abnormal   Collection Time: 07/26/22  7:48 AM  Result Value Ref Range   Sodium 134 (L) 135 - 145 mmol/L   Potassium 6.5 (HH) 3.5 - 5.1 mmol/L   Chloride 110 98 - 111 mmol/L   BUN 45 (H) 8 - 23 mg/dL   Creatinine, Ser 6.29 (H) 0.44 - 1.00 mg/dL   Glucose, Bld 528 (H) 70 - 99 mg/dL    Comment: Glucose reference range applies only to samples taken after fasting  for at least 8 hours.   Calcium, Ion 0.99 (L) 1.15 - 1.40 mmol/L  TCO2 20 (L) 22 - 32 mmol/L   Hemoglobin 10.2 (L) 12.0 - 15.0 g/dL   HCT 24.4 (L) 01.0 - 27.2 %  Ethanol     Status: None   Collection Time: 07/26/22  7:56 AM  Result Value Ref Range   Alcohol, Ethyl (B) <10 <10 mg/dL    Comment: (NOTE) Lowest detectable limit for serum alcohol is 10 mg/dL.  For medical purposes only. Performed at [facility], [address] [place], [place] [address]   Protime-INR     Status: None   Collection Time: 07/26/22  8:05 AM  Result Value Ref Range   Prothrombin Time 15.0 11.4 - 15.2 seconds   INR 1.2 0.8 - 1.2    Comment: (NOTE) INR goal varies based on device and disease states. Performed at [facility], [address] [place], [place] [address]   APTT     Status: None   Collection Time: 07/26/22  8:05 AM  Result Value Ref Range   aPTT 26 24 - 36 seconds    Comment: Performed at [facility], [address], [place], [place] [address]  Lactic acid, plasma     Status: Abnormal   Collection Time: 07/26/22  8:59 AM  Result Value Ref Range   Lactic Acid, Venous 3.3 (HH) 0.5 - 1.9 mmol/L    Comment: CRITICAL RESULT CALLED TO, READ BACK BY AND VERIFIED WITH FOWLER,D AT 9:20AM ON 07/26/22 BY [place] Performed at [facility], [address] [place], [place] [address]   Blood Culture (routine x 2)     Status: None (Preliminary result)   Collection Time: 07/26/22  8:59 AM   Specimen: Blood  Result Value Ref Range   Specimen Description BLOOD LEFT HAND    Special Requests      BOTTLES DRAWN AEROBIC ONLY Blood Culture results may not be optimal due to an inadequate volume of blood received in culture bottles Performed at [facility], [address] [place], [place] [address]    Culture PENDING    Report Status PENDING   Blood Culture (routine x 2)     Status: None (Preliminary result)   Collection Time: 07/26/22  8:59 AM   Specimen: Blood  Result Value Ref Range   Specimen  Description BLOOD LEFT WRIST    Special Requests      BOTTLES DRAWN AEROBIC ONLY Blood Culture adequate volume Performed at [facility], [address] [place], [place] [address]    Culture PENDING    Report Status PENDING   Troponin I (High Sensitivity)     Status: Abnormal   Collection Time: 07/26/22  8:59 AM  Result Value Ref Range   Troponin I (High Sensitivity) 43 (H) <18 ng/L    Comment: (NOTE) Elevated high sensitivity troponin I (hsTnI) values and significant  changes across serial measurements may suggest ACS but many other  chronic and acute conditions are known to elevate hsTnI results.  Refer to the "Links" section for chest pain algorithms and additional  guidance. Performed at [facility], [address], [place], [place] [address]   Ammonia     Status: None   Collection Time: 07/26/22  9:43 AM  Result Value Ref Range   Ammonia 21 9 - 35 umol/L    Comment: Performed at [facility], [address] [place], [place] [address]  CK     Status: Abnormal   Collection Time: 07/26/22  9:43 AM  Result Value Ref Range   Total CK 413 (H) 38 -  234 U/L    Comment: Performed at [facility], [address] [place], [place] [address]  Lipid panel     Status: Abnormal   Collection Time: 07/26/22  9:44 AM  Result Value Ref Range   Cholesterol 107 0 - 200 mg/dL   Triglycerides 469 <629 mg/dL   HDL 31 (L) >52 mg/dL   Total CHOL/HDL Ratio 3.5 RATIO   VLDL 29 0 - 40 mg/dL   LDL Cholesterol 47 0 - 99 mg/dL    Comment:        Total Cholesterol/HDL:CHD Risk Coronary Heart Disease Risk Table                     Men   Women  1/2 Average Risk   3.4   3.3  Average Risk       5.0   4.4  2 X Average Risk   9.6   7.1  3 X Average Risk  23.4   11.0        Use the calculated Patient Ratio above and the CHD Risk Table to determine the patient's CHD Risk.        ATP III CLASSIFICATION (LDL):  <100     mg/dL   Optimal  841-324  mg/dL   Near or Above                    Optimal   130-159  mg/dL   Borderline  401-027  mg/dL   High  >253     mg/dL   Very High Performed at [facility], [address] [place], [place] [address]   Vitamin B12     Status: None   Collection Time: 07/26/22 10:00 AM  Result Value Ref Range   Vitamin B-12 368 180 - 914 pg/mL    Comment: (NOTE) This assay is not validated for testing neonatal or myeloproliferative syndrome specimens for Vitamin B12 levels. Performed at [facility], [address], [place], [place] [address]   Urine rapid drug screen (hosp performed)     Status: Abnormal   Collection Time: 07/26/22 10:05 AM  Result Value Ref Range   Opiates POSITIVE (A) NONE DETECTED   Cocaine NONE DETECTED NONE DETECTED   Benzodiazepines NONE DETECTED NONE DETECTED   Amphetamines NONE DETECTED NONE DETECTED   Tetrahydrocannabinol NONE DETECTED NONE DETECTED   Barbiturates NONE DETECTED NONE DETECTED    Comment: (NOTE) DRUG SCREEN FOR MEDICAL PURPOSES ONLY.  IF CONFIRMATION IS NEEDED FOR ANY PURPOSE, NOTIFY LAB WITHIN 5 DAYS.  LOWEST DETECTABLE LIMITS FOR URINE DRUG SCREEN Drug Class                     Cutoff (ng/mL) Amphetamine and metabolites    1000 Barbiturate and metabolites    200 Benzodiazepine                 200 Opiates and metabolites        300 Cocaine and metabolites        300 THC                            50 Performed at [facility], [address] [place], [place] [address]   Urinalysis, Routine w reflex microscopic -Urine, Clean Catch     Status: Abnormal   Collection Time: 07/26/22 10:05 AM  Result Value Ref Range   Color, Urine YELLOW YELLOW   APPearance  TURBID (A) CLEAR   Specific Gravity, Urine 1.012 1.005 - 1.030   pH 5.0 5.0 - 8.0   Glucose, UA NEGATIVE NEGATIVE mg/dL   Hgb urine dipstick SMALL (A) NEGATIVE   Bilirubin Urine NEGATIVE NEGATIVE   Ketones, ur NEGATIVE NEGATIVE mg/dL   Protein, ur 161 (A) NEGATIVE mg/dL   Nitrite NEGATIVE NEGATIVE   Leukocytes,Ua SMALL (A) NEGATIVE   RBC / HPF  >50 0 - 5 RBC/hpf   WBC, UA >50 0 - 5 WBC/hpf   Bacteria, UA MANY (A) NONE SEEN   Squamous Epithelial / HPF 0-5 0 - 5 /HPF   WBC Clumps PRESENT    Mucus PRESENT    Budding Yeast PRESENT     Comment: Performed at [facility], [address], [place], [place] [address]  POC CBG, ED     Status: Abnormal   Collection Time: 07/26/22 10:21 AM  Result Value Ref Range   Glucose-Capillary 20 (LL) 70 - 99 mg/dL    Comment: Glucose reference range applies only to samples taken after fasting for at least 8 hours.   Comment 1 Notify RN   Blood gas, venous     Status: Abnormal   Collection Time: 07/26/22 10:26 AM  Result Value Ref Range   pH, Ven 7.3 7.25 - 7.43   pCO2, Ven 42 (L) 44 - 60 mmHg   pO2, Ven <31 (LL) 32 - 45 mmHg    Comment: FOWLER,D AT 11:20AM ON 07/26/22 BY FESTERMAN,C   Bicarbonate 20.7 20.0 - 28.0 mmol/L   Acid-base deficit 5.7 (H) 0.0 - 2.0 mmol/L   O2 Saturation 39.5 %   Patient temperature 36.5    Collection site LEFT ANTECUBITAL    Drawn by [id number]     Comment: Performed at [facility], [address] [place], [place] [address]  Troponin I (High Sensitivity)     Status: Abnormal   Collection Time: 07/26/22 10:30 AM  Result Value Ref Range   Troponin I (High Sensitivity) 43 (H) <18 ng/L    Comment: (NOTE) Elevated high sensitivity troponin I (hsTnI) values and significant  changes across serial measurements may suggest ACS but many other  chronic and acute conditions are known to elevate hsTnI results.  Refer to the "Links" section for chest pain algorithms and additional  guidance. Performed at [facility], [address], [place], [place] [address]   Lactic acid, plasma     Status: Abnormal   Collection Time: 07/26/22 10:33 AM  Result Value Ref Range   Lactic Acid, Venous 2.9 (HH) 0.5 - 1.9 mmol/L    Comment: CRITICAL RESULT CALLED TO, READ BACK BY AND VERIFIED WITH Peyson Postema,B AT 11:10AM ON 07/26/22 BY [facility] Performed at [facility], [address],  [place], [place] [address]   CBG monitoring, ED     Status: Abnormal   Collection Time: 07/26/22 10:51 AM  Result Value Ref Range   Glucose-Capillary 103 (H) 70 - 99 mg/dL    Comment: Glucose reference range applies only to samples taken after fasting for at least 8 hours.  CBG monitoring, ED     Status: Abnormal   Collection Time: 07/26/22 11:07 AM  Result Value Ref Range   Glucose-Capillary 132 (H) 70 - 99 mg/dL    Comment: Glucose reference range applies only to samples taken after fasting for at least 8 hours.  CBG monitoring, ED     Status: Abnormal   Collection Time: 07/26/22  1:06 PM  Result Value Ref Range  Glucose-Capillary 110 (H) 70 - 99 mg/dL    Comment: Glucose reference range applies only to samples taken after fasting for at least 8 hours.   DG Chest Port 1 View  Result Date: 07/26/2022 CLINICAL DATA:  Provided history: Questionable sepsis-evaluate for abnormality. EXAM: PORTABLE CHEST 1 VIEW COMPARISON:  Prior chest radiographs 07/13/2022 and earlier. FINDINGS: Heart size within normal limits. Aortic atherosclerosis. Ill-defined opacity within the left lung base, more conspicuous as compared to the prior examination of 07/13/2022. Subtle ill-defined opacity also present within the lateral aspect of the mid right lung, new from the prior exam. No evidence of pleural effusion or pneumothorax. No acute osseous abnormality identified. Degenerative changes of the spine. IMPRESSION: 1. Ill-defined opacities within the left lung base and mid right lung, which may reflect atelectasis or pneumonia. 2.  Aortic Atherosclerosis (ICD10-I70.0). Electronically Signed   By: Jackey Loge D.O.   On: 07/26/2022 08:47   CT HEAD CODE STROKE WO CONTRAST  Result Date: 07/26/2022 CLINICAL DATA:  Code stroke. 82 year old female with altered mental status. Obtunded. EXAM: CT HEAD WITHOUT CONTRAST TECHNIQUE: Contiguous axial images were obtained from the base of the skull through the vertex without  intravenous contrast. RADIATION DOSE REDUCTION: This exam was performed according to the departmental dose-optimization program which includes automated exposure control, adjustment of the mA and/or kV according to patient size and/or use of iterative reconstruction technique. COMPARISON:  Head CT 03/26/2020. FINDINGS: Brain: Cerebral volume is stable, within normal limits for age. No midline shift, mass effect, or evidence of intracranial mass lesion. No ventriculomegaly. No acute intracranial hemorrhage identified. Linear chronic appearing small infarct in the right cerebellar hemisphere series 2, image 8 is new since 08-05-20.a but nearby punctate appearing chronic right cerebellar infarcts are stable (image 7). Patchy white matter hypodensity appears stable since 2020-08-05, most pronounced in the left corona radiata as before. No cortically based acute infarct identified. And otherwise stable gray-white differentiation. Vascular: No suspicious intracranial vascular hyperdensity. Calcified atherosclerosis at the skull base. Skull: Mild motion artifact. No acute osseous abnormality identified. Sinuses/Orbits: Mild left frontoethmoidal recess mucosal thickening is new. Otherwise Visualized paranasal sinuses and mastoids are stable and well aerated. Other: Slight rightward gaze. Visualized scalp soft tissues are within normal limits. ASPECTS [facility] Stroke Program Early CT Score) Total score (0-10 with 10 being normal): 10 IMPRESSION: 1. No acute cortically based infarct or acute intracranial hemorrhage identified. ASPECTS 10. 2. Mild progression of chronic right cerebellar infarcts since 05-Aug-2020. Study discussed by telephone with Dr. Gloris Manchester on 07/26/2022 at 07:39 . Electronically Signed   By: Odessa Fleming M.D.   On: 07/26/2022 07:39    Pending Labs Unresulted Labs (From admission, onward)     Start     Ordered   07/26/22 0944  RPR  Once,   URGENT        07/26/22 0943   07/26/22 0944  Hemoglobin A1c  Once,   URGENT         07/26/22 0943   07/26/22 0943  Vitamin B1  Once,   URGENT        07/26/22 2956   07/26/22 0819  Urine Culture  Once,   URGENT       Question:  Indication  Answer:  Altered mental status (if no other cause identified)   07/26/22 0818   07/26/22 0818  C Difficile Quick Screen w PCR reflex  (Undifferentiated presentation (screening labs and basic nursing orders))  Once, for 24 hours,   URGENT  References:    CDiff Information Tool   07/26/22 0817   07/26/22 0805  Protime-INR  Once,   R        07/26/22 0805   07/26/22 0805  APTT  Once,   R        07/26/22 0805            Vitals/Pain Today's Vitals   07/26/22 1130 07/26/22 1200 07/26/22 1215 07/26/22 1306  BP: 139/85 120/64 132/63 132/86  Pulse:  82 77 93  Resp: 17 17 19 18   Temp:    (!) 97.2 F (36.2 C)  TempSrc:    Axillary  SpO2:  100% 100% 100%  Weight:      Height:        Isolation Precautions Enteric precautions (UV disinfection)  Medications Medications  cefTRIAXone (ROCEPHIN) 1 g in sodium chloride 0.9 % 100 mL IVPB (0 g Intravenous Stopped 07/26/22 1023)  azithromycin (ZITHROMAX) 500 mg in sodium chloride 0.9 % 250 mL IVPB (500 mg Intravenous New Bag/Given 07/26/22 1243)  lactated ringers bolus 1,000 mL (1,000 mLs Intravenous New Bag/Given 07/26/22 0948)  dextrose 10 % infusion (0 mLs Intravenous Stopped 07/26/22 1132)  dextrose 50 % solution 50 mL (50 mLs Intravenous Given 07/26/22 1028)  dextrose 10 % infusion ( Intravenous New Bag/Given 07/26/22 1133)    Mobility non-ambulatory     Focused Assessments    R Recommendations: See Admitting Provider Note  Report given to:   Additional Notes:

## 2022-07-26 NOTE — Procedures (Signed)
Patient Name: Kim Mills  MRN: [id number]  Epilepsy Attending: Charlsie Quest  Referring Physician/Provider: Shon Hale, MD  Date: 07/26/2022 Duration: 23.39 mins  Patient history: 82 yo F w a PMHX of MM, UTI, HLD, HTN, who presents to the ED with the subacute onset of becoming total-care, not speaking, UTI. Baseline wheelchair bound. Had the acute onset of partial R gaze. EEG to evaluate for seizure  Level of alertness: Awake  AEDs during EEG study: None  Technical aspects: This EEG study was done with scalp electrodes positioned according to the 10-20 International system of electrode placement. Electrical activity was reviewed with band pass filter of 1-70Hz , sensitivity of 7 uV/mm, display speed of 42mm/sec with a 60Hz  notched filter applied as appropriate. EEG data were recorded continuously and digitally stored.  Video monitoring was available and reviewed as appropriate.  Description: No clear posterior dominant rhythm was seen. EEG showed continuous generalized predominantly 5-8hz  theta-alpha activity admixed with intermittent 2-3hz  delta slowing. Hyperventilation and photic stimulation were not performed.    Parts of study were difficult to interpret due to significant myogenic artifact.    ABNORMALITY - Continuous slow, generalized  IMPRESSION: This technically difficult study is suggestive of moderate diffuse encephalopathy, nonspecific etiology. No seizures or epileptiform discharges were seen throughout the recording.  Derrica Sieg Annabelle Harman

## 2022-07-26 NOTE — ED Notes (Signed)
Patient's linens changed and brief placed. In and out cath performed by [facility], Jeremy Johann

## 2022-07-26 NOTE — ED Notes (Signed)
Lab at bedside

## 2022-07-26 NOTE — ED Notes (Signed)
Emokpae, MD at bedside 

## 2022-07-27 ENCOUNTER — Inpatient Hospital Stay (HOSPITAL_COMMUNITY): Payer: Medicare Other

## 2022-07-27 ENCOUNTER — Other Ambulatory Visit: Payer: Self-pay

## 2022-07-27 ENCOUNTER — Other Ambulatory Visit (HOSPITAL_COMMUNITY): Payer: Medicare Other

## 2022-07-27 DIAGNOSIS — E440 Moderate protein-calorie malnutrition: Secondary | ICD-10-CM | POA: Insufficient documentation

## 2022-07-27 DIAGNOSIS — Z4659 Encounter for fitting and adjustment of other gastrointestinal appliance and device: Secondary | ICD-10-CM | POA: Diagnosis not present

## 2022-07-27 DIAGNOSIS — R52 Pain, unspecified: Secondary | ICD-10-CM | POA: Diagnosis not present

## 2022-07-27 DIAGNOSIS — N390 Urinary tract infection, site not specified: Secondary | ICD-10-CM | POA: Diagnosis present

## 2022-07-27 DIAGNOSIS — G9341 Metabolic encephalopathy: Secondary | ICD-10-CM | POA: Diagnosis present

## 2022-07-27 DIAGNOSIS — R404 Transient alteration of awareness: Secondary | ICD-10-CM | POA: Diagnosis not present

## 2022-07-27 DIAGNOSIS — D630 Anemia in neoplastic disease: Secondary | ICD-10-CM | POA: Diagnosis present

## 2022-07-27 DIAGNOSIS — E1140 Type 2 diabetes mellitus with diabetic neuropathy, unspecified: Secondary | ICD-10-CM | POA: Diagnosis present

## 2022-07-27 DIAGNOSIS — E162 Hypoglycemia, unspecified: Secondary | ICD-10-CM | POA: Diagnosis not present

## 2022-07-27 DIAGNOSIS — E1165 Type 2 diabetes mellitus with hyperglycemia: Secondary | ICD-10-CM | POA: Diagnosis present

## 2022-07-27 DIAGNOSIS — R4182 Altered mental status, unspecified: Secondary | ICD-10-CM

## 2022-07-27 DIAGNOSIS — E8809 Other disorders of plasma-protein metabolism, not elsewhere classified: Secondary | ICD-10-CM | POA: Diagnosis present

## 2022-07-27 DIAGNOSIS — G40909 Epilepsy, unspecified, not intractable, without status epilepticus: Secondary | ICD-10-CM | POA: Diagnosis present

## 2022-07-27 DIAGNOSIS — C9000 Multiple myeloma not having achieved remission: Secondary | ICD-10-CM | POA: Diagnosis present

## 2022-07-27 DIAGNOSIS — E1122 Type 2 diabetes mellitus with diabetic chronic kidney disease: Secondary | ICD-10-CM | POA: Diagnosis present

## 2022-07-27 DIAGNOSIS — Z66 Do not resuscitate: Secondary | ICD-10-CM | POA: Diagnosis present

## 2022-07-27 DIAGNOSIS — Z515 Encounter for palliative care: Secondary | ICD-10-CM | POA: Diagnosis not present

## 2022-07-27 DIAGNOSIS — E871 Hypo-osmolality and hyponatremia: Secondary | ICD-10-CM | POA: Diagnosis present

## 2022-07-27 DIAGNOSIS — L8990 Pressure ulcer of unspecified site, unspecified stage: Secondary | ICD-10-CM | POA: Insufficient documentation

## 2022-07-27 DIAGNOSIS — E039 Hypothyroidism, unspecified: Secondary | ICD-10-CM | POA: Diagnosis present

## 2022-07-27 DIAGNOSIS — R569 Unspecified convulsions: Secondary | ICD-10-CM | POA: Diagnosis not present

## 2022-07-27 DIAGNOSIS — E669 Obesity, unspecified: Secondary | ICD-10-CM | POA: Diagnosis present

## 2022-07-27 DIAGNOSIS — N1831 Chronic kidney disease, stage 3a: Secondary | ICD-10-CM | POA: Diagnosis present

## 2022-07-27 DIAGNOSIS — Z7189 Other specified counseling: Secondary | ICD-10-CM | POA: Diagnosis not present

## 2022-07-27 DIAGNOSIS — J189 Pneumonia, unspecified organism: Secondary | ICD-10-CM

## 2022-07-27 DIAGNOSIS — I6389 Other cerebral infarction: Secondary | ICD-10-CM

## 2022-07-27 DIAGNOSIS — I63543 Cerebral infarction due to unspecified occlusion or stenosis of bilateral cerebellar arteries: Secondary | ICD-10-CM | POA: Diagnosis present

## 2022-07-27 DIAGNOSIS — L89153 Pressure ulcer of sacral region, stage 3: Secondary | ICD-10-CM | POA: Diagnosis present

## 2022-07-27 DIAGNOSIS — I63233 Cerebral infarction due to unspecified occlusion or stenosis of bilateral carotid arteries: Secondary | ICD-10-CM | POA: Diagnosis not present

## 2022-07-27 DIAGNOSIS — I129 Hypertensive chronic kidney disease with stage 1 through stage 4 chronic kidney disease, or unspecified chronic kidney disease: Secondary | ICD-10-CM | POA: Diagnosis present

## 2022-07-27 DIAGNOSIS — R29712 NIHSS score 12: Secondary | ICD-10-CM | POA: Diagnosis present

## 2022-07-27 DIAGNOSIS — E11649 Type 2 diabetes mellitus with hypoglycemia without coma: Secondary | ICD-10-CM | POA: Diagnosis present

## 2022-07-27 DIAGNOSIS — D696 Thrombocytopenia, unspecified: Secondary | ICD-10-CM | POA: Diagnosis present

## 2022-07-27 DIAGNOSIS — F0390 Unspecified dementia without behavioral disturbance: Secondary | ICD-10-CM | POA: Diagnosis present

## 2022-07-27 LAB — ECHOCARDIOGRAM COMPLETE
AR max vel: 2.15 cm2
AV Peak grad: 6.7 mmHg
Ao pk vel: 1.29 m/s
Area-P 1/2: 2.9 cm2
Height: 64 in
S' Lateral: 2.1 cm
Weight: 2800.72 oz

## 2022-07-27 LAB — COMPREHENSIVE METABOLIC PANEL
ALT: 24 U/L (ref 0–44)
AST: 26 U/L (ref 15–41)
Albumin: 1.8 g/dL — ABNORMAL LOW (ref 3.5–5.0)
Alkaline Phosphatase: 70 U/L (ref 38–126)
Anion gap: 8 (ref 5–15)
BUN: 15 mg/dL (ref 8–23)
CO2: 18 mmol/L — ABNORMAL LOW (ref 22–32)
Calcium: 7.2 mg/dL — ABNORMAL LOW (ref 8.9–10.3)
Chloride: 106 mmol/L (ref 98–111)
Creatinine, Ser: 1.02 mg/dL — ABNORMAL HIGH (ref 0.44–1.00)
GFR, Estimated: 56 mL/min — ABNORMAL LOW (ref >60)
Glucose, Bld: 255 mg/dL — ABNORMAL HIGH (ref 70–99)
Potassium: 4.1 mmol/L (ref 3.5–5.1)
Sodium: 132 mmol/L — ABNORMAL LOW (ref 135–145)
Total Bilirubin: 0.6 mg/dL (ref 0.3–1.2)
Total Protein: 5.4 g/dL — ABNORMAL LOW (ref 6.5–8.1)

## 2022-07-27 LAB — GLUCOSE, CAPILLARY
Glucose-Capillary: 165 mg/dL — ABNORMAL HIGH (ref 70–99)
Glucose-Capillary: 195 mg/dL — ABNORMAL HIGH (ref 70–99)
Glucose-Capillary: 196 mg/dL — ABNORMAL HIGH (ref 70–99)
Glucose-Capillary: 205 mg/dL — ABNORMAL HIGH (ref 70–99)
Glucose-Capillary: 255 mg/dL — ABNORMAL HIGH (ref 70–99)
Glucose-Capillary: 262 mg/dL — ABNORMAL HIGH (ref 70–99)
Glucose-Capillary: 266 mg/dL — ABNORMAL HIGH (ref 70–99)

## 2022-07-27 LAB — CBC
HCT: 26.6 % — ABNORMAL LOW (ref 36.0–46.0)
Hemoglobin: 8.6 g/dL — ABNORMAL LOW (ref 12.0–15.0)
MCH: 29.6 pg (ref 26.0–34.0)
MCHC: 32.3 g/dL (ref 30.0–36.0)
MCV: 91.4 fL (ref 80.0–100.0)
Platelets: 161 10*3/uL (ref 150–400)
RBC: 2.91 MIL/uL — ABNORMAL LOW (ref 3.87–5.11)
RDW: 15.9 % — ABNORMAL HIGH (ref 11.5–15.5)
WBC: 5.6 10*3/uL (ref 4.0–10.5)
nRBC: 0.4 % — ABNORMAL HIGH (ref 0.0–0.2)

## 2022-07-27 LAB — FOLATE: Folate: 3.9 ng/mL — ABNORMAL LOW (ref >5.9)

## 2022-07-27 LAB — URINE CULTURE

## 2022-07-27 LAB — TSH: TSH: 4.075 u[IU]/mL (ref 0.350–4.500)

## 2022-07-27 LAB — RPR: RPR Ser Ql: NONREACTIVE

## 2022-07-27 LAB — CULTURE, BLOOD (ROUTINE X 2)

## 2022-07-27 MED ORDER — POTASSIUM CL IN DEXTROSE 5% 20 MEQ/L IV SOLN
20.0000 meq | INTRAVENOUS | Status: DC
  Administered 2022-07-27 – 2022-07-29 (×6): 20 meq via INTRAVENOUS
  Filled 2022-07-27 (×7): qty 1000

## 2022-07-27 MED ORDER — STROKE: EARLY STAGES OF RECOVERY BOOK
Freq: Once | Status: AC
  Filled 2022-07-27: qty 1

## 2022-07-27 MED ORDER — MORPHINE SULFATE (PF) 2 MG/ML IV SOLN
1.0000 mg | INTRAVENOUS | Status: DC | PRN
  Administered 2022-08-02 – 2022-08-03 (×3): 1 mg via INTRAVENOUS
  Filled 2022-07-27 (×3): qty 1

## 2022-07-27 MED ORDER — PERFLUTREN LIPID MICROSPHERE
1.0000 mL | INTRAVENOUS | Status: AC | PRN
  Administered 2022-07-27: 2 mL via INTRAVENOUS

## 2022-07-27 MED ORDER — SODIUM CHLORIDE 0.9 % IV SOLN
2.0000 g | INTRAVENOUS | Status: DC
  Administered 2022-07-27 – 2022-08-01 (×6): 2 g via INTRAVENOUS
  Filled 2022-07-27 (×6): qty 20

## 2022-07-27 MED ORDER — MEDIHONEY WOUND/BURN DRESSING EX PSTE
1.0000 | PASTE | Freq: Every day | CUTANEOUS | Status: DC
  Administered 2022-07-27 – 2022-08-03 (×8): 1 via TOPICAL
  Filled 2022-07-27 (×2): qty 44

## 2022-07-27 MED ORDER — IOHEXOL 350 MG/ML SOLN
65.0000 mL | Freq: Once | INTRAVENOUS | Status: AC | PRN
  Administered 2022-07-27: 65 mL via INTRAVENOUS

## 2022-07-27 MED ORDER — VITAMIN B-12 1000 MCG PO TABS: 1000.0000 ug | ORAL_TABLET | Freq: Every day | ORAL | Status: DC

## 2022-07-27 NOTE — Evaluation (Signed)
Clinical/Bedside Swallow Evaluation Patient Details  Name: Kim Mills MRN: [id number] Date of Birth: 1940-12-25  Today's Date: 07/27/2022 Time: SLP Start Time (ACUTE ONLY): 1436 SLP Stop Time (ACUTE ONLY): 1446 SLP Time Calculation (min) (ACUTE ONLY): 10 min  Past Medical History:  Past Medical History:  Diagnosis Date   Arthritis    Cancer (HCC)    Cataract    Chest pain    Associated with weakness and fatigue   Diabetes mellitus    A1c of 7.4 in 08/2010   GERD (gastroesophageal reflux disease)    Glaucoma    Hyperlipidemia    Lipid profile in 08/2010:190, 121, 48, 118; normal CBC and CMet   Hypertension    Lipid profile in 08/2010:190, 121, 48, 118; normal CBC and CMet   Hypothyroidism    Hypothyroidism    Obesity    Past Surgical History:  Past Surgical History:  Procedure Laterality Date   CHOLECYSTECTOMY     COLONOSCOPY N/A 04/01/2014   RMR: Melanosis coli. colonic polyps removed as described above.    CYSTOSCOPY W/ URETERAL STENT PLACEMENT Right 09/13/2019   Procedure: CYSTOSCOPY WITH RIGHT  RETROGRADE PYELOGRAM; BLADDER BIOPSY;  Surgeon: Malen Gauze, MD;  Location: [facility];  Service: Urology;  Laterality: Right;   LEFT HEART CATH AND CORONARY ANGIOGRAPHY N/A 10/07/2017   Procedure: LEFT HEART CATH AND CORONARY ANGIOGRAPHY;  Surgeon: Lennette Bihari, MD;  Location: [facility];  Service: Cardiovascular;  Laterality: N/A;   THYROIDECTOMY, PARTIAL     HPI:  82 yo F presented from home to the ED 6/17 with AMS, right gaze, mutism. Dx hypoglycemia, metabolic encephalopathy, possible pna. MRI: punctate foci of acute ischemia within bilateral cerebellum; multiple old cerebellar infarcts; chronic small vessel ischemia. PMHX of multiple myeloma, HLD, HTN,    Assessment / Plan / Recommendation  Clinical Impression  Pt participated in preliminary swallowing evaluation. Family at bedside. She alerted to name. Accepted tspn boluses of water and ice chips,  demonstrating intermittent oral awareness but consistent, spontaneous swallow response.  Continues with lethargy and did not follow commands.  She vocalized intermittently during session. Continue NPO; cortrak will likely be needed. Spoke with RD who is planning for NG placement next date unless pt's mentation improves. D/W family, who agree with plan.  SLP will follow for swallowing and pending cognitive/linguistic evaluation. SLP Visit Diagnosis: Dysphagia, unspecified (R13.10)    Aspiration Risk    tba   Diet Recommendation   NPO       Other  Recommendations Oral Care Recommendations: Oral care QID    Recommendations for follow up therapy are one component of a multi-disciplinary discharge planning process, led by the attending physician.  Recommendations may be updated based on patient status, additional functional criteria and insurance authorization.  Follow up Recommendations Other (comment) (tba)      Assistance Recommended at Discharge    Functional Status Assessment Patient has had a recent decline in their functional status and demonstrates the ability to make significant improvements in function in a reasonable and predictable amount of time.  Frequency and Duration min 2x/week  2 weeks       Prognosis Prognosis for improved oropharyngeal function: Fair      Swallow Study   General Date of Onset: 07/26/22 HPI: 82 yo F presented from home to the ED 6/17 with AMS, right gaze, mutism. Dx hypoglycemia, metabolic encephalopathy, possible pna. MRI: punctate foci of acute ischemia within bilateral cerebellum; multiple old cerebellar infarcts; chronic  small vessel ischemia. PMHX of multiple myeloma, HLD, HTN, Type of Study: Bedside Swallow Evaluation Previous Swallow Assessment: MBS 12/7.21 age related swallowing changes, regular/thin liquids recommended Diet Prior to this Study: NPO Temperature Spikes Noted: No Respiratory Status: Room air History of Recent Intubation:  No Behavior/Cognition: Lethargic/Drowsy Oral Cavity Assessment: Within Functional Limits Oral Care Completed by SLP: No Oral Cavity - Dentition: Edentulous Self-Feeding Abilities: Total assist Patient Positioning: Upright in bed Baseline Vocal Quality: Normal Volitional Cough: Cognitively unable to elicit Volitional Swallow: Unable to elicit    Oral/Motor/Sensory Function Overall Oral Motor/Sensory Function: Other (comment) (symmetric at baseline)   Ice Chips Ice chips: Impaired Presentation: Spoon Oral Phase Impairments: Reduced labial seal;Reduced lingual movement/coordination Oral Phase Functional Implications: Oral holding Pharyngeal Phase Impairments: Suspected delayed Swallow   Thin Liquid Thin Liquid: Impaired Presentation: Spoon Oral Phase Impairments: Reduced labial seal Oral Phase Functional Implications: Oral holding Pharyngeal  Phase Impairments: Suspected delayed Swallow    Nectar Thick Nectar Thick Liquid: Not tested   Honey Thick Honey Thick Liquid: Not tested   Puree Puree: Not tested   Solid     Solid: Not tested      Blenda Mounts Laurice 07/27/2022,2:56 PM  Marchelle Folks L. Samson Frederic, MA CCC/SLP Clinical Specialist - Acute Care SLP Acute Rehabilitation Services Office number [phone]

## 2022-07-27 NOTE — Assessment & Plan Note (Signed)
Stage III, sacrum, present on admission - WOC nurse consult

## 2022-07-27 NOTE — Progress Notes (Signed)
Progress Note   Patient: Kim Mills:[id number] DOB: 1940-05-12 DOA: 07/26/2022     0 DOS: the patient was seen and examined on 07/27/2022 at 10:08 AM      Brief hospital course: Kim Mills is a 82 y.o. F with MM on Revlimid, DM, obesity, HTN and hypothyroidism who presented with hypoglycemia and confusion.  The week prior had had a UTI treated with Macrobid.  There was some noted somnolence with taking Macrobid, suspected to be in retrospect an interaction with her Revlimid.  Completed Abx and was back to baseline, then on day prior to admission had diarrhea, and on morning of admission was unresponsive.  EMS found her with glucose 30 mg/dL.     Assessment and Plan: * Acute metabolic encephalopathy RPR, TSH, B12 normal.  Folate only minimally low.  UA and CXR both c/w infection, on antibiotics.  Cr, Na, transaminases, ammonia all normal    Neuraxial imaging shows punctate infarcts.  EEG: negative  DDx: Primary components of the DDx are infection and hypoglycemic injury.    - Start dextrose infusion/IVF - Place NG today or tomorrow and start Tube feeds - Continue Rocephin - Consult Neuro, appreciate cares    Hypoglycemia Family reported patient has been eating much less over the last several weeks.  Hypoglycemic to 30s when found by EMS. - Continue dextrose infusion    CAP (community acquired pneumonia) - Continue Rocephin and azithromycin  Malnutrition of moderate degree As evidenced by mild loss of subcutaneous muscle mass and fat, in the setting of multiple myeloma. - Consult dietitian - Start tube feeds  Hyponatremia Mild, not enough to account for her encephalopathy - Continue IV fluids - Monitor sodium  Pressure injury of skin Stage III, sacrum, present on admission - WOC nurse consult  Multiple myeloma not having achieved remission (HCC) Follows with Dr. Ellin Saba, on Revlimid  CKD stage 3a, GFR 45-59 ml/min (HCC) Creatinine stable  within baseline range  Normocytic anemia Due to multiple myeloma.  Note hemoglobin was 11-12 a few months ago.  Hyperlipidemia - Hold simvastatin until able to take p.o.  Hypertension Blood pressure low normal - Hold losartan, metoprolol  Hypothyroidism TSH normal - Continue levothyroxine  Type 2 diabetes mellitus with diabetic neuropathy, unspecified (HCC) Glucose is low - Hold glipizide, metformin - Continue sliding scale corrections          Subjective: Patient is somnolent and poorly responsive.  Overnight no respiratory distress no fever.  No seizures.  Discussed with dietitian and speech therapy about placing core track tomorrow.     Physical Exam: BP (!) 154/57 (BP Location: Left Arm)   Pulse 93   Temp 98.9 F (37.2 C) (Oral)   Resp 18   Ht 5\' 4"  (1.626 m)   Wt 79.4 kg   SpO2 100%   BMI 30.05 kg/m   Elderly adult female, looks under nourished, obtunded, does not make any spontaneous verbalizations or movements, groans occasionally RRR, no murmurs, no peripheral edema Respiratory normal, lungs clear without rales or wheezes Abdomen soft without grimace to palpation     Data Reviewed: EEG negative Lactate 2.9 Hypokalemia was. Patient metabolic panel shows hyponatremia, creatinine 1.0 MRI brain shows punctate infarct LFTs unremarkable CK4 13, insignificant Troponin minimally elevated, no clinical significance Hemoglobin 8.6 Discussed with neurology   Family Communication: Daughter at the bedside    Disposition: Status is: Inpatient Kim Mills was admitted with encephalopathy.  This seems out of proportion for the strokes, also for infection and  dehydration  Concern for hypoglycemic injury.  Overall the prognosis looks exceedingly poor.  If Kim Mills does not improve over the next 24 to 48 hours, we will plan for palliative care consultation tomorrow or Thursday.        Author: Alberteen Sam, MD 07/27/2022 5:59 PM  For on call  review [email or web address].

## 2022-07-27 NOTE — Consult Note (Signed)
Neurology Consultation  Reason for Consult: Altered mental status and acute cerebellar strokes Referring Physician: Dr. Maryfrances Bunnell  CC: None  History is obtained from: Family and chart  HPI: Kim Mills is a 82 y.o. female with history of arthritis, nonambulatory at baseline, diabetes, hypertension, hyperlipidemia, GERD, CAD, neuropathy, CKD stage IIIa and multiple myeloma on oral chemotherapy who presented with recurrent hypoglycemia and altered mental status with right gaze deviation.  According to patient's daughter, who was at bedside, patient went to the beach with her family before [facility] Day and was behaving normally.  At baseline, she does not walk and needs assistance with ADLs including feeding.  Normally, patient is able to hold conversations with family members and can follow commands.  She has had a diminished appetite over the past few weeks and has been started on an appetite stimulating medication by her oncologist.  She was initially diagnosed with a UTI on 6/4, and her chemotherapy has been on hold pending treatment of this.  In the morning of 6/17, patient's daughter woke her up and found that she was unable to speak with altered mental status.  On 6/17, she was also noted to be hypoglycemic twice with blood sugar around 30 at first and then around 20.  Dextrose IV was given, and patient's blood sugar rose appropriately.  EEG reveals no seizure activity, and MRI brain reveals two small acute cerebellar strokes.   LKW: 6/16 evening TNK given?: no, outside of window and completed stroke on MRI IR Thrombectomy? No, exam not consistent with LVO Modified Rankin Scale: 4-Needs assistance to walk and tend to bodily needs  ROS: Unable to obtain due to altered mental status.   Past Medical History:  Diagnosis Date   Arthritis    Cancer [facility])    Cataract    Chest pain    Associated with weakness and fatigue   Diabetes mellitus    A1c of 7.4 in 08/2010   GERD (gastroesophageal  reflux disease)    Glaucoma    Hyperlipidemia    Lipid profile in 08/2010:190, 121, 48, 118; normal CBC and CMet   Hypertension    Lipid profile in 08/2010:190, 121, 48, 118; normal CBC and CMet   Hypothyroidism    Hypothyroidism    Obesity      Family History  Problem Relation Age of Onset   Anemia Father    Arthritis Mother    Stroke Brother    Cancer Sister        unknown kind   Diabetes Sister    Dementia Sister    Cancer Niece        Breast   Cancer Brother    Healthy Son    Healthy Son    Healthy Son    Healthy Daughter    Healthy Daughter    Healthy Daughter    Sarcoidosis Daughter      Social History:   reports that she has never smoked. She has been exposed to tobacco smoke. She has never used smokeless tobacco. She reports that she does not drink alcohol and does not use drugs.  Medications  Current Facility-Administered Medications:    0.9 %  sodium chloride infusion, , Intravenous, PRN, Emokpae, Courage, MD   acetaminophen (TYLENOL) tablet 650 mg, 650 mg, Oral, Q6H PRN **OR** acetaminophen (TYLENOL) suppository 650 mg, 650 mg, Rectal, Q6H PRN, Emokpae, Courage, MD   albuterol (PROVENTIL) (2.5 MG/3ML) 0.083% nebulizer solution 2.5 mg, 2.5 mg, Nebulization, Q2H PRN, Shon Hale, MD  aspirin EC tablet 81 mg, 81 mg, Oral, Q breakfast, Emokpae, Courage, MD   azithromycin (ZITHROMAX) 500 mg in sodium chloride 0.9 % 250 mL IVPB, 500 mg, Intravenous, Q24H, Emokpae, Courage, MD, Last Rate: 250 mL/hr at 07/26/22 2044, 500 mg at 07/26/22 2044   bisacodyl (DULCOLAX) suppository 10 mg, 10 mg, Rectal, Daily PRN, Mariea Clonts, Courage, MD   cefTRIAXone (ROCEPHIN) 1 g in sodium chloride 0.9 % 100 mL IVPB, 1 g, Intravenous, Q24H, Emokpae, Courage, MD, Last Rate: 200 mL/hr at 07/26/22 1908, 1 g at 07/26/22 1908   cyanocobalamin (VITAMIN B12) tablet 1,000 mcg, 1,000 mcg, Oral, Daily, de Saintclair Halsted, Cortney E, NP   dextrose 10 % infusion, , Intravenous, Continuous, Emokpae,  Courage, MD, Stopped at 07/27/22 0549   fluconazole (DIFLUCAN) tablet 200 mg, 200 mg, Oral, Daily, Emokpae, Courage, MD   gabapentin (NEURONTIN) capsule 300 mg, 300 mg, Oral, BID, Emokpae, Courage, MD   heparin injection 5,000 Units, 5,000 Units, Subcutaneous, Q8H, Emokpae, Courage, MD, 5,000 Units at 07/27/22 0543   hydrALAZINE (APRESOLINE) injection 10 mg, 10 mg, Intravenous, Q6H PRN, Mariea Clonts, Courage, MD   HYDROcodone-acetaminophen (NORCO/VICODIN) 5-325 MG per tablet 1 tablet, 1 tablet, Oral, Q6H PRN, Emokpae, Courage, MD   insulin aspart (novoLOG) injection 0-6 Units, 0-6 Units, Subcutaneous, TID WC, Emokpae, Courage, MD   leptospermum manuka honey (MEDIHONEY) paste 1 Application, 1 Application, Topical, Daily, Danford, Earl Lites, MD   levothyroxine (SYNTHROID) tablet 137 mcg, 137 mcg, Oral, Q0600, Mariea Clonts, Courage, MD   meclizine (ANTIVERT) tablet 25 mg, 25 mg, Oral, Q6H PRN, Emokpae, Courage, MD   megestrol (MEGACE) 400 MG/10ML suspension 400 mg, 400 mg, Oral, BID, Emokpae, Courage, MD   ondansetron (ZOFRAN) tablet 4 mg, 4 mg, Oral, Q6H PRN **OR** ondansetron (ZOFRAN) injection 4 mg, 4 mg, Intravenous, Q6H PRN, Emokpae, Courage, MD   pantoprazole (PROTONIX) EC tablet 40 mg, 40 mg, Oral, Daily, Emokpae, Courage, MD   polyethylene glycol (MIRALAX / GLYCOLAX) packet 17 g, 17 g, Oral, Daily, Emokpae, Courage, MD   polyethylene glycol (MIRALAX / GLYCOLAX) packet 17 g, 17 g, Oral, Daily PRN, Emokpae, Courage, MD   sodium chloride flush (NS) 0.9 % injection 3 mL, 3 mL, Intravenous, Q12H, Emokpae, Courage, MD, 3 mL at 07/26/22 2047   sodium chloride flush (NS) 0.9 % injection 3 mL, 3 mL, Intravenous, Q12H, Emokpae, Courage, MD, 3 mL at 07/26/22 2047   sodium chloride flush (NS) 0.9 % injection 3 mL, 3 mL, Intravenous, PRN, Mariea Clonts, Courage, MD   Exam: Current vital signs: BP (!) 165/69 (BP Location: Left Arm)   Pulse (!) 106   Temp 99.3 F (37.4 C) (Oral)   Resp 17   Ht 5\' 4"  (1.626 m)    Wt 79.4 kg   SpO2 100%   BMI 30.05 kg/m  Vital signs in last 24 hours: Temp:  [97.2 F (36.2 C)-99.3 F (37.4 C)] 99.3 F (37.4 C) (06/18 0800) Pulse Rate:  [77-106] 106 (06/18 0800) Resp:  [16-20] 17 (06/18 0800) BP: (103-165)/(52-86) 165/69 (06/18 0800) SpO2:  [98 %-100 %] 100 % (06/18 0800)  GENERAL: Ill appearing patient in no acute distress Head: Normocephalic and atraumatic, without obvious abnormality EENT: Normal conjunctivae, moist mucous membranes, no OP obstruction LUNGS: Normal respiratory effort. Non-labored breathing on room air Extremities: warm, well perfused, without obvious deformity  NEURO:  Patient is unable to follow commands and is nonverbal.  She does not respond to name but will move bilateral upper extremities to touch.  She will open eyes  to stimulation and will regard examiner and sister in the room with extraocular movements intact.  Face appears symmetrical, and patient can move bilateral upper extremities spontaneously and on purposefully.  She does not lift upper or lower extremities off the bed she will move bilateral feet to touching of sole of foot.  NIHSS: 1a Level of Conscious.: 2 1b LOC Questions: 2 1c LOC Commands: 2 2 Best Gaze: 0 3 Visual: 0 4 Facial Palsy: 0 5a Motor Arm - left: 3 5b Motor Arm - Right: 3 6a Motor Leg - Left: 3 6b Motor Leg - Right: 3 7 Limb Ataxia: 0 8 Sensory: 1 9 Best Language: 3 10 Dysarthria: 2 11 Extinct. and Inatten.: 0 TOTAL: 24   Labs I have reviewed labs in epic and the results pertinent to this consultation are:   CBC    Component Value Date/Time   WBC 5.6 07/27/2022 0417   RBC 2.91 (L) 07/27/2022 0417   HGB 8.6 (L) 07/27/2022 0417   HCT 26.6 (L) 07/27/2022 0417   PLT 161 07/27/2022 0417   MCV 91.4 07/27/2022 0417   MCH 29.6 07/27/2022 0417   MCHC 32.3 07/27/2022 0417   RDW 15.9 (H) 07/27/2022 0417   LYMPHSABS 1.5 07/26/2022 0740   MONOABS 0.4 07/26/2022 0740   EOSABS 0.1 07/26/2022 0740    BASOSABS 0.0 07/26/2022 0740    CMP     Component Value Date/Time   NA 132 (L) 07/27/2022 0417   NA 137 09/24/2020 1517   K 4.1 07/27/2022 0417   CL 106 07/27/2022 0417   CO2 18 (L) 07/27/2022 0417   GLUCOSE 255 (H) 07/27/2022 0417   BUN 15 07/27/2022 0417   BUN 21 09/24/2020 1517   CREATININE 1.02 (H) 07/27/2022 0417   CREATININE 0.88 03/19/2020 0936   CALCIUM 7.2 (L) 07/27/2022 0417   PROT 5.4 (L) 07/27/2022 0417   ALBUMIN 1.8 (L) 07/27/2022 0417   AST 26 07/27/2022 0417   ALT 24 07/27/2022 0417   ALKPHOS 70 07/27/2022 0417   BILITOT 0.6 07/27/2022 0417   GFRNONAA 56 (L) 07/27/2022 0417   GFRNONAA 41 (L) 04/13/2019 1241   GFRAA >60 11/12/2019 0950   GFRAA 48 (L) 04/13/2019 1241    Lipid Panel     Component Value Date/Time   CHOL 107 07/26/2022 0944   TRIG 143 07/26/2022 0944   HDL 31 (L) 07/26/2022 0944   CHOLHDL 3.5 07/26/2022 0944   VLDL 29 07/26/2022 0944   LDLCALC 47 07/26/2022 0944   LDLCALC 97 04/13/2019 1241  Urinalysis    Component Value Date/Time   COLORURINE YELLOW 07/26/2022 1005   APPEARANCEUR TURBID (A) 07/26/2022 1005   LABSPEC 1.012 07/26/2022 1005   PHURINE 5.0 07/26/2022 1005   GLUCOSEU NEGATIVE 07/26/2022 1005   HGBUR SMALL (A) 07/26/2022 1005   BILIRUBINUR NEGATIVE 07/26/2022 1005   BILIRUBINUR neg 08/27/2019 1455   KETONESUR NEGATIVE 07/26/2022 1005   PROTEINUR 100 (A) 07/26/2022 1005   UROBILINOGEN 2.0 (A) 08/27/2019 1455   NITRITE NEGATIVE 07/26/2022 1005   LEUKOCYTESUR SMALL (A) 07/26/2022 1005     B12 368 UDS positive for prescribed opiates RPR nonreactive Thiamine pending Ammonia 21  Imaging I have reviewed the images obtained:  CT-scan of the brain: No acute abnormality  MRI examination of the brain: Punctate foci of acute ischemia within both cerebellar hemispheres, multiple old cerebellar infarcts and chronic small vessel ischemia  EEG: Continuous slow, generalized, suggestive of moderate diffuse  encephalopathy  Assessment: 82 year old patient with history  of arthritis, nonambulatory at baseline, diabetes, hypertension, hyperlipidemia, GERD, CAD, neuropathy, CKD stage IIIa and multiple myeloma on oral chemotherapy presented with recurrent hypoglycemia and altered mental status with right gaze deviation.  She was diagnosed with a UTI on 6/4 and has undergone treatment, but UA this admission was still positive for UTI, and she is on appropriate antibiotics.  Per family, patient is normally dependent for ADLs but is able to hold conversations at baseline.  She has had a decreased appetite over the last few weeks and will only eat a few bites of what ever food is given to her.  On the morning of 6/17, when she was woken up by her daughter, she was unable to speak or follow commands.  She was noted to be hypoglycemic twice with sugars in the 20s and 30s and responded appropriately to IV dextrose, but mental status did not improve with this.    Suspect that toxic metabolic encephalopathy in the setting of UTI is the most likely cause of patient's altered mental status.   There may also be a component of hypoglycemic brain injury, which is not always detectable on MRI; focal findings can also be triggered by hypo or hyper glycemia  B12 was 368, as optimal level is greater than 400, will begin supplementation.  Will await results of thiamine and folate.  Nutritional deficiency in the setting of poor p.o. intake could certainly be contributing to patient's altered mental status.    EEG was negative for seizure activity, but have low threshold to repeat if right gaze deviation is recurrent.    Bilateral cerebellar strokes that seem to be an incidental finding, as they would not explain patient's altered mental status.  Etiology of strokes is likely hypercoagulability in the setting of multiple myeloma vs. cardioembolic source.  Given this and given family's report of gradual decline, palliative care  consult to clarify goals of care and further course of treatment may be helpful.  Impression: Altered mental status due to toxic metabolic encephalopathy in patient with UTI, multiple myeloma who also has 2 small cerebellar strokes   Recommendations: - Treatment of UTI per primary team - Begin supplementing B12 with 1000 mcg daily - Await remaining nutritional labs, thiamine, folate, TSH - Likely outside of window for permissive hypertension in setting of stroke, as no definite stroke symptoms are seen - CTA head and neck - TTE w/ bubble - Check A1c and LDL + add statin per guidelines - Aspirin 81 mg daily antiplt - q4 hr neuro checks - STAT head CT for any change in neuro exam - Tele - PT/OT/SLP - Stroke education - Amb referral to neurology upon discharge   - Consider palliative care consult given gradual decline in patient with multiple myeloma  Pt seen by NP/Neuro and later by MD. Note/plan to be edited by MD as needed.  Cortney E Ernestina Columbia , MSN, AGACNP-BC [facility] See Amion for schedule and pager information 07/27/2022 9:45 AM    Attending Neurologist's note:  Discussed in detail with family my concern for hypoglycemic brain injury which on a background of cognitive decline may not be something she recovers much from. However, will treat the treatable at this time.   I personally saw this patient, gathering history, performing a full neurologic examination, reviewing relevant labs, personally reviewing relevant imaging including MRI brain, and formulated the assessment and plan, adding the note above for completeness and clarity to accurately reflect my thoughts  Brooke Dare MD-PhD [facility]  [phone] Available 7 AM to 7 PM, outside these hours please contact Neurologist on call listed on AMION

## 2022-07-27 NOTE — Assessment & Plan Note (Addendum)
RPR, TSH, B12 normal.  Folate only minimally low.  UA and CXR both c/w infection, on antibiotics.  Cr, Na, transaminases, ammonia all normal    Neuraxial imaging shows punctate infarcts.  EEG: negative  DDx: Primary components of the DDx are infection and hypoglycemic injury.    - Start dextrose infusion/IVF - Place NG today or tomorrow and start Tube feeds - Continue Rocephin - Consult Neuro, appreciate cares

## 2022-07-27 NOTE — Assessment & Plan Note (Signed)
TSH normal ?- Continue levothyroxine ?

## 2022-07-27 NOTE — Assessment & Plan Note (Signed)
Follows with Dr. Ellin Saba, on Revlimid

## 2022-07-27 NOTE — Assessment & Plan Note (Signed)
-   Hold simvastatin until able to take p.o.

## 2022-07-27 NOTE — Assessment & Plan Note (Signed)
Mild, not enough to account for her encephalopathy - Continue IV fluids - Monitor sodium

## 2022-07-27 NOTE — Assessment & Plan Note (Signed)
Glucose is low - Hold glipizide, metformin - Continue sliding scale corrections

## 2022-07-27 NOTE — Assessment & Plan Note (Signed)
Blood pressure low normal - Hold losartan, metoprolol

## 2022-07-27 NOTE — Assessment & Plan Note (Signed)
-   Continue Rocephin and azithromycin   

## 2022-07-27 NOTE — Progress Notes (Signed)
Initial Nutrition Assessment  DOCUMENTATION CODES:   Non-severe (moderate) malnutrition in context of chronic illness  INTERVENTION:  Monitor SLP evaluation for diet advancement Cortrak placement in the AM 6/18 Once in place, initiate tube feeding via cortrak tube: Osmolite 1.5 at 50 ml/h (1200 ml per day) Start at 78mL/h and advance by 10mL 8 hours to goal of 50 Free water flush q4h Prosource TF20 60 ml 1x/d Provides 1880 kcal, 95 gm protein, 914 ml free water daily ( TF+flush)  NUTRITION DIAGNOSIS:   Moderate Malnutrition related to chronic illness as evidenced by mild muscle depletion, mild fat depletion.  GOAL:   Patient will meet greater than or equal to 90% of their needs  MONITOR:   Diet advancement, TF tolerance, Labs, Weight trends  REASON FOR ASSESSMENT:   Malnutrition Screening Tool    ASSESSMENT:   Pt with hx of DM type 2, HTN, HLD, GERD, CAD, CKD, hx of cholecystectomy and multiple myeloma undergoing treatment presented to ED with AMS. Recently completed antibiotics for UTI.  Patient is wheelchair bound at baseline.   Patient resting in bed at the time of assessment minimally responsive and unable to provide a hx at this time. Several family members at bedside able to provide a history. States that pt normally needs a little assistance with ADLs but that she has been more dependent for the last few weeks. Also reports that her intake has declined in the last few weeks.  NPO due to mental status. SLP to see, but likely will not be able to have diet advanced.   Discussed with MD, will place order for cortrak tube to provide nutrition and enteral access until mental status improves.   Nutritionally Relevant Medications: Scheduled Meds:  insulin aspart  0-6 Units Subcutaneous TID WC   megestrol  400 mg Oral BID   pantoprazole  40 mg Oral Daily   polyethylene glycol  17 g Oral Daily   Continuous Infusions:  sodium chloride     azithromycin 500  mg (07/26/22 2044)   cefTRIAXone (ROCEPHIN)  IV 1 g (07/26/22 1908)   dextrose Stopped (07/27/22 0549)   PRN Meds: bisacodyl, meclizine, ondansetron, polyethylene glycol  Labs Reviewed: Na 132 Creatinine 1.02 CBG ranges from 20-262 mg/dL over the last 24 hours HgbA1c 6.4%  NUTRITION - FOCUSED PHYSICAL EXAM: Flowsheet Row Most Recent Value  Orbital Region Mild depletion  Upper Arm Region Moderate depletion  Thoracic and Lumbar Region Moderate depletion  Buccal Region Mild depletion  Temple Region Mild depletion  Clavicle Bone Region Moderate depletion  Clavicle and Acromion Bone Region Mild depletion  Scapular Bone Region Mild depletion  Dorsal Hand Moderate depletion  Patellar Region Severe depletion  Anterior Thigh Region Severe depletion  Posterior Calf Region Severe depletion  Edema (RD Assessment) None  Hair Reviewed  Eyes Reviewed  Mouth Reviewed  Skin Reviewed  Nails Reviewed    Diet Order:   Diet Order             Diet NPO time specified  Diet effective now                   EDUCATION NEEDS:  Not appropriate for education at this time  Skin:  Skin Assessment: Reviewed RN Assessment  Last BM:  unsure  Height:   Ht Readings from Last 1 Encounters:  07/26/22 5\' 4"  (1.626 m)    Weight:   Wt Readings from Last 1 Encounters:  07/26/22 79.4 kg    Ideal Body  Weight:  54.5 kg  BMI:  Body mass index is 30.05 kg/m.  Estimated Nutritional Needs:   Kcal:  1700-1900 kcal/d  Protein:  70-90g/d  Fluid:  >1.8 L/d    Greig Castilla, RD, LDN Clinical Dietitian RD pager # available in AMION  After hours/weekend pager # available in [facility] [place]

## 2022-07-27 NOTE — Assessment & Plan Note (Signed)
Creatinine stable within baseline range

## 2022-07-27 NOTE — Consult Note (Signed)
WOC Nurse Consult Note: Reason for Consult:Healing Unstageable pressure injury to coccyx. In treatment by [facility] for 4 weeks according to daughter in room. Wound type:pressure Pressure Injury POA: Yes Measurement: Two lesions, central lesion measures 4cm x 2.4cm x 0.2cm with 1cm x 0.4cm round piece of eschar dissolving via autolysis. Lesion to left of central measures 3cm x 2cm x 0.1cm with yellow fibrinous wound bed. Both wounds show evidence of contracting and healing. Wound bed:As described above Drainage (amount, consistency, odor) Small light yellow consistent with autolytically dissolving nonviable tissue Periwound: intact with evidence of previous wound healing (scarring) Dressing procedure/placement/frequency: Patient is being turned and repositioned I have added guidance for minimizing time in the supine position. Bilateral pressure redistribution heel boots are provided. Topical care daily with NS cleanse followed by application of Medihoney (leptospermum [place] honey) topped with dry gauze and secured with silicone foam is ordered. A pressure redistribution chair pad is provided for out of bed use and is to be sent with patient at timer of discharge.  WOC nursing team will not follow, but will remain available to this patient, the nursing and medical teams.  Please re-consult if needed.  Thank you for inviting [place] to participate in this patient's Plan of Care.  Ladona Mow, MSN, RN, CNS, GNP, Leda Min, [employer], [employer] phone:  [phone]

## 2022-07-27 NOTE — Assessment & Plan Note (Signed)
Family reported patient has been eating much less over the last several weeks.  Hypoglycemic to 30s when found by EMS. - Continue dextrose infusion

## 2022-07-27 NOTE — Hospital Course (Signed)
Kim Mills is a 82 y.o. F with MM on Revlimid, DM, obesity, HTN and hypothyroidism who presented with hypoglycemia and confusion.  The week prior had had a UTI treated with Macrobid.  There was some noted somnolence with taking Macrobid, suspected to be in retrospect an interaction with her Revlimid.  Completed Abx and was back to baseline, then on day prior to admission had diarrhea, and on morning of admission was unresponsive.  EMS found her with glucose 30 mg/dL.

## 2022-07-27 NOTE — Assessment & Plan Note (Signed)
Due to multiple myeloma.  Note hemoglobin was 11-12 a few months ago.

## 2022-07-27 NOTE — Assessment & Plan Note (Signed)
As evidenced by mild loss of subcutaneous muscle mass and fat, in the setting of multiple myeloma. - Consult dietitian - Start tube feeds

## 2022-07-28 ENCOUNTER — Inpatient Hospital Stay (HOSPITAL_COMMUNITY): Payer: Medicare Other

## 2022-07-28 DIAGNOSIS — E162 Hypoglycemia, unspecified: Secondary | ICD-10-CM | POA: Diagnosis not present

## 2022-07-28 DIAGNOSIS — Z515 Encounter for palliative care: Secondary | ICD-10-CM

## 2022-07-28 DIAGNOSIS — J189 Pneumonia, unspecified organism: Secondary | ICD-10-CM

## 2022-07-28 DIAGNOSIS — G9341 Metabolic encephalopathy: Secondary | ICD-10-CM | POA: Diagnosis not present

## 2022-07-28 DIAGNOSIS — F0390 Unspecified dementia without behavioral disturbance: Secondary | ICD-10-CM | POA: Diagnosis not present

## 2022-07-28 DIAGNOSIS — Z7189 Other specified counseling: Secondary | ICD-10-CM | POA: Diagnosis not present

## 2022-07-28 DIAGNOSIS — C9000 Multiple myeloma not having achieved remission: Secondary | ICD-10-CM

## 2022-07-28 DIAGNOSIS — N390 Urinary tract infection, site not specified: Secondary | ICD-10-CM | POA: Diagnosis present

## 2022-07-28 DIAGNOSIS — R569 Unspecified convulsions: Secondary | ICD-10-CM | POA: Diagnosis not present

## 2022-07-28 LAB — COMPREHENSIVE METABOLIC PANEL
ALT: 25 U/L (ref 0–44)
AST: 19 U/L (ref 15–41)
Albumin: 1.9 g/dL — ABNORMAL LOW (ref 3.5–5.0)
Alkaline Phosphatase: 66 U/L (ref 38–126)
Anion gap: 8 (ref 5–15)
BUN: 7 mg/dL — ABNORMAL LOW (ref 8–23)
CO2: 17 mmol/L — ABNORMAL LOW (ref 22–32)
Calcium: 7.3 mg/dL — ABNORMAL LOW (ref 8.9–10.3)
Chloride: 109 mmol/L (ref 98–111)
Creatinine, Ser: 1 mg/dL (ref 0.44–1.00)
GFR, Estimated: 57 mL/min — ABNORMAL LOW (ref >60)
Glucose, Bld: 220 mg/dL — ABNORMAL HIGH (ref 70–99)
Potassium: 3.9 mmol/L (ref 3.5–5.1)
Sodium: 134 mmol/L — ABNORMAL LOW (ref 135–145)
Total Bilirubin: 0.3 mg/dL (ref 0.3–1.2)
Total Protein: 5.5 g/dL — ABNORMAL LOW (ref 6.5–8.1)

## 2022-07-28 LAB — GLUCOSE, CAPILLARY
Glucose-Capillary: 189 mg/dL — ABNORMAL HIGH (ref 70–99)
Glucose-Capillary: 207 mg/dL — ABNORMAL HIGH (ref 70–99)
Glucose-Capillary: 215 mg/dL — ABNORMAL HIGH (ref 70–99)
Glucose-Capillary: 217 mg/dL — ABNORMAL HIGH (ref 70–99)
Glucose-Capillary: 227 mg/dL — ABNORMAL HIGH (ref 70–99)
Glucose-Capillary: 250 mg/dL — ABNORMAL HIGH (ref 70–99)

## 2022-07-28 LAB — URINE CULTURE: Culture: >=100000 — AB

## 2022-07-28 LAB — CBC
HCT: 26.2 % — ABNORMAL LOW (ref 36.0–46.0)
Hemoglobin: 8.4 g/dL — ABNORMAL LOW (ref 12.0–15.0)
MCH: 28.9 pg (ref 26.0–34.0)
MCHC: 32.1 g/dL (ref 30.0–36.0)
MCV: 90 fL (ref 80.0–100.0)
Platelets: 152 10*3/uL (ref 150–400)
RBC: 2.91 MIL/uL — ABNORMAL LOW (ref 3.87–5.11)
RDW: 16.1 % — ABNORMAL HIGH (ref 11.5–15.5)
WBC: 6.5 10*3/uL (ref 4.0–10.5)
nRBC: 0 % (ref 0.0–0.2)

## 2022-07-28 LAB — CULTURE, BLOOD (ROUTINE X 2): Culture: NO GROWTH

## 2022-07-28 MED ORDER — FREE WATER
100.0000 mL | Status: DC
  Administered 2022-07-28 – 2022-08-02 (×30): 100 mL

## 2022-07-28 MED ORDER — CLOPIDOGREL BISULFATE 75 MG PO TABS
75.0000 mg | ORAL_TABLET | Freq: Every day | ORAL | Status: DC
  Administered 2022-07-28 – 2022-07-30 (×3): 75 mg via ORAL
  Filled 2022-07-28 (×4): qty 1

## 2022-07-28 MED ORDER — OSMOLITE 1.2 CAL PO LIQD
1000.0000 mL | ORAL | Status: DC
Start: 2022-07-28 — End: 2022-07-28

## 2022-07-28 MED ORDER — CYANOCOBALAMIN 1000 MCG/ML IJ SOLN
1000.0000 ug | Freq: Every day | INTRAMUSCULAR | Status: DC
  Administered 2022-07-28 – 2022-08-01 (×5): 1000 ug via INTRAMUSCULAR
  Filled 2022-07-28 (×5): qty 1

## 2022-07-28 MED ORDER — INSULIN ASPART 100 UNIT/ML IJ SOLN
0.0000 [IU] | Freq: Three times a day (TID) | INTRAMUSCULAR | Status: DC
  Administered 2022-07-28: 2 [IU] via SUBCUTANEOUS
  Administered 2022-07-28: 3 [IU] via SUBCUTANEOUS
  Administered 2022-07-29: 5 [IU] via SUBCUTANEOUS
  Administered 2022-07-29: 3 [IU] via SUBCUTANEOUS
  Administered 2022-07-29: 5 [IU] via SUBCUTANEOUS
  Administered 2022-07-30: 3 [IU] via SUBCUTANEOUS
  Administered 2022-07-30 (×2): 5 [IU] via SUBCUTANEOUS
  Administered 2022-07-31: 3 [IU] via SUBCUTANEOUS
  Administered 2022-07-31: 5 [IU] via SUBCUTANEOUS
  Administered 2022-07-31: 3 [IU] via SUBCUTANEOUS
  Administered 2022-08-01 (×2): 5 [IU] via SUBCUTANEOUS
  Administered 2022-08-01: 7 [IU] via SUBCUTANEOUS
  Administered 2022-08-02: 5 [IU] via SUBCUTANEOUS

## 2022-07-28 MED ORDER — FOLIC ACID 5 MG/ML IJ SOLN
1.0000 mg | Freq: Every day | INTRAMUSCULAR | Status: DC
  Administered 2022-07-28 – 2022-08-02 (×6): 1 mg via INTRAVENOUS
  Filled 2022-07-28 (×6): qty 0.2

## 2022-07-28 MED ORDER — OSMOLITE 1.5 CAL PO LIQD
1000.0000 mL | ORAL | Status: DC
  Administered 2022-07-28 – 2022-08-01 (×7): 1000 mL
  Filled 2022-07-28: qty 1000

## 2022-07-28 MED ORDER — INSULIN ASPART 100 UNIT/ML IJ SOLN
0.0000 [IU] | Freq: Every day | INTRAMUSCULAR | Status: DC
  Administered 2022-07-28 – 2022-07-29 (×2): 2 [IU] via SUBCUTANEOUS
  Administered 2022-07-30: 3 [IU] via SUBCUTANEOUS
  Administered 2022-07-31 – 2022-08-01 (×2): 2 [IU] via SUBCUTANEOUS

## 2022-07-28 MED ORDER — ASPIRIN 81 MG PO CHEW
81.0000 mg | CHEWABLE_TABLET | Freq: Every day | ORAL | Status: DC
  Administered 2022-07-28: 81 mg via ORAL
  Filled 2022-07-28: qty 1

## 2022-07-28 MED ORDER — PROSOURCE TF20 ENFIT COMPATIBL EN LIQD
60.0000 mL | Freq: Every day | ENTERAL | Status: DC
  Administered 2022-07-28 – 2022-08-02 (×6): 60 mL
  Filled 2022-07-28 (×6): qty 60

## 2022-07-28 MED ORDER — SIMVASTATIN 20 MG PO TABS
40.0000 mg | ORAL_TABLET | Freq: Every day | ORAL | Status: DC
  Administered 2022-07-28 – 2022-07-30 (×3): 40 mg via ORAL
  Filled 2022-07-28 (×3): qty 2

## 2022-07-28 NOTE — Inpatient Diabetes Management (Signed)
Inpatient Diabetes Program Recommendations  AACE/ADA: New Consensus Statement on Inpatient Glycemic Control (2015)  Target Ranges:  Prepandial:   less than 140 mg/dL      Peak postprandial:   less than 180 mg/dL (1-2 hours)      Critically ill patients:  140 - 180 mg/dL   Lab Results  Component Value Date   GLUCAP 227 (H) 07/28/2022   HGBA1C 6.4 (H) 07/26/2022    Review of Glycemic Control  Latest Reference Range & Units 07/27/22 05:40 07/27/22 09:01 07/27/22 12:38 07/27/22 17:07 07/27/22 18:01 07/27/22 21:37 07/28/22 06:24 07/28/22 07:36  Glucose-Capillary 70 - 99 mg/dL 409 (H) 811 (H) 914 (H) 255 (H)  Novolog 2 units 266 (H) 205 (H) 207 (H) 227 (H)   Diabetes history: DM 2 Outpatient Diabetes medications:  Decadron 20 mg QTuesday Glipizide 5 mg bid Metformin 1000 mg bid Current orders for Inpatient glycemic control:  Novolog 0-6 units tid  Inpatient Diabetes Program Recommendations:    -   Consider increasing Novolog correction scale to 0-9 units tid + hs scale  Thanks,  Christena Deem RN, MSN, BC-ADM Inpatient Diabetes Coordinator Team Pager [phone] (8a-5p)

## 2022-07-28 NOTE — Progress Notes (Signed)
Nutrition Brief Note  Patient now s/p placement of 10 Fr. Cortrak tube in right nare today. Tip of tube terminates in distal stomach per abdominal x-ray 6/19. Also received consult for initiation of tube feeds per MD. Will order tube feeds per recommendations left on 07/27/22. See RD note from 6/18 for full initial assessment.  Initiate tube feeds via Cortrak: -Initiate Osmolite 1.5 at 20 mL/hour and advance by 10 mL/hour every 8 hours to goal rate of 50 mL/hour (1200 mL per day) -Provide PROSource TF20 60 mL daily -Free water flush 100 mL Q4hours -Provides 1880 kcal, 95 gm protein, 914 ml free water daily ( TF+flush)   Kim Median, MS, RD, LDN, CNSC Pager number available on Amion

## 2022-07-28 NOTE — TOC CM/SW Note (Signed)
Transition of Care [facility]) - Inpatient Brief Assessment   Patient Details  Name: Kim Mills MRN: [id number] Date of Birth: 1940/02/27  Transition of Care [facility]) CM/SW Contact:    Baldemar Lenis, LCSW Phone Number: 07/28/2022, 3:48 PM   Clinical Narrative:   CSW noting SNF recommendation from PT/OT with GOC pending. Cortrak placed today, patient not following commands. CSW to follow for disposition when appropriate.    Transition of Care Asessment: Insurance and Status: Insurance coverage has been reviewed Patient has primary care physician: Yes Home environment has been reviewed: Yes Prior level of function:: Needs assist Prior/Current Home Services: No current home services Social Determinants of Health Reivew: SDOH reviewed no interventions necessary Readmission risk has been reviewed: Yes Transition of care needs: transition of care needs identified, TOC will continue to follow

## 2022-07-28 NOTE — Progress Notes (Signed)
Speech Language Pathology Treatment: Dysphagia  Patient Details Name: DARNIKA SOBEL MRN: [id number] DOB: Aug 29, 1940 Today's Date: 07/28/2022 Time: 4098-1191 SLP Time Calculation (min) (ACUTE ONLY): 12 min  Assessment / Plan / Recommendation Clinical Impression  Pt generally somnolent this morning.Her son, Zoe Lan, was at the bedside.  Pt repositioned to upright; lights turned on, face washed without eliciting a purposeful response despite max multimodal cues.  Pt was retaining frothy oral secretions, releasing them onto face/chin without awareness. Provided oral care followed by trial of tspn cold water. No spontaneous swallowing was observed today.  Secretions/water removed from oral cavity with [place].   Plan is to proceed with cortrak today. SLP will follow for improvements in MS that will allow PO intake and speech/language assessment.  Discussed plan with Antonio, who verbalizes agreement.   HPI HPI: 82 yo F presented from home to the ED 6/17 with AMS, right gaze, mutism. Dx hypoglycemia, metabolic encephalopathy, possible pna. MRI: punctate foci of acute ischemia within bilateral cerebellum; multiple old cerebellar infarcts; chronic small vessel ischemia. PMHX of multiple myeloma, HLD, HTN,      SLP Plan  Continue with current plan of care      Recommendations for follow up therapy are one component of a multi-disciplinary discharge planning process, led by the attending physician.  Recommendations may be updated based on patient status, additional functional criteria and insurance authorization.    Recommendations  Diet recommendations: NPO                  Oral care QID     Dysphagia, unspecified (R13.10)     Continue with current plan of care    Teliyah Royal L. Samson Frederic, MA CCC/SLP Clinical Specialist - Acute Care SLP Acute Rehabilitation Services Office number [phone]  Blenda Mounts Laurice  07/28/2022, 9:14 AM

## 2022-07-28 NOTE — Evaluation (Signed)
Physical Therapy Evaluation Patient Details Name: Kim Mills MRN: [id number] DOB: [birth year] Today's Date: 07/28/2022  History of Present Illness  Pt is a 82 y/o F admitted on 07/26/22 after presenting with hypoglycemia & confusion. Pt is being treated for acute metabolic encephalopathy. MRI did show "punctate foci of acute ischemia within both cerebellar hemispheres". PMH: multiple myeloma, DM, obesity, HTN, hypothyroidism  Clinical Impression  Pt seen for PT evaluation with co-tx with OT for pt & therapists' safety. Pt's son [facility]) present & reports pt was living with family providing 24 hr support. Pt has had a decline this past week but prior to that family was assisting pt with stand pivot to w/c, BSC. On this date, pt lethargic throughout session, noted to be incontinent of BM. PT & OT provide total assist +2 for rolling L<>R & peri care with very little to no improvement in alertness from pt. Pt with increased tone in BUE>BLE. Recommend ongoing PT services to focus on activity tolerance & alertness.       Recommendations for follow up therapy are one component of a multi-disciplinary discharge planning process, led by the attending physician.  Recommendations may be updated based on patient status, additional functional criteria and insurance authorization.  Follow Up Recommendations Can patient physically be transported by private vehicle: No     Assistance Recommended at Discharge Frequent or constant Supervision/Assistance  Patient can return home with the following  Two people to help with walking and/or transfers;Two people to help with bathing/dressing/bathroom;Direct supervision/assist for medications management;Help with stairs or ramp for entrance;Assistance with feeding;Assist for transportation;Direct supervision/assist for financial management;Assistance with cooking/housework    Equipment Recommendations None recommended by PT (TBD in next venue)  Recommendations  for Other Services       Functional Status Assessment Patient has had a recent decline in their functional status and demonstrates the ability to make significant improvements in function in a reasonable and predictable amount of time.     Precautions / Restrictions Precautions Precautions: Fall Restrictions Weight Bearing Restrictions: No      Mobility  Bed Mobility Overal bed mobility: Needs Assistance Bed Mobility: Rolling Rolling: Total assist, +2 for safety/equipment, +2 for physical assistance         General bed mobility comments: after placing R hand on bedrail, weak grasp noted; no appreciable command following    Transfers                        Ambulation/Gait                  Stairs            Wheelchair Mobility    Modified Rankin (Stroke Patients Only)       Balance                                             Pertinent Vitals/Pain Pain Assessment Pain Assessment: Faces Faces Pain Scale: Hurts a little bit Pain Location: generalized with BUE stretching Pain Descriptors / Indicators: Grimacing, Guarding, Moaning Pain Intervention(s): Repositioned, Monitored during session    Home Living Family/patient expects to be discharged to:: Private residence Living Arrangements: Children Available Help at Discharge: Family;Available 24 hours/day Type of Home: House Home Access: Ramped entrance       Home Layout: Two level;Able to live on main level with  bedroom/bathroom;Other (Comment) (basement downstairs) Home Equipment: Wheelchair - manual;Hospital bed;BSC/3in1;Tub bench      Prior Function Prior Level of Function : Needs assist             Mobility Comments: assist for transfers to/from wheelchair and tub bench ADLs Comments: able to assist with UB ADLs, assist for LB ADLs     Hand Dominance   Dominant Hand: Right    Extremity/Trunk Assessment   Upper Extremity Assessment Upper Extremity  Assessment: Defer to OT evaluation RUE Deficits / Details: noted spasticity in with B elbow extension, shoulder flexion pasively.    Lower Extremity Assessment Lower Extremity Assessment: Difficult to assess due to impaired cognition;RLE deficits/detail;LLE deficits/detail RLE Deficits / Details: BLE spasticity    Cervical / Trunk Assessment Cervical / Trunk Assessment: Kyphotic  Communication   Communication: Expressive difficulties;Receptive difficulties  Cognition Arousal/Alertness: Lethargic Behavior During Therapy: Flat affect Overall Cognitive Status: Difficult to assess                                 General Comments: lethargic; does grimace/moan with stimulation, will briefly open eyes to stimulation though not on command; no appreciable command following, some automatic grip to bedrail when R hand placed. if eye lids manually opened, noted pt eye will gaze to therapist        General Comments General comments (skin integrity, edema, etc.): son [facility]) present for session    Exercises     Assessment/Plan    PT Assessment Patient needs continued PT services  PT Problem List Decreased strength;Decreased coordination;Pain;Decreased range of motion;Decreased cognition;Decreased activity tolerance;Decreased knowledge of use of DME;Decreased balance;Decreased safety awareness;Decreased mobility;Decreased knowledge of precautions;Decreased skin integrity;Impaired sensation       PT Treatment Interventions Therapeutic exercise;DME instruction;Balance training;Gait training;Therapeutic activities;Patient/family education;Functional mobility training;Neuromuscular re-education;Cognitive remediation;Modalities    PT Goals (Current goals can be found in the Care Plan section)  Acute Rehab PT Goals Patient Stated Goal: pt to get better PT Goal Formulation: With family Time For Goal Achievement: 08/11/22 Potential to Achieve Goals: Fair    Frequency Min  3X/week     Co-evaluation   Reason for Co-Treatment: Complexity of the patient's impairments (multi-system involvement);For patient/therapist safety;Necessary to address cognition/behavior during functional activity   OT goals addressed during session: ADL's and self-care;Strengthening/ROM       AM-PAC PT "6 Clicks" Mobility  Outcome Measure Help needed turning from your back to your side while in a flat bed without using bedrails?: Total Help needed moving from lying on your back to sitting on the side of a flat bed without using bedrails?: Total Help needed moving to and from a bed to a chair (including a wheelchair)?: Total Help needed standing up from a chair using your arms (e.g., wheelchair or bedside chair)?: Total Help needed to walk in hospital room?: Total Help needed climbing 3-5 steps with a railing? : Total 6 Click Score: 6    End of Session   Activity Tolerance: Patient limited by lethargy Patient left: in bed;with call bell/phone within reach;with bed alarm set;with family/visitor present Nurse Communication:  (need for new dressing on sacrum) PT Visit Diagnosis: Difficulty in walking, not elsewhere classified (R26.2);Other abnormalities of gait and mobility (R26.89)    Time: 1610-9604 PT Time Calculation (min) (ACUTE ONLY): 26 min   Charges:   PT Evaluation $PT Eval High Complexity: 1 High          [place]  Hyacinth Meeker, PT, DPT 07/28/22, 10:24 AM   Sandi Mariscal 07/28/2022, 10:22 AM

## 2022-07-28 NOTE — Progress Notes (Addendum)
STROKE TEAM PROGRESS NOTE   INTERVAL HISTORY Her son is at the bedside.  Patient is in bed in no apparent distress.  She is very somnolent.  Eyes are closed she does not open eyes to voice or stimulation.  She does not follow commands or have any verbal output.  She does withdraw in all 4 extremities.  Son endorses this is how she has been for about a week MRI scan of the brain shows bilateral tiny punctate cerebellar infarcts with multiple old cerebellar infarcts and changes of chronic small vessel disease.  CT angiogram shows mild changes of intracranial extracranial atherosclerosis without large vessel stenosis or occlusion. Vitals:   07/27/22 2347 07/28/22 0540 07/28/22 0700 07/28/22 1100  BP: (!) 156/61 (!) 166/81 (!) 143/74 (!) 133/42  Pulse: 93 96 96 88  Resp: 19 18 18 20   Temp: 99 F (37.2 C) 97.9 F (36.6 C) 99.5 F (37.5 C) 98.8 F (37.1 C)  TempSrc:  Oral Axillary Axillary  SpO2: 98%  98% 100%  Weight:      Height:       CBC:  Recent Labs  Lab 07/26/22 0740 07/26/22 0748 07/27/22 0417 07/28/22 0208  WBC 5.3  --  5.6 6.5  NEUTROABS 3.3  --   --   --   HGB 9.8*   < > 8.6* 8.4*  HCT 31.3*   < > 26.6* 26.2*  MCV 95.7  --  91.4 90.0  PLT 117*  --  161 152   < > = values in this interval not displayed.   Basic Metabolic Panel:  Recent Labs  Lab 07/27/22 0417 07/28/22 0208  NA 132* 134*  K 4.1 3.9  CL 106 109  CO2 18* 17*  GLUCOSE 255* 220*  BUN 15 7*  CREATININE 1.02* 1.00  CALCIUM 7.2* 7.3*   Lipid Panel:  Recent Labs  Lab 07/26/22 0944  CHOL 107  TRIG 143  HDL 31*  CHOLHDL 3.5  VLDL 29  LDLCALC 47   HgbA1c:  Recent Labs  Lab 07/26/22 0944  HGBA1C 6.4*   Urine Drug Screen:  Recent Labs  Lab 07/26/22 1005  LABOPIA POSITIVE*  COCAINSCRNUR NONE DETECTED  LABBENZ NONE DETECTED  AMPHETMU NONE DETECTED  THCU NONE DETECTED  LABBARB NONE DETECTED    Alcohol Level  Recent Labs  Lab 07/26/22 0756  ETH <10    IMAGING past 24 hours DG Abd  Portable 1V  Result Date: 07/28/2022 CLINICAL DATA:  Feeding tube placement EXAM: PORTABLE ABDOMEN - 1 VIEW COMPARISON:  None Available. FINDINGS: Feeding tube tip is in the distal stomach. Mild left basilar atelectasis. Limited evaluation of the right hemithorax due to patient arm position. Visualized portions of the abdomen are unremarkable. IMPRESSION: Feeding tube tip is in the distal stomach. Electronically Signed   By: Allegra Lai M.D.   On: 07/28/2022 14:26    PHYSICAL EXAM  Temp:  [97.9 F (36.6 C)-99.8 F (37.7 C)] 98.8 F (37.1 C) (06/19 1100) Pulse Rate:  [88-97] 88 (06/19 1100) Resp:  [17-20] 20 (06/19 1100) BP: (133-166)/(42-81) 133/42 (06/19 1100) SpO2:  [98 %-100 %] 100 % (06/19 1100)  General - Well nourished, well developed, in no apparent distress. Cardiovascular - Regular rhythm and rate.  Mental Status -  She is very somnolent.  Eyes are closed she does not open eyes to voice or stimulation.  Eyes are midline, PERRL.  Face appears symmetric.  She does not follow commands or have any verbal output.  She does withdraw in all 4 extremities quite purposefully and briskly without focal weakness  Motor Strength -withdraws in all 4 extremities to noxious stimuli.  Bulk was normal and fasciculations were absent.   Motor Tone - Muscle tone was assessed at the neck and appendages and was normal.  Sensory -response to noxious stimuli  Coordination -unable to assess due to AMS  Gait and Station - deferred.  ASSESSMENT/PLAN [name] is a 82 y.o. female with history of hypertension, hyperlipidemia, hypothyroidism, history of cancer, diabetes, GERD, glaucoma, CAD, neuropathy, CKD presenting with recurrent hypoglycemia events, altered mental status and a right gaze deviation.   Stroke: Incidental punctate ischemia bilateral cerebellar unlikely to explain her altered mental status which is likely multifactorial Etiology: In the setting of hypo-glycemic events,  infection, dehydration, hypotension Code Stroke  CT head No acute abnormality. ASPECTS 10.    CTA head & neck no LVO.  Mild intracranial atherosclerosis.  50% right ICA stenosis MRI punctate acute ischemia in bilateral cerebral hemispheres 2D Echo EF 60 to 65%.  LV grade 1 diastolic dysfunction Carotid ultrasound ending LDL 47 HgbA1c 6.4 VTE prophylaxis -heparin subcu    Diet   Diet NPO time specified   Aspirin 81 mg prior to admission, now on Plavix 75 mg Therapy recommendations: Pending Disposition: Pending  Hypertension Home meds: Losartan 25 mg, metoprolol 12.5 mg twice daily Stable Permissive hypertension (OK if < 220/120) but gradually normalize in 5-7 days Long-term BP goal normotensive  Hyperlipidemia Home meds: Simvastatin 40 mg, resumed in hospital LDL 47, goal < 70 Continue statin at discharge  Diabetes type II UnControlled Hypoglycemic events Home meds: Glipizide 5 mg, metformin 1000 mg twice daily HgbA1c 6.4, goal < 7.0 CBGs Recent Labs    07/28/22 0624 07/28/22 0736 07/28/22 1406  GLUCAP 207* 227* 217*    SSI Will need close PCP follow-up after discharge  UTI  CAP On ceftriaxone and azithromycin Managed by primary  Other Stroke Risk Factors Advanced Age >/= 62  Obesity, Body mass index is 30.05 kg/m., BMI >/= 30 associated with increased stroke risk, recommend weight loss, diet and exercise as appropriate   Other Active Problems Hypothyroidism Hyponatremia Anemia CKD Multiple myeloma  Hospital day # 1  Gevena Mart DNP, ACNPC-AG  [facility]  STROKE MD NOTE :  I have personally obtained history,examined this patient, reviewed notes, independently viewed imaging studies, participated in medical decision making and plan of care.ROS completed by me personally and pertinent positives fully documented  I have made any additions or clarifications directly to the above note. Agree with note above.  Patient presented with altered  mental status with decreased responsiveness in the setting of episodes of recurrent hypoglycemia likely metabolic encephalopathy and MRI scan shows tiny punctate bilateral cerebellar infarcts which are unlikely to explain her mental status which is likely multifactorial with a combination of possible UTI, pneumonia, antibiotics and recurrent hypoglycemia.  Recommend continue ongoing medical therapy as per primary team for treatment for reversible medical abnormalities.  EEG showing has diffuse encephalopathy without any definite evidence to suggest seizures.  Change aspirin to Plavix for stroke prevention due to silent strokes.  Maintain aggressive risk factor modification.  Discussed with son at the bedside and answered questions.  Discussed with Dr. Isidoro Donning.  Greater than 50% time during this 50-minute visit was spent in counseling and coordination of care about altered mental status and metabolic encephalopathy as well as silent small cerebellar strokes and answering questions.  Delia Heady, MD  Medical Director [facility] Pager: [phone] 07/28/2022 4:35 PM   To contact Stroke Continuity provider, please refer to [email or web address]. After hours, contact General Neurology

## 2022-07-28 NOTE — Consult Note (Signed)
Consultation Note Date: 07/28/2022   Patient Name: Kim Mills  DOB: 06/04/1940  MRN: [id number]  Age / Sex: 82 y.o., female  PCP: [name], MD Referring Physician: Cathren Harsh, MD  Reason for Consultation: Establishing goals of care  HPI/Patient Profile: 82 y.o. female  with past medical history of MM on Revlimid, DM, obesity, HTN and hypothyroidism  admitted on 07/26/2022 with hypoglycemia and confusion. UA and CXR consistent with infection, antibiotics started. CT revealed cerebellar infarcts; per neurology, likely incidental finding.  EEG negative. Patient with ongoing poor MS and poor PO intake. PMT consulted to discuss GOC.   Clinical Assessment and Goals of Care: I have reviewed medical records including EPIC notes, labs and imaging, received report from RN, assessed the patient and then spoke with patient's daughter and son  to discuss diagnosis prognosis, GOC, EOL wishes, disposition and options.  I introduced Palliative Medicine as specialized medical care for people living with serious illness. It focuses on providing relief from the symptoms and stress of a serious illness. The goal is to improve quality of life for both the patient and the family.  Son Zoe Lan was exiting room as patient was about to have cortrak placed. We briefly spoke. I introduced role of palliative care. He informed me that patient's daughter Denzil Magnuson is main Management consultant. This si confirmed in advance directive - Iris is named as primary HCPOA with patient's other daughter Edrick Oh second.   Remained at bedside while cortrak was placed - patient did attempt to reach for tube and grimaced during insertion but never opened eyes. Mental status remained quite poor.   Call to patient's daughter Iris. We reviewed the above. She shared wanting to allow some time with tube feeding going prior to decisions. She also shared wanting to arrange a time all 3 siblings  could be together for goals of care discussion. She will get back to me with a time but is tentatively planning on Friday.   Discussed with Iris the importance of continued conversation with family and the medical providers regarding overall plan of care and treatment options, ensuring decisions are within the context of the patient's values and GOCs.    Questions and concerns were addressed. The family was encouraged to call with questions or concerns.    Primary Decision Maker HCPOA - daughter Iris    SUMMARY OF RECOMMENDATIONS   -cortrak just placed, family would like to allow time for outcomes - general introduction to palliative care with son and daughter - planning for full goals of care discussion when 3 siblings can be together, tentatively plan for Friday  Code Status/Advance Care Planning: Full code     Primary Diagnoses: Present on Admission:  Acute metabolic encephalopathy  Type 2 diabetes mellitus with diabetic neuropathy, unspecified (HCC)  Multiple myeloma not having achieved remission (HCC)  Hypertension  Normocytic anemia  Hypothyroidism  CKD stage 3a, GFR 45-59 ml/min (HCC)  Hyperlipidemia   I have reviewed the medical record, interviewed the patient and family, and examined the patient. The following aspects are pertinent.  Past Medical History:  Diagnosis Date   Arthritis    Cancer [facility])    Cataract    Chest pain    Associated with weakness and fatigue   Diabetes mellitus    A1c of 7.4 in 08/2010   GERD (gastroesophageal reflux disease)    Glaucoma    Hyperlipidemia    Lipid profile in 08/2010:190, 121, 48, 118; normal CBC and CMet  Hypertension    Lipid profile in 08/2010:190, 121, 48, 118; normal CBC and CMet   Hypothyroidism    Hypothyroidism    Obesity    Social History   Socioeconomic History   Marital status: Widowed    Spouse name: Not on file   Number of children: 7   Years of education: Not on file   Highest education level: Not  on file  Occupational History    Employer: RETIRED  Tobacco Use   Smoking status: Never    Passive exposure: Yes   Smokeless tobacco: Never  Vaping Use   Vaping Use: Never used  Substance and Sexual Activity   Alcohol use: Never   Drug use: Never   Sexual activity: Not Currently  Other Topics Concern   Not on file  Social History Narrative   Daughter, Denzil Magnuson, lives with and assists patient.    Social Determinants of Health   Financial Resource Strain: Low Risk  (11/05/2020)   Overall Financial Resource Strain (CARDIA)    Difficulty of Paying Living Expenses: Not hard at all  Food Insecurity: No Food Insecurity (07/26/2022)   Hunger Vital Sign    Worried About Running Out of Food in the Last Year: Never true    Ran Out of Food in the Last Year: Never true  Transportation Needs: No Transportation Needs (07/26/2022)   PRAPARE - Administrator, Civil Service (Medical): No    Lack of Transportation (Non-Medical): No  Physical Activity: Sufficiently Active (11/05/2020)   Exercise Vital Sign    Days of Exercise per Week: 5 days    Minutes of Exercise per Session: 30 min  Stress: No Stress Concern Present (11/05/2020)   [employer] of Occupational Health - Occupational Stress Questionnaire    Feeling of Stress : Not at all  Social Connections: Moderately Integrated (11/05/2020)   Social Connection and Isolation Panel [NHANES]    Frequency of Communication with Friends and Family: More than three times a week    Frequency of Social Gatherings with Friends and Family: More than three times a week    Attends Religious Services: 1 to 4 times per year    Active Member of [employer] or Organizations: Yes    Attends Banker Meetings: 1 to 4 times per year    Marital Status: Widowed   Family History  Problem Relation Age of Onset   Anemia Father    Arthritis Mother    Stroke Brother    Cancer Sister        unknown kind   Diabetes Sister    Dementia Sister     Cancer Niece        Breast   Cancer Brother    Healthy Son    Healthy Son    Healthy Son    Healthy Daughter    Healthy Daughter    Healthy Daughter    Sarcoidosis Daughter    Scheduled Meds:   stroke: early stages of recovery book   Does not apply Once   aspirin  81 mg Oral Daily   heparin  5,000 Units Subcutaneous Q8H   insulin aspart  0-6 Units Subcutaneous TID WC   leptospermum manuka honey  1 Application Topical Daily   levothyroxine  137 mcg Oral Q0600   simvastatin  40 mg Oral q1800   sodium chloride flush  3 mL Intravenous Q12H   sodium chloride flush  3 mL Intravenous Q12H   Continuous Infusions:  sodium chloride  azithromycin 500 mg (07/27/22 2140)   cefTRIAXone (ROCEPHIN)  IV 2 g (07/27/22 2042)   dextrose 5 % with KCl 20 mEq / L 20 mEq (07/28/22 0550)   PRN Meds:.sodium chloride, acetaminophen **OR** acetaminophen, albuterol, bisacodyl, hydrALAZINE, morphine injection, ondansetron **OR** ondansetron (ZOFRAN) IV, sodium chloride flush No Known Allergies Review of Systems  Unable to perform ROS: Mental status change    Physical Exam Constitutional:      General: She is not in acute distress.    Appearance: She is ill-appearing.     Comments: Does not open eyes or respond to touch - resisting cortrak placement, does not follow commands  Pulmonary:     Effort: Pulmonary effort is normal.  Skin:    General: Skin is warm and dry.     Vital Signs: BP (!) 143/74 (BP Location: Right Arm)   Pulse 96   Temp 99.5 F (37.5 C) (Axillary)   Resp 18   Ht 5\' 4"  (1.626 m)   Wt 79.4 kg   SpO2 98%   BMI 30.05 kg/m  Pain Scale: Faces   Pain Score: 0-No pain   SpO2: SpO2: 98 % O2 Device:SpO2: 98 % O2 Flow Rate: .   IO: Intake/output summary:  Intake/Output Summary (Last 24 hours) at 07/28/2022 1057 Last data filed at 07/27/2022 1300 Gross per 24 hour  Intake 0 ml  Output 400 ml  Net -400 ml    LBM: Last BM Date :  (PTA) Baseline Weight: Weight: 79.4  kg Most recent weight: Weight: 79.4 kg     Palliative Assessment/Data: PPS 20%    *Please note that this is a verbal dictation therefore any spelling or grammatical errors are due to the "Dragon Medical One" system interpretation.   Gerlean Ren, DNP, AGNP-C Palliative Medicine Team [phone] Pager: [phone]

## 2022-07-28 NOTE — Procedures (Signed)
Cortrak  Tube Type:  Cortrak - 43 inches Tube Location:  Right nare Initial Placement:  Stomach Secured by: Bridle Technique Used to Measure Tube Placement:  Marking at nare/corner of mouth Cortrak Secured At:  60 cm   Cortrak Tube Team Note:  Consult received to place a Cortrak feeding tube.   X-ray is required, abdominal x-ray has been ordered by the Cortrak team. Please confirm tube placement before using the Cortrak tube.   If the tube becomes dislodged please keep the tube and contact the Cortrak team at [email or web address] for replacement.  If after hours and replacement cannot be delayed, place a NG tube and confirm placement with an abdominal x-ray.    Betsey Holiday MS, RD, LDN Please refer to [facility] for RD and/or RD on-call/weekend/after hours pager

## 2022-07-28 NOTE — Progress Notes (Addendum)
[facility]                                                                              Kim Mills, is a 82 y.o. female, DOB - 19-Nov-1940, ZOX:[id number] Admit date - 07/26/2022    Outpatient Primary MD for the patient is [name], MD  LOS - 1  days  Chief Complaint  Patient presents with   Code Stroke       Brief summary   Patient is a 82 year old female with multiple myeloma on Revlimid, DM, obesity, HTN and hypothyroidism who presented with hypoglycemia and confusion.  The week prior, patient had UTI, treated with Macrobid, there was some noted somnolence with taking Macrobid suspected interaction with Revlimid.  Patient completed antibiotics, was back to baseline and then on the day prior to admission had diarrhea and on the morning of admission was unresponsive.  EMS found her with glucose of 30. Patient was admitted for further workup. Assessment & Plan    Principal Problem:   Acute metabolic encephalopathy superimposed on dementia -RPR, TSH, B12 low normal, folate low, obtain B1 -UA positive for UTI, urine culture showing more than 100,000 colonies of gram-negative rods, on antibiotics -Chest x-ray showed ill-defined opacities in the left lung base and mid right lung, reflecting atelectasis or pneumonia -Still very somnolent, continue IV antibiotics -Placed on folic acid 1 mg daily -B12 811, placed on vitamin B12 IM daily, will transition to p.o. once core track placed -MRI brain showed punctate foci of acute ischemia in both cerebellar hemispheres.  No hemorrhage or mass effect.  Multiple old cerebellar infarcts and chronic small vessel ischemia -EEG negative, for now continue IVF -Per son, at baseline she has been declining in the last 1 month, sleeping more and eating less in the last 2 weeks.  Active Problems: Acute CVA both cerebellar hemispheres -MRI brain showed punctate foci of acute ischemia in both cerebellar hemispheres.  No  hemorrhage or mass effect.  Multiple old cerebellar infarcts and chronic small vessel ischemia -Neurology following, recommended aspirin 81 mg daily -PT OT, SLP eval -CTA head and neck showed mild intracranial atherosclerosis without large vessel occlusion or significant proximal stenosis, 50% right ICA origin stenosis -2D echo showed EF of 60 to 65%, G1 DD -Somnolent, dysphagia, will place core track today, RD for tube feeds -Discussed with patient's son, Desma Maxim at the bedside also reported that patient has not been eating for 2 weeks and has been declining in the last 1 month.  She has underlying history of dementia. -Palliative medicine consulted for goals of care     UTI (urinary tract infection) -Follow urine culture and sensitivities, continue IV Rocephin     Hypoglycemia -Patient's son at the bedside reported that she has been eating much less over the last several weeks and sleeping a lot.  Hypoglycemic to 30s when found by EMS. - Continue dextrose infusion      CAP (community acquired pneumonia) - Continue Rocephin and azithromycin    Hyponatremia Mild, continue IV fluids -Improving   Multiple myeloma not having achieved remission (HCC) Follows with Dr. Ellin Saba, on Revlimid -Palliative medicine consult  CKD stage 3a, GFR 45-59 ml/min (HCC) Creatinine stable within baseline range   Normocytic anemia Due to multiple myeloma, hb 11-12 a few months ago.  -Follow H&H  Hyperlipidemia - Hold simvastatin until able to take p.o.   Hypertension Blood pressure low normal - Hold losartan, metoprolol   Hypothyroidism TSH normal - Continue levothyroxine   Type 2 diabetes mellitus with diabetic neuropathy, unspecified (HCC) -Presented with hypoglycemia -Hemoglobin A1c 6.4 -Continue sliding scale insulin sensitive while inpatient     Pressure injury of skin Stage III, sacrum, present on admission - WOC nurse consult   Moderate protein calorie malnutrition,  hypoalbuminemia < 1.9 Nutrition Problem: Moderate Malnutrition Etiology: chronic illness, multiple myeloma, Signs/Symptoms: mild muscle depletion, mild fat depletion Estimated body mass index is 30.05 kg/m as calculated from the following:   Height as of this encounter: 5\' 4"  (1.626 m).   Weight as of this encounter: 79.4 kg.  Code Status: Full code DVT Prophylaxis:  Place and maintain sequential compression device Start: 07/28/22 0846 heparin injection 5,000 Units Start: 07/26/22 1745 SCDs Start: 07/26/22 1654 Place TED hose Start: 07/26/22 1654   Level of Care: Level of care: Telemetry Medical Family Communication: Updated patient's son, Antonio  at the bedside.  Patient's son agreeable with palliative consult.  Per advanced directives his sister Denzil Magnuson is age POA, patient had agreed for tube feeding if conscious on the advance directives.  Will await palliative consult for goals of care.   Disposition Plan:      Remains inpatient appropriate:      Procedures:    Consultants:   Neurology Palliative medicine  Antimicrobials:   Anti-infectives (From admission, onward)    Start     Dose/Rate Route Frequency Ordered Stop   07/27/22 1900  cefTRIAXone (ROCEPHIN) 2 g in sodium chloride 0.9 % 100 mL IVPB        2 g 200 mL/hr over 30 Minutes Intravenous Every 24 hours 07/27/22 1029     07/26/22 1915  fluconazole (DIFLUCAN) tablet 200 mg  Status:  Discontinued        200 mg Oral Daily 07/26/22 1825 07/27/22 1029   07/26/22 1900  cefTRIAXone (ROCEPHIN) 1 g in sodium chloride 0.9 % 100 mL IVPB  Status:  Discontinued        1 g 200 mL/hr over 30 Minutes Intravenous Every 24 hours 07/26/22 1807 07/27/22 1029   07/26/22 1900  azithromycin (ZITHROMAX) 500 mg in sodium chloride 0.9 % 250 mL IVPB        500 mg 250 mL/hr over 60 Minutes Intravenous Every 24 hours 07/26/22 1807     07/26/22 0945  cefTRIAXone (ROCEPHIN) 1 g in sodium chloride 0.9 % 100 mL IVPB        1 g 200 mL/hr over 30  Minutes Intravenous  Once 07/26/22 0942 07/26/22 1023   07/26/22 0945  azithromycin (ZITHROMAX) 500 mg in sodium chloride 0.9 % 250 mL IVPB        500 mg 250 mL/hr over 60 Minutes Intravenous  Once 07/26/22 0942 07/26/22 1934          Medications  aspirin  81 mg Oral Daily   heparin  5,000 Units Subcutaneous Q8H   insulin aspart  0-5 Units Subcutaneous QHS   insulin aspart  0-9 Units Subcutaneous TID WC   leptospermum manuka honey  1 Application Topical Daily   levothyroxine  137 mcg Oral Q0600   simvastatin  40 mg Oral q1800   sodium chloride flush  3 mL Intravenous Q12H   sodium chloride flush  3 mL Intravenous Q12H      Subjective:   Kim Mills was seen and examined today.  Somnolent, difficult to arouse, son at the bedside.  Difficult to obtain ROS from the patient due to her mental status.  Per RN, patient has been somnolent since admission.  Objective:   Vitals:   07/27/22 2000 07/27/22 2347 07/28/22 0540 07/28/22 0700  BP:  (!) 156/61 (!) 166/81 (!) 143/74  Pulse:  93 96 96  Resp: 18 19 18 18   Temp:  99 F (37.2 C) 97.9 F (36.6 C) 99.5 F (37.5 C)  TempSrc:   Oral Axillary  SpO2:  98%  98%  Weight:      Height:        Intake/Output Summary (Last 24 hours) at 07/28/2022 1108 Last data filed at 07/27/2022 1300 Gross per 24 hour  Intake 0 ml  Output 400 ml  Net -400 ml     Wt Readings from Last 3 Encounters:  07/26/22 79.4 kg  04/22/22 79.4 kg  03/01/22 75.3 kg     Exam General: Ill-appearing, somnolent Cardiovascular: S1 S2 auscultated,  RRR Respiratory: Diminished breath sounds at the bases Gastrointestinal: Soft, nontender, nondistended, + bowel sounds Ext: no pedal edema bilaterally Neuro: withdraws to pain stimuli otherwise difficult to assess Psych: somnolent and lethargic    Data Reviewed:  I have personally reviewed following labs    CBC Lab Results  Component Value Date   WBC 6.5 07/28/2022   RBC 2.91 (L) 07/28/2022   HGB  8.4 (L) 07/28/2022   HCT 26.2 (L) 07/28/2022   MCV 90.0 07/28/2022   MCH 28.9 07/28/2022   PLT 152 07/28/2022   MCHC 32.1 07/28/2022   RDW 16.1 (H) 07/28/2022   LYMPHSABS 1.5 07/26/2022   MONOABS 0.4 07/26/2022   EOSABS 0.1 07/26/2022   BASOSABS 0.0 07/26/2022     Last metabolic panel Lab Results  Component Value Date   NA 134 (L) 07/28/2022   K 3.9 07/28/2022   CL 109 07/28/2022   CO2 17 (L) 07/28/2022   BUN 7 (L) 07/28/2022   CREATININE 1.00 07/28/2022   GLUCOSE 220 (H) 07/28/2022   GFRNONAA 57 (L) 07/28/2022   GFRAA >60 11/12/2019   CALCIUM 7.3 (L) 07/28/2022   PROT 5.5 (L) 07/28/2022   ALBUMIN 1.9 (L) 07/28/2022   LABGLOB 2.9 07/12/2022   AGRATIO 0.9 07/12/2022   BILITOT 0.3 07/28/2022   ALKPHOS 66 07/28/2022   AST 19 07/28/2022   ALT 25 07/28/2022   ANIONGAP 8 07/28/2022    CBG (last 3)  Recent Labs    07/27/22 2137 07/28/22 0624 07/28/22 0736  GLUCAP 205* 207* 227*      Coagulation Profile: Recent Labs  Lab 07/26/22 0805  INR 1.2     Radiology Studies: I have personally reviewed the imaging studies  CT ANGIO HEAD NECK W WO CM  Result Date: 07/27/2022 CLINICAL DATA:  Stroke/TIA, determine embolic source. Small acute cerebellar infarcts on MRI. EXAM: CT ANGIOGRAPHY HEAD AND NECK WITH AND WITHOUT CONTRAST TECHNIQUE: Multidetector CT imaging of the head and neck was performed using the standard protocol during bolus administration of intravenous contrast. Multiplanar CT image reconstructions and MIPs were obtained to evaluate the vascular anatomy. Carotid stenosis measurements (when applicable) are obtained utilizing NASCET criteria, using the distal internal carotid diameter as the denominator. RADIATION DOSE REDUCTION: This exam was performed according to the departmental dose-optimization program which includes automated  exposure control, adjustment of the mA and/or kV according to patient size and/or use of iterative reconstruction technique. CONTRAST:   65mL OMNIPAQUE IOHEXOL 350 MG/ML SOLN COMPARISON:  Head CT and MRI 07/26/2022 FINDINGS: CT HEAD FINDINGS Brain: The punctate acute cerebellar infarcts on MRI are acute by CT. Small chronic bilateral cerebellar infarcts are again noted. No acute large territory infarct, intracranial hemorrhage, mass, midline shift, or extra-axial fluid collection is identified. Patchy hypodensities in the cerebral white matter are nonspecific but compatible with moderate chronic small vessel ischemic disease. Mild cerebral atrophy is within normal limits for age. Vascular: Calcified atherosclerosis at the skull base. Skull: No fracture or suspicious osseous lesion. Sinuses/Orbits: Mild left fronto ethmoid mucosal thickening. Clear mastoid air cells. Unremarkable orbits. Other: None. Review of the MIP images confirms the above findings CTA NECK FINDINGS Aortic arch: Standard 3 vessel aortic arch with mild atherosclerotic plaque. No significant stenosis of the arch vessel origins. Right carotid system: Patent with mixed calcified and soft plaque at the carotid bifurcation resulting in 50% stenosis of the ICA origin. Left carotid system: Patent with predominantly calcified plaque scattered throughout the mid to distal common carotid artery and in the carotid bulb. No evidence of a significant stenosis. Vertebral arteries: Patent with the right being dominant. Calcified plaque at the vertebral origins resulting in mild stenosis bilaterally. Skeleton: No suspicious osseous lesion. Other neck: No evidence of cervical lymphadenopathy or mass. History of thyroidectomy. Upper chest: The included lung apices are clear. Review of the MIP images confirms the above findings CTA HEAD FINDINGS Anterior circulation: The internal carotid arteries are patent from skull base to carotid termini with atherosclerotic calcification resulting in up to mild siphon stenosis on the right. The right ICA is congenitally smaller than the left due to hypoplasia  of the right A1 segment. ACAs and MCAs are patent without evidence of a proximal branch occlusion or significant proximal stenosis. No aneurysm is identified. Posterior circulation: The intracranial vertebral arteries are patent to the basilar with the right being moderately dominant. Patent PICA and SCA origins are visualized bilaterally. The basilar artery is widely patent. Posterior communicating arteries are diminutive or absent. PCAs are patent without evidence of a significant proximal stenosis. No aneurysm is identified. Venous sinuses: Hypoplastic left transverse and sigmoid sinuses which are incompletely opacified, potentially due to slower flow and contrast timing. Other dural venous sinuses are patent. Anatomic variants: Hypoplastic right A1 segment. Review of the MIP images confirms the above findings IMPRESSION: 1. Mild intracranial atherosclerosis without a large vessel occlusion or significant proximal stenosis. 2. 50% right ICA origin stenosis. 3. Mild bilateral vertebral artery origin stenoses. 4.  Aortic Atherosclerosis (ICD10-I70.0). Electronically Signed   By: Sebastian Ache M.D.   On: 07/27/2022 16:17   ECHOCARDIOGRAM COMPLETE  Result Date: 07/27/2022    ECHOCARDIOGRAM REPORT   Patient Name:   Kim Mills Date of Exam: 07/27/2022 Medical Rec #:  [id number]        Height:       64.0 in Accession #:    [id number]       Weight:       175.0 lb Date of Birth:  1941/01/26       BSA:          1.849 m Patient Age:    3 years         BP:           155/32 mmHg Patient Gender: F  HR:           89 bpm. Exam Location:  Inpatient Procedure: 2D Echo, Cardiac Doppler, Color Doppler and Intracardiac            Opacification Agent Indications:   Stroke  History:       Patient has no prior history of Echocardiogram examinations.                Stroke.  Sonographer:   Darlys Gales Referring      [id number] CORTNEY E DE LA TORRE Phys: IMPRESSIONS  1. Left ventricular ejection fraction, by  estimation, is 60 to 65%. The left ventricle has normal function. The left ventricle has no regional wall motion abnormalities. Left ventricular diastolic parameters are consistent with Grade I diastolic dysfunction (impaired relaxation).  2. Right ventricular systolic function is normal. The right ventricular size is normal. Tricuspid regurgitation signal is inadequate for assessing PA pressure.  3. The mitral valve is normal in structure. No evidence of mitral valve regurgitation. No evidence of mitral stenosis.  4. The aortic valve is tricuspid. Aortic valve regurgitation is not visualized. No aortic stenosis is present.  5. The inferior vena cava is normal in size with greater than 50% respiratory variability, suggesting right atrial pressure of 3 mmHg. FINDINGS  Left Ventricle: Left ventricular ejection fraction, by estimation, is 60 to 65%. The left ventricle has normal function. The left ventricle has no regional wall motion abnormalities. Definity contrast agent was given IV to delineate the left ventricular  endocardial borders. The left ventricular internal cavity size was normal in size. There is no left ventricular hypertrophy. Left ventricular diastolic parameters are consistent with Grade I diastolic dysfunction (impaired relaxation). Normal left ventricular filling pressure. Right Ventricle: The right ventricular size is normal. No increase in right ventricular wall thickness. Right ventricular systolic function is normal. Tricuspid regurgitation signal is inadequate for assessing PA pressure. Left Atrium: Left atrial size was normal in size. Right Atrium: Right atrial size was normal in size. Pericardium: There is no evidence of pericardial effusion. Mitral Valve: The mitral valve is normal in structure. No evidence of mitral valve regurgitation. No evidence of mitral valve stenosis. Tricuspid Valve: The tricuspid valve is normal in structure. Tricuspid valve regurgitation is not demonstrated. No  evidence of tricuspid stenosis. Aortic Valve: The aortic valve is tricuspid. Aortic valve regurgitation is not visualized. No aortic stenosis is present. Aortic valve peak gradient measures 6.7 mmHg. Pulmonic Valve: The pulmonic valve was not well visualized. Pulmonic valve regurgitation is not visualized. No evidence of pulmonic stenosis. Aorta: The aortic root is normal in size and structure. Venous: The inferior vena cava is normal in size with greater than 50% respiratory variability, suggesting right atrial pressure of 3 mmHg. IAS/Shunts: No atrial level shunt detected by color flow Doppler.  LEFT VENTRICLE PLAX 2D LVIDd:         3.70 cm   Diastology LVIDs:         2.10 cm   LV e' lateral:   5.44 cm/s LV PW:         1.10 cm   LV E/e' lateral: 8.3 LV IVS:        1.10 cm LVOT diam:     2.00 cm LVOT Area:     3.14 cm  LEFT ATRIUM           Index        RIGHT ATRIUM          Index LA  Vol (A4C): 27.2 ml 14.71 ml/m  RA Area:     9.76 cm                                    RA Volume:   21.80 ml 11.79 ml/m  AORTIC VALVE AV Area (Vmax): 2.15 cm AV Vmax:        129.00 cm/s AV Peak Grad:   6.7 mmHg LVOT Vmax:      88.30 cm/s  AORTA Ao Root diam: 3.10 cm MITRAL VALVE MV Area (PHT): 2.90 cm    SHUNTS MV Decel Time: 262 msec    Systemic Diam: 2.00 cm MV E velocity: 45.00 cm/s MV A velocity: 74.60 cm/s MV E/A ratio:  0.60 Mihai Croitoru MD Electronically signed by Thurmon Fair MD Signature Date/Time: 07/27/2022/3:00:57 PM    Final    VAS US CAROTID (at [facility] and [facility] only)  Result Date: 07/27/2022 Carotid Arterial Duplex Study Patient Name:  Kim Mills  Date of Exam:   07/27/2022 Medical Rec #: [id number]         Accession #:    [id number] Date of Birth: 17-Sep-1940        Patient Gender: F Patient Age:   18 years Exam Location:  [facility] Procedure:      VAS US CAROTID Referring Phys: Dewitt Hoes DE LA TORRE --------------------------------------------------------------------------------  Indications:        CVA. Risk Factors:      Hypertension, hyperlipidemia, Diabetes, coronary artery                    disease. Limitations        Today's exam was limited due to patient movement/vocalization                    during exam. Comparison Study:  CTA Neck also ordered and performed today - results pending. Performing Technologist: Jean Rosenthal RDMS, RVT  Examination Guidelines: A complete evaluation includes B-mode imaging, spectral Doppler, color Doppler, and power Doppler as needed of all accessible portions of each vessel. Bilateral testing is considered an integral part of a complete examination. Limited examinations for reoccurring indications may be performed as noted.  Right Carotid Findings: +----------+--------+--------+--------+------------------+--------+           PSV cm/sEDV cm/sStenosisPlaque DescriptionComments +----------+--------+--------+--------+------------------+--------+ CCA Prox  117     16                                         +----------+--------+--------+--------+------------------+--------+ CCA Distal109     15      <50%    calcific                   +----------+--------+--------+--------+------------------+--------+ ICA Prox  66      15      1-39%   heterogenous               +----------+--------+--------+--------+------------------+--------+ ICA Distal69      15                                         +----------+--------+--------+--------+------------------+--------+ ECA       241     17      >50%  heterogenous               +----------+--------+--------+--------+------------------+--------+ +----------+--------+-------+----------------+-------------------+           PSV cm/sEDV cmsDescribe        Arm Pressure (mmHG) +----------+--------+-------+----------------+-------------------+ [id number]            Multiphasic, WNL                    +----------+--------+-------+----------------+-------------------+  +---------+--------+--+--------+--+---------+ VertebralPSV cm/s68EDV cm/s14Antegrade +---------+--------+--+--------+--+---------+  Left Carotid Findings: +----------+--------+--------+--------+------------------+--------+           PSV cm/sEDV cm/sStenosisPlaque DescriptionComments +----------+--------+--------+--------+------------------+--------+ CCA Prox  72      12      <50%    calcific                   +----------+--------+--------+--------+------------------+--------+ CCA Distal90      21                                         +----------+--------+--------+--------+------------------+--------+ ICA Prox  85      12      1-39%   heterogenous               +----------+--------+--------+--------+------------------+--------+ ICA Distal64      17                                         +----------+--------+--------+--------+------------------+--------+ ECA       353             >50%    heterogenous               +----------+--------+--------+--------+------------------+--------+ +----------+--------+--------+----------------+-------------------+           PSV cm/sEDV cm/sDescribe        Arm Pressure (mmHG) +----------+--------+--------+----------------+-------------------+ [id number]             Multiphasic, WNL                    +----------+--------+--------+----------------+-------------------+ +---------+--------+--+--------+--+---------+ VertebralPSV cm/s72EDV cm/s15Antegrade +---------+--------+--+--------+--+---------+   Summary: Right Carotid: Velocities in the right ICA are consistent with a 1-39% stenosis.                Non-hemodynamically significant plaque <50% noted in the CCA. The                ECA appears >50% stenosed. Left Carotid: Velocities in the left ICA are consistent with a 1-39% stenosis.               Non-hemodynamically significant plaque <50% noted in the CCA. The               ECA appears >50% stenosed.  Vertebrals:  Bilateral vertebral arteries demonstrate antegrade flow. Subclavians: Normal flow hemodynamics were seen in bilateral subclavian              arteries. *See table(s) above for measurements and observations.     Preliminary    MR Brain W and Wo Contrast  Result Date: 07/26/2022 CLINICAL DATA:  Acute neurologic deficit EXAM: MRI HEAD WITHOUT AND WITH CONTRAST TECHNIQUE: Multiplanar, multiecho pulse sequences of the brain and surrounding structures were obtained without and with intravenous contrast. CONTRAST:  7mL GADAVIST GADOBUTROL 1 MMOL/ML IV SOLN COMPARISON:  None Available. FINDINGS: Brain: There are punctate foci of abnormal diffusion  restriction within both cerebellar hemispheres. There are multiple old cerebellar infarcts. No acute or chronic hemorrhage. There is multifocal hyperintense T2-weighted signal within the white matter. Parenchymal volume and CSF spaces are normal. A partially empty sella is incidentally noted. Vascular: Major flow voids are preserved. Skull and upper cervical spine: Normal calvarium and skull base. Visualized upper cervical spine and soft tissues are normal. Sinuses/Orbits:No paranasal sinus fluid levels or advanced mucosal thickening. No mastoid or middle ear effusion. Normal orbits. IMPRESSION: 1. Punctate foci of acute ischemia within both cerebellar hemispheres. No hemorrhage or mass effect. 2. Multiple old cerebellar infarcts and findings of chronic small vessel ischemia. Electronically Signed   By: Deatra Robinson M.D.   On: 07/26/2022 20:30   EEG adult  Result Date: 07/26/2022 Charlsie Quest, MD     07/26/2022  4:05 PM Patient Name: Kim Mills MRN: [id number] Epilepsy Attending: Charlsie Quest Referring Physician/Provider: Shon Hale, MD Date: 07/26/2022 Duration: 23.39 mins Patient history: 82 yo F w a PMHX of MM, UTI, HLD, HTN, who presents to the ED with the subacute onset of becoming total-care, not speaking, UTI. Baseline wheelchair  bound. Had the acute onset of partial R gaze. EEG to evaluate for seizure Level of alertness: Awake AEDs during EEG study: None Technical aspects: This EEG study was done with scalp electrodes positioned according to the 10-20 International system of electrode placement. Electrical activity was reviewed with band pass filter of 1-70Hz , sensitivity of 7 uV/mm, display speed of 74mm/sec with a 60Hz  notched filter applied as appropriate. EEG data were recorded continuously and digitally stored.  Video monitoring was available and reviewed as appropriate. Description: No clear posterior dominant rhythm was seen. EEG showed continuous generalized predominantly 5-8hz  theta-alpha activity admixed with intermittent 2-3hz  delta slowing. Hyperventilation and photic stimulation were not performed.  Parts of study were difficult to interpret due to significant myogenic artifact.  ABNORMALITY - Continuous slow, generalized IMPRESSION: This technically difficult study is suggestive of moderate diffuse encephalopathy, nonspecific etiology. No seizures or epileptiform discharges were seen throughout the recording. Priyanka Renee Pain M.D. [facility] 07/28/2022, 11:08 AM  Available via Epic secure chat 7am-7pm After 7 pm, please refer to night coverage provider listed on amion.

## 2022-07-28 NOTE — Evaluation (Addendum)
Occupational Therapy Evaluation Patient Details Name: Kim Mills MRN: [id number] DOB: Jun 16, 1940 Today's Date: 07/28/2022   History of Present Illness Pt is a 82 y/o female admitted with UTI, acute metabolic encephalopathy, hypoglycemic event with possible associated brain injury. MRI brain showed punctate foci of acute ischemia within both cerebellar hemispheres. EEG negative. PMH: DM2, thrombocytopenia, multiple myeloma, CKD, HLD, hypothyroidism   Clinical Impression   PTA, pt lives with family who rotate to provide 24/7 support. Pt typically requires assist to pivot to/from wheelchair and assist for LB ADLs at baseline. Pt presents now with significant deficits and unable to follow commands. Pt will move feet when B soles stimulated, brief eye opening and noted spasticity with B elbow PROM. Pt requires Total A x 2 for bed mobility and all ADLs bed level at this time.  Patient will benefit from continued inpatient follow up therapy, <3 hours/day at DC. Noted plan for GOC discussions pending medical improvements.      Recommendations for follow up therapy are one component of a multi-disciplinary discharge planning process, led by the attending physician.  Recommendations may be updated based on patient status, additional functional criteria and insurance authorization.   Assistance Recommended at Discharge Frequent or constant Supervision/Assistance  Patient can return home with the following Two people to help with walking and/or transfers;Two people to help with bathing/dressing/bathroom    Functional Status Assessment  Patient has had a recent decline in their functional status and demonstrates the ability to make significant improvements in function in a reasonable and predictable amount of time.  Equipment Recommendations  Other (comment) (hoyer lift)    Recommendations for Other Services       Precautions / Restrictions Precautions Precautions: Fall Restrictions Weight  Bearing Restrictions: No      Mobility Bed Mobility Overal bed mobility: Needs Assistance Bed Mobility: Rolling Rolling: Total assist, +2 for safety/equipment, +2 for physical assistance         General bed mobility comments: after placing hand on bedrail, weak grasp noted; no appreciable command following    Transfers                   General transfer comment: deferred; will need lift      Balance                                           ADL either performed or assessed with clinical judgement   ADL Overall ADL's : Needs assistance/impaired Eating/Feeding: NPO   Grooming: Total assistance;Bed level                       Toileting- Clothing Manipulation and Hygiene: Total assistance;+2 for physical assistance;With caregiver independent assisting;Bed level Toileting - Clothing Manipulation Details (indicate cue type and reason): profuse bowel incontinence on entry; Total A x 2 for clean up bed level       General ADL Comments: Total A for all ADLs bed level at this time     Vision Ability to See in Adequate Light: 2 Moderately impaired Patient Visual Report: Other (comment) (to be further assessed if pt awakens more) Vision Assessment?: Vision impaired- to be further tested in functional context Additional Comments: difficult to assess due to tendency to keep eyes closed and unable to follow commands consistently     Perception     Praxis  Pertinent Vitals/Pain Pain Assessment Pain Assessment: Faces Faces Pain Scale: Hurts a little bit Pain Location: generalized with BUE stretching Pain Descriptors / Indicators: Grimacing, Guarding, Moaning Pain Intervention(s): Monitored during session     Hand Dominance Right   Extremity/Trunk Assessment Upper Extremity Assessment Upper Extremity Assessment: RUE deficits/detail;LUE deficits/detail;Difficult to assess due to impaired cognition RUE Deficits / Details: noted  spasticity in with B elbow extension, shoulder flexion pasively.   Lower Extremity Assessment Lower Extremity Assessment: Defer to PT evaluation   Cervical / Trunk Assessment Cervical / Trunk Assessment: Kyphotic   Communication Communication Communication: Expressive difficulties;Receptive difficulties   Cognition Arousal/Alertness: Lethargic Behavior During Therapy: Flat affect Overall Cognitive Status: Difficult to assess                                 General Comments: lethargic; does grimace/moan with stimulation, will briefly open eyes to stimulation though not on command; no appreciable command following, some automatic grip to bedrail when hand placed. if eye lids manually opened, noted pt eye will gaze to therapist     General Comments  Son at bedside    Exercises     Shoulder Instructions      Home Living Family/patient expects to be discharged to:: Private residence Living Arrangements: Children Available Help at Discharge: Family;Available PRN/intermittently (family alternates to provide 24/7 care) Type of Home: House Home Access: Ramped entrance     Home Layout: Two level;Able to live on main level with bedroom/bathroom;Other (Comment) (basement downstairs)     Bathroom Shower/Tub: Chief Strategy Officer: Standard     Home Equipment: Wheelchair - manual;Hospital bed;BSC/3in1;Tub bench          Prior Functioning/Environment Prior Level of Function : Needs assist             Mobility Comments: assist for transfers to/from wheelchair and tub bench ADLs Comments: able to assist with UB ADLs, assist for LB ADLs        OT Problem List: Decreased activity tolerance;Impaired balance (sitting and/or standing);Impaired vision/perception;Decreased cognition;Decreased coordination;Impaired tone      OT Treatment/Interventions: Self-care/ADL training;Therapeutic exercise;DME and/or AE instruction;Energy conservation;Therapeutic  activities;Patient/family education    OT Goals(Current goals can be found in the care plan section) Acute Rehab OT Goals Patient Stated Goal: hopeful for pt to improve alertness OT Goal Formulation: With family Time For Goal Achievement: 08/11/22 Potential to Achieve Goals: Fair ADL Goals Pt Will Perform Grooming: with mod assist;bed level Additional ADL Goal #1: Pt to follow one step commands 25% of the time during ADLs/functional tasks Additional ADL Goal #2: Pt to roll side to side with Mod A to assist with ADLs  OT Frequency: Min 2X/week    Co-evaluation PT/OT/SLP Co-Evaluation/Treatment: Yes Reason for Co-Treatment: Complexity of the patient's impairments (multi-system involvement);Necessary to address cognition/behavior during functional activity;For patient/therapist safety   OT goals addressed during session: ADL's and self-care;Strengthening/ROM      AM-PAC OT "6 Clicks" Daily Activity     Outcome Measure Help from another person eating meals?: Total Help from another person taking care of personal grooming?: Total Help from another person toileting, which includes using toliet, bedpan, or urinal?: Total Help from another person bathing (including washing, rinsing, drying)?: Total Help from another person to put on and taking off regular upper body clothing?: Total Help from another person to put on and taking off regular lower body clothing?: Total 6 Click Score: 6  End of Session Nurse Communication: Mobility status;Other (comment) (needs new bandage on bottom)  Activity Tolerance: Patient limited by lethargy Patient left: in bed;with call bell/phone within reach;with bed alarm set;with family/visitor present  OT Visit Diagnosis: Muscle weakness (generalized) (M62.81);Other symptoms and signs involving cognitive function                Time: 7425-9563 OT Time Calculation (min): 26 min Charges:  OT General Charges $OT Visit: 1 Visit OT Evaluation $OT Eval  Moderate Complexity: 1 Mod  Bradd Canary, OTR/L Acute Rehab Services Office: [phone]   Lorre Munroe 07/28/2022, 10:06 AM

## 2022-07-29 DIAGNOSIS — G9341 Metabolic encephalopathy: Secondary | ICD-10-CM | POA: Diagnosis not present

## 2022-07-29 DIAGNOSIS — Z515 Encounter for palliative care: Secondary | ICD-10-CM | POA: Diagnosis not present

## 2022-07-29 DIAGNOSIS — Z7189 Other specified counseling: Secondary | ICD-10-CM | POA: Diagnosis not present

## 2022-07-29 DIAGNOSIS — R569 Unspecified convulsions: Secondary | ICD-10-CM | POA: Diagnosis not present

## 2022-07-29 DIAGNOSIS — E162 Hypoglycemia, unspecified: Secondary | ICD-10-CM | POA: Diagnosis not present

## 2022-07-29 DIAGNOSIS — J189 Pneumonia, unspecified organism: Secondary | ICD-10-CM | POA: Diagnosis not present

## 2022-07-29 LAB — CBC
HCT: 29.9 % — ABNORMAL LOW (ref 36.0–46.0)
Hemoglobin: 9.5 g/dL — ABNORMAL LOW (ref 12.0–15.0)
MCH: 29.6 pg (ref 26.0–34.0)
MCHC: 31.8 g/dL (ref 30.0–36.0)
MCV: 93.1 fL (ref 80.0–100.0)
Platelets: 222 10*3/uL (ref 150–400)
RBC: 3.21 MIL/uL — ABNORMAL LOW (ref 3.87–5.11)
RDW: 15.9 % — ABNORMAL HIGH (ref 11.5–15.5)
WBC: 7.3 10*3/uL (ref 4.0–10.5)
nRBC: 0 % (ref 0.0–0.2)

## 2022-07-29 LAB — BASIC METABOLIC PANEL
Anion gap: 8 (ref 5–15)
BUN: 10 mg/dL (ref 8–23)
CO2: 16 mmol/L — ABNORMAL LOW (ref 22–32)
Calcium: 7.7 mg/dL — ABNORMAL LOW (ref 8.9–10.3)
Chloride: 109 mmol/L (ref 98–111)
Creatinine, Ser: 1.07 mg/dL — ABNORMAL HIGH (ref 0.44–1.00)
GFR, Estimated: 53 mL/min — ABNORMAL LOW (ref >60)
Glucose, Bld: 217 mg/dL — ABNORMAL HIGH (ref 70–99)
Potassium: 4.6 mmol/L (ref 3.5–5.1)
Sodium: 133 mmol/L — ABNORMAL LOW (ref 135–145)

## 2022-07-29 LAB — GLUCOSE, CAPILLARY
Glucose-Capillary: 212 mg/dL — ABNORMAL HIGH (ref 70–99)
Glucose-Capillary: 277 mg/dL — ABNORMAL HIGH (ref 70–99)
Glucose-Capillary: 207 mg/dL — ABNORMAL HIGH (ref 70–99)
Glucose-Capillary: 258 mg/dL — ABNORMAL HIGH (ref 70–99)
Glucose-Capillary: 236 mg/dL — ABNORMAL HIGH (ref 70–99)

## 2022-07-29 LAB — MAGNESIUM: Magnesium: 1.3 mg/dL — ABNORMAL LOW (ref 1.7–2.4)

## 2022-07-29 LAB — PHOSPHORUS: Phosphorus: 1.7 mg/dL — ABNORMAL LOW (ref 2.5–4.6)

## 2022-07-29 MED ORDER — SODIUM PHOSPHATES 45 MMOLE/15ML IV SOLN
15.0000 mmol | Freq: Once | INTRAVENOUS | Status: AC
  Administered 2022-07-29: 15 mmol via INTRAVENOUS
  Filled 2022-07-29: qty 5

## 2022-07-29 MED ORDER — MAGNESIUM SULFATE 2 GM/50ML IV SOLN
2.0000 g | Freq: Once | INTRAVENOUS | Status: AC
  Administered 2022-07-29: 2 g via INTRAVENOUS
  Filled 2022-07-29: qty 50

## 2022-07-29 NOTE — Progress Notes (Signed)
[facility]                                                                              Kim Mills, is a 82 y.o. female, DOB - 25-Sep-1940, WUJ:[id number] Admit date - 07/26/2022    Outpatient Primary MD for the patient is [name], MD  LOS - 2  days  Chief Complaint  Patient presents with   Code Stroke       Brief summary   Patient is a 82 year old female with multiple myeloma on Revlimid, DM, obesity, HTN and hypothyroidism who presented with hypoglycemia and confusion.  The week prior, patient had UTI, treated with Macrobid, there was some noted somnolence with taking Macrobid suspected interaction with Revlimid.  Patient completed antibiotics, was back to baseline and then on the day prior to admission had diarrhea and on the morning of admission was unresponsive.  EMS found her with glucose of 30. Patient was admitted for further workup. Assessment & Plan    Principal Problem:   Acute metabolic encephalopathy superimposed on dementia -RPR, TSH, B12 low normal, folate low, obtain B1 -UA positive for UTI, urine culture showing more than 100,000 colonies of gram-negative rods, on antibiotics -Chest x-ray showed ill-defined opacities in the left lung base and mid right lung, reflecting atelectasis or pneumonia -Continue folic acid, B12 368, placed on vitamin B12 IM daily, will transition to p.o. once core track placed -MRI brain showed punctate foci of acute ischemia in both cerebellar hemispheres.  No hemorrhage or mass effect.  Multiple old cerebellar infarcts and chronic small vessel ischemia -EEG negative -Per son, at baseline she has been declining in the last 1 month, sleeping more and eating less in the last 2 weeks. -Palliative medicine consulted for GOC  Active Problems: Acute CVA both cerebellar hemispheres -MRI brain showed punctate foci of acute ischemia in both cerebellar hemispheres.  No hemorrhage or mass effect.  Multiple old cerebellar  infarcts and chronic small vessel ischemia -Neurology following, recommended aspirin 81 mg daily -CTA head and neck showed mild intracranial atherosclerosis without large vessel occlusion or significant proximal stenosis, 50% right ICA origin stenosis -2D echo showed EF of 60 to 65%, G1 DD -LDL 47, hemoglobin A1c 6.4 -Somnolent, dysphagia, placed core track, started on tube feeds -Palliative medicine consulted for GOC    E. coli UTI (urinary tract infection) -Urine culture showed more than 100,000 colonies of E. coli, continue IV Rocephin      Hypoglycemia, dysphagia -Patient's son at the bedside reported that she has been eating much less over the last several weeks and sleeping a lot.  Hypoglycemic to 30s when found by EMS. - Now started on tube feeds, free water 100 cc every 4 hours -DC dextrose drip, continue sliding scale insulin       CAP (community acquired pneumonia) - Continue Rocephin and azithromycin    Hyponatremia -Sodium 133, DC dextrose drip, continue free water   Multiple myeloma not having achieved remission (HCC) Follows with Dr. Ellin Saba, on Revlimid -Palliative medicine consult   CKD stage 3a, GFR 45-59 ml/min (HCC) Creatinine stable within baseline range   Normocytic anemia Due  to multiple myeloma, hb 11-12 a few months ago. H&H stable  Hyperlipidemia - Hold simvastatin until able to take p.o.   Hypertension BP currently stable - Hold losartan, metoprolol   Hypothyroidism TSH normal - Continue levothyroxine   Type 2 diabetes mellitus with diabetic neuropathy, unspecified (HCC) -Presented with hypoglycemia -Hemoglobin A1c 6.4 -Continue sliding scale insulin sensitive while inpatient     Pressure injury of skin Stage III, sacrum, present on admission - WOC nurse consult   Moderate protein calorie malnutrition, hypoalbuminemia < 1.9 Nutrition Problem: Moderate Malnutrition Etiology: chronic illness, multiple myeloma, Signs/Symptoms:  mild muscle depletion, mild fat depletion Estimated body mass index is 30.05 kg/m as calculated from the following:   Height as of this encounter: 5\' 4"  (1.626 m).   Weight as of this encounter: 79.4 kg.  Code Status: Full code DVT Prophylaxis:  Place and maintain sequential compression device Start: 07/28/22 0846 heparin injection 5,000 Units Start: 07/26/22 1745 SCDs Start: 07/26/22 1654 Place TED hose Start: 07/26/22 1654   Level of Care: Level of care: Telemetry Medical Family Communication: Updated patient's son, Zollie Beckers at the bedside today.    Disposition Plan:      Remains inpatient appropriate:      Procedures:    Consultants:   Neurology Palliative medicine  Antimicrobials:   Anti-infectives (From admission, onward)    Start     Dose/Rate Route Frequency Ordered Stop   07/27/22 1900  cefTRIAXone (ROCEPHIN) 2 g in sodium chloride 0.9 % 100 mL IVPB        2 g 200 mL/hr over 30 Minutes Intravenous Every 24 hours 07/27/22 1029     07/26/22 1915  fluconazole (DIFLUCAN) tablet 200 mg  Status:  Discontinued        200 mg Oral Daily 07/26/22 1825 07/27/22 1029   07/26/22 1900  cefTRIAXone (ROCEPHIN) 1 g in sodium chloride 0.9 % 100 mL IVPB  Status:  Discontinued        1 g 200 mL/hr over 30 Minutes Intravenous Every 24 hours 07/26/22 1807 07/27/22 1029   07/26/22 1900  azithromycin (ZITHROMAX) 500 mg in sodium chloride 0.9 % 250 mL IVPB        500 mg 250 mL/hr over 60 Minutes Intravenous Every 24 hours 07/26/22 1807     07/26/22 0945  cefTRIAXone (ROCEPHIN) 1 g in sodium chloride 0.9 % 100 mL IVPB        1 g 200 mL/hr over 30 Minutes Intravenous  Once 07/26/22 0942 07/26/22 1023   07/26/22 0945  azithromycin (ZITHROMAX) 500 mg in sodium chloride 0.9 % 250 mL IVPB        500 mg 250 mL/hr over 60 Minutes Intravenous  Once 07/26/22 0942 07/26/22 1934          Medications  clopidogrel  75 mg Oral Daily   cyanocobalamin  1,000 mcg Intramuscular Q1200   feeding  supplement (PROSource TF20)  60 mL Per Tube Daily   folic acid  1 mg Intravenous Daily   free water  100 mL Per Tube Q4H   heparin  5,000 Units Subcutaneous Q8H   insulin aspart  0-5 Units Subcutaneous QHS   insulin aspart  0-9 Units Subcutaneous TID WC   leptospermum manuka honey  1 Application Topical Daily   levothyroxine  137 mcg Oral Q0600   simvastatin  40 mg Oral q1800   sodium chloride flush  3 mL Intravenous Q12H   sodium chloride flush  3 mL Intravenous Q12H  Subjective:   Kim Mills was seen and examined today.  Still very somnolent, difficult to arouse, moaning, not following any commands tube feeds started.  Difficult to obtain ROS from the patient, son Zollie Beckers at the bedside  Objective:   Vitals:   07/29/22 0451 07/29/22 0731 07/29/22 0800 07/29/22 1113  BP: (!) 149/77 (!) 155/85  (!) 146/69  Pulse: 88 97  88  Resp: 18 20 20  (!) 22  Temp: 99 F (37.2 C) (!) 100.4 F (38 C)  99.7 F (37.6 C)  TempSrc: Oral Axillary  Oral  SpO2: 100% 99%  100%  Weight:      Height:        Intake/Output Summary (Last 24 hours) at 07/29/2022 1451 Last data filed at 07/29/2022 1100 Gross per 24 hour  Intake 4227.17 ml  Output 1550 ml  Net 2677.17 ml     Wt Readings from Last 3 Encounters:  07/26/22 79.4 kg  04/22/22 79.4 kg  03/01/22 75.3 kg   Physical Exam General: Somnolent, core track + Cardiovascular: S1 S2 clear, RRR.  Respiratory: CTAB, no wheezing Gastrointestinal: Soft, nontender, nondistended, NBS Ext: no pedal edema bilaterally Neuro: not following any commands, withdraws to pain stimuli Psych: somnolent and lethargic     Data Reviewed:  I have personally reviewed following labs    CBC Lab Results  Component Value Date   WBC 7.3 07/29/2022   RBC 3.21 (L) 07/29/2022   HGB 9.5 (L) 07/29/2022   HCT 29.9 (L) 07/29/2022   MCV 93.1 07/29/2022   MCH 29.6 07/29/2022   PLT 222 07/29/2022   MCHC 31.8 07/29/2022   RDW 15.9 (H) 07/29/2022    LYMPHSABS 1.5 07/26/2022   MONOABS 0.4 07/26/2022   EOSABS 0.1 07/26/2022   BASOSABS 0.0 07/26/2022     Last metabolic panel Lab Results  Component Value Date   NA 133 (L) 07/29/2022   K 4.6 07/29/2022   CL 109 07/29/2022   CO2 16 (L) 07/29/2022   BUN 10 07/29/2022   CREATININE 1.07 (H) 07/29/2022   GLUCOSE 217 (H) 07/29/2022   GFRNONAA 53 (L) 07/29/2022   GFRAA >60 11/12/2019   CALCIUM 7.7 (L) 07/29/2022   PHOS 1.7 (L) 07/29/2022   PROT 5.5 (L) 07/28/2022   ALBUMIN 1.9 (L) 07/28/2022   LABGLOB 2.9 07/12/2022   AGRATIO 0.9 07/12/2022   BILITOT 0.3 07/28/2022   ALKPHOS 66 07/28/2022   AST 19 07/28/2022   ALT 25 07/28/2022   ANIONGAP 8 07/29/2022    CBG (last 3)  Recent Labs    07/29/22 0405 07/29/22 0738 07/29/22 1145  GLUCAP 212* 277* 207*      Coagulation Profile: Recent Labs  Lab 07/26/22 0805  INR 1.2     Radiology Studies: I have personally reviewed the imaging studies  DG Abd Portable 1V  Result Date: 07/28/2022 CLINICAL DATA:  Feeding tube placement EXAM: PORTABLE ABDOMEN - 1 VIEW COMPARISON:  None Available. FINDINGS: Feeding tube tip is in the distal stomach. Mild left basilar atelectasis. Limited evaluation of the right hemithorax due to patient arm position. Visualized portions of the abdomen are unremarkable. IMPRESSION: Feeding tube tip is in the distal stomach. Electronically Signed   By: Allegra Lai M.D.   On: 07/28/2022 14:26       Kim Mills M.D. [facility] 07/29/2022, 2:51 PM  Available via Epic secure chat 7am-7pm After 7 pm, please refer to night coverage provider listed on amion.

## 2022-07-29 NOTE — Significant Event (Signed)
Assisting patient out of bed to chair using a hoyer lift. Patient tolerated out of bed sitting in the chair. Returned to bed at 1500pm; daughter arrived to bedside and updated.

## 2022-07-29 NOTE — Progress Notes (Signed)
Daily Progress Note   Patient Name: DELLORA JOYCE       Date: 07/29/2022 DOB: 1940/04/14  Age: 82 y.o. MRN#: [id number] Attending Physician: Cathren Harsh, MD Primary Care Physician: [name], MD Admit Date: 07/26/2022  Reason for Consultation/Follow-up: Establishing goals of care  Subjective: Unresponsive, no family at bedside  Length of Stay: 2  Current Medications: Scheduled Meds:   clopidogrel  75 mg Oral Daily   cyanocobalamin  1,000 mcg Intramuscular Q1200   feeding supplement (PROSource TF20)  60 mL Per Tube Daily   folic acid  1 mg Intravenous Daily   free water  100 mL Per Tube Q4H   heparin  5,000 Units Subcutaneous Q8H   insulin aspart  0-5 Units Subcutaneous QHS   insulin aspart  0-9 Units Subcutaneous TID WC   leptospermum manuka honey  1 Application Topical Daily   levothyroxine  137 mcg Oral Q0600   simvastatin  40 mg Oral q1800   sodium chloride flush  3 mL Intravenous Q12H   sodium chloride flush  3 mL Intravenous Q12H    Continuous Infusions:  sodium chloride     azithromycin Stopped (07/28/22 1905)   cefTRIAXone (ROCEPHIN)  IV Stopped (07/28/22 2000)   feeding supplement (OSMOLITE 1.5 CAL) 1,000 mL (07/29/22 1153)   sodium phosphate 15 mmol in dextrose 5 % 250 mL infusion 15 mmol (07/29/22 0931)    PRN Meds: sodium chloride, acetaminophen **OR** acetaminophen, albuterol, bisacodyl, hydrALAZINE, morphine injection, ondansetron **OR** ondansetron (ZOFRAN) IV, sodium chloride flush  Physical Exam Constitutional:      General: She is not in acute distress.    Appearance: She is ill-appearing.     Comments: No response to voice or gentle touch, furrows brow to sternal rub  Pulmonary:     Effort: Pulmonary effort is normal.  Skin:    General: Skin is warm  and dry.             Vital Signs: BP (!) 146/69 (BP Location: Left Arm)   Pulse 88   Temp 99.7 F (37.6 C) (Oral)   Resp (!) 22   Ht 5\' 4"  (1.626 m)   Wt 79.4 kg   SpO2 100%   BMI 30.05 kg/m  SpO2: SpO2: 100 % O2 Device: O2 Device: Room Air O2 Flow Rate:    Intake/output summary:  Intake/Output Summary (Last 24 hours) at 07/29/2022 1331 Last data filed at 07/29/2022 1100 Gross per 24 hour  Intake 4227.17 ml  Output 1550 ml  Net 2677.17 ml   LBM: Last BM Date : 07/29/22 Baseline Weight: Weight: 79.4 kg Most recent weight: Weight: 79.4 kg   Patient Active Problem List   Diagnosis Date Noted   UTI (urinary tract infection) 07/28/2022   Pressure injury of skin 07/27/2022   Hypoglycemia 07/27/2022   CAP (community acquired pneumonia) 07/27/2022   Hyponatremia 07/27/2022   Malnutrition of moderate degree 07/27/2022   Acute metabolic encephalopathy 07/26/2022   Generalized weakness 09/24/2020   Overactive bladder 05/13/2020   Multiple myeloma (HCC) 10/13/2019   Goals of care, counseling/discussion 10/13/2019   Multiple myeloma not having achieved remission (HCC) 10/13/2019   Monoclonal gammopathy 09/12/2019   CKD stage  3a, GFR 45-59 ml/min (HCC) 04/18/2019   Vitamin D deficiency 04/17/2019   B12 deficiency 04/12/2018   Normocytic anemia 04/12/2018   Carotid artery disease without cerebral infarction [facility]) 04/12/2018   Gait disorder 01/11/2018   Diabetic neuropathy (HCC) 01/10/2018   CAD (coronary artery disease) 11/23/2017   SVT (supraventricular tachycardia) 10/05/2017   History of colonic polyps    GERD (gastroesophageal reflux disease) 03/13/2014   Hyperlipidemia    Type 2 diabetes mellitus with diabetic neuropathy, unspecified (HCC)    OA (osteoarthritis) of knee    Hypothyroidism    Hypertension     Palliative Care Assessment & Plan   HPI: 82 y.o. female  with past medical history of MM on Revlimid, DM, obesity, HTN and hypothyroidism  admitted on  07/26/2022 with hypoglycemia and confusion. UA and CXR consistent with infection, antibiotics started. CT revealed cerebellar infarcts; per neurology, likely incidental finding.  EEG negative. Patient with ongoing poor MS and poor PO intake. PMT consulted to discuss GOC.   Assessment: No improvement in status - mental status remains very poor. No family at bedside. Call to daughter Iris - update provided. Discussed tube feeding has been started.  Review plan for family meeting tomorrow - this will include daughters Iris and Edrick Oh over the phone, daughter [name] is coming in from [place] and will be in person. Son Zoe Lan does not plan to participate.   Recommendations/Plan: Time for outcomes Planning for family meeting tomorrow 6/21 Friday at 12:00  Code Status: Full code  Care plan was discussed with patient's daughter Iris and RN  Thank you for allowing the Palliative Medicine Team to assist in the care of this patient.   *Please note that this is a verbal dictation therefore any spelling or grammatical errors are due to the "[employer] One" system interpretation.  Gerlean Ren, DNP, [facility] Palliative Medicine Team Team Phone # [phone]  Pager [phone]

## 2022-07-29 NOTE — Progress Notes (Signed)
STROKE TEAM PROGRESS NOTE   INTERVAL HISTORY Her son is at the bedside.  Patient is in bed in no apparent distress.  She is still somnolent.   She however can be aroused with stimulation and opens eyes and mumbles a few words but does not carry out a conversation or follow any commands.  She moves all 4 extremities purposefully and localizes to pain. Vitals:   07/29/22 0451 07/29/22 0731 07/29/22 0800 07/29/22 1113  BP: (!) 149/77 (!) 155/85  (!) 146/69  Pulse: 88 97  88  Resp: 18 20 20  (!) 22  Temp: 99 F (37.2 C) (!) 100.4 F (38 C)  99.7 F (37.6 C)  TempSrc: Oral Axillary  Oral  SpO2: 100% 99%  100%  Weight:      Height:       CBC:  Recent Labs  Lab 07/26/22 0740 07/26/22 0748 07/28/22 0208 07/29/22 0411  WBC 5.3   < > 6.5 7.3  NEUTROABS 3.3  --   --   --   HGB 9.8*   < > 8.4* 9.5*  HCT 31.3*   < > 26.2* 29.9*  MCV 95.7   < > 90.0 93.1  PLT 117*   < > 152 222   < > = values in this interval not displayed.   Basic Metabolic Panel:  Recent Labs  Lab 07/28/22 0208 07/29/22 0411  NA 134* 133*  K 3.9 4.6  CL 109 109  CO2 17* 16*  GLUCOSE 220* 217*  BUN 7* 10  CREATININE 1.00 1.07*  CALCIUM 7.3* 7.7*  MG  --  1.3*  PHOS  --  1.7*   Lipid Panel:  Recent Labs  Lab 07/26/22 0944  CHOL 107  TRIG 143  HDL 31*  CHOLHDL 3.5  VLDL 29  LDLCALC 47   HgbA1c:  Recent Labs  Lab 07/26/22 0944  HGBA1C 6.4*   Urine Drug Screen:  Recent Labs  Lab 07/26/22 1005  LABOPIA POSITIVE*  COCAINSCRNUR NONE DETECTED  LABBENZ NONE DETECTED  AMPHETMU NONE DETECTED  THCU NONE DETECTED  LABBARB NONE DETECTED    Alcohol Level  Recent Labs  Lab 07/26/22 0756  ETH <10    IMAGING past 24 hours DG Abd Portable 1V  Result Date: 07/28/2022 CLINICAL DATA:  Feeding tube placement EXAM: PORTABLE ABDOMEN - 1 VIEW COMPARISON:  None Available. FINDINGS: Feeding tube tip is in the distal stomach. Mild left basilar atelectasis. Limited evaluation of the right hemithorax due to  patient arm position. Visualized portions of the abdomen are unremarkable. IMPRESSION: Feeding tube tip is in the distal stomach. Electronically Signed   By: Allegra Lai M.D.   On: 07/28/2022 14:26    PHYSICAL EXAM  Temp:  [99 F (37.2 C)-100.5 F (38.1 C)] 99.7 F (37.6 C) (06/20 1113) Pulse Rate:  [88-109] 88 (06/20 1113) Resp:  [18-22] 22 (06/20 1113) BP: (115-156)/(47-85) 146/69 (06/20 1113) SpO2:  [97 %-100 %] 100 % (06/20 1113)  General - Well nourished, well developed, in no apparent distress. Cardiovascular - Regular rhythm and rate.  Mental Status -  She is very somnolent.  Eyes are closed she does not open eyes to voice or stimulation.  Eyes are midline, PERRL.  Face appears symmetric.  She does not follow commands or have any verbal output.  She does withdraw in all 4 extremities quite purposefully and briskly without focal weakness  Motor Strength -withdraws in all 4 extremities to noxious stimuli.  Bulk was normal and fasciculations were  absent.   Motor Tone - Muscle tone was assessed at the neck and appendages and was normal.  Sensory -response to noxious stimuli  Coordination -unable to assess due to AMS  Gait and Station - deferred.  ASSESSMENT/PLAN Ms. BERYLE BADAL is a 82 y.o. female with history of hypertension, hyperlipidemia, hypothyroidism, history of cancer, diabetes, GERD, glaucoma, CAD, neuropathy, CKD presenting with recurrent hypoglycemia events, altered mental status and a right gaze deviation.   Stroke: Incidental punctate ischemia bilateral cerebellar unlikely to explain her altered mental status which is likely multifactorial from encephalopathy Etiology: In the setting of hypo-glycemic events, infection, dehydration, hypotension Code Stroke  CT head No acute abnormality. ASPECTS 10.    CTA head & neck no LVO.  Mild intracranial atherosclerosis.  50% right ICA stenosis MRI punctate acute ischemia in bilateral cerebral hemispheres 2D Echo EF  60 to 65%.  LV grade 1 diastolic dysfunction Carotid ultrasound ending LDL 47 HgbA1c 6.4 VTE prophylaxis -heparin subcu    Diet   Diet NPO time specified   Aspirin 81 mg prior to admission, now on Plavix 75 mg Therapy recommendations: Pending Disposition: Pending  Hypertension Home meds: Losartan 25 mg, metoprolol 12.5 mg twice daily Stable Permissive hypertension (OK if < 220/120) but gradually normalize in 5-7 days Long-term BP goal normotensive  Hyperlipidemia Home meds: Simvastatin 40 mg, resumed in hospital LDL 47, goal < 70 Continue statin at discharge  Diabetes type II UnControlled Hypoglycemic events Home meds: Glipizide 5 mg, metformin 1000 mg twice daily HgbA1c 6.4, goal < 7.0 CBGs Recent Labs    07/29/22 0405 07/29/22 0738 07/29/22 1145  GLUCAP 212* 277* 207*    SSI Will need close PCP follow-up after discharge  UTI  CAP On ceftriaxone and azithromycin Managed by primary  Other Stroke Risk Factors Advanced Age >/= 46  Obesity, Body mass index is 30.05 kg/m., BMI >/= 30 associated with increased stroke risk, recommend weight loss, diet and exercise as appropriate   Other Active Problems Hypothyroidism Hyponatremia Anemia CKD Multiple myeloma  Hospital day # 2   Patient presented with altered mental status with decreased responsiveness in the setting of episodes of recurrent hypoglycemia likely metabolic encephalopathy and MRI scan shows tiny punctate bilateral cerebellar infarcts which are unlikely to explain her mental status which is likely multifactorial with a combination of possible UTI, pneumonia, antibiotics and recurrent hypoglycemia.  Recommend continue ongoing medical therapy as per primary team for treatment for reversible medical abnormalities.  EEG showing has diffuse encephalopathy without any definite evidence to suggest seizures.  Continue Plavix for stroke prevention due to silent strokes.  Maintain aggressive risk factor  modification.  Discussed with son at the bedside and answered questions.  Discussed with Dr. Isidoro Donning.  Stroke team will sign off.    Delia Heady, MD Medical Director [facility] Pager: [phone] 07/29/2022 1:09 PM   To contact Stroke Continuity provider, please refer to [email or web address]. After hours, contact General Neurology

## 2022-07-29 NOTE — Inpatient Diabetes Management (Signed)
Inpatient Diabetes Program Recommendations  AACE/ADA: New Consensus Statement on Inpatient Glycemic Control (2015)  Target Ranges:  Prepandial:   less than 140 mg/dL      Peak postprandial:   less than 180 mg/dL (1-2 hours)      Critically ill patients:  140 - 180 mg/dL   Lab Results  Component Value Date   GLUCAP 207 (H) 07/29/2022   HGBA1C 6.4 (H) 07/26/2022    Review of Glycemic Control  Latest Reference Range & Units 07/28/22 07:36 07/28/22 14:06 07/28/22 16:36 07/28/22 20:34 07/28/22 23:45 07/29/22 04:05 07/29/22 07:38 07/29/22 11:45  Glucose-Capillary 70 - 99 mg/dL 161 (H) 096 (H) 045 (H) 215 (H) 250 (H) 212 (H) 277 (H) 207 (H)   Diabetes history: DM 2 Outpatient Diabetes medications:  Decadron 20 mg QTuesday Glipizide 5 mg bid Metformin 1000 mg bid Current orders for Inpatient glycemic control:  Novolog 0-9 tid + hs  Tube Feeds started Osmolite 50 ml/hour  Inpatient Diabetes Program Recommendations:    -   Change Novolog Correction to 0-9 units Q4 hours Due to Tube Feeds -   Add Novolog 2 units Q4 hours Tube Feed Coverage (Do not give if Tube Feeds are stopped or held for any reason)  Thanks,  Christena Deem RN, MSN, BC-ADM Inpatient Diabetes Coordinator Team Pager [phone] (8a-5p)

## 2022-07-30 DIAGNOSIS — Z7189 Other specified counseling: Secondary | ICD-10-CM | POA: Diagnosis not present

## 2022-07-30 DIAGNOSIS — G9341 Metabolic encephalopathy: Secondary | ICD-10-CM | POA: Diagnosis not present

## 2022-07-30 DIAGNOSIS — Z66 Do not resuscitate: Secondary | ICD-10-CM

## 2022-07-30 DIAGNOSIS — E440 Moderate protein-calorie malnutrition: Secondary | ICD-10-CM | POA: Diagnosis not present

## 2022-07-30 DIAGNOSIS — Z515 Encounter for palliative care: Secondary | ICD-10-CM | POA: Diagnosis not present

## 2022-07-30 DIAGNOSIS — J189 Pneumonia, unspecified organism: Secondary | ICD-10-CM | POA: Diagnosis not present

## 2022-07-30 DIAGNOSIS — E162 Hypoglycemia, unspecified: Secondary | ICD-10-CM | POA: Diagnosis not present

## 2022-07-30 DIAGNOSIS — R569 Unspecified convulsions: Secondary | ICD-10-CM | POA: Diagnosis not present

## 2022-07-30 LAB — CBC
HCT: 24.3 % — ABNORMAL LOW (ref 36.0–46.0)
Hemoglobin: 7.9 g/dL — ABNORMAL LOW (ref 12.0–15.0)
MCH: 29.3 pg (ref 26.0–34.0)
MCHC: 32.5 g/dL (ref 30.0–36.0)
MCV: 90 fL (ref 80.0–100.0)
Platelets: 220 10*3/uL (ref 150–400)
RBC: 2.7 MIL/uL — ABNORMAL LOW (ref 3.87–5.11)
RDW: 16.5 % — ABNORMAL HIGH (ref 11.5–15.5)
WBC: 6.8 10*3/uL (ref 4.0–10.5)
nRBC: 0.4 % — ABNORMAL HIGH (ref 0.0–0.2)

## 2022-07-30 LAB — BASIC METABOLIC PANEL
Anion gap: 7 (ref 5–15)
BUN: 15 mg/dL (ref 8–23)
CO2: 16 mmol/L — ABNORMAL LOW (ref 22–32)
Calcium: 8 mg/dL — ABNORMAL LOW (ref 8.9–10.3)
Chloride: 113 mmol/L — ABNORMAL HIGH (ref 98–111)
Creatinine, Ser: 0.99 mg/dL (ref 0.44–1.00)
GFR, Estimated: 58 mL/min — ABNORMAL LOW (ref >60)
Glucose, Bld: 243 mg/dL — ABNORMAL HIGH (ref 70–99)
Potassium: 4.3 mmol/L (ref 3.5–5.1)
Sodium: 136 mmol/L (ref 135–145)

## 2022-07-30 LAB — GLUCOSE, CAPILLARY
Glucose-Capillary: 211 mg/dL — ABNORMAL HIGH (ref 70–99)
Glucose-Capillary: 214 mg/dL — ABNORMAL HIGH (ref 70–99)
Glucose-Capillary: 225 mg/dL — ABNORMAL HIGH (ref 70–99)
Glucose-Capillary: 238 mg/dL — ABNORMAL HIGH (ref 70–99)
Glucose-Capillary: 256 mg/dL — ABNORMAL HIGH (ref 70–99)
Glucose-Capillary: 260 mg/dL — ABNORMAL HIGH (ref 70–99)
Glucose-Capillary: 280 mg/dL — ABNORMAL HIGH (ref 70–99)

## 2022-07-30 LAB — MAGNESIUM: Magnesium: 1.6 mg/dL — ABNORMAL LOW (ref 1.7–2.4)

## 2022-07-30 LAB — PHOSPHORUS: Phosphorus: 3 mg/dL (ref 2.5–4.6)

## 2022-07-30 MED ORDER — MAGNESIUM SULFATE 50 % IJ SOLN
4.0000 g | Freq: Once | INTRAVENOUS | Status: AC
  Administered 2022-07-30: 4 g via INTRAVENOUS
  Filled 2022-07-30: qty 8

## 2022-07-30 MED ORDER — METOPROLOL TARTRATE 5 MG/5ML IV SOLN
5.0000 mg | Freq: Once | INTRAVENOUS | Status: AC
  Administered 2022-07-30: 5 mg via INTRAVENOUS
  Filled 2022-07-30: qty 5

## 2022-07-30 NOTE — Progress Notes (Signed)
Daily Progress Note   Patient Name: Kim Mills       Date: 07/30/2022 DOB: 17-Feb-1940  Age: 82 y.o. MRN#: [id number] Attending Physician: Cathren Harsh, MD Primary Care Physician: [name], MD Admit Date: 07/26/2022  Reason for Consultation/Follow-up: Establishing goals of care  Subjective: More alert today, follows some commands, unable to communicate  Length of Stay: 3  Current Medications: Scheduled Meds:   clopidogrel  75 mg Oral Daily   cyanocobalamin  1,000 mcg Intramuscular Q1200   feeding supplement (PROSource TF20)  60 mL Per Tube Daily   folic acid  1 mg Intravenous Daily   free water  100 mL Per Tube Q4H   heparin  5,000 Units Subcutaneous Q8H   insulin aspart  0-5 Units Subcutaneous QHS   insulin aspart  0-9 Units Subcutaneous TID WC   leptospermum manuka honey  1 Application Topical Daily   levothyroxine  137 mcg Oral Q0600   simvastatin  40 mg Oral q1800   sodium chloride flush  3 mL Intravenous Q12H   sodium chloride flush  3 mL Intravenous Q12H    Continuous Infusions:  sodium chloride     azithromycin Stopped (07/29/22 2001)   cefTRIAXone (ROCEPHIN)  IV Stopped (07/29/22 1815)   feeding supplement (OSMOLITE 1.5 CAL) 50 mL/hr at 07/30/22 0337    PRN Meds: sodium chloride, acetaminophen **OR** acetaminophen, albuterol, bisacodyl, hydrALAZINE, morphine injection, ondansetron **OR** ondansetron (ZOFRAN) IV, sodium chloride flush  Physical Exam Constitutional:      General: She is not in acute distress.    Appearance: She is ill-appearing.     Comments: Squeezes left hand to request but doesn't let go to request, unable to squeeze with R hand, does not blink or move lower extremities to command, tracks me with eyes  Pulmonary:     Effort: Pulmonary effort  is normal.  Skin:    General: Skin is warm and dry.             Vital Signs: BP (!) 145/75 (BP Location: Left Arm)   Pulse 76   Temp 97.7 F (36.5 C) (Oral)   Resp (!) 22   Ht 5\' 4"  (1.626 m)   Wt 79.4 kg   SpO2 100%   BMI 30.05 kg/m  SpO2: SpO2: 100 % O2 Device: O2 Device: Room Air O2 Flow Rate:    Intake/output summary:  Intake/Output Summary (Last 24 hours) at 07/30/2022 1755 Last data filed at 07/30/2022 0900 Gross per 24 hour  Intake 900 ml  Output 400 ml  Net 500 ml    LBM: Last BM Date : 07/30/22 Baseline Weight: Weight: 79.4 kg Most recent weight: Weight: 79.4 kg   Patient Active Problem List   Diagnosis Date Noted   UTI (urinary tract infection) 07/28/2022   Pressure injury of skin 07/27/2022   Hypoglycemia 07/27/2022   CAP (community acquired pneumonia) 07/27/2022   Hyponatremia 07/27/2022   Malnutrition of moderate degree 07/27/2022   Acute metabolic encephalopathy 07/26/2022   Generalized weakness 09/24/2020   Overactive bladder 05/13/2020   Multiple myeloma (HCC) 10/13/2019   Goals of care, counseling/discussion 10/13/2019   Multiple myeloma not having achieved remission (HCC) 10/13/2019  Monoclonal gammopathy 09/12/2019   CKD stage 3a, GFR 45-59 ml/min (HCC) 04/18/2019   Vitamin D deficiency 04/17/2019   B12 deficiency 04/12/2018   Normocytic anemia 04/12/2018   Carotid artery disease without cerebral infarction [facility]) 04/12/2018   Gait disorder 01/11/2018   Diabetic neuropathy (HCC) 01/10/2018   CAD (coronary artery disease) 11/23/2017   SVT (supraventricular tachycardia) 10/05/2017   History of colonic polyps    GERD (gastroesophageal reflux disease) 03/13/2014   Hyperlipidemia    Type 2 diabetes mellitus with diabetic neuropathy, unspecified (HCC)    OA (osteoarthritis) of knee    Hypothyroidism    Hypertension     Palliative Care Assessment & Plan   HPI: 82 y.o. female  with past medical history of MM on Revlimid, DM, obesity, HTN  and hypothyroidism  admitted on 07/26/2022 with hypoglycemia and confusion. UA and CXR consistent with infection, antibiotics started. CT revealed cerebellar infarcts; per neurology, likely incidental finding.  EEG negative. Patient with ongoing poor MS and poor PO intake. PMT consulted to discuss GOC.   Assessment: Patient with some improvement in mental status today - more alert and follows some commands. Remains unable to meaningfully communicate.   Meeting today with multiple family members to include 3 daughters, 1 son, and grandchildren.   I introduced Palliative Medicine as specialized medical care for people living with serious illness. It focuses on providing relief from the symptoms and stress of a serious illness. The goal is to improve quality of life for both the patient and the family.  Family shares patient had been living in her home with her children rotating staying with her and caring for her.  Her children share of a decline over the past few weeks to month.  She was spending most of her time sleeping and not eating much at all.  They tell me her appetite has been declining for a while but significantly declined over the past few weeks.  They tell me she has been bedbound for a long time.  They tell me her mental status is worsened over the past few weeks as well and they assumed she had worsening of her dementia.   We discussed patient's current illness and what it means in the larger context of patient's on-going co-morbidities.  Natural disease trajectory and expectations at EOL were discussed.  We discussed patient's ongoing AMS and multiple contributors.  We discussed likely patient will not return to her baseline and have new worsened state.  I attempted to elicit values and goals of care important to the patient.    The difference between aggressive medical intervention and comfort care was considered in light of the patient's goals of care.   Advance directives, concepts  specific to code status, artificial feeding and hydration, and rehospitalization were considered and discussed.  After significant discussion and all questions answered family all agreed patient would want DNR/DNI status.  We also discussed patient's current dependence on artificial nutrition.  Family would like to continue this for the short term but would not want to have long-term PEG placed.  Discussed with family how to move forward with her care -continuing current measures and allowing time for outcomes versus focusing on comfort and quality of life.  Family would like to continue current measures including continuing medications and tube feeding over the weekend and then meet again on Monday and see how patient is doing.  If no improvement or any decline likely to transition to comfort measures only.  We briefly discussed  potential support of hospice at home.  Discussed with family the importance of continued conversation with family and the medical providers regarding overall plan of care and treatment options, ensuring decisions are within the context of the patient's values and GOCs.    Questions and concerns were addressed. The family was encouraged to call with questions or concerns.    Recommendations/Plan: CODE STATUS changed to DNR/DNI Family all agrees patient would not want PEG tube placed Continue current interventions with medications and artificial nutrition over the weekend, will follow-up with family again on Monday and see how the weekend went -if no improvement or any decline likely to transition to comfort measures only Hospice support introduced  Code Status: DNR  Care plan was discussed with multiple family members, Dr. Isidoro Donning, RN  Thank you for allowing the Palliative Medicine Team to assist in the care of this patient.   *Please note that this is a verbal dictation therefore any spelling or grammatical errors are due to the "[employer]" system  interpretation.  Gerlean Ren, DNP, [facility] Palliative Medicine Team Team Phone # [phone]  Pager [phone]

## 2022-07-30 NOTE — Progress Notes (Signed)
Physical Therapy Treatment Patient Details Name: Kim Mills MRN: [id number] DOB: 1940/04/27 Today's Date: 07/30/2022   History of Present Illness Pt is a 82 y/o F admitted on 07/26/22 after presenting with hypoglycemia & confusion. Pt is being treated for acute metabolic encephalopathy. MRI did show "punctate foci of acute ischemia within both cerebellar hemispheres". PMH: multiple myeloma, DM, obesity, HTN, hypothyroidism    PT Comments    Pt in sidelying on arrival and appears drowsy.  Pt is making slow progress but able to move to edge of bed and perform sitting and balance activities in sitting.  Emphasis placed on posture and forward weight shifting this session.  Pt sat edge of bed x 15 min.  She was able to squeeze my hand today with cues and move BUE but not to cues. She had x 1 bout of stool incontinence during session.    Recommendations for follow up therapy are one component of a multi-disciplinary discharge planning process, led by the attending physician.  Recommendations may be updated based on patient status, additional functional criteria and insurance authorization.  Follow Up Recommendations  Can patient physically be transported by private vehicle: No    Assistance Recommended at Discharge Frequent or constant Supervision/Assistance  Patient can return home with the following Two people to help with walking and/or transfers;Two people to help with bathing/dressing/bathroom;Direct supervision/assist for medications management;Help with stairs or ramp for entrance;Assistance with feeding;Assist for transportation;Direct supervision/assist for financial management;Assistance with cooking/housework   Equipment Recommendations  None recommended by PT (TBD at next venue)    Recommendations for Other Services       Precautions / Restrictions Precautions Precautions: Fall Restrictions Weight Bearing Restrictions: No     Mobility  Bed Mobility Overal bed mobility:  Needs Assistance Bed Mobility: Rolling, Supine to Sit, Sit to Supine Rolling: Total assist, +2 for physical assistance   Supine to sit: Total assist, +2 for physical assistance Sit to supine: Total assist, +2 for physical assistance   General bed mobility comments: Pt grimmaces with movement to edge of bed, She required total assistance to advances LEs and elevate trunk in sitting.  Initially presents with posterior lean.  PTA had patient place B hand on her knees to facilitate weight shifting forward.  She was able to maintain sitting edge of bed with intermittent supervision at time req mod assistance.  Pt sat edge of bed x 10 min.  Performed weight shifting to R and L and forward.  Gave tactile cues for head control.  Pt very stiff.    Transfers                        Ambulation/Gait                   Stairs             Wheelchair Mobility    Modified Rankin (Stroke Patients Only)       Balance Overall balance assessment: Needs assistance   Sitting balance-Leahy Scale: Poor Sitting balance - Comments: intermittent mod assistance is still needed and total assistance to come to sitting.                                    Cognition Arousal/Alertness: Lethargic Behavior During Therapy: Flat affect Overall Cognitive Status: Difficult to assess  General Comments: Pt did follow commands to squeeze therapist's hand with her L hand, minimal on R hand.  Pt was noted to move B UE but not to command.  Was scanning room intermittently with eyes open when mentioning her dog ("ghost" is his name, he is a large white pit bull) .  Pt was able to sit indepedently after facilitation to weight shift forward.        Exercises      General Comments        Pertinent Vitals/Pain Pain Assessment Pain Assessment: Faces Faces Pain Scale: Hurts a little bit Pain Location: generalized with BUE stretching  and cervical extension Pain Descriptors / Indicators: Grimacing, Guarding, Moaning Pain Intervention(s): Monitored during session, Repositioned    Home Living                          Prior Function            PT Goals (current goals can now be found in the care plan section) Acute Rehab PT Goals Patient Stated Goal: pt to get better Potential to Achieve Goals: Fair Progress towards PT goals: Progressing toward goals    Frequency    Min 3X/week      PT Plan Current plan remains appropriate    Co-evaluation              AM-PAC PT "6 Clicks" Mobility   Outcome Measure  Help needed turning from your back to your side while in a flat bed without using bedrails?: Total Help needed moving from lying on your back to sitting on the side of a flat bed without using bedrails?: Total Help needed moving to and from a bed to a chair (including a wheelchair)?: Total Help needed standing up from a chair using your arms (e.g., wheelchair or bedside chair)?: Total Help needed to walk in hospital room?: Total Help needed climbing 3-5 steps with a railing? : Total 6 Click Score: 6    End of Session   Activity Tolerance: Patient tolerated treatment well Patient left: in bed;with call bell/phone within reach;with bed alarm set (with patient placed in chair position.) Nurse Communication: Mobility status PT Visit Diagnosis: Difficulty in walking, not elsewhere classified (R26.2);Other abnormalities of gait and mobility (R26.89)     Time: 2595-6387 PT Time Calculation (min) (ACUTE ONLY): 37 min  Charges:  $Therapeutic Activity: 23-37 mins                     Kim Mills , PTA Acute Rehabilitation Services Office [phone]    Kim Mills Artis Delay 07/30/2022, 1:16 PM

## 2022-07-30 NOTE — Progress Notes (Addendum)
[facility]                                                                              Kim Mills, is a 82 y.o. female, DOB - [birth year], WJX:[id number] Admit date - 07/26/2022    Outpatient Primary MD for the patient is [name], MD  LOS - 3  days  Chief Complaint  Patient presents with   Code Stroke       Brief summary   Patient is a 82 year old female with multiple myeloma on Revlimid, DM, obesity, HTN and hypothyroidism who presented with hypoglycemia and confusion.  The week prior, patient had UTI, treated with Macrobid, there was some noted somnolence with taking Macrobid suspected interaction with Revlimid.  Patient completed antibiotics, was back to baseline and then on the day prior to admission had diarrhea and on the morning of admission was unresponsive.  EMS found her with glucose of 30. Patient was admitted for further workup. Assessment & Plan    Principal Problem:   Acute metabolic encephalopathy superimposed on dementia -RPR, TSH, B12 low normal, folate low, B1 in process -UA positive for UTI, urine culture showing more than 100,000 colonies of gram-negative rods, on antibiotics -CXR showed ill-defined opacities in the left lung base and mid right lung, reflecting atelectasis or pneumonia -Continue folic acid, B12 368, placed on vitamin B12 IM daily, -MRI brain showed punctate foci of acute ischemia in both cerebellar hemispheres.  No hemorrhage or mass effect.  Multiple old cerebellar infarcts and chronic small vessel ischemia -EEG negative -Per family at baseline she has been declining in the last 1 month, sleeping more and eating less in the last 2 weeks. -Palliative medicine consulted for GOC-> DNR/DNI  Active Problems: Acute CVA both cerebellar hemispheres -MRI brain showed punctate foci of acute ischemia in both cerebellar hemispheres.  No hemorrhage or mass effect.  Multiple old cerebellar infarcts and chronic small vessel  ischemia -Neurology following, recommended aspirin 81 mg daily -CTA head and neck showed mild intracranial atherosclerosis without large vessel occlusion or significant proximal stenosis, 50% right ICA origin stenosis -2D echo showed EF of 60 to 65%, G1 DD -LDL 47, hemoglobin A1c 6.4 -Still somewhat somnolent, core track placed and started on tube feeds -Palliative goals of care today    E. coli UTI (urinary tract infection) -Urine culture showed more than 100,000 colonies of E. coli, continue IV Rocephin      Hypoglycemia, dysphagia -Per family, she has been eating much less over the last several weeks and sleeping a lot.  Hypoglycemic to 30s when found by EMS. - Core track+, currently on tube feeds       CAP (community acquired pneumonia) - Continue Rocephin and azithromycin    Hyponatremia -Improved, sodium 136 today  Hypomagnesemia -Magnesium 1.6, replaced   Multiple myeloma not having achieved remission (HCC) Follows with Dr. Ellin Saba, on Revlimid -Palliative medicine consult   CKD stage 3a, GFR 45-59 ml/min (HCC) Creatinine stable within baseline range   Normocytic anemia Due to multiple myeloma, hb 11-12 a few months ago. Hemoglobin down to 7.9, follow closely, transfuse for Hb less than 7  Hyperlipidemia - Hold simvastatin until able to take p.o.   Hypertension BP currently stable - Hold losartan, metoprolol   Hypothyroidism TSH normal - Continue levothyroxine   Type 2 diabetes mellitus with diabetic neuropathy, unspecified (HCC) -Presented with hypoglycemia -Hemoglobin A1c 6.4 -Continue sliding scale insulin sensitive while inpatient     Pressure injury of skin Stage III, sacrum, present on admission - WOC nurse consult   Moderate protein calorie malnutrition, hypoalbuminemia < 1.9 Nutrition Problem: Moderate Malnutrition Etiology: chronic illness, multiple myeloma, Signs/Symptoms: mild muscle depletion, mild fat depletion Estimated body  mass index is 30.05 kg/m as calculated from the following:   Height as of this encounter: 5\' 4"  (1.626 m).   Weight as of this encounter: 79.4 kg.  Code Status: Full code DVT Prophylaxis:  Place and maintain sequential compression device Start: 07/28/22 0846 heparin injection 5,000 Units Start: 07/26/22 1745 SCDs Start: 07/26/22 1654 Place TED hose Start: 07/26/22 1654   Level of Care: Level of care: Telemetry Medical Family Communication: Updated patient's daughter at the bedside today  Disposition Plan:      Remains inpatient appropriate:      Procedures:    Consultants:   Neurology Palliative medicine  Antimicrobials:   Anti-infectives (From admission, onward)    Start     Dose/Rate Route Frequency Ordered Stop   07/27/22 1900  cefTRIAXone (ROCEPHIN) 2 g in sodium chloride 0.9 % 100 mL IVPB        2 g 200 mL/hr over 30 Minutes Intravenous Every 24 hours 07/27/22 1029     07/26/22 1915  fluconazole (DIFLUCAN) tablet 200 mg  Status:  Discontinued        200 mg Oral Daily 07/26/22 1825 07/27/22 1029   07/26/22 1900  cefTRIAXone (ROCEPHIN) 1 g in sodium chloride 0.9 % 100 mL IVPB  Status:  Discontinued        1 g 200 mL/hr over 30 Minutes Intravenous Every 24 hours 07/26/22 1807 07/27/22 1029   07/26/22 1900  azithromycin (ZITHROMAX) 500 mg in sodium chloride 0.9 % 250 mL IVPB        500 mg 250 mL/hr over 60 Minutes Intravenous Every 24 hours 07/26/22 1807     07/26/22 0945  cefTRIAXone (ROCEPHIN) 1 g in sodium chloride 0.9 % 100 mL IVPB        1 g 200 mL/hr over 30 Minutes Intravenous  Once 07/26/22 0942 07/26/22 1023   07/26/22 0945  azithromycin (ZITHROMAX) 500 mg in sodium chloride 0.9 % 250 mL IVPB        500 mg 250 mL/hr over 60 Minutes Intravenous  Once 07/26/22 0942 07/26/22 1934          Medications  clopidogrel  75 mg Oral Daily   cyanocobalamin  1,000 mcg Intramuscular Q1200   feeding supplement (PROSource TF20)  60 mL Per Tube Daily   folic acid  1  mg Intravenous Daily   free water  100 mL Per Tube Q4H   heparin  5,000 Units Subcutaneous Q8H   insulin aspart  0-5 Units Subcutaneous QHS   insulin aspart  0-9 Units Subcutaneous TID WC   leptospermum manuka honey  1 Application Topical Daily   levothyroxine  137 mcg Oral Q0600   simvastatin  40 mg Oral q1800   sodium chloride flush  3 mL Intravenous Q12H   sodium chloride flush  3 mL Intravenous Q12H      Subjective:   Kim Mills was seen and examined today.  Eyes  open today, patient tracking intermittently.  Daughter at the bedside.  No meaningful response to the commands or no verbal response.    Objective:   Vitals:   07/30/22 0300 07/30/22 0816 07/30/22 0941 07/30/22 1120  BP: (!) 124/40 (!) 158/48 135/83 (!) 126/50  Pulse: 88 89 (!) 163 74  Resp: 18 20 (!) 22 20  Temp: 99.8 F (37.7 C) 99.1 F (37.3 C) 99.6 F (37.6 C) 99.3 F (37.4 C)  TempSrc: Axillary Oral Oral Oral  SpO2: 100% 100% 100% 100%  Weight:      Height:        Intake/Output Summary (Last 24 hours) at 07/30/2022 1330 Last data filed at 07/30/2022 0900 Gross per 24 hour  Intake 1651.17 ml  Output 400 ml  Net 1251.17 ml     Wt Readings from Last 3 Encounters:  07/26/22 79.4 kg  04/22/22 79.4 kg  03/01/22 75.3 kg    Physical Exam General: Alert, eyes open otherwise minimally responsive, nonverbal  Cardiovascular: S1 S2 clear, RRR.  Respiratory: CTAB, no wheezing, rales or rhonchi Gastrointestinal: Soft, nontender, nondistended, NBS Ext: no pedal edema bilaterally Neuro: not following commands , withdraws to pain stimuli Psych: alert but not following any commands, nonverbal    Data Reviewed:  I have personally reviewed following labs    CBC Lab Results  Component Value Date   WBC 6.8 07/30/2022   RBC 2.70 (L) 07/30/2022   HGB 7.9 (L) 07/30/2022   HCT 24.3 (L) 07/30/2022   MCV 90.0 07/30/2022   MCH 29.3 07/30/2022   PLT 220 07/30/2022   MCHC 32.5 07/30/2022   RDW 16.5 (H)  07/30/2022   LYMPHSABS 1.5 07/26/2022   MONOABS 0.4 07/26/2022   EOSABS 0.1 07/26/2022   BASOSABS 0.0 07/26/2022     Last metabolic panel Lab Results  Component Value Date   NA 136 07/30/2022   K 4.3 07/30/2022   CL 113 (H) 07/30/2022   CO2 16 (L) 07/30/2022   BUN 15 07/30/2022   CREATININE 0.99 07/30/2022   GLUCOSE 243 (H) 07/30/2022   GFRNONAA 58 (L) 07/30/2022   GFRAA >60 11/12/2019   CALCIUM 8.0 (L) 07/30/2022   PHOS 3.0 07/30/2022   PROT 5.5 (L) 07/28/2022   ALBUMIN 1.9 (L) 07/28/2022   LABGLOB 2.9 07/12/2022   AGRATIO 0.9 07/12/2022   BILITOT 0.3 07/28/2022   ALKPHOS 66 07/28/2022   AST 19 07/28/2022   ALT 25 07/28/2022   ANIONGAP 7 07/30/2022    CBG (last 3)  Recent Labs    07/30/22 0413 07/30/22 0816 07/30/22 1142  GLUCAP 238* 256* 225*      Coagulation Profile: Recent Labs  Lab 07/26/22 0805  INR 1.2     Radiology Studies: I have personally reviewed the imaging studies  DG Abd Portable 1V  Result Date: 07/28/2022 CLINICAL DATA:  Feeding tube placement EXAM: PORTABLE ABDOMEN - 1 VIEW COMPARISON:  None Available. FINDINGS: Feeding tube tip is in the distal stomach. Mild left basilar atelectasis. Limited evaluation of the right hemithorax due to patient arm position. Visualized portions of the abdomen are unremarkable. IMPRESSION: Feeding tube tip is in the distal stomach. Electronically Signed   By: Allegra Lai M.D.   On: 07/28/2022 14:26       Kim Mills M.D. [facility] 07/30/2022, 1:30 PM  Available via Epic secure chat 7am-7pm After 7 pm, please refer to night coverage provider listed on amion.

## 2022-07-30 NOTE — Care Management Important Message (Signed)
Important Message  Patient Details  Name: Kim Mills MRN: [id number] Date of Birth: 11-Aug-1940   Medicare Important Message Given:  Yes     Dorena Bodo 07/30/2022, 3:18 PM

## 2022-07-30 NOTE — TOC Progression Note (Signed)
Transition of Care [facility]) - Progression Note    Patient Details  Name: Kim Mills MRN: [id number] Date of Birth: 09/16/1940  [facility] of [facility]) CM/SW Contact  Tom-Johnson, Hershal Coria, RN Phone Number: 07/30/2022, 2:07 PM  Clinical Narrative:     Patient not following command at this time. Palliative following for GOC.  CM will continue to follow as patient progresses with care towards discharge.       Expected Discharge Plan:  (to be determined)    Expected Discharge Plan and Services                                               Social Determinants of Health (SDOH) Interventions SDOH Screenings   Food Insecurity: No Food Insecurity (07/26/2022)  Housing: Medium Risk (07/26/2022)  Transportation Needs: No Transportation Needs (07/26/2022)  Utilities: Not At Risk (07/26/2022)  Alcohol Screen: Low Risk  (11/05/2020)  Depression (PHQ2-9): Medium Risk (03/29/2022)  Financial Resource Strain: Low Risk  (11/05/2020)  Physical Activity: Sufficiently Active (11/05/2020)  Social Connections: Moderately Integrated (11/05/2020)  Stress: No Stress Concern Present (11/05/2020)  Tobacco Use: Medium Risk (07/26/2022)    Readmission Risk Interventions     No data to display

## 2022-07-30 NOTE — Significant Event (Signed)
Received a call from Central telemetry that patient HR is up in the 160-170. Patient resting in bed; eyes closed, pulse checked. Dr. Isidoro Donning at bedside; EKG obtained. New orders received for metoprolol 5mg . Electrolytes replaced. Will continue to monitor.

## 2022-07-31 ENCOUNTER — Other Ambulatory Visit: Payer: Self-pay

## 2022-07-31 DIAGNOSIS — R569 Unspecified convulsions: Secondary | ICD-10-CM | POA: Diagnosis not present

## 2022-07-31 DIAGNOSIS — E162 Hypoglycemia, unspecified: Secondary | ICD-10-CM | POA: Diagnosis not present

## 2022-07-31 DIAGNOSIS — J189 Pneumonia, unspecified organism: Secondary | ICD-10-CM | POA: Diagnosis not present

## 2022-07-31 DIAGNOSIS — G9341 Metabolic encephalopathy: Secondary | ICD-10-CM | POA: Diagnosis not present

## 2022-07-31 LAB — CULTURE, BLOOD (ROUTINE X 2)
Culture: NO GROWTH
Special Requests: ADEQUATE

## 2022-07-31 LAB — GLUCOSE, CAPILLARY
Glucose-Capillary: 233 mg/dL — ABNORMAL HIGH (ref 70–99)
Glucose-Capillary: 248 mg/dL — ABNORMAL HIGH (ref 70–99)
Glucose-Capillary: 250 mg/dL — ABNORMAL HIGH (ref 70–99)
Glucose-Capillary: 258 mg/dL — ABNORMAL HIGH (ref 70–99)
Glucose-Capillary: 260 mg/dL — ABNORMAL HIGH (ref 70–99)
Glucose-Capillary: 260 mg/dL — ABNORMAL HIGH (ref 70–99)

## 2022-07-31 LAB — BASIC METABOLIC PANEL
Anion gap: 8 (ref 5–15)
BUN: 16 mg/dL (ref 8–23)
CO2: 19 mmol/L — ABNORMAL LOW (ref 22–32)
Calcium: 8.3 mg/dL — ABNORMAL LOW (ref 8.9–10.3)
Chloride: 111 mmol/L (ref 98–111)
Creatinine, Ser: 0.93 mg/dL (ref 0.44–1.00)
GFR, Estimated: >60 mL/min (ref >60)
Glucose, Bld: 248 mg/dL — ABNORMAL HIGH (ref 70–99)
Potassium: 4 mmol/L (ref 3.5–5.1)
Sodium: 138 mmol/L (ref 135–145)

## 2022-07-31 LAB — MAGNESIUM: Magnesium: 2 mg/dL (ref 1.7–2.4)

## 2022-07-31 LAB — PHOSPHORUS: Phosphorus: 3.1 mg/dL (ref 2.5–4.6)

## 2022-07-31 MED ORDER — METOPROLOL TARTRATE 5 MG/5ML IV SOLN
5.0000 mg | Freq: Once | INTRAVENOUS | Status: AC
  Administered 2022-07-31: 5 mg via INTRAVENOUS

## 2022-07-31 MED ORDER — LEVOTHYROXINE SODIUM 25 MCG PO TABS
137.0000 ug | ORAL_TABLET | Freq: Every day | ORAL | Status: DC
  Administered 2022-08-01 – 2022-08-02 (×2): 137 ug
  Filled 2022-07-31 (×2): qty 1

## 2022-07-31 MED ORDER — ACETAMINOPHEN 325 MG PO TABS
650.0000 mg | ORAL_TABLET | Freq: Four times a day (QID) | ORAL | Status: DC | PRN
  Administered 2022-07-31 – 2022-08-01 (×2): 650 mg
  Filled 2022-07-31 (×2): qty 2

## 2022-07-31 MED ORDER — ACETAMINOPHEN 650 MG RE SUPP: 650.0000 mg | Freq: Four times a day (QID) | RECTAL | Status: DC | PRN

## 2022-07-31 MED ORDER — ONDANSETRON HCL 4 MG/2ML IJ SOLN: 4.0000 mg | Freq: Four times a day (QID) | INTRAMUSCULAR | Status: DC | PRN

## 2022-07-31 MED ORDER — ONDANSETRON HCL 4 MG PO TABS: 4.0000 mg | ORAL_TABLET | Freq: Four times a day (QID) | ORAL | Status: DC | PRN

## 2022-07-31 MED ORDER — SIMVASTATIN 20 MG PO TABS
40.0000 mg | ORAL_TABLET | Freq: Every day | ORAL | Status: DC
  Administered 2022-07-31 – 2022-08-01 (×2): 40 mg
  Filled 2022-07-31 (×2): qty 2

## 2022-07-31 MED ORDER — DILTIAZEM HCL 25 MG/5ML IV SOLN
10.0000 mg | Freq: Once | INTRAVENOUS | Status: AC
  Administered 2022-07-31: 10 mg via INTRAVENOUS
  Filled 2022-07-31: qty 5

## 2022-07-31 MED ORDER — METOPROLOL TARTRATE 5 MG/5ML IV SOLN
INTRAVENOUS | Status: AC
  Filled 2022-07-31: qty 5

## 2022-07-31 MED ORDER — CLOPIDOGREL BISULFATE 75 MG PO TABS
75.0000 mg | ORAL_TABLET | Freq: Every day | ORAL | Status: DC
  Administered 2022-07-31 – 2022-08-02 (×3): 75 mg
  Filled 2022-07-31 (×2): qty 1

## 2022-07-31 NOTE — Progress Notes (Signed)
Speech Language Pathology Treatment: Dysphagia  Patient Details Name: Kim Mills MRN: [id number] DOB: 12/24/1940 Today's Date: 07/31/2022 Time: 4098-1191 SLP Time Calculation (min) (ACUTE ONLY): 20 min  Assessment / Plan / Recommendation Clinical Impression  Pt resting comfortably. Daughter, Kim Mills, at bedside. Applied cool, wet cloth to face/mouth; provided oral care. Pt did not respond to efforts to stimulate.  No spontaneous swallowing observed in reaction to oral care.  1/2 tspn water presented to anterior tongue x2; no swallow could be elicited despite max assist. Oral suctioning provided. Spoke with Kim Mills. SLP will f/u Monday and continue efforts. Palliative care is working with pt/family. Will follow for GOC.   HPI HPI: 82 yo F presented from home to the ED 6/17 with AMS, right gaze, mutism. Dx hypoglycemia, metabolic encephalopathy, possible pna. MRI: punctate foci of acute ischemia within bilateral cerebellum; multiple old cerebellar infarcts; chronic small vessel ischemia. PMHX of multiple myeloma, HLD, HTN,      SLP Plan  Continue with current plan of care      Recommendations for follow up therapy are one component of a multi-disciplinary discharge planning process, led by the attending physician.  Recommendations may be updated based on patient status, additional functional criteria and insurance authorization.    Recommendations  Diet recommendations: NPO                  Oral care QID     Dysphagia, unspecified (R13.10)     Continue with current plan of care    Kim Willow L. Samson Frederic, MA CCC/SLP Clinical Specialist - Acute Care SLP Acute Rehabilitation Services Office number [phone]  Kim Mills  07/31/2022, 2:54 PM

## 2022-07-31 NOTE — Progress Notes (Signed)
[facility]                                                                              Kim Mills, is a 82 y.o. female, DOB - 1940/03/30, ZOX:[id number] Admit date - 07/26/2022    Outpatient Primary MD for the patient is [name], MD  LOS - 4  days  Chief Complaint  Patient presents with   Code Stroke       Brief summary   Patient is a 82 year old female with multiple myeloma on Revlimid, DM, obesity, HTN and hypothyroidism who presented with hypoglycemia and confusion.  The week prior, patient had UTI, treated with Macrobid, there was some noted somnolence with taking Macrobid suspected interaction with Revlimid.  Patient completed antibiotics, was back to baseline and then on the day prior to admission had diarrhea and on the morning of admission was unresponsive.  EMS found her with glucose of 30. Patient was admitted for further workup. Assessment & Plan    Principal Problem:   Acute metabolic encephalopathy superimposed on dementia -RPR, TSH, B12 low normal, folate low, B1 in process -UA positive for UTI, UCx + E. coli  -CXR showed ill-defined opacities in the left lung base and mid right lung, reflecting atelectasis or pneumonia -Continue folic acid, B12 368, placed on vitamin B12 IM daily, -MRI brain showed punctate foci of acute ischemia in both cerebellar hemispheres.  No hemorrhage or mass effect.  Multiple old cerebellar infarcts and chronic small vessel ischemia -EEG negative -Per family at baseline she has been declining in the last 1 month, sleeping more and eating less in the last 2 weeks. -Palliative medicine consulted for GOC-> DNR/DNI -Mild improvement yesterday with opening eyes and tracking however no significant response today, 2 daughters at the bedside.  Patient has no meaningful recovery since admission, discussed with the family at the bedside today.  Family is hoping to continue current management, tube feeds and antibiotics  over the weekend.  If no significant improvement over the weekend, they are leaning towards transitioning to comfort care.   Active Problems: Acute CVA both cerebellar hemispheres -MRI brain showed punctate foci of acute ischemia in both cerebellar hemispheres.  No hemorrhage or mass effect.  Multiple old cerebellar infarcts and chronic small vessel ischemia -Neurology following, recommended aspirin 81 mg daily -CTA head and neck showed mild intracranial atherosclerosis without large vessel occlusion or significant proximal stenosis, 50% right ICA origin stenosis -2D echo showed EF of 60 to 65%, G1 DD -LDL 47, hemoglobin A1c 6.4 -Currently receiving tube feeds via core track -No significant improvement in the mental status, discussed GOC with 2 daughters at the bedside    E. coli UTI (urinary tract infection), -Continue IV Rocephin     Hypoglycemia, dysphagia -Per family, she has been eating much less over the last several weeks and sleeping a lot.  Hypoglycemic to 30s when found by EMS. - Core track+, currently on tube feeds       CAP (community acquired pneumonia) - Continue Rocephin and azithromycin, likely has aspiration   Hyponatremia -Improved  Hypomagnesemia -Improved   Multiple myeloma not having achieved remission (  HCC) Follows with Dr. Ellin Saba, on Revlimid -Palliative medicine consult   CKD stage 3a, GFR 45-59 ml/min (HCC) Creatinine stable within baseline range   Normocytic anemia Due to multiple myeloma, hb 11-12 a few months ago. Transfuse for hemoglobin less than 7  Hyperlipidemia - Hold simvastatin until able to take p.o.   Hypertension BP currently stable - Hold losartan, metoprolol   Hypothyroidism TSH normal - Continue levothyroxine   Type 2 diabetes mellitus with diabetic neuropathy, unspecified (HCC) -Presented with hypoglycemia -Hemoglobin A1c 6.4 -Continue sliding scale insulin sensitive while inpatient     Pressure injury of  skin Stage III, sacrum, present on admission - WOC nurse consult   Moderate protein calorie malnutrition, hypoalbuminemia < 1.9 Nutrition Problem: Moderate Malnutrition Etiology: chronic illness, multiple myeloma, Signs/Symptoms: mild muscle depletion, mild fat depletion Estimated body mass index is 30.16 kg/m as calculated from the following:   Height as of this encounter: 5\' 4"  (1.626 m).   Weight as of this encounter: 79.7 kg.  Code Status: Full code DVT Prophylaxis:  Place and maintain sequential compression device Start: 07/28/22 0846 heparin injection 5,000 Units Start: 07/26/22 1745 SCDs Start: 07/26/22 1654 Place TED hose Start: 07/26/22 1654   Level of Care: Level of care: Telemetry Medical Family Communication: Updated patient's daughters, Luster Landsberg and Misty Stanley at the bedside  Disposition Plan:      Remains inpatient appropriate:      Procedures:    Consultants:   Neurology Palliative medicine  Antimicrobials:   Anti-infectives (From admission, onward)    Start     Dose/Rate Route Frequency Ordered Stop   07/27/22 1900  cefTRIAXone (ROCEPHIN) 2 g in sodium chloride 0.9 % 100 mL IVPB        2 g 200 mL/hr over 30 Minutes Intravenous Every 24 hours 07/27/22 1029     07/26/22 1915  fluconazole (DIFLUCAN) tablet 200 mg  Status:  Discontinued        200 mg Oral Daily 07/26/22 1825 07/27/22 1029   07/26/22 1900  cefTRIAXone (ROCEPHIN) 1 g in sodium chloride 0.9 % 100 mL IVPB  Status:  Discontinued        1 g 200 mL/hr over 30 Minutes Intravenous Every 24 hours 07/26/22 1807 07/27/22 1029   07/26/22 1900  azithromycin (ZITHROMAX) 500 mg in sodium chloride 0.9 % 250 mL IVPB        500 mg 250 mL/hr over 60 Minutes Intravenous Every 24 hours 07/26/22 1807     07/26/22 0945  cefTRIAXone (ROCEPHIN) 1 g in sodium chloride 0.9 % 100 mL IVPB        1 g 200 mL/hr over 30 Minutes Intravenous  Once 07/26/22 0942 07/26/22 1023   07/26/22 0945  azithromycin (ZITHROMAX) 500 mg in  sodium chloride 0.9 % 250 mL IVPB        500 mg 250 mL/hr over 60 Minutes Intravenous  Once 07/26/22 0942 07/26/22 1934          Medications  clopidogrel  75 mg Per Tube Daily   cyanocobalamin  1,000 mcg Intramuscular Q1200   feeding supplement (PROSource TF20)  60 mL Per Tube Daily   folic acid  1 mg Intravenous Daily   free water  100 mL Per Tube Q4H   heparin  5,000 Units Subcutaneous Q8H   insulin aspart  0-5 Units Subcutaneous QHS   insulin aspart  0-9 Units Subcutaneous TID WC   leptospermum manuka honey  1 Application Topical Daily   [START ON 08/01/2022]  levothyroxine  137 mcg Per Tube Q0600   metoprolol tartrate       simvastatin  40 mg Per Tube q1800   sodium chloride flush  3 mL Intravenous Q12H   sodium chloride flush  3 mL Intravenous Q12H      Subjective:   Little Bashore was seen and examined today.  Not opening eyes, moaning, not responding to any verbal commands.  2 daughters at the bedside.  Objective:   Vitals:   07/31/22 0327 07/31/22 0500 07/31/22 1122 07/31/22 1301  BP: (!) 125/58  (!) 161/78 (!) 131/52  Pulse: 83  (!) 167 73  Resp: 20  20 (!) 24  Temp: 99.1 F (37.3 C)  98.6 F (37 C) 98.9 F (37.2 C)  TempSrc: Axillary  Oral Oral  SpO2: 100%  100% 99%  Weight:  79.7 kg    Height:        Intake/Output Summary (Last 24 hours) at 07/31/2022 1353 Last data filed at 07/31/2022 1059 Gross per 24 hour  Intake 1290 ml  Output 1900 ml  Net -610 ml     Wt Readings from Last 3 Encounters:  07/31/22 79.7 kg  04/22/22 79.4 kg  03/01/22 75.3 kg   Physical Exam General: Keeping eyes closed, moaning, not following any verbal commands Cardiovascular: S1 S2 clear, RRR.  Respiratory: CTAB, no wheezing Gastrointestinal: Soft, nontender, nondistended, NBS Ext: no pedal edema bilaterally Neuro: withdraws to pain stimuli Psych: does not respond to any commands, moaning    Data Reviewed:  I have personally reviewed following labs    CBC Lab  Results  Component Value Date   WBC 6.8 07/30/2022   RBC 2.70 (L) 07/30/2022   HGB 7.9 (L) 07/30/2022   HCT 24.3 (L) 07/30/2022   MCV 90.0 07/30/2022   MCH 29.3 07/30/2022   PLT 220 07/30/2022   MCHC 32.5 07/30/2022   RDW 16.5 (H) 07/30/2022   LYMPHSABS 1.5 07/26/2022   MONOABS 0.4 07/26/2022   EOSABS 0.1 07/26/2022   BASOSABS 0.0 07/26/2022     Last metabolic panel Lab Results  Component Value Date   NA 138 07/31/2022   K 4.0 07/31/2022   CL 111 07/31/2022   CO2 19 (L) 07/31/2022   BUN 16 07/31/2022   CREATININE 0.93 07/31/2022   GLUCOSE 248 (H) 07/31/2022   GFRNONAA >60 07/31/2022   GFRAA >60 11/12/2019   CALCIUM 8.3 (L) 07/31/2022   PHOS 3.1 07/31/2022   PROT 5.5 (L) 07/28/2022   ALBUMIN 1.9 (L) 07/28/2022   LABGLOB 2.9 07/12/2022   AGRATIO 0.9 07/12/2022   BILITOT 0.3 07/28/2022   ALKPHOS 66 07/28/2022   AST 19 07/28/2022   ALT 25 07/28/2022   ANIONGAP 8 07/31/2022    CBG (last 3)  Recent Labs    07/31/22 0331 07/31/22 0800 07/31/22 1223  GLUCAP 260* 258* 260*      Coagulation Profile: Recent Labs  Lab 07/26/22 0805  INR 1.2     Radiology Studies: I have personally reviewed the imaging studies  No results found.     Thad Ranger M.D. [facility] 07/31/2022, 1:53 PM  Available via Epic secure chat 7am-7pm After 7 pm, please refer to night coverage provider listed on amion.

## 2022-07-31 NOTE — Plan of Care (Signed)
  Problem: Skin Integrity: Goal: Risk for impaired skin integrity will decrease Outcome: Progressing   

## 2022-07-31 NOTE — Plan of Care (Signed)
  Problem: Clinical Measurements: Goal: Ability to maintain clinical measurements within normal limits will improve Outcome: Progressing Goal: Will remain free from infection Outcome: Progressing   Problem: Skin Integrity: Goal: Risk for impaired skin integrity will decrease Outcome: Progressing   Problem: Fluid Volume: Goal: Ability to maintain a balanced intake and output will improve Outcome: Progressing

## 2022-07-31 NOTE — Progress Notes (Signed)
Patient noted to have a heart rate of 168. Dr. Isidoro Donning Notified new orders received. EKG and vitals sign documented

## 2022-08-01 DIAGNOSIS — E162 Hypoglycemia, unspecified: Secondary | ICD-10-CM | POA: Diagnosis not present

## 2022-08-01 DIAGNOSIS — R569 Unspecified convulsions: Secondary | ICD-10-CM | POA: Diagnosis not present

## 2022-08-01 DIAGNOSIS — J189 Pneumonia, unspecified organism: Secondary | ICD-10-CM | POA: Diagnosis not present

## 2022-08-01 DIAGNOSIS — G9341 Metabolic encephalopathy: Secondary | ICD-10-CM | POA: Diagnosis not present

## 2022-08-01 LAB — GLUCOSE, CAPILLARY
Glucose-Capillary: 266 mg/dL — ABNORMAL HIGH (ref 70–99)
Glucose-Capillary: 313 mg/dL — ABNORMAL HIGH (ref 70–99)
Glucose-Capillary: 272 mg/dL — ABNORMAL HIGH (ref 70–99)
Glucose-Capillary: 260 mg/dL — ABNORMAL HIGH (ref 70–99)
Glucose-Capillary: 228 mg/dL — ABNORMAL HIGH (ref 70–99)
Glucose-Capillary: 238 mg/dL — ABNORMAL HIGH (ref 70–99)

## 2022-08-01 MED ORDER — GERHARDT'S BUTT CREAM
TOPICAL_CREAM | Freq: Two times a day (BID) | CUTANEOUS | Status: DC | PRN
  Filled 2022-08-01 (×2): qty 1

## 2022-08-01 NOTE — Progress Notes (Signed)
[facility]                                                                              Kim Mills, is a 82 y.o. female, DOB - April 03, 1940, ZOX:[id number] Admit date - 07/26/2022    Outpatient Primary MD for the patient is [name], MD  LOS - 5  days  Chief Complaint  Patient presents with   Code Stroke       Brief summary   Patient is a 82 year old female with multiple myeloma on Revlimid, DM, obesity, HTN and hypothyroidism who presented with hypoglycemia and confusion.  The week prior, patient had UTI, treated with Macrobid, there was some noted somnolence with taking Macrobid suspected interaction with Revlimid.  Patient completed antibiotics, was back to baseline and then on the day prior to admission had diarrhea and on the morning of admission was unresponsive.  EMS found her with glucose of 30. Patient was admitted for further workup. Assessment & Plan    Principal Problem:   Acute metabolic encephalopathy superimposed on dementia -RPR, TSH, B12 low normal, folate low, B1 in process -UA positive for UTI, UCx + E. coli  -CXR showed ill-defined opacities in the left lung base and mid right lung, reflecting atelectasis or pneumonia -Continue folic acid, B12 368, placed on vitamin B12 IM daily, -MRI brain showed punctate foci of acute ischemia in both cerebellar hemispheres.  No hemorrhage or mass effect.  Multiple old cerebellar infarcts and chronic small vessel ischemia -EEG negative -Per family, at baseline she has been declining in the last 1 month, sleeping more and eating less in the last 2 weeks. -Palliative medicine consulted for GOC-> DNR/DNI -No meaningful recovery since admission, has cortrack with tube feeding, received antibiotics with no change in the clinical status.   Active Problems: Acute CVA both cerebellar hemispheres -MRI brain showed punctate foci of acute ischemia in both cerebellar hemispheres.  No hemorrhage or mass  effect.  Multiple old cerebellar infarcts and chronic small vessel ischemia -Neurology following, recommended aspirin 81 mg daily -CTA head and neck showed mild intracranial atherosclerosis without large vessel occlusion or significant proximal stenosis, 50% right ICA origin stenosis -2D echo showed EF of 60 to 65%, G1 DD -LDL 47, hemoglobin A1c 6.4 -Currently receiving tube feeds via core track -No significant improvement Palliative medicine following, will address GOC in a.m.   E. coli UTI (urinary tract infection), -Continue IV Rocephin     Hypoglycemia, dysphagia -Per family, she has been eating much less over the last several weeks and sleeping a lot.   - Core track+, currently on tube feeds       CAP (community acquired pneumonia) - Continue Rocephin and azithromycin, likely has aspiration   Hyponatremia -Improved  Hypomagnesemia -Improved   Multiple myeloma not having achieved remission (HCC) Follows with Dr. Ellin Saba, on Revlimid -Palliative medicine consult   CKD stage 3a, GFR 45-59 ml/min (HCC) Creatinine stable within baseline range   Normocytic anemia Due to multiple myeloma, hb 11-12 a few months ago. Transfuse for hemoglobin less than 7  Hyperlipidemia - Hold simvastatin until able to take p.o.   Hypertension  BP currently stable - Hold losartan, metoprolol   Hypothyroidism TSH normal - Continue levothyroxine   Type 2 diabetes mellitus with diabetic neuropathy, unspecified (HCC) -Presented with hypoglycemia -Hemoglobin A1c 6.4 -Continue sliding scale insulin sensitive while inpatient     Pressure injury of skin Stage III, sacrum, present on admission - WOC nurse consult   Moderate protein calorie malnutrition, hypoalbuminemia < 1.9 Nutrition Problem: Moderate Malnutrition Etiology: chronic illness, multiple myeloma, Signs/Symptoms: mild muscle depletion, mild fat depletion Estimated body mass index is 30.16 kg/m as calculated from the  following:   Height as of this encounter: 5\' 4"  (1.626 m).   Weight as of this encounter: 79.7 kg.  Code Status: Full code DVT Prophylaxis:  Place and maintain sequential compression device Start: 07/28/22 0846 heparin injection 5,000 Units Start: 07/26/22 1745 SCDs Start: 07/26/22 1654 Place TED hose Start: 07/26/22 1654   Level of Care: Level of care: Telemetry Medical Family Communication: Updated patient's son Zollie Beckers at the bedside.   Disposition Plan:      Remains inpatient appropriate:   If no significant improvement tomorrow in clinical status, will rediscuss goals of care.   Procedures:    Consultants:   Neurology Palliative medicine  Antimicrobials:   Anti-infectives (From admission, onward)    Start     Dose/Rate Route Frequency Ordered Stop   07/27/22 1900  cefTRIAXone (ROCEPHIN) 2 g in sodium chloride 0.9 % 100 mL IVPB        2 g 200 mL/hr over 30 Minutes Intravenous Every 24 hours 07/27/22 1029     07/26/22 1915  fluconazole (DIFLUCAN) tablet 200 mg  Status:  Discontinued        200 mg Oral Daily 07/26/22 1825 07/27/22 1029   07/26/22 1900  cefTRIAXone (ROCEPHIN) 1 g in sodium chloride 0.9 % 100 mL IVPB  Status:  Discontinued        1 g 200 mL/hr over 30 Minutes Intravenous Every 24 hours 07/26/22 1807 07/27/22 1029   07/26/22 1900  azithromycin (ZITHROMAX) 500 mg in sodium chloride 0.9 % 250 mL IVPB        500 mg 250 mL/hr over 60 Minutes Intravenous Every 24 hours 07/26/22 1807     07/26/22 0945  cefTRIAXone (ROCEPHIN) 1 g in sodium chloride 0.9 % 100 mL IVPB        1 g 200 mL/hr over 30 Minutes Intravenous  Once 07/26/22 0942 07/26/22 1023   07/26/22 0945  azithromycin (ZITHROMAX) 500 mg in sodium chloride 0.9 % 250 mL IVPB        500 mg 250 mL/hr over 60 Minutes Intravenous  Once 07/26/22 0942 07/26/22 1934          Medications  clopidogrel  75 mg Per Tube Daily   cyanocobalamin  1,000 mcg Intramuscular Q1200   feeding supplement (PROSource  TF20)  60 mL Per Tube Daily   folic acid  1 mg Intravenous Daily   free water  100 mL Per Tube Q4H   heparin  5,000 Units Subcutaneous Q8H   insulin aspart  0-5 Units Subcutaneous QHS   insulin aspart  0-9 Units Subcutaneous TID WC   leptospermum manuka honey  1 Application Topical Daily   levothyroxine  137 mcg Per Tube Q0600   simvastatin  40 mg Per Tube q1800   sodium chloride flush  3 mL Intravenous Q12H   sodium chloride flush  3 mL Intravenous Q12H      Subjective:   Kim Mills was seen and  examined today.  Minimally responsive, moaning, does not respond to any verbal commands, withdraws to painful stimuli.  Son at the bedside.  Receiving tube feeds via core track. Objective:   Vitals:   08/01/22 0536 08/01/22 0759 08/01/22 0759 08/01/22 1151  BP: (!) 136/55  (!) 134/49 (!) 158/72  Pulse: 88  92 86  Resp: (!) 24  20 20   Temp: 99.2 F (37.3 C) 99.1 F (37.3 C)    TempSrc: Oral Axillary    SpO2: 97%  98% 100%  Weight:      Height:        Intake/Output Summary (Last 24 hours) at 08/01/2022 1244 Last data filed at 08/01/2022 0500 Gross per 24 hour  Intake --  Output 860 ml  Net -860 ml     Wt Readings from Last 3 Encounters:  07/31/22 79.7 kg  04/22/22 79.4 kg  03/01/22 75.3 kg   Physical Exam General: Eyes closed, not following any verbal commands.+ Core track Cardiovascular: S1 S2 clear, RRR.  Respiratory: Scattered rhonchi bilaterally Gastrointestinal: Soft, nontender, nondistended, NBS Ext: no pedal edema bilaterally Neuro: not follow commands Psych: dementia, does not respond to any commands.    Data Reviewed:  I have personally reviewed following labs    CBC Lab Results  Component Value Date   WBC 6.8 07/30/2022   RBC 2.70 (L) 07/30/2022   HGB 7.9 (L) 07/30/2022   HCT 24.3 (L) 07/30/2022   MCV 90.0 07/30/2022   MCH 29.3 07/30/2022   PLT 220 07/30/2022   MCHC 32.5 07/30/2022   RDW 16.5 (H) 07/30/2022   LYMPHSABS 1.5 07/26/2022    MONOABS 0.4 07/26/2022   EOSABS 0.1 07/26/2022   BASOSABS 0.0 07/26/2022     Last metabolic panel Lab Results  Component Value Date   NA 138 07/31/2022   K 4.0 07/31/2022   CL 111 07/31/2022   CO2 19 (L) 07/31/2022   BUN 16 07/31/2022   CREATININE 0.93 07/31/2022   GLUCOSE 248 (H) 07/31/2022   GFRNONAA >60 07/31/2022   GFRAA >60 11/12/2019   CALCIUM 8.3 (L) 07/31/2022   PHOS 3.1 07/31/2022   PROT 5.5 (L) 07/28/2022   ALBUMIN 1.9 (L) 07/28/2022   LABGLOB 2.9 07/12/2022   AGRATIO 0.9 07/12/2022   BILITOT 0.3 07/28/2022   ALKPHOS 66 07/28/2022   AST 19 07/28/2022   ALT 25 07/28/2022   ANIONGAP 8 07/31/2022    CBG (last 3)  Recent Labs    08/01/22 0540 08/01/22 0718 08/01/22 1146  GLUCAP 266* 313* 272*      Coagulation Profile: Recent Labs  Lab 07/26/22 0805  INR 1.2     Radiology Studies: I have personally reviewed the imaging studies  No results found.     Thad Ranger M.D. [facility] 08/01/2022, 12:44 PM  Available via Epic secure chat 7am-7pm After 7 pm, please refer to night coverage provider listed on amion.

## 2022-08-01 NOTE — Progress Notes (Signed)
   07/31/22 1122  Assess: MEWS Score  Temp 98.6 F (37 C)  BP (!) 161/78  MAP (mmHg) 99  Pulse Rate (!) 167  Resp 20  Level of Consciousness Responds to Pain  SpO2 100 %  O2 Device Room Air  Assess: MEWS Score  MEWS Temp 0  MEWS Systolic 0  MEWS Pulse 3  MEWS RR 0  MEWS LOC 2  MEWS Score 5  MEWS Score Color Red  Assess: if the MEWS score is Yellow or Red  Were vital signs taken at a resting state? Yes  Focused Assessment Change from prior assessment (see assessment flowsheet)  Does the patient meet 2 or more of the SIRS criteria? Yes  Does the patient have a confirmed or suspected source of infection? Yes  MEWS guidelines implemented  Yes, red  Treat  MEWS Interventions Considered administering scheduled or prn medications/treatments as ordered  Take Vital Signs  Increase Vital Sign Frequency  Red: Q1hr x2, continue Q4hrs until patient remains green for 12hrs  Escalate  MEWS: Escalate Red: Discuss with charge nurse and notify provider. Consider notifying RRT. If remains red for 2 hours consider need for higher level of care  Notify: Charge Nurse/RN  Name of Charge Nurse/RN Notified Angela, RN  Provider Notification  Provider Name/Title Dr. Isidoro Donning  Date Provider Notified 07/31/22  Time Provider Notified 1130  Method of Notification Page  Notification Reason Change in status  Provider response See new orders  Date of Provider Response 07/31/22  Time of Provider Response 1130  Assess: SIRS CRITERIA  SIRS Temperature  0  SIRS Pulse 1  SIRS Respirations  0  SIRS WBC 0  SIRS Score Sum  1

## 2022-08-02 ENCOUNTER — Ambulatory Visit: Payer: Medicare Other | Admitting: Internal Medicine

## 2022-08-02 DIAGNOSIS — C9000 Multiple myeloma not having achieved remission: Secondary | ICD-10-CM | POA: Diagnosis not present

## 2022-08-02 DIAGNOSIS — G9341 Metabolic encephalopathy: Secondary | ICD-10-CM | POA: Diagnosis not present

## 2022-08-02 DIAGNOSIS — Z515 Encounter for palliative care: Secondary | ICD-10-CM | POA: Diagnosis not present

## 2022-08-02 DIAGNOSIS — R52 Pain, unspecified: Secondary | ICD-10-CM

## 2022-08-02 DIAGNOSIS — E162 Hypoglycemia, unspecified: Secondary | ICD-10-CM | POA: Diagnosis not present

## 2022-08-02 DIAGNOSIS — R569 Unspecified convulsions: Secondary | ICD-10-CM | POA: Diagnosis not present

## 2022-08-02 DIAGNOSIS — J189 Pneumonia, unspecified organism: Secondary | ICD-10-CM | POA: Diagnosis not present

## 2022-08-02 LAB — GLUCOSE, CAPILLARY
Glucose-Capillary: 259 mg/dL — ABNORMAL HIGH (ref 70–99)
Glucose-Capillary: 284 mg/dL — ABNORMAL HIGH (ref 70–99)
Glucose-Capillary: 293 mg/dL — ABNORMAL HIGH (ref 70–99)

## 2022-08-02 MED ORDER — MORPHINE SULFATE (CONCENTRATE) 10 MG/0.5ML PO SOLN
5.0000 mg | ORAL | Status: DC | PRN
  Administered 2022-08-03: 5 mg via ORAL
  Filled 2022-08-02: qty 0.5

## 2022-08-02 MED ORDER — LORAZEPAM 1 MG PO TABS: 1.0000 mg | ORAL_TABLET | Freq: Four times a day (QID) | ORAL | Status: DC | PRN

## 2022-08-02 NOTE — TOC Initial Note (Signed)
Transition of Care [facility]) - Initial/Assessment Note    Patient Details  Name: Kim Mills MRN: [id number] Date of Birth: 01/09/1941  [facility] of [facility] CM/SW Contact:    Kermit Balo, RN Phone Number: 08/02/2022, 12:07 PM  Clinical Narrative:                 CM received referral from palliative care for home with hospice services. Palliative care states family wants [facility]. CM has made the referral to [facility] with [facility]. Per pts daughter, [name] they have all needed DME at home.  Pt will need ambulance transport at d/c. CM will speak with [name] in am to arrange timing for transport.  TOC following.  Expected Discharge Plan: Home w Hospice Care Barriers to Discharge: Continued Medical Work up   Patient Goals and CMS Choice   CMS Medicare.gov Compare Post Acute Care list provided to:: Patient Represenative (must comment) Choice offered to / list presented to : Adult Children      Expected Discharge Plan and Services   Discharge Planning Services: CM Consult Post Acute Care Choice: Hospice Living arrangements for the past 2 months: Single Family Home                             HH Agency: [facility] Date [facility] Agency Contacted: 08/02/22   Representative spoke with at [facility] Agency: Kiko  Prior Living Arrangements/Services Living arrangements for the past 2 months: Single Family Home Lives with:: Adult Children Patient language and need for interpreter reviewed:: Yes          Care giver support system in place?: Yes (comment) Current home services: DME (All needed per daughter: [name]) Criminal Activity/Legal Involvement Pertinent to Current Situation/Hospitalization: No - Comment as needed  Activities of Daily Living Home Assistive Devices/Equipment: Bedside commode/3-in-1, Environmental consultant (specify type), Wheelchair, CBG Meter ADL Screening (condition at time of admission) Patient's cognitive ability adequate to safely complete  daily activities?: No Is the patient deaf or have difficulty hearing?: Yes Does the patient have difficulty seeing, even when wearing glasses/contacts?: Yes Does the patient have difficulty concentrating, remembering, or making decisions?: Yes Patient able to express need for assistance with ADLs?: No Does the patient have difficulty dressing or bathing?: No Independently performs ADLs?: No Communication: Dependent Is this a change from baseline?: Pre-admission baseline Dressing (OT): Needs assistance Is this a change from baseline?: Pre-admission baseline Grooming: Dependent Is this a change from baseline?: Pre-admission baseline Feeding: Needs assistance Is this a change from baseline?: Pre-admission baseline Bathing: Dependent Is this a change from baseline?: Pre-admission baseline Toileting: Dependent Is this a change from baseline?: Pre-admission baseline In/Out Bed: Dependent Is this a change from baseline?: Pre-admission baseline Walks in Home: Dependent Is this a change from baseline?: Pre-admission baseline Does the patient have difficulty walking or climbing stairs?: Yes Weakness of Legs: Both Weakness of Arms/Hands: Both  Permission Sought/Granted                  Emotional Assessment Appearance:: Appears stated age         Psych Involvement: No (comment)  Admission diagnosis:  Acute metabolic encephalopathy [G93.41] Patient Active Problem List   Diagnosis Date Noted   UTI (urinary tract infection) 07/28/2022   Pressure injury of skin 07/27/2022   Hypoglycemia 07/27/2022   CAP (community acquired pneumonia) 07/27/2022   Hyponatremia 07/27/2022   Malnutrition of moderate degree 07/27/2022  Acute metabolic encephalopathy 07/26/2022   Generalized weakness 09/24/2020   Overactive bladder 05/13/2020   Multiple myeloma (HCC) 10/13/2019   Goals of care, counseling/discussion 10/13/2019   Multiple myeloma not having achieved remission (HCC) 10/13/2019    Monoclonal gammopathy 09/12/2019   CKD stage 3a, GFR 45-59 ml/min (HCC) 04/18/2019   Vitamin D deficiency 04/17/2019   B12 deficiency 04/12/2018   Normocytic anemia 04/12/2018   Carotid artery disease without cerebral infarction (HCC) 04/12/2018   Gait disorder 01/11/2018   Diabetic neuropathy (HCC) 01/10/2018   CAD (coronary artery disease) 11/23/2017   SVT (supraventricular tachycardia) 10/05/2017   History of colonic polyps    GERD (gastroesophageal reflux disease) 03/13/2014   Hyperlipidemia    Type 2 diabetes mellitus with diabetic neuropathy, unspecified (HCC)    OA (osteoarthritis) of knee    Hypothyroidism    Hypertension    PCP:  [name], MD Pharmacy:   [facility] - [place], [place] - [address] [place] [address] Phone: [phone] Fax: [phone]     Social Determinants of Health (SDOH) Social History: SDOH Screenings   Food Insecurity: No Food Insecurity (07/26/2022)  Housing: Medium Risk (07/26/2022)  Transportation Needs: No Transportation Needs (07/26/2022)  Utilities: Not At Risk (07/26/2022)  Alcohol Screen: Low Risk  (11/05/2020)  Depression (PHQ2-9): Medium Risk (03/29/2022)  Financial Resource Strain: Low Risk  (11/05/2020)  Physical Activity: Sufficiently Active (11/05/2020)  Social Connections: Moderately Integrated (11/05/2020)  Stress: No Stress Concern Present (11/05/2020)  Tobacco Use: Medium Risk (07/26/2022)   SDOH Interventions:     Readmission Risk Interventions     No data to display

## 2022-08-02 NOTE — Progress Notes (Signed)
[facility]                                                                              Kim Mills, is a 82 y.o. female, DOB - 1941-01-08, ZOX:[id number] Admit date - 07/26/2022    Outpatient Primary MD for the patient is Kim Halon, MD  LOS - 6  days  Chief Complaint  Patient presents with   Code Stroke       Brief summary   Patient is a 82 year old female with multiple myeloma on Revlimid, DM, obesity, HTN and hypothyroidism who presented with hypoglycemia and confusion.  The week prior, patient had UTI, treated with Macrobid, there was some noted somnolence with taking Macrobid suspected interaction with Revlimid.  Patient completed antibiotics, was back to baseline and then on the day prior to admission had diarrhea and on the morning of admission was unresponsive.  EMS found her with glucose of 30. Patient was admitted for further workup. Assessment & Plan    Principal Problem:   Acute metabolic encephalopathy superimposed on dementia -RPR, TSH, B12 low normal, folate low, B1 pending -UA positive for UTI, UCx + E. coli  -CXR showed ill-defined opacities in the left lung base and mid right lung, reflecting atelectasis or pneumonia -Continue folic acid, B12 368, placed on vitamin B12 IM daily, -MRI brain showed punctate foci of acute ischemia in both cerebellar hemispheres.  No hemorrhage or mass effect.  Multiple old cerebellar infarcts and chronic small vessel ischemia -EEG negative -Per family, at baseline she has been declining in the last 1 month, sleeping more and eating less in the last 2 weeks. -Palliative medicine consulted for GOC-> DNR/DNI -No meaningful recovery since admission, has cortrack with tube feeding, received antibiotics with no change in the clinical status.  Discussed with the daughter at the bedside today, patient family is now leaning towards a comfort care.  Notified palliative medicine to discuss with family, they are leaning  towards home with hospice   Active Problems: Acute CVA both cerebellar hemispheres -MRI brain showed punctate foci of acute ischemia in both cerebellar hemispheres.  No hemorrhage or mass effect.  Multiple old cerebellar infarcts and chronic small vessel ischemia -Neurology following, recommended aspirin 81 mg daily -CTA head and neck showed mild intracranial atherosclerosis without large vessel occlusion or significant proximal stenosis, 50% right ICA origin stenosis -2D echo showed EF of 60 to 65%, G1 DD -LDL 47, hemoglobin A1c 6.4 -Currently receiving tube feeds via core track -No significant improvement in the clinical status, awaiting palliative medicine GOC.   E. coli UTI (urinary tract infection), -Continue IV Rocephin    Hypoglycemia, dysphagia -Per family, she has been eating much less over the last several weeks and sleeping a lot.   - Core track+, currently on tube feeds    CAP (community acquired pneumonia) - Continue Rocephin and azithromycin, likely has aspiration  Hyponatremia -Improved  Hypomagnesemia -Improved   Multiple myeloma not having achieved remission (HCC) Follows with Dr. Ellin Saba, on Revlimid -Palliative medicine consult   CKD stage 3a, GFR 45-59 ml/min (HCC) Creatinine stable within baseline range   Normocytic anemia Due to  multiple myeloma, hb 11-12 a few months ago. Transfuse for hemoglobin less than 7  Hyperlipidemia - Hold simvastatin until able to take p.o.   Hypertension BP currently stable - Hold losartan, metoprolol   Hypothyroidism TSH normal - Continue levothyroxine   Type 2 diabetes mellitus with diabetic neuropathy, unspecified (HCC) -Presented with hypoglycemia -Hemoglobin A1c 6.4 -Continue sliding scale insulin sensitive while inpatient     Pressure injury of skin Stage III, sacrum, present on admission - WOC nurse consult   Moderate protein calorie malnutrition, hypoalbuminemia < 1.9 Nutrition Problem: Moderate  Malnutrition Etiology: chronic illness, multiple myeloma, Signs/Symptoms: mild muscle depletion, mild fat depletion Estimated body mass index is 29.48 kg/m as calculated from the following:   Height as of this encounter: 5\' 4"  (1.626 m).   Weight as of this encounter: 77.9 kg.  Code Status: Full code DVT Prophylaxis:  Place and maintain sequential compression device Start: 07/28/22 0846 heparin injection 5,000 Units Start: 07/26/22 1745 SCDs Start: 07/26/22 1654 Place TED hose Start: 07/26/22 1654   Level of Care: Level of care: Telemetry Medical Family Communication: Updated patient's daughter Kim Mills at the bedside  Disposition Plan:      Remains inpatient appropriate: No significant improvement in the clinical status since admission despite aggressive measures including core track with tube feeding, IV antibiotics.   Procedures:    Consultants:   Neurology Palliative medicine  Antimicrobials:   Anti-infectives (From admission, onward)    Start     Dose/Rate Route Frequency Ordered Stop   07/27/22 1900  cefTRIAXone (ROCEPHIN) 2 g in sodium chloride 0.9 % 100 mL IVPB        2 g 200 mL/hr over 30 Minutes Intravenous Every 24 hours 07/27/22 1029     07/26/22 1915  fluconazole (DIFLUCAN) tablet 200 mg  Status:  Discontinued        200 mg Oral Daily 07/26/22 1825 07/27/22 1029   07/26/22 1900  cefTRIAXone (ROCEPHIN) 1 g in sodium chloride 0.9 % 100 mL IVPB  Status:  Discontinued        1 g 200 mL/hr over 30 Minutes Intravenous Every 24 hours 07/26/22 1807 07/27/22 1029   07/26/22 1900  azithromycin (ZITHROMAX) 500 mg in sodium chloride 0.9 % 250 mL IVPB        500 mg 250 mL/hr over 60 Minutes Intravenous Every 24 hours 07/26/22 1807     07/26/22 0945  cefTRIAXone (ROCEPHIN) 1 g in sodium chloride 0.9 % 100 mL IVPB        1 g 200 mL/hr over 30 Minutes Intravenous  Once 07/26/22 0942 07/26/22 1023   07/26/22 0945  azithromycin (ZITHROMAX) 500 mg in sodium chloride 0.9 % 250 mL  IVPB        500 mg 250 mL/hr over 60 Minutes Intravenous  Once 07/26/22 0942 07/26/22 1934          Medications  clopidogrel  75 mg Per Tube Daily   cyanocobalamin  1,000 mcg Intramuscular Q1200   feeding supplement (PROSource TF20)  60 mL Per Tube Daily   folic acid  1 mg Intravenous Daily   free water  100 mL Per Tube Q4H   heparin  5,000 Units Subcutaneous Q8H   insulin aspart  0-5 Units Subcutaneous QHS   insulin aspart  0-9 Units Subcutaneous TID WC   leptospermum manuka honey  1 Application Topical Daily   levothyroxine  137 mcg Per Tube Q0600   simvastatin  40 mg Per Tube q1800  sodium chloride flush  3 mL Intravenous Q12H   sodium chloride flush  3 mL Intravenous Q12H      Subjective:   Kim Mills was seen and examined today.  Daughter at the bedside, patient is minimally responsive not responding to any verbal commands.  No fevers or chills, no nausea or vomiting.  Overnight no acute issues.   Objective:   Vitals:   08/02/22 0306 08/02/22 0500 08/02/22 0812 08/02/22 1140  BP: (!) 113/98  (!) 145/105 124/65  Pulse: 87  82 84  Resp: 18  16 16   Temp: 98.7 F (37.1 C)  98.8 F (37.1 C) 99.6 F (37.6 C)  TempSrc: Oral  Oral Oral  SpO2: 99%  100% 100%  Weight:  77.9 kg    Height:        Intake/Output Summary (Last 24 hours) at 08/02/2022 1144 Last data filed at 08/01/2022 1727 Gross per 24 hour  Intake --  Output 1500 ml  Net -1500 ml     Wt Readings from Last 3 Encounters:  08/02/22 77.9 kg  04/22/22 79.4 kg  03/01/22 75.3 kg   Physical Exam General: Not following any verbal commands, frail and ill-appearing, cortrack+ Cardiovascular: S1 S2 clear, RRR.  Respiratory: CTAB anteriorly Gastrointestinal: Soft, NT, ND, NBS  Ext: no pedal edema bilaterally Neuro: withdraws to pain stimuli, not responding to verbal commands Psych: dementia, not responding to any commands    Data Reviewed:  I have personally reviewed following labs     CBC Lab Results  Component Value Date   WBC 6.8 07/30/2022   RBC 2.70 (L) 07/30/2022   HGB 7.9 (L) 07/30/2022   HCT 24.3 (L) 07/30/2022   MCV 90.0 07/30/2022   MCH 29.3 07/30/2022   PLT 220 07/30/2022   MCHC 32.5 07/30/2022   RDW 16.5 (H) 07/30/2022   LYMPHSABS 1.5 07/26/2022   MONOABS 0.4 07/26/2022   EOSABS 0.1 07/26/2022   BASOSABS 0.0 07/26/2022     Last metabolic panel Lab Results  Component Value Date   NA 138 07/31/2022   K 4.0 07/31/2022   CL 111 07/31/2022   CO2 19 (L) 07/31/2022   BUN 16 07/31/2022   CREATININE 0.93 07/31/2022   GLUCOSE 248 (H) 07/31/2022   GFRNONAA >60 07/31/2022   GFRAA >60 11/12/2019   CALCIUM 8.3 (L) 07/31/2022   PHOS 3.1 07/31/2022   PROT 5.5 (L) 07/28/2022   ALBUMIN 1.9 (L) 07/28/2022   LABGLOB 2.9 07/12/2022   AGRATIO 0.9 07/12/2022   BILITOT 0.3 07/28/2022   ALKPHOS 66 07/28/2022   AST 19 07/28/2022   ALT 25 07/28/2022   ANIONGAP 8 07/31/2022    CBG (last 3)  Recent Labs    08/01/22 2308 08/02/22 0308 08/02/22 0816  GLUCAP 238* 259* 284*      Coagulation Profile: No results for input(s): "INR", "PROTIME" in the last 168 hours.    Radiology Studies: I have personally reviewed the imaging studies  No results found.     Thad Ranger M.D. [facility] 08/02/2022, 11:44 AM  Available via Epic secure chat 7am-7pm After 7 pm, please refer to night coverage provider listed on amion.

## 2022-08-02 NOTE — Consult Note (Signed)
   [facility] Inpatient Consult   08/02/2022  Kim Mills [birth year] [id number]  [facility] [[facility]]  Accountable Care Organization [ACO] Patient: Medicare ACO REACH  Primary Care Provider:  [name], MD with [facility]    Patient screened for hospitalization with noted rising risk score for unplanned readmission risk 7 day length of stay and  to assess for potential [facility]  [[facility]] Care Management service needs for post hospital transition for care coordination.  Review of patient's electronic medical record reveals patient is transitioning to comfort measures and family at bedside on unit rounds.  Plan: Currently, patient for comfort measures.   Of note, [facility] Care Management/Population Health does not replace or interfere with any arrangements made by the Inpatient Transition of Care team.  For questions contact:   Charlesetta Shanks, RN BSN CCM [facility]  [phone] business mobile phone Toll free office [phone]  *Concierge Line  [phone] Fax number: [phone] [email or web address]

## 2022-08-02 NOTE — Progress Notes (Signed)
Patient ID: Kim Mills, female   DOB: 1940-09-17, 82 y.o.   MRN: [id number]    Progress Note from the Palliative Medicine Team at [facility]   Patient Name: Kim Mills        Date: 08/02/2022 DOB: May 24, 1940  Age: 82 y.o. MRN#: [id number] Attending Physician: Cathren Harsh, MD Primary Care Physician: [name], MD Admit Date: 07/26/2022    Extensive chart review has been completed prior to meeting with patient/family  including labs, vital signs, imaging, progress/consult notes, orders, medications and available advance directive documents.   82 y.o. female  with past medical history of MM on Revlimid, DM, obesity, HTN and hypothyroidism  admitted on 07/26/2022 with hypoglycemia and confusion. UA and CXR consistent with infection, antibiotics started. CT revealed cerebellar infarcts; per neurology, likely incidental finding.  EEG negative. Patient with ongoing poor MS and poor PO intake. PMT consulted to discuss GOC.   Initial PMT consult 07/28/2022   Today is day 7 of this hospitalization.  No change in clinical status within the context of full medical support.  She continues to fail to thrive.  Today patient is nonverbal, unable to follow commands and continues with poor p.o. intake  This NP assessed patient at the bedside as a follow up for palliative medicine needs and emotional support and to meet with family as scheduled for continued conversation regarding treatment option decisions, advance directive decisions and anticipatory care needs.  I met with family today to include her sister Spero Curb at bedside , on the phone was daughters Iris/ Lisa/Renee, Corporate treasurer.  Unfortunately her Sister Denzil Magnuson Redd herself was hospitalized yesterday.  Iris is one of the main caregivers in the home.   Education offered on patient's current medical situation.  Family verbalized an understanding of the seriousness and the associated limited prognosis.  As a  family they have made decision to shift to a comfort path.  Education offered on the difference between a full medical support path and a palliative comfort path.  Education offered on hospice benefit; loss of for ineligibility  Education offered on the natural trajectory and expectations at end-of-life.  Plan of care: -DNR/DNI -No artificial feeding or hydration now or in the future-  DC core Trak today in anticipation for transition home tomorrow/comfort feeds as tolerated/excellent  mouth care -No further IV antibiotics's, no further diagnostics or life-prolonging measures -Symptom management to enhance comfort       -Roxanol 5 mg p.o./sublingual every 1 hour as needed/pain or  dyspnea       -Ativan 1 mg p.o./sublingual every 6 hours as needed/anxiety -Avoid rehospitalization -Transition home with hospice benefit in place.  Family is requesting [facility].  Family anticipates discharge tomorrow  Education offered today regarding  the importance of continued conversation with family and the medical providers regarding overall plan of care and treatment options,  ensuring decisions are within the context of the patients values and GOCs.  Questions and concerns addressed   Discussed with Dr  Isidoro Donning and transition of care team  Time: 21 minutes   Lorinda Creed NP  Palliative Medicine Team Team Phone # [phone] Pager [phone]

## 2022-08-02 NOTE — Progress Notes (Signed)
PT Cancellation Note  Patient Details Name: Kim Mills MRN: [id number] DOB: 1940/12/17   Cancelled Treatment:     Pt transitioning to comfort based care, therapy to sign off with plan for to dc home with hospice services.   Wynn Maudlin, DPT Acute Rehabilitation Services Office [phone]  08/02/22 3:55 PM

## 2022-08-03 ENCOUNTER — Telehealth: Payer: Self-pay | Admitting: Internal Medicine

## 2022-08-03 ENCOUNTER — Encounter (HOSPITAL_COMMUNITY): Payer: Self-pay | Admitting: Hematology

## 2022-08-03 ENCOUNTER — Other Ambulatory Visit (HOSPITAL_COMMUNITY): Payer: Self-pay

## 2022-08-03 DIAGNOSIS — Z515 Encounter for palliative care: Secondary | ICD-10-CM | POA: Diagnosis not present

## 2022-08-03 DIAGNOSIS — J189 Pneumonia, unspecified organism: Secondary | ICD-10-CM | POA: Diagnosis not present

## 2022-08-03 DIAGNOSIS — E162 Hypoglycemia, unspecified: Secondary | ICD-10-CM | POA: Diagnosis not present

## 2022-08-03 DIAGNOSIS — G9341 Metabolic encephalopathy: Secondary | ICD-10-CM | POA: Diagnosis not present

## 2022-08-03 DIAGNOSIS — Z66 Do not resuscitate: Secondary | ICD-10-CM | POA: Diagnosis not present

## 2022-08-03 DIAGNOSIS — R569 Unspecified convulsions: Secondary | ICD-10-CM | POA: Diagnosis not present

## 2022-08-03 LAB — VITAMIN B1
Vitamin B1 (Thiamine): 58 nmol/L — ABNORMAL LOW (ref 66.5–200.0)
Vitamin B1 (Thiamine): 43.6 nmol/L — ABNORMAL LOW (ref 66.5–200.0)

## 2022-08-03 MED ORDER — GERHARDT'S BUTT CREAM
1.0000 | TOPICAL_CREAM | Freq: Two times a day (BID) | CUTANEOUS | 2 refills | Status: DC | PRN
  Filled 2022-08-03: qty 1, 1d supply, fill #0

## 2022-08-03 MED ORDER — LORAZEPAM 1 MG PO TABS
1.0000 mg | ORAL_TABLET | Freq: Four times a day (QID) | ORAL | 0 refills | Status: DC | PRN
  Filled 2022-08-03: qty 30, 10d supply, fill #0

## 2022-08-03 MED ORDER — BISACODYL 10 MG RE SUPP
10.0000 mg | Freq: Every day | RECTAL | 1 refills | Status: DC | PRN
  Filled 2022-08-03: qty 12, 12d supply, fill #0

## 2022-08-03 MED ORDER — MEDIHONEY WOUND/BURN DRESSING EX PSTE: 1.0000 | PASTE | Freq: Every day | CUTANEOUS | 2 refills | Status: DC

## 2022-08-03 MED ORDER — LORAZEPAM 2 MG/ML IJ SOLN
1.0000 mg | Freq: Four times a day (QID) | INTRAMUSCULAR | Status: DC | PRN
  Administered 2022-08-03: 1 mg via INTRAVENOUS
  Filled 2022-08-03: qty 1

## 2022-08-03 MED ORDER — MORPHINE SULFATE (CONCENTRATE) 10 MG/0.5ML PO SOLN
5.0000 mg | ORAL | 0 refills | Status: DC | PRN
  Filled 2022-08-03: qty 60, 30d supply, fill #0

## 2022-08-03 NOTE — Telephone Encounter (Signed)
Samantha called from [facility] need to know if Dr Allena Katz will be attending for patient while on hospice. Call back # [phone]

## 2022-08-03 NOTE — Progress Notes (Signed)
Nutrition Brief Note  Chart reviewed. Pt now transitioning to comfort care.  No further nutrition interventions planned at this time.  Please re-consult as needed.   Atlas Crossland, RD, LDN Clinical Dietitian RD pager # available in AMION  After hours/weekend pager # available in AMION   

## 2022-08-03 NOTE — Telephone Encounter (Signed)
Spoke to [employer]

## 2022-08-03 NOTE — Discharge Summary (Signed)
Physician Discharge Summary   Patient: Kim Mills MRN: [id number] DOB: 01-Aug-1940  Admit date:     07/26/2022  Discharge date: 08/03/22  Discharge Physician: Thad Ranger, MD   PCP: [name], MD   Recommendations at discharge:   Comfort care, DNR/DNI, hospice  Discharge Diagnoses:    Acute metabolic encephalopathy   Hypoglycemia   CAP (community acquired pneumonia)   Type 2 diabetes mellitus with diabetic neuropathy, unspecified (HCC)   Hypothyroidism   Hypertension   Hyperlipidemia   Normocytic anemia   CKD stage 3a, GFR 45-59 ml/min (HCC)   Multiple myeloma not having achieved remission (HCC)   Pressure injury of skin   Hyponatremia   Malnutrition of moderate degree   UTI (urinary tract infection)   Hospital Course:  Patient is a 82 year old female with multiple myeloma on Revlimid, DM, obesity, HTN and hypothyroidism who presented with hypoglycemia and confusion.  The week prior, patient had UTI, treated with Macrobid, there was some noted somnolence with taking Macrobid suspected interaction with Revlimid.  Patient completed antibiotics, was back to baseline and then on the day prior to admission had diarrhea and on the morning of admission was unresponsive.  EMS found her with glucose of 30. Patient was admitted for further workup.  Assessment and Plan:  Acute metabolic encephalopathy superimposed on dementia -RPR, TSH, B12 low normal, folate low, B1 level low 43.6 -UA positive for UTI, UCx + E. coli  -CXR showed ill-defined opacities in the left lung base and mid right lung, reflecting atelectasis or pneumonia -Patient was placed on B12 supplementation and folic acid. B12 368 -MRI brain showed punctate foci of acute ischemia in both cerebellar hemispheres.  No hemorrhage or mass effect.  Multiple old cerebellar infarcts and chronic small vessel ischemia -EEG negative -Per family, at baseline she has been declining in the last 1 month, sleeping more and  eating less in the last 2 weeks.  Core track was placed and patient was placed on tube feeding -Palliative medicine was consulted -No meaningful recovery since admission, despite antibiotics, tube feeding with no change in the clinical status. Multiple discussions with the patient's family by myself and palliative medicine and family transition to comfort care.  Requested discharge home with hospice      Active Problems: Acute CVA both cerebellar hemispheres -MRI brain showed punctate foci of acute ischemia in both cerebellar hemispheres.  No hemorrhage or mass effect.  Multiple old cerebellar infarcts and chronic small vessel ischemia -Neurology following, recommended aspirin 81 mg daily -CTA head and neck showed mild intracranial atherosclerosis without large vessel occlusion or significant proximal stenosis, 50% right ICA origin stenosis -2D echo showed EF of 60 to 65%, G1 DD -LDL 47, hemoglobin A1c 6.4 -Due to acute encephalopathy and progressive decline, core track was placed and patient received tube feeding.  -Currently on comfort care status   E. coli UTI (urinary tract infection), -Patient received IV-Rocephin    Hypoglycemia, dysphagia -Per family, she had been eating much less over the last several weeks and sleeping a lot.   - Core track removed on 6/24, continue comfort feeds     CAP (community acquired pneumonia) - Patient was placed on IV Rocephin and Zithromax   Hyponatremia -Improved   Hypomagnesemia -Improved   Multiple myeloma not having achieved remission (HCC) Follows with Dr. Ellin Saba, on Revlimid -Palliative medicine was consulted,   CKD stage 3a, GFR 45-59 ml/min (HCC) Creatinine stable within baseline range   Normocytic anemia Hemoglobin 7.9 on  6/21   Hyperlipidemia, hypertension - Oral meds held, comfort care  Hypothyroidism TSH normal    Type 2 diabetes mellitus with diabetic neuropathy, unspecified (HCC) -Presented with  hypoglycemia -Hemoglobin A1c 6.4 -Comfort care    Pressure injury of skin Stage III, sacrum, present on admission - WOC nurse consult   Moderate protein calorie malnutrition, hypoalbuminemia <       1.9 Nutrition Problem: Moderate Malnutrition Etiology: chronic illness, multiple myeloma, Signs/Symptoms: mild muscle depletion, mild fat depletion Estimated body mass index is 29.48 kg/m as calculated from the following:   Height as of this encounter: 5\' 4"  (1.626 m).   Weight as of this encounter: 77.9 kg.     Pain control - [employer] Controlled Substance Reporting System database was reviewed. and patient was instructed, not to drive, operate heavy machinery, perform activities at heights, swimming or participation in water activities or provide baby-sitting services while on Pain, Sleep and Anxiety Medications; until their outpatient Physician has advised to do so again. Also recommended to not to take more than prescribed Pain, Sleep and Anxiety Medications.  Consultants: Palliative medicine Procedures performed:   Disposition: Home Diet recommendation: Comfort foods  DISCHARGE MEDICATION: Allergies as of 08/03/2022   No Known Allergies      Medication List     STOP taking these medications    albuterol 108 (90 Base) MCG/ACT inhaler Commonly known as: VENTOLIN HFA   ASPIRIN 81 PO   blood glucose meter kit and supplies   Blood Glucose System Pak Kit   BLOOD GLUCOSE TEST STRIPS Strp   Calcium 500 MG tablet   clotrimazole 10 MG troche Commonly known as: MYCELEX   dexamethasone 4 MG tablet Commonly known as: DECADRON   fluconazole 100 MG tablet Commonly known as: DIFLUCAN   fluticasone 50 MCG/ACT nasal spray Commonly known as: FLONASE   gabapentin 300 MG capsule Commonly known as: NEURONTIN   glipiZIDE 5 MG tablet Commonly known as: GLUCOTROL   guaiFENesin 600 MG 12 hr tablet Commonly known as: MUCINEX   HM Loratadine 10 MG tablet Generic drug:  loratadine   HYDROcodone-acetaminophen 5-325 MG tablet Commonly known as: NORCO/VICODIN   Lancets Misc   lenalidomide 10 MG capsule Commonly known as: REVLIMID   levothyroxine 137 MCG tablet Commonly known as: Synthroid   losartan 25 MG tablet Commonly known as: COZAAR   magnesium oxide 400 (240 Mg) MG tablet Commonly known as: MAG-OX   meclizine 25 MG tablet Commonly known as: ANTIVERT   megestrol 400 MG/10ML suspension Commonly known as: MEGACE   metFORMIN 1000 MG tablet Commonly known as: GLUCOPHAGE   metoprolol tartrate 25 MG tablet Commonly known as: LOPRESSOR   nitrofurantoin (macrocrystal-monohydrate) 100 MG capsule Commonly known as: Macrobid   nystatin 100000 UNIT/ML suspension Commonly known as: MYCOSTATIN   omeprazole 40 MG capsule Commonly known as: PRILOSEC   polyethylene glycol 17 g packet Commonly known as: MIRALAX / GLYCOLAX   potassium chloride 10 MEQ tablet Commonly known as: KLOR-CON M   simvastatin 40 MG tablet Commonly known as: ZOCOR   Synthroid 137 MCG tablet Generic drug: levothyroxine   traMADol 50 MG tablet Commonly known as: ULTRAM   zinc oxide 20 % ointment Commonly known as: [employer] Zinc Oxide       TAKE these medications    bisacodyl 10 MG suppository Commonly known as: DULCOLAX Place 1 suppository (10 mg total) rectally daily as needed for moderate constipation.   Gerhardt's butt cream Crea Apply 1 Application topically 2 (two)  times daily as needed for irritation.   leptospermum manuka honey Pste paste Apply 1 Application topically daily. Apply to healing Unstageable pressure injury to sacrum once daily after cleansing Apply thin layer (3 mm) to wound.   LORazepam 1 MG tablet Commonly known as: ATIVAN Take 1 tablet (1 mg total) by mouth every 6 (six) hours as needed for anxiety.   morphine CONCENTRATE 10 mg / 0.5 ml concentrated solution Take 0.25 mLs (5 mg total) by mouth every 3 (three) hours as needed for  moderate pain or shortness of breath.               Discharge Care Instructions  (From admission, onward)           Start     Ordered   08/03/22 0000  Discharge wound care:       Comments: Wound care  Daily      Comments: Wound care to healing Unstageable wound to coccyx:cleanse with NS, pat dry. Apply MediHoney to wound, top with dry gauze and cover with silicone foam. Turn side to side.   08/03/22 0947            Follow-up Information     [place] Follow up.   Why: The home hospice will contact you for the first home visit Contact information: [address] [place] [address] [phone]                Discharge Exam: Filed Weights   07/26/22 0753 07/31/22 0500 08/02/22 0500  Weight: 79.4 kg 79.7 kg 77.9 kg   S: Somnolent, daughter at the bedside, no fevers or chills, appears comfortable  BP (!) 151/103 (BP Location: Left Arm)   Pulse 95   Temp 98.9 F (37.2 C) (Axillary)   Resp 18   Ht 5\' 4"  (1.626 m)   Wt 77.9 kg   SpO2 95%   BMI 29.48 kg/m   Physical Exam General: Somnolent, not following any commands, appears comfortable, ill-appearing Cardiovascular: S1 S2 clear, RRR.  Respiratory: CTAB Gastrointestinal: Soft, nontender, nondistended, NBS Ext: no pedal edema bilaterally Psych: somnolent dementia   Condition at discharge: poor  The results of significant diagnostics from this hospitalization (including imaging, microbiology, ancillary and laboratory) are listed below for reference.   Imaging Studies: VAS US CAROTID (at [facility] and [facility] only)  Result Date: 07/29/2022 Carotid Arterial Duplex Study Patient Name:  Kim Mills  Date of Exam:   07/27/2022 Medical Rec #: [id number]         Accession #:    [id number] Date of Birth: 04-Jun-1940        Patient Gender: F Patient Age:   16 years Exam Location:  [facility] Procedure:      VAS US CAROTID Referring Phys: Dewitt Hoes DE LA TORRE  --------------------------------------------------------------------------------  Indications:       CVA. Risk Factors:      Hypertension, hyperlipidemia, Diabetes, coronary artery                    disease. Limitations        Today's exam was limited due to patient movement/vocalization                    during exam. Comparison Study:  CTA Neck also ordered and performed today - results pending. Performing Technologist: Jean Rosenthal RDMS, RVT  Examination Guidelines: A complete evaluation includes B-mode imaging, spectral Doppler, color Doppler, and power Doppler as needed  of all accessible portions of each vessel. Bilateral testing is considered an integral part of a complete examination. Limited examinations for reoccurring indications may be performed as noted.  Right Carotid Findings: +----------+--------+--------+--------+------------------+--------+           PSV cm/sEDV cm/sStenosisPlaque DescriptionComments +----------+--------+--------+--------+------------------+--------+ CCA Prox  117     16                                         +----------+--------+--------+--------+------------------+--------+ CCA Distal109     15      <50%    calcific                   +----------+--------+--------+--------+------------------+--------+ ICA Prox  66      15      1-39%   heterogenous               +----------+--------+--------+--------+------------------+--------+ ICA Distal69      15                                         +----------+--------+--------+--------+------------------+--------+ ECA       241     17      >50%    heterogenous               +----------+--------+--------+--------+------------------+--------+ +----------+--------+-------+----------------+-------------------+           PSV cm/sEDV cmsDescribe        Arm Pressure (mmHG) +----------+--------+-------+----------------+-------------------+ [id number]            Multiphasic, WNL                     +----------+--------+-------+----------------+-------------------+ +---------+--------+--+--------+--+---------+ VertebralPSV cm/s68EDV cm/s14Antegrade +---------+--------+--+--------+--+---------+  Left Carotid Findings: +----------+--------+--------+--------+------------------+--------+           PSV cm/sEDV cm/sStenosisPlaque DescriptionComments +----------+--------+--------+--------+------------------+--------+ CCA Prox  72      12      <50%    calcific                   +----------+--------+--------+--------+------------------+--------+ CCA Distal90      21                                         +----------+--------+--------+--------+------------------+--------+ ICA Prox  85      12      1-39%   heterogenous               +----------+--------+--------+--------+------------------+--------+ ICA Distal64      17                                         +----------+--------+--------+--------+------------------+--------+ ECA       353             >50%    heterogenous               +----------+--------+--------+--------+------------------+--------+ +----------+--------+--------+----------------+-------------------+           PSV cm/sEDV cm/sDescribe        Arm Pressure (mmHG) +----------+--------+--------+----------------+-------------------+ [id number]             Multiphasic, WNL                    +----------+--------+--------+----------------+-------------------+ +---------+--------+--+--------+--+---------+  VertebralPSV cm/s72EDV cm/s15Antegrade +---------+--------+--+--------+--+---------+   Summary: Right Carotid: Velocities in the right ICA are consistent with a 1-39% stenosis.                Non-hemodynamically significant plaque <50% noted in the CCA. The                ECA appears >50% stenosed. Left Carotid: Velocities in the left ICA are consistent with a 1-39% stenosis.               Non-hemodynamically significant plaque <50%  noted in the CCA. The               ECA appears >50% stenosed. Vertebrals:  Bilateral vertebral arteries demonstrate antegrade flow. Subclavians: Normal flow hemodynamics were seen in bilateral subclavian              arteries. *See table(s) above for measurements and observations.  Electronically signed by Delia Heady MD on 07/29/2022 at 6:56:52 AM.    Final    DG Abd Portable 1V  Result Date: 07/28/2022 CLINICAL DATA:  Feeding tube placement EXAM: PORTABLE ABDOMEN - 1 VIEW COMPARISON:  None Available. FINDINGS: Feeding tube tip is in the distal stomach. Mild left basilar atelectasis. Limited evaluation of the right hemithorax due to patient arm position. Visualized portions of the abdomen are unremarkable. IMPRESSION: Feeding tube tip is in the distal stomach. Electronically Signed   By: Allegra Lai M.D.   On: 07/28/2022 14:26   CT ANGIO HEAD NECK W WO CM  Result Date: 07/27/2022 CLINICAL DATA:  Stroke/TIA, determine embolic source. Small acute cerebellar infarcts on MRI. EXAM: CT ANGIOGRAPHY HEAD AND NECK WITH AND WITHOUT CONTRAST TECHNIQUE: Multidetector CT imaging of the head and neck was performed using the standard protocol during bolus administration of intravenous contrast. Multiplanar CT image reconstructions and MIPs were obtained to evaluate the vascular anatomy. Carotid stenosis measurements (when applicable) are obtained utilizing NASCET criteria, using the distal internal carotid diameter as the denominator. RADIATION DOSE REDUCTION: This exam was performed according to the departmental dose-optimization program which includes automated exposure control, adjustment of the mA and/or kV according to patient size and/or use of iterative reconstruction technique. CONTRAST:  65mL OMNIPAQUE IOHEXOL 350 MG/ML SOLN COMPARISON:  Head CT and MRI 07/26/2022 FINDINGS: CT HEAD FINDINGS Brain: The punctate acute cerebellar infarcts on MRI are acute by CT. Small chronic bilateral cerebellar infarcts are  again noted. No acute large territory infarct, intracranial hemorrhage, mass, midline shift, or extra-axial fluid collection is identified. Patchy hypodensities in the cerebral white matter are nonspecific but compatible with moderate chronic small vessel ischemic disease. Mild cerebral atrophy is within normal limits for age. Vascular: Calcified atherosclerosis at the skull base. Skull: No fracture or suspicious osseous lesion. Sinuses/Orbits: Mild left fronto ethmoid mucosal thickening. Clear mastoid air cells. Unremarkable orbits. Other: None. Review of the MIP images confirms the above findings CTA NECK FINDINGS Aortic arch: Standard 3 vessel aortic arch with mild atherosclerotic plaque. No significant stenosis of the arch vessel origins. Right carotid system: Patent with mixed calcified and soft plaque at the carotid bifurcation resulting in 50% stenosis of the ICA origin. Left carotid system: Patent with predominantly calcified plaque scattered throughout the mid to distal common carotid artery and in the carotid bulb. No evidence of a significant stenosis. Vertebral arteries: Patent with the right being dominant. Calcified plaque at the vertebral origins resulting in mild stenosis bilaterally. Skeleton: No suspicious osseous lesion. Other neck: No evidence of cervical lymphadenopathy  or mass. History of thyroidectomy. Upper chest: The included lung apices are clear. Review of the MIP images confirms the above findings CTA HEAD FINDINGS Anterior circulation: The internal carotid arteries are patent from skull base to carotid termini with atherosclerotic calcification resulting in up to mild siphon stenosis on the right. The right ICA is congenitally smaller than the left due to hypoplasia of the right A1 segment. ACAs and MCAs are patent without evidence of a proximal branch occlusion or significant proximal stenosis. No aneurysm is identified. Posterior circulation: The intracranial vertebral arteries are  patent to the basilar with the right being moderately dominant. Patent PICA and SCA origins are visualized bilaterally. The basilar artery is widely patent. Posterior communicating arteries are diminutive or absent. PCAs are patent without evidence of a significant proximal stenosis. No aneurysm is identified. Venous sinuses: Hypoplastic left transverse and sigmoid sinuses which are incompletely opacified, potentially due to slower flow and contrast timing. Other dural venous sinuses are patent. Anatomic variants: Hypoplastic right A1 segment. Review of the MIP images confirms the above findings IMPRESSION: 1. Mild intracranial atherosclerosis without a large vessel occlusion or significant proximal stenosis. 2. 50% right ICA origin stenosis. 3. Mild bilateral vertebral artery origin stenoses. 4.  Aortic Atherosclerosis (ICD10-I70.0). Electronically Signed   By: Sebastian Ache M.D.   On: 07/27/2022 16:17   ECHOCARDIOGRAM COMPLETE  Result Date: 07/27/2022    ECHOCARDIOGRAM REPORT   Patient Name:   Kim Mills Date of Exam: 07/27/2022 Medical Rec #:  [id number]        Height:       64.0 in Accession #:    [id number]       Weight:       175.0 lb Date of Birth:  19-May-1940       BSA:          1.849 m Patient Age:    59 years         BP:           155/32 mmHg Patient Gender: F                HR:           89 bpm. Exam Location:  Inpatient Procedure: 2D Echo, Cardiac Doppler, Color Doppler and Intracardiac            Opacification Agent Indications:   Stroke  History:       Patient has no prior history of Echocardiogram examinations.                Stroke.  Sonographer:   Darlys Gales Referring      [id number] CORTNEY E DE LA TORRE Phys: IMPRESSIONS  1. Left ventricular ejection fraction, by estimation, is 60 to 65%. The left ventricle has normal function. The left ventricle has no regional wall motion abnormalities. Left ventricular diastolic parameters are consistent with Grade I diastolic dysfunction (impaired  relaxation).  2. Right ventricular systolic function is normal. The right ventricular size is normal. Tricuspid regurgitation signal is inadequate for assessing PA pressure.  3. The mitral valve is normal in structure. No evidence of mitral valve regurgitation. No evidence of mitral stenosis.  4. The aortic valve is tricuspid. Aortic valve regurgitation is not visualized. No aortic stenosis is present.  5. The inferior vena cava is normal in size with greater than 50% respiratory variability, suggesting right atrial pressure of 3 mmHg. FINDINGS  Left Ventricle: Left ventricular ejection fraction, by estimation, is 60 to 65%.  The left ventricle has normal function. The left ventricle has no regional wall motion abnormalities. Definity contrast agent was given IV to delineate the left ventricular  endocardial borders. The left ventricular internal cavity size was normal in size. There is no left ventricular hypertrophy. Left ventricular diastolic parameters are consistent with Grade I diastolic dysfunction (impaired relaxation). Normal left ventricular filling pressure. Right Ventricle: The right ventricular size is normal. No increase in right ventricular wall thickness. Right ventricular systolic function is normal. Tricuspid regurgitation signal is inadequate for assessing PA pressure. Left Atrium: Left atrial size was normal in size. Right Atrium: Right atrial size was normal in size. Pericardium: There is no evidence of pericardial effusion. Mitral Valve: The mitral valve is normal in structure. No evidence of mitral valve regurgitation. No evidence of mitral valve stenosis. Tricuspid Valve: The tricuspid valve is normal in structure. Tricuspid valve regurgitation is not demonstrated. No evidence of tricuspid stenosis. Aortic Valve: The aortic valve is tricuspid. Aortic valve regurgitation is not visualized. No aortic stenosis is present. Aortic valve peak gradient measures 6.7 mmHg. Pulmonic Valve: The pulmonic  valve was not well visualized. Pulmonic valve regurgitation is not visualized. No evidence of pulmonic stenosis. Aorta: The aortic root is normal in size and structure. Venous: The inferior vena cava is normal in size with greater than 50% respiratory variability, suggesting right atrial pressure of 3 mmHg. IAS/Shunts: No atrial level shunt detected by color flow Doppler.  LEFT VENTRICLE PLAX 2D LVIDd:         3.70 cm   Diastology LVIDs:         2.10 cm   LV e' lateral:   5.44 cm/s LV PW:         1.10 cm   LV E/e' lateral: 8.3 LV IVS:        1.10 cm LVOT diam:     2.00 cm LVOT Area:     3.14 cm  LEFT ATRIUM           Index        RIGHT ATRIUM          Index LA Vol (A4C): 27.2 ml 14.71 ml/m  RA Area:     9.76 cm                                    RA Volume:   21.80 ml 11.79 ml/m  AORTIC VALVE AV Area (Vmax): 2.15 cm AV Vmax:        129.00 cm/s AV Peak Grad:   6.7 mmHg LVOT Vmax:      88.30 cm/s  AORTA Ao Root diam: 3.10 cm MITRAL VALVE MV Area (PHT): 2.90 cm    SHUNTS MV Decel Time: 262 msec    Systemic Diam: 2.00 cm MV E velocity: 45.00 cm/s MV A velocity: 74.60 cm/s MV E/A ratio:  0.60 Mihai Croitoru MD Electronically signed by Thurmon Fair MD Signature Date/Time: 07/27/2022/3:00:57 PM    Final    MR Brain W and Wo Contrast  Result Date: 07/26/2022 CLINICAL DATA:  Acute neurologic deficit EXAM: MRI HEAD WITHOUT AND WITH CONTRAST TECHNIQUE: Multiplanar, multiecho pulse sequences of the brain and surrounding structures were obtained without and with intravenous contrast. CONTRAST:  7mL GADAVIST GADOBUTROL 1 MMOL/ML IV SOLN COMPARISON:  None Available. FINDINGS: Brain: There are punctate foci of abnormal diffusion restriction within both cerebellar hemispheres. There are multiple old cerebellar infarcts. No acute  or chronic hemorrhage. There is multifocal hyperintense T2-weighted signal within the white matter. Parenchymal volume and CSF spaces are normal. A partially empty sella is incidentally noted.  Vascular: Major flow voids are preserved. Skull and upper cervical spine: Normal calvarium and skull base. Visualized upper cervical spine and soft tissues are normal. Sinuses/Orbits:No paranasal sinus fluid levels or advanced mucosal thickening. No mastoid or middle ear effusion. Normal orbits. IMPRESSION: 1. Punctate foci of acute ischemia within both cerebellar hemispheres. No hemorrhage or mass effect. 2. Multiple old cerebellar infarcts and findings of chronic small vessel ischemia. Electronically Signed   By: Deatra Robinson M.D.   On: 07/26/2022 20:30   EEG adult  Result Date: 07/26/2022 Charlsie Quest, MD     07/26/2022  4:05 PM Patient Name: Kim Mills MRN: [id number] Epilepsy Attending: Charlsie Quest Referring Physician/Provider: Shon Hale, MD Date: 07/26/2022 Duration: 23.39 mins Patient history: 82 yo F w a PMHX of MM, UTI, HLD, HTN, who presents to the ED with the subacute onset of becoming total-care, not speaking, UTI. Baseline wheelchair bound. Had the acute onset of partial R gaze. EEG to evaluate for seizure Level of alertness: Awake AEDs during EEG study: None Technical aspects: This EEG study was done with scalp electrodes positioned according to the 10-20 International system of electrode placement. Electrical activity was reviewed with band pass filter of 1-70Hz , sensitivity of 7 uV/mm, display speed of 70mm/sec with a 60Hz  notched filter applied as appropriate. EEG data were recorded continuously and digitally stored.  Video monitoring was available and reviewed as appropriate. Description: No clear posterior dominant rhythm was seen. EEG showed continuous generalized predominantly 5-8hz  theta-alpha activity admixed with intermittent 2-3hz  delta slowing. Hyperventilation and photic stimulation were not performed.  Parts of study were difficult to interpret due to significant myogenic artifact.  ABNORMALITY - Continuous slow, generalized IMPRESSION: This technically difficult  study is suggestive of moderate diffuse encephalopathy, nonspecific etiology. No seizures or epileptiform discharges were seen throughout the recording. Charlsie Quest   DG Chest Port 1 View  Result Date: 07/26/2022 CLINICAL DATA:  Provided history: Questionable sepsis-evaluate for abnormality. EXAM: PORTABLE CHEST 1 VIEW COMPARISON:  Prior chest radiographs 07/13/2022 and earlier. FINDINGS: Heart size within normal limits. Aortic atherosclerosis. Ill-defined opacity within the left lung base, more conspicuous as compared to the prior examination of 07/13/2022. Subtle ill-defined opacity also present within the lateral aspect of the mid right lung, new from the prior exam. No evidence of pleural effusion or pneumothorax. No acute osseous abnormality identified. Degenerative changes of the spine. IMPRESSION: 1. Ill-defined opacities within the left lung base and mid right lung, which may reflect atelectasis or pneumonia. 2.  Aortic Atherosclerosis (ICD10-I70.0). Electronically Signed   By: Jackey Loge D.O.   On: 07/26/2022 08:47   CT HEAD CODE STROKE WO CONTRAST  Result Date: 07/26/2022 CLINICAL DATA:  Code stroke. 82 year old female with altered mental status. Obtunded. EXAM: CT HEAD WITHOUT CONTRAST TECHNIQUE: Contiguous axial images were obtained from the base of the skull through the vertex without intravenous contrast. RADIATION DOSE REDUCTION: This exam was performed according to the departmental dose-optimization program which includes automated exposure control, adjustment of the mA and/or kV according to patient size and/or use of iterative reconstruction technique. COMPARISON:  Head CT 03/26/2020. FINDINGS: Brain: Cerebral volume is stable, within normal limits for age. No midline shift, mass effect, or evidence of intracranial mass lesion. No ventriculomegaly. No acute intracranial hemorrhage identified. Linear chronic appearing small infarct in the right  cerebellar hemisphere series 2, image 8  is new since Aug 19, 2020.a but nearby punctate appearing chronic right cerebellar infarcts are stable (image 7). Patchy white matter hypodensity appears stable since August 19, 2020, most pronounced in the left corona radiata as before. No cortically based acute infarct identified. And otherwise stable gray-white differentiation. Vascular: No suspicious intracranial vascular hyperdensity. Calcified atherosclerosis at the skull base. Skull: Mild motion artifact. No acute osseous abnormality identified. Sinuses/Orbits: Mild left frontoethmoidal recess mucosal thickening is new. Otherwise Visualized paranasal sinuses and mastoids are stable and well aerated. Other: Slight rightward gaze. Visualized scalp soft tissues are within normal limits. ASPECTS [facility] Stroke Program Early CT Score) Total score (0-10 with 10 being normal): 10 IMPRESSION: 1. No acute cortically based infarct or acute intracranial hemorrhage identified. ASPECTS 10. 2. 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 Study discussed by telephone with Dr. Gloris Manchester on 07/26/2022 at 07:39 . Electronically Signed   By: Odessa Fleming M.D.   On: 07/26/2022 07:39   DG Chest 2 View  Result Date: 07/13/2022 CLINICAL DATA:  Cough, chills, shortness of breath EXAM: CHEST - 2 VIEW COMPARISON:  06/01/2022 FINDINGS: Low lung volumes. Heart and mediastinal contours within normal limits. Aortic atherosclerosis. Left basilar opacity again noted, similar to prior study. No confluent opacity on the right. No effusions or acute bony abnormality. IMPRESSION: Low lung volumes. Left basilar atelectasis or infiltrate, similar to prior study. Electronically Signed   By: Charlett Nose M.D.   On: 07/13/2022 12:39    Microbiology: Results for orders placed or performed during the hospital encounter of 07/26/22  Urine Culture     Status: Abnormal   Collection Time: 07/26/22  8:19 AM   Specimen: Urine, Clean Catch  Result Value Ref Range Status   Specimen Description    Final    URINE, CLEAN CATCH Performed at [facility], [address] [place], [place] [address]    Special Requests   Final    NONE Performed at [facility], [address], [place], [place] [address]    Culture >=100,000 COLONIES/mL ESCHERICHIA COLI (A)  Final   Report Status 07/28/2022 FINAL  Final   Organism ID, Bacteria ESCHERICHIA COLI (A)  Final      Susceptibility   Escherichia coli - MIC*    AMPICILLIN >=32 RESISTANT Resistant     CEFAZOLIN <=4 SENSITIVE Sensitive     CEFEPIME <=0.12 SENSITIVE Sensitive     CEFTRIAXONE <=0.25 SENSITIVE Sensitive     CIPROFLOXACIN <=0.25 SENSITIVE Sensitive     GENTAMICIN <=1 SENSITIVE Sensitive     IMIPENEM <=0.25 SENSITIVE Sensitive     NITROFURANTOIN <=16 SENSITIVE Sensitive     TRIMETH/SULFA <=20 SENSITIVE Sensitive     AMPICILLIN/SULBACTAM >=32 RESISTANT Resistant     PIP/TAZO <=4 SENSITIVE Sensitive     * >=100,000 COLONIES/mL ESCHERICHIA COLI  Blood Culture (routine x 2)     Status: None   Collection Time: 07/26/22  8:59 AM   Specimen: BLOOD LEFT HAND  Result Value Ref Range Status   Specimen Description BLOOD LEFT HAND  Final   Special Requests   Final    BOTTLES DRAWN AEROBIC ONLY Blood Culture results may not be optimal due to an inadequate volume of blood received in culture bottles   Culture   Final    NO GROWTH 5 DAYS Performed at [facility], [address] [place], [place] [address]    Report Status 07/31/2022 FINAL  Final  Blood Culture (routine x 2)  Status: None   Collection Time: 07/26/22  8:59 AM   Specimen: BLOOD LEFT WRIST  Result Value Ref Range Status   Specimen Description BLOOD LEFT WRIST  Final   Special Requests   Final    BOTTLES DRAWN AEROBIC ONLY Blood Culture adequate volume   Culture   Final    NO GROWTH 5 DAYS Performed at [facility], [address] [place], [place] [address]    Report Status 07/31/2022 FINAL  Final    Labs: CBC: Recent Labs  Lab 07/28/22 0208 07/29/22 0411  07/30/22 0458  WBC 6.5 7.3 6.8  HGB 8.4* 9.5* 7.9*  HCT 26.2* 29.9* 24.3*  MCV 90.0 93.1 90.0  PLT 152 222 220   Basic Metabolic Panel: Recent Labs  Lab 07/28/22 0208 07/29/22 0411 07/30/22 0458 07/31/22 0441  NA 134* 133* 136 138  K 3.9 4.6 4.3 4.0  CL 109 109 113* 111  CO2 17* 16* 16* 19*  GLUCOSE 220* 217* 243* 248*  BUN 7* 10 15 16   CREATININE 1.00 1.07* 0.99 0.93  CALCIUM 7.3* 7.7* 8.0* 8.3*  MG  --  1.3* 1.6* 2.0  PHOS  --  1.7* 3.0 3.1   Liver Function Tests: Recent Labs  Lab 07/28/22 0208  AST 19  ALT 25  ALKPHOS 66  BILITOT 0.3  PROT 5.5*  ALBUMIN 1.9*   CBG: Recent Labs  Lab 08/01/22 1934 08/01/22 2308 08/02/22 0308 08/02/22 0816 08/02/22 1257  GLUCAP 228* 238* 259* 284* 293*    Discharge time spent: greater than 30 minutes.  Signed: Thad Ranger, MD [facility] 08/03/2022

## 2022-08-03 NOTE — Progress Notes (Signed)
Daily Progress Note   Patient Name: Kim Mills       Date: 08/03/2022 DOB: 01/09/1941  Age: 82 y.o. MRN#: [id number] Attending Physician: Cathren Harsh, MD Primary Care Physician: [name], MD Admit Date: 07/26/2022  Reason for Consultation/Follow-up: Establishing goals of care  Subjective: Unresponsive during my assessment, daughter [name] at bedside  Length of Stay: 7  Current Medications: Scheduled Meds:   leptospermum manuka honey  1 Application Topical Daily   sodium chloride flush  3 mL Intravenous Q12H    Continuous Infusions:    PRN Meds: albuterol, bisacodyl, Gerhardt's butt cream, LORazepam, morphine injection, morphine CONCENTRATE, [DISCONTINUED] ondansetron **OR** ondansetron (ZOFRAN) IV  Physical Exam Constitutional:      General: She is not in acute distress.    Appearance: She is ill-appearing.     Comments: Unrseponsive No s/s discomfort- no grimacing, face relaxed  Pulmonary:     Effort: Pulmonary effort is normal.     Comments: Breathing even and regular Abdominal:     Palpations: Abdomen is soft.  Musculoskeletal:     Right lower leg: No edema.     Left lower leg: No edema.  Skin:    General: Skin is warm and dry.             Vital Signs: BP (!) 151/103 (BP Location: Left Arm)   Pulse 95   Temp 98.9 F (37.2 C) (Axillary)   Resp 18   Ht 5\' 4"  (1.626 m)   Wt 77.9 kg   SpO2 95%   BMI 29.48 kg/m  SpO2: SpO2: 95 % O2 Device: O2 Device: Room Air O2 Flow Rate:    Intake/output summary:  Intake/Output Summary (Last 24 hours) at 08/03/2022 1053 Last data filed at 08/02/2022 2041 Gross per 24 hour  Intake --  Output 400 ml  Net -400 ml    LBM: Last BM Date : 08/02/22 Baseline Weight: Weight: 79.4 kg Most recent weight: Weight: 77.9  kg   Patient Active Problem List   Diagnosis Date Noted   UTI (urinary tract infection) 07/28/2022   Pressure injury of skin 07/27/2022   Hypoglycemia 07/27/2022   CAP (community acquired pneumonia) 07/27/2022   Hyponatremia 07/27/2022   Malnutrition of moderate degree 07/27/2022   Acute metabolic encephalopathy 07/26/2022   Generalized weakness 09/24/2020   Overactive bladder 05/13/2020   Multiple myeloma (HCC) 10/13/2019   Goals of care, counseling/discussion 10/13/2019   Multiple myeloma not having achieved remission (HCC) 10/13/2019   Monoclonal gammopathy 09/12/2019   CKD stage 3a, GFR 45-59 ml/min (HCC) 04/18/2019   Vitamin D deficiency 04/17/2019   B12 deficiency 04/12/2018   Normocytic anemia 04/12/2018   Carotid artery disease without cerebral infarction (HCC) 04/12/2018   Gait disorder 01/11/2018   Diabetic neuropathy (HCC) 01/10/2018   CAD (coronary artery disease) 11/23/2017   SVT (supraventricular tachycardia) 10/05/2017   History of colonic polyps    GERD (gastroesophageal reflux disease) 03/13/2014   Hyperlipidemia    Type 2 diabetes mellitus with diabetic neuropathy, unspecified (HCC)    OA (osteoarthritis) of knee    Hypothyroidism    Hypertension     Palliative Care Assessment & Plan   HPI: 82 y.o.  female  with past medical history of MM on Revlimid, DM, obesity, HTN and hypothyroidism  admitted on 07/26/2022 with hypoglycemia and confusion. UA and CXR consistent with infection, antibiotics started. CT revealed cerebellar infarcts; per neurology, likely incidental finding.  EEG negative. Patient with ongoing poor MS and poor PO intake. PMT consulted to discuss GOC.   Assessment: Patient now comfort measures only with plans to discharge home with hospice today. Appears comfortable - received morphine prior to my arrival.  Daughter [name] at bedside - [name] has questions about hospice care at home. Discussed having hospice liaison call her to address all  questions and concerns.  Spoke with [facility] [place] [facility] hospice liaison - they will call [name] soon and address questions.   Recommendations/Plan: Comfort measures only, home with hospice later today  Code Status: DNR  Care plan was discussed with daughter [name] and [place] hospice liaison  Thank you for allowing the Palliative Medicine Team to assist in the care of this patient.   *Please note that this is a verbal dictation therefore any spelling or grammatical errors are due to the "Dragon Medical One" system interpretation.  Gerlean Ren, DNP, [facility] Palliative Medicine Team Team Phone # [phone]  Pager [phone]

## 2022-08-03 NOTE — Plan of Care (Signed)
Problem: Education: Goal: Knowledge of General Education information will improve Description: Including pain rating scale, medication(s)/side effects and non-pharmacologic comfort measures Outcome: Adequate for Discharge   Problem: Health Behavior/Discharge Planning: Goal: Ability to manage health-related needs will improve Outcome: Adequate for Discharge   Problem: Clinical Measurements: Goal: Ability to maintain clinical measurements within normal limits will improve Outcome: Adequate for Discharge Goal: Will remain free from infection Outcome: Adequate for Discharge Goal: Diagnostic test results will improve Outcome: Adequate for Discharge Goal: Respiratory complications will improve Outcome: Adequate for Discharge Goal: Cardiovascular complication will be avoided Outcome: Adequate for Discharge   Problem: Activity: Goal: Risk for activity intolerance will decrease Outcome: Adequate for Discharge   Problem: Nutrition: Goal: Adequate nutrition will be maintained Outcome: Adequate for Discharge   Problem: Coping: Goal: Level of anxiety will decrease Outcome: Adequate for Discharge   Problem: Elimination: Goal: Will not experience complications related to bowel motility Outcome: Adequate for Discharge Goal: Will not experience complications related to urinary retention Outcome: Adequate for Discharge   Problem: Pain Managment: Goal: General experience of comfort will improve Outcome: Adequate for Discharge   Problem: Safety: Goal: Ability to remain free from injury will improve Outcome: Adequate for Discharge   Problem: Skin Integrity: Goal: Risk for impaired skin integrity will decrease Outcome: Adequate for Discharge   Problem: Education: Goal: Ability to describe self-care measures that may prevent or decrease complications (Diabetes Survival Skills Education) will improve Outcome: Adequate for Discharge Goal: Individualized Educational Video(s) Outcome:  Adequate for Discharge   Problem: Coping: Goal: Ability to adjust to condition or change in health will improve Outcome: Adequate for Discharge   Problem: Fluid Volume: Goal: Ability to maintain a balanced intake and output will improve Outcome: Adequate for Discharge   Problem: Health Behavior/Discharge Planning: Goal: Ability to identify and utilize available resources and services will improve Outcome: Adequate for Discharge Goal: Ability to manage health-related needs will improve Outcome: Adequate for Discharge   Problem: Metabolic: Goal: Ability to maintain appropriate glucose levels will improve Outcome: Adequate for Discharge   Problem: Nutritional: Goal: Maintenance of adequate nutrition will improve Outcome: Adequate for Discharge Goal: Progress toward achieving an optimal weight will improve Outcome: Adequate for Discharge   Problem: Skin Integrity: Goal: Risk for impaired skin integrity will decrease Outcome: Adequate for Discharge   Problem: Tissue Perfusion: Goal: Adequacy of tissue perfusion will improve Outcome: Adequate for Discharge   Problem: Education: Goal: Knowledge of disease or condition will improve Outcome: Adequate for Discharge Goal: Knowledge of secondary prevention will improve (MUST DOCUMENT ALL) Outcome: Adequate for Discharge Goal: Knowledge of patient specific risk factors will improve (Mark N/A or DELETE if not current risk factor) Outcome: Adequate for Discharge   Problem: Ischemic Stroke/TIA Tissue Perfusion: Goal: Complications of ischemic stroke/TIA will be minimized Outcome: Adequate for Discharge   Problem: Coping: Goal: Will verbalize positive feelings about self Outcome: Adequate for Discharge Goal: Will identify appropriate support needs Outcome: Adequate for Discharge   Problem: Health Behavior/Discharge Planning: Goal: Ability to manage health-related needs will improve Outcome: Adequate for Discharge Goal: Goals  will be collaboratively established with patient/family Outcome: Adequate for Discharge   Problem: Self-Care: Goal: Ability to participate in self-care as condition permits will improve Outcome: Adequate for Discharge Goal: Verbalization of feelings and concerns over difficulty with self-care will improve Outcome: Adequate for Discharge Goal: Ability to communicate needs accurately will improve Outcome: Adequate for Discharge   Problem: Nutrition: Goal: Risk of aspiration will decrease Outcome: Adequate for Discharge   Goal: Dietary intake will improve Outcome: Adequate for Discharge   

## 2022-08-03 NOTE — TOC Transition Note (Signed)
Transition of Care [facility]) - CM/SW Discharge Note   Patient Details  Name: Kim Mills MRN: [id number] Date of Birth: 08-02-1940  [facility]) CM/SW Contact:  Kermit Balo, RN Phone Number: 08/03/2022, 2:02 PM   Clinical Narrative:    Pt is discharging home with hospice services. CM has updated the hospice agency [facility] [place] [facility]) and the daughter at bedside. Medications for home have been delivered to the room. D/c packet is at the desk and bedside RN aware.    Final next level of care: Home w Hospice Care Barriers to Discharge: No Barriers Identified   Patient Goals and CMS Choice CMS Medicare.gov Compare Post Acute Care list provided to:: Patient Represenative (must comment) Choice offered to / list presented to : Adult Children  Discharge Placement                         Discharge Plan and Services Additional resources added to the After Visit Summary for     Discharge Planning Services: CM Consult Post Acute Care Choice: Hospice                      [facility] Agency: [facility] Date [facility] Agency Contacted: 08/02/22   Representative spoke with at [facility] Agency: [place]  Social Determinants of Health (SDOH) Interventions SDOH Screenings   Food Insecurity: No Food Insecurity (07/26/2022)  Housing: Medium Risk (07/26/2022)  Transportation Needs: No Transportation Needs (07/26/2022)  Utilities: Not At Risk (07/26/2022)  Alcohol Screen: Low Risk  (11/05/2020)  Depression (PHQ2-9): Medium Risk (03/29/2022)  Financial Resource Strain: Low Risk  (11/05/2020)  Physical Activity: Sufficiently Active (11/05/2020)  Social Connections: Moderately Integrated (11/05/2020)  Stress: No Stress Concern Present (11/05/2020)  Tobacco Use: Medium Risk (07/26/2022)     Readmission Risk Interventions     No data to display

## 2022-08-04 DIAGNOSIS — E440 Moderate protein-calorie malnutrition: Secondary | ICD-10-CM | POA: Diagnosis not present

## 2022-08-04 DIAGNOSIS — E162 Hypoglycemia, unspecified: Secondary | ICD-10-CM | POA: Diagnosis not present

## 2022-08-04 DIAGNOSIS — D649 Anemia, unspecified: Secondary | ICD-10-CM | POA: Diagnosis not present

## 2022-08-04 DIAGNOSIS — E039 Hypothyroidism, unspecified: Secondary | ICD-10-CM | POA: Diagnosis not present

## 2022-08-04 DIAGNOSIS — M179 Osteoarthritis of knee, unspecified: Secondary | ICD-10-CM | POA: Diagnosis not present

## 2022-08-04 DIAGNOSIS — N1831 Chronic kidney disease, stage 3a: Secondary | ICD-10-CM | POA: Diagnosis not present

## 2022-08-04 DIAGNOSIS — C9000 Multiple myeloma not having achieved remission: Secondary | ICD-10-CM | POA: Diagnosis not present

## 2022-08-04 DIAGNOSIS — J189 Pneumonia, unspecified organism: Secondary | ICD-10-CM | POA: Diagnosis not present

## 2022-08-04 DIAGNOSIS — D472 Monoclonal gammopathy: Secondary | ICD-10-CM | POA: Diagnosis not present

## 2022-08-04 DIAGNOSIS — N390 Urinary tract infection, site not specified: Secondary | ICD-10-CM | POA: Diagnosis not present

## 2022-08-04 DIAGNOSIS — L8990 Pressure ulcer of unspecified site, unspecified stage: Secondary | ICD-10-CM | POA: Diagnosis not present

## 2022-08-04 DIAGNOSIS — E785 Hyperlipidemia, unspecified: Secondary | ICD-10-CM | POA: Diagnosis not present

## 2022-08-04 DIAGNOSIS — I10 Essential (primary) hypertension: Secondary | ICD-10-CM | POA: Diagnosis not present

## 2022-08-04 NOTE — Care Management Important Message (Signed)
Important Message  Patient Details  Name: Kim Mills MRN: [id number] Date of Birth: [birth year]   Medicare Important Message Given:  Yes  Patient left prior to IM delivery will send copy  to patient home address   Dorena Bodo 08/04/2022, 7:58 AM

## 2022-08-05 ENCOUNTER — Inpatient Hospital Stay: Payer: Medicare Other

## 2022-08-05 ENCOUNTER — Inpatient Hospital Stay: Payer: Medicare Other | Admitting: Hematology

## 2022-08-05 DIAGNOSIS — E162 Hypoglycemia, unspecified: Secondary | ICD-10-CM | POA: Diagnosis not present

## 2022-08-05 DIAGNOSIS — L8990 Pressure ulcer of unspecified site, unspecified stage: Secondary | ICD-10-CM | POA: Diagnosis not present

## 2022-08-05 DIAGNOSIS — J189 Pneumonia, unspecified organism: Secondary | ICD-10-CM | POA: Diagnosis not present

## 2022-08-05 DIAGNOSIS — N390 Urinary tract infection, site not specified: Secondary | ICD-10-CM | POA: Diagnosis not present

## 2022-08-05 DIAGNOSIS — E440 Moderate protein-calorie malnutrition: Secondary | ICD-10-CM | POA: Diagnosis not present

## 2022-08-05 DIAGNOSIS — C9000 Multiple myeloma not having achieved remission: Secondary | ICD-10-CM | POA: Diagnosis not present

## 2022-08-06 DIAGNOSIS — C9000 Multiple myeloma not having achieved remission: Secondary | ICD-10-CM | POA: Diagnosis not present

## 2022-08-06 DIAGNOSIS — L8990 Pressure ulcer of unspecified site, unspecified stage: Secondary | ICD-10-CM | POA: Diagnosis not present

## 2022-08-06 DIAGNOSIS — J189 Pneumonia, unspecified organism: Secondary | ICD-10-CM | POA: Diagnosis not present

## 2022-08-06 DIAGNOSIS — E440 Moderate protein-calorie malnutrition: Secondary | ICD-10-CM | POA: Diagnosis not present

## 2022-08-06 DIAGNOSIS — N390 Urinary tract infection, site not specified: Secondary | ICD-10-CM | POA: Diagnosis not present

## 2022-08-06 DIAGNOSIS — E162 Hypoglycemia, unspecified: Secondary | ICD-10-CM | POA: Diagnosis not present

## 2022-08-07 DIAGNOSIS — J189 Pneumonia, unspecified organism: Secondary | ICD-10-CM | POA: Diagnosis not present

## 2022-08-07 DIAGNOSIS — L8990 Pressure ulcer of unspecified site, unspecified stage: Secondary | ICD-10-CM | POA: Diagnosis not present

## 2022-08-07 DIAGNOSIS — E162 Hypoglycemia, unspecified: Secondary | ICD-10-CM | POA: Diagnosis not present

## 2022-08-07 DIAGNOSIS — N390 Urinary tract infection, site not specified: Secondary | ICD-10-CM | POA: Diagnosis not present

## 2022-08-07 DIAGNOSIS — C9000 Multiple myeloma not having achieved remission: Secondary | ICD-10-CM | POA: Diagnosis not present

## 2022-08-07 DIAGNOSIS — E440 Moderate protein-calorie malnutrition: Secondary | ICD-10-CM | POA: Diagnosis not present

## 2022-08-08 DIAGNOSIS — N390 Urinary tract infection, site not specified: Secondary | ICD-10-CM | POA: Diagnosis not present

## 2022-08-08 DIAGNOSIS — L8990 Pressure ulcer of unspecified site, unspecified stage: Secondary | ICD-10-CM | POA: Diagnosis not present

## 2022-08-08 DIAGNOSIS — E162 Hypoglycemia, unspecified: Secondary | ICD-10-CM | POA: Diagnosis not present

## 2022-08-08 DIAGNOSIS — C9000 Multiple myeloma not having achieved remission: Secondary | ICD-10-CM | POA: Diagnosis not present

## 2022-08-08 DIAGNOSIS — J189 Pneumonia, unspecified organism: Secondary | ICD-10-CM | POA: Diagnosis not present

## 2022-08-08 DIAGNOSIS — E440 Moderate protein-calorie malnutrition: Secondary | ICD-10-CM | POA: Diagnosis not present

## 2022-08-09 DIAGNOSIS — I10 Essential (primary) hypertension: Secondary | ICD-10-CM | POA: Diagnosis not present

## 2022-08-09 DIAGNOSIS — D649 Anemia, unspecified: Secondary | ICD-10-CM | POA: Diagnosis not present

## 2022-08-09 DIAGNOSIS — N1831 Chronic kidney disease, stage 3a: Secondary | ICD-10-CM | POA: Diagnosis not present

## 2022-08-09 DIAGNOSIS — E440 Moderate protein-calorie malnutrition: Secondary | ICD-10-CM | POA: Diagnosis not present

## 2022-08-09 DIAGNOSIS — L8990 Pressure ulcer of unspecified site, unspecified stage: Secondary | ICD-10-CM | POA: Diagnosis not present

## 2022-08-09 DIAGNOSIS — D472 Monoclonal gammopathy: Secondary | ICD-10-CM | POA: Diagnosis not present

## 2022-08-09 DIAGNOSIS — M179 Osteoarthritis of knee, unspecified: Secondary | ICD-10-CM | POA: Diagnosis not present

## 2022-08-09 DIAGNOSIS — E039 Hypothyroidism, unspecified: Secondary | ICD-10-CM | POA: Diagnosis not present

## 2022-08-09 DIAGNOSIS — C9000 Multiple myeloma not having achieved remission: Secondary | ICD-10-CM | POA: Diagnosis not present

## 2022-08-09 DIAGNOSIS — J189 Pneumonia, unspecified organism: Secondary | ICD-10-CM | POA: Diagnosis not present

## 2022-08-09 DIAGNOSIS — N390 Urinary tract infection, site not specified: Secondary | ICD-10-CM | POA: Diagnosis not present

## 2022-08-09 DIAGNOSIS — E162 Hypoglycemia, unspecified: Secondary | ICD-10-CM | POA: Diagnosis not present

## 2022-08-09 DIAGNOSIS — E785 Hyperlipidemia, unspecified: Secondary | ICD-10-CM | POA: Diagnosis not present

## 2022-08-16 ENCOUNTER — Inpatient Hospital Stay: Payer: Medicare Other

## 2022-08-23 ENCOUNTER — Telehealth: Payer: Self-pay | Admitting: Internal Medicine

## 2022-08-23 NOTE — Telephone Encounter (Signed)
Isidoro Donning calling requesting Dr Eliane Decree signature for pt death certificate. [phone]. Thank You

## 2022-09-09 DEATH — deceased
